# Patient Record
Sex: Female | Born: 1950
Health system: Southern US, Community
[De-identification: ages and names within clinical notes are randomized; demographics above are authoritative.]

## PROBLEM LIST (undated history)

## (undated) DIAGNOSIS — Z803 Family history of malignant neoplasm of breast: Secondary | ICD-10-CM

## (undated) DIAGNOSIS — Z860100 Personal history of colon polyps, unspecified: Secondary | ICD-10-CM

## (undated) DIAGNOSIS — Z8489 Family history of other specified conditions: Secondary | ICD-10-CM

## (undated) DIAGNOSIS — Z7901 Long term (current) use of anticoagulants: Secondary | ICD-10-CM

## (undated) DIAGNOSIS — Z923 Personal history of irradiation: Secondary | ICD-10-CM

## (undated) DIAGNOSIS — I10 Essential (primary) hypertension: Secondary | ICD-10-CM

## (undated) DIAGNOSIS — I2510 Atherosclerotic heart disease of native coronary artery without angina pectoris: Secondary | ICD-10-CM

## (undated) DIAGNOSIS — C801 Malignant (primary) neoplasm, unspecified: Secondary | ICD-10-CM

## (undated) DIAGNOSIS — Z86718 Personal history of other venous thrombosis and embolism: Secondary | ICD-10-CM

## (undated) DIAGNOSIS — D649 Anemia, unspecified: Secondary | ICD-10-CM

## (undated) DIAGNOSIS — I359 Nonrheumatic aortic valve disorder, unspecified: Secondary | ICD-10-CM

## (undated) DIAGNOSIS — R011 Cardiac murmur, unspecified: Secondary | ICD-10-CM

## (undated) DIAGNOSIS — I509 Heart failure, unspecified: Secondary | ICD-10-CM

## (undated) DIAGNOSIS — E785 Hyperlipidemia, unspecified: Secondary | ICD-10-CM

## (undated) DIAGNOSIS — I739 Peripheral vascular disease, unspecified: Secondary | ICD-10-CM

## (undated) DIAGNOSIS — Z8049 Family history of malignant neoplasm of other genital organs: Secondary | ICD-10-CM

## (undated) DIAGNOSIS — M1990 Unspecified osteoarthritis, unspecified site: Secondary | ICD-10-CM

## (undated) DIAGNOSIS — Z9221 Personal history of antineoplastic chemotherapy: Secondary | ICD-10-CM

## (undated) DIAGNOSIS — I428 Other cardiomyopathies: Secondary | ICD-10-CM

## (undated) DIAGNOSIS — Z800 Family history of malignant neoplasm of digestive organs: Secondary | ICD-10-CM

## (undated) HISTORY — DX: Personal history of colon polyps, unspecified: Z86.0100

## (undated) HISTORY — DX: Family history of malignant neoplasm of digestive organs: Z80.0

## (undated) HISTORY — PX: OTHER SURGICAL HISTORY: SHX169

## (undated) HISTORY — DX: Other cardiomyopathies: I42.8

## (undated) HISTORY — DX: Hyperlipidemia, unspecified: E78.5

## (undated) HISTORY — DX: Personal history of other venous thrombosis and embolism: Z86.718

## (undated) HISTORY — DX: Personal history of colonic polyps: Z86.010

## (undated) HISTORY — DX: Nonrheumatic aortic valve disorder, unspecified: I35.9

## (undated) HISTORY — PX: DILATION AND CURETTAGE OF UTERUS: SHX78

## (undated) HISTORY — DX: Long term (current) use of anticoagulants: Z79.01

## (undated) HISTORY — DX: Family history of malignant neoplasm of other genital organs: Z80.49

## (undated) HISTORY — DX: Essential (primary) hypertension: I10

## (undated) HISTORY — PX: CARDIAC CATHETERIZATION: SHX172

## (undated) HISTORY — PX: COLON SURGERY: SHX602

## (undated) HISTORY — PX: ABDOMINAL HYSTERECTOMY: SHX81

## (undated) HISTORY — DX: Family history of malignant neoplasm of breast: Z80.3

---

## 2002-10-02 HISTORY — PX: CARDIAC CATHETERIZATION: SHX172

## 2002-12-04 ENCOUNTER — Encounter: Payer: Self-pay | Admitting: Emergency Medicine

## 2002-12-05 ENCOUNTER — Inpatient Hospital Stay (HOSPITAL_COMMUNITY): Admission: EM | Admit: 2002-12-05 | Discharge: 2002-12-07 | Payer: Self-pay | Admitting: Emergency Medicine

## 2002-12-05 ENCOUNTER — Encounter: Payer: Self-pay | Admitting: Cardiology

## 2002-12-05 ENCOUNTER — Encounter: Payer: Self-pay | Admitting: Internal Medicine

## 2002-12-15 ENCOUNTER — Encounter: Payer: Self-pay | Admitting: *Deleted

## 2002-12-15 ENCOUNTER — Encounter: Admission: RE | Admit: 2002-12-15 | Discharge: 2002-12-15 | Payer: Self-pay | Admitting: *Deleted

## 2002-12-16 ENCOUNTER — Ambulatory Visit (HOSPITAL_COMMUNITY): Admission: RE | Admit: 2002-12-16 | Discharge: 2002-12-16 | Payer: Self-pay | Admitting: *Deleted

## 2003-08-14 ENCOUNTER — Encounter: Admission: RE | Admit: 2003-08-14 | Discharge: 2003-08-14 | Payer: Self-pay | Admitting: Obstetrics and Gynecology

## 2003-12-30 ENCOUNTER — Encounter: Payer: Self-pay | Admitting: Internal Medicine

## 2004-03-11 ENCOUNTER — Encounter (INDEPENDENT_AMBULATORY_CARE_PROVIDER_SITE_OTHER): Payer: Self-pay | Admitting: *Deleted

## 2004-03-11 ENCOUNTER — Ambulatory Visit (HOSPITAL_COMMUNITY): Admission: RE | Admit: 2004-03-11 | Discharge: 2004-03-11 | Payer: Self-pay | Admitting: Gastroenterology

## 2004-11-06 ENCOUNTER — Emergency Department (HOSPITAL_COMMUNITY): Admission: EM | Admit: 2004-11-06 | Discharge: 2004-11-06 | Payer: Self-pay | Admitting: Family Medicine

## 2007-06-26 ENCOUNTER — Ambulatory Visit: Payer: Self-pay | Admitting: Internal Medicine

## 2007-06-26 DIAGNOSIS — J45909 Unspecified asthma, uncomplicated: Secondary | ICD-10-CM | POA: Insufficient documentation

## 2007-06-26 DIAGNOSIS — R519 Headache, unspecified: Secondary | ICD-10-CM | POA: Insufficient documentation

## 2007-06-26 DIAGNOSIS — R51 Headache: Secondary | ICD-10-CM

## 2007-06-26 DIAGNOSIS — I10 Essential (primary) hypertension: Secondary | ICD-10-CM

## 2007-06-26 DIAGNOSIS — Z8679 Personal history of other diseases of the circulatory system: Secondary | ICD-10-CM | POA: Insufficient documentation

## 2007-06-26 DIAGNOSIS — I509 Heart failure, unspecified: Secondary | ICD-10-CM | POA: Insufficient documentation

## 2007-06-26 DIAGNOSIS — R55 Syncope and collapse: Secondary | ICD-10-CM | POA: Insufficient documentation

## 2007-06-26 DIAGNOSIS — I110 Hypertensive heart disease with heart failure: Secondary | ICD-10-CM

## 2007-06-26 DIAGNOSIS — J309 Allergic rhinitis, unspecified: Secondary | ICD-10-CM | POA: Insufficient documentation

## 2007-06-26 DIAGNOSIS — Z86718 Personal history of other venous thrombosis and embolism: Secondary | ICD-10-CM | POA: Insufficient documentation

## 2007-07-01 ENCOUNTER — Ambulatory Visit: Payer: Self-pay | Admitting: Internal Medicine

## 2007-07-01 LAB — CONVERTED CEMR LAB
INR: 1.5
Prothrombin Time: 15.1 s

## 2007-07-03 LAB — CONVERTED CEMR LAB
BUN: 14 mg/dL (ref 6–23)
Basophils Absolute: 0 10*3/uL (ref 0.0–0.1)
Basophils Relative: 0.4 % (ref 0.0–1.0)
CO2: 29 meq/L (ref 19–32)
Calcium: 9.1 mg/dL (ref 8.4–10.5)
Chloride: 112 meq/L (ref 96–112)
Cholesterol: 247 mg/dL (ref 0–200)
Creatinine, Ser: 1.2 mg/dL (ref 0.4–1.2)
Direct LDL: 175.9 mg/dL
Eosinophils Absolute: 0.1 10*3/uL (ref 0.0–0.6)
Eosinophils Relative: 0.8 % (ref 0.0–5.0)
GFR calc Af Amer: 60 mL/min
GFR calc non Af Amer: 50 mL/min
Glucose, Bld: 89 mg/dL (ref 70–99)
HCT: 40.1 % (ref 36.0–46.0)
HDL: 43.7 mg/dL (ref >39.0)
Hemoglobin: 13.8 g/dL (ref 12.0–15.0)
Lymphocytes Relative: 51.2 % — ABNORMAL HIGH (ref 12.0–46.0)
MCHC: 34.3 g/dL (ref 30.0–36.0)
MCV: 86.4 fL (ref 78.0–100.0)
Monocytes Absolute: 0.3 10*3/uL (ref 0.2–0.7)
Monocytes Relative: 4.8 % (ref 3.0–11.0)
Neutro Abs: 3.1 10*3/uL (ref 1.4–7.7)
Neutrophils Relative %: 42.8 % — ABNORMAL LOW (ref 43.0–77.0)
Platelets: 202 10*3/uL (ref 150–400)
Potassium: 3.8 meq/L (ref 3.5–5.1)
RBC: 4.65 M/uL (ref 3.87–5.11)
RDW: 12.7 % (ref 11.5–14.6)
Sodium: 147 meq/L — ABNORMAL HIGH (ref 135–145)
TSH: 1.82 microintl units/mL (ref 0.35–5.50)
Total CHOL/HDL Ratio: 5.7
Triglycerides: 137 mg/dL (ref 0–149)
VLDL: 27 mg/dL (ref 0–40)
WBC: 7.2 10*3/uL (ref 4.5–10.5)

## 2007-07-10 ENCOUNTER — Ambulatory Visit: Payer: Self-pay | Admitting: Internal Medicine

## 2007-07-10 LAB — CONVERTED CEMR LAB
INR: 3
Prothrombin Time: 20.9 s

## 2007-07-18 ENCOUNTER — Ambulatory Visit: Payer: Self-pay | Admitting: Internal Medicine

## 2007-07-18 DIAGNOSIS — I119 Hypertensive heart disease without heart failure: Secondary | ICD-10-CM | POA: Insufficient documentation

## 2007-07-18 DIAGNOSIS — E782 Mixed hyperlipidemia: Secondary | ICD-10-CM | POA: Insufficient documentation

## 2007-07-18 DIAGNOSIS — I43 Cardiomyopathy in diseases classified elsewhere: Secondary | ICD-10-CM

## 2007-07-19 ENCOUNTER — Telehealth: Payer: Self-pay | Admitting: *Deleted

## 2007-07-24 ENCOUNTER — Ambulatory Visit: Payer: Self-pay | Admitting: Internal Medicine

## 2007-07-24 LAB — CONVERTED CEMR LAB
INR: 2.3
Prothrombin Time: 18.5 s

## 2007-07-25 ENCOUNTER — Ambulatory Visit: Payer: Self-pay

## 2007-07-25 ENCOUNTER — Encounter: Payer: Self-pay | Admitting: Internal Medicine

## 2007-07-30 ENCOUNTER — Ambulatory Visit: Payer: Self-pay | Admitting: Cardiology

## 2007-07-30 LAB — CONVERTED CEMR LAB
BUN: 13 mg/dL (ref 6–23)
CO2: 26 meq/L (ref 19–32)
Calcium: 8.9 mg/dL (ref 8.4–10.5)
Chloride: 107 meq/L (ref 96–112)
Creatinine, Ser: 1.1 mg/dL (ref 0.4–1.2)
GFR calc Af Amer: 66 mL/min
GFR calc non Af Amer: 55 mL/min
Glucose, Bld: 81 mg/dL (ref 70–99)
Potassium: 3.3 meq/L — ABNORMAL LOW (ref 3.5–5.1)
Sodium: 141 meq/L (ref 135–145)

## 2007-08-07 ENCOUNTER — Ambulatory Visit: Payer: Self-pay | Admitting: Cardiology

## 2007-08-07 ENCOUNTER — Ambulatory Visit: Payer: Self-pay

## 2007-08-07 IMAGING — CT CT ANGIO CHEST
1 of 5 series · 12 of 36 positions shown · IV contrast (Omnipaque 300)
Comparison: none

CLINICAL DATA: Chest pain. Aortic regurgitation.

[Series 2: thoracica_wo 3.0 b30f st · axial · 0.71mm/px · z∈[-289,-61]mm · 12 of 90 slices shown]
[im 7/90  lung]
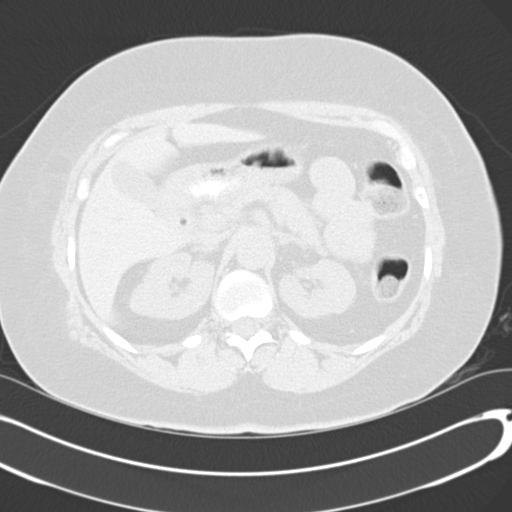
[im 14/90  mediastinal]
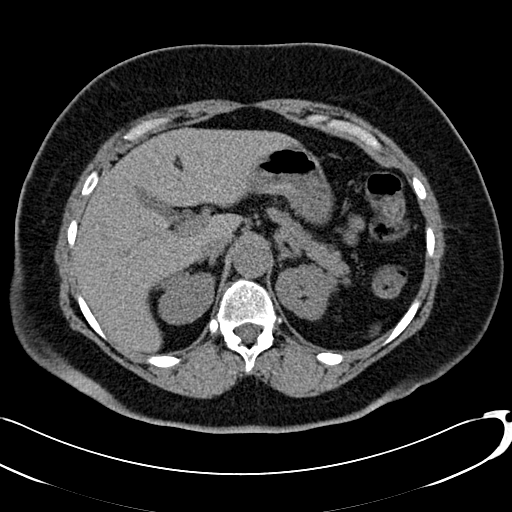
[im 21/90  lung]
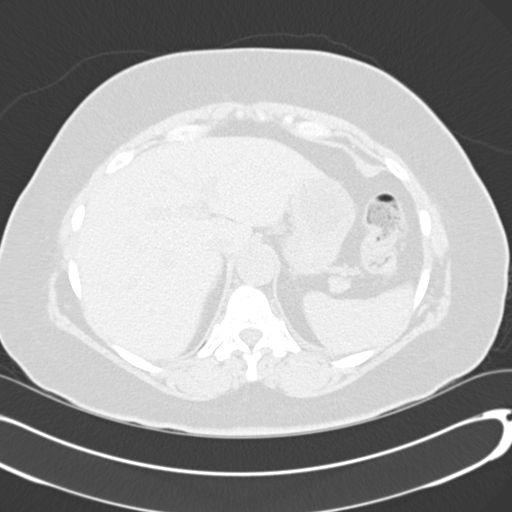
[im 28/90  mediastinal]
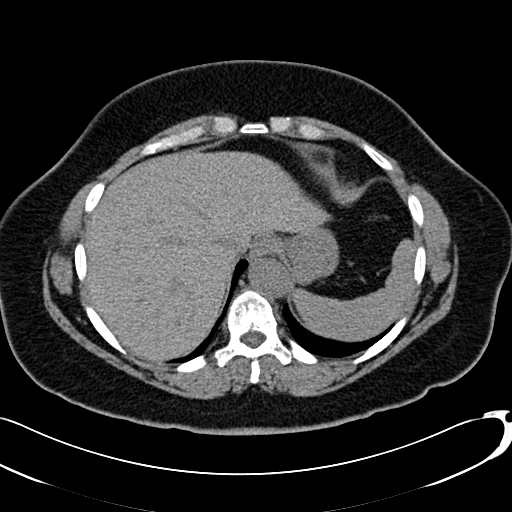
[im 35/90  lung]
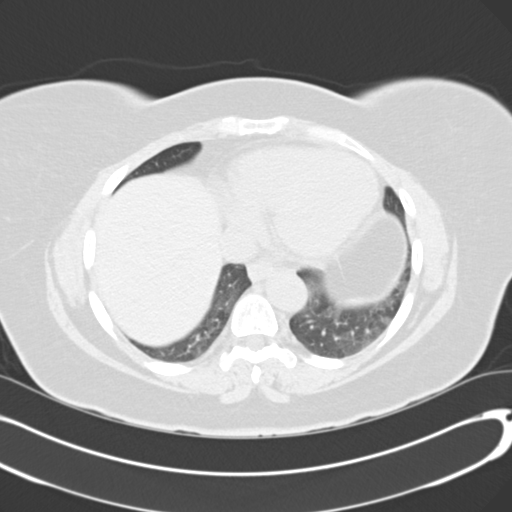
[im 42/90  mediastinal]
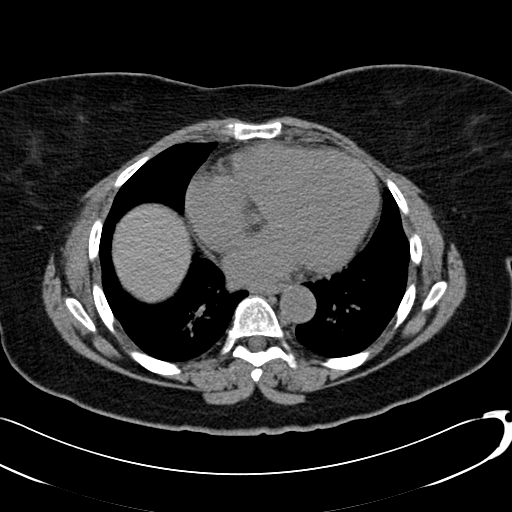
[im 48/90  lung]
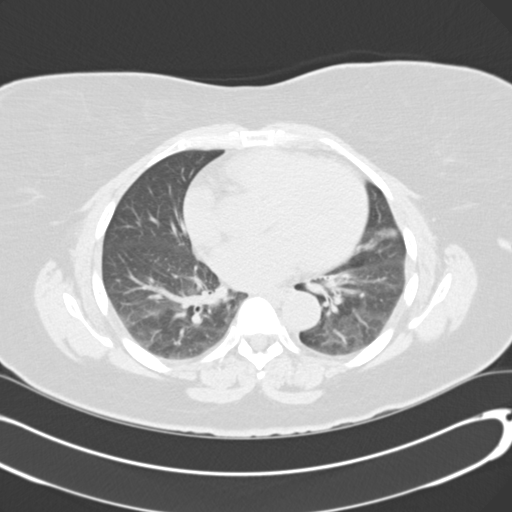
[im 55/90  mediastinal]
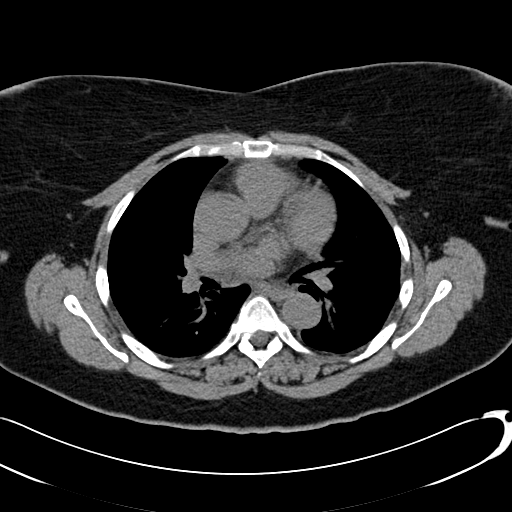
[im 62/90  lung]
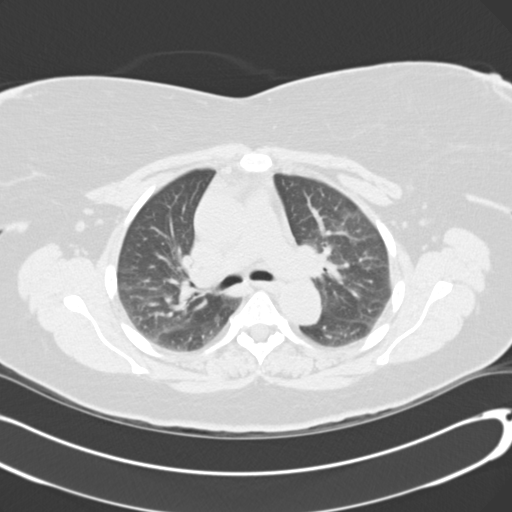
[im 69/90  mediastinal]
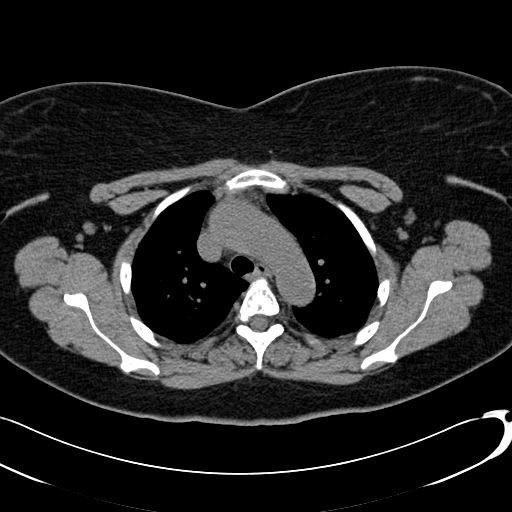
[im 76/90  lung]
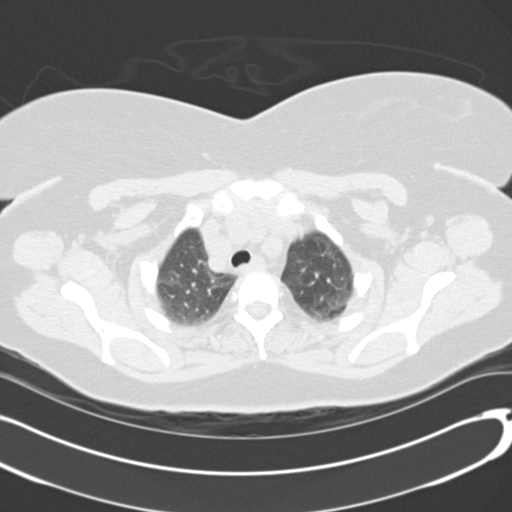
[im 83/90  mediastinal]
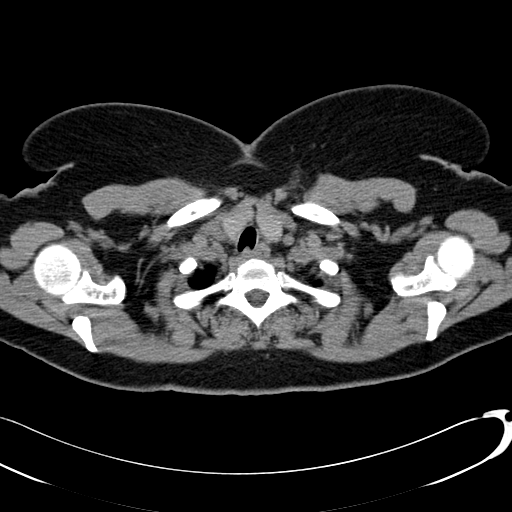

[12 of 36 positions shown; findings below may reference images not displayed]

CT angiogram chest with contrast:

Multidetector helical CT of the chest was obtained after 100 ml Omnipaque 300 
IV. CT multiplanar reconstructions were rendered to evaluate the vascular
anatomy.
Comparison CT chest [DATE] by report only. 
Noncontrast scan shows no pleural or pericardial effusion. Minimal
calcifications in the aortic arch.
The CTA shows good contrast opacification of pulmonary artery branches. No
filling defects to suggest acute PE. There is good contrast opacification of the
thoracic aorta, which is unremarkable. Negative for dissection, aneurysm, or
stenosis. There is tortuosity of the proximal   brachiocephalic vessels without
stenosis. There is mild left ventricular enlargement. Patchy groundglass
opacities in the posterior aspect of both lungs, probably related to dependent
atelectasis. Lungs are otherwise clear.
IMPRESSION: 1. Negative for acute PE, thoracic aortic dissection or aneurysm.

## 2007-08-20 ENCOUNTER — Ambulatory Visit: Payer: Self-pay | Admitting: Internal Medicine

## 2007-08-20 DIAGNOSIS — E876 Hypokalemia: Secondary | ICD-10-CM

## 2007-08-20 DIAGNOSIS — E875 Hyperkalemia: Secondary | ICD-10-CM | POA: Insufficient documentation

## 2007-08-20 LAB — CONVERTED CEMR LAB
CRP, High Sensitivity: 7 — ABNORMAL HIGH
INR: 1.8
Prothrombin Time: 16.7 s

## 2007-08-23 LAB — CONVERTED CEMR LAB
BUN: 20 mg/dL (ref 6–23)
CO2: 27 meq/L (ref 19–32)
Calcium: 9.6 mg/dL (ref 8.4–10.5)
Chloride: 104 meq/L (ref 96–112)
Creatinine, Ser: 1.2 mg/dL (ref 0.4–1.2)
GFR calc Af Amer: 60 mL/min
GFR calc non Af Amer: 50 mL/min
Glucose, Bld: 72 mg/dL (ref 70–99)
Potassium: 3.8 meq/L (ref 3.5–5.1)
Sodium: 143 meq/L (ref 135–145)

## 2007-09-04 ENCOUNTER — Ambulatory Visit: Payer: Self-pay | Admitting: Internal Medicine

## 2007-09-04 LAB — CONVERTED CEMR LAB
INR: 3.3
Prothrombin Time: 21.8 s

## 2007-09-10 ENCOUNTER — Ambulatory Visit: Payer: Self-pay | Admitting: Internal Medicine

## 2007-09-10 LAB — CONVERTED CEMR LAB
INR: 3.1
Prothrombin Time: 21.3 s

## 2007-09-24 ENCOUNTER — Ambulatory Visit: Payer: Self-pay | Admitting: Internal Medicine

## 2007-09-24 LAB — CONVERTED CEMR LAB
Bilirubin Urine: NEGATIVE
Blood in Urine, dipstick: NEGATIVE
Glucose, Urine, Semiquant: NEGATIVE
INR: 2.9
Ketones, urine, test strip: NEGATIVE
Nitrite: NEGATIVE
Protein, U semiquant: NEGATIVE
Prothrombin Time: 20.6 s
Urobilinogen, UA: NEGATIVE
WBC Urine, dipstick: NEGATIVE

## 2007-10-18 ENCOUNTER — Ambulatory Visit: Payer: Self-pay | Admitting: Internal Medicine

## 2007-10-22 LAB — CONVERTED CEMR LAB
ALT: 28 units/L (ref 0–35)
AST: 24 units/L (ref 0–37)
Albumin: 4 g/dL (ref 3.5–5.2)
Alkaline Phosphatase: 115 units/L (ref 39–117)
Bilirubin, Direct: 0.1 mg/dL (ref 0.0–0.3)
Cholesterol: 155 mg/dL (ref 0–200)
HDL: 41.4 mg/dL (ref >39.0)
LDL Cholesterol: 88 mg/dL (ref 0–99)
Total Bilirubin: 0.6 mg/dL (ref 0.3–1.2)
Total CHOL/HDL Ratio: 3.7
Total Protein: 7.1 g/dL (ref 6.0–8.3)
Triglycerides: 130 mg/dL (ref 0–149)
VLDL: 26 mg/dL (ref 0–40)

## 2007-10-25 ENCOUNTER — Ambulatory Visit: Payer: Self-pay | Admitting: Internal Medicine

## 2007-10-25 DIAGNOSIS — J069 Acute upper respiratory infection, unspecified: Secondary | ICD-10-CM | POA: Insufficient documentation

## 2007-10-25 LAB — CONVERTED CEMR LAB
Cholesterol, target level: 200 mg/dL
HDL goal, serum: 40 mg/dL
INR: 3.1
LDL Goal: 130 mg/dL
Prothrombin Time: 21.1 s

## 2007-11-06 ENCOUNTER — Telehealth: Payer: Self-pay | Admitting: Internal Medicine

## 2007-11-22 ENCOUNTER — Ambulatory Visit: Payer: Self-pay | Admitting: Internal Medicine

## 2007-11-22 LAB — CONVERTED CEMR LAB
INR: 3.9
Prothrombin Time: 24 s

## 2007-12-09 ENCOUNTER — Ambulatory Visit: Payer: Self-pay | Admitting: Internal Medicine

## 2007-12-09 LAB — CONVERTED CEMR LAB
INR: 4
Prothrombin Time: 24.2 s

## 2007-12-23 ENCOUNTER — Ambulatory Visit: Payer: Self-pay | Admitting: Internal Medicine

## 2007-12-23 LAB — CONVERTED CEMR LAB
INR: 3.2
Prothrombin Time: 21.7 s

## 2008-01-06 ENCOUNTER — Ambulatory Visit: Payer: Self-pay | Admitting: Internal Medicine

## 2008-01-06 LAB — CONVERTED CEMR LAB
INR: 3.3
Prothrombin Time: 21.9 s

## 2008-01-22 ENCOUNTER — Ambulatory Visit: Payer: Self-pay | Admitting: Cardiology

## 2008-01-22 LAB — CONVERTED CEMR LAB
BUN: 9 mg/dL (ref 6–23)
CO2: 32 meq/L (ref 19–32)
Calcium: 9.2 mg/dL (ref 8.4–10.5)
Chloride: 103 meq/L (ref 96–112)
Creatinine, Ser: 1 mg/dL (ref 0.4–1.2)
GFR calc Af Amer: 74 mL/min
GFR calc non Af Amer: 61 mL/min
Glucose, Bld: 100 mg/dL — ABNORMAL HIGH (ref 70–99)
Potassium: 4 meq/L (ref 3.5–5.1)
Sodium: 143 meq/L (ref 135–145)

## 2008-01-27 ENCOUNTER — Ambulatory Visit: Payer: Self-pay | Admitting: Cardiology

## 2008-01-27 ENCOUNTER — Ambulatory Visit: Payer: Self-pay | Admitting: Internal Medicine

## 2008-01-27 LAB — CONVERTED CEMR LAB
INR: 4.3
Prothrombin Time: 25.3 s

## 2008-02-10 ENCOUNTER — Ambulatory Visit: Payer: Self-pay | Admitting: Internal Medicine

## 2008-02-10 LAB — CONVERTED CEMR LAB
INR: 3.8
Prothrombin Time: 23.5 s

## 2008-02-12 ENCOUNTER — Telehealth: Payer: Self-pay | Admitting: Internal Medicine

## 2008-02-13 ENCOUNTER — Ambulatory Visit: Payer: Self-pay | Admitting: Internal Medicine

## 2008-02-13 DIAGNOSIS — M25579 Pain in unspecified ankle and joints of unspecified foot: Secondary | ICD-10-CM | POA: Insufficient documentation

## 2008-02-13 IMAGING — CR DG ANKLE COMPLETE 3+V*L*
3 series · 3 of 3 positions shown · non-contrast
Comparison: None

CLINICAL DATA: Left ankle pain for several weeks

LEFT ANKLE COMPLETE - 3+ VIEW

[view not recorded (1 of 3)]
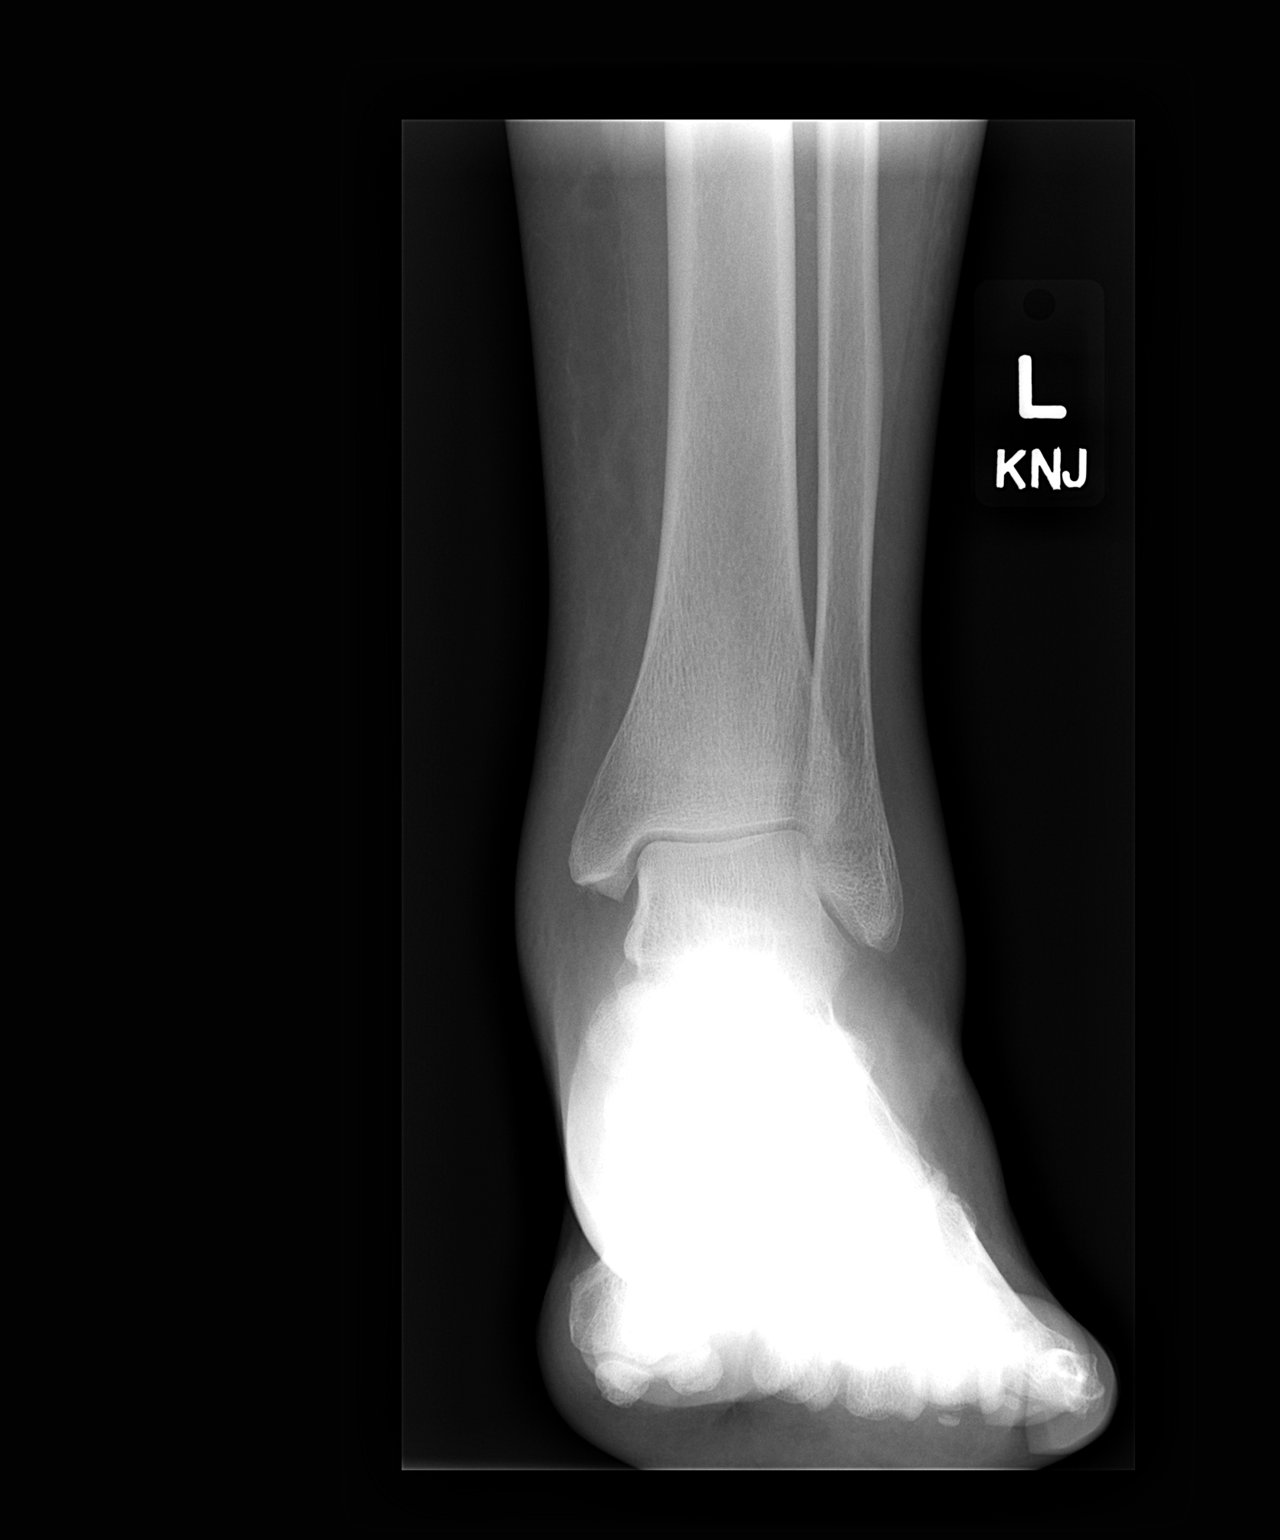

[view not recorded (2 of 3)]
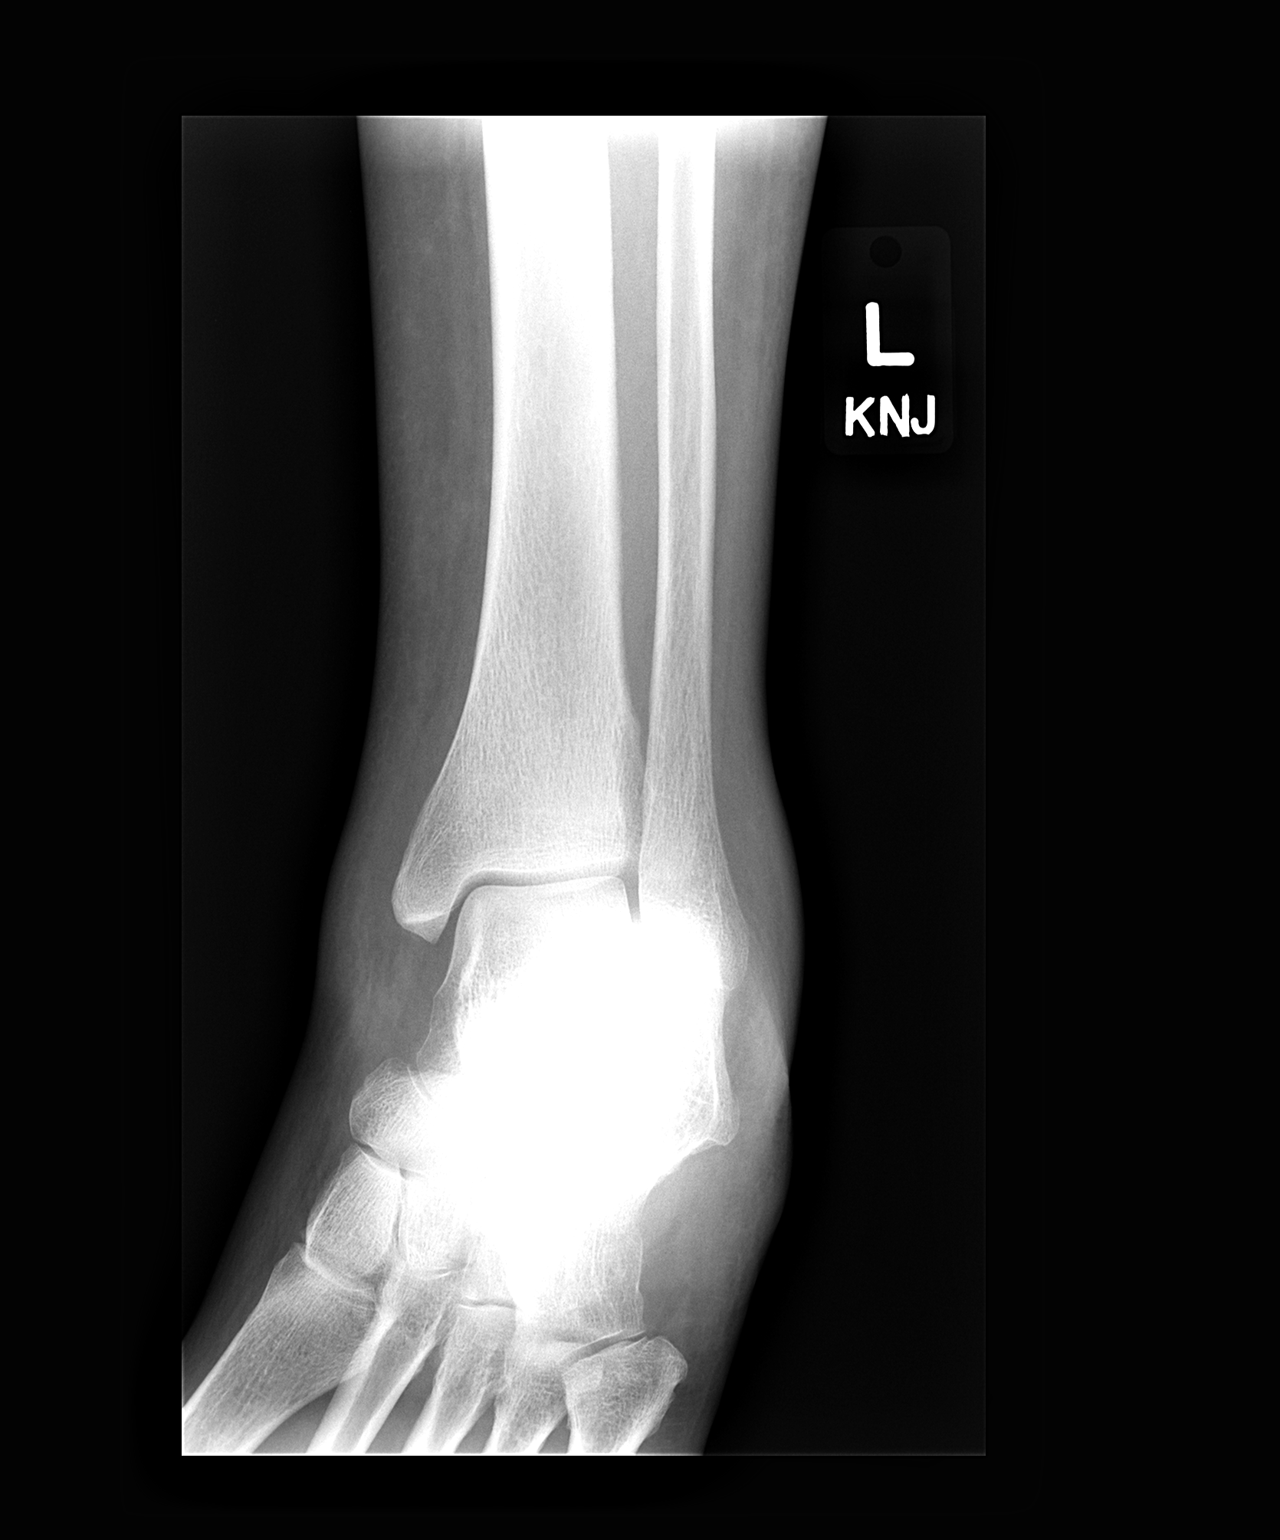

[view not recorded (3 of 3)]
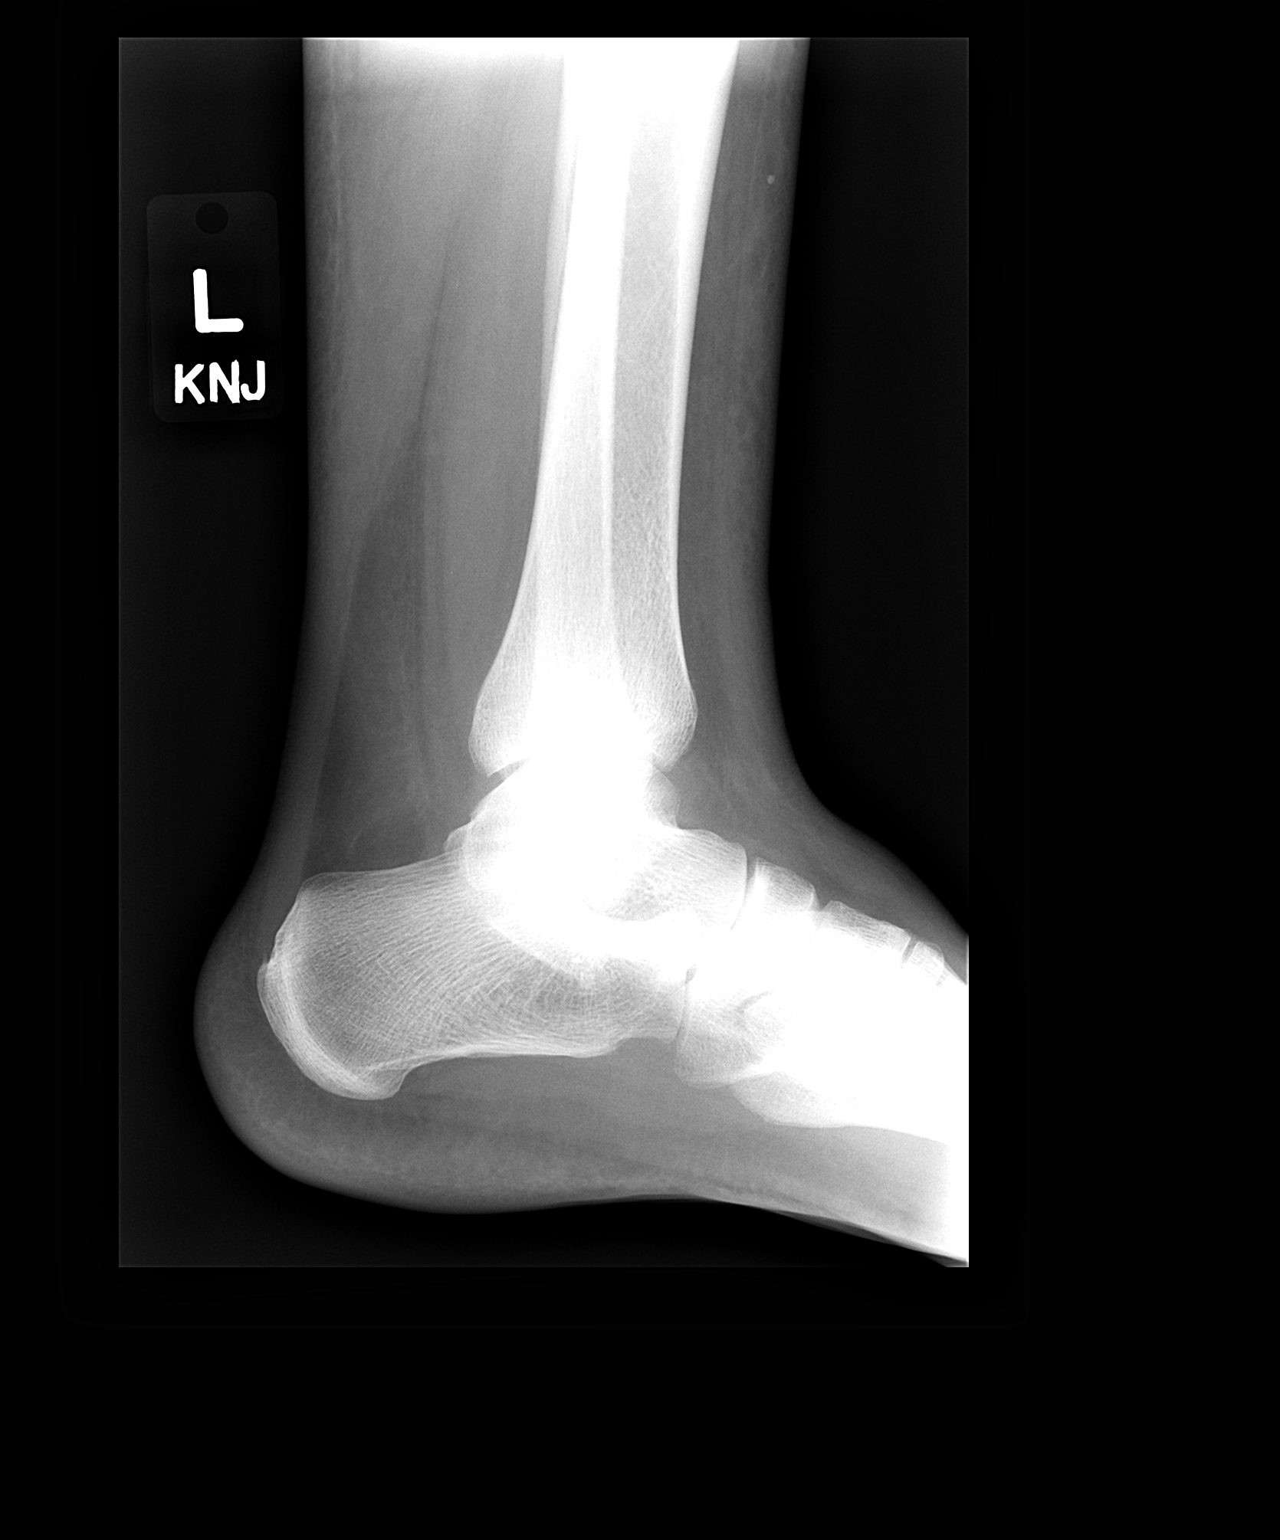

[3 of 3 positions shown; findings below may reference images not displayed]

FINDINGS: No acute bony abnormality is seen.  The ankle joint
appears normal.  Slightly prominent soft tissues are noted
diffusely
IMPRESSION: No acute bony abnormality.

## 2008-02-21 ENCOUNTER — Ambulatory Visit: Payer: Self-pay | Admitting: Internal Medicine

## 2008-02-21 DIAGNOSIS — J019 Acute sinusitis, unspecified: Secondary | ICD-10-CM

## 2008-02-25 ENCOUNTER — Ambulatory Visit: Payer: Self-pay | Admitting: Internal Medicine

## 2008-02-25 LAB — CONVERTED CEMR LAB
INR: 2.5
Prothrombin Time: 19.2 s

## 2008-03-03 ENCOUNTER — Encounter: Admission: RE | Admit: 2008-03-03 | Discharge: 2008-03-03 | Payer: Self-pay | Admitting: Internal Medicine

## 2008-03-03 IMAGING — MG MM SCREEN MAMMOGRAM BILATERAL
4 series · 4 of 4 positions shown · non-contrast
Comparison: none

DG SCREEN MAMMOGRAM BILATERAL
Bilateral CC and MLO view(s) were taken.

DIGITAL SCREENING MAMMOGRAM WITH CAD:
The breast tissue is almost entirely fatty.  No masses or malignant type calcifications are 
identified.  Compared with prior studies.

[R CC]
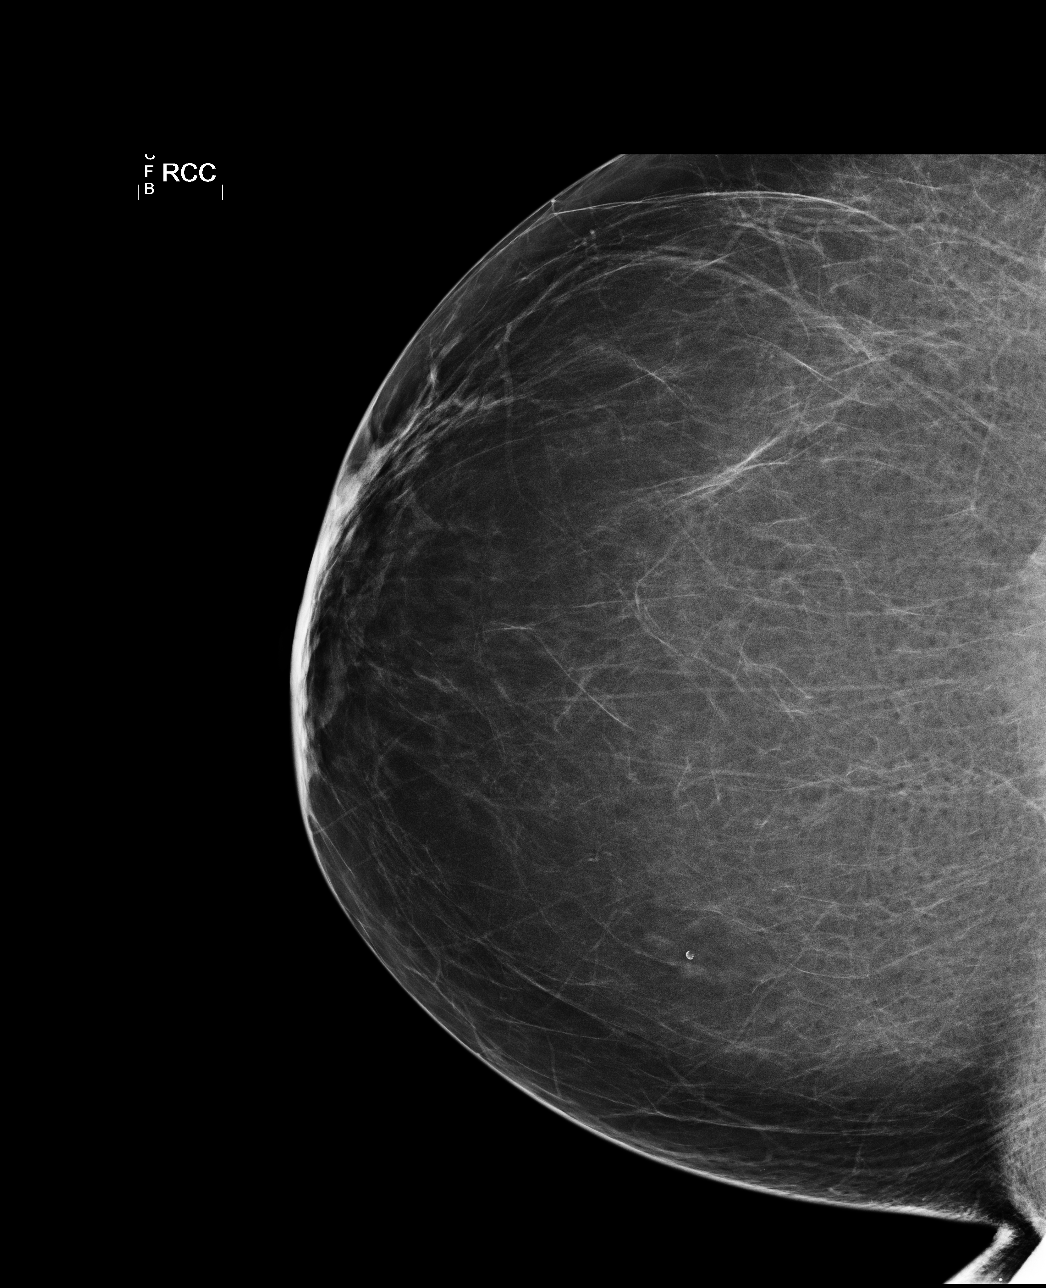

[L CC]
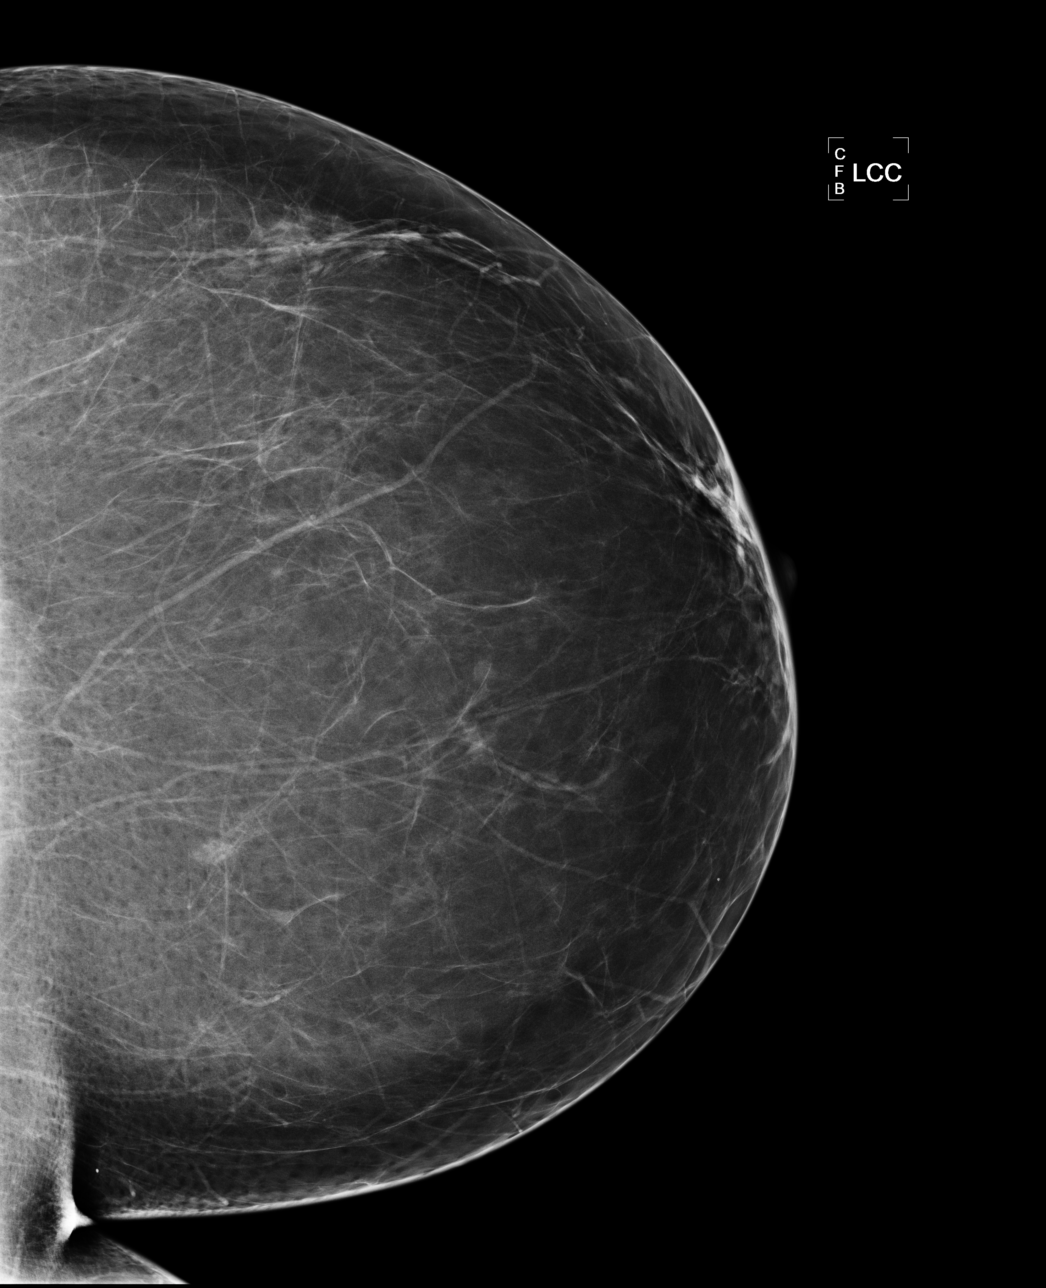

[L MLO]
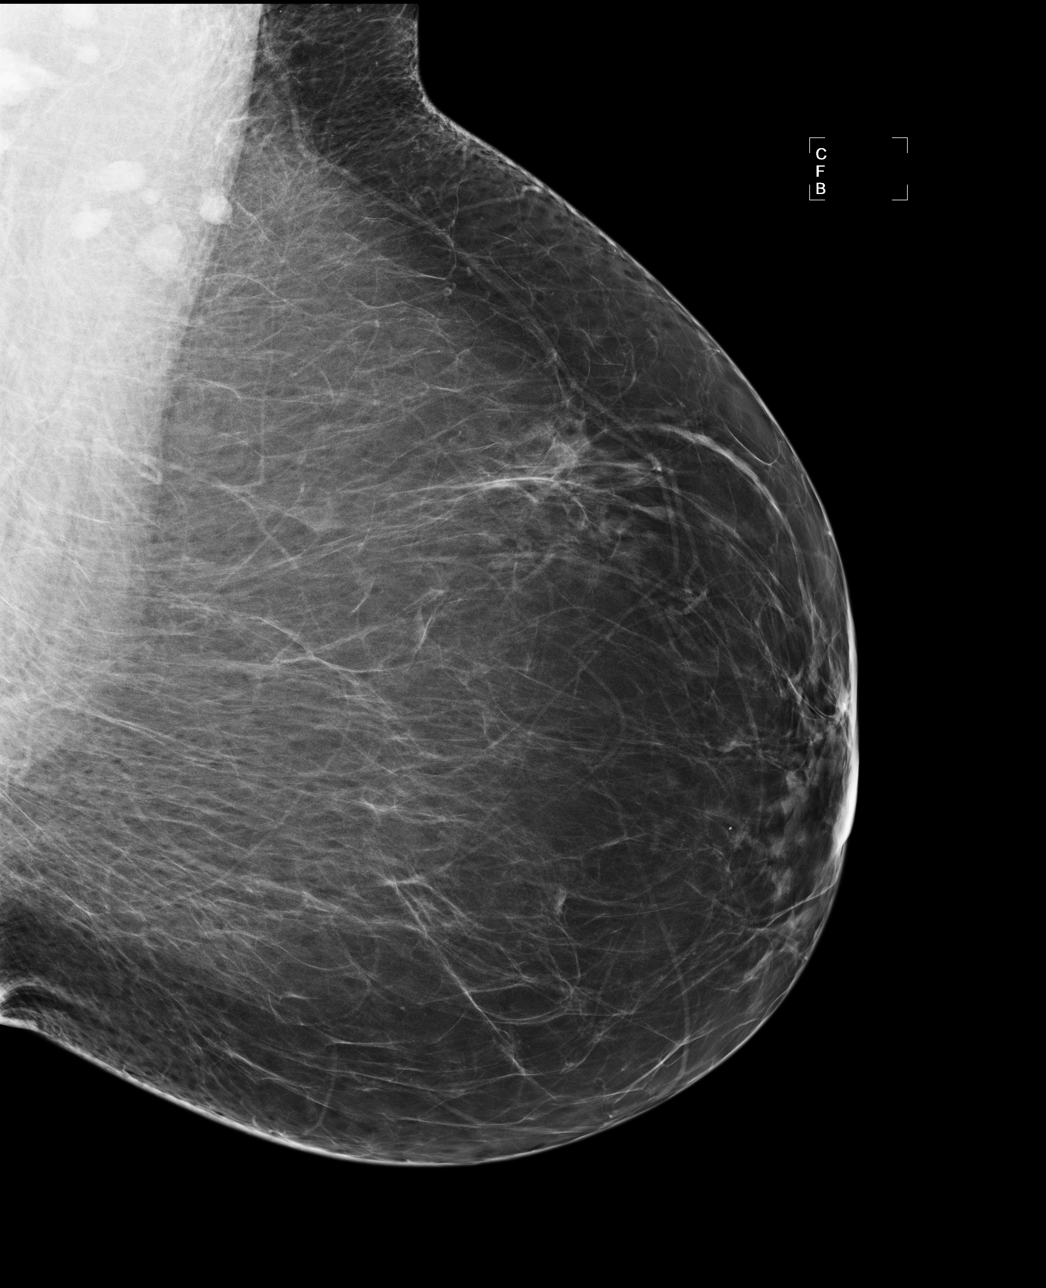

[R MLO]
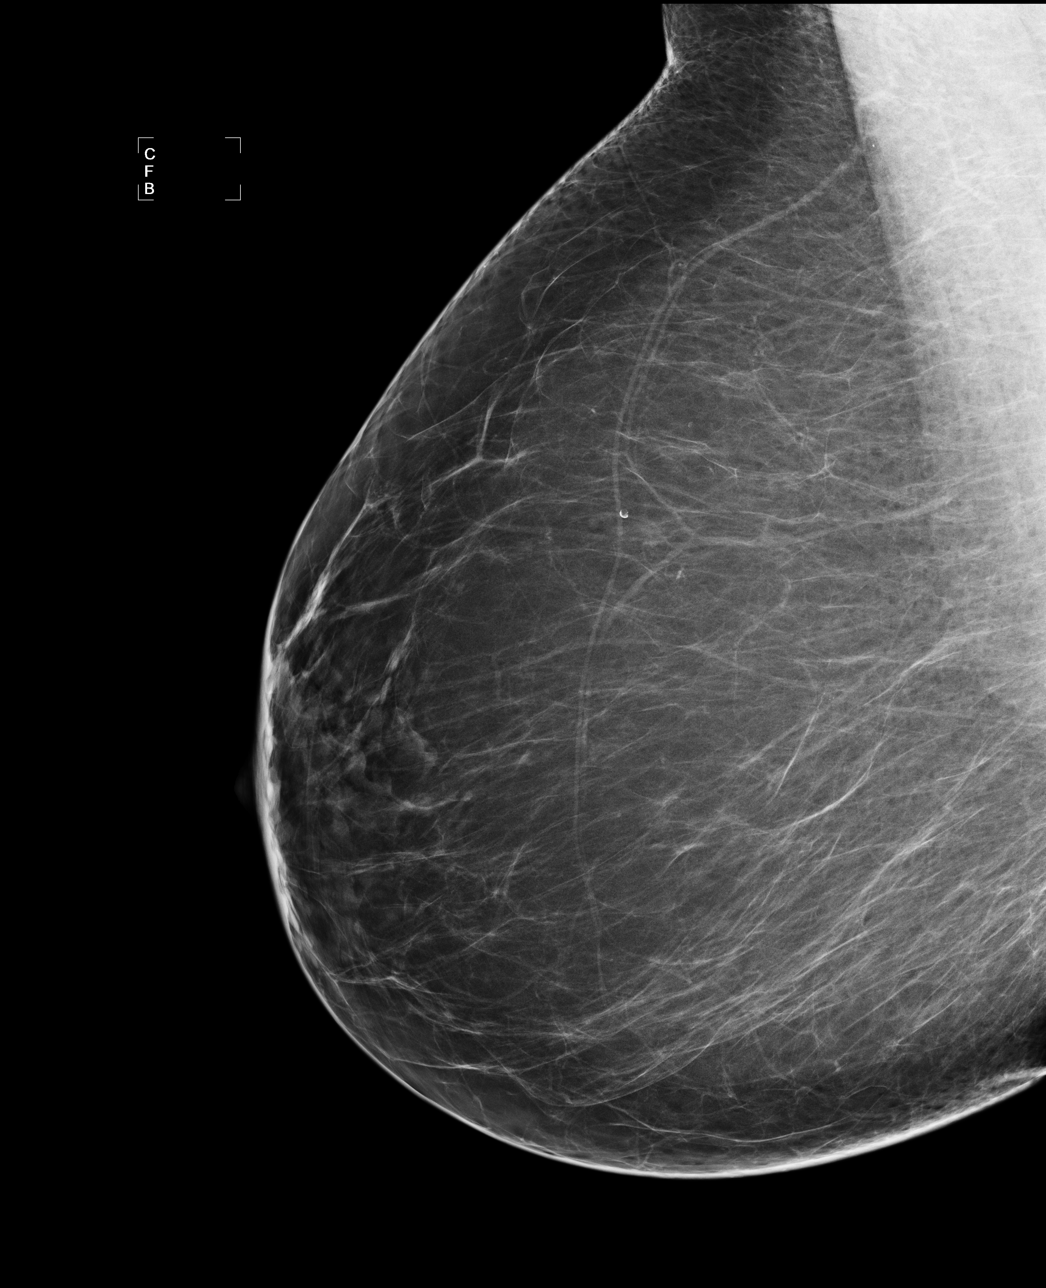

[4 of 4 positions shown; findings below may reference images not displayed]

IMPRESSION: No specific mammographic evidence of malignancy.  Next screening mammogram is recommended in one 
year.

ASSESSMENT: Negative - BI-RADS 1

Screening mammogram in 1 year.
ANALYZED BY COMPUTER AIDED DETECTION. , THIS PROCEDURE WAS A DIGITAL MAMMOGRAM.

## 2008-03-24 ENCOUNTER — Ambulatory Visit: Payer: Self-pay | Admitting: Internal Medicine

## 2008-03-24 LAB — CONVERTED CEMR LAB
INR: 1.7
Prothrombin Time: 16.1 s

## 2008-04-08 ENCOUNTER — Ambulatory Visit: Payer: Self-pay | Admitting: Internal Medicine

## 2008-04-08 LAB — CONVERTED CEMR LAB
INR: 1.5
Prothrombin Time: 15.2 s

## 2008-04-21 ENCOUNTER — Ambulatory Visit: Payer: Self-pay | Admitting: Internal Medicine

## 2008-04-21 LAB — CONVERTED CEMR LAB
INR: 2.1
Prothrombin Time: 17.7 s

## 2008-04-28 ENCOUNTER — Telehealth: Payer: Self-pay | Admitting: Internal Medicine

## 2008-05-19 ENCOUNTER — Ambulatory Visit: Payer: Self-pay | Admitting: Internal Medicine

## 2008-05-19 LAB — CONVERTED CEMR LAB
INR: 1.7
Prothrombin Time: 15.9 s

## 2008-06-19 ENCOUNTER — Ambulatory Visit: Payer: Self-pay | Admitting: Internal Medicine

## 2008-06-19 DIAGNOSIS — R609 Edema, unspecified: Secondary | ICD-10-CM

## 2008-06-19 DIAGNOSIS — M25569 Pain in unspecified knee: Secondary | ICD-10-CM

## 2008-06-19 LAB — CONVERTED CEMR LAB
INR: 3.3
Prothrombin Time: 21.8 s

## 2008-07-14 ENCOUNTER — Telehealth: Payer: Self-pay | Admitting: Family Medicine

## 2008-07-17 ENCOUNTER — Ambulatory Visit: Payer: Self-pay | Admitting: Cardiology

## 2008-07-20 ENCOUNTER — Encounter: Payer: Self-pay | Admitting: Internal Medicine

## 2008-07-23 ENCOUNTER — Encounter: Payer: Self-pay | Admitting: Cardiology

## 2008-07-23 ENCOUNTER — Ambulatory Visit: Payer: Self-pay | Admitting: Cardiology

## 2008-07-23 ENCOUNTER — Ambulatory Visit: Payer: Self-pay

## 2008-07-23 LAB — CONVERTED CEMR LAB
BUN: 15 mg/dL (ref 6–23)
CO2: 32 meq/L (ref 19–32)
Calcium: 8.6 mg/dL (ref 8.4–10.5)
Chloride: 107 meq/L (ref 96–112)
Creatinine, Ser: 1.1 mg/dL (ref 0.4–1.2)
GFR calc Af Amer: 66 mL/min
GFR calc non Af Amer: 55 mL/min
Glucose, Bld: 86 mg/dL (ref 70–99)
Potassium: 3.5 meq/L (ref 3.5–5.1)
Pro B Natriuretic peptide (BNP): 86 pg/mL (ref 0.0–100.0)
Sodium: 144 meq/L (ref 135–145)

## 2008-10-16 ENCOUNTER — Ambulatory Visit: Payer: Self-pay | Admitting: Internal Medicine

## 2008-10-16 LAB — CONVERTED CEMR LAB
ALT: 19 units/L (ref 0–35)
AST: 18 units/L (ref 0–37)
Albumin: 3.6 g/dL (ref 3.5–5.2)
Alkaline Phosphatase: 119 units/L — ABNORMAL HIGH (ref 39–117)
BUN: 13 mg/dL (ref 6–23)
Basophils Absolute: 0 10*3/uL (ref 0.0–0.1)
Basophils Relative: 0.5 % (ref 0.0–3.0)
Bilirubin, Direct: 0.1 mg/dL (ref 0.0–0.3)
CO2: 27 meq/L (ref 19–32)
Calcium: 8.8 mg/dL (ref 8.4–10.5)
Chloride: 110 meq/L (ref 96–112)
Cholesterol: 144 mg/dL (ref 0–200)
Creatinine, Ser: 1.1 mg/dL (ref 0.4–1.2)
Eosinophils Absolute: 0.1 10*3/uL (ref 0.0–0.7)
Eosinophils Relative: 1.5 % (ref 0.0–5.0)
GFR calc Af Amer: 66 mL/min
GFR calc non Af Amer: 55 mL/min
Glucose, Bld: 97 mg/dL (ref 70–99)
HCT: 37.5 % (ref 36.0–46.0)
HDL: 37.4 mg/dL — ABNORMAL LOW (ref >39.0)
Hemoglobin: 12.6 g/dL (ref 12.0–15.0)
LDL Cholesterol: 83 mg/dL (ref 0–99)
Lymphocytes Relative: 48.6 % — ABNORMAL HIGH (ref 12.0–46.0)
MCHC: 33.7 g/dL (ref 30.0–36.0)
MCV: 87.1 fL (ref 78.0–100.0)
Monocytes Absolute: 0.4 10*3/uL (ref 0.1–1.0)
Monocytes Relative: 5.4 % (ref 3.0–12.0)
Neutro Abs: 3.3 10*3/uL (ref 1.4–7.7)
Neutrophils Relative %: 44 % (ref 43.0–77.0)
Platelets: 158 10*3/uL (ref 150–400)
Potassium: 3.4 meq/L — ABNORMAL LOW (ref 3.5–5.1)
RBC: 4.3 M/uL (ref 3.87–5.11)
RDW: 13 % (ref 11.5–14.6)
Sodium: 143 meq/L (ref 135–145)
TSH: 2.7 microintl units/mL (ref 0.35–5.50)
Total Bilirubin: 0.6 mg/dL (ref 0.3–1.2)
Total CHOL/HDL Ratio: 3.9
Total Protein: 6.7 g/dL (ref 6.0–8.3)
Triglycerides: 118 mg/dL (ref 0–149)
VLDL: 24 mg/dL (ref 0–40)
WBC: 7.6 10*3/uL (ref 4.5–10.5)

## 2008-10-23 ENCOUNTER — Ambulatory Visit: Payer: Self-pay | Admitting: Internal Medicine

## 2008-10-23 LAB — CONVERTED CEMR LAB
INR: 2.2
Prothrombin Time: 18.3 s

## 2008-11-20 ENCOUNTER — Ambulatory Visit: Payer: Self-pay | Admitting: Internal Medicine

## 2008-11-20 LAB — CONVERTED CEMR LAB
INR: 2.4
Prothrombin Time: 19 s

## 2008-11-30 LAB — CONVERTED CEMR LAB
BUN: 12 mg/dL (ref 6–23)
CO2: 29 meq/L (ref 19–32)
Calcium: 9.1 mg/dL (ref 8.4–10.5)
Chloride: 106 meq/L (ref 96–112)
Creatinine, Ser: 1.1 mg/dL (ref 0.4–1.2)
GFR calc Af Amer: 66 mL/min
GFR calc non Af Amer: 55 mL/min
Glucose, Bld: 95 mg/dL (ref 70–99)
Potassium: 3.4 meq/L — ABNORMAL LOW (ref 3.5–5.1)
Sodium: 144 meq/L (ref 135–145)

## 2008-12-18 ENCOUNTER — Ambulatory Visit: Payer: Self-pay | Admitting: Internal Medicine

## 2008-12-18 LAB — CONVERTED CEMR LAB
INR: 2
Prothrombin Time: 17.5 s

## 2009-01-18 ENCOUNTER — Ambulatory Visit: Payer: Self-pay | Admitting: Internal Medicine

## 2009-01-18 LAB — CONVERTED CEMR LAB
INR: 4
Prothrombin Time: 24.3 s

## 2009-01-26 DIAGNOSIS — I82409 Acute embolism and thrombosis of unspecified deep veins of unspecified lower extremity: Secondary | ICD-10-CM | POA: Insufficient documentation

## 2009-01-27 LAB — CONVERTED CEMR LAB
BUN: 11 mg/dL (ref 6–23)
CO2: 28 meq/L (ref 19–32)
Calcium: 8.7 mg/dL (ref 8.4–10.5)
Chloride: 109 meq/L (ref 96–112)
Creatinine, Ser: 1.1 mg/dL (ref 0.4–1.2)
GFR calc non Af Amer: 65.92 mL/min (ref >60)
Glucose, Bld: 77 mg/dL (ref 70–99)
Potassium: 3.1 meq/L — ABNORMAL LOW (ref 3.5–5.1)
Sodium: 144 meq/L (ref 135–145)

## 2009-01-28 ENCOUNTER — Telehealth (INDEPENDENT_AMBULATORY_CARE_PROVIDER_SITE_OTHER): Payer: Self-pay | Admitting: *Deleted

## 2009-02-03 ENCOUNTER — Telehealth: Payer: Self-pay | Admitting: Internal Medicine

## 2009-02-16 ENCOUNTER — Encounter: Payer: Self-pay | Admitting: *Deleted

## 2009-04-01 ENCOUNTER — Ambulatory Visit: Payer: Self-pay | Admitting: Cardiology

## 2009-04-08 ENCOUNTER — Telehealth: Payer: Self-pay | Admitting: *Deleted

## 2009-04-22 ENCOUNTER — Ambulatory Visit: Payer: Self-pay | Admitting: Internal Medicine

## 2009-04-22 LAB — CONVERTED CEMR LAB
BUN: 13 mg/dL (ref 6–23)
CO2: 29 meq/L (ref 19–32)
Calcium: 9.1 mg/dL (ref 8.4–10.5)
Chloride: 109 meq/L (ref 96–112)
Creatinine, Ser: 1.1 mg/dL (ref 0.4–1.2)
GFR calc non Af Amer: 65.86 mL/min (ref >60)
Glucose, Bld: 96 mg/dL (ref 70–99)
INR: 2.4
Potassium: 4.1 meq/L (ref 3.5–5.1)
Prothrombin Time: 19 s
Sodium: 144 meq/L (ref 135–145)

## 2009-04-23 ENCOUNTER — Encounter: Payer: Self-pay | Admitting: Internal Medicine

## 2009-04-28 ENCOUNTER — Encounter: Payer: Self-pay | Admitting: *Deleted

## 2009-05-05 LAB — CONVERTED CEMR LAB: Vit D, 25-Hydroxy: 9 ng/mL — ABNORMAL LOW (ref 30–89)

## 2009-05-10 ENCOUNTER — Telehealth: Payer: Self-pay | Admitting: *Deleted

## 2009-07-14 ENCOUNTER — Telehealth: Payer: Self-pay | Admitting: *Deleted

## 2009-07-20 ENCOUNTER — Encounter: Payer: Self-pay | Admitting: Internal Medicine

## 2009-07-30 ENCOUNTER — Ambulatory Visit: Payer: Self-pay | Admitting: Internal Medicine

## 2009-07-30 LAB — CONVERTED CEMR LAB
INR: 2.5
Prothrombin Time: 19.3 s
Vit D, 25-Hydroxy: 36 ng/mL (ref 30–89)

## 2009-08-06 ENCOUNTER — Telehealth: Payer: Self-pay | Admitting: *Deleted

## 2009-08-06 ENCOUNTER — Ambulatory Visit: Payer: Self-pay | Admitting: Internal Medicine

## 2009-08-06 DIAGNOSIS — E559 Vitamin D deficiency, unspecified: Secondary | ICD-10-CM | POA: Insufficient documentation

## 2009-08-09 ENCOUNTER — Telehealth: Payer: Self-pay | Admitting: *Deleted

## 2009-08-25 ENCOUNTER — Ambulatory Visit: Payer: Self-pay | Admitting: Internal Medicine

## 2010-01-03 ENCOUNTER — Encounter: Payer: Self-pay | Admitting: Internal Medicine

## 2010-01-03 ENCOUNTER — Encounter (INDEPENDENT_AMBULATORY_CARE_PROVIDER_SITE_OTHER): Payer: Self-pay | Admitting: *Deleted

## 2010-01-03 LAB — CONVERTED CEMR LAB
ALT: 16 units/L
CO2: 25 meq/L
Chloride: 106 meq/L
Creatinine, Ser: 1.2 mg/dL
HCT: 40.2 %
Hemoglobin: 13.4 g/dL
INR: 2.21
Prothrombin Time: 26.4 s
Triglyceride fasting, serum: 92 mg/dL
Vit D, 25-Hydroxy: 33.8 ng/mL

## 2010-02-08 ENCOUNTER — Encounter: Payer: Self-pay | Admitting: *Deleted

## 2010-02-09 ENCOUNTER — Encounter: Payer: Self-pay | Admitting: *Deleted

## 2010-02-09 ENCOUNTER — Telehealth: Payer: Self-pay | Admitting: *Deleted

## 2010-03-07 ENCOUNTER — Ambulatory Visit: Payer: Self-pay | Admitting: Internal Medicine

## 2010-03-28 ENCOUNTER — Encounter: Payer: Self-pay | Admitting: *Deleted

## 2010-03-28 ENCOUNTER — Encounter: Payer: Self-pay | Admitting: Internal Medicine

## 2010-03-29 ENCOUNTER — Telehealth: Payer: Self-pay | Admitting: *Deleted

## 2010-03-29 ENCOUNTER — Encounter: Payer: Self-pay | Admitting: *Deleted

## 2010-04-25 ENCOUNTER — Encounter (INDEPENDENT_AMBULATORY_CARE_PROVIDER_SITE_OTHER): Payer: Self-pay | Admitting: *Deleted

## 2010-04-28 ENCOUNTER — Encounter: Payer: Self-pay | Admitting: Internal Medicine

## 2010-04-29 ENCOUNTER — Encounter: Payer: Self-pay | Admitting: Internal Medicine

## 2010-05-02 ENCOUNTER — Telehealth: Payer: Self-pay | Admitting: Internal Medicine

## 2010-05-02 ENCOUNTER — Encounter: Payer: Self-pay | Admitting: Internal Medicine

## 2010-06-02 ENCOUNTER — Encounter: Admission: RE | Admit: 2010-06-02 | Discharge: 2010-06-02 | Payer: Self-pay | Admitting: Internal Medicine

## 2010-06-02 IMAGING — MG MM DIGITAL SCREENING
4 series · 4 of 4 positions shown · non-contrast
Comparison: none

DG SCREEN MAMMOGRAM BILATERAL
Bilateral CC and MLO view(s) were taken.

DIGITAL SCREENING MAMMOGRAM WITH CAD:
The breast tissue is almost entirely fatty.  No masses or malignant type calcifications are 
identified.  Compared with prior studies.
Images were processed with CAD.

[R CC]
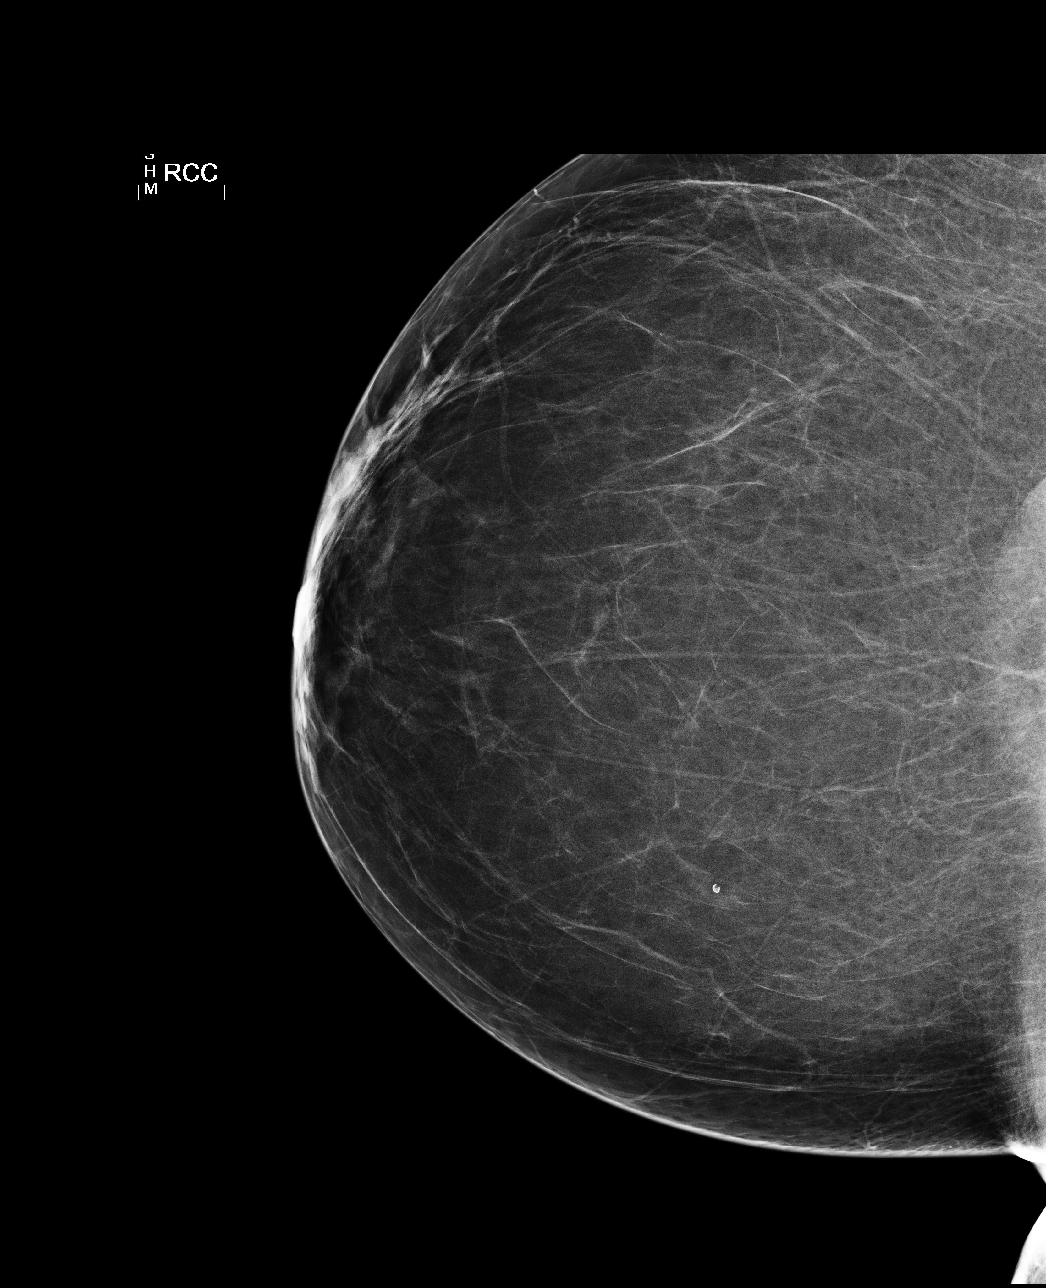

[L CC]
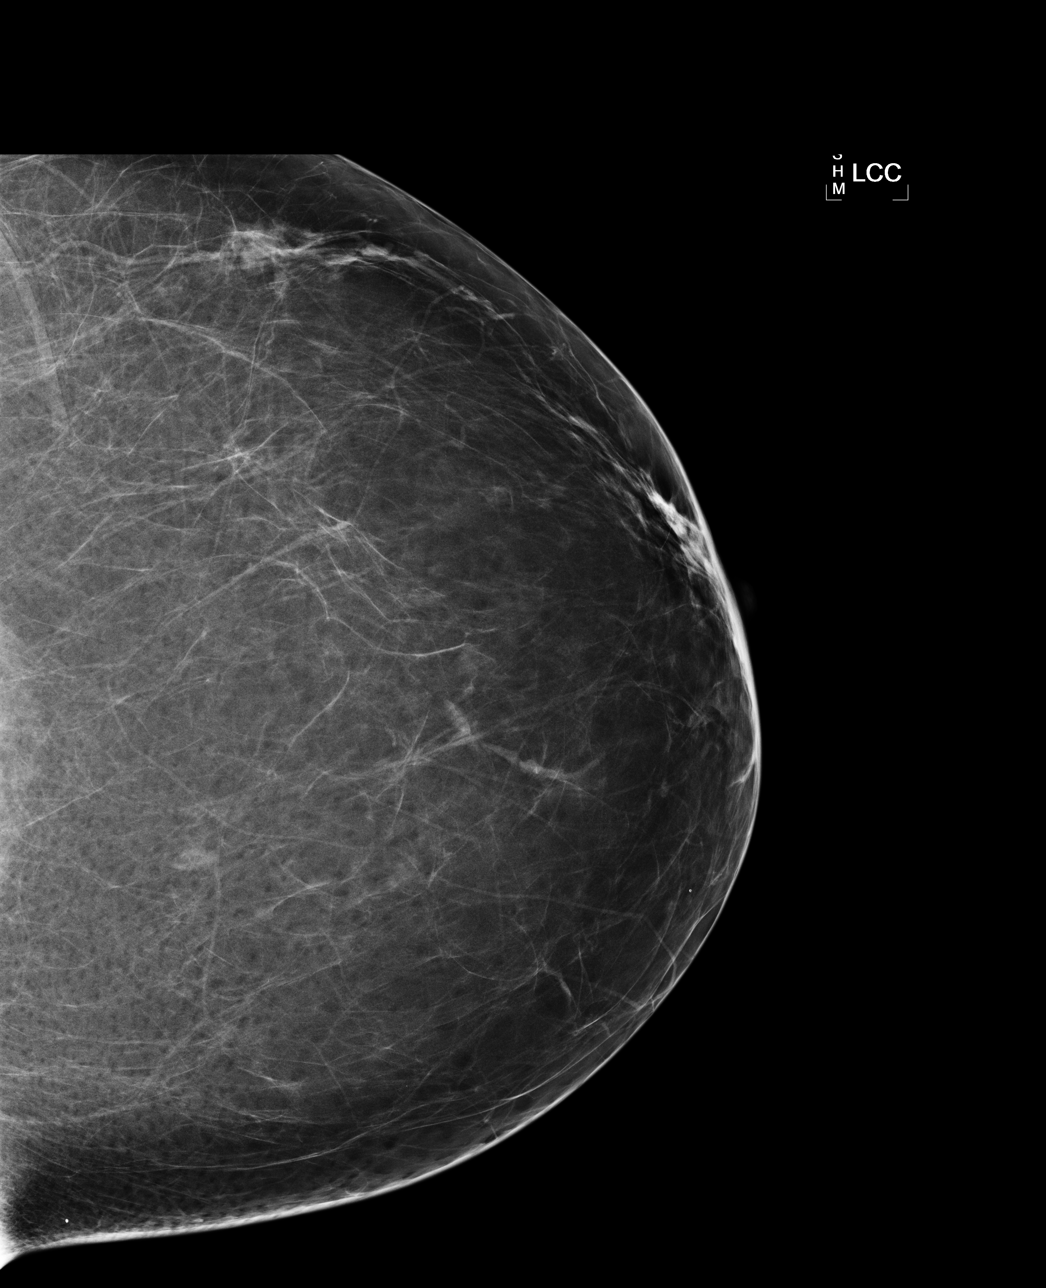

[L MLO]
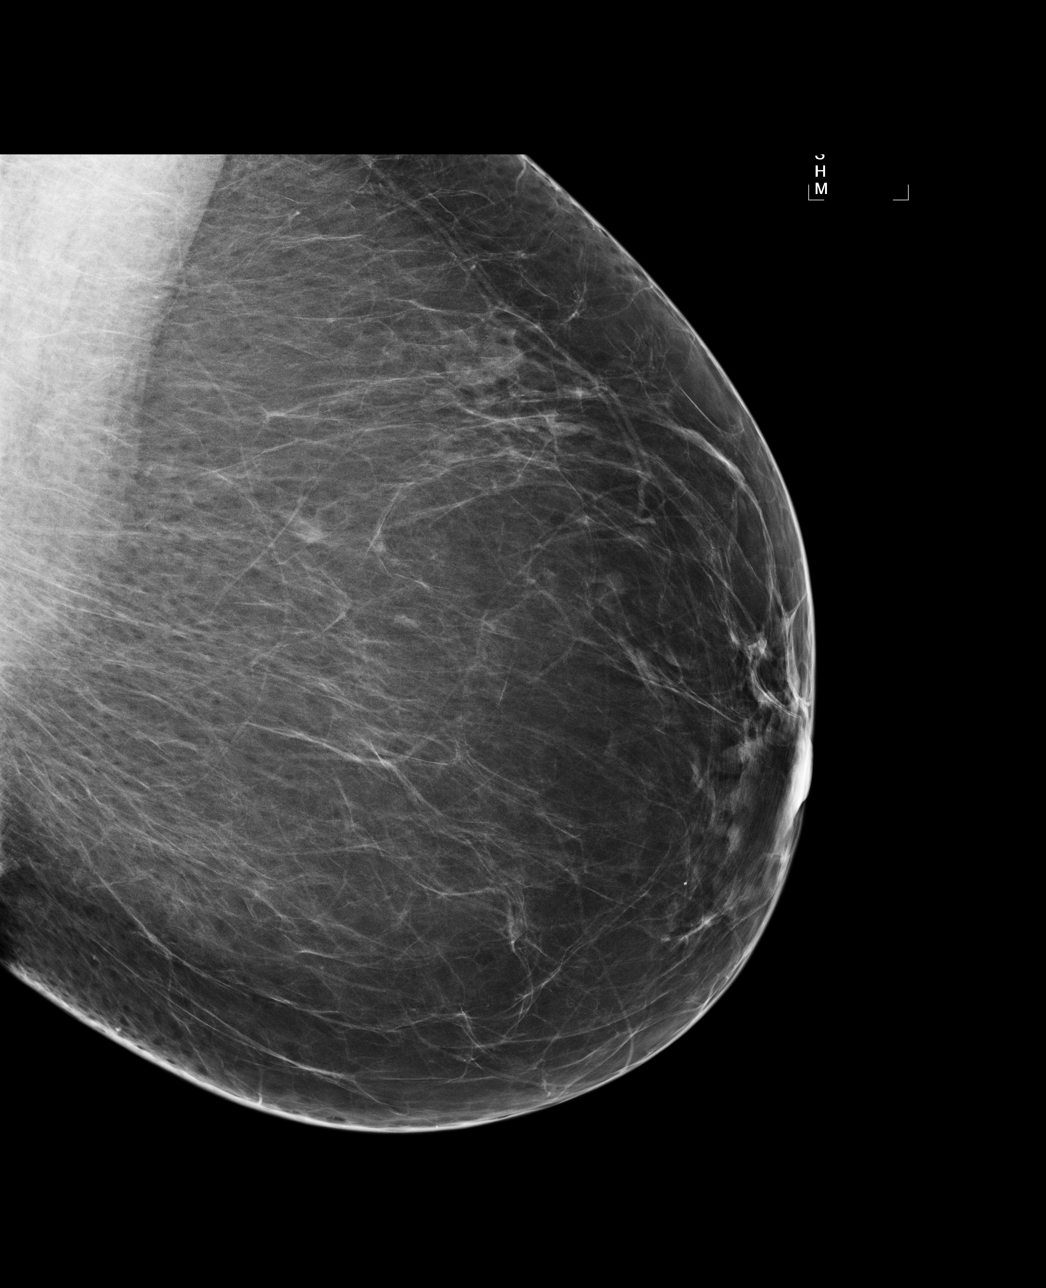

[R MLO]
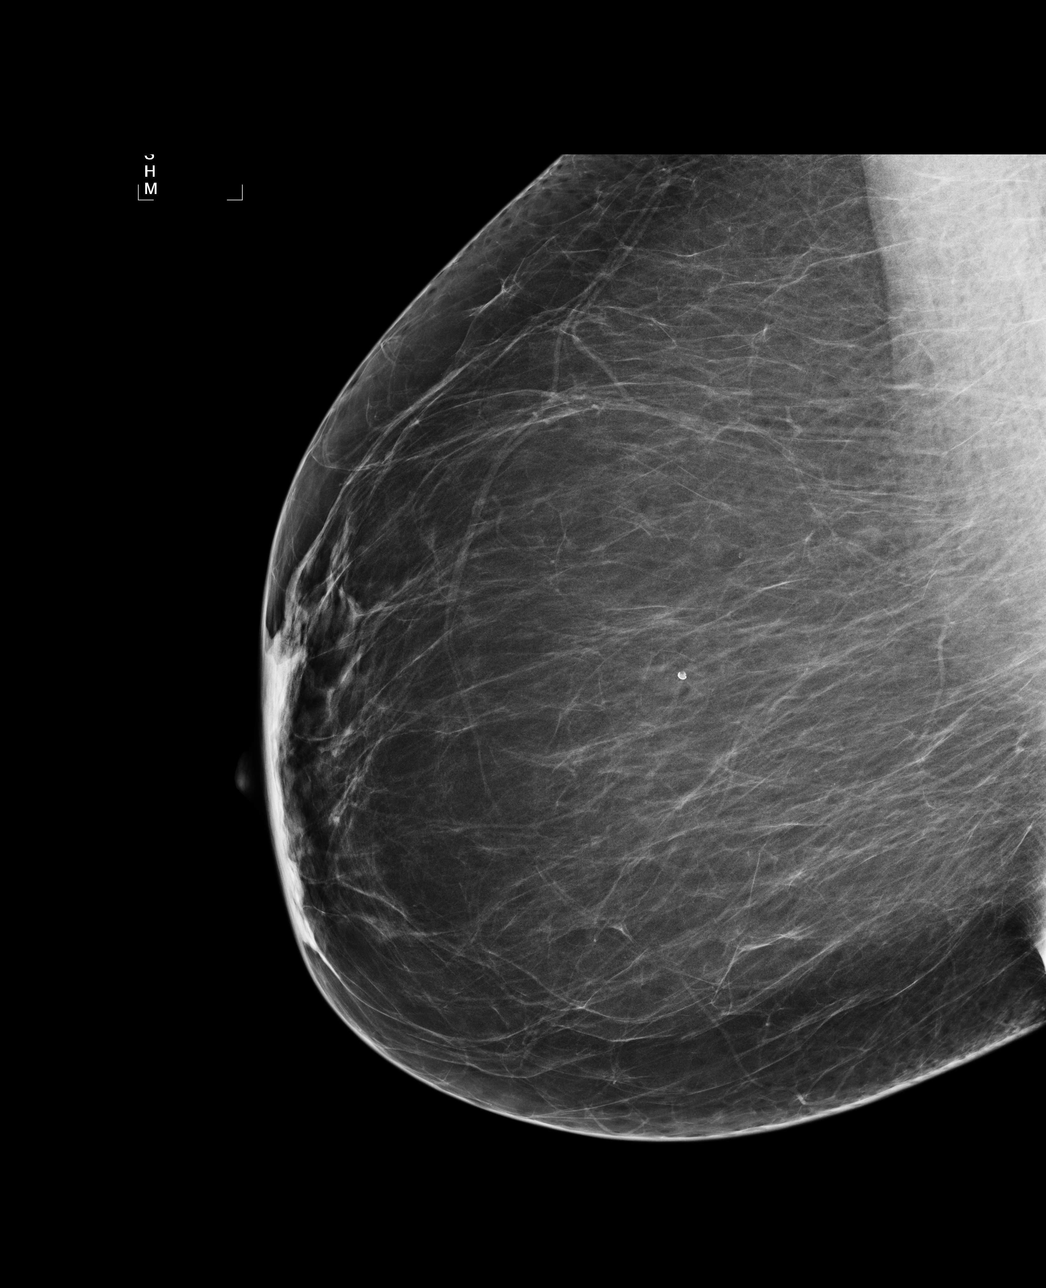

[4 of 4 positions shown; findings below may reference images not displayed]

IMPRESSION: No specific mammographic evidence of malignancy.  Next screening mammogram is recommended in one 
year.

A result letter of this screening mammogram will be mailed directly to the patient.

ASSESSMENT: Negative - BI-RADS 1

Screening mammogram in 1 year.
,

## 2010-06-30 ENCOUNTER — Encounter: Payer: Self-pay | Admitting: Internal Medicine

## 2010-06-30 ENCOUNTER — Ambulatory Visit: Payer: Self-pay | Admitting: Cardiology

## 2010-06-30 LAB — CONVERTED CEMR LAB: POC INR: 3.6

## 2010-07-05 ENCOUNTER — Ambulatory Visit (HOSPITAL_COMMUNITY)
Admission: RE | Admit: 2010-07-05 | Discharge: 2010-07-05 | Payer: Self-pay | Source: Home / Self Care | Admitting: Cardiology

## 2010-07-05 ENCOUNTER — Encounter: Payer: Self-pay | Admitting: Cardiology

## 2010-07-05 ENCOUNTER — Encounter (INDEPENDENT_AMBULATORY_CARE_PROVIDER_SITE_OTHER): Payer: Self-pay | Admitting: *Deleted

## 2010-07-05 ENCOUNTER — Ambulatory Visit: Payer: Self-pay

## 2010-07-05 ENCOUNTER — Ambulatory Visit: Payer: Self-pay | Admitting: Cardiovascular Disease

## 2010-07-06 ENCOUNTER — Telehealth: Payer: Self-pay | Admitting: *Deleted

## 2010-07-08 ENCOUNTER — Encounter: Payer: Self-pay | Admitting: *Deleted

## 2010-07-13 ENCOUNTER — Ambulatory Visit: Payer: Self-pay | Admitting: Internal Medicine

## 2010-07-13 LAB — CONVERTED CEMR LAB: INR: 3.3

## 2010-08-12 ENCOUNTER — Telehealth: Payer: Self-pay | Admitting: *Deleted

## 2010-08-18 ENCOUNTER — Encounter: Payer: Self-pay | Admitting: *Deleted

## 2010-09-08 ENCOUNTER — Ambulatory Visit: Payer: Self-pay | Admitting: Internal Medicine

## 2010-09-08 LAB — CONVERTED CEMR LAB: INR: 3.2

## 2010-10-14 ENCOUNTER — Ambulatory Visit
Admission: RE | Admit: 2010-10-14 | Discharge: 2010-10-14 | Payer: Self-pay | Source: Home / Self Care | Attending: Internal Medicine | Admitting: Internal Medicine

## 2010-10-14 LAB — CONVERTED CEMR LAB: INR: 2.4

## 2010-11-01 NOTE — Progress Notes (Signed)
Summary: pt needs a lab and rov appt.  Phone Note Outgoing Call Call back at [phone]   Call placed by: Sherron Monday, CMA Deborra Medina),  Feb 09, 2010 8:58 AM Call placed to: Patient Summary of Call: I called pt's work and left a message for her to call the office. Pt needs to reschedule her labs and a follow up appt that she missed.   Initial call taken by: Sherron Monday, [place] Deborra Medina),  Feb 09, 2010 8:59 AM  Follow-up for Phone Call        Called 02/16/10 Delfinia not in office until 3 pm today Follow-up by: Clearnce Sorrel CMA,  Feb 16, 2010 9:13 AM  Additional Follow-up for Phone Call Additional follow up Details #1::        Pt called back and appt made for 03/07/10.  Additional Follow-up by: Sherron Monday, CMA (AAMA),  Feb 16, 2010 2:48 PM

## 2010-11-01 NOTE — Letter (Signed)
Summary: Generic Letter  [facility]   [place], [place] [address]   Phone: [phone]  Fax: [phone]    02/09/2010  [name] Foster [place], [place]  [address]  Dear [name] Foster,  We seen that you cancelled your last lab and follow up with Dr. Regis Bill. You are on medications that require lab monitoring for your health and safety. Please contact our office as soon as possible to reschedule these appointments. If you are unable to do this, please call us to let us know how you are doing. We have tried to contact you by phone but the number we have is a wrong number. We are going to refill your medication one time only until we hear from you. Please call and speak to [facility] at [phone].         Sincerely,   Paul Half, CMA (AAMA)

## 2010-11-01 NOTE — Letter (Signed)
Summary: Note from patient re: PT-INR done at Cardiology Office  Note from patient re: PT-INR done at Cardiology Office   Imported By: Laural Benes 07/14/2010 12:57:57  _____________________________________________________________________  External Attachment:    Type:   Image     Comment:   External Document

## 2010-11-01 NOTE — Miscellaneous (Signed)
Summary: PT/INR and Physician's Orders/[facility] [place]  PT/INR and Physician's Orders/[facility] [place]   Imported By: Laural Benes 05/05/2010 10:07:54  _____________________________________________________________________  External Attachment:    Type:   Image     Comment:   External Document

## 2010-11-01 NOTE — Progress Notes (Signed)
Summary: protime  Phone Note Outgoing Call Call back at [facility] Phone [phone]   Call placed by: Sherron Monday, CMA Deborra Medina),  March 29, 2010 11:48 AM Call placed to: Patient Summary of Call: Pt's protime is okay and needs to be rechecked in 1 month.  Follow-up for Phone Call        Pt aware of results. Follow-up by: Sherron Monday, [place] (AAMA),  March 29, 2010 11:50 AM

## 2010-11-01 NOTE — Miscellaneous (Signed)
Summary: Appointment Canceled  Appointment status changed to canceled by [employer] on 07/05/2010 10:14 AM.  Cancellation Comments --------------------- ECHO DX 428.0/MEDCOST/SL  Appointment Information ----------------------- Appt Type:  CARDIOLOGY ANCILLARY VISIT      Date:  Wednesday, July 06, 2010      Time:  9:30 AM for 60 min   Urgency:  Routine   Made By:  Golden Pop Scheduler  To Visit:  [id number]    Reason:  ECHO DX 428.0/MEDCOST/SL  Appt Comments ------------- -- 07/05/10 10:14: (CEMR) CANCELED -- ECHO DX 428.0/MEDCOST/SL -- 07/05/10 10:12: (CEMR) BOOKED -- Routine CARDIOLOGY ANCILLARY VISIT at 07/06/2010 9:30 AM for 60 min ECHO DX 428.0/MEDCOST/SL

## 2010-11-01 NOTE — Miscellaneous (Signed)
Summary: Appointment Canceled  Appointment status changed to canceled by [employer] on 07/05/2010 9:27 AM.  Cancellation Comments --------------------- ECHO DX 428.0/MEDCOST/SL  Appointment Information ----------------------- Appt Type:  CARDIOLOGY ANCILLARY VISIT      Date:  Wednesday, July 06, 2010      Time:  9:30 AM for 60 min   Urgency:  Routine   Made By:  Golden Pop Scheduler  To Visit:  [id number]    Reason:  ECHO DX 428.0/MEDCOST/SL  Appt Comments ------------- -- 07/05/10 9:27: (CEMR) CANCELED -- ECHO DX 428.0/MEDCOST/SL -- 07/05/10 8:56: (CEMR) BOOKED -- Routine CARDIOLOGY ANCILLARY VISIT at 07/06/2010 9:30 AM for 60 min ECHO DX 428.0/MEDCOST/SL Unknown resources scheduled: [id number]

## 2010-11-01 NOTE — Progress Notes (Signed)
Summary: pt results  Phone Note Outgoing Call   Call placed by: Sherron Monday, CMA Deborra Medina),  May 02, 2010 5:21 PM Call placed to: Patient Summary of Call: Protime 2.4. Per Dr. Regis Bill- Stay on same dose and check in 1 month.  Pt aware  Initial call taken by: Sherron Monday, CMA (AAMA),  May 02, 2010 5:22 PM      ANTICOAGULATION RECORD PREVIOUS REGIMEN & LAB RESULTS Anticoagulation Diagnosis:  v58.83v58.61,453.41 on  07/01/2007 Previous INR Goal Range:  2.0-3.0 on  03/24/2008 Previous INR:  2.25 on  03/28/2010 Previous Coumadin Dose(mg):  5mg  on m,w,f 7.5mg  other days on  11/20/2008 Previous Regimen:  same on  04/22/2009 Previous Coagulation Comments:  BY DR.TODD on  05/19/2008  NEW REGIMEN & LAB RESULTS Regimen: same  (no change)

## 2010-11-01 NOTE — Miscellaneous (Signed)
Summary: Appointment Canceled  Appointment status changed to canceled by [employer] on 07/05/2010 10:12 AM.  Cancellation Comments --------------------- ECHO DX 428.0/MEDCOST/SL  Appointment Information ----------------------- Appt Type:  CARDIOLOGY ANCILLARY VISIT      Date:  Wednesday, July 06, 2010      Time:  9:30 AM for 60 min   Urgency:  Routine   Made By:  Golden Pop Scheduler  To Visit:  [id number]    Reason:  ECHO DX 428.0/MEDCOST/SL  Appt Comments ------------- -- 07/05/10 10:12: (CEMR) CANCELED -- ECHO DX 428.0/MEDCOST/SL -- 07/05/10 9:27: (CEMR) BOOKED -- Routine CARDIOLOGY ANCILLARY VISIT at 07/06/2010 9:30 AM for 60 min ECHO DX 428.0/MEDCOST/SL Unknown resources scheduled: [id number]

## 2010-11-01 NOTE — Assessment & Plan Note (Signed)
Summary: follow up on meds/ssc   Vital Signs:  Patient profile:   60 year old female Menstrual status:  hysterectomy Height:      66.25 inches Weight:      196 pounds BMI:     31.51 Pulse rate:   66 / minute BP sitting:   110 / 74  (left arm) Cuff size:   regular  Vitals Entered By: Sherron Monday, CMA (AAMA) (March 07, 2010 2:27 PM) CC: Follow-up visit on labs and meds, Hypertension Management   History of Present Illness: [name] Foster comes in today   for multiple medical problems . She brings [place] a copy of labs done in January 03 2010 that include nl cbc PT 2.21, vit d 33.8 , tsh 1.9 and BMp nl but cr 1.2 and potassium 3.6 , lfts nl, tc 1435 hdl 55 ratio 2.6    CV Breathing ok   except when hot.  Sees Cardiology Dr Percival Spanish yearly . stable EF lv function BP:   120  to  116  over 60- 80  No  chroinc cough or wheeze .  NO change in exercise tolerance .  Coumadin   running  2 or more.  Is being  done at work .    Financially more acceptable  . getting this done about once a month.  PN MWF 5 mg and 7.5 other days Her Meds lipids were changed to caduet   Hypertension History:      She complains of chest pain, peripheral edema, and syncope, but denies headache, palpitations, dyspnea with exertion, orthopnea, PND, visual symptoms, neurologic problems, and side effects from treatment.  She notes no problems with any antihypertensive medication side effects.  Dizziness.        Positive major cardiovascular risk factors include female age 66 years old or older, hyperlipidemia, hypertension, and current tobacco user.  Negative major cardiovascular risk factors include no history of diabetes.        Positive history for target organ damage include cardiac end organ damage (either CHF or LVH).  Further assessment for target organ damage reveals no history of stroke/TIA or peripheral vascular disease.     Preventive Screening-Counseling & Management  Alcohol-Tobacco     Alcohol  drinks/day: <1     Alcohol type: mixed drinks     Smoking Status: current     Packs/Day: <0.25     Year Started: age 6  Caffeine-Diet-Exercise     Caffeine use/day: 1     Does Patient Exercise: yes     Type of exercise: walking, bike  Current Medications (verified): 1)  Metoprolol Tartrate 25 Mg  Tabs (Metoprolol Tartrate) .Marland Kitchen.. 1 By Mouth Two Times A Day 2)  Altace 10 Mg  Caps (Ramipril) .Marland Kitchen.. 1 By Mouth Two Times A Day 3)  Lasix 20 Mg Tabs (Furosemide) .Marland Kitchen.. 1 Once Daily 4)  Coumadin 5 Mg Tabs (Warfarin Sodium) .... 5 Mg Mon, Wed, Fri and 7.5 Mg The Rest of The Week. 5)  Caduet 10-20 Mg Tabs (Amlodipine-Atorvastatin) .Marland Kitchen.. 1 By Mouth Once Daily 6)  Fexofenadine Hcl 60 Mg  Tabs (Fexofenadine Hcl) .... As Needed 7)  Klor-Con M10 10 Meq Cr-Tabs (Potassium Chloride Crys Cr) .... 2  By Mouth Once Daily or As Directed 8)  Vitamin D (Ergocalciferol) 50000 Unit Caps (Ergocalciferol) .... One Every Other Week 9)  Fluticasone Propionate 50 Mcg/act Susp (Fluticasone Propionate) .... 2 Sprays Each Nostril Each Day  Allergies (verified): 1)  ! Penicillin 2)  ! *  Tetanus 3)  ! Asa  Past History:  Past medical, surgical, family and social histories (including risk factors) reviewed, and no changes noted (except as noted below).  Past Medical History: Current Problems:  CARDIOMYOPATHY, PRIMARY NEC (ICD-425.4) CONGESTIVE HEART FAILURE CATH 04 EF 35%  MOD AI,PRESTON (ICD-428.0) CHF (ICD-428.0) HYPERTENSION (ICD-401.9) EDEMA (ICD-782.3) AORTIC INSUFFICIENCY MODERATE (ICD-424.1) HYPERLIPIDEMIA (ICD-272.2) DISEASE, HYPERTENSIVE HEART NOS W/HF (ICD-402.91) ANKLE PAIN, LEFT (ICD-719.47) COLONIC POLYPS, HX OF COLONOSC 05 (ICD-V12.72) HEADACHE (ICD-784.0) DVT, HX OF (ICD-V12.51)  right leg by report  ASTHMA (ICD-493.90) ALLERGIC RHINITIS (ICD-477.9) SYMPTOM, SYNCOPE AND COLLAPSE (ICD-780.2) COUMADIN THERAPY (ICD-V58.61) RHINITIS (ICD-477.9) HYPERKALEMIA (ICD-276.7) KNEE PAIN, RIGHT  (ICD-719.46) HYPOKALEMIA (ICD-276.8) G4 P2 Congestive heart failure last echo, normal EF  2009  Consults Dr. Stanford Breed- Cardio  Dr. Kendall Flack  Past Surgical History: Reviewed history from 01/26/2009 and no changes required. Hysterectomy Rt Knee Arthoscopic D & C (several) Cardiac Cath  colonoscopy 2005 with biopsy.  Past History:  Care Management: Cardiology: [facility]ics: [facility]  Family History: Reviewed history from 10/23/2008 and no changes required. Family History of Alcoholism/Addiction Family History of Colon CA 1st degree relative <60 Family History Depression Family History High cholesterol Family History Hypertension Family History Kidney disease Family History Uterine cancer Family History Breast cancer 1st degree relative <50-cousin Family History of Stroke F 1st degree relative <60 Grandmother        Social History: Reviewed history from 10/23/2008 and no changes required. 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    Alcohol use-yes Drug use-no Regular exercise-no  5 hours sleep lives with a son age 87 no pets.       Review of Systems  The patient denies anorexia, fever, weight loss, weight gain, vision loss, chest pain, syncope, prolonged cough, abdominal pain, suspicious skin lesions, difficulty walking, unusual weight change, abnormal bleeding, enlarged lymph nodes, and angioedema.         denies acute sob or acute edema    usually related to ling work day. NO current  chang in exercise tolerance  Physical Exam  General:  alert, well-developed, and well-nourished.   Head:  normocephalic and atraumatic.   Neck:  No deformities, masses, or tenderness noted. Lungs:  Normal respiratory effort, chest expands symmetrically. Lungs are clear to auscultation, no crackles or wheezes.no dullness.   Heart:  normal rate, regular rhythm, no gallop, no rub, and no lifts.  sem usb. diastolic runoffno JVD.   Abdomen:  Bowel  sounds positive,abdomen soft and non-tender without masses, organomegaly or   noted. Pulses:  pulses intact without delay    not bounding Extremities:  trace left pedal edema and trace right pedal edema.   Neurologic:  non focal  Skin:  turgor normal and color normal.  no ecchymoses and no petechiae.   Cervical Nodes:  No lymphadenopathy noted Psych:  Oriented X3, good eye contact, not anxious appearing, and not depressed appearing.     Impression & Recommendations:  Problem # 1:  HYPERTENSION (ICD-401.9) maintain control  cr 1.2   to follow The following medications were removed from the medication list:    Norvasc 10 Mg Tabs (Amlodipine besylate) .Marland Kitchen... 1 by mouth once daily Her updated medication list for this problem includes:    Metoprolol Tartrate 25 Mg Tabs (Metoprolol tartrate) .Marland Kitchen... 1 by mouth two times a day    Altace 10 Mg Caps (Ramipril) .Marland Kitchen... 1 by mouth two times a day  Lasix 20 Mg Tabs (Furosemide) .Marland Kitchen... 1 once daily    Caduet 10-20 Mg Tabs (Amlodipine-atorvastatin) .Marland Kitchen... 1 by mouth once daily  BP today: 110/74 Prior BP: 110/82 (08/06/2009)  10 Yr Risk Heart Disease: 7 % Prior 10 Yr Risk Heart Disease: 11 % (04/22/2009)  Labs Reviewed: K+: 4.1 (04/22/2009) Creat: : 1.1 (04/22/2009)   Chol: 144 (10/16/2008)   HDL: 37.4 (10/16/2008)   LDL: 83 (10/16/2008)   TG: 118 (10/16/2008)  Problem # 2:  CARDIOMYOPATHY, PRIMARY NEC (ICD-425.4) stable without new symptom   Problem # 3:  AORTIC INSUFFICIENCY MODERATE (ICD-424.1) sees cards yearly Her updated medication list for this problem includes:    Metoprolol Tartrate 25 Mg Tabs (Metoprolol tartrate) .Marland Kitchen... 1 by mouth two times a day    Coumadin 5 Mg Tabs (Warfarin sodium) .Marland KitchenMarland KitchenMarland KitchenMarland Kitchen 5 mg mon, wed, fri and 7.5 mg the rest of the week.  Echocardiogram:    SUMMARY   -  Overall left ventricular systolic function was normal. Left         ventricular ejection fraction was estimated to be 55 %.   -  There was mild to moderate  aortic valvular regurgitation.   -  There was mild mitral valvular regurgitation.     ---------------------------------------------------------------    Prepared and Electronically Authenticated by    Dorris Carnes M.D.   Confirmed 23-Jul-2008 13:48:13  Additional Information  HL7 RESULT STATUS : F  External IF Update Timestamp : 2008-07-23:13:48:00.000000  (07/23/2008)  Problem # 4:  HYPERLIPIDEMIA (ICD-272.2)  Her updated medication list for this problem includes:    Caduet 10-20 Mg Tabs (Amlodipine-atorvastatin) .Marland Kitchen... 1 by mouth once daily  Labs Reviewed: SGOT: 18 (10/16/2008)   SGPT: 19 (10/16/2008)  Lipid Goals: Chol Goal: 200 (10/25/2007)   HDL Goal: 40 (10/25/2007)   LDL Goal: 130 (10/25/2007)   TG Goal: 150 (10/25/2007)  10 Yr Risk Heart Disease: 7 % Prior 10 Yr Risk Heart Disease: 11 % (04/22/2009)   HDL:37.4 (10/16/2008), 41.4 (10/18/2007)  LDL:83 (10/16/2008), 88 (10/18/2007)  Chol:144 (10/16/2008), 155 (10/18/2007)  Trig:118 (10/16/2008), 130 (10/18/2007)  Problem # 5:  COUMADIN THERAPY (ICD-V58.61) has been getting protimes at work  . counseled about getting [place] the information   when labs done so we can be monitoring this .  as opposed to just self monitoring.   Problem # 6:  DVT, HX OF (ICD-V12.51) reviewed record and hx of recurrent dvt right le  but no records are available about this.   will revisit this at future visit as length of  anticoagulation.  Her updated medication list for this problem includes:    Coumadin 5 Mg Tabs (Warfarin sodium) .Marland KitchenMarland KitchenMarland KitchenMarland Kitchen 5 mg mon, wed, fri and 7.5 mg the rest of the week.  Problem # 7:  VITAMIN D DEFICIENCY (ICD-268.9) level 33.8  Problem # 8:  tobacco  small amt but needs to stop.  Complete Medication List: 1)  Metoprolol Tartrate 25 Mg Tabs (Metoprolol tartrate) .Marland Kitchen.. 1 by mouth two times a day 2)  Altace 10 Mg Caps (Ramipril) .Marland Kitchen.. 1 by mouth two times a day 3)  Lasix 20 Mg Tabs (Furosemide) .Marland Kitchen.. 1 once daily 4)  Coumadin 5  Mg Tabs (Warfarin sodium) .... 5 mg mon, wed, fri and 7.5 mg the rest of the week. 5)  Caduet 10-20 Mg Tabs (Amlodipine-atorvastatin) .Marland Kitchen.. 1 by mouth once daily 6)  Fexofenadine Hcl 60 Mg Tabs (Fexofenadine hcl) .... As needed 7)  Klor-con M10 10 Meq Cr-tabs (Potassium chloride crys cr) .Marland KitchenMarland KitchenMarland Kitchen  2  by mouth once daily or as directed 8)  Vitamin D (ergocalciferol) 50000 Unit Caps (Ergocalciferol) .... One every other week 9)  Fluticasone Propionate 50 Mcg/act Susp (Fluticasone propionate) .... 2 sprays each nostril each day  Other Orders: Protime [id number]) Fingerstick [id number])  Hypertension Assessment/Plan:      The patient's hypertensive risk group is category C: Target organ damage and/or diabetes.  Her calculated 10 year risk of coronary heart disease is 7 %.  Today's blood pressure is 110/74.  Her blood pressure goal is < 140/90.  Patient Instructions: 1)  get  results      of your protimes when done  to [place] fax or phone call etc.  . 2)  Rec  change vitamin d to   1000iu to 2000 international units per day ( this is OTC    call if you need Rx for this. )  3)  Stop tobacco again.    4)  Protime is 3.1    repeat in 3-4 weeks   call us with result 5)  otherwise  rov in 6 months or as needed. Prescriptions: CADUET 10-20 MG TABS (AMLODIPINE-ATORVASTATIN) 1 by mouth once daily  #30 x 6   Entered and Authorized by:   Burnis Medin MD   Signed by:   Burnis Medin MD on 03/07/2010   Method used:   Electronically to        [id number] (retail)       [address]       [place], [place]  [address]       Ph: [phone] or [phone]       Fax: [phone]   RxID:   [id number] ALTACE 10 MG  CAPS (RAMIPRIL) 1 by mouth two times a day  #60 Capsule x 6   Entered and Authorized by:   Burnis Medin MD   Signed by:   Burnis Medin MD on 03/07/2010   Method used:   Electronically to        [id number] (retail)       [address]       [place], [place]  [address]       Ph:  [phone] or [phone]       Fax: [phone]   [id number] METOPROLOL TARTRATE 25 MG  TABS (METOPROLOL TARTRATE) 1 by mouth two times a day  #60 Tablet x 6   Entered and Authorized by:   Burnis Medin MD   Signed by:   Burnis Medin MD on 03/07/2010   Method used:   Electronically to        [id number] (retail)       [address]       [place], [place]  [address]       Ph: [phone] or [phone]       Fax: [phone]   RxID:   [id number]   Laboratory Results   Blood Tests   Date/Time Recieved: March 07, 2010 3:15 PM  Date/Time Reported: March 07, 2010 3:15 PM    INR: 3.1   (Normal Range: 0.88-1.12   Therap INR: 2.0-3.5) Comments: Doy Hutching, CMA  March 07, 2010 3:15 PM       ANTICOAGULATION RECORD PREVIOUS REGIMEN & LAB RESULTS Anticoagulation Diagnosis:  v58.83v58.61,453.41 on  07/01/2007 Previous INR Goal Range:  2.0-3.0 on  03/24/2008 Previous INR:  2.8 on  08/25/2009 Previous Coumadin Dose(mg):  5mg  on m,w,f 7.5mg  other days on  11/20/2008 Previous Regimen:  same on  04/22/2009 Previous Coagulation Comments:  BY DR.TODD on  05/19/2008  NEW REGIMEN & LAB RESULTS Current INR: 3.1 Regimen: same  (no change)  MEDICATIONS METOPROLOL TARTRATE 25 MG  TABS (METOPROLOL TARTRATE) 1 by mouth two times a day ALTACE 10 MG  CAPS (RAMIPRIL) 1 by mouth two times a day LASIX 20 MG TABS (FUROSEMIDE) 1 once daily COUMADIN 5 MG TABS (WARFARIN SODIUM) 5 mg mon, wed, fri and 7.5 mg the rest of the week. CADUET 10-20 MG TABS (AMLODIPINE-ATORVASTATIN) 1 by mouth once daily FEXOFENADINE HCL 60 MG  TABS (FEXOFENADINE HCL) as needed KLOR-CON M10 10 MEQ CR-TABS (POTASSIUM CHLORIDE CRYS CR) 2  by mouth once daily or as directed VITAMIN D (ERGOCALCIFEROL) 50000 UNIT CAPS (ERGOCALCIFEROL) One every other week FLUTICASONE PROPIONATE 50 MCG/ACT SUSP (FLUTICASONE PROPIONATE) 2 sprays each nostril each day   Anticoagulation Visit Questionnaire       Coumadin dose missed/changed:  No      Abnormal Bleeding Symptoms:  No   Any diet changes including alcohol intake, vegetables or greens since the last visit:  No Any illnesses or hospitalizations since the last visit:  No Any signs of clotting since the last visit (including chest discomfort, dizziness, shortness of breath, arm tingling, slurred speech, swelling or redness in leg):  No

## 2010-11-01 NOTE — Progress Notes (Signed)
  Phone Note Outgoing Call   Call placed by: Joyce Gross Call placed to: Patient Details for Reason: INR Summary of Call: Tried both phone numbers to remind patient she needs to have her INR checked. [facility] said they had no one there by that name and the other number was disconected.  Follow-up for Phone Call        Is that her work or  home number .   she works in a health care  facility   .  ?  send a letter   Follow-up by: Burnis Medin MD,  August 15, 2010 5:30 PM  Additional Follow-up for Phone Call Additional follow up Details #1::        Mailed letter to call office and schedule a follow up protime. Additional Follow-up by: Sherron Monday, CMA Deborra Medina),  August 18, 2010 2:34 PM

## 2010-11-01 NOTE — Progress Notes (Signed)
Summary: Needs protime appt  Phone Note Outgoing Call Call back at [facility] Phone [phone]   Call placed by: Sherron Monday, [place] Deborra Medina),  July 06, 2010 5:27 PM Call placed to: Patient Summary of Call: Pt needs to have a protime appt within 10-14 days from 9/29. Tried to call pt but was unable to leave a message. Initial call taken by: Sherron Monday, [place] (AAMA),  July 06, 2010 5:29 PM  Follow-up for Phone Call        Tried to call pt about scheduling a protime. But was unable to leave a message. Mailed letter to pt about this. Follow-up by: Sherron Monday, CMA Deborra Medina),  July 08, 2010 4:34 PM  Additional Follow-up for Phone Call Additional follow up Details #1::        Pt aware and appt made. Additional Follow-up by: Sherron Monday, CMA (AAMA),  July 12, 2010 2:05 PM

## 2010-11-01 NOTE — Medication Information (Signed)
Summary: COUMADIN CHECK TODAY PER [facility]  Anticoagulant Therapy  Managed by: Inactive Referring MD: Kirk Ruths PCP: Shanon Ace Supervising MD: Aundra Dubin MD,D[place] Indication 1: DVT Lab Used: [facility] [place] Site: [place] INR POC 3.6 INR RANGE 2.0-3.0  Dietary changes: no    Health status changes: no    Bleeding/hemorrhagic complications: no    Recent/future hospitalizations: no    Any changes in medication regimen? no    Recent/future dental: no  Any missed doses?: no       Is patient compliant with meds? yes      Comments: normally gets INR follwed @ [place] office. Dr. Stanford Breed wanted her INR checked today here. Will continue checking @ [place] office after this check.  Allergies: 1)  ! Penicillin 2)  ! * Tetanus 3)  ! Asa  Anticoagulation Management History:      The patient is taking warfarin and comes in today for a routine follow up visit.  Negative risk factors for bleeding include an age less than 85 years old and no history of CVA/TIA.  The bleeding index is 'low risk'.  Positive CHADS2 values include History of CHF and History of HTN.  Negative CHADS2 values include Age > 23 years old, History of Diabetes, and Prior Stroke/CVA/TIA.  Her last INR was 2.4.  Anticoagulation responsible Berman Grainger: Aundra Dubin MD,Dalton.  INR POC: 3.6.  Cuvette Lot#: [id number].    Anticoagulation Management Assessment/Plan:      The patient's current anticoagulation dose is Coumadin 5 mg tabs: 5 mg mon, wed, fri and 7.5 mg the rest of the week..  The next INR is due 1 month.  Results were reviewed/authorized by Inactive.         Current Anticoagulation Instructions: INR 3.6 Skip your dose today. Then resume normal schedule of 1 tablet on monday, wednesday, friday. And 1.5 tablets all other days. Have your INR rechecked @ the [place] office within 10-14 days to make sure it goes down.

## 2010-11-01 NOTE — Letter (Signed)
Summary: Generic Letter  [facility] [place]   [place], [place] [address]   Phone: [phone]  Fax: [phone]    07/08/2010  [facility] [address] [place], [place]  [address]  Dear Ms. HASKINS,  We received a letter on [facility] Coumadin [facility]. You need to have another protime in 10-14 days from 06/30/10. We have tried to call you but didn't get an answer. Please give [place] a call back at [phone] to schedule this appt.         Sincerely,   Paul Half, CMA (AAMA)

## 2010-11-01 NOTE — Letter (Signed)
Summary: Outpatient Coinsurance Notice  Outpatient Coinsurance Notice   Imported By: Marilynne Drivers 07/13/2010 13:30:58  _____________________________________________________________________  External Attachment:    Type:   Image     Comment:   External Document

## 2010-11-01 NOTE — Assessment & Plan Note (Signed)
Summary: 1 year fu up past due  Medications Added VITAMIN D 1000 UNIT  TABS (CHOLECALCIFEROL) once daily      Allergies Added:   Visit Type:  Follow-up Primary Provider:  Burnis Medin MD  CC:  nothing new.  History of Present Illness: [name] is a pleasant  female who has a history of nonischemic cardiomyopathy improved by most recent echocardiogram.  She also has aortic insufficiency.  Her most recent echo was performed in October 2009.  At that time, she was found to have an ejection fraction of 55%.  There was mild to moderate aortic insufficiency and mild mitral regurgitation.  She also has had a CT of her chest that showed no thoracic aneurysm.  She has been treated medically.  Note, when I saw her previously, she did have a Holter monitor secondary to "dizzy spells" that showed signs of PACs and PVCs. Also, note that her most recent Myoview was performed on August 07, 2007.  Her ejection fraction was 46% and there was soft tissue attenuation versus prior anterior infarct, but no ischemia.  Previous catheterization in 2004 showed no obstructive coronary disease. I last saw her on July of 2010. Since then the patient has dyspnea with more extreme activities but not with routine activities. It is relieved with rest. It is not associated with chest pain. There is no orthopnea, PND; occasional mild pedal edema. There is no syncope or palpitations. There is no exertional chest pain. She does occasionally feel a brief chest pressure that is not related to activities and has been intermittent for years.   Current Medications (verified): 1)  Metoprolol Tartrate 25 Mg  Tabs (Metoprolol Tartrate) .Marland Kitchen.. 1 By Mouth Two Times A Day 2)  Altace 10 Mg  Caps (Ramipril) .Marland Kitchen.. 1 By Mouth Two Times A Day 3)  Lasix 20 Mg Tabs (Furosemide) .Marland Kitchen.. 1 Once Daily 4)  Coumadin 5 Mg Tabs (Warfarin Sodium) .... 5 Mg Mon, Wed, Fri and 7.5 Mg The Rest of The Week. 5)  Caduet 10-20 Mg Tabs (Amlodipine-Atorvastatin)  .Marland Kitchen.. 1 By Mouth Once Daily 6)  Fexofenadine Hcl 60 Mg  Tabs (Fexofenadine Hcl) .... As Needed 7)  Klor-Con M10 10 Meq Cr-Tabs (Potassium Chloride Crys Cr) .... 2  By Mouth Once Daily or As Directed 8)  Vitamin D 1000 Unit  Tabs (Cholecalciferol) .... Once Daily 9)  Fluticasone Propionate 50 Mcg/act Susp (Fluticasone Propionate) .... 2 Sprays Each Nostril Each Day  Allergies (verified): 1)  ! Penicillin 2)  ! * Tetanus 3)  ! Diona Fanti  Past History:  Past Medical History: CARDIOMYOPATHY, PRIMARY NEC (ICD-425.4) improved HYPERTENSION (ICD-401.9) AORTIC INSUFFICIENCY MODERATE (ICD-424.1) HYPERLIPIDEMIA (ICD-272.2) COLONIC POLYPS, HX OF COLONOSC 05 (ICD-V12.72) DVT, HX OF (ICD-V12.51)  right leg by report  ASTHMA (ICD-493.90) ALLERGIC RHINITIS (ICD-477.9) COUMADIN THERAPY (ICD-V58.61) G4 P2  Consults Dr. Stanford Breed- Cardio  Dr. Kendall Flack  Past Surgical History: Reviewed history from 01/26/2009 and no changes required. Hysterectomy Rt Knee Arthoscopic D & C (several) Cardiac Cath  colonoscopy 2005 with biopsy.  Social History: Reviewed history from 10/23/2008 and no changes required. 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    Alcohol use-yes Drug use-no Regular exercise-no  5 hours sleep lives with a son age 79 no pets.       Review of Systems       no fevers or chills, productive cough, hemoptysis, dysphasia, odynophagia, melena, hematochezia, dysuria, hematuria, rash, seizure activity,  orthopnea, PND,  claudication. Remaining systems are negative.   Vital Signs:  Patient profile:   60 year old female Menstrual status:  hysterectomy Height:      66.25 inches Weight:      195 pounds BMI:     31.35 Pulse rate:   50 / minute BP sitting:   136 / 83  (left arm) Cuff size:   regular  Vitals Entered By: Mignon Pine, RMA (June 30, 2010 8:43 AM)  Physical Exam  General:  Well-developed well-nourished in no acute distress.  Skin  is warm and dry.  HEENT is normal.  Neck is supple. No thyromegaly.  Chest is clear to auscultation with normal expansion.  Cardiovascular exam is regular rate and rhythm.  Abdominal exam nontender or distended. No masses palpated. Extremities show no edema. neuro grossly intact    EKG  Procedure date:  06/30/2010  Findings:      Sinus rhythm at a rate of 50. Inferior and anterior T-wave inversion.  Impression & Recommendations:  Problem # 1:  CARDIOMYOPATHY, PRIMARY NEC (ICD-425.4) Improved on most recent echocardiogram. Repeat study. Her updated medication list for this problem includes:    Metoprolol Tartrate 25 Mg Tabs (Metoprolol tartrate) .Marland Kitchen... 1 by mouth two times a day    Altace 10 Mg Caps (Ramipril) .Marland Kitchen... 1 by mouth two times a day    Lasix 20 Mg Tabs (Furosemide) .Marland Kitchen... 1 once daily    Coumadin 5 Mg Tabs (Warfarin sodium) .Marland KitchenMarland KitchenMarland KitchenMarland Kitchen 5 mg mon, wed, fri and 7.5 mg the rest of the week.  Problem # 2:  HYPERTENSION (ICD-401.9) Blood pressure controlled on present medications. Will continue. Potassium and renal function monitored by primary care. Her updated medication list for this problem includes:    Metoprolol Tartrate 25 Mg Tabs (Metoprolol tartrate) .Marland Kitchen... 1 by mouth two times a day    Altace 10 Mg Caps (Ramipril) .Marland Kitchen... 1 by mouth two times a day    Lasix 20 Mg Tabs (Furosemide) .Marland Kitchen... 1 once daily    Caduet 10-20 Mg Tabs (Amlodipine-atorvastatin) .Marland Kitchen... 1 by mouth once daily  Orders: EKG w/ Interpretation (93000)  Problem # 3:  AORTIC INSUFFICIENCY MODERATE (ICD-424.1)  Repeat echocardiogram. Her updated medication list for this problem includes:    Metoprolol Tartrate 25 Mg Tabs (Metoprolol tartrate) .Marland Kitchen... 1 by mouth two times a day    Altace 10 Mg Caps (Ramipril) .Marland Kitchen... 1 by mouth two times a day    Lasix 20 Mg Tabs (Furosemide) .Marland Kitchen... 1 once daily  Her updated medication list for this problem includes:    Metoprolol Tartrate 25 Mg Tabs (Metoprolol tartrate) .Marland Kitchen... 1  by mouth two times a day    Altace 10 Mg Caps (Ramipril) .Marland Kitchen... 1 by mouth two times a day    Lasix 20 Mg Tabs (Furosemide) .Marland Kitchen... 1 once daily  Problem # 4:  HYPERLIPIDEMIA (ICD-272.2) Continue statin. Lipids and liver monitored by primary care. Her updated medication list for this problem includes:    Caduet 10-20 Mg Tabs (Amlodipine-atorvastatin) .Marland Kitchen... 1 by mouth once daily  Her updated medication list for this problem includes:    Caduet 10-20 Mg Tabs (Amlodipine-atorvastatin) .Marland Kitchen... 1 by mouth once daily  Problem # 5:  DVT, HX OF (ICD-V12.51)  Check PT at patient's request. Her updated medication list for this problem includes:    Coumadin 5 Mg Tabs (Warfarin sodium) .Marland KitchenMarland KitchenMarland KitchenMarland Kitchen 5 mg mon, wed, fri and 7.5 mg the rest of the week.  Her updated medication list for  this problem includes:    Coumadin 5 Mg Tabs (Warfarin sodium) .Marland KitchenMarland KitchenMarland KitchenMarland Kitchen 5 mg mon, wed, fri and 7.5 mg the rest of the week.  Other Orders: Echocardiogram (Echo)  Patient Instructions: 1)  Your physician recommends that you schedule a follow-up appointment in: [place] 2)  Your physician recommends that you continue on your current medications as directed. Please refer to the Current Medication list given to you today. 3)  Your physician has requested that you have an echocardiogram.  Echocardiography is a painless test that uses sound waves to create images of your heart. It provides your doctor with information about the size and shape of your heart and how well your heart's chambers and valves are working.  This procedure takes approximately one hour. There are no restrictions for this procedure. 4)  Your physician recommends that you return for lab work [id number] CLINIC TODAY PT PTT

## 2010-11-01 NOTE — Letter (Signed)
Summary: Generic Letter  [facility]   [place], [place] [address]   Phone: [phone]  Fax: [phone]    02/08/2010  [place] [address], [place]  [address]  Dear Ms. HASKINS,  We seen that you cancelled your last lab and follow up with Dr. Regis Bill. You are on medications that require lab monitoring for your health and safety. Please contact our office as soon as possible to reschedule these appointments. If you are unable to do this, please call us to let us know how you are doing.          Sincerely,   Paul Half, CMA (AAMA)  Appended Document: Generic Letter I redone the letter and didn't send this one. I sent the other one after discussing this more with Dr. Regis Bill.

## 2010-11-01 NOTE — Miscellaneous (Signed)
Summary: Lab results   Clinical Lists Changes  Observations: Added new observation of CHOL/HDL: 2.6  (04/25/2010 8:26) Added new observation of VIT D 25-OH: 33.8 ng/mL (01/03/2010 9:55) Added new observation of TRIGLYCERIDE: 92 mg/dL (01/03/2010 8:34) Added new observation of HDL: 55 mg/dL (01/03/2010 8:34) Added new observation of LDL: 70 mg/dL (01/03/2010 8:34) Added new observation of CHOLESTEROL: 145 mg/dL (01/03/2010 8:33) Added new observation of SGPT (ALT): 16 units/L (01/03/2010 8:33) Added new observation of SGOT (AST): 27 units/L (01/03/2010 8:33) Added new observation of CALCIUM: 9.7 mg/dL (01/03/2010 8:32) Added new observation of CO2 PLSM/SER: 25 meq/L (01/03/2010 8:32) Added new observation of CL SERUM: 106 meq/L (01/03/2010 8:32) Added new observation of K SERUM: 3.6 meq/L (01/03/2010 8:32) Added new observation of NA: 143 meq/L (01/03/2010 8:32) Added new observation of CREATININE: 1.2 mg/dL (01/03/2010 8:32) Added new observation of BUN: 1 mg/dL (01/03/2010 8:32) Added new observation of BG RANDOM: 96 mg/dL (01/03/2010 8:32) Added new observation of HCT: 40.2 % (01/03/2010 8:27) Added new observation of HGB: 13.4 g/dL (01/03/2010 8:27) Added new observation of TSH: 1.900 microintl units/mL (01/03/2010 8:26) Added new observation of INR: 2.21  (01/03/2010 8:26) Added new observation of PT PATIENT: 26.4 s (01/03/2010 8:26)      -  Date:  01/03/2010    HGB: 13.4    HCT: 40.2    BG Random: 96    BUN: 1    Creatinine: 1.2    Sodium: 143    Potassium: 3.6    Chloride: 106    CO2 Total: 25    Calcium: 9.7    SGOT (AST): 27    SGPT (ALT): 16    Cholesterol: 145    LDL: 70    HDL: 55    Triglycerides: 92

## 2010-11-01 NOTE — Letter (Signed)
Summary: Generic Letter  [facility]   [place], [place] [address]   Phone: [phone]  Fax: [phone]    08/18/2010  [facility] [address] [place], [place]  [address]  Dear Ms. HASKINS,  We have tried to call you to schedule another protime appointment. Please give our office a call to schedule this appointment at [phone].         Sincerely,   Paul Half, CMA (AAMA)

## 2010-11-03 NOTE — Assessment & Plan Note (Signed)
Summary: pt/cjr   Nurse Visit   Allergies: 1)  ! Penicillin 2)  ! * Tetanus 3)  ! [place] Laboratory Results   Blood Tests      INR: 3.2   (Normal Range: 0.88-1.12   Therap INR: 2.0-3.5) Comments: Joyce Gross  September 08, 2010 2:43 PM     Orders Added: 1)  Est. Patient Level I [99211] 2)  Protime [id number]   ANTICOAGULATION RECORD PREVIOUS REGIMEN & LAB RESULTS Anticoagulation Diagnosis:  DVT on  06/30/2010 Previous INR Goal Range:  2.0-3.0 on  03/24/2008 Previous INR:  3.3 on  07/13/2010 Previous Coumadin Dose(mg):  5mg  on  06/30/2010 Previous Regimen:  same on  07/13/2010 Previous Coagulation Comments:  BY DR.TODD on  05/19/2008  NEW REGIMEN & LAB RESULTS Current INR: 3.2 Regimen: same  Repeat testing in: 4 weeks  Anticoagulation Visit Questionnaire Coumadin dose missed/changed:  No Abnormal Bleeding Symptoms:  No  Any diet changes including alcohol intake, vegetables or greens since the last visit:  No Any illnesses or hospitalizations since the last visit:  No Any signs of clotting since the last visit (including chest discomfort, dizziness, shortness of breath, arm tingling, slurred speech, swelling or redness in leg):  Yes  MEDICATIONS METOPROLOL TARTRATE 25 MG  TABS (METOPROLOL TARTRATE) 1 by mouth two times a day ALTACE 10 MG  CAPS (RAMIPRIL) 1 by mouth two times a day LASIX 20 MG TABS (FUROSEMIDE) 1 once daily COUMADIN 5 MG TABS (WARFARIN SODIUM) 5 mg mon, wed, fri and 7.5 mg the rest of the week. CADUET 10-20 MG TABS (AMLODIPINE-ATORVASTATIN) 1 by mouth once daily FEXOFENADINE HCL 60 MG  TABS (FEXOFENADINE HCL) as needed KLOR-CON M10 10 MEQ CR-TABS (POTASSIUM CHLORIDE CRYS CR) 2  by mouth once daily or as directed VITAMIN D 1000 UNIT  TABS (CHOLECALCIFEROL) once daily FLUTICASONE PROPIONATE 50 MCG/ACT SUSP (FLUTICASONE PROPIONATE) 2 sprays each nostril each day

## 2010-11-03 NOTE — Assessment & Plan Note (Signed)
Summary: pt inr/cjr   Nurse Visit   Allergies: 1)  ! Penicillin 2)  ! * Tetanus 3)  ! [facility] Laboratory Results   Blood Tests   Date/Time Received: October 14, 2010 2:49 PM  Date/Time Reported: October 14, 2010 2:49 PM    INR: 2.4   (Normal Range: 0.88-1.12   Therap INR: 2.0-3.5) Comments: Doy Hutching, CMA  October 14, 2010 2:49 PM     Orders Added: 1)  New Patient Level I [id number] 2)  Protime [id number]  Laboratory Results   Blood Tests      INR: 2.4   (Normal Range: 0.88-1.12   Therap INR: 2.0-3.5) Comments: Doy Hutching, CMA  October 14, 2010 2:49 PM         ANTICOAGULATION RECORD PREVIOUS REGIMEN & LAB RESULTS Anticoagulation Diagnosis:  DVT on  06/30/2010 Previous INR Goal Range:  2.0-3.0 on  03/24/2008 Previous INR:  3.2 on  09/08/2010 Previous Coumadin Dose(mg):  5mg  on  06/30/2010 Previous Regimen:  same on  09/08/2010 Previous Coagulation Comments:  BY DR.TODD on  05/19/2008  NEW REGIMEN & LAB RESULTS Current INR: 2.4 Current Coumadin Dose(mg): 5mg  on m,w,f 7.5mg  on other days Regimen: same  (no change)       Repeat testing in: 4 weeks MEDICATIONS METOPROLOL TARTRATE 25 MG  TABS (METOPROLOL TARTRATE) 1 by mouth two times a day ALTACE 10 MG  CAPS (RAMIPRIL) 1 by mouth two times a day LASIX 20 MG TABS (FUROSEMIDE) 1 once daily COUMADIN 5 MG TABS (WARFARIN SODIUM) 5 mg mon, wed, fri and 7.5 mg the rest of the week. CADUET 10-20 MG TABS (AMLODIPINE-ATORVASTATIN) 1 by mouth once daily FEXOFENADINE HCL 60 MG  TABS (FEXOFENADINE HCL) as needed KLOR-CON M10 10 MEQ CR-TABS (POTASSIUM CHLORIDE CRYS CR) 2  by mouth once daily or as directed VITAMIN D 1000 UNIT  TABS (CHOLECALCIFEROL) once daily FLUTICASONE PROPIONATE 50 MCG/ACT SUSP (FLUTICASONE PROPIONATE) 2 sprays each nostril each day   Anticoagulation Visit Questionnaire      Coumadin dose missed/changed:  No      Abnormal Bleeding Symptoms:  No   Any diet changes including alcohol  intake, vegetables or greens since the last visit:  No Any illnesses or hospitalizations since the last visit:  No Any signs of clotting since the last visit (including chest discomfort, dizziness, shortness of breath, arm tingling, slurred speech, swelling or redness in leg):  No

## 2010-11-10 ENCOUNTER — Other Ambulatory Visit: Payer: Self-pay | Admitting: Internal Medicine

## 2010-11-10 NOTE — Telephone Encounter (Signed)
Mailed letter to pt to call office to schedule a follow up appt.

## 2010-11-17 ENCOUNTER — Ambulatory Visit (INDEPENDENT_AMBULATORY_CARE_PROVIDER_SITE_OTHER): Payer: PRIVATE HEALTH INSURANCE | Admitting: Internal Medicine

## 2010-11-17 DIAGNOSIS — I82409 Acute embolism and thrombosis of unspecified deep veins of unspecified lower extremity: Secondary | ICD-10-CM

## 2010-11-17 MED ORDER — WARFARIN SODIUM 5 MG PO TABS: ORAL_TABLET | ORAL | Status: DC

## 2010-12-08 ENCOUNTER — Other Ambulatory Visit: Payer: Self-pay | Admitting: Internal Medicine

## 2010-12-08 NOTE — Telephone Encounter (Signed)
rx sent to pharmacy and letter sent to pt to schedule a follow up appt before next refill.

## 2010-12-13 ENCOUNTER — Other Ambulatory Visit: Payer: Self-pay | Admitting: Internal Medicine

## 2010-12-16 ENCOUNTER — Other Ambulatory Visit (INDEPENDENT_AMBULATORY_CARE_PROVIDER_SITE_OTHER): Payer: PRIVATE HEALTH INSURANCE

## 2010-12-16 DIAGNOSIS — I82409 Acute embolism and thrombosis of unspecified deep veins of unspecified lower extremity: Secondary | ICD-10-CM

## 2010-12-25 ENCOUNTER — Other Ambulatory Visit: Payer: Self-pay | Admitting: Internal Medicine

## 2011-01-10 ENCOUNTER — Other Ambulatory Visit: Payer: Self-pay | Admitting: Internal Medicine

## 2011-01-17 ENCOUNTER — Ambulatory Visit (INDEPENDENT_AMBULATORY_CARE_PROVIDER_SITE_OTHER): Payer: PRIVATE HEALTH INSURANCE | Admitting: Internal Medicine

## 2011-01-17 DIAGNOSIS — I82409 Acute embolism and thrombosis of unspecified deep veins of unspecified lower extremity: Secondary | ICD-10-CM

## 2011-01-17 LAB — POCT INR: INR: 2.8

## 2011-01-17 NOTE — Patient Instructions (Signed)
Same dose 

## 2011-02-10 ENCOUNTER — Other Ambulatory Visit: Payer: Self-pay | Admitting: Internal Medicine

## 2011-02-10 NOTE — Telephone Encounter (Signed)
Pt needs a follow up appt. Note sent to pt.

## 2011-02-14 NOTE — Assessment & Plan Note (Signed)
[facility] OFFICE NOTE   NAME:HASKINS, ALEACIA                       MRN:          [id number]  DATE:01/22/2008                            DOB:          Feb 26, 1951    HISTORY:  [name] returns for followup today.  She is very pleasant  female who has a history of nonischemic cardiomyopathy improved by most  recent echocardiogram.  Her last echo was performed on July 25, 2007.  At that time, her ejection fraction was felt to be 50%.  There was  moderate aortic insufficiency and mild mitral regurgitation.  She also  has a history of aortic insufficiency as described above.  When I last  saw her, we scheduled her to have a CT of her aortic root to make sure  that it was not significantly dilated and there was no aneurysm.  She  also has hypertension.  She also has had a recent Myoview in November  that showed an ejection fraction of 46%.  There was soft tissue  attenuation versus small prior anterior infarct, but there was no  ischemia.  Note, she did have a previous catheterization that showed no  significant coronary artery disease and that was performed in March  2004.  Since I last saw her, she appears to be doing reasonably well.  She does describe occasional dizzy spells.  These are sudden in onset  and not related to position.  They last for several seconds.  She states  she feels like she will pass out, but then they resolve.  There is no  associated chest pain, shortness of breath or palpitations.  She has had  these intermittently for quite some time.  They occur almost on a daily  basis.  She has mild dyspnea with more extreme activities, but not with  routine activities.  She does have mild pedal edema.  She occasionally  feels chest heaviness when she feels significantly short of breath, but  otherwise does not have exertional chest pain.   MEDICATIONS:  1. Altace 10 mg p.o. b.i.d.  2. Lasix 20  mg p.o. daily.  3. Coumadin as directed.  4. Metoprolol 25 mg p.o. b.i.d.  5. Caduet 10/20 daily.   PHYSICAL EXAMINATION:  VITAL SIGNS:  Blood pressure 125/66 and pulse is  60.  She weighs 214 pounds.  HEENT:  Normal.  NECK:  Supple with no bruit.  CHEST:  Clear.  CARDIOVASCULAR:  Regular regular and rhythm.  There is 2/6 systolic  murmur at the left sternal border.  I cannot appreciate a diastolic  murmur.  ABDOMEN:  Shows no tenderness.  EXTREMITIES:  Show no edema.   DIAGNOSTICS:  Her electrocardiogram shows a sinus rhythm at a rate of  58.  There is left ventricle hypertrophy and there are anterior and  inferior T-wave changes.   DIAGNOSES:  1. Nonischemic cardiomyopathy - this is possibly felt secondary to      hypertension.  Most recent echocardiogram showed an ejection      fraction of 50%.  She will continue on her Altace, diuretic  and      beta blocker.  She is being compliant with her medications.  2. Aortic insufficiency - she will need a follow up echocardiogram in      October.  Note, her CTA showed no aneurysm of her aortic root.  3. Hypertension - her blood pressure is adequately controlled.  I will      check a BMET today to follow potassium and renal function.  4. Recent presyncopal episodes - she states these happen almost on a      daily basis.  I will schedule her to have a 48 hour Holter monitor      to make sure that she is not having significant arrhythmias as the      cause.  5. History of deep venous thrombosis  - she is on Coumadin.  This will      be managed by Dr. Regis Bill.  6. Hyperlipidemia - this is also being managed by Dr. Regis Bill.   FOLLOW UP:  We will see her back in 6 months.     Denice Bors Stanford Breed, MD, [facility]  Electronically Signed    BSC/MedQ  DD: 01/22/2008  DT: 01/22/2008  Job #: [id number]   cc:   Standley Brooking. Regis Bill, MD

## 2011-02-14 NOTE — Assessment & Plan Note (Signed)
[facility] OFFICE NOTE   NAME:Foster, [name]                       MRN:          [id number]  DATE:07/30/2007                            DOB:          1950/10/09    HISTORY:  Ms. [name] Foster is a very pleasant 60 year old female whom I am  asked to evaluate for aortic insufficiency and a history of non-ischemic  cardiomyopathy.  Note that the patient has previously been followed by  Dr. Fabio Asa.  The patient apparently has a history of non-ischemic  cardiomyopathy.  I do have a catheterization report from December 16, 2002.  At that time her ejection fraction was 30%-35%, with no mitral  regurgitation.  There was moderate aortic insufficiency.  There was no  obstructive coronary artery disease noted.  The etiology of her  cardiomyopathy apparently was unclear and felt possibly secondary to  hypertension or virally-mediated.  I have an echocardiogram from December 30, 2003, that was interpreted by Dr. Jaci Standard.  Apparently at that time  the patient's LV function had returned to normal.  There was mild to  moderate aortic insufficiency.   Note:  Apparently the patient recently discontinued her blood pressure  medicines and she stated her blood pressure is in the 210/100 range.  She began to feel short of breath with exertion.  There was no orthopnea  or PND, but she does occasionally have mild pedal edema.  She therefore  followed up with Dr. Standley Brooking. Panosh and blood pressure medicines  apparently were reinstituted.  She had an echocardiogram ordered, which  was performed on July 25, 2007.  Her left ventricular end diastolic  dimension was 52 and end systolic dimension was 41.  Her ejection  fraction was felt to be in the low normal range at 50%.  There was  moderate aortic insufficiency and mild mitral regurgitation.  There was  mild left atrial enlargement.  Because of the above, we were asked to  further  evaluate.   Note:  The patient has also had occasional chest pain.  The pain is in  the left upper chest area.  It is not exertional, pleuritic or  positional.  There are no associated symptoms.  It usually lasts for  five to 10 minutes and resolves spontaneously.  It is not related to  food.   She also has had some problems with dizzy spells.  There are no  associated palpitations.  She did have a syncopal episode approximately  three years ago but apparently there was nothing found on further workup  by Dr. Jaci Standard.  I do not have those records available.  She has not had  a syncopal episode in three years.   CURRENT MEDICATIONS:  1. Norvasc 10 mg p.o. daily.  2. Toprol 50 mg p.o. daily.  3. Altace 10 mg p.o. twice daily.  4. Lasix 20 mg p.o. daily.  5. Coumadin as directed.   ALLERGIES:  PENICILLIN, ASPIRIN (rash), and TETANUS TOXOID.   SOCIAL HISTORY:  She does smoke.  She only rarely consumes alcohol.   FAMILY HISTORY:  Negative for coronary artery disease.   PAST MEDICAL/SURGICAL HISTORY:  1. Significant for hypertension.  2. Recently diagnosed hyperlipidemia.  There is no diabetes mellitus.  3. She has a history of a non-ischemic cardiomyopathy as described in      the HPI.  4. Aortic insufficiency as described in the HPI.  5. She has a history of asthma.  6. History of a deep venous thrombosis and she is on Coumadin at this      time.  7. She has a history of polyps.  8. She has had a previous hysterectomy.  9. A previous arthroscopic knee surgery.   REVIEW OF SYSTEMS:  She denies any headaches or fever or chills.  There  is no productive cough or hemoptysis.  There is no dysphagia,  odynophagia, melena or hematochezia.  There is no dysuria or hematuria.  There is rash.  There is no seizure activity.  There is no orthopnea or  PND.  There can occasionally be mild pedal edema.  The remainder of the  review of systems is negative.   PHYSICAL EXAMINATION:  VITAL  SIGNS:  Blood pressure 134/84, pulse 63,  weight 212 pounds.  GENERAL:  She is well-developed and well-nourished, in no acute  distress.  SKIN:  Warm and dry.  NEUROLOGIC:  She is not depressed.  The neurologic exam is grossly  intact.  EXTREMITIES:  There is no peripheral clubbing.  She has 2+ femoral  pulses bilaterally, no bruits.  Extremities show no edema.  I can  palpate no cords.  She has 2+ posterior tibial pulses bilaterally.  BACK:  Normal.  HEENT:  Normal with normal eye lids.  NECK:  Supple with normal upstroke bilaterally, no bruits.  No jugular  venous distention and no thyromegaly noted.  CHEST:  Clear to auscultation, normal expansion.  CARDIOVASCULAR:  A regular rate and rhythm.  Normal S1 and S2.  There is  a 2/6 systolic murmur in the left sternal border.  There is a faint 1/6  diastolic murmur noted.  There is no S3 or S4.  ABDOMEN:  Nontender.  Positive bowel sounds.  No hepatosplenomegaly, no  masses appreciated.  There is no abdominal bruit.   An electrocardiogram shows a sinus rhythm at a rate of 63.  There is  left ventricular hypertrophy with nonspecific ST changes.   DIAGNOSES/PLAN:  1. Non-ischemic cardiomyopathy:  This may be secondary to      hypertension.  She did state that her blood pressure was 210/100      when she stopped taking her medications.  It is now much-improved.      Her ejection fraction on a recent echocardiogram was 50%.  We will      continue with her Altace, diuretic and beta blocker.  I have      explained the importance of medication compliance.  2. Aortic insufficiency:  Her most recent echocardiogram showed      moderate aortic insufficiency.  I have also explained that      hypertension would exacerbate this.  She again will continue on her      present medications.  We will need to repeat an echocardiogram in      one year.  I will schedule her to have a CTA of  her aortic root,      to make sure that it is not significantly  dilated.  The etiology      for her aortic insufficiency has not been determined previously.  3. Hypertension:  Her blood pressure appears to be adequately      controlled at this point.  4. Chest pain:  We will schedule her to have a Myoview for risk      stratification.  Note that her coronaries were normal in 2004.  5. History of deep venous thrombosis:  She is on Coumadin, and this      will be managed by Dr. Regis Bill.  6. Hyperlipidemia:  Her LDL has been greater than 170 recently.  She      will most likely need a statin in the future, but I will leave this      to Dr. Regis Bill.   If her CTA and Myoview are unremarkable, then we will plan to see her  back in six months, to review her symptoms.     Denice Bors Stanford Breed, MD, [facility]  Electronically Signed    BSC/MedQ  DD: 07/30/2007  DT: 07/30/2007  Job #: [id number]   cc:   Standley Brooking. Regis Bill, MD

## 2011-02-14 NOTE — Assessment & Plan Note (Signed)
[facility] OFFICE NOTE   NAME:HASKINS, JANIRIS                       MRN:          [id number]  DATE:07/17/2008                            DOB:          [birth year]    HISTORY OF PRESENT ILLNESS:  [name] Foster is a pleasant 60 year old  female who has a history of nonischemic cardiomyopathy improved by most  recent echocardiogram.  She also has aortic insufficiency.  Her most  recent echo was performed on July 25, 2007.  At that time, she was  found to have an ejection fraction of 50%.  There was moderate aortic  insufficiency and mild mitral regurgitation.  She also has had a CT of  her chest that showed no thoracic aneurysm.  She has been treated  medically.  Note, when I saw her previously, she did have a Holter  monitor secondary to dizzy spells that showed signs of PACs and PVCs.  Also, note that her most recent Myoview was performed on August 07, 2007.  Her ejection fraction was 46% and there was soft tissue  attenuation versus prior anterior infarct, but no ischemia.  Previous  catheterization in 2004 showed no obstructive coronary disease.  Since I  last saw her, she does have some dyspnea on exertion but this is  predominant with more extreme exertion.  This is not typically with  routine activities around the house.  There is no orthopnea or PND, but  she has had increased pedal edema for the past month.  She occasionally  feels a tightness in the chest when she ambulates, but this has been  going on for several years.  She is not having symptoms at rest.  They  did not radiate.  There are not pleuritic positional.   MEDICATIONS:  1. Altace 10 mg p.o. b.i.d.  2. Lasix 20 p.o. mg p.o. daily.  3. Coumadin as directed.  4. Metoprolol 25 mg p.o. b.i.d.  5. Caduet 10/20 daily.   PHYSICAL EXAMINATION:  VITAL SIGNS:  Today, blood pressure of 138/90 and  pulse is 55.  She weighs 222 pounds.  HEENT:   Normal.  NECK:  Supple.  CHEST:  Clear.  CARDIOVASCULAR:  Regular rhythm.  I cannot appreciate a diastolic  murmur.  ABDOMEN:  No tenderness.  EXTREMITIES:  1+ edema.   Her electrocardiogram shows a sinus rhythm at rate of 55.  There is left  ventricular hypertrophy.  There is anterior T-wave inversion as well as  inferior.  The anterior T-wave inversions is unchanged from previous.   DIAGNOSES:  1. History of nonischemic cardiomyopathy - this was improved by most      recent echocardiogram.  We will plan to repeat her echo to reassess      her left ventricular function, and she will continue on her ACE      inhibitor, diuretic, and beta-blocker.  2. Edema - we will plan to repeat her echo as described above both for      aortic insufficiency and echocardiogram.  I have considered whether      the pedal  edema may be related to her Caduet.  However, she was on      Norvasc previously and her edema was not as severe.  I therefore do      not think this is most likely the cause.  We will increase her      Lasix 40 mg p.o. daily in 1 week.  I will check a BMET and a BMP.  3. Aortic insufficiency - she will need a followup echocardiogram as      described above.  4. Hypertension - her blood pressure is mildly elevated today.      However, she still states it is was in the 120/70 range at home.  I      have asked her to continue to track this and we will adjust her      regimen as indicated.  5. History of deep vein thrombosis - she will continue on her      Coumadin.  This is managed by Dr. Regis Bill.  6. Hyperlipidemia - she will continue her statin.  This is also being      managed by Dr. Regis Bill.   PLAN:  We will see her back in approximately 6 months.     Denice Bors Stanford Breed, MD, [facility]  Electronically Signed    BSC/MedQ  DD: 07/17/2008  DT: 07/17/2008  Job #: [id number]   cc:   Standley Brooking. Regis Bill, MD

## 2011-02-17 NOTE — Discharge Summary (Signed)
NAME:  [name] Foster, [name] Foster                        ACCOUNT NO.:  [id number]   MEDICAL RECORD NO.:  [id number]                   PATIENT TYPE:  INP   LOCATION:  5533                                 FACILITY:  [facility]   PHYSICIAN:  Earlie Server. Tama Gander, M.D.                DATE OF BIRTH:  02/06/1951   DATE OF ADMISSION:  DATE OF DISCHARGE:                                 DISCHARGE SUMMARY   DISCHARGE DIAGNOSES:  1. New onset congestive heart failure, stable.  2. Aortic insufficiency.   DISCHARGE MEDICATIONS:  1. Altace 5 mg p.o. every day.  2. Toprol XL 50 mg p.o. every day.  3. Lasix 40 mg p.o. every day.  4. K-Dur 20 mg p.o. every day.   ALLERGIES:  1. PENICILLIN.  2. TETRACYCLINE.  3. ASPIRIN.   PROCEDURES:  None.   HISTORY OF PRESENT ILLNESS:  An otherwise healthy 60 year old female  presents with a history of being off her antihypertensive medication for the  past 7 months. She recently moved here and has not established herself with  a new physician. Over the past month she has had mild shortness of breath  with activity. Over the past week she has had orthopnea and pedal edema as  well as a dry cough. She felt very short of breath with minimal exertion.  She denies chest pain but does have a fullness in her chest, no fevers, no  recent weight loss. The rest of the review of systems was negative.   In the emergency room a 12-lead EKG revealed sinus rhythm with left  ventricular hypertrophy and left atrial enlargement, inverted T-waves in the  anterolateral leads. She is admitted for further evaluation and treatment.   HOSPITAL COURSE:  The patient was admitted to a telemetry bed and ruled out  for MI with 3 sets of cardiac enzymes, all of  which were negative, however,  the patient's BNP was 267.0, indicating mild congestive heart failure. The  patient was  treated with diuretics, antihypertensives with good results. A  spiral CT scan was performed to rule out PE and it was  found to be negative.   The patient underwent a 2D echocardiogram which revealed an ejection  fraction of 30% to 35% with moderate diffuse left ventricular hypokinesis,  basal inferior wall akinetic, aortic insufficiency 2 to 3/5, small  pericardial effusion, right mild atrial chamber collapse and questionable  aortic dissection. A chest CT scan revealed no evidence of aortic dissection  as followup for that finding.   The patient's case was discussed with Dr. Fabio Asa of [facility].  She agreed with the course of treatment and recommended that the patient see  her on an outpatient basis for further workup, possible cardiac  catheterization and questionable valve replacement.   At the time of discharge vital signs were 97.5, blood pressure 143/76, heart  rate 55, respirations 18. The patient is free of  shortness of breath or  chest pain. She states that she feels much better and her orthopnea has  resolved.   LABORATORY DATA:  Sodium 142, potassium 4.0, glucose 87, BUN 19, creatinine  1.5. White blood cell count 6.9, hemoglobin 14.1, hematocrit 42.8, platelet  count 166,000. TSH 1.57. Total cholesterol 216, triglycerides 97, HDL 67,  LDL 130.   CONSULTS:  None.   CONDITION ON DISCHARGE:  Good.   DISPOSITION:  Discharged to home.   FOLLOW UP:  Followup is to be with Dr. Fabio Asa, [facility]  sometime in the next week to 10 days.  The patient is provided with an  office phone number as the office is not open today for this to be arranged.  The patient is also strongly encouraged to establish herself with a primary  care physician. The patient will need followup of her dyslipidemia as well.     Jamison Neighbor. Miguel Rota. Tama Gander, M.D.    SMD/MEDQ  D:  12/07/2002  T:  12/08/2002  Job:  [id number]   cc:   Fabio Asa, M.D.  [address] [place]   [place] [address]  Fax: [phone]

## 2011-02-17 NOTE — Cardiovascular Report (Signed)
NAME:  [name] Foster, [name] Foster                        ACCOUNT NO.:  [id number]   MEDICAL RECORD NO.:  [id number]                   PATIENT TYPE:  OIB   LOCATION:  2899                                 FACILITY:  [facility]   PHYSICIAN:  Fabio Asa, M.D.                 DATE OF BIRTH:  03-07-1951   DATE OF PROCEDURE:  DATE OF DISCHARGE:  12/16/2002                              CARDIAC CATHETERIZATION   INDICATIONS FOR PROCEDURE:  Cardiomyopathy, ejection fraction 35% with  atypical chest pain.  The patient has a past medical history of  hypertension.   DESCRIPTION OF PROCEDURE:  After obtaining written informed consent, the  patient was brought to the cardiac catheterization lab in the postabsorptive  state.  Preoperative sedation was achieved using IV Versed.  The right groin  was prepped and draped in the usual sterile fashion.  Local anesthesia was  achieved using 1% Xylocaine.  A 6-French hemostasis sheath was placed into  the right femoral artery using the modified Seldinger technique.  Selective  coronary angiography was performed using a JL4/JR4 Judkins catheter.  Multiple views were obtained.  All catheter exchanges were made over a  guidewire.  Single plane ventriculogram was made in the RAO position using a  6-French pigtail curved catheter.  The catheter was then pulled back to the  aortic root, and root shot was obtained in the LAO position.  We then  proceeded to attempt to obtain access into the right femoral vein.  The vein  could be entered but did not have sufficient pullback to advance the  guidewire.  The patient was given a total of 1 liter bolus of normal saline.  Access was then obtained into the left femoral vein.  Right heart  catheterization pressures were obtained.  Thermodilution cardiac output was  performed.  Fick cardiac output was performed.  The patient was then  transferred to the holding area where hemostasis was achieved using digital  pressure.   FINDINGS:  1. The aortic pressure was 152/81.  LV pressure was 148/10.  EDP was 18.     These pressures were obtained prior to the saline bolus.  2. Right heart catheterization pressures     A. RA mean was 15.     B. RV was 42/9.     C. Pulmonary artery pressure was 37/19.     D. Wedge pressure of 20.     E. Thermodilution cardiac output was 2.8 with a cardiac index of 1.4.        Fick cardiac output was 2.2 with a cardiac index of 1.1.     F. Aortic saturation was 93%.  PA saturation was 49%.  3. Single plane ventriculogram revealed a severe diffuse hypokinesis.  The     estimated ejection fraction was 30% to 35%.  There was no mitral     regurgitation noted.  4. The aortic root shot revealed mild ectasia.  There was moderate aortic     insufficiency noted.  5. Coronary angiography     A. The left main coronary artery bifurcated into the left anterior        descending and circumflex vessels.  There was no significant disease        in the left main coronary artery.     B. Left anterior descending:  The left anterior descending artery gave        rise to a small D1, small to moderate D2, small D3, small D4, and goes        on to end as an apical branch.  There was no disease noted in the left        anterior descending or its branches.     C. Circumflex vessel:  The circumflex vessel was codominant for the        posterior circulation, giving rise to a trivial OM1, trivial OM2, a        large OM3, and in the PDA branch.  There is no disease noted in the        circumflex or its branches.     D. Right coronary artery:  The right coronary artery was a moderate-sized        vessel, giving rise to 2 RV marginals.  There was no disease noted in        the right coronary artery or its branches.   IMPRESSION:  Nonischemic cardiomyopathy.  Etiologies include hypertension,  viral.  It is possible that the moderate aortic insufficiency is  contributing to her left ventricular dysfunction;  however, this does not  appear to be the prime etiology.  There is no significant widening of the  gradient between the systolic and diastolic blood pressure.  She currently  is compensated on her current medical regimen.  Will continue with Altace  and Toprol.  Will obtain a second opinion regarding her aortic  insufficiency.                                               Fabio Asa, M.D.    HP/MEDQ  D:  12/16/2002  T:  12/16/2002  Job:  [id number]

## 2011-02-17 NOTE — Op Note (Signed)
NAME:  [name] Foster, [name] Foster                  ACCOUNT NO.:  [id number]   MEDICAL RECORD NO.:  [id number]                   PATIENT TYPE:  AMB   LOCATION:  ENDO                                 FACILITY:  [facility]   PHYSICIAN:  Ronald Lobo, M.D.                DATE OF BIRTH:  1951/01/29   DATE OF PROCEDURE:  03/11/2004  DATE OF DISCHARGE:                                 OPERATIVE REPORT   PROCEDURE:  Colonoscopy with biopsy.   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  No worrisome symptoms.   FINDINGS:  Diminutive sessile polyp in the rectum, otherwise, normal exam.   PROCEDURE:  The nature, purpose, and risks of the procedure have been  discussed with the patient who provided written consent.  Sedation was  Fentanyl 70 mcg and Versed 7 mg IV prior to and during the course of the  procedure. The Olympus adult video colonoscope was advanced with some  difficulty due to angulation and kinking in the sigmoid region and some  tendency for looping, but ultimately with the patient in the supine position  and some external abdominal compression, I was able to reach the base of the  cecum by overriding loops and pull back was then performed.  The quality of  the prep was excellent and it was felt that all areas were well seen.  This  was a normal examination apart from a small sessile hyperplastic appearing  polyp at about 8 cm from the external anal opening.  It was removed by a  single cold biopsy.  No other polyps were seen and there was no evidence of  cancer, colitis, vascular malformations, or diverticulosis.  The patient  tolerated the procedure well and there were no apparent complications.   IMPRESSION:  1. Small rectal polyp removed as described above (211.4).  2. Family history of colon cancer.   PLAN:  Await pathology.  Follow up colonoscopy in five years  regardless of  the polyp pathology in view of the family history.                                               Ronald Lobo, M.D.    RB/MEDQ  D:  03/11/2004  T:  03/11/2004  Job:  [id number]   cc:   Deborah Chalk, M.D.  [address] [place]  [place] [address]  Fax: [phone]   Jerrye Beavers, M.D.

## 2011-03-12 ENCOUNTER — Other Ambulatory Visit: Payer: Self-pay | Admitting: Internal Medicine

## 2011-04-12 ENCOUNTER — Other Ambulatory Visit: Payer: Self-pay | Admitting: Internal Medicine

## 2011-05-15 ENCOUNTER — Other Ambulatory Visit: Payer: Self-pay | Admitting: Internal Medicine

## 2011-05-19 ENCOUNTER — Encounter: Payer: Self-pay | Admitting: Internal Medicine

## 2011-06-16 ENCOUNTER — Other Ambulatory Visit: Payer: Self-pay | Admitting: Internal Medicine

## 2011-07-16 ENCOUNTER — Other Ambulatory Visit: Payer: Self-pay | Admitting: Internal Medicine

## 2011-08-17 ENCOUNTER — Other Ambulatory Visit: Payer: Self-pay | Admitting: Internal Medicine

## 2011-09-18 ENCOUNTER — Other Ambulatory Visit: Payer: Self-pay | Admitting: Internal Medicine

## 2011-09-18 NOTE — Telephone Encounter (Signed)
Left message to call back  

## 2011-09-20 ENCOUNTER — Other Ambulatory Visit: Payer: Self-pay | Admitting: Internal Medicine

## 2011-09-20 NOTE — Telephone Encounter (Signed)
Pt aware that she needs an appt.

## 2011-09-28 ENCOUNTER — Encounter: Payer: Self-pay | Admitting: Internal Medicine

## 2011-09-29 ENCOUNTER — Ambulatory Visit (INDEPENDENT_AMBULATORY_CARE_PROVIDER_SITE_OTHER): Payer: PRIVATE HEALTH INSURANCE | Admitting: Internal Medicine

## 2011-09-29 ENCOUNTER — Encounter: Payer: Self-pay | Admitting: Internal Medicine

## 2011-09-29 VITALS — BP 112/70 | HR 66 | Wt 213.0 lb

## 2011-09-29 DIAGNOSIS — I82409 Acute embolism and thrombosis of unspecified deep veins of unspecified lower extremity: Secondary | ICD-10-CM

## 2011-09-29 DIAGNOSIS — R0601 Orthopnea: Secondary | ICD-10-CM

## 2011-09-29 DIAGNOSIS — Z720 Tobacco use: Secondary | ICD-10-CM | POA: Insufficient documentation

## 2011-09-29 DIAGNOSIS — I428 Other cardiomyopathies: Secondary | ICD-10-CM

## 2011-09-29 DIAGNOSIS — I110 Hypertensive heart disease with heart failure: Secondary | ICD-10-CM

## 2011-09-29 DIAGNOSIS — R0600 Dyspnea, unspecified: Secondary | ICD-10-CM

## 2011-09-29 DIAGNOSIS — Z7901 Long term (current) use of anticoagulants: Secondary | ICD-10-CM

## 2011-09-29 DIAGNOSIS — I509 Heart failure, unspecified: Secondary | ICD-10-CM

## 2011-09-29 DIAGNOSIS — J309 Allergic rhinitis, unspecified: Secondary | ICD-10-CM

## 2011-09-29 DIAGNOSIS — I10 Essential (primary) hypertension: Secondary | ICD-10-CM

## 2011-09-29 DIAGNOSIS — Z86718 Personal history of other venous thrombosis and embolism: Secondary | ICD-10-CM

## 2011-09-29 DIAGNOSIS — F17200 Nicotine dependence, unspecified, uncomplicated: Secondary | ICD-10-CM

## 2011-09-29 LAB — CBC WITH DIFFERENTIAL/PLATELET
Basophils Relative: 0.5 % (ref 0.0–3.0)
Eosinophils Relative: 1 % (ref 0.0–5.0)
HCT: 42.3 % (ref 36.0–46.0)
MCV: 87.9 fl (ref 78.0–100.0)
Monocytes Absolute: 0.4 10*3/uL (ref 0.1–1.0)
Monocytes Relative: 5 % (ref 3.0–12.0)
Neutrophils Relative %: 56.1 % (ref 43.0–77.0)
RBC: 4.82 Mil/uL (ref 3.87–5.11)
WBC: 7 10*3/uL (ref 4.5–10.5)

## 2011-09-29 LAB — LIPID PANEL
LDL Cholesterol: 75 mg/dL (ref 0–99)
Total CHOL/HDL Ratio: 3
Triglycerides: 116 mg/dL (ref 0.0–149.0)
VLDL: 23.2 mg/dL (ref 0.0–40.0)

## 2011-09-29 LAB — HEPATIC FUNCTION PANEL
Bilirubin, Direct: 0.1 mg/dL (ref 0.0–0.3)
Total Bilirubin: 0.5 mg/dL (ref 0.3–1.2)

## 2011-09-29 LAB — BASIC METABOLIC PANEL
BUN: 12 mg/dL (ref 6–23)
Calcium: 9 mg/dL (ref 8.4–10.5)
Creatinine, Ser: 1 mg/dL (ref 0.4–1.2)

## 2011-09-29 NOTE — Patient Instructions (Addendum)
Will notify you  of labs when available. Will arrange  Cards to see you for follow   And addresss the sleeping interruption issue. Could be heart of sleep  Apnea or  gerd cause.  Plan fu depending on labs or CPX in 6 months   Get monthly INRs and fax [place] results     Latest dosing instructions   Total Sun Mon Tue Wed Thu Fri Sat   45 7.5 mg 5 mg 7.5 mg 5 mg 7.5 mg 5 mg 7.5 mg    (5 mg1.5) (5 mg1) (5 mg1.5) (5 mg1) (5 mg1.5) (5 mg1) (5 mg1.5)

## 2011-09-29 NOTE — Progress Notes (Signed)
  Subjective:    Patient ID: [name] Foster, female    DOB: 13-Feb-1951, 60 y.o.   MRN: [id number]  HPI Patient comes in today for follow up of  multiple medical problems.   Since last visit over a year ago. It appears she is late on coming in for followup. However she states that she's been doing pretty well she still on her Coumadin and states that she is getting her INR checked at work although she hasn't faxed them over most recently. They have been in the 2.5 range Swelling after being on feet for a while. Bp is good  120/70 range , she states that she should be seen [facility] in the next month and last visit was over 6 months ago  And  Due for  another echo.  No se of meds. No change in medication. Laboratory studies were over a year ago perhaps Review of Systems Sleep 3 pillow  Difficult with time change and then after 3-4  Hours ;wakes up with feeling lik choking and sits up and like mucous in throat and gets up because of breathing and then doesn't go back to sleep tries to do other things. She has no specific reflux is not aware she has sleep apnea. Denies any change in exercise tolerance during the day.  No bleeding vision hearing problems. Has allergies needs refill on her Allegra  Past history family history social history reviewed in the electronic medical record.     Objective:   Physical Exam Well-developed well-nourished in no acute distress HEENT is grossly normal neck without masses thyromegaly or bruit no JVD sitting Chest:  Clear to A&P without wheezes rales or rhonchi CV:  S1-S2 no gallops or murmurs peripheral perfusion is normal Abdomen:  Sof,t normal bowel sounds without hepatosplenomegaly, no guarding rebound or masses no CVA tenderness No clubbing cyanosis   trc edema( socks on)  Neuro: Oriented x3 cranial nerves appear intact gait within normal limits no obvious abnormalities      Assessment & Plan:  HT /CM Apparently stable but uneven followup. Blood pressure  controlled today On brief questioning appears to have health care maintenance up-to-date. She has had a flu shot at work.  Chronic anticoagulation reported to be in range and no side effects but we don't have recent results today it is 1.9 booster dose of couple days and then recheck in 3 weeks.  Nocturnal wakening  unsure cause but some significant to me although she states it has been going on for a long time. Question PND versus sleep apnea versus reflux. She does sleep on 3 pillows. Hyperlipidemia needs labs checked Allergic rhinitis okay to refill medication  She is due for cardiology recheck we'll arrange this appointment and ask her cardiologist to address these symptoms.  Although it is the afternoon and she is not fasting will check laboratory studies today to update.   Did not address tobacco monitoring at this time prolonged visit related to all of above.

## 2011-10-04 ENCOUNTER — Other Ambulatory Visit: Payer: Self-pay | Admitting: Internal Medicine

## 2011-10-04 DIAGNOSIS — E875 Hyperkalemia: Secondary | ICD-10-CM

## 2011-10-04 DIAGNOSIS — E876 Hypokalemia: Secondary | ICD-10-CM

## 2011-10-04 DIAGNOSIS — E559 Vitamin D deficiency, unspecified: Secondary | ICD-10-CM

## 2011-10-04 DIAGNOSIS — E782 Mixed hyperlipidemia: Secondary | ICD-10-CM

## 2011-10-04 LAB — TSH: TSH: 1.02 u[IU]/mL (ref 0.35–5.50)

## 2011-10-04 NOTE — Progress Notes (Signed)
Quick Note:  Left message on pt's home and cell numbers. ______

## 2011-10-04 NOTE — Progress Notes (Signed)
Quick Note:  Pt aware of results. PT to get protime done on Friday with labs. ______

## 2011-10-05 ENCOUNTER — Other Ambulatory Visit: Payer: Self-pay | Admitting: Internal Medicine

## 2011-10-06 ENCOUNTER — Other Ambulatory Visit: Payer: Self-pay | Admitting: *Deleted

## 2011-10-06 MED ORDER — POTASSIUM CHLORIDE CRYS ER 20 MEQ PO TBCR: 20.0000 meq | EXTENDED_RELEASE_TABLET | Freq: Every day | ORAL | Status: DC

## 2011-10-06 NOTE — Telephone Encounter (Signed)
rx sent for kdur

## 2011-10-13 ENCOUNTER — Telehealth: Payer: Self-pay | Admitting: Internal Medicine

## 2011-10-13 ENCOUNTER — Ambulatory Visit: Payer: PRIVATE HEALTH INSURANCE

## 2011-10-13 DIAGNOSIS — E875 Hyperkalemia: Secondary | ICD-10-CM

## 2011-10-13 DIAGNOSIS — E782 Mixed hyperlipidemia: Secondary | ICD-10-CM

## 2011-10-13 DIAGNOSIS — E559 Vitamin D deficiency, unspecified: Secondary | ICD-10-CM

## 2011-10-13 DIAGNOSIS — I82409 Acute embolism and thrombosis of unspecified deep veins of unspecified lower extremity: Secondary | ICD-10-CM

## 2011-10-13 DIAGNOSIS — E876 Hypokalemia: Secondary | ICD-10-CM

## 2011-10-13 DIAGNOSIS — Z5181 Encounter for therapeutic drug level monitoring: Secondary | ICD-10-CM

## 2011-10-13 DIAGNOSIS — Z7901 Long term (current) use of anticoagulants: Secondary | ICD-10-CM

## 2011-10-13 LAB — BASIC METABOLIC PANEL
BUN: 13 mg/dL (ref 6–23)
Creatinine, Ser: 1 mg/dL (ref 0.4–1.2)
GFR: 72.06 mL/min (ref >60.00)
Glucose, Bld: 81 mg/dL (ref 70–99)
Potassium: 3.7 mEq/L (ref 3.5–5.1)

## 2011-10-13 NOTE — Telephone Encounter (Signed)
Please order metroprolol,ramproil,and caduet medication for patient.

## 2011-10-13 NOTE — Patient Instructions (Signed)
  Latest dosing instructions   Total Sun Mon Tue Wed Thu Fri Sat   47.5 7.5 mg 5 mg 7.5 mg 7.5 mg 7.5 mg 5 mg 7.5 mg    (5 mg1.5) (5 mg1) (5 mg1.5) (5 mg1.5) (5 mg1.5) (5 mg1) (5 mg1.5)

## 2011-10-16 MED ORDER — METOPROLOL TARTRATE 25 MG PO TABS: 25.0000 mg | ORAL_TABLET | Freq: Two times a day (BID) | ORAL | Status: DC

## 2011-10-16 MED ORDER — AMLODIPINE-ATORVASTATIN 10-20 MG PO TABS: 1.0000 | ORAL_TABLET | Freq: Every day | ORAL | Status: DC

## 2011-10-16 MED ORDER — RAMIPRIL 10 MG PO CAPS: 10.0000 mg | ORAL_CAPSULE | Freq: Two times a day (BID) | ORAL | Status: DC

## 2011-10-16 NOTE — Telephone Encounter (Signed)
rx sent to pharmacy

## 2011-10-25 ENCOUNTER — Ambulatory Visit (INDEPENDENT_AMBULATORY_CARE_PROVIDER_SITE_OTHER): Payer: PRIVATE HEALTH INSURANCE | Admitting: Cardiology

## 2011-10-25 ENCOUNTER — Encounter: Payer: Self-pay | Admitting: Cardiology

## 2011-10-25 VITALS — BP 120/74 | HR 63 | Ht 65.5 in | Wt 212.0 lb

## 2011-10-25 DIAGNOSIS — I359 Nonrheumatic aortic valve disorder, unspecified: Secondary | ICD-10-CM

## 2011-10-25 DIAGNOSIS — I428 Other cardiomyopathies: Secondary | ICD-10-CM

## 2011-10-25 DIAGNOSIS — F17200 Nicotine dependence, unspecified, uncomplicated: Secondary | ICD-10-CM

## 2011-10-25 DIAGNOSIS — I351 Nonrheumatic aortic (valve) insufficiency: Secondary | ICD-10-CM

## 2011-10-25 DIAGNOSIS — E782 Mixed hyperlipidemia: Secondary | ICD-10-CM

## 2011-10-25 DIAGNOSIS — I10 Essential (primary) hypertension: Secondary | ICD-10-CM

## 2011-10-25 DIAGNOSIS — Z720 Tobacco use: Secondary | ICD-10-CM

## 2011-10-25 DIAGNOSIS — I82409 Acute embolism and thrombosis of unspecified deep veins of unspecified lower extremity: Secondary | ICD-10-CM

## 2011-10-25 NOTE — Assessment & Plan Note (Signed)
Patient counseled on discontinuing. 

## 2011-10-25 NOTE — Assessment & Plan Note (Signed)
Plan continue present medications. Given worsening dyspnea Will repeat echocardiogram to reassess LV function.

## 2011-10-25 NOTE — Assessment & Plan Note (Signed)
Blood pressure controlled. Continue present medications. 

## 2011-10-25 NOTE — Progress Notes (Signed)
HPI:[name] is a pleasant  female who has a history of nonischemic cardiomyopathy improved by most recent echocardiogram.  She also has aortic insufficiency.  Her most recent echo was performed in Oct 2011.  At that time, she was found to have an ejection fraction of 55%.  There was mild to moderate aortic insufficiency and mild to moderate mitral regurgitation, mild LAE.  She also has had a CT of her chest that showed no thoracic aneurysm.  She has been treated medically.  Note, when I saw her previously, she did have a Holter monitor secondary to "dizzy spells" that showed PACs and PVCs. Also, note that her most recent Myoview was performed on August 07, 2007.  Her ejection fraction was 46% and there was soft tissue attenuation versus prior anterior infarct, but no ischemia.  Previous catheterization in 2004 showed no obstructive coronary disease. I last saw her in Sept 2011. Since then she does describe increased dyspnea on exertion. There is no orthopnea, PND or chest pain. She occasionally has mild pedal edema. Occasional dizziness but no syncope.   Current Outpatient Prescriptions  Medication Sig Dispense Refill  . amlodipine-atorvastatin (CADUET) 10-20 MG per tablet Take 1 tablet by mouth daily.  30 tablet  3  . cholecalciferol (VITAMIN D) 1000 UNITS tablet Take 1,000 Units by mouth daily.        . fexofenadine (ALLEGRA) 60 MG tablet Take 60 mg by mouth daily.        . fluticasone (FLONASE) 50 MCG/ACT nasal spray USE 2 SPRAYS IN EACH NOSTRIL DAILY. PT NEEDS TO SCHEDULE A FOLLOW UP APPT BEFORE NEXT REFILL.  16 g  0  . metoprolol tartrate (LOPRESSOR) 25 MG tablet Take 1 tablet (25 mg total) by mouth 2 (two) times daily.  60 tablet  3  . potassium chloride SA (K-DUR,KLOR-CON) 20 MEQ tablet Take 1 tablet (20 mEq total) by mouth daily.  30 tablet  6  . ramipril (ALTACE) 10 MG capsule Take 1 capsule (10 mg total) by mouth 2 (two) times daily.  60 capsule  3  . warfarin (COUMADIN) 5 MG tablet  TAKE AS DIRECTED  60 tablet  0     Past Medical History  Diagnosis Date  . Other primary cardiomyopathies   . Hypertension   . Aortic valve disorders   . Hyperlipidemia   . Personal history of colonic polyps   . Personal history of venous thrombosis and embolism   . Asthma   . Allergic rhinitis   . Encounter for long-term (current) use of anticoagulants     Past Surgical History  Procedure Date  . Abdominal hysterectomy   . Rt knee arthoscopic   . Dilation and curettage of uterus     several  . Cardiac catheterization     History   Social History  . Marital Status: Legally Separated    Spouse Name: N/A    Number of Children: N/A  . Years of Education: N/A   Occupational History  . Not on file.   Social History Main Topics  . Smoking status: Current Some Day Smoker  . Smokeless tobacco: Not on file  . Alcohol Use: Yes  . Drug Use: No  . Sexually Active: Not on file   Other Topics Concern  . 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 Remaining systems are negative.  Physical Exam: Well-developed well-nourished in no acute distress.  Skin is warm and dry.  HEENT is normal.  Neck is supple. No thyromegaly.  Chest is clear to auscultation with normal expansion.  Cardiovascular exam is regular rate and rhythm.  Abdominal exam nontender or distended. No masses palpated. Extremities show no edema. neuro grossly intact  ECG sinus rhythm with frequent PVCs. Anterior T-wave changes. Left ventricular hypertrophy.

## 2011-10-25 NOTE — Assessment & Plan Note (Signed)
Continue statin. Lipids and liver monitor by primary care.

## 2011-10-25 NOTE — Assessment & Plan Note (Signed)
Repeat echocardiogram for aortic and mitral regurgitation.

## 2011-10-25 NOTE — Patient Instructions (Signed)
Your physician wants you to follow-up in: ONE YEAR  You will receive a reminder letter in the mail two months in advance. If you don't receive a letter, please call our office to schedule the follow-up appointment.   Your physician has requested that you have an echocardiogram. Echocardiography is a painless test that uses sound waves to create images of your heart. It provides your doctor with information about the size and shape of your heart and how well your heart's chambers and valves are working. This procedure takes approximately one hour. There are no restrictions for this procedure.   

## 2011-10-25 NOTE — Assessment & Plan Note (Signed)
Continue Coumadin. Managed by primary care.

## 2011-10-26 ENCOUNTER — Ambulatory Visit (HOSPITAL_COMMUNITY): Payer: PRIVATE HEALTH INSURANCE | Attending: Cardiology | Admitting: Radiology

## 2011-10-26 DIAGNOSIS — I428 Other cardiomyopathies: Secondary | ICD-10-CM | POA: Insufficient documentation

## 2011-10-26 DIAGNOSIS — I10 Essential (primary) hypertension: Secondary | ICD-10-CM | POA: Insufficient documentation

## 2011-10-26 DIAGNOSIS — F17200 Nicotine dependence, unspecified, uncomplicated: Secondary | ICD-10-CM | POA: Insufficient documentation

## 2011-10-26 DIAGNOSIS — R0989 Other specified symptoms and signs involving the circulatory and respiratory systems: Secondary | ICD-10-CM | POA: Insufficient documentation

## 2011-10-26 DIAGNOSIS — Z86718 Personal history of other venous thrombosis and embolism: Secondary | ICD-10-CM | POA: Insufficient documentation

## 2011-10-26 DIAGNOSIS — R0609 Other forms of dyspnea: Secondary | ICD-10-CM | POA: Insufficient documentation

## 2011-10-26 DIAGNOSIS — E785 Hyperlipidemia, unspecified: Secondary | ICD-10-CM | POA: Insufficient documentation

## 2011-10-26 DIAGNOSIS — I359 Nonrheumatic aortic valve disorder, unspecified: Secondary | ICD-10-CM | POA: Insufficient documentation

## 2011-10-26 DIAGNOSIS — I351 Nonrheumatic aortic (valve) insufficiency: Secondary | ICD-10-CM

## 2011-10-27 ENCOUNTER — Ambulatory Visit: Payer: PRIVATE HEALTH INSURANCE | Admitting: *Deleted

## 2011-10-27 DIAGNOSIS — Z5181 Encounter for therapeutic drug level monitoring: Secondary | ICD-10-CM

## 2011-10-27 DIAGNOSIS — I82409 Acute embolism and thrombosis of unspecified deep veins of unspecified lower extremity: Secondary | ICD-10-CM

## 2011-10-27 DIAGNOSIS — Z7901 Long term (current) use of anticoagulants: Secondary | ICD-10-CM

## 2011-10-27 LAB — POCT INR: INR: 3.2

## 2011-10-30 ENCOUNTER — Telehealth: Payer: Self-pay | Admitting: Cardiology

## 2011-10-30 NOTE — Progress Notes (Signed)
Quick Note:  Pt aware of results. ______ 

## 2011-10-30 NOTE — Telephone Encounter (Signed)
Spoke with pt, aware of echo results. Copy mailed to pt at her request.

## 2011-10-30 NOTE — Telephone Encounter (Signed)
FU Call: pt returning call from [place]. Please call back.

## 2011-11-07 ENCOUNTER — Telehealth: Payer: Self-pay | Admitting: *Deleted

## 2011-11-07 MED ORDER — WARFARIN SODIUM 5 MG PO TABS: ORAL_TABLET | ORAL | Status: DC

## 2011-11-07 NOTE — Telephone Encounter (Signed)
Refill on coumadin 5mg 

## 2011-11-10 ENCOUNTER — Ambulatory Visit (INDEPENDENT_AMBULATORY_CARE_PROVIDER_SITE_OTHER): Payer: PRIVATE HEALTH INSURANCE | Admitting: Internal Medicine

## 2011-11-10 DIAGNOSIS — I82409 Acute embolism and thrombosis of unspecified deep veins of unspecified lower extremity: Secondary | ICD-10-CM

## 2011-11-10 LAB — POCT INR: INR: 4.4

## 2011-11-10 NOTE — Patient Instructions (Signed)
  Latest dosing instructions   Total [name] Foster Tue Wed Thu Fri Sat   45 7.5 mg 5 mg 7.5 mg 5 mg 7.5 mg 5 mg 7.5 mg    (5 mg1.5) (5 mg1) (5 mg1.5) (5 mg1) (5 mg1.5) (5 mg1) (5 mg1.5)

## 2011-11-17 ENCOUNTER — Ambulatory Visit: Payer: PRIVATE HEALTH INSURANCE

## 2011-11-17 DIAGNOSIS — Z7901 Long term (current) use of anticoagulants: Secondary | ICD-10-CM

## 2011-11-17 DIAGNOSIS — Z5181 Encounter for therapeutic drug level monitoring: Secondary | ICD-10-CM

## 2011-11-17 DIAGNOSIS — I82409 Acute embolism and thrombosis of unspecified deep veins of unspecified lower extremity: Secondary | ICD-10-CM

## 2011-11-17 NOTE — Patient Instructions (Signed)
  Latest dosing instructions   Total Dorene Grebe Tue Wed Thu Fri Sat   45 7.5 mg 5 mg 7.5 mg 5 mg 7.5 mg 5 mg 7.5 mg    (5 mg1.5) (5 mg1) (5 mg1.5) (5 mg1) (5 mg1.5) (5 mg1) (5 mg1.5)

## 2011-11-27 ENCOUNTER — Ambulatory Visit (INDEPENDENT_AMBULATORY_CARE_PROVIDER_SITE_OTHER): Payer: PRIVATE HEALTH INSURANCE | Admitting: Internal Medicine

## 2011-11-27 DIAGNOSIS — I82409 Acute embolism and thrombosis of unspecified deep veins of unspecified lower extremity: Secondary | ICD-10-CM

## 2011-11-27 LAB — POCT INR: INR: 3.2

## 2011-11-27 NOTE — Patient Instructions (Signed)
  Latest dosing instructions   Total Dorene Grebe Tue Wed Thu Fri Sat   45 7.5 mg 5 mg 7.5 mg 5 mg 7.5 mg 5 mg 7.5 mg    (5 mg1.5) (5 mg1) (5 mg1.5) (5 mg1) (5 mg1.5) (5 mg1) (5 mg1.5)

## 2011-12-07 ENCOUNTER — Ambulatory Visit: Payer: PRIVATE HEALTH INSURANCE

## 2011-12-12 ENCOUNTER — Ambulatory Visit: Payer: PRIVATE HEALTH INSURANCE

## 2012-01-23 ENCOUNTER — Telehealth: Payer: Self-pay | Admitting: *Deleted

## 2012-01-23 MED ORDER — FEXOFENADINE HCL 60 MG PO TABS: 60.0000 mg | ORAL_TABLET | Freq: Every day | ORAL | Status: DC

## 2012-01-23 NOTE — Telephone Encounter (Signed)
Pt needs a new prescription for Allegra called to [facility] ([place])

## 2012-02-10 ENCOUNTER — Ambulatory Visit (INDEPENDENT_AMBULATORY_CARE_PROVIDER_SITE_OTHER): Payer: PRIVATE HEALTH INSURANCE | Admitting: Family Medicine

## 2012-02-10 ENCOUNTER — Ambulatory Visit: Payer: PRIVATE HEALTH INSURANCE

## 2012-02-10 VITALS — BP 128/77 | HR 68 | Temp 97.9°F | Resp 16 | Ht 66.5 in | Wt 212.2 lb

## 2012-02-10 DIAGNOSIS — Z7901 Long term (current) use of anticoagulants: Secondary | ICD-10-CM

## 2012-02-10 DIAGNOSIS — M79609 Pain in unspecified limb: Secondary | ICD-10-CM

## 2012-02-10 DIAGNOSIS — M79673 Pain in unspecified foot: Secondary | ICD-10-CM

## 2012-02-10 LAB — URIC ACID: Uric Acid, Serum: 7 mg/dL (ref 2.4–7.0)

## 2012-02-10 LAB — PROTIME-INR
INR: 3.07 — ABNORMAL HIGH (ref <1.50)
Prothrombin Time: 32.7 seconds — ABNORMAL HIGH (ref 11.6–15.2)

## 2012-02-10 IMAGING — CR DG FOOT COMPLETE 3+V*R*
2 series · 2 of 2 positions shown · non-contrast
Comparison: Preliminary reading Dr. PERCHES

CLINICAL DATA: Pain and swelling

RIGHT FOOT COMPLETE - 3+ VIEW

[AP]
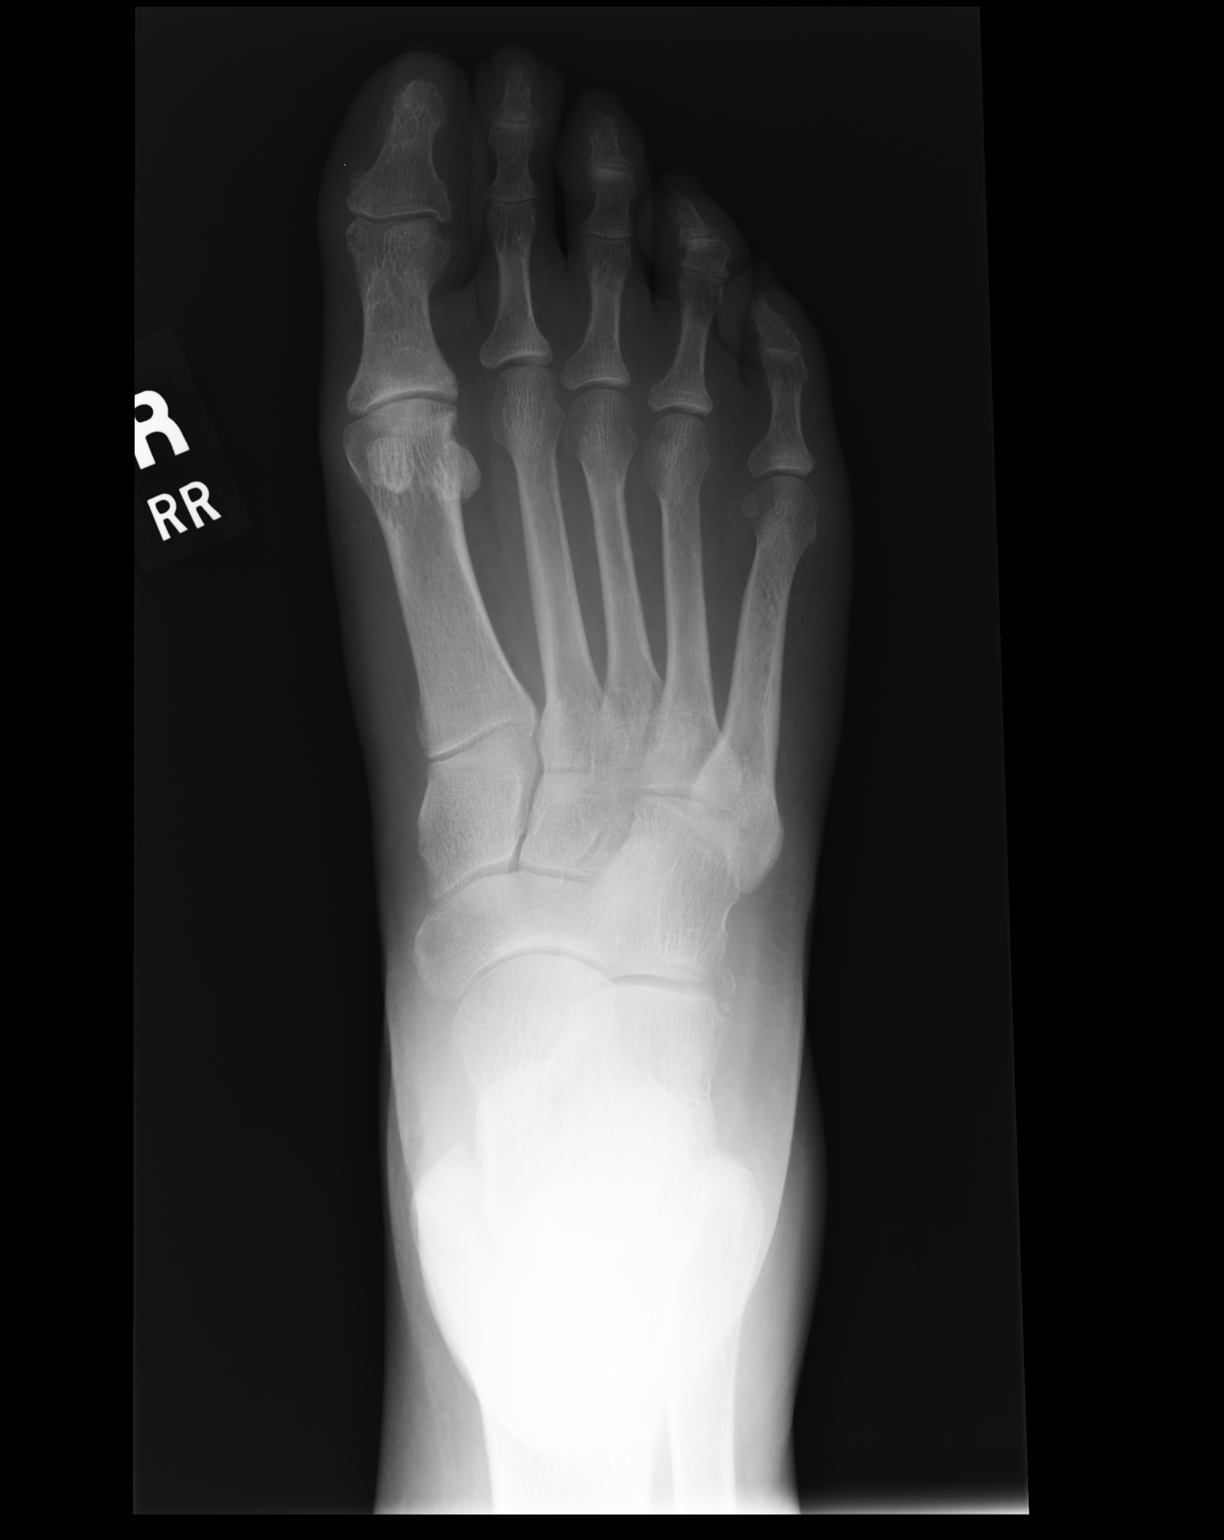

[ap obl int rot]
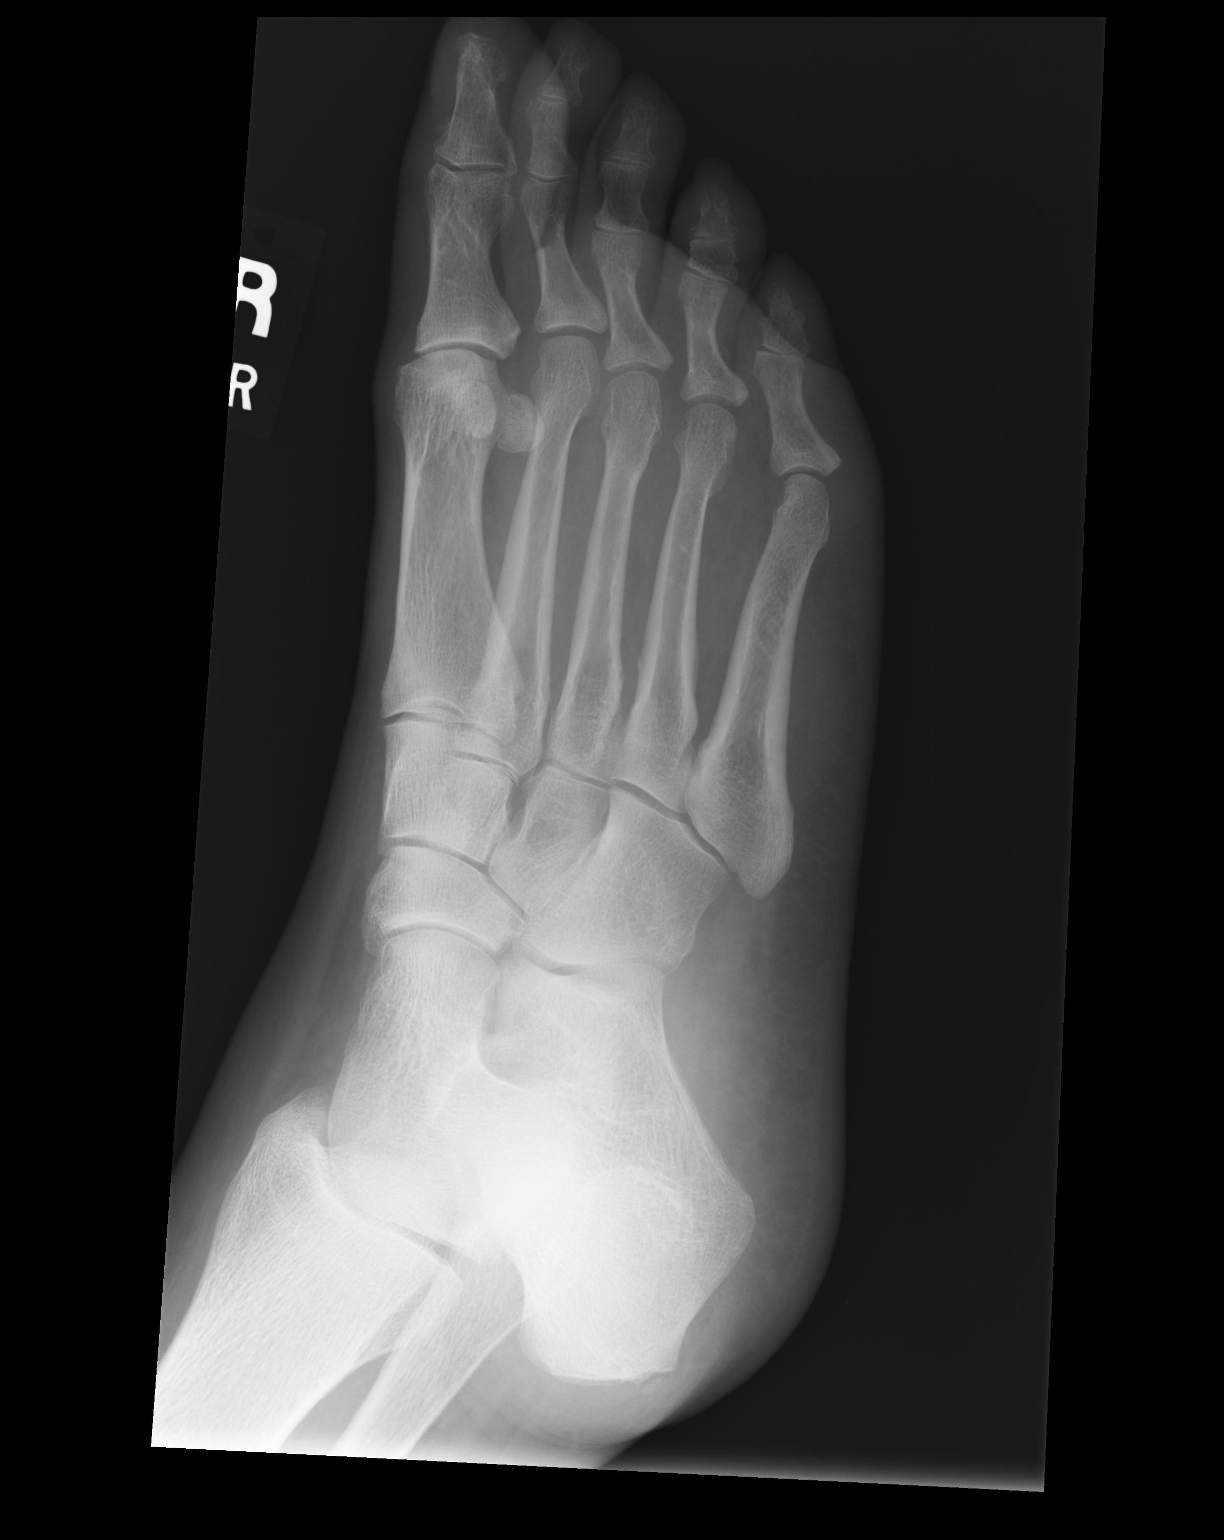

[2 of 2 positions shown; findings below may reference images not displayed]

FINDINGS: Three views of the right foot submitted.  No acute
fracture or subluxation.  No radiopaque foreign body.
IMPRESSION: No acute fracture or subluxation.

Clinically significant discrepancy from primary report, if
provided: None

## 2012-02-10 MED ORDER — TRAMADOL HCL 50 MG PO TABS: 50.0000 mg | ORAL_TABLET | Freq: Three times a day (TID) | ORAL | Status: AC | PRN

## 2012-02-10 NOTE — Progress Notes (Addendum)
Patient Name: [name] Foster Date of Birth: [birth year] Medical Record Number: [id number] Gender: female Date of Encounter: 02/10/2012  History of Present Illness:  [name] Foster is a 61 y.o. very pleasant female patient who presents with the following:  Today is Saturday. Noted foot pain Thursday am, and swelling that night after working all day.  It was better Friday morning, but sympotms returned last night.  Swelling gets worse as the day goes on, and it hurts to walk on it.   No known injury, never had this before.  She has noted a mild P.F.pain in the past, but never anything like this She has no history of gout and has not changed her exercise/ activity routine lately She takes coumadin for a history of DVT  Patient Active Problem List  Diagnoses  . VITAMIN D DEFICIENCY  . HYPERLIPIDEMIA  . HYPERKALEMIA  . HYPOKALEMIA  . HYPERTENSION  . DISEASE, HYPERTENSIVE HEART NOS W/HF  . CARDIOMYOPATHY, PRIMARY NEC  . CHF  . DVT  . SINUSITIS- ACUTE-NOS  . URI  . Allergic Rhinitis, Cause Unspecified  . ASTHMA  . KNEE PAIN, RIGHT  . ANKLE PAIN, LEFT  . SYMPTOM, SYNCOPE AND COLLAPSE  . EDEMA  . HEADACHE  . HEART MURMUR, HX OF  . DVT, HX OF  . DVT (deep venous thrombosis)  . Chronic anticoagulation  . Nocturnal dyspnea  . Tobacco user  . Aortic insufficiency   Past Medical History  Diagnosis Date  . Other primary cardiomyopathies   . Hypertension   . Aortic valve disorders   . Hyperlipidemia   . Personal history of colonic polyps   . Personal history of venous thrombosis and embolism   . Asthma   . Allergic rhinitis   . Encounter for long-term (current) use of anticoagulants    Past Surgical History  Procedure Date  . Abdominal hysterectomy   . Rt knee arthoscopic   . Dilation and curettage of uterus     several  . Cardiac catheterization    History  Substance Use Topics  . Smoking status: Current Some Day Smoker  . Smokeless tobacco: Not on file  . Alcohol  Use: Yes   Family History  Problem Relation Age of Onset  . Alcohol abuse    . Colon cancer    . Depression    . Hyperlipidemia    . Hypertension    . Kidney disease    . Uterine cancer    . Breast cancer      cousin  . Stroke      grandmother   Allergies  Allergen Reactions  . Aspirin   . Penicillins   . Tetanus Toxoid     Medication list has been reviewed and updated.  Review of Systems: As per HPI- otherwise negative.   Physical Examination: Filed Vitals:   02/10/12 1241  BP: 128/77  Pulse: 68  Temp: 97.9 F (36.6 C)  TempSrc: Oral  Resp: 16  Height: 5' 6.5" (1.689 m)  Weight: 212 lb 3.2 oz (96.253 kg)    Body mass index is 33.74 kg/(m^2).   GEN: WDWN, NAD, Non-toxic, Alert & Oriented x 3 HEENT: Atraumatic, Normocephalic.  Ears and Nose: No external deformity. EXTR: No clubbing/cyanosis/edema NEURO: Normal gait.  PSYCH: Normally interactive. Conversant. Not depressed or anxious appearing.  Calm demeanor.  Right foot: mild swelling in the mid foot.  Tender over the 2nd/ 3rd/ 4th MT.  Pain with squeeze test.  No tenderness at plantar fascia or heel. No  redness or heat.  Achilles and heel negative  [facility] reading (PRIMARY) by  Dr. Lorelei Pont.  Negative foot  Clinical Data: Pain and swelling  RIGHT FOOT COMPLETE - 3+ VIEW  Comparison: Preliminary reading Dr. Lorelei Pont  Findings: Three views of the right foot submitted. No acuteright  fracture or subluxation. No radiopaque foreign body.  IMPRESSION: No acute fracture or subluxation.  Clinically significant discrepancy from primary report, if provided: None  Assessment and Plan: 1. Foot pain  DG Foot Complete Right, Uric Acid, traMADol (ULTRAM) 50 MG tablet  2. Chronic anticoagulation  Protime-INR   Suspect that Erionna has gout or a morton's neuroma.  While we await her uric acid level will treat with ultram as needed- avoid NSAIDs due to coumadin use.  Will check PT/INR today.  If she ends up using  more than a few ultram weekly she should have a recheck INR in 2 weeks.   Post- op shoe for comfort.  Elevate and ice foot when possible.   Will follow- up with her pending her lab results- please call us sooner if worse   addnd 02/15/12 Results for orders placed in visit on 02/10/12  URIC ACID      Component Value Range   Uric Acid, Serum 7.0  2.4 - 7.0 (mg/dL)  PROTIME-INR      Component Value Range   Prothrombin Time 32.7 (*) 11.6 - 15.2 (seconds)   INR 3.07 (*) <1.50    I had left a message with her regarding these results.  She did call back today- still has the pain, not much change.  Can she see otho? Called her back: We can certainly refer her to ortho.  However, will try colchicine in the meantime as her uric acid was upper limit of normal.  She feels ok with the plan and will let me know if anything changes.

## 2012-02-14 ENCOUNTER — Telehealth: Payer: Self-pay

## 2012-02-14 NOTE — Telephone Encounter (Signed)
Pt still experiencing pain,swelling  Especially on the ankle side,requesting call back from dr copland as to whether she should have referral to ortho.   Best phone is [phone] or [phone]

## 2012-02-15 MED ORDER — COLCHICINE 0.6 MG PO TABS: ORAL_TABLET | ORAL | Status: DC

## 2012-02-15 NOTE — Progress Notes (Signed)
Addended by: Lamar Blinks C on: 02/15/2012 11:14 AM   Modules accepted: Orders

## 2012-02-18 ENCOUNTER — Other Ambulatory Visit: Payer: Self-pay | Admitting: Internal Medicine

## 2012-03-03 ENCOUNTER — Encounter: Payer: Self-pay | Admitting: Family Medicine

## 2012-09-30 ENCOUNTER — Other Ambulatory Visit: Payer: Self-pay | Admitting: Internal Medicine

## 2012-12-02 ENCOUNTER — Other Ambulatory Visit: Payer: Self-pay | Admitting: Internal Medicine

## 2012-12-06 ENCOUNTER — Other Ambulatory Visit: Payer: Self-pay | Admitting: Internal Medicine

## 2012-12-20 ENCOUNTER — Encounter: Payer: Self-pay | Admitting: Family Medicine

## 2012-12-20 ENCOUNTER — Ambulatory Visit (INDEPENDENT_AMBULATORY_CARE_PROVIDER_SITE_OTHER): Payer: PRIVATE HEALTH INSURANCE | Admitting: Family Medicine

## 2012-12-20 VITALS — BP 138/72 | HR 77 | Temp 99.1°F | Wt 218.0 lb

## 2012-12-20 DIAGNOSIS — J069 Acute upper respiratory infection, unspecified: Secondary | ICD-10-CM

## 2012-12-20 MED ORDER — HYDROCODONE-HOMATROPINE 5-1.5 MG/5ML PO SYRP: 5.0000 mL | ORAL_SOLUTION | Freq: Three times a day (TID) | ORAL | Status: DC | PRN

## 2012-12-20 MED ORDER — ALBUTEROL SULFATE HFA 108 (90 BASE) MCG/ACT IN AERS: 2.0000 | INHALATION_SPRAY | Freq: Four times a day (QID) | RESPIRATORY_TRACT | Status: DC | PRN

## 2012-12-20 NOTE — Progress Notes (Signed)
Chief Complaint  Patient presents with  . Cough    sratchy throat, runny nose, thick yellow nasal drainage     HPI:  Acute visit for upper resp infection: -started: 2 days ago -symptoms:nasal congestion, sore throat, cough, a little SOB at night, sneezing -denies:fever, NVD, tooth pain  -has tried: tylenol sinus -sick contacts: yes, work contacts and grandkids -Hx of: ? Asthma, reports doesn't take any medications for this but has had to use inhalers when sick in the remote past, also hx of allergies - takes allegra sometimes in the spring and flonase which she did start back up   ROS: See pertinent positives and negatives per HPI.  Past Medical History  Diagnosis Date  . Other primary cardiomyopathies   . Hypertension   . Aortic valve disorders   . Hyperlipidemia   . Personal history of colonic polyps   . Personal history of venous thrombosis and embolism   . Asthma   . Allergic rhinitis   . Long term (current) use of anticoagulants     Family History  Problem Relation Age of Onset  . Alcohol abuse    . Colon cancer    . Depression    . Hyperlipidemia    . Hypertension    . Kidney disease    . Uterine cancer    . Breast cancer      cousin  . Stroke      grandmother    History   Social History  . Marital Status: Legally Separated    Spouse Name: N/A    Number of Children: N/A  . Years of Education: N/A   Social History Main Topics  . Smoking status: Current Some Day Smoker  . Smokeless tobacco: None  . Alcohol Use: Yes  . Drug Use: No  . Sexually Active: None   Other Topics Concern  . 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   Regular exercise- no   5 hours sleep    Lives with a son age 3    No pets          Current outpatient prescriptions:amlodipine-atorvastatin (CADUET) 10-20 MG per tablet, TAKE ONE CAPSULE BY MOUTH EVERY DAY, Disp: 30 tablet, Rfl: 2;  cholecalciferol (VITAMIN D) 1000  UNITS tablet, Take 1,000 Units by mouth daily.  , Disp: , Rfl: ;  colchicine 0.6 MG tablet, For gout flare- take 2 pills, than an hour later take one more.  3 pills total per gout flare, Disp: 30 tablet, Rfl: 0 fexofenadine (ALLEGRA) 60 MG tablet, Take 1 tablet (60 mg total) by mouth daily., Disp: 90 tablet, Rfl: 1;  fluticasone (FLONASE) 50 MCG/ACT nasal spray, USE 2 SPRAYS IN EACH NOSTRIL DAILY. PT NEEDS TO SCHEDULE A FOLLOW UP APPT BEFORE NEXT REFILL., Disp: 16 g, Rfl: 0;  furosemide (LASIX) 20 MG tablet, Take 20 mg by mouth 2 (two) times daily., Disp: , Rfl:  metoprolol tartrate (LOPRESSOR) 25 MG tablet, TAKE 1 TABLET BY MOUTH TWICE A DAY, Disp: 60 tablet, Rfl: 2;  ramipril (ALTACE) 10 MG capsule, TAKE ONE CAPSULE BY MOUTH TWICE A DAY, Disp: 60 capsule, Rfl: 2;  warfarin (COUMADIN) 5 MG tablet, TAKE AS DIRECTED, Disp: 60 tablet, Rfl: 0;  albuterol (PROVENTIL HFA;VENTOLIN HFA) 108 (90 BASE) MCG/ACT inhaler, Inhale 2 puffs into the lungs every 6 (six) hours as needed for wheezing., Disp: 1 Inhaler, Rfl: 0 HYDROcodone-homatropine (HYCODAN) 5-1.5 MG/5ML syrup, Take 5 mLs  by mouth every 8 (eight) hours as needed for cough., Disp: 120 mL, Rfl: 0;  potassium chloride SA (K-DUR,KLOR-CON) 20 MEQ tablet, Take 1 tablet (20 mEq total) by mouth daily., Disp: 30 tablet, Rfl: 6  EXAM:  Filed Vitals:   12/20/12 1405  BP: 138/72  Pulse: 77  Temp: 99.1 F (37.3 C)    Body mass index is 34.66 kg/(m^2).  GENERAL: vitals reviewed and listed above, alert, oriented, appears well hydrated and in no acute distress  HEENT: atraumatic, conjunttiva clear, no obvious abnormalities on inspection of external nose and ears, normal appearance of ear canals and TMs, clear nasal congestion, mild post oropharyngeal erythema with PND, no tonsillar edema or exudate, no sinus TTP  NECK: no obvious masses on inspection  LUNGS: clear to auscultation bilaterally, no wheezes, rales or rhonchi, good air movement  CV: HRRR, no  peripheral edema  MS: moves all extremities without noticeable abnormality  PSYCH: pleasant and cooperative, no obvious depression or anxiety  ASSESSMENT AND PLAN:  Discussed the following assessment and plan:  Upper respiratory infection - Plan: albuterol (PROVENTIL HFA;VENTOLIN HFA) 108 (90 BASE) MCG/ACT inhaler, HYDROcodone-homatropine (HYCODAN) 5-1.5 MG/5ML syrup  -likely combo of viral and allergic rhinosinusitis with some bronchial involvement thought lungs sound great today -advise flonase, restart allegra, alb prn and vough med for at night - discussed risks and return precuations -Patient advised to return or notify a doctor immediately if symptoms worsen or persist or new concerns arise.  Patient Instructions  INSTRUCTIONS FOR UPPER RESPIRATORY INFECTION:  -restart flonase and allegra  -albuterol as needed  -plenty of rest and fluids  -nasal saline wash 2-3 times daily (use prepackaged nasal saline or bottled/distilled water if making your own)   -can use sinex nasal spray for drainage and nasal congestion - but do NOT use longer then 3-4 days  -can use tylenol or ibuprofen as directed for aches and sorethroat  -in the winter time, using a humidifier at night is helpful (please follow cleaning instructions)  -if you are taking a cough medication - use only as directed, may also try a teaspoon of honey to coat the throat and throat lozenges  -for sore throat, salt water gargles can help  -follow up if you have fevers, facial pain, tooth pain, difficulty breathing or are worsening or not getting better in 5-7 days      Rosibel Giacobbe R.

## 2012-12-20 NOTE — Patient Instructions (Addendum)
INSTRUCTIONS FOR UPPER RESPIRATORY INFECTION:  -restart flonase and allegra  -albuterol as needed  -plenty of rest and fluids  -nasal saline wash 2-3 times daily (use prepackaged nasal saline or bottled/distilled water if making your own)   -can use sinex nasal spray for drainage and nasal congestion - but do NOT use longer then 3-4 days  -can use tylenol or ibuprofen as directed for aches and sorethroat  -in the winter time, using a humidifier at night is helpful (please follow cleaning instructions)  -if you are taking a cough medication - use only as directed, may also try a teaspoon of honey to coat the throat and throat lozenges  -for sore throat, salt water gargles can help  -follow up if you have fevers, facial pain, tooth pain, difficulty breathing or are worsening or not getting better in 5-7 days

## 2013-01-01 ENCOUNTER — Ambulatory Visit (INDEPENDENT_AMBULATORY_CARE_PROVIDER_SITE_OTHER): Payer: PRIVATE HEALTH INSURANCE | Admitting: Physician Assistant

## 2013-01-01 ENCOUNTER — Encounter: Payer: Self-pay | Admitting: Physician Assistant

## 2013-01-01 VITALS — BP 148/83 | HR 64 | Ht 66.5 in | Wt 218.0 lb

## 2013-01-01 DIAGNOSIS — R079 Chest pain, unspecified: Secondary | ICD-10-CM

## 2013-01-01 DIAGNOSIS — E782 Mixed hyperlipidemia: Secondary | ICD-10-CM

## 2013-01-01 DIAGNOSIS — I10 Essential (primary) hypertension: Secondary | ICD-10-CM

## 2013-01-01 DIAGNOSIS — Z8679 Personal history of other diseases of the circulatory system: Secondary | ICD-10-CM

## 2013-01-01 NOTE — Assessment & Plan Note (Signed)
Mild MR on last 2-D echo one year ago

## 2013-01-01 NOTE — Patient Instructions (Addendum)
Your physician has requested that you have en exercise stress myoview. For further information please visit [email or web address]. Please follow instruction sheet, as given.  Your physician recommends that you return for lab work in: same day as follow up with Dr. Stanford Breed   Your physician recommends that you schedule a follow-up appointment in: you will follow up with Dr. Stanford Breed after test.

## 2013-01-01 NOTE — Assessment & Plan Note (Signed)
Due for a fasting lipid panel. We will order

## 2013-01-01 NOTE — Progress Notes (Signed)
HPI:  This is a 61-year-old female patient of Dr. Stanford Breed who has a history of nonischemic cardiomyopathy ejection fraction 35% on catheter in 2004 with normal coronary arteries. Stress Myoview August 07, 2007 ejection fraction was 46 with soft tissue attenuation versus anterior infarct but no ischemia. Most recent 2-D echo on 10/26/11 showed normal ejection fraction of 55-60%. She had mild mitral regurgitation but no evidence of aortic insufficiency is seen in the past. She also has history of hypertension, hyperlipidemia and tobacco use. She was last seen by Dr. Stanford Breed 10/2011.  She comes in today because of recent chest pain. She complains of several week history of chest heaviness that was worse with exertion associated with dyspnea on exertion. If she sat down it would dissipate in a few minutes. She had some residual discomfort in her chest though. She also had episodes that occurred at rest but will last a few minutes. During this time she has had a terrible upper respiratory infection that she still has a lingering cough with. A lot of the time she had a chest heaviness she was coughing. She is felt that it may be associated. Cardiac risk factors include hypertension, hyperlipidemia, and ongoing cigarette abuse. She works as an Corporate treasurer at a [employer] and she smokes 2-3 cigarettes daily when she takes her breaks.   Allergies:  -- Aspirin   -- Penicillins   -- Tetanus Toxoid   Current Outpatient Prescriptions on File Prior to Visit: albuterol (PROVENTIL HFA;VENTOLIN HFA) 108 (90 BASE) MCG/ACT inhaler, Inhale 2 puffs into the lungs every 6 (six) hours as needed for wheezing., Disp: 1 Inhaler, Rfl: 0 amlodipine-atorvastatin (CADUET) 10-20 MG per tablet, TAKE ONE CAPSULE BY MOUTH EVERY DAY, Disp: 30 tablet, Rfl: 2 cholecalciferol (VITAMIN D) 1000 UNITS tablet, Take 1,000 Units by mouth daily.  , Disp: , Rfl:  colchicine 0.6 MG tablet, For gout flare- take 2 pills, than an hour later take one  more.  3 pills total per gout flare, Disp: 30 tablet, Rfl: 0 fexofenadine (ALLEGRA) 60 MG tablet, Take 1 tablet (60 mg total) by mouth daily., Disp: 90 tablet, Rfl: 1 fluticasone (FLONASE) 50 MCG/ACT nasal spray, USE 2 SPRAYS IN EACH NOSTRIL DAILY. PT NEEDS TO SCHEDULE A FOLLOW UP APPT BEFORE NEXT REFILL., Disp: 16 g, Rfl: 0 furosemide (LASIX) 20 MG tablet, Take 20 mg by mouth 2 (two) times daily., Disp: , Rfl:  HYDROcodone-homatropine (HYCODAN) 5-1.5 MG/5ML syrup, Take 5 mLs by mouth every 8 (eight) hours as needed for cough., Disp: 120 mL, Rfl: 0 metoprolol tartrate (LOPRESSOR) 25 MG tablet, TAKE 1 TABLET BY MOUTH TWICE A DAY, Disp: 60 tablet, Rfl: 2 potassium chloride SA (K-DUR,KLOR-CON) 20 MEQ tablet, Take 1 tablet (20 mEq total) by mouth daily., Disp: 30 tablet, Rfl: 6 ramipril (ALTACE) 10 MG capsule, TAKE ONE CAPSULE BY MOUTH TWICE A DAY, Disp: 60 capsule, Rfl: 2 warfarin (COUMADIN) 5 MG tablet, TAKE AS DIRECTED, Disp: 60 tablet, Rfl: 0  No current facility-administered medications on file prior to visit.   Past Medical History:   Other primary cardiomyopathies                               Hypertension  Aortic valve disorders                                       Hyperlipidemia                                               Personal history of colonic polyps                           Personal history of venous thrombosis and embo*              Asthma                                                       Allergic rhinitis                                            Long term (current) use of anticoagulants                   Past Surgical History:   ABDOMINAL HYSTERECTOMY                                        Rt knee arthoscopic                                           DILATION AND CURETTAGE OF UTERUS                                Comment:several   CARDIAC CATHETERIZATION                                      Review of patient's  family history indicates:   Alcohol abuse                                           Colon cancer                                            Depression                                              Hyperlipidemia  Hypertension                                            Kidney disease                                          Uterine cancer                                          Breast cancer                                             Comment: cousin   Stroke                                                    Comment: grandmother   Social History   Marital Status: Legally Separated   Spouse Name:                      Years of Education:                 Number of children:             Occupational History   None on file  Social History Main Topics   Smoking Status: Current Some Day Smoker         Packs/Day: 0.00  Years:         Smokeless Status: Not on file                      Alcohol Use: Yes            Drug Use: No             Sexual Activity: Not on file        Other Topics            Concern   None on file  Social History Narrative   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    Divorced   Regular exercise- no   5 hours sleep    Lives with a son age 85    No pets      ROS:see history of present illness   PHYSICAL EXAM: Well-nournished, in no acute distress. Neck: No JVD, HJR, Bruit, or thyroid enlargement  Lungs: No tachypnea, clear without wheezing, rales, or rhonchi  Cardiovascular: RRR, PMI not [id number] systolic murmur at the left border and apex, no gallops, bruit, thrill, or heave.  Abdomen: BS normal. Soft without organomegaly, masses, lesions or tenderness.  Extremities: without cyanosis, clubbing or edema. Good distal pulses bilateral  SKin: Warm, no lesions or rashes   Musculoskeletal: No deformities  Neuro: no focal signs  BP 148/83  Pulse 64  Ht 5' 6.5" (1.689 m)  Wt 218 lb (98.884 kg)  BMI  34.66 kg/m2   [id number] bradycardia at 54 beats per minute with LVH  and ST-T wave changes anterolaterally, unchanged from prior tracing

## 2013-01-01 NOTE — Assessment & Plan Note (Signed)
Patient complains of several week history of chest pain in a setting of upper respiratory infection. She has history of normal cardiac catheter in 2004 and no ischemia on Myoview in 2008. She does have multiple cardiac risk factors and has not had chest pain in the past. For this reason I will order a stress Myoview to rule out ischemia.

## 2013-01-01 NOTE — Addendum Note (Signed)
Addended by: Dennie Fetters on: 01/01/2013 10:31 AM   Modules accepted: Orders

## 2013-01-01 NOTE — Assessment & Plan Note (Signed)
Blood pressure stable ? ?

## 2013-01-02 ENCOUNTER — Other Ambulatory Visit: Payer: Self-pay | Admitting: Internal Medicine

## 2013-01-03 ENCOUNTER — Telehealth: Payer: Self-pay | Admitting: Family Medicine

## 2013-01-03 NOTE — Telephone Encounter (Signed)
4-4 Incorrect mobile no. Unable to leave message on home#

## 2013-01-03 NOTE — Telephone Encounter (Signed)
This patient needs to come in for a med check.  Has not been seen since earlier last year.  I have not contacted the patient but filled her meds for 30 days.  Please make appt.  Thanks!!

## 2013-01-08 ENCOUNTER — Ambulatory Visit (HOSPITAL_COMMUNITY): Payer: PRIVATE HEALTH INSURANCE | Attending: Physician Assistant | Admitting: Radiology

## 2013-01-08 VITALS — BP 106/78 | HR 59 | Ht 66.5 in | Wt 220.0 lb

## 2013-01-08 DIAGNOSIS — R0789 Other chest pain: Secondary | ICD-10-CM | POA: Insufficient documentation

## 2013-01-08 DIAGNOSIS — R0989 Other specified symptoms and signs involving the circulatory and respiratory systems: Secondary | ICD-10-CM | POA: Insufficient documentation

## 2013-01-08 DIAGNOSIS — R0609 Other forms of dyspnea: Secondary | ICD-10-CM | POA: Insufficient documentation

## 2013-01-08 DIAGNOSIS — R079 Chest pain, unspecified: Secondary | ICD-10-CM

## 2013-01-08 DIAGNOSIS — R5381 Other malaise: Secondary | ICD-10-CM | POA: Insufficient documentation

## 2013-01-08 DIAGNOSIS — R0602 Shortness of breath: Secondary | ICD-10-CM

## 2013-01-08 DIAGNOSIS — R42 Dizziness and giddiness: Secondary | ICD-10-CM | POA: Insufficient documentation

## 2013-01-08 MED ORDER — TECHNETIUM TC 99M SESTAMIBI GENERIC - CARDIOLITE
33.0000 | Freq: Once | INTRAVENOUS | Status: AC | PRN
  Administered 2013-01-08: 33 via INTRAVENOUS

## 2013-01-08 MED ORDER — TECHNETIUM TC 99M SESTAMIBI GENERIC - CARDIOLITE
11.0000 | Freq: Once | INTRAVENOUS | Status: AC | PRN
  Administered 2013-01-08: 11 via INTRAVENOUS

## 2013-01-08 NOTE — Progress Notes (Signed)
[facility] [address] [place],  [address] [phone]    Cardiology Nuclear Med Study  [name] Foster is a 62 y.o. female     MRN : [id number]     DOB: 03-09-1951  Procedure Date: 01/08/2013  Nuclear Med Background Indication for Stress Test:  Evaluation for Ischemia History:  '04 Cath:normal coronaries, EF=35%; '08 MPS:no ischemia, EF=46%; 1/13 Echo:EF=60%, mild MR; h/o NICM Cardiac Risk Factors: Hypertension, IDDM Type 2, Lipids, Obesity and Smoker  Symptoms:  Chest Pressure with and without Exertion (last episode of chest discomfort was yesterday), Diaphoresis, Dizziness, DOE and Fatigue   Nuclear Pre-Procedure Caffeine/Decaff Intake:  None NPO After: 8:00pm   Lungs:  Clear. O2 Sat: 100% on room air. IV 0.9% NS with Angio Cath:  22g  IV Site: R Hand  IV Started by:  Crissie Figures, RN  Chest Size (in):  40 Cup Size: DD  Height: 5' 6.5" (1.689 m)  Weight:  220 lb (99.791 kg)  BMI:  Body mass index is 34.98 kg/(m^2). Tech Comments:  Patient held Metoprolol x 24 hrs    Nuclear Med Study 1 or 2 day study: 1 day  Stress Test Type:  Stress  Reading MD: Mertie Moores, MD  Order Authorizing Provider:  Kirk Ruths, MD  Resting Radionuclide: Technetium 82m Sestamibi  Resting Radionuclide Dose: 11.0 mCi   Stress Radionuclide:  Technetium 40m Sestamibi  Stress Radionuclide Dose: 33.0 mCi           Stress Protocol Rest HR: 59 Stress HR: 157  Rest BP: 106/78 Stress BP: 192/77  Exercise Time (min): 5:01 METS: 5.7   Predicted Max HR: 160 bpm % Max HR: 98.12 bpm Rate Pressure Product: 30144   Dose of Adenosine (mg):  n/a Dose of Lexiscan: n/a mg  Dose of Atropine (mg): n/a Dose of Dobutamine: n/a mcg/kg/min (at max HR)  Stress Test Technologist: Letta Moynahan, CMA-N  Nuclear Technologist:  Charlton Amor, CNMT     Rest Procedure:  Myocardial perfusion imaging was performed at rest 45 minutes following the intravenous administration of  Technetium 2m Sestamibi.  Rest ECG: NSR with non-specific ST-T wave changes  Stress Procedure:  The patient exercised on the treadmill utilizing the Bruce Protocol for 5:01 minutes. The patient stopped due to fatigue and denied any chest pain.  Technetium 39m Sestamibi was injected at peak exercise and myocardial perfusion imaging was performed after a brief delay.  Stress ECG: No significant change from baseline ECG  QPS Raw Data Images:  Normal; no motion artifact; normal heart/lung ratio. Stress Images:  Normal homogeneous uptake in all areas of the myocardium. Rest Images:  Normal homogeneous uptake in all areas of the myocardium. Subtraction (SDS):  No evidence of ischemia. Transient Ischemic Dilatation (Normal <1.22):  0.95 Lung/Heart Ratio (Normal <0.45):  0.28  Quantitative Gated Spect Images QGS EDV:  104 ml QGS ESV:  39 ml  Impression Exercise Capacity:  Fair exercise capacity. BP Response:  Hypertensive blood pressure response. Clinical Symptoms:  No significant symptoms noted. ECG Impression:  No significant ST segment change suggestive of ischemia. Comparison with Prior Nuclear Study: No images to compare  Overall Impression:  Normal stress nuclear study.  LV Ejection Fraction: 62%.  LV Wall Motion:  NL LV Function; NL Wall Motion.   Thayer Headings, Brooke Bonito., MD, [facility] 01/08/2013, 5:40 PM Office - [phone] Pager [phone]

## 2013-01-31 ENCOUNTER — Other Ambulatory Visit (INDEPENDENT_AMBULATORY_CARE_PROVIDER_SITE_OTHER): Payer: PRIVATE HEALTH INSURANCE

## 2013-01-31 ENCOUNTER — Ambulatory Visit (INDEPENDENT_AMBULATORY_CARE_PROVIDER_SITE_OTHER): Payer: PRIVATE HEALTH INSURANCE | Admitting: Cardiology

## 2013-01-31 ENCOUNTER — Encounter: Payer: Self-pay | Admitting: Cardiology

## 2013-01-31 VITALS — BP 141/82 | HR 60 | Wt 222.0 lb

## 2013-01-31 DIAGNOSIS — R079 Chest pain, unspecified: Secondary | ICD-10-CM

## 2013-01-31 DIAGNOSIS — R0600 Dyspnea, unspecified: Secondary | ICD-10-CM

## 2013-01-31 DIAGNOSIS — R0609 Other forms of dyspnea: Secondary | ICD-10-CM

## 2013-01-31 DIAGNOSIS — Z720 Tobacco use: Secondary | ICD-10-CM

## 2013-01-31 DIAGNOSIS — E782 Mixed hyperlipidemia: Secondary | ICD-10-CM

## 2013-01-31 DIAGNOSIS — I428 Other cardiomyopathies: Secondary | ICD-10-CM

## 2013-01-31 DIAGNOSIS — R0989 Other specified symptoms and signs involving the circulatory and respiratory systems: Secondary | ICD-10-CM

## 2013-01-31 DIAGNOSIS — F17200 Nicotine dependence, unspecified, uncomplicated: Secondary | ICD-10-CM

## 2013-01-31 DIAGNOSIS — I10 Essential (primary) hypertension: Secondary | ICD-10-CM

## 2013-01-31 DIAGNOSIS — I429 Cardiomyopathy, unspecified: Secondary | ICD-10-CM

## 2013-01-31 LAB — BRAIN NATRIURETIC PEPTIDE: Pro B Natriuretic peptide (BNP): 47 pg/mL (ref 0.0–100.0)

## 2013-01-31 LAB — CBC WITH DIFFERENTIAL/PLATELET
Basophils Absolute: 0 10*3/uL (ref 0.0–0.1)
Eosinophils Absolute: 0.1 10*3/uL (ref 0.0–0.7)
Hemoglobin: 13.4 g/dL (ref 12.0–15.0)
Lymphocytes Relative: 35.9 % (ref 12.0–46.0)
MCHC: 32.9 g/dL (ref 30.0–36.0)
Monocytes Relative: 5.3 % (ref 3.0–12.0)
Neutro Abs: 5 10*3/uL (ref 1.4–7.7)
Neutrophils Relative %: 57.7 % (ref 43.0–77.0)
RBC: 4.62 Mil/uL (ref 3.87–5.11)
RDW: 13.9 % (ref 11.5–14.6)

## 2013-01-31 NOTE — Assessment & Plan Note (Signed)
Patient is complaining of dyspnea and edema. Repeat echocardiogram to assess LV function and aortic insufficiency. Check potassium, renal function and BNP.

## 2013-01-31 NOTE — Assessment & Plan Note (Signed)
Blood pressure controlled. Continue present medications. 

## 2013-01-31 NOTE — Patient Instructions (Addendum)
Your physician recommends that you schedule a follow-up appointment in: [place] has requested that you have an echocardiogram. Echocardiography is a painless test that uses sound waves to create images of your heart. It provides your doctor with information about the size and shape of your heart and how well your heart's chambers and valves are working. This procedure takes approximately one hour. There are no restrictions for this procedure.   Your physician recommends that you HAVE LAB WORK TODAY

## 2013-01-31 NOTE — Assessment & Plan Note (Signed)
Patient counseled on discontinuing. 

## 2013-01-31 NOTE — Progress Notes (Signed)
HPI: [name] is a pleasant female who has a history of nonischemic cardiomyopathy improved by most recent echocardiogram. She also has a history of aortic insufficiency. Her most recent echo was performed in January 2013. Ejection fraction was normal. There was mild mitral regurgitation and mild left atrial enlargement.She also has had a CT of her chest that showed no thoracic aneurysm. She has been treated medically. Previos Holter monitor secondary to "dizzy spells" showed PACs and PVCs. Previous catheterization in 2004 showed no obstructive coronary disease. Seen recently for chest pain. Nuclear study April 2014 showed an ejection fraction of 62% and normal perfusion. Since she was last seen, she describes some dyspnea on exertion. There is no orthopnea or PND but there is occasional mild pedal edema. No further chest pain. No syncope.  Current Outpatient Prescriptions  Medication Sig Dispense Refill  . albuterol (PROVENTIL HFA;VENTOLIN HFA) 108 (90 BASE) MCG/ACT inhaler Inhale 2 puffs into the lungs every 6 (six) hours as needed for wheezing.  1 Inhaler  0  . amlodipine-atorvastatin (CADUET) 10-20 MG per tablet TAKE ONE CAPSULE BY MOUTH EVERY DAY  30 tablet  0  . cholecalciferol (VITAMIN D) 1000 UNITS tablet Take 1,000 Units by mouth daily.        . fexofenadine (ALLEGRA) 60 MG tablet Take 1 tablet (60 mg total) by mouth daily.  90 tablet  1  . fluticasone (FLONASE) 50 MCG/ACT nasal spray USE 2 SPRAYS IN EACH NOSTRIL DAILY. PT NEEDS TO SCHEDULE A FOLLOW UP APPT BEFORE NEXT REFILL.  16 g  0  . furosemide (LASIX) 20 MG tablet Take 20 mg by mouth 2 (two) times daily.      Marland Kitchen HYDROcodone-homatropine (HYCODAN) 5-1.5 MG/5ML syrup Take 5 mLs by mouth every 8 (eight) hours as needed for cough.  120 mL  0  . metoprolol tartrate (LOPRESSOR) 25 MG tablet TAKE 1 TABLET BY MOUTH TWICE A DAY  60 tablet  0  . potassium chloride SA (K-DUR,KLOR-CON) 20 MEQ tablet Take 1 tablet (20 mEq total) by mouth daily.   30 tablet  6  . ramipril (ALTACE) 10 MG capsule TAKE ONE CAPSULE BY MOUTH TWICE A DAY  60 capsule  0  . warfarin (COUMADIN) 5 MG tablet TAKE AS DIRECTED  60 tablet  0   No current facility-administered medications for this visit.     Past Medical History  Diagnosis Date  . Other primary cardiomyopathies   . Hypertension   . Aortic valve disorders   . Hyperlipidemia   . Personal history of colonic polyps   . Personal history of venous thrombosis and embolism   . Asthma   . Allergic rhinitis   . Long term (current) use of anticoagulants     Past Surgical History  Procedure Laterality Date  . Abdominal hysterectomy    . Rt knee arthoscopic    . Dilation and curettage of uterus      several  . Cardiac catheterization      History   Social History  . Marital Status: Legally Separated    Spouse Name: N/A    Number of Children: N/A  . Years of Education: N/A   Occupational History  . Not on file.   Social History Main Topics  . Smoking status: Current Some Day Smoker  . Smokeless tobacco: Not on file  . Alcohol Use: Yes  . Drug Use: No  . Sexually Active: Not on file   Other Topics Concern  . 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 Remaining systems are negative.  Physical Exam: Well-developed well-nourished in no acute distress.  Skin is warm and dry.  HEENT is normal.  Neck is supple.  Chest is clear to auscultation with normal expansion.  Cardiovascular exam is regular rate and rhythm. 2/6 systolic murmur left sternal border. Abdominal exam nontender or distended. No masses palpated. Extremities show trace edema. neuro  grossly intact  ECG 01/01/2013-sinus bradycardia, left ventricular hypertrophy, anterior and inferior T-wave inversion.

## 2013-01-31 NOTE — Assessment & Plan Note (Signed)
Improved on most recent echocardiogram. Continue ACE inhibitor and beta blocker.

## 2013-01-31 NOTE — Assessment & Plan Note (Signed)
No further symptoms. Myoview negative.

## 2013-01-31 NOTE — Assessment & Plan Note (Signed)
Patient remains on Coumadin for history of recurrent DVTs. Management per primary care.

## 2013-01-31 NOTE — Assessment & Plan Note (Signed)
Continue statin. Management per primary care.

## 2013-02-03 ENCOUNTER — Ambulatory Visit (HOSPITAL_COMMUNITY): Payer: PRIVATE HEALTH INSURANCE | Attending: Cardiology

## 2013-02-03 DIAGNOSIS — R609 Edema, unspecified: Secondary | ICD-10-CM | POA: Insufficient documentation

## 2013-02-03 DIAGNOSIS — I428 Other cardiomyopathies: Secondary | ICD-10-CM

## 2013-02-03 DIAGNOSIS — I10 Essential (primary) hypertension: Secondary | ICD-10-CM | POA: Insufficient documentation

## 2013-02-03 DIAGNOSIS — R0600 Dyspnea, unspecified: Secondary | ICD-10-CM

## 2013-02-03 DIAGNOSIS — I429 Cardiomyopathy, unspecified: Secondary | ICD-10-CM

## 2013-02-03 DIAGNOSIS — R0609 Other forms of dyspnea: Secondary | ICD-10-CM | POA: Insufficient documentation

## 2013-02-03 DIAGNOSIS — I359 Nonrheumatic aortic valve disorder, unspecified: Secondary | ICD-10-CM

## 2013-02-03 DIAGNOSIS — E785 Hyperlipidemia, unspecified: Secondary | ICD-10-CM | POA: Insufficient documentation

## 2013-02-03 DIAGNOSIS — R0989 Other specified symptoms and signs involving the circulatory and respiratory systems: Secondary | ICD-10-CM | POA: Insufficient documentation

## 2013-02-03 DIAGNOSIS — F17200 Nicotine dependence, unspecified, uncomplicated: Secondary | ICD-10-CM | POA: Insufficient documentation

## 2013-02-03 NOTE — Progress Notes (Signed)
Echocardiogram performed.  

## 2013-02-04 ENCOUNTER — Other Ambulatory Visit: Payer: Self-pay | Admitting: Internal Medicine

## 2013-02-04 LAB — HEPATIC FUNCTION PANEL
ALT: 21 U/L (ref 0–35)
AST: 26 U/L (ref 0–37)
Albumin: 4 g/dL (ref 3.5–5.2)
Total Bilirubin: 0.3 mg/dL (ref 0.3–1.2)
Total Protein: 7.1 g/dL (ref 6.0–8.3)

## 2013-02-04 LAB — BASIC METABOLIC PANEL
BUN: 12 mg/dL (ref 6–23)
Calcium: 8.8 mg/dL (ref 8.4–10.5)
GFR: 66.4 mL/min (ref >60.00)
Glucose, Bld: 103 mg/dL — ABNORMAL HIGH (ref 70–99)
Potassium: 3.9 mEq/L (ref 3.5–5.1)
Sodium: 144 mEq/L (ref 135–145)

## 2013-02-04 LAB — LIPID PANEL
HDL: 47.4 mg/dL (ref >39.00)
Triglycerides: 92 mg/dL (ref 0.0–149.0)
VLDL: 18.4 mg/dL (ref 0.0–40.0)

## 2013-02-05 ENCOUNTER — Other Ambulatory Visit: Payer: Self-pay

## 2013-02-05 ENCOUNTER — Other Ambulatory Visit: Payer: Self-pay | Admitting: Internal Medicine

## 2013-02-05 DIAGNOSIS — Z1231 Encounter for screening mammogram for malignant neoplasm of breast: Secondary | ICD-10-CM

## 2013-02-05 NOTE — Telephone Encounter (Signed)
Not seen since 11/2011.  Has recently seen Kirk Ruths and had labs done.  I will get the patient scheduled to come in.  How much do you wish to give?

## 2013-02-05 NOTE — Telephone Encounter (Signed)
Since she just saw cardiology can refill x 1 month   ROV before runs out .  ( unless dr Stanford Breed is taking over management of medications which i dont  Think he is)

## 2013-02-06 ENCOUNTER — Other Ambulatory Visit: Payer: Self-pay | Admitting: Internal Medicine

## 2013-02-06 ENCOUNTER — Telehealth: Payer: Self-pay | Admitting: Family Medicine

## 2013-02-06 NOTE — Telephone Encounter (Signed)
No answer on home phone. Mobile number is not hers. Person at the mobile number says she gets call for her all the time, and he wishes it would stop! I removed the number.

## 2013-02-06 NOTE — Telephone Encounter (Signed)
I have not spoke to the pt.  She has not been seen in over 1 year.  She will need to come in per [facility].  Please make an appt with the pt.  I have refilled her medications for 30 days.  Thanks!!

## 2013-02-07 ENCOUNTER — Telehealth: Payer: Self-pay | Admitting: Family Medicine

## 2013-02-07 MED ORDER — METOPROLOL TARTRATE 25 MG PO TABS: ORAL_TABLET | ORAL | Status: DC

## 2013-02-07 MED ORDER — RAMIPRIL 10 MG PO CAPS: ORAL_CAPSULE | ORAL | Status: DC

## 2013-02-07 NOTE — Telephone Encounter (Signed)
The pharmacy contacted me to get refills on warfarin, ramipril and metoprolol.  She has not been seen since early 2013 by Dr. Shanon Ace.  Dr. Regis Bill did authorize refills for 1 month on all 3 medications.  We have not been able to contact her.  Dr. Regis Bill wanted you to be aware of this situation.  Thanks!!

## 2013-02-10 ENCOUNTER — Ambulatory Visit
Admission: RE | Admit: 2013-02-10 | Discharge: 2013-02-10 | Disposition: A | Discharge status: Home / Self Care | Payer: PRIVATE HEALTH INSURANCE | Source: Ambulatory Visit

## 2013-02-10 DIAGNOSIS — Z1231 Encounter for screening mammogram for malignant neoplasm of breast: Secondary | ICD-10-CM

## 2013-02-10 IMAGING — MG MM DIGITAL SCREENING BILAT
4 series · 4 of 4 positions shown · non-contrast
Comparison: Previous exams.

CLINICAL DATA: Screening.

DIGITAL BILATERAL SCREENING MAMMOGRAM WITH CAD

[R CC]
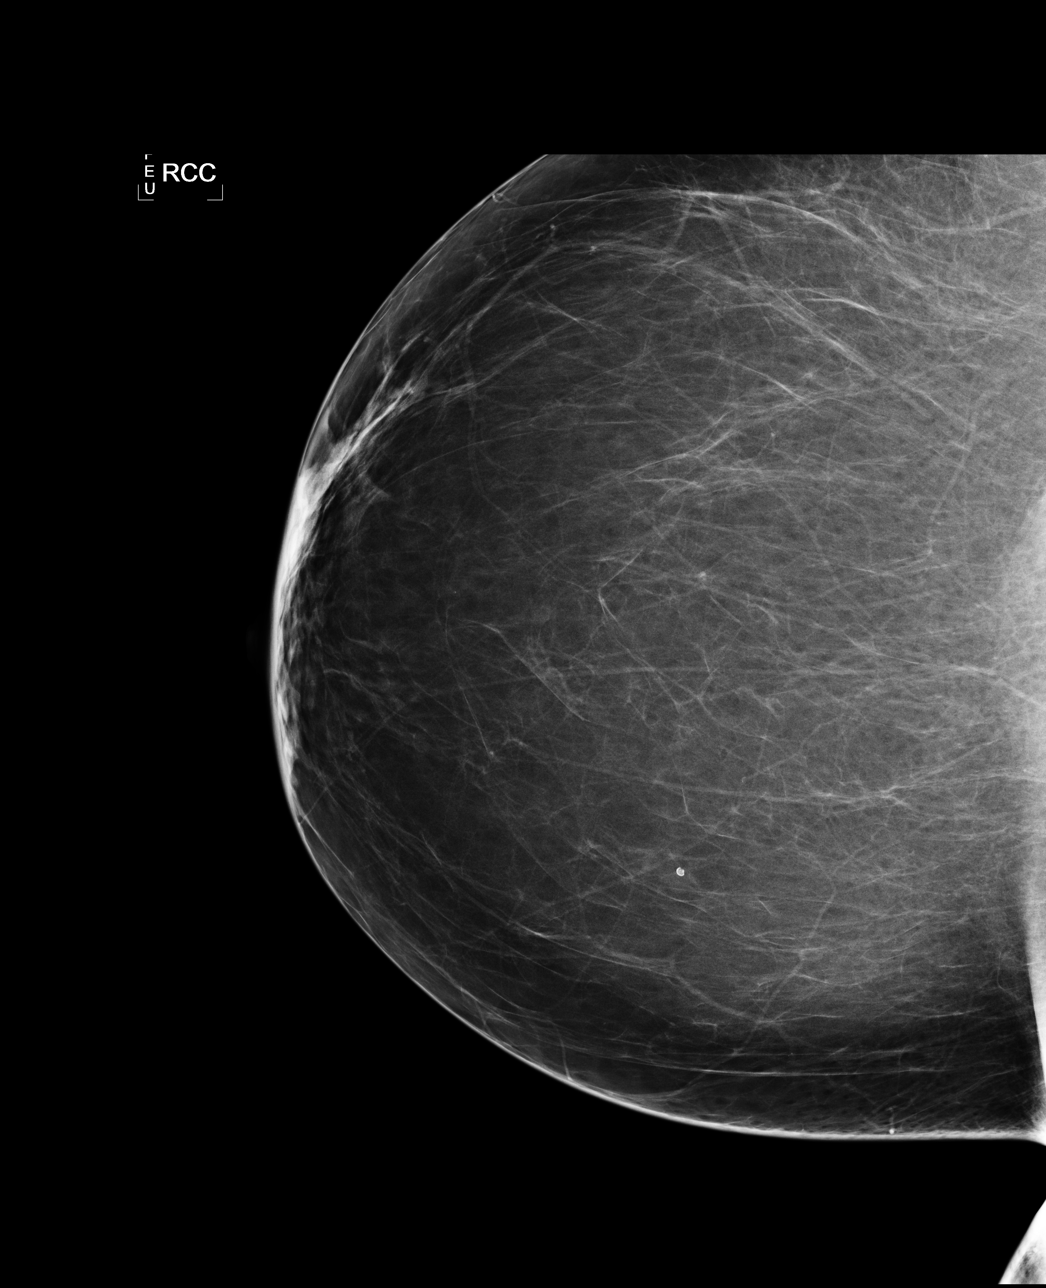

[L CC]
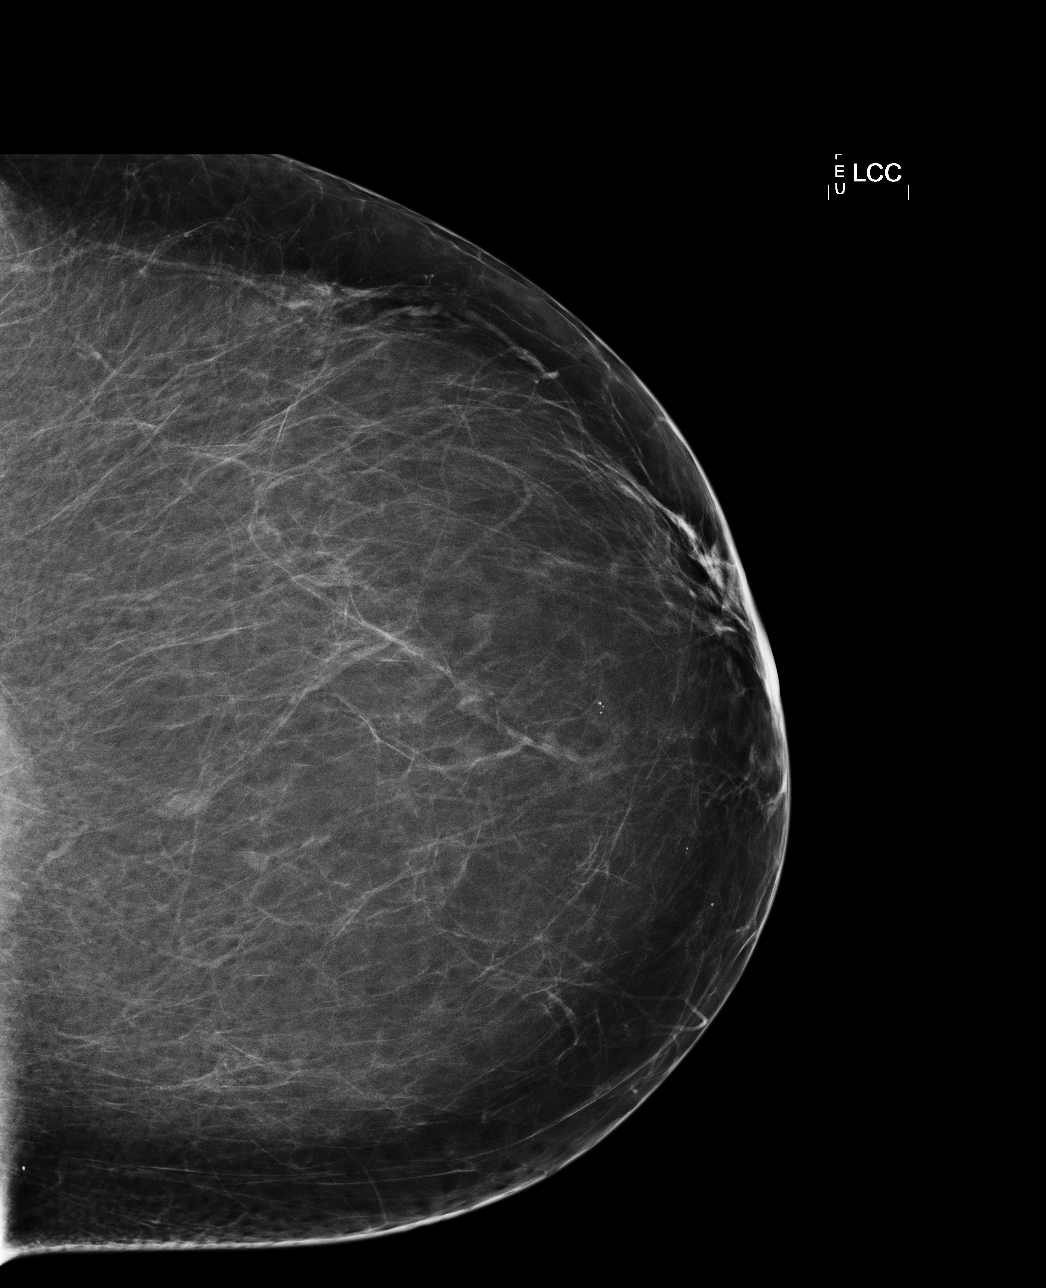

[L MLO]
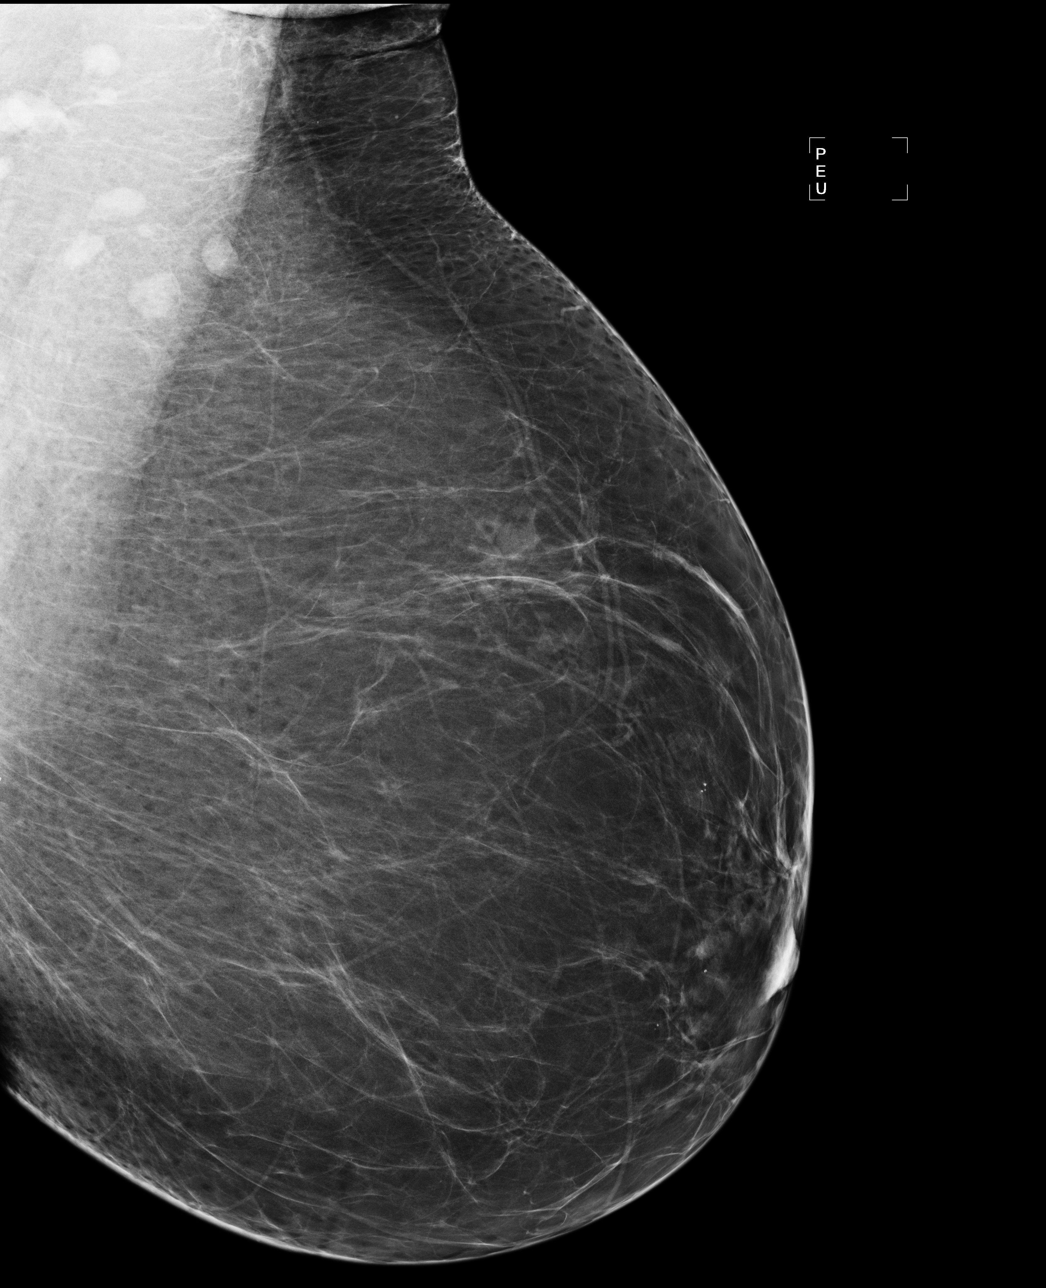

[R MLO]
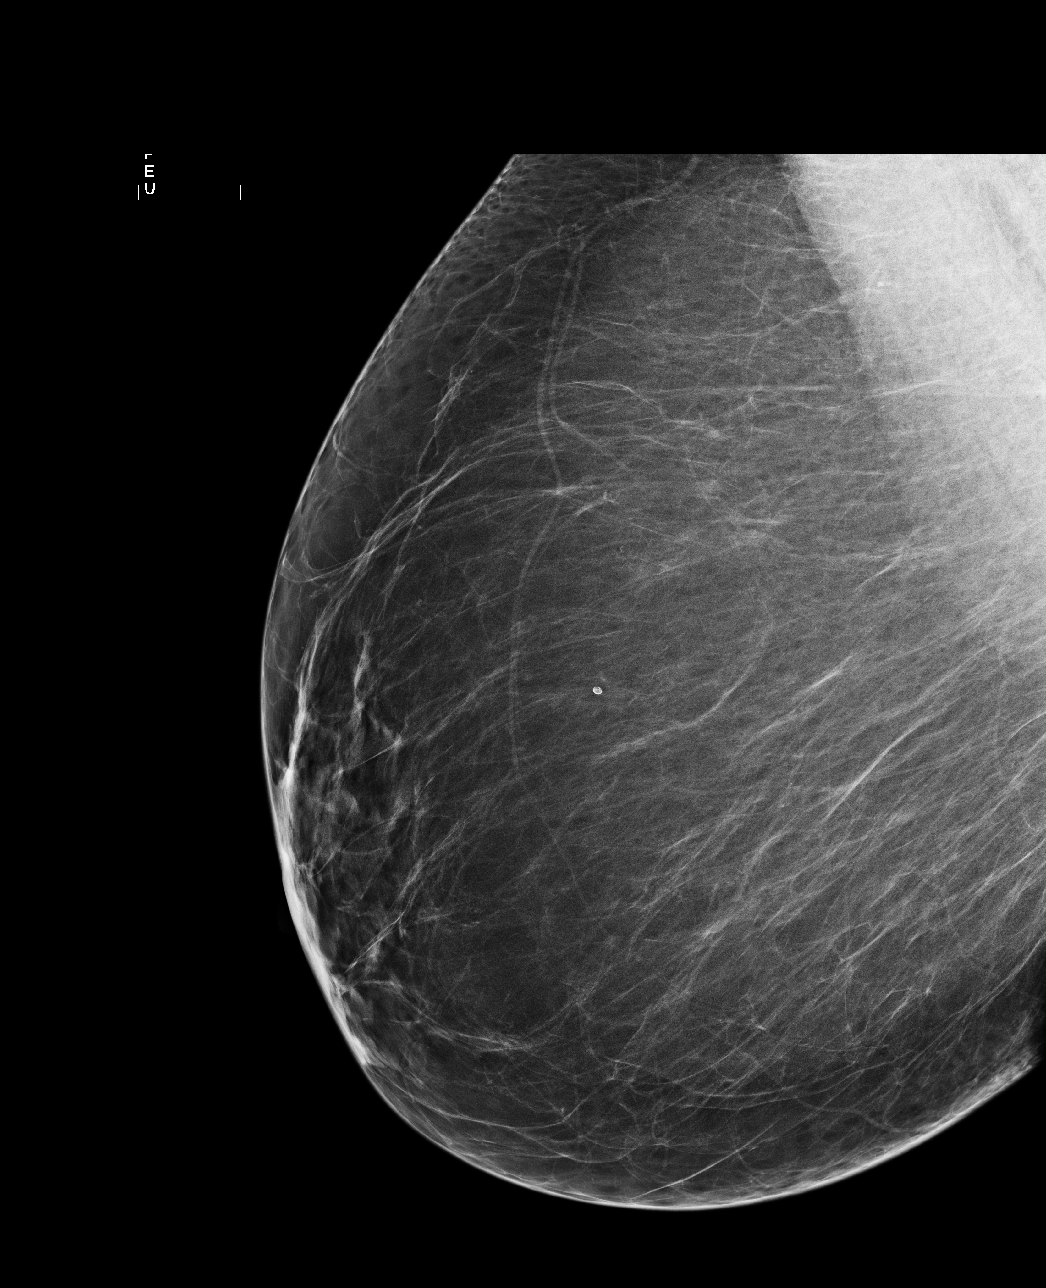

[4 of 4 positions shown; findings below may reference images not displayed]

FINDINGS: ACR Breast Density Category 1: The breast tissue is almost entirely
fatty.

No suspicious masses, architectural distortion, or calcifications
are present.

Images were processed with CAD.
IMPRESSION: No mammographic evidence of malignancy.

A result letter of this screening mammogram will be mailed directly
to the patient.

RECOMMENDATION:
Screening mammogram in one year. (Code:[BJ])

BI-RADS CATEGORY 1:  Negative.

## 2013-02-12 ENCOUNTER — Encounter: Payer: Self-pay | Admitting: Cardiology

## 2013-02-12 ENCOUNTER — Ambulatory Visit (INDEPENDENT_AMBULATORY_CARE_PROVIDER_SITE_OTHER): Payer: PRIVATE HEALTH INSURANCE | Admitting: Cardiology

## 2013-02-12 VITALS — BP 142/90 | HR 67 | Wt 220.0 lb

## 2013-02-12 DIAGNOSIS — Z7901 Long term (current) use of anticoagulants: Secondary | ICD-10-CM

## 2013-02-12 DIAGNOSIS — I428 Other cardiomyopathies: Secondary | ICD-10-CM

## 2013-02-12 DIAGNOSIS — I359 Nonrheumatic aortic valve disorder, unspecified: Secondary | ICD-10-CM

## 2013-02-12 DIAGNOSIS — I10 Essential (primary) hypertension: Secondary | ICD-10-CM

## 2013-02-12 DIAGNOSIS — E782 Mixed hyperlipidemia: Secondary | ICD-10-CM

## 2013-02-12 DIAGNOSIS — F17200 Nicotine dependence, unspecified, uncomplicated: Secondary | ICD-10-CM

## 2013-02-12 DIAGNOSIS — Z720 Tobacco use: Secondary | ICD-10-CM

## 2013-02-12 DIAGNOSIS — I351 Nonrheumatic aortic (valve) insufficiency: Secondary | ICD-10-CM

## 2013-02-12 NOTE — Patient Instructions (Addendum)
Your physician recommends that you schedule a follow-up appointment in: [place] has requested that you have a TEE. During a TEE, sound waves are used to create images of your heart. It provides your doctor with information about the size and shape of your heart and how well your heart's chambers and valves are working. In this test, a transducer is attached to the end of a flexible tube that's guided down your throat and into your esophagus (the tube leading from you mouth to your stomach) to get a more detailed image of your heart. You are not awake for the procedure. Please see the instruction sheet given to you today. For further information please visit [email or web address].

## 2013-02-12 NOTE — Assessment & Plan Note (Signed)
Patient counseled on discontinuing. 

## 2013-02-12 NOTE — Progress Notes (Signed)
HPI: [name] is a pleasant female who has a history of nonischemic cardiomyopathy improved by most recent echocardiogram and a history of aortic insufficiency. Previous catheterization in 2004 showed no obstructive coronary disease. CT of her chest in Nov 2008 showed no thoracic aneurysm. Previous Holter monitor secondary to "dizzy spells" showed PACs and PVCs. Nuclear study April 2014 showed an ejection fraction of 62% and normal perfusion. Echocardiogram repeated in May of 2014 showed normal LV function, mild left ventricular enlargement and grade 1 diastolic dysfunction, moderate to severe aortic insufficiency with flow reversal in the descending thoracic aorta, mild mitral regurgitation and mild left atrial enlargement. BNP 47. Since she was last seen earlier this month, she continues to describe dyspnea on exertion. She has had this for 2 years but it is worse over the past 2 months. There is no orthopnea or PND. She has mild pedal edema which is also worse over the past 2 months. No exertional chest pain or syncope.  Current Outpatient Prescriptions  Medication Sig Dispense Refill  . albuterol (PROVENTIL HFA;VENTOLIN HFA) 108 (90 BASE) MCG/ACT inhaler Inhale 2 puffs into the lungs every 6 (six) hours as needed for wheezing.  1 Inhaler  0  . amlodipine-atorvastatin (CADUET) 10-20 MG per tablet TAKE ONE CAPSULE BY MOUTH EVERY DAY  30 tablet  0  . amlodipine-atorvastatin (CADUET) 10-20 MG per tablet TAKE ONE CAPSULE BY MOUTH EVERY DAY  30 tablet  0  . cholecalciferol (VITAMIN D) 1000 UNITS tablet Take 1,000 Units by mouth daily.        . fexofenadine (ALLEGRA) 60 MG tablet Take 1 tablet (60 mg total) by mouth daily.  90 tablet  1  . fluticasone (FLONASE) 50 MCG/ACT nasal spray USE 2 SPRAYS IN EACH NOSTRIL DAILY. PT NEEDS TO SCHEDULE A FOLLOW UP APPT BEFORE NEXT REFILL.  16 g  0  . furosemide (LASIX) 20 MG tablet Take 20 mg by mouth 2 (two) times daily.      Marland Kitchen HYDROcodone-homatropine (HYCODAN)  5-1.5 MG/5ML syrup Take 5 mLs by mouth every 8 (eight) hours as needed for cough.  120 mL  0  . metoprolol tartrate (LOPRESSOR) 25 MG tablet TAKE 1 TABLET BY MOUTH TWICE A DAY  60 tablet  0  . potassium chloride SA (K-DUR,KLOR-CON) 20 MEQ tablet Take 1 tablet (20 mEq total) by mouth daily.  30 tablet  6  . ramipril (ALTACE) 10 MG capsule TAKE ONE CAPSULE BY MOUTH TWICE A DAY  60 capsule  0  . warfarin (COUMADIN) 5 MG tablet TAKE AS DIRECTED  60 tablet  0   No current facility-administered medications for this visit.     Past Medical History  Diagnosis Date  . Other primary cardiomyopathies   . Hypertension   . Aortic valve disorders   . Hyperlipidemia   . Personal history of colonic polyps   . Personal history of venous thrombosis and embolism   . Asthma   . Allergic rhinitis   . Long term (current) use of anticoagulants     Past Surgical History  Procedure Laterality Date  . Abdominal hysterectomy    . Rt knee arthoscopic    . Dilation and curettage of uterus      several  . Cardiac catheterization      History   Social History  . Marital Status: Legally Separated    Spouse Name: N/A    Number of Children: N/A  . Years of Education: N/A   Occupational History  .  Not on file.   Social History Main Topics  . Smoking status: Current Some Day Smoker  . Smokeless tobacco: Not on file  . Alcohol Use: Yes  . Drug Use: No  . Sexually Active: Not on file   Other Topics Concern  . 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 Remaining systems are negative.  Physical Exam: Well-developed well-nourished in no acute distress.  Skin is warm  and dry.  HEENT is normal.  Neck is supple.  Chest is clear to auscultation with normal expansion.  Cardiovascular exam is regular rate and rhythm. 2/6 systolic and diastolic murmur the sternal border. Abdominal exam nontender or distended. No masses palpated. Extremities show trace edema. neuro grossly intact

## 2013-02-12 NOTE — Assessment & Plan Note (Addendum)
Patient has moderate to severe aortic insufficiency on her transthoracic echocardiogram. This certainly could be contributing to her dyspnea. I will arrange a transesophageal echocardiogram to further assess. Pt may require AVR in the future.

## 2013-02-12 NOTE — Assessment & Plan Note (Signed)
Continue statin. 

## 2013-02-12 NOTE — Assessment & Plan Note (Signed)
Continue Coumadin for history of recurrent DVT. No recent interruption.

## 2013-02-12 NOTE — Assessment & Plan Note (Signed)
Blood pressure is elevated but she follows this at home and it is typically controlled. I have asked her to follow this closely. This is particularly important as her blood pressure elevation would increase her aortic insufficiency. We will add additional medications as needed.

## 2013-02-12 NOTE — Assessment & Plan Note (Signed)
History of cardiomyopathy improved. Continue ACE inhibitor and beta blocker.

## 2013-02-14 ENCOUNTER — Telehealth: Payer: Self-pay | Admitting: *Deleted

## 2013-02-14 NOTE — Telephone Encounter (Signed)
Unable to reach pt or leave a message, pt is scheduled for TEE per dr Stanford Breed 02-21-13 @ 11 am. She will need to arrive at 10 am. She will hold her bystolic prior to the procedure.

## 2013-02-18 ENCOUNTER — Other Ambulatory Visit: Payer: Self-pay | Admitting: Cardiology

## 2013-02-18 DIAGNOSIS — I359 Nonrheumatic aortic valve disorder, unspecified: Secondary | ICD-10-CM

## 2013-02-18 NOTE — Telephone Encounter (Signed)
Spoke with pt, aware of TEE instructions.

## 2013-02-21 ENCOUNTER — Ambulatory Visit (HOSPITAL_COMMUNITY)
Admission: RE | Admit: 2013-02-21 | Discharge: 2013-02-21 | Disposition: A | Discharge status: Home / Self Care | Payer: PRIVATE HEALTH INSURANCE | Source: Ambulatory Visit | Attending: Cardiology | Admitting: Cardiology

## 2013-02-21 ENCOUNTER — Encounter (HOSPITAL_COMMUNITY)
Admission: RE | Disposition: A | Discharge status: Home / Self Care | Payer: Self-pay | Source: Ambulatory Visit | Attending: Cardiology

## 2013-02-21 ENCOUNTER — Encounter (HOSPITAL_COMMUNITY): Payer: Self-pay

## 2013-02-21 DIAGNOSIS — E785 Hyperlipidemia, unspecified: Secondary | ICD-10-CM | POA: Insufficient documentation

## 2013-02-21 DIAGNOSIS — Z79899 Other long term (current) drug therapy: Secondary | ICD-10-CM | POA: Insufficient documentation

## 2013-02-21 DIAGNOSIS — I519 Heart disease, unspecified: Secondary | ICD-10-CM | POA: Insufficient documentation

## 2013-02-21 DIAGNOSIS — I10 Essential (primary) hypertension: Secondary | ICD-10-CM | POA: Insufficient documentation

## 2013-02-21 DIAGNOSIS — J45909 Unspecified asthma, uncomplicated: Secondary | ICD-10-CM | POA: Insufficient documentation

## 2013-02-21 DIAGNOSIS — I359 Nonrheumatic aortic valve disorder, unspecified: Secondary | ICD-10-CM

## 2013-02-21 DIAGNOSIS — Z7901 Long term (current) use of anticoagulants: Secondary | ICD-10-CM | POA: Insufficient documentation

## 2013-02-21 DIAGNOSIS — I517 Cardiomegaly: Secondary | ICD-10-CM | POA: Insufficient documentation

## 2013-02-21 DIAGNOSIS — J309 Allergic rhinitis, unspecified: Secondary | ICD-10-CM | POA: Insufficient documentation

## 2013-02-21 DIAGNOSIS — I428 Other cardiomyopathies: Secondary | ICD-10-CM | POA: Insufficient documentation

## 2013-02-21 DIAGNOSIS — Z86718 Personal history of other venous thrombosis and embolism: Secondary | ICD-10-CM | POA: Insufficient documentation

## 2013-02-21 DIAGNOSIS — I080 Rheumatic disorders of both mitral and aortic valves: Secondary | ICD-10-CM | POA: Insufficient documentation

## 2013-02-21 DIAGNOSIS — I700 Atherosclerosis of aorta: Secondary | ICD-10-CM | POA: Insufficient documentation

## 2013-02-21 DIAGNOSIS — F17200 Nicotine dependence, unspecified, uncomplicated: Secondary | ICD-10-CM | POA: Insufficient documentation

## 2013-02-21 HISTORY — PX: TEE WITHOUT CARDIOVERSION: SHX5443

## 2013-02-21 SURGERY — ECHOCARDIOGRAM, TRANSESOPHAGEAL
Anesthesia: Moderate Sedation

## 2013-02-21 MED ORDER — FENTANYL CITRATE 0.05 MG/ML IJ SOLN
INTRAMUSCULAR | Status: AC
  Filled 2013-02-21: qty 2

## 2013-02-21 MED ORDER — SODIUM CHLORIDE 0.9 % IV SOLN
INTRAVENOUS | Status: DC
  Administered 2013-02-21: 500 mL via INTRAVENOUS

## 2013-02-21 MED ORDER — BUTAMBEN-TETRACAINE-BENZOCAINE 2-2-14 % EX AERO
INHALATION_SPRAY | CUTANEOUS | Status: DC | PRN
  Administered 2013-02-21: 2 via TOPICAL

## 2013-02-21 MED ORDER — FENTANYL CITRATE 0.05 MG/ML IJ SOLN
INTRAMUSCULAR | Status: DC | PRN
  Administered 2013-02-21: 25 ug via INTRAVENOUS

## 2013-02-21 MED ORDER — DIPHENHYDRAMINE HCL 50 MG/ML IJ SOLN
INTRAMUSCULAR | Status: AC
  Filled 2013-02-21: qty 1

## 2013-02-21 MED ORDER — MIDAZOLAM HCL 10 MG/2ML IJ SOLN
INTRAMUSCULAR | Status: DC | PRN
  Administered 2013-02-21 (×2): 2 mg via INTRAVENOUS

## 2013-02-21 MED ORDER — MIDAZOLAM HCL 5 MG/ML IJ SOLN
INTRAMUSCULAR | Status: AC
  Filled 2013-02-21: qty 2

## 2013-02-21 NOTE — CV Procedure (Signed)
See full TEE report in [facility].  Ashe Graybeal  

## 2013-02-21 NOTE — H&P (Signed)
[name] Foster  02/12/2013 4:30 PM   Office Visit  MRN:  [id number]   Description: 62 year old female  Provider: Lelon Perla, MD  Department: [facility]        Referring Provider    Burnis Medin, MD      Diagnoses    Aortic insufficiency    -  Primary    424.1    Other primary cardiomyopathies        425.4    Chronic anticoagulation        V58.61    Mixed hyperlipidemia        272.2    Unspecified essential hypertension        401.9    Tobacco user        305.1      Reason for Visit    Follow-up    Aortic insufficiency         Progress Notes    Lelon Perla, MD at 02/12/2013  4:59 PM    Status: Signed                      HPI: [name] Foster is a pleasant female who has a history of nonischemic cardiomyopathy improved by most recent echocardiogram and a history of aortic insufficiency. Previous catheterization in 2004 showed no obstructive coronary disease. CT of her chest in Nov 2008 showed no thoracic aneurysm. Previous Holter monitor secondary to "dizzy spells" showed PACs and PVCs. Nuclear study April 2014 showed an ejection fraction of 62% and normal perfusion. Echocardiogram repeated in May of 2014 showed normal LV function, mild left ventricular enlargement and grade 1 diastolic dysfunction, moderate to severe aortic insufficiency with flow reversal in the descending thoracic aorta, mild mitral regurgitation and mild left atrial enlargement. BNP 47. Since she was last seen earlier this month, she continues to describe dyspnea on exertion. She has had this for 2 years but it is worse over the past 2 months. There is no orthopnea or PND. She has mild pedal edema which is also worse over the past 2 months. No exertional chest pain or syncope.    Current Outpatient Prescriptions   Medication  Sig  Dispense  Refill   .  albuterol (PROVENTIL HFA;VENTOLIN HFA) 108 (90 BASE) MCG/ACT inhaler  Inhale 2 puffs into the lungs every 6 (six) hours as  needed for wheezing.   1 Inhaler   0   .  amlodipine-atorvastatin (CADUET) 10-20 MG per tablet  TAKE ONE CAPSULE BY MOUTH EVERY DAY   30 tablet   0   .  amlodipine-atorvastatin (CADUET) 10-20 MG per tablet  TAKE ONE CAPSULE BY MOUTH EVERY DAY   30 tablet   0   .  cholecalciferol (VITAMIN D) 1000 UNITS tablet  Take 1,000 Units by mouth daily.           .  fexofenadine (ALLEGRA) 60 MG tablet  Take 1 tablet (60 mg total) by mouth daily.   90 tablet   1   .  fluticasone (FLONASE) 50 MCG/ACT nasal spray  USE 2 SPRAYS IN EACH NOSTRIL DAILY. PT NEEDS TO SCHEDULE A FOLLOW UP APPT BEFORE NEXT REFILL.   16 g   0   .  furosemide (LASIX) 20 MG tablet  Take 20 mg by mouth 2 (two) times daily.         Marland Kitchen  HYDROcodone-homatropine (HYCODAN) 5-1.5 MG/5ML syrup  Take 5 mLs by mouth every 8 (  eight) hours as needed for cough.   120 mL   0   .  metoprolol tartrate (LOPRESSOR) 25 MG tablet  TAKE 1 TABLET BY MOUTH TWICE A DAY   60 tablet   0   .  potassium chloride SA (K-DUR,KLOR-CON) 20 MEQ tablet  Take 1 tablet (20 mEq total) by mouth daily.   30 tablet   6   .  ramipril (ALTACE) 10 MG capsule  TAKE ONE CAPSULE BY MOUTH TWICE A DAY   60 capsule   0   .  warfarin (COUMADIN) 5 MG tablet  TAKE AS DIRECTED   60 tablet   0       No current facility-administered medications for this visit.           Past Medical History   Diagnosis  Date   .  Other primary cardiomyopathies     .  Hypertension     .  Aortic valve disorders     .  Hyperlipidemia     .  Personal history of colonic polyps     .  Personal history of venous thrombosis and embolism     .  Asthma     .  Allergic rhinitis     .  Long term (current) use of anticoagulants           Past Surgical History   Procedure  Laterality  Date   .  Abdominal hysterectomy       .  Rt knee arthoscopic       .  Dilation and curettage of uterus           several   .  Cardiac catheterization             History       Social History   .  Marital Status:   Legally Separated       Spouse Name:  N/A       Number of Children:  N/A   .  Years of Education:  N/A       Occupational History   .  Not on file.       Social History Main Topics   .  Smoking status:  Current Some Day Smoker   .  Smokeless tobacco:  Not on file   .  Alcohol Use:  Yes   .  Drug Use:  No   .  Sexually Active:  Not on file       Other Topics  Concern   .  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      Divorced     Regular exercise- no     5 hours sleep      Lives with a son age 34      No pets                  ROS: no fevers or chills, productive cough, hemoptysis, dysphasia, odynophagia, melena, hematochezia, dysuria, hematuria, rash, seizure activity, orthopnea, PND, pedal edema, claudication. Remaining systems are negative.   Physical Exam: Well-developed well-nourished in no acute distress.   Skin is warm and dry.   HEENT is normal.   Neck is supple.   Chest is clear to auscultation with normal expansion.   Cardiovascular exam is regular rate and rhythm. 2/6 systolic and diastolic murmur the sternal border. Abdominal exam nontender or  distended. No masses palpated. Extremities show trace edema. neuro grossly intact                      Aortic insufficiency - Lelon Perla, MD at 02/12/2013  4:56 PM    Status: Alison Stalling Related Problem: Aortic insufficiency           Patient has moderate to severe aortic insufficiency on her transthoracic echocardiogram. This certainly could be contributing to her dyspnea. I will arrange a transesophageal echocardiogram to further assess. Pt may require AVR in the future.                Revision History       Date/Time User Action    > 02/12/2013  4:56 PM Lelon Perla, MD Edit      02/12/2013  4:56 PM Lelon Perla, MD Create              CARDIOMYOPATHY, PRIMARY NEC - Lelon Perla, MD at 02/12/2013  4:56 PM    Status: Written  Related Problem: CARDIOMYOPATHY, PRIMARY NEC           History of cardiomyopathy improved. Continue ACE inhibitor and beta blocker.         Chronic anticoagulation - Lelon Perla, MD at 02/12/2013  4:57 PM    Status: Written Related Problem: Chronic anticoagulation           Continue Coumadin for history of recurrent DVT. No recent interruption.         HYPERLIPIDEMIA - Lelon Perla, MD at 02/12/2013  4:57 PM    Status: Written Related Problem: HYPERLIPIDEMIA           Continue statin.         HYPERTENSION - Lelon Perla, MD at 02/12/2013  4:58 PM    Status: Written Related Problem: HYPERTENSION           Blood pressure is elevated but she follows this at home and it is typically controlled. I have asked her to follow this closely. This is particularly important as her blood pressure elevation would increase her aortic insufficiency. We will add additional medications as needed.         Tobacco user - Lelon Perla, MD at 02/12/2013  4:58 PM    Status: Written Related Problem: Tobacco user           Patient counseled on discontinuing.    For TEE; no changes Kirk Ruths

## 2013-02-21 NOTE — Interval H&P Note (Signed)
History and Physical Interval Note:  02/21/2013 11:21 AM  [name] Foster  has presented today for surgery, with the diagnosis of severe aeortic insufficiency  The various methods of treatment have been discussed with the patient and family. After consideration of risks, benefits and other options for treatment, the patient has consented to  Procedure(s): TRANSESOPHAGEAL ECHOCARDIOGRAM (TEE) (N/A) as a surgical intervention .  The patient's history has been reviewed, patient examined, no change in status, stable for surgery.  I have reviewed the patient's chart and labs.  Questions were answered to the patient's satisfaction.     Kirk Ruths

## 2013-02-21 NOTE — Progress Notes (Signed)
  Echocardiogram Echocardiogram Transesophageal has been performed.  Mauricio Po 02/21/2013, 11:47 AM

## 2013-02-25 ENCOUNTER — Other Ambulatory Visit: Payer: Self-pay | Admitting: *Deleted

## 2013-02-25 ENCOUNTER — Telehealth: Payer: Self-pay | Admitting: Family

## 2013-02-25 DIAGNOSIS — I359 Nonrheumatic aortic valve disorder, unspecified: Secondary | ICD-10-CM

## 2013-02-25 NOTE — Telephone Encounter (Signed)
Please call patient and get her setup to have her INR managed. If unable to reach, please send a letter. Donney Dice has not been checked in quite some time.

## 2013-02-25 NOTE — Telephone Encounter (Signed)
I will follow-up with her monthly and as needed as well as document her INRs. Please see below.

## 2013-02-25 NOTE — Telephone Encounter (Signed)
Pt states that she has been monitoring pt/inr herself. She checks it at work and she says that it has been running between 2-2.5. She says that because it has been normal lately, she has been checking it monthly. She states that she cannot afford to pay $20 copay to come in the office to have it monitored.  I advised pt that we will have to have results scanned into her chart, therefore she will need to fax results into the office after each check. Also advised her that we will not be able to fill her coumadin with knowledge of her inr #. Pt states that she will start faxing results to office.

## 2013-03-07 ENCOUNTER — Other Ambulatory Visit: Payer: Self-pay | Admitting: Internal Medicine

## 2013-03-10 ENCOUNTER — Other Ambulatory Visit: Payer: Self-pay | Admitting: Internal Medicine

## 2013-03-12 ENCOUNTER — Other Ambulatory Visit: Payer: Self-pay | Admitting: Internal Medicine

## 2013-03-13 ENCOUNTER — Telehealth: Payer: Self-pay | Admitting: Internal Medicine

## 2013-03-13 NOTE — Telephone Encounter (Signed)
PT called to request her refills WITHOUT  A refill. She states that she has seen Dr. Stanford Breed in the last few months and had labs with him, so she would like her refills without having to come in for an appt. Please assist.

## 2013-03-14 ENCOUNTER — Other Ambulatory Visit: Payer: Self-pay | Admitting: Family Medicine

## 2013-03-14 MED ORDER — FEXOFENADINE HCL 60 MG PO TABS: 60.0000 mg | ORAL_TABLET | Freq: Every day | ORAL | Status: DC

## 2013-03-14 NOTE — Telephone Encounter (Signed)
Please read below and make decision about medication.  Thanks!!

## 2013-03-14 NOTE — Telephone Encounter (Signed)
Please ask Dr Stanford Breed can take over the refills of her her CV medication  amlodipine  If she cannot make a yearly office visit . To [place].   refill the allegra for 90 days . Refill x 1

## 2013-03-14 NOTE — Telephone Encounter (Signed)
Refilled Allegra and sent by e-scribe.

## 2013-03-14 NOTE — Telephone Encounter (Signed)
What medications does the pt need refilled?

## 2013-03-14 NOTE — Telephone Encounter (Signed)
She would like to receive refills of her Amlodipine, and allegra, she would like them sent to [facility] on [address]. Thank you!

## 2013-03-15 NOTE — Telephone Encounter (Signed)
Refill cardiac meds as needed Kirk Ruths

## 2013-03-18 MED ORDER — AMLODIPINE-ATORVASTATIN 10-20 MG PO TABS: 1.0000 | ORAL_TABLET | Freq: Every day | ORAL | Status: DC

## 2013-03-18 NOTE — Telephone Encounter (Signed)
Spoke with pt, aware refill sent to [facility]

## 2013-03-31 ENCOUNTER — Encounter: Payer: Self-pay | Admitting: Cardiology

## 2013-03-31 ENCOUNTER — Ambulatory Visit (INDEPENDENT_AMBULATORY_CARE_PROVIDER_SITE_OTHER): Payer: PRIVATE HEALTH INSURANCE | Admitting: Cardiology

## 2013-03-31 VITALS — BP 135/89 | HR 65 | Ht 66.0 in | Wt 218.0 lb

## 2013-03-31 DIAGNOSIS — Z7901 Long term (current) use of anticoagulants: Secondary | ICD-10-CM

## 2013-03-31 DIAGNOSIS — Z720 Tobacco use: Secondary | ICD-10-CM

## 2013-03-31 DIAGNOSIS — I351 Nonrheumatic aortic (valve) insufficiency: Secondary | ICD-10-CM

## 2013-03-31 DIAGNOSIS — I359 Nonrheumatic aortic valve disorder, unspecified: Secondary | ICD-10-CM

## 2013-03-31 DIAGNOSIS — F17200 Nicotine dependence, unspecified, uncomplicated: Secondary | ICD-10-CM

## 2013-03-31 DIAGNOSIS — I428 Other cardiomyopathies: Secondary | ICD-10-CM

## 2013-03-31 DIAGNOSIS — E782 Mixed hyperlipidemia: Secondary | ICD-10-CM

## 2013-03-31 DIAGNOSIS — I10 Essential (primary) hypertension: Secondary | ICD-10-CM

## 2013-03-31 MED ORDER — FUROSEMIDE 20 MG PO TABS: 20.0000 mg | ORAL_TABLET | Freq: Every day | ORAL | Status: DC

## 2013-03-31 NOTE — Assessment & Plan Note (Signed)
Ejection fraction now 50-55%. Continue ACE inhibitor and beta blocker.

## 2013-03-31 NOTE — Assessment & Plan Note (Signed)
Patient counseled on discontinuing. 

## 2013-03-31 NOTE — Assessment & Plan Note (Signed)
Continue Coumadin for history of recurrent DVT.

## 2013-03-31 NOTE — Progress Notes (Signed)
HPI: [name] Foster is a pleasant female who has a history of nonischemic cardiomyopathy improved by most recent echocardiogram and a history of aortic insufficiency for fu. Previous catheterization in 2004 showed no obstructive coronary disease. CT of her chest in Nov 2008 showed no thoracic aneurysm. Previous Holter monitor secondary to "dizzy spells" showed PACs and PVCs. Nuclear study April 2014 showed an ejection fraction of 62% and normal perfusion. Echocardiogram repeated in May of 2014 showed normal LV function, mild left ventricular enlargement and grade 1 diastolic dysfunction, moderate to severe aortic insufficiency with flow reversal in the descending thoracic aorta, mild mitral regurgitation and mild left atrial enlargement. BNP 47. Patient underwent transesophageal echocardiogram in may of 2014. Her ejection fraction was 50-55%. There was moderate aortic insufficiency and mild mitral regurgitation. There was a linear density associated with the atrial septum of uncertain etiology. Followup echo recommended in 3 months. Since she was last seen, she has dyspnea with more moderate activities but not routine activities. No orthopnea, PND, chest pain or syncope. Occasional mild edema.   Current Outpatient Prescriptions  Medication Sig Dispense Refill  . albuterol (PROVENTIL HFA;VENTOLIN HFA) 108 (90 BASE) MCG/ACT inhaler Inhale 2 puffs into the lungs every 6 (six) hours as needed for wheezing.  1 Inhaler  0  . amlodipine-atorvastatin (CADUET) 10-20 MG per tablet Take 1 tablet by mouth daily.  30 tablet  12  . cholecalciferol (VITAMIN D) 1000 UNITS tablet Take 1,000 Units by mouth daily.        . fexofenadine (ALLEGRA) 60 MG tablet Take 1 tablet (60 mg total) by mouth daily.  90 tablet  0  . fluticasone (FLONASE) 50 MCG/ACT nasal spray USE 2 SPRAYS IN EACH NOSTRIL DAILY. PT NEEDS TO SCHEDULE A FOLLOW UP APPT BEFORE NEXT REFILL.  16 g  0  . furosemide (LASIX) 20 MG tablet Take 20 mg by mouth 2  (two) times daily.      . metoprolol tartrate (LOPRESSOR) 25 MG tablet TAKE 1 TABLET BY MOUTH TWICE A DAY  60 tablet  0  . potassium chloride SA (K-DUR,KLOR-CON) 20 MEQ tablet Take 1 tablet (20 mEq total) by mouth daily.  30 tablet  6  . ramipril (ALTACE) 10 MG capsule TAKE ONE CAPSULE BY MOUTH TWICE A DAY  60 capsule  0  . warfarin (COUMADIN) 5 MG tablet TAKE AS DIRECTED  60 tablet  0   No current facility-administered medications for this visit.     Past Medical History  Diagnosis Date  . Other primary cardiomyopathies   . Hypertension   . Aortic valve disorders   . Hyperlipidemia   . Personal history of colonic polyps   . Personal history of venous thrombosis and embolism   . Asthma   . Allergic rhinitis   . Long term (current) use of anticoagulants     Past Surgical History  Procedure Laterality Date  . Abdominal hysterectomy    . Rt knee arthoscopic    . Dilation and curettage of uterus      several  . Cardiac catheterization    . Tee without cardioversion [name] Foster 02/21/2013    Procedure: TRANSESOPHAGEAL ECHOCARDIOGRAM (TEE);  Surgeon: Lelon Perla, MD;  Location: [facility] ENDOSCOPY;  Service: Cardiovascular;  Laterality: [name] Foster;    History   Social History  . Marital Status: Legally Separated    Spouse Name: [name] Foster    Number of Children: [name] Foster  . Years of Education: [name] Foster   Occupational History  . Not on  file.   Social History Main Topics  . Smoking status: Current Some Day Smoker -- 0.25 packs/day  . Smokeless tobacco: Not on file  . Alcohol Use: Yes     Comment: rare occasion  . Drug Use: No  . Sexually Active: Not on file   Other Topics Concern  . 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    Divorced   Regular exercise- no   5 hours sleep    Lives with a son age 80    No pets          ROS: no fevers or chills, productive cough, hemoptysis, dysphasia, odynophagia, melena, hematochezia, dysuria, hematuria, rash,  seizure activity, orthopnea, PND, pedal edema, claudication. Remaining systems are negative.  Physical Exam: Well-developed well-nourished in no acute distress.  Skin is warm and dry.  HEENT is normal.  Neck is supple.  Chest is clear to auscultation with normal expansion.  Cardiovascular exam is regular rate and rhythm. 2/6 systolic murmur left sternal border. Abdominal exam nontender or distended. No masses palpated. Extremities show trace edema. neuro grossly intact

## 2013-03-31 NOTE — Assessment & Plan Note (Signed)
Patient's aortic insufficiency appeared to be moderate on transesophageal echocardiogram. There was a question linear density associated with the atrial septum. We will plan followup echocardiogram in 3 months. We will follow her clinically and for evidence of further reduction in LV function or left ventricular enlargement. She may require aortic valve replacement in the future. Her symptoms at present are stable.

## 2013-03-31 NOTE — Assessment & Plan Note (Signed)
Continue statin. 

## 2013-03-31 NOTE — Assessment & Plan Note (Signed)
Patient states blood pressure is running 130 to 140 at home. Given aortic insufficiency I would like for this to be lower. Add Lasix 20 mg daily. Check potassium and renal function in one week.

## 2013-03-31 NOTE — Patient Instructions (Addendum)
Your physician recommends that you schedule a follow-up appointment in:   START FUROSEMIDE 20 [place]  Your physician recommends that you return for lab work in: [place]

## 2013-04-07 ENCOUNTER — Other Ambulatory Visit (INDEPENDENT_AMBULATORY_CARE_PROVIDER_SITE_OTHER): Payer: PRIVATE HEALTH INSURANCE

## 2013-04-07 DIAGNOSIS — I359 Nonrheumatic aortic valve disorder, unspecified: Secondary | ICD-10-CM

## 2013-04-07 LAB — BASIC METABOLIC PANEL
CO2: 30 mEq/L (ref 19–32)
Calcium: 9.1 mg/dL (ref 8.4–10.5)
Chloride: 105 mEq/L (ref 96–112)
Creatinine, Ser: 1.2 mg/dL (ref 0.4–1.2)
Sodium: 141 mEq/L (ref 135–145)

## 2013-04-12 ENCOUNTER — Other Ambulatory Visit: Payer: Self-pay | Admitting: Internal Medicine

## 2013-04-14 NOTE — Telephone Encounter (Signed)
Not seen here since 11/2011.  Did see cardiology on 01/31/13.  Cardiology said to continue medications.  Please advise.  Thanks!!

## 2013-04-15 NOTE — Telephone Encounter (Signed)
Please have coumadin clinic provider refill her coumadin .  Ov  With me For med evaluation   Refill    OR Have cardiology refill her meds and ov not needed .  But  dont have her run out of meds  .

## 2013-04-22 ENCOUNTER — Other Ambulatory Visit: Payer: Self-pay | Admitting: Cardiology

## 2013-05-05 ENCOUNTER — Ambulatory Visit: Payer: PRIVATE HEALTH INSURANCE | Admitting: Cardiology

## 2013-05-14 ENCOUNTER — Ambulatory Visit (HOSPITAL_COMMUNITY): Payer: PRIVATE HEALTH INSURANCE | Attending: Cardiology | Admitting: Radiology

## 2013-05-14 DIAGNOSIS — R609 Edema, unspecified: Secondary | ICD-10-CM

## 2013-05-14 DIAGNOSIS — I351 Nonrheumatic aortic (valve) insufficiency: Secondary | ICD-10-CM

## 2013-05-14 DIAGNOSIS — I359 Nonrheumatic aortic valve disorder, unspecified: Secondary | ICD-10-CM

## 2013-05-14 DIAGNOSIS — I509 Heart failure, unspecified: Secondary | ICD-10-CM

## 2013-05-14 DIAGNOSIS — I428 Other cardiomyopathies: Secondary | ICD-10-CM | POA: Insufficient documentation

## 2013-05-14 DIAGNOSIS — R079 Chest pain, unspecified: Secondary | ICD-10-CM

## 2013-05-14 DIAGNOSIS — R0602 Shortness of breath: Secondary | ICD-10-CM | POA: Insufficient documentation

## 2013-05-14 DIAGNOSIS — R072 Precordial pain: Secondary | ICD-10-CM

## 2013-05-14 NOTE — Progress Notes (Signed)
Echocardiogram performed.  

## 2013-05-15 ENCOUNTER — Telehealth: Payer: Self-pay | Admitting: Cardiology

## 2013-05-15 NOTE — Telephone Encounter (Signed)
Follow up  Pt states she is returning a miss called, she believes is about her echo results.

## 2013-05-15 NOTE — Telephone Encounter (Signed)
Spoke with pt, aware of echo results. 

## 2013-05-19 ENCOUNTER — Other Ambulatory Visit: Payer: Self-pay | Admitting: Internal Medicine

## 2013-05-20 ENCOUNTER — Ambulatory Visit (INDEPENDENT_AMBULATORY_CARE_PROVIDER_SITE_OTHER): Payer: PRIVATE HEALTH INSURANCE | Admitting: Cardiology

## 2013-05-20 ENCOUNTER — Encounter: Payer: Self-pay | Admitting: Cardiology

## 2013-05-20 VITALS — BP 140/82 | HR 54 | Ht 66.0 in | Wt 223.0 lb

## 2013-05-20 DIAGNOSIS — I351 Nonrheumatic aortic (valve) insufficiency: Secondary | ICD-10-CM

## 2013-05-20 DIAGNOSIS — I428 Other cardiomyopathies: Secondary | ICD-10-CM

## 2013-05-20 DIAGNOSIS — E782 Mixed hyperlipidemia: Secondary | ICD-10-CM

## 2013-05-20 DIAGNOSIS — I359 Nonrheumatic aortic valve disorder, unspecified: Secondary | ICD-10-CM

## 2013-05-20 DIAGNOSIS — Z7901 Long term (current) use of anticoagulants: Secondary | ICD-10-CM

## 2013-05-20 DIAGNOSIS — I10 Essential (primary) hypertension: Secondary | ICD-10-CM

## 2013-05-20 DIAGNOSIS — Z720 Tobacco use: Secondary | ICD-10-CM

## 2013-05-20 DIAGNOSIS — F17200 Nicotine dependence, unspecified, uncomplicated: Secondary | ICD-10-CM

## 2013-05-20 DIAGNOSIS — I509 Heart failure, unspecified: Secondary | ICD-10-CM

## 2013-05-20 MED ORDER — HYDRALAZINE HCL 25 MG PO TABS: 25.0000 mg | ORAL_TABLET | Freq: Three times a day (TID) | ORAL | Status: DC

## 2013-05-20 NOTE — Assessment & Plan Note (Signed)
For history of recurrent DVT.

## 2013-05-20 NOTE — Patient Instructions (Signed)
Your physician wants you to follow-up in: [place] will receive a reminder letter in the mail two months in advance. If you don't receive a letter, please call our office to schedule the follow-up appointment.   START HYDRALAZINE 25 MG ONE TABLET THREE TIMES DAILY

## 2013-05-20 NOTE — Assessment & Plan Note (Signed)
Patient has discontinued. 

## 2013-05-20 NOTE — Progress Notes (Signed)
HPI: [name] is a pleasant female who has a history of nonischemic cardiomyopathy improved by most recent echocardiogram and a history of aortic insufficiency for fu. Previous catheterization in 2004 showed no obstructive coronary disease. CT of her chest in Nov 2008 showed no thoracic aneurysm. Previous Holter monitor secondary to "dizzy spells" showed PACs and PVCs. Nuclear study April 2014 showed an ejection fraction of 62% and normal perfusion. Patient underwent transesophageal echocardiogram in May of 2014. Her ejection fraction was 50-55%. There was moderate aortic insufficiency and mild mitral regurgitation. There was a linear density associated with the atrial septum of uncertain etiology. Followup echo in August of 2014 showed an ejection fraction of 0000000, grade 1 diastolic dysfunction, mild left atrial enlargement, mild to moderate aortic insufficiency and mild mitral regurgitation. I last saw her in June of 2014. Since then, she has mild dyspnea on exertion but no orthopnea, PND, pedal edema, exertional chest pain or syncope.   Current Outpatient Prescriptions  Medication Sig Dispense Refill  . albuterol (PROVENTIL HFA;VENTOLIN HFA) 108 (90 BASE) MCG/ACT inhaler Inhale 2 puffs into the lungs every 6 (six) hours as needed for wheezing.  1 Inhaler  0  . amlodipine-atorvastatin (CADUET) 10-20 MG per tablet Take 1 tablet by mouth daily.  30 tablet  12  . cholecalciferol (VITAMIN D) 1000 UNITS tablet Take 1,000 Units by mouth daily.        . fexofenadine (ALLEGRA) 60 MG tablet Take 1 tablet (60 mg total) by mouth daily.  90 tablet  0  . fluticasone (FLONASE) 50 MCG/ACT nasal spray USE 2 SPRAYS IN EACH NOSTRIL DAILY. PT NEEDS TO SCHEDULE A FOLLOW UP APPT BEFORE NEXT REFILL.  16 g  0  . furosemide (LASIX) 20 MG tablet Take 1 tablet (20 mg total) by mouth daily.  30 tablet  12  . metoprolol tartrate (LOPRESSOR) 25 MG tablet TAKE 1 TABLET BY MOUTH TWICE A DAY  60 tablet  0  . potassium  chloride SA (K-DUR,KLOR-CON) 20 MEQ tablet Take 1 tablet (20 mEq total) by mouth daily.  30 tablet  6  . ramipril (ALTACE) 10 MG capsule TAKE ONE CAPSULE BY MOUTH TWICE A DAY  60 capsule  0  . warfarin (COUMADIN) 5 MG tablet TAKE AS DIRECTED  60 tablet  0   No current facility-administered medications for this visit.     Past Medical History  Diagnosis Date  . Other primary cardiomyopathies   . Hypertension   . Aortic valve disorders   . Hyperlipidemia   . Personal history of colonic polyps   . Personal history of venous thrombosis and embolism   . Asthma   . Allergic rhinitis   . Long term (current) use of anticoagulants     Past Surgical History  Procedure Laterality Date  . Abdominal hysterectomy    . Rt knee arthoscopic    . Dilation and curettage of uterus      several  . Cardiac catheterization    . Tee without cardioversion N/A 02/21/2013    Procedure: TRANSESOPHAGEAL ECHOCARDIOGRAM (TEE);  Surgeon: Lelon Perla, MD;  Location: [facility] ENDOSCOPY;  Service: Cardiovascular;  Laterality: N/A;    History   Social History  . Marital Status: Legally Separated    Spouse Name: N/A    Number of Children: N/A  . Years of Education: N/A   Occupational History  . Not on file.   Social History Main Topics  . Smoking status: Current Some Day Smoker -- 0.25  packs/day  . Smokeless tobacco: Not on file  . Alcohol Use: Yes     Comment: rare occasion  . Drug Use: No  . Sexual Activity: Not on file   Other Topics Concern  . 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 Remaining systems are negative.  Physical Exam: Well-developed  well-nourished in no acute distress.  Skin is warm and dry.  HEENT is normal.  Neck is supple.  Chest is clear to auscultation with normal expansion.  Cardiovascular exam is regular rate and rhythm. 2/6 systolic murmur left sternal border. Abdominal exam nontender or distended. No masses palpated. Extremities show no edema. neuro grossly intact  ECG sinus bradycardia at a rate of 54. Left ventricular hypertrophy. Inferior lateral T-wave inversion.

## 2013-05-20 NOTE — Assessment & Plan Note (Signed)
Blood pressure borderline. Given history of aortic insufficiency I would like for her blood pressure to be lower. Begin hydralazine 25 mg by mouth 3 times a day.

## 2013-05-20 NOTE — Assessment & Plan Note (Signed)
LV function improved on most recent echo. Continue ACE inhibitor and beta blocker.

## 2013-05-20 NOTE — Assessment & Plan Note (Signed)
Continue statin. 

## 2013-05-20 NOTE — Assessment & Plan Note (Signed)
Patient's aortic insufficiency appeared to be moderate on transesophageal echocardiogram. We will follow her clinically and for evidence of further reduction in LV function or left ventricular enlargement. She may require aortic valve replacement in the future. Her symptoms at present are stable.

## 2013-05-21 NOTE — Telephone Encounter (Signed)
i haven't seen her since 2012  .  Needs ov to refill or evaluated any meds she is needing that I am giving her .  In the meantime time  Allegra is otc and she doesn't need prescription for that . Also nasacort is otc instead of flonase if needed

## 2013-05-21 NOTE — Telephone Encounter (Signed)
Not seen since 11/27/11. Please advise.  Thanks!!

## 2013-05-30 ENCOUNTER — Other Ambulatory Visit: Payer: Self-pay | Admitting: Cardiology

## 2013-06-12 ENCOUNTER — Other Ambulatory Visit: Payer: Self-pay | Admitting: General Practice

## 2013-06-12 ENCOUNTER — Telehealth: Payer: Self-pay | Admitting: General Practice

## 2013-06-12 MED ORDER — WARFARIN SODIUM 5 MG PO TABS: ORAL_TABLET | ORAL | Status: DC

## 2013-06-12 NOTE — Telephone Encounter (Signed)
LMOM for patient to call Villa Herb, RN @ coumadin clinic.  Following up with patient to find out if pt is still taking coumadin and if so who is following the INR.

## 2013-06-19 ENCOUNTER — Ambulatory Visit (INDEPENDENT_AMBULATORY_CARE_PROVIDER_SITE_OTHER): Payer: PRIVATE HEALTH INSURANCE | Admitting: General Practice

## 2013-06-19 DIAGNOSIS — Z7901 Long term (current) use of anticoagulants: Secondary | ICD-10-CM

## 2013-06-19 DIAGNOSIS — I82409 Acute embolism and thrombosis of unspecified deep veins of unspecified lower extremity: Secondary | ICD-10-CM

## 2013-06-19 LAB — POCT INR: INR: 2.6

## 2013-07-01 ENCOUNTER — Other Ambulatory Visit: Payer: Self-pay | Admitting: Cardiology

## 2013-07-11 ENCOUNTER — Other Ambulatory Visit: Payer: Self-pay | Admitting: General Practice

## 2013-07-11 MED ORDER — WARFARIN SODIUM 5 MG PO TABS: ORAL_TABLET | ORAL | Status: DC

## 2013-07-29 ENCOUNTER — Other Ambulatory Visit: Payer: Self-pay | Admitting: Cardiology

## 2013-07-31 ENCOUNTER — Ambulatory Visit (INDEPENDENT_AMBULATORY_CARE_PROVIDER_SITE_OTHER): Payer: PRIVATE HEALTH INSURANCE | Admitting: General Practice

## 2013-07-31 DIAGNOSIS — I82409 Acute embolism and thrombosis of unspecified deep veins of unspecified lower extremity: Secondary | ICD-10-CM

## 2013-07-31 DIAGNOSIS — Z7901 Long term (current) use of anticoagulants: Secondary | ICD-10-CM

## 2013-08-07 ENCOUNTER — Other Ambulatory Visit: Payer: Self-pay

## 2013-08-25 ENCOUNTER — Other Ambulatory Visit: Payer: Self-pay | Admitting: General Practice

## 2013-08-25 MED ORDER — WARFARIN SODIUM 5 MG PO TABS: ORAL_TABLET | ORAL | Status: DC

## 2013-09-01 ENCOUNTER — Encounter: Payer: Self-pay | Admitting: Physician Assistant

## 2013-09-01 ENCOUNTER — Ambulatory Visit (INDEPENDENT_AMBULATORY_CARE_PROVIDER_SITE_OTHER): Payer: PRIVATE HEALTH INSURANCE | Admitting: Physician Assistant

## 2013-09-01 ENCOUNTER — Telehealth: Payer: Self-pay | Admitting: Internal Medicine

## 2013-09-01 ENCOUNTER — Ambulatory Visit: Payer: PRIVATE HEALTH INSURANCE

## 2013-09-01 VITALS — BP 130/88 | HR 89 | Temp 98.9°F | Resp 22 | Ht 66.0 in | Wt 224.2 lb

## 2013-09-01 DIAGNOSIS — J4521 Mild intermittent asthma with (acute) exacerbation: Secondary | ICD-10-CM

## 2013-09-01 DIAGNOSIS — J45901 Unspecified asthma with (acute) exacerbation: Secondary | ICD-10-CM

## 2013-09-01 DIAGNOSIS — J45909 Unspecified asthma, uncomplicated: Secondary | ICD-10-CM | POA: Insufficient documentation

## 2013-09-01 MED ORDER — HYDROCOD POLST-CHLORPHEN POLST 10-8 MG/5ML PO LQCR: 5.0000 mL | Freq: Every evening | ORAL | Status: DC | PRN

## 2013-09-01 MED ORDER — IPRATROPIUM-ALBUTEROL 0.5-2.5 (3) MG/3ML IN SOLN
3.0000 mL | Freq: Once | RESPIRATORY_TRACT | Status: AC
  Administered 2013-09-01: 3 mL via RESPIRATORY_TRACT

## 2013-09-01 MED ORDER — AZITHROMYCIN 250 MG PO TABS: ORAL_TABLET | ORAL | Status: DC

## 2013-09-01 MED ORDER — PREDNISONE 20 MG PO TABS: 40.0000 mg | ORAL_TABLET | Freq: Every day | ORAL | Status: DC

## 2013-09-01 NOTE — Assessment & Plan Note (Signed)
Nebulizer treatment given in clinic with improvement in lung examination.  Rx Azithromycin.  Rx Tussionex.  Rx Prednisone burst.  Increase fluid intake.  Probiotic.  Rest.  Continue allergy medications.  Albuterol as needed.  Discussed interaction between Azithromycin and Coumadin.  Patient instructed to call coumadin clinic to see if they wish to monitor PT/INR more closely. Patient already has appt with Coumadin Clinic on Friday.

## 2013-09-01 NOTE — Progress Notes (Signed)
Patient ID: [name] Foster, female   DOB: [birth year], 62 y.o.   MRN: [id number]  Patient presents to clinic today c/o chest congestion, wheezing, cough and fever x 5 days.  Patient has history of mild, intermittent asthma for which she has an albuterol inhaler.  Has not used inhaler in several months.  Patient denies sinus pressure or pain, N/V/D.  Denies recent travel or sick contact.  States symptoms began after she was exposed to a lot of dust, but has worsened since.  Patient states cough is keeping her up at night.  Endorses chest tightness but denies chest pain.   Past Medical History  Diagnosis Date  . Other primary cardiomyopathies   . Hypertension   . Aortic valve disorders   . Hyperlipidemia   . Personal history of colonic polyps   . Personal history of venous thrombosis and embolism   . Asthma   . Allergic rhinitis   . Long term (current) use of anticoagulants     Current Outpatient Prescriptions on File Prior to Visit  Medication Sig Dispense Refill  . albuterol (PROVENTIL HFA;VENTOLIN HFA) 108 (90 BASE) MCG/ACT inhaler Inhale 2 puffs into the lungs every 6 (six) hours as needed for wheezing.  1 Inhaler  0  . amlodipine-atorvastatin (CADUET) 10-20 MG per tablet Take 1 tablet by mouth daily.  30 tablet  12  . cholecalciferol (VITAMIN D) 1000 UNITS tablet Take 1,000 Units by mouth daily.        . fexofenadine (ALLEGRA) 60 MG tablet Take 1 tablet (60 mg total) by mouth daily.  90 tablet  0  . fluticasone (FLONASE) 50 MCG/ACT nasal spray USE 2 SPRAYS IN EACH NOSTRIL DAILY. PT NEEDS TO SCHEDULE A FOLLOW UP APPT BEFORE NEXT REFILL.  16 g  0  . furosemide (LASIX) 20 MG tablet Take 1 tablet (20 mg total) by mouth daily.  30 tablet  12  . hydrALAZINE (APRESOLINE) 25 MG tablet Take 1 tablet (25 mg total) by mouth 3 (three) times daily.  90 tablet  11  . metoprolol tartrate (LOPRESSOR) 25 MG tablet TAKE 1 TABLET BY MOUTH TWICE A DAY  60 tablet  2  . potassium chloride SA (K-DUR,KLOR-CON)  20 MEQ tablet Take 1 tablet (20 mEq total) by mouth daily.  30 tablet  6  . ramipril (ALTACE) 10 MG capsule TAKE ONE CAPSULE BY MOUTH TWICE A DAY  60 capsule  0  . warfarin (COUMADIN) 5 MG tablet Take as directed by anticoagulation clinic.  35 tablet  1   No current facility-administered medications on file prior to visit.    Allergies  Allergen Reactions  . Aspirin   . Penicillins   . Tetanus Toxoid     Family History  Problem Relation Age of Onset  . Alcohol abuse    . Colon cancer    . Depression    . Hyperlipidemia    . Hypertension    . Kidney disease    . Uterine cancer    . Breast cancer      cousin  . Stroke      grandmother    History   Social History  . Marital Status: Legally Separated    Spouse Name: N/A    Number of Children: N/A  . Years of Education: N/A   Social History Main Topics  . Smoking status: Current Some Day Smoker -- 0.25 packs/day  . Smokeless tobacco: None  . Alcohol Use: Yes     Comment: rare occasion  .  Drug Use: No  . Sexual Activity: None   Other Topics Concern  . 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   Regular exercise- no   5 hours sleep    Lives with a son age 48    No pets         Review of Systems - See HPI.  All other ROS are negative.  Filed Vitals:   09/01/13 1502  BP: 130/88  Pulse: 89  Temp: 98.9 F (37.2 C)  Resp: 22   Physical Exam  Vitals reviewed. Constitutional: She is oriented to person, place, and time and well-developed, well-nourished, and in no distress.  HENT:  Head: Normocephalic and atraumatic.  Right Ear: External ear normal.  Left Ear: External ear normal.  Nose: Nose normal.  Mouth/Throat: Oropharynx is clear and moist. No oropharyngeal exudate.  TM within normal limits bilaterally.  Eyes: Conjunctivae and EOM are normal.  Neck: Neck supple.  Cardiovascular: Normal rate, regular rhythm, normal heart sounds and intact distal  pulses.   Pulmonary/Chest: Effort normal. No respiratory distress. She has wheezes. She has no rales. She exhibits no tenderness.  Lymphadenopathy:    She has no cervical adenopathy.  Neurological: She is alert and oriented to person, place, and time.  Skin: Skin is warm and dry. No rash noted.  Psychiatric: Affect normal.   Recent Results (from the past 2160 hour(s))  POCT INR     Status: None   Collection Time    06/19/13  1:00 PM      Result Value Range   INR 2.6    POCT INR     Status: None   Collection Time    07/31/13  1:27 PM      Result Value Range   INR 4.2     Assessment/Plan: Asthma with bronchitis Nebulizer treatment given in clinic with improvement in lung examination.  Rx Azithromycin.  Rx Tussionex.  Rx Prednisone burst.  Increase fluid intake.  Probiotic.  Rest.  Continue allergy medications.  Albuterol as needed.  Discussed interaction between Azithromycin and Coumadin.  Patient instructed to call coumadin clinic to see if they wish to monitor PT/INR more closely. Patient already has appt with Coumadin Clinic on Friday.

## 2013-09-01 NOTE — Patient Instructions (Signed)
Please take antibiotic as prescribed until all pills are gone.  Take prednisone as prescribed.  Continue Albuterol inhaler as needed.  Increase fluid intake.  Rest.  Take a daily probiotic. Tussionex at bedtime for cough. Place a humidifier in the bedroom.  Please call or return to clinic if symptoms are not improving.

## 2013-09-01 NOTE — Progress Notes (Signed)
Pre visit review using our clinic review tool, if applicable. No additional management support is needed unless otherwise documented below in the visit note/SLS  

## 2013-09-01 NOTE — Telephone Encounter (Signed)
Patient Information:  Caller Name: [place]  Phone: [phone]  Patient: [name] Foster, [name] Foster  Gender: Female  DOB: 1951-03-24  Age: 62 Years  PCP: Shanon Ace (Family Practice)  Office Follow Up:  Does the office need to follow up with this patient?: No  Instructions For The Office: N/A  RN Note:  Albuterol improves productive cough for up to one hour.  It is ordered for use every 6 hours. No appointments available at [place], [facility] or [place].  Has Coumadin test 09/02/11 at 1330; appointment scheduled for 09/01/13 at 1500 with Teresita Madura, PA at [facility] office.    Symptoms  Reason For Call & Symptoms: Worsening chest and nasal congestion with wheezing and body aches.  Reviewed Health History In EMR: Yes  Reviewed Medications In EMR: Yes  Reviewed Allergies In EMR: Yes  Reviewed Surgeries / Procedures: Yes  Date of Onset of Symptoms: 08/27/2013  Treatments Tried: Albuterol MDI every 6 hours, humidifier, nasal spray, flonase, Tylenol for aches, Mucinex  Treatments Tried Worked: No  Guideline(s) Used:  Asthma Attack  Disposition Per Guideline:   See Today in Office  Reason For Disposition Reached:   Patient wants to be seen  Advice Given:  Quick-Relief Asthma Medicine:   Start your quick-relief medicine (e.g., albuterol, salbutamol) at the first sign of any coughing or shortness of breath (don't wait for wheezing). Use your inhaler (2 puffs each time) or nebulizer every 4 hours. Continue the quick-relief medicine until you have not wheezed or coughed for 48 hours.  The best "cough medicine" for an adult with asthma is always the asthma medicine (Note: Don't use cough suppressants, but cough drops may help a tickly cough).  Long-Term-Control Asthma Medicine:  If you are using a controller medicine (e.g., inhaled steroids or cromolyn), continue to take it as directed.  Drinking Liquids:  Try to drink normal amount of liquids (e.g., water). Being adequately hydrated  makes it easier to cough up the sticky lung mucus.  Humidifier:   If the air is dry, use a cool mist humidifier to prevent drying of the upper airway.  Hay Fever  : If you have nasal symptoms from hay fever, it's OK to take antihistamines (Reasons: poor control of allergic rhinitis makes asthma worse whereas antihistamines don't make asthma worse).  Expected Course:  If treatment is started early, most asthma attacks are quickly brought under control. All wheezing should be gone by 5 days.  Call Back If:  Inhaled asthma medicine (nebulizer or inhaler) is needed more often than every 4 hours  You become worse.  Patient Will Follow Care Advice:  YES

## 2013-09-05 ENCOUNTER — Ambulatory Visit (INDEPENDENT_AMBULATORY_CARE_PROVIDER_SITE_OTHER): Payer: PRIVATE HEALTH INSURANCE | Admitting: General Practice

## 2013-09-05 DIAGNOSIS — Z7901 Long term (current) use of anticoagulants: Secondary | ICD-10-CM

## 2013-09-05 DIAGNOSIS — I82409 Acute embolism and thrombosis of unspecified deep veins of unspecified lower extremity: Secondary | ICD-10-CM

## 2013-09-05 LAB — POCT INR: INR: 4.4

## 2013-09-05 NOTE — Progress Notes (Signed)
Pre-visit discussion using our clinic review tool. No additional management support is needed unless otherwise documented below in the visit note.  

## 2013-09-19 ENCOUNTER — Ambulatory Visit (INDEPENDENT_AMBULATORY_CARE_PROVIDER_SITE_OTHER): Payer: PRIVATE HEALTH INSURANCE | Admitting: General Practice

## 2013-09-19 DIAGNOSIS — Z7901 Long term (current) use of anticoagulants: Secondary | ICD-10-CM

## 2013-09-19 DIAGNOSIS — I82409 Acute embolism and thrombosis of unspecified deep veins of unspecified lower extremity: Secondary | ICD-10-CM

## 2013-09-19 LAB — POCT INR: INR: 3.2

## 2013-09-19 NOTE — Progress Notes (Signed)
Pre-visit discussion using our clinic review tool. No additional management support is needed unless otherwise documented below in the visit note.  

## 2013-10-17 ENCOUNTER — Ambulatory Visit (INDEPENDENT_AMBULATORY_CARE_PROVIDER_SITE_OTHER): Payer: PRIVATE HEALTH INSURANCE | Admitting: General Practice

## 2013-10-17 DIAGNOSIS — I82409 Acute embolism and thrombosis of unspecified deep veins of unspecified lower extremity: Secondary | ICD-10-CM

## 2013-10-17 DIAGNOSIS — Z7901 Long term (current) use of anticoagulants: Secondary | ICD-10-CM

## 2013-10-17 LAB — POCT INR: INR: 2.9

## 2013-10-17 NOTE — Progress Notes (Signed)
Pre-visit discussion using our clinic review tool. No additional management support is needed unless otherwise documented below in the visit note.  

## 2013-10-29 ENCOUNTER — Other Ambulatory Visit: Payer: Self-pay | Admitting: General Practice

## 2013-10-29 ENCOUNTER — Other Ambulatory Visit: Payer: Self-pay | Admitting: Internal Medicine

## 2013-10-29 ENCOUNTER — Other Ambulatory Visit: Payer: Self-pay | Admitting: Cardiology

## 2013-10-29 MED ORDER — WARFARIN SODIUM 5 MG PO TABS: ORAL_TABLET | ORAL | Status: DC

## 2013-11-21 ENCOUNTER — Ambulatory Visit (INDEPENDENT_AMBULATORY_CARE_PROVIDER_SITE_OTHER): Payer: PRIVATE HEALTH INSURANCE | Admitting: General Practice

## 2013-11-21 DIAGNOSIS — I82409 Acute embolism and thrombosis of unspecified deep veins of unspecified lower extremity: Secondary | ICD-10-CM

## 2013-11-21 DIAGNOSIS — Z7901 Long term (current) use of anticoagulants: Secondary | ICD-10-CM

## 2013-11-21 DIAGNOSIS — Z5181 Encounter for therapeutic drug level monitoring: Secondary | ICD-10-CM

## 2013-11-21 LAB — POCT INR: INR: 3.1

## 2013-11-21 NOTE — Progress Notes (Signed)
Pre visit review using our clinic review tool, if applicable. No additional management support is needed unless otherwise documented below in the visit note. 

## 2013-12-02 ENCOUNTER — Other Ambulatory Visit: Payer: Self-pay | Admitting: Cardiology

## 2013-12-19 ENCOUNTER — Ambulatory Visit (INDEPENDENT_AMBULATORY_CARE_PROVIDER_SITE_OTHER): Payer: PRIVATE HEALTH INSURANCE | Admitting: General Practice

## 2013-12-19 ENCOUNTER — Ambulatory Visit (INDEPENDENT_AMBULATORY_CARE_PROVIDER_SITE_OTHER): Payer: PRIVATE HEALTH INSURANCE | Admitting: Cardiology

## 2013-12-19 ENCOUNTER — Encounter: Payer: Self-pay | Admitting: Cardiology

## 2013-12-19 ENCOUNTER — Encounter: Payer: Self-pay | Admitting: *Deleted

## 2013-12-19 VITALS — BP 104/76 | HR 57 | Ht 66.0 in | Wt 226.0 lb

## 2013-12-19 DIAGNOSIS — I509 Heart failure, unspecified: Secondary | ICD-10-CM

## 2013-12-19 DIAGNOSIS — I10 Essential (primary) hypertension: Secondary | ICD-10-CM

## 2013-12-19 DIAGNOSIS — E782 Mixed hyperlipidemia: Secondary | ICD-10-CM

## 2013-12-19 DIAGNOSIS — Z7901 Long term (current) use of anticoagulants: Secondary | ICD-10-CM

## 2013-12-19 DIAGNOSIS — I428 Other cardiomyopathies: Secondary | ICD-10-CM

## 2013-12-19 DIAGNOSIS — I82409 Acute embolism and thrombosis of unspecified deep veins of unspecified lower extremity: Secondary | ICD-10-CM

## 2013-12-19 DIAGNOSIS — I351 Nonrheumatic aortic (valve) insufficiency: Secondary | ICD-10-CM

## 2013-12-19 DIAGNOSIS — Z5181 Encounter for therapeutic drug level monitoring: Secondary | ICD-10-CM

## 2013-12-19 DIAGNOSIS — I359 Nonrheumatic aortic valve disorder, unspecified: Secondary | ICD-10-CM

## 2013-12-19 LAB — POCT INR: INR: 2.6

## 2013-12-19 MED ORDER — FUROSEMIDE 40 MG PO TABS: 40.0000 mg | ORAL_TABLET | Freq: Every day | ORAL | Status: DC

## 2013-12-19 NOTE — Assessment & Plan Note (Signed)
Blood pressure controlled.continue present medications. 

## 2013-12-19 NOTE — Assessment & Plan Note (Signed)
Increase Lasix as described under aortic insufficiency.

## 2013-12-19 NOTE — Progress Notes (Signed)
HPI: FU nonischemic cardiomyopathy improved by most recent echocardiogram and a history of aortic insufficiency. Previous catheterization in 2004 showed no obstructive coronary disease. CT of her chest in Nov 2008 showed no thoracic aneurysm. Previous Holter monitor secondary to "dizzy spells" showed PACs and PVCs. Nuclear study April 2014 showed an ejection fraction of 62% and normal perfusion. Patient underwent transesophageal echocardiogram in May of 2014. Her ejection fraction was 50-55%. There was moderate aortic insufficiency and mild mitral regurgitation. There was a linear density associated with the atrial septum of uncertain etiology. Followup echo in August of 2014 showed an ejection fraction of 0000000, grade 1 diastolic dysfunction, mild left atrial enlargement, mild to moderate aortic insufficiency and mild mitral regurgitation. I last saw her in August of 2014. Since then, over the last 3 days she has noticed increased dyspnea on exertion and had orthopnea last evening. Increased pedal edema. She occasionally has nonexertional chest pain.  Current Outpatient Prescriptions  Medication Sig Dispense Refill  . albuterol (PROVENTIL HFA;VENTOLIN HFA) 108 (90 BASE) MCG/ACT inhaler Inhale 2 puffs into the lungs every 6 (six) hours as needed for wheezing.  1 Inhaler  0  . amlodipine-atorvastatin (CADUET) 10-20 MG per tablet Take 1 tablet by mouth daily.  30 tablet  12  . chlorpheniramine-HYDROcodone (TUSSIONEX PENNKINETIC ER) 10-8 MG/5ML LQCR Take 5 mLs by mouth at bedtime as needed for cough.  115 mL  0  . cholecalciferol (VITAMIN D) 1000 UNITS tablet Take 1,000 Units by mouth daily.        . fexofenadine (ALLEGRA) 60 MG tablet Take 1 tablet (60 mg total) by mouth daily.  90 tablet  0  . fluticasone (FLONASE) 50 MCG/ACT nasal spray USE 2 SPRAYS IN EACH NOSTRIL DAILY. PT NEEDS TO SCHEDULE A FOLLOW UP APPT BEFORE NEXT REFILL.  16 g  0  . furosemide (LASIX) 20 MG tablet Take 1 tablet (20 mg  total) by mouth daily.  30 tablet  12  . hydrALAZINE (APRESOLINE) 25 MG tablet Take 1 tablet (25 mg total) by mouth 3 (three) times daily.  90 tablet  11  . metoprolol tartrate (LOPRESSOR) 25 MG tablet TAKE 1 TABLET BY MOUTH TWICE A DAY  60 tablet  0  . potassium chloride SA (K-DUR,KLOR-CON) 20 MEQ tablet Take 1 tablet (20 mEq total) by mouth daily.  30 tablet  6  . ramipril (ALTACE) 10 MG capsule TAKE ONE CAPSULE BY MOUTH TWICE A DAY  60 capsule  0  . warfarin (COUMADIN) 5 MG tablet TAKE AS DIRECTED BY ANTICOAGULATION CLINIC.  35 tablet  1   No current facility-administered medications for this visit.     Past Medical History  Diagnosis Date  . Other primary cardiomyopathies   . Hypertension   . Aortic valve disorders   . Hyperlipidemia   . Personal history of colonic polyps   . Personal history of venous thrombosis and embolism   . Asthma   . Allergic rhinitis   . Long term (current) use of anticoagulants     Past Surgical History  Procedure Laterality Date  . Abdominal hysterectomy    . Rt knee arthoscopic    . Dilation and curettage of uterus      several  . Cardiac catheterization    . Tee without cardioversion N/A 02/21/2013    Procedure: TRANSESOPHAGEAL ECHOCARDIOGRAM (TEE);  Surgeon: Lelon Perla, MD;  Location: [facility] ENDOSCOPY;  Service: Cardiovascular;  Laterality: N/A;    History   Social  History  . Marital Status: Legally Separated    Spouse Name: N/A    Number of Children: N/A  . Years of Education: N/A   Occupational History  . Not on file.   Social History Main Topics  . Smoking status: Former Smoker -- 0.25 packs/day    Quit date: 10/21/2013  . Smokeless tobacco: Not on file  . Alcohol Use: Yes     Comment: rare occasion  . Drug Use: No  . Sexual Activity: Not on file   Other Topics Concern  . 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 Remaining systems are negative.  Physical Exam: Well-developed well-nourished in no acute distress.  Skin is warm and dry.  HEENT is normal.  Neck is supple.  Chest is clear to auscultation with normal expansion.  Cardiovascular exam is regular rate and rhythm. 2/6 systolic murmur left sternal border. Abdominal exam nontender or distended. No masses palpated. Extremities show 1+ edema. neuro grossly intact  ECG sinus rhythm at a rate of 57. Left ventricular hypertrophy. Anterior and inferior T-wave inversion.

## 2013-12-19 NOTE — Assessment & Plan Note (Addendum)
Worsening CHF symptoms. Take additional 40 mg of Lasix today and twice a day tomorrow. Then continue at 40 mg daily thereafter. Check potassium and renal function in one week. Repeat echocardiogram. May need aortic valve replacement in the future. Previous echocardiogram showed mild to moderate aortic insufficiency but prior echocardiograms showed moderate to severe.

## 2013-12-19 NOTE — Patient Instructions (Signed)
Your physician recommends that you schedule a follow-up appointment in: 4-6 [place] 40 MG - TWO TABLETS TWICE TOMORROW THEN   FUROSEMIDE 40 MG ONCE DAILY  Your physician recommends that you return for lab work in: [place] physician has requested that you have an echocardiogram. Echocardiography is a painless test that uses sound waves to create images of your heart. It provides your doctor with information about the size and shape of your heart and how well your heart's chambers and valves are working. This procedure takes approximately one hour. There are no restrictions for this procedure.

## 2013-12-19 NOTE — Assessment & Plan Note (Signed)
Continue Coumadin for recurrent DVT.

## 2013-12-19 NOTE — Assessment & Plan Note (Signed)
Continue ACE inhibitor and beta blocker.LV function improved on most recent echocardiogram. She is having worsening CHF symptoms. Repeat echocardiogram.

## 2013-12-19 NOTE — Progress Notes (Signed)
Pre visit review using our clinic review tool, if applicable. No additional management support is needed unless otherwise documented below in the visit note. 

## 2013-12-19 NOTE — Assessment & Plan Note (Signed)
Continue statin. 

## 2013-12-26 ENCOUNTER — Other Ambulatory Visit: Payer: PRIVATE HEALTH INSURANCE

## 2013-12-26 ENCOUNTER — Other Ambulatory Visit (INDEPENDENT_AMBULATORY_CARE_PROVIDER_SITE_OTHER): Payer: PRIVATE HEALTH INSURANCE

## 2013-12-26 ENCOUNTER — Other Ambulatory Visit: Payer: Self-pay | Admitting: *Deleted

## 2013-12-26 DIAGNOSIS — I10 Essential (primary) hypertension: Secondary | ICD-10-CM

## 2013-12-26 LAB — BASIC METABOLIC PANEL
BUN: 13 mg/dL (ref 6–23)
CALCIUM: 9.2 mg/dL (ref 8.4–10.5)
CO2: 31 meq/L (ref 19–32)
CREATININE: 1.3 mg/dL — AB (ref 0.4–1.2)
Chloride: 104 mEq/L (ref 96–112)
GFR: 54.91 mL/min — ABNORMAL LOW (ref >60.00)
Glucose, Bld: 89 mg/dL (ref 70–99)
Potassium: 4 mEq/L (ref 3.5–5.1)
SODIUM: 142 meq/L (ref 135–145)

## 2014-01-02 ENCOUNTER — Ambulatory Visit (HOSPITAL_COMMUNITY): Payer: PRIVATE HEALTH INSURANCE | Attending: Cardiovascular Disease | Admitting: Cardiology

## 2014-01-02 ENCOUNTER — Encounter: Payer: Self-pay | Admitting: Cardiovascular Disease

## 2014-01-02 DIAGNOSIS — I359 Nonrheumatic aortic valve disorder, unspecified: Secondary | ICD-10-CM

## 2014-01-02 DIAGNOSIS — I351 Nonrheumatic aortic (valve) insufficiency: Secondary | ICD-10-CM

## 2014-01-02 DIAGNOSIS — R0602 Shortness of breath: Secondary | ICD-10-CM

## 2014-01-02 DIAGNOSIS — I509 Heart failure, unspecified: Secondary | ICD-10-CM

## 2014-01-02 DIAGNOSIS — I429 Cardiomyopathy, unspecified: Secondary | ICD-10-CM | POA: Insufficient documentation

## 2014-01-02 NOTE — Progress Notes (Signed)
Echo performed. 

## 2014-01-05 ENCOUNTER — Other Ambulatory Visit: Payer: Self-pay | Admitting: *Deleted

## 2014-01-05 MED ORDER — METOPROLOL TARTRATE 25 MG PO TABS: ORAL_TABLET | ORAL | Status: DC

## 2014-01-05 MED ORDER — RAMIPRIL 10 MG PO CAPS: ORAL_CAPSULE | ORAL | Status: DC

## 2014-01-29 ENCOUNTER — Ambulatory Visit (INDEPENDENT_AMBULATORY_CARE_PROVIDER_SITE_OTHER): Payer: PRIVATE HEALTH INSURANCE | Admitting: *Deleted

## 2014-01-29 ENCOUNTER — Ambulatory Visit (INDEPENDENT_AMBULATORY_CARE_PROVIDER_SITE_OTHER): Payer: PRIVATE HEALTH INSURANCE | Admitting: Cardiology

## 2014-01-29 ENCOUNTER — Encounter: Payer: Self-pay | Admitting: *Deleted

## 2014-01-29 ENCOUNTER — Encounter: Payer: Self-pay | Admitting: Cardiology

## 2014-01-29 VITALS — BP 134/84 | HR 67 | Ht 66.0 in | Wt 225.0 lb

## 2014-01-29 DIAGNOSIS — I359 Nonrheumatic aortic valve disorder, unspecified: Secondary | ICD-10-CM

## 2014-01-29 DIAGNOSIS — I428 Other cardiomyopathies: Secondary | ICD-10-CM

## 2014-01-29 DIAGNOSIS — I10 Essential (primary) hypertension: Secondary | ICD-10-CM

## 2014-01-29 DIAGNOSIS — E782 Mixed hyperlipidemia: Secondary | ICD-10-CM

## 2014-01-29 DIAGNOSIS — F17200 Nicotine dependence, unspecified, uncomplicated: Secondary | ICD-10-CM

## 2014-01-29 DIAGNOSIS — I82409 Acute embolism and thrombosis of unspecified deep veins of unspecified lower extremity: Secondary | ICD-10-CM

## 2014-01-29 DIAGNOSIS — Z7901 Long term (current) use of anticoagulants: Secondary | ICD-10-CM

## 2014-01-29 DIAGNOSIS — Z720 Tobacco use: Secondary | ICD-10-CM

## 2014-01-29 DIAGNOSIS — I351 Nonrheumatic aortic (valve) insufficiency: Secondary | ICD-10-CM

## 2014-01-29 DIAGNOSIS — Z5181 Encounter for therapeutic drug level monitoring: Secondary | ICD-10-CM

## 2014-01-29 LAB — POCT INR: INR: 3.1

## 2014-01-29 NOTE — Patient Instructions (Signed)
Your physician wants you to follow-up in: 6 MONTHS WITH DR CRENSHAW You will receive a reminder letter in the mail two months in advance. If you don't receive a letter, please call our office to schedule the follow-up appointment.  

## 2014-01-29 NOTE — Assessment & Plan Note (Signed)
Patient has discontinued. 

## 2014-01-29 NOTE — Assessment & Plan Note (Signed)
Improved on most recent echocardiogram. Continue ACE inhibitor and beta blocker.

## 2014-01-29 NOTE — Assessment & Plan Note (Signed)
Continue statin. 

## 2014-01-29 NOTE — Progress Notes (Signed)
HPI: FU nonischemic cardiomyopathy improved by most recent echocardiogram and a history of aortic insufficiency. Previous catheterization in 2004 showed no obstructive coronary disease. CT of her chest in Nov 2008 showed no thoracic aneurysm. Previous Holter monitor secondary to "dizzy spells" showed PACs and PVCs. Nuclear study April 2014 showed an ejection fraction of 62% and normal perfusion. Patient underwent transesophageal echocardiogram in May of 2014. Her ejection fraction was 50-55%. There was moderate aortic insufficiency and mild mitral regurgitation. There was a linear density associated with the atrial septum of uncertain etiology. Last echocardiogram in April of 2015 showed normal LV function, mild to moderate aortic and mitral regurgitation, moderate left atrial enlargement. I last saw her in March of 2014. Since then, Her dyspnea has improved. She has mild pedal edema which is also somewhat improved. No chest pain, palpitations or syncope.   Current Outpatient Prescriptions  Medication Sig Dispense Refill  . albuterol (PROVENTIL HFA;VENTOLIN HFA) 108 (90 BASE) MCG/ACT inhaler Inhale 2 puffs into the lungs every 6 (six) hours as needed for wheezing.  1 Inhaler  0  . amlodipine-atorvastatin (CADUET) 10-20 MG per tablet Take 1 tablet by mouth daily.  30 tablet  12  . chlorpheniramine-HYDROcodone (TUSSIONEX PENNKINETIC ER) 10-8 MG/5ML LQCR Take 5 mLs by mouth at bedtime as needed for cough.  115 mL  0  . cholecalciferol (VITAMIN D) 1000 UNITS tablet Take 1,000 Units by mouth daily.        . fexofenadine (ALLEGRA) 60 MG tablet Take 1 tablet (60 mg total) by mouth daily.  90 tablet  0  . fluticasone (FLONASE) 50 MCG/ACT nasal spray USE 2 SPRAYS IN EACH NOSTRIL DAILY. PT NEEDS TO SCHEDULE A FOLLOW UP APPT BEFORE NEXT REFILL.  16 g  0  . furosemide (LASIX) 40 MG tablet Take 1 tablet (40 mg total) by mouth daily.  30 tablet  12  . hydrALAZINE (APRESOLINE) 25 MG tablet Take 1 tablet (25  mg total) by mouth 3 (three) times daily.  90 tablet  11  . metoprolol tartrate (LOPRESSOR) 25 MG tablet TAKE 1 TABLET BY MOUTH TWICE A DAY  60 tablet  3  . potassium chloride SA (K-DUR,KLOR-CON) 20 MEQ tablet Take 1 tablet (20 mEq total) by mouth daily.  30 tablet  6  . ramipril (ALTACE) 10 MG capsule TAKE ONE CAPSULE BY MOUTH TWICE A DAY  60 capsule  3  . warfarin (COUMADIN) 5 MG tablet TAKE AS DIRECTED BY ANTICOAGULATION CLINIC.  35 tablet  1   No current facility-administered medications for this visit.     Past Medical History  Diagnosis Date  . Other primary cardiomyopathies   . Hypertension   . Aortic valve disorders   . Hyperlipidemia   . Personal history of colonic polyps   . Personal history of venous thrombosis and embolism   . Asthma   . Allergic rhinitis   . Long term (current) use of anticoagulants     Past Surgical History  Procedure Laterality Date  . Abdominal hysterectomy    . Rt knee arthoscopic    . Dilation and curettage of uterus      several  . Cardiac catheterization    . Tee without cardioversion N/A 02/21/2013    Procedure: TRANSESOPHAGEAL ECHOCARDIOGRAM (TEE);  Surgeon: Lelon Perla, MD;  Location: [facility] ENDOSCOPY;  Service: Cardiovascular;  Laterality: N/A;    History   Social History  . Marital Status: Legally Separated    Spouse Name: N/A  Number of Children: N/A  . Years of Education: N/A   Occupational History  . Not on file.   Social History Main Topics  . Smoking status: Former Smoker -- 0.25 packs/day    Quit date: 10/21/2013  . Smokeless tobacco: Not on file  . Alcohol Use: Yes     Comment: rare occasion  . Drug Use: No  . Sexual Activity: Not on file   Other Topics Concern  . 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 Remaining systems are negative.  Physical Exam: Well-developed obese in no acute distress.  Skin is warm and dry.  HEENT is normal.  Neck is supple.  Chest is clear to auscultation with normal expansion.  Cardiovascular exam is regular rate and rhythm. 2/6 systolic murmur. Abdominal exam nontender or distended. No masses palpated. Extremities show trace edema. neuro grossly intact

## 2014-01-29 NOTE — Assessment & Plan Note (Signed)
Mild to moderate on most recent echo. LV function normal. Symptoms have improved with increased diuretics. Continue Lasix 40 mg daily. Take an additional 40 mg daily as needed for increasing edema or dyspnea.

## 2014-01-29 NOTE — Assessment & Plan Note (Signed)
Blood pressure controlled. Continue present medications. 

## 2014-01-29 NOTE — Assessment & Plan Note (Signed)
Patient continues on Coumadin for history of recurrent DVT.

## 2014-02-07 ENCOUNTER — Other Ambulatory Visit: Payer: Self-pay | Admitting: Internal Medicine

## 2014-02-09 NOTE — Telephone Encounter (Signed)
[facility] is out of the office.  Can you refill?

## 2014-03-13 ENCOUNTER — Ambulatory Visit (INDEPENDENT_AMBULATORY_CARE_PROVIDER_SITE_OTHER): Payer: PRIVATE HEALTH INSURANCE | Admitting: General Practice

## 2014-03-13 DIAGNOSIS — Z7901 Long term (current) use of anticoagulants: Secondary | ICD-10-CM

## 2014-03-13 DIAGNOSIS — Z5181 Encounter for therapeutic drug level monitoring: Secondary | ICD-10-CM

## 2014-03-13 DIAGNOSIS — I82409 Acute embolism and thrombosis of unspecified deep veins of unspecified lower extremity: Secondary | ICD-10-CM

## 2014-03-13 LAB — POCT INR: INR: 3

## 2014-03-13 NOTE — Progress Notes (Signed)
Pre visit review using our clinic review tool, if applicable. No additional management support is needed unless otherwise documented below in the visit note. 

## 2014-03-20 ENCOUNTER — Other Ambulatory Visit: Payer: Self-pay

## 2014-03-20 MED ORDER — AMLODIPINE-ATORVASTATIN 10-20 MG PO TABS: 1.0000 | ORAL_TABLET | Freq: Every day | ORAL | Status: DC

## 2014-04-24 ENCOUNTER — Other Ambulatory Visit: Payer: Self-pay | Admitting: General Practice

## 2014-04-24 ENCOUNTER — Ambulatory Visit (INDEPENDENT_AMBULATORY_CARE_PROVIDER_SITE_OTHER): Payer: PRIVATE HEALTH INSURANCE | Admitting: General Practice

## 2014-04-24 DIAGNOSIS — Z5181 Encounter for therapeutic drug level monitoring: Secondary | ICD-10-CM

## 2014-04-24 DIAGNOSIS — I82409 Acute embolism and thrombosis of unspecified deep veins of unspecified lower extremity: Secondary | ICD-10-CM

## 2014-04-24 DIAGNOSIS — Z7901 Long term (current) use of anticoagulants: Secondary | ICD-10-CM

## 2014-04-24 LAB — POCT INR: INR: 3.6

## 2014-04-24 MED ORDER — WARFARIN SODIUM 5 MG PO TABS: ORAL_TABLET | ORAL | Status: DC

## 2014-04-24 NOTE — Progress Notes (Signed)
Pre visit review using our clinic review tool, if applicable. No additional management support is needed unless otherwise documented below in the visit note. 

## 2014-05-22 ENCOUNTER — Other Ambulatory Visit: Payer: Self-pay | Admitting: *Deleted

## 2014-05-22 MED ORDER — METOPROLOL TARTRATE 25 MG PO TABS: ORAL_TABLET | ORAL | Status: DC

## 2014-05-22 MED ORDER — RAMIPRIL 10 MG PO CAPS: ORAL_CAPSULE | ORAL | Status: DC

## 2014-06-05 ENCOUNTER — Ambulatory Visit (INDEPENDENT_AMBULATORY_CARE_PROVIDER_SITE_OTHER): Payer: PRIVATE HEALTH INSURANCE | Admitting: *Deleted

## 2014-06-05 DIAGNOSIS — I82409 Acute embolism and thrombosis of unspecified deep veins of unspecified lower extremity: Secondary | ICD-10-CM

## 2014-06-05 DIAGNOSIS — Z5181 Encounter for therapeutic drug level monitoring: Secondary | ICD-10-CM

## 2014-06-05 DIAGNOSIS — Z7901 Long term (current) use of anticoagulants: Secondary | ICD-10-CM

## 2014-06-05 LAB — POCT INR: INR: 2.2

## 2014-06-25 ENCOUNTER — Other Ambulatory Visit: Payer: Self-pay

## 2014-06-25 DIAGNOSIS — Z720 Tobacco use: Secondary | ICD-10-CM

## 2014-06-25 DIAGNOSIS — Z7901 Long term (current) use of anticoagulants: Secondary | ICD-10-CM

## 2014-06-25 DIAGNOSIS — I509 Heart failure, unspecified: Secondary | ICD-10-CM

## 2014-06-25 DIAGNOSIS — I351 Nonrheumatic aortic (valve) insufficiency: Secondary | ICD-10-CM

## 2014-06-25 DIAGNOSIS — I428 Other cardiomyopathies: Secondary | ICD-10-CM

## 2014-06-25 DIAGNOSIS — E782 Mixed hyperlipidemia: Secondary | ICD-10-CM

## 2014-06-25 DIAGNOSIS — I10 Essential (primary) hypertension: Secondary | ICD-10-CM

## 2014-06-25 MED ORDER — HYDRALAZINE HCL 25 MG PO TABS: 25.0000 mg | ORAL_TABLET | Freq: Three times a day (TID) | ORAL | Status: DC

## 2014-07-15 ENCOUNTER — Ambulatory Visit (INDEPENDENT_AMBULATORY_CARE_PROVIDER_SITE_OTHER): Payer: PRIVATE HEALTH INSURANCE | Admitting: *Deleted

## 2014-07-15 DIAGNOSIS — Z5181 Encounter for therapeutic drug level monitoring: Secondary | ICD-10-CM

## 2014-07-15 LAB — POCT INR: INR: 2.7

## 2014-07-17 ENCOUNTER — Other Ambulatory Visit: Payer: Self-pay

## 2014-07-30 ENCOUNTER — Telehealth: Payer: Self-pay | Admitting: Cardiology

## 2014-07-30 ENCOUNTER — Other Ambulatory Visit: Payer: Self-pay

## 2014-07-30 MED ORDER — RAMIPRIL 10 MG PO CAPS: ORAL_CAPSULE | ORAL | Status: DC

## 2014-07-30 MED ORDER — HYDRALAZINE HCL 25 MG PO TABS: 25.0000 mg | ORAL_TABLET | Freq: Three times a day (TID) | ORAL | Status: DC

## 2014-07-30 MED ORDER — METOPROLOL TARTRATE 25 MG PO TABS: ORAL_TABLET | ORAL | Status: DC

## 2014-07-30 NOTE — Telephone Encounter (Signed)
error 

## 2014-08-07 NOTE — Progress Notes (Signed)
HPI: FU nonischemic cardiomyopathy improved by most recent echocardiogram and a history of aortic insufficiency. Previous catheterization in 2004 showed no obstructive coronary disease. CT of her chest in Nov 2008 showed no thoracic aneurysm. Previous Holter monitor secondary to "dizzy spells" showed PACs and PVCs. Nuclear study April 2014 showed an ejection fraction of 62% and normal perfusion. Patient underwent transesophageal echocardiogram in May of 2014. Her ejection fraction was 50-55%. There was moderate aortic insufficiency and mild mitral regurgitation. There was a linear density associated with the atrial septum of uncertain etiology. Last echocardiogram in April of 2015 showed normal LV function, mild to moderate aortic and mitral regurgitation, moderate left atrial enlargement. Since I last saw her, the patient has dyspnea with more extreme activities but not with routine activities. It is relieved with rest. It is not associated with chest pain. There is no orthopnea, PND or pedal edema. There is no syncope or palpitations. There is no exertional chest pain.   Current Outpatient Prescriptions  Medication Sig Dispense Refill  . albuterol (PROVENTIL HFA;VENTOLIN HFA) 108 (90 BASE) MCG/ACT inhaler Inhale 2 puffs into the lungs every 6 (six) hours as needed for wheezing. 1 Inhaler 0  . amlodipine-atorvastatin (CADUET) 10-20 MG per tablet Take 1 tablet by mouth daily. 30 tablet 6  . chlorpheniramine-HYDROcodone (TUSSIONEX PENNKINETIC ER) 10-8 MG/5ML LQCR Take 5 mLs by mouth at bedtime as needed for cough. 115 mL 0  . cholecalciferol (VITAMIN D) 1000 UNITS tablet Take 1,000 Units by mouth daily.      . fexofenadine (ALLEGRA) 60 MG tablet Take 1 tablet (60 mg total) by mouth daily. 90 tablet 0  . fluticasone (FLONASE) 50 MCG/ACT nasal spray USE 2 SPRAYS IN EACH NOSTRIL DAILY. PT NEEDS TO SCHEDULE A FOLLOW UP APPT BEFORE NEXT REFILL. 16 g 0  . furosemide (LASIX) 40 MG tablet Take 1 tablet  (40 mg total) by mouth daily. 30 tablet 12  . hydrALAZINE (APRESOLINE) 25 MG tablet Take 1 tablet (25 mg total) by mouth 3 (three) times daily. 90 tablet 3  . metoprolol tartrate (LOPRESSOR) 25 MG tablet TAKE 1 TABLET BY MOUTH TWICE A DAY 60 tablet 1  . potassium chloride SA (K-DUR,KLOR-CON) 20 MEQ tablet Take 1 tablet (20 mEq total) by mouth daily. 30 tablet 6  . ramipril (ALTACE) 10 MG capsule TAKE ONE CAPSULE BY MOUTH TWICE A DAY 60 capsule 1  . warfarin (COUMADIN) 5 MG tablet Take as directed by anticoagulation clinic 35 tablet 3   No current facility-administered medications for this visit.     Past Medical History  Diagnosis Date  . Other primary cardiomyopathies   . Hypertension   . Aortic valve disorders   . Hyperlipidemia   . Personal history of colonic polyps   . Personal history of venous thrombosis and embolism   . Asthma   . Allergic rhinitis   . Long term (current) use of anticoagulants     Past Surgical History  Procedure Laterality Date  . Abdominal hysterectomy    . Rt knee arthoscopic    . Dilation and curettage of uterus      several  . Cardiac catheterization    . Tee without cardioversion N/A 02/21/2013    Procedure: TRANSESOPHAGEAL ECHOCARDIOGRAM (TEE);  Surgeon: Lelon Perla, MD;  Location: [facility] ENDOSCOPY;  Service: Cardiovascular;  Laterality: N/A;    History   Social History  . Marital Status: Legally Separated    Spouse Name: N/A    Number  of Children: N/A  . Years of Education: N/A   Occupational History  . Not on file.   Social History Main Topics  . Smoking status: Former Smoker -- 0.25 packs/day    Quit date: 10/21/2013  . Smokeless tobacco: Not on file  . Alcohol Use: Yes     Comment: rare occasion  . Drug Use: No  . Sexual Activity: Not on file   Other Topics Concern  . 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    Divorced   Regular exercise- no   5 hours sleep    Lives with a  son age 43    No pets          ROS: no fevers or chills, productive cough, hemoptysis, dysphasia, odynophagia, melena, hematochezia, dysuria, hematuria, rash, seizure activity, orthopnea, PND, pedal edema, claudication. Remaining systems are negative.  Physical Exam: Well-developed well-nourished in no acute distress.  Skin is warm and dry.  HEENT is normal.  Neck is supple.  Chest is clear to auscultation with normal expansion.  Cardiovascular exam is regular rate and rhythm. 2/6 systolic murmur left sternal border. No diastolic murmur appreciated. Abdominal exam nontender or distended. No masses palpated. Extremities show no edema. neuro grossly intact  ECG Sinus rhythm at a rate of 64. Left ventricular hypertrophy. Nonspecific T-wave changes. No change compared to 12/19/2013.

## 2014-08-10 ENCOUNTER — Ambulatory Visit (INDEPENDENT_AMBULATORY_CARE_PROVIDER_SITE_OTHER): Payer: PRIVATE HEALTH INSURANCE | Admitting: Cardiology

## 2014-08-10 ENCOUNTER — Encounter: Payer: Self-pay | Admitting: Cardiology

## 2014-08-10 VITALS — BP 120/84 | HR 64 | Ht 66.0 in | Wt 220.4 lb

## 2014-08-10 DIAGNOSIS — I82409 Acute embolism and thrombosis of unspecified deep veins of unspecified lower extremity: Secondary | ICD-10-CM

## 2014-08-10 DIAGNOSIS — I351 Nonrheumatic aortic (valve) insufficiency: Secondary | ICD-10-CM

## 2014-08-10 DIAGNOSIS — I5032 Chronic diastolic (congestive) heart failure: Secondary | ICD-10-CM

## 2014-08-10 DIAGNOSIS — I10 Essential (primary) hypertension: Secondary | ICD-10-CM

## 2014-08-10 DIAGNOSIS — E782 Mixed hyperlipidemia: Secondary | ICD-10-CM

## 2014-08-10 NOTE — Assessment & Plan Note (Signed)
Patient on chronic Coumadin for history of recurrent DVTs.

## 2014-08-10 NOTE — Assessment & Plan Note (Signed)
Continue present dose of Lasix. Check potassium and renal function. 

## 2014-08-10 NOTE — Assessment & Plan Note (Signed)
Continue statin. 

## 2014-08-10 NOTE — Patient Instructions (Signed)
Your physician wants you to follow-up in: 6 MONTHS WITH DR CRENSHAW You will receive a reminder letter in the mail two months in advance. If you don't receive a letter, please call our office to schedule the follow-up appointment.   Your physician recommends that you HAVE LAB WORK TODAY 

## 2014-08-10 NOTE — Assessment & Plan Note (Signed)
Continue beta blocker and ACE inhibitor. 

## 2014-08-10 NOTE — Assessment & Plan Note (Signed)
Plan repeat echocardiogram when she returns in 1 year.

## 2014-08-10 NOTE — Assessment & Plan Note (Signed)
Blood pressure controlled. Continue present medications. Check potassium and renal function. 

## 2014-08-11 LAB — BASIC METABOLIC PANEL WITH GFR
BUN: 12 mg/dL (ref 6–23)
CALCIUM: 9.2 mg/dL (ref 8.4–10.5)
CHLORIDE: 104 meq/L (ref 96–112)
CO2: 26 meq/L (ref 19–32)
Creat: 1.16 mg/dL — ABNORMAL HIGH (ref 0.50–1.10)
GFR, Est African American: 58 mL/min — ABNORMAL LOW
GFR, Est Non African American: 51 mL/min — ABNORMAL LOW
Glucose, Bld: 86 mg/dL (ref 70–99)
Potassium: 3.6 mEq/L (ref 3.5–5.3)
SODIUM: 142 meq/L (ref 135–145)

## 2014-08-26 ENCOUNTER — Ambulatory Visit (INDEPENDENT_AMBULATORY_CARE_PROVIDER_SITE_OTHER): Payer: PRIVATE HEALTH INSURANCE

## 2014-08-26 DIAGNOSIS — Z5181 Encounter for therapeutic drug level monitoring: Secondary | ICD-10-CM

## 2014-08-26 DIAGNOSIS — I82409 Acute embolism and thrombosis of unspecified deep veins of unspecified lower extremity: Secondary | ICD-10-CM

## 2014-08-26 LAB — POCT INR: INR: 2.3

## 2014-09-21 ENCOUNTER — Ambulatory Visit (INDEPENDENT_AMBULATORY_CARE_PROVIDER_SITE_OTHER): Payer: PRIVATE HEALTH INSURANCE | Admitting: Family Medicine

## 2014-09-21 ENCOUNTER — Encounter: Payer: Self-pay | Admitting: Family Medicine

## 2014-09-21 ENCOUNTER — Other Ambulatory Visit: Payer: Self-pay | Admitting: Internal Medicine

## 2014-09-21 VITALS — BP 142/84 | HR 69 | Temp 98.3°F | Ht 66.0 in | Wt 220.2 lb

## 2014-09-21 DIAGNOSIS — J4521 Mild intermittent asthma with (acute) exacerbation: Secondary | ICD-10-CM

## 2014-09-21 DIAGNOSIS — J0100 Acute maxillary sinusitis, unspecified: Secondary | ICD-10-CM

## 2014-09-21 MED ORDER — DOXYCYCLINE HYCLATE 100 MG PO TABS: 100.0000 mg | ORAL_TABLET | Freq: Two times a day (BID) | ORAL | Status: DC

## 2014-09-21 MED ORDER — HYDROCOD POLST-CHLORPHEN POLST 10-8 MG/5ML PO LQCR: 5.0000 mL | Freq: Every evening | ORAL | Status: DC | PRN

## 2014-09-21 NOTE — Progress Notes (Signed)
Pre visit review using our clinic review tool, if applicable. No additional management support is needed unless otherwise documented below in the visit note. 

## 2014-09-21 NOTE — Patient Instructions (Signed)
INSTRUCTIONS FOR UPPER RESPIRATORY INFECTION:  -plenty of rest and fluids  -please notify your coumadin clinic that you are taking the antibiotic (doxycycline)  -As we discussed, we have prescribed a new medication for you at this appointment. We discussed the common and serious potential adverse effects of this medication and you can review these and more with the pharmacist when you pick up your medication.  Please follow the instructions for use carefully and notify us immediately if you have any problems taking this medication.  -nasal saline wash 2-3 times daily (use prepackaged nasal saline or bottled/distilled water if making your own)   -clean nose with nasal saline before using the nasal steroid or sinex  -can afrin sinex nasal spray for drainage and nasal congestion - but do NOT use longer then 3-4 days  -can use tylenol or ibuprofen as directed for aches and sorethroat  -in the winter time, using a humidifier at night is helpful (please follow cleaning instructions)  -if you are taking a cough medication - use only as directed, may also try a teaspoon of honey to coat the throat and throat lozenges  -for sore throat, salt water gargles can help  -follow up if you have fevers, facial pain, tooth pain, difficulty breathing or are worsening or not getting better in 5-7 days

## 2014-09-21 NOTE — Progress Notes (Signed)
HPI:  -started: 1 week ago -symptoms:nasal congestion, sore throat, cough, PND, sneezing, R maxillary sinus pain the last few days, wheezing, thick yellow mucus -denies:fever, SOB, NVD, tooth pain -has tried: tylenol, musinex, cough medication from home - has needed alb for wheezing - this has resolved -sick contacts/travel/risks: denies flu exposure, tick exposure or or Ebola risks -Hx of: allergies and asthma  ROS: See pertinent positives and negatives per HPI.  Past Medical History  Diagnosis Date  . Other primary cardiomyopathies   . Hypertension   . Aortic valve disorders   . Hyperlipidemia   . Personal history of colonic polyps   . Personal history of venous thrombosis and embolism   . Asthma   . Allergic rhinitis   . Long term (current) use of anticoagulants     Past Surgical History  Procedure Laterality Date  . Abdominal hysterectomy    . Rt knee arthoscopic    . Dilation and curettage of uterus      several  . Cardiac catheterization    . Tee without cardioversion N/A 02/21/2013    Procedure: TRANSESOPHAGEAL ECHOCARDIOGRAM (TEE);  Surgeon: Lelon Perla, MD;  Location: [facility] ENDOSCOPY;  Service: Cardiovascular;  Laterality: N/A;    Family History  Problem Relation Age of Onset  . Alcohol abuse    . Colon cancer    . Depression    . Hyperlipidemia    . Hypertension    . Kidney disease    . Uterine cancer    . Breast cancer      cousin  . Stroke      grandmother    History   Social History  . Marital Status: Legally Separated    Spouse Name: N/A    Number of Children: N/A  . Years of Education: N/A   Social History Main Topics  . Smoking status: Former Smoker -- 0.25 packs/day    Quit date: 10/21/2013  . Smokeless tobacco: None  . Alcohol Use: Yes     Comment: rare occasion  . Drug Use: No  . Sexual Activity: None   Other Topics Concern  . 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   Regular exercise- no   5 hours sleep    Lives with a son age 21    No pets          Current outpatient prescriptions: albuterol (PROVENTIL HFA;VENTOLIN HFA) 108 (90 BASE) MCG/ACT inhaler, Inhale 2 puffs into the lungs every 6 (six) hours as needed for wheezing., Disp: 1 Inhaler, Rfl: 0;  amlodipine-atorvastatin (CADUET) 10-20 MG per tablet, Take 1 tablet by mouth daily., Disp: 30 tablet, Rfl: 6 chlorpheniramine-HYDROcodone (TUSSIONEX PENNKINETIC ER) 10-8 MG/5ML LQCR, Take 5 mLs by mouth at bedtime as needed for cough., Disp: 115 mL, Rfl: 0;  cholecalciferol (VITAMIN D) 1000 UNITS tablet, Take 1,000 Units by mouth daily.  , Disp: , Rfl: ;  fexofenadine (ALLEGRA) 60 MG tablet, Take 1 tablet (60 mg total) by mouth daily., Disp: 90 tablet, Rfl: 0 fluticasone (FLONASE) 50 MCG/ACT nasal spray, USE 2 SPRAYS IN EACH NOSTRIL DAILY. PT NEEDS TO SCHEDULE A FOLLOW UP APPT BEFORE NEXT REFILL., Disp: 16 g, Rfl: 0;  furosemide (LASIX) 40 MG tablet, Take 1 tablet (40 mg total) by mouth daily., Disp: 30 tablet, Rfl: 12;  hydrALAZINE (APRESOLINE) 25 MG tablet, Take 1 tablet (25 mg total) by mouth 3 (three) times daily., Disp: 90 tablet, Rfl:  3 metoprolol tartrate (LOPRESSOR) 25 MG tablet, TAKE 1 TABLET BY MOUTH TWICE A DAY, Disp: 60 tablet, Rfl: 1;  potassium chloride SA (K-DUR,KLOR-CON) 20 MEQ tablet, Take 1 tablet (20 mEq total) by mouth daily., Disp: 30 tablet, Rfl: 6;  ramipril (ALTACE) 10 MG capsule, TAKE ONE CAPSULE BY MOUTH TWICE A DAY, Disp: 60 capsule, Rfl: 1;  warfarin (COUMADIN) 5 MG tablet, TAKE AS DIRECTED BY ANTICOAGULATION CLINIC, Disp: 90 tablet, Rfl: 0 doxycycline (VIBRA-TABS) 100 MG tablet, Take 1 tablet (100 mg total) by mouth 2 (two) times daily., Disp: 20 tablet, Rfl: 0  EXAM:  Filed Vitals:   09/21/14 1402  BP: 142/84  Pulse: 69  Temp: 98.3 F (36.8 C)    Body mass index is 35.56 kg/(m^2).  GENERAL: vitals reviewed and listed above, alert, oriented, appears well hydrated and in no acute  distress  HEENT: atraumatic, conjunttiva clear, no obvious abnormalities on inspection of external nose and ears, normal appearance of ear canals and TMs, thick white nasal congestion, mild post oropharyngeal erythema with PND, no tonsillar edema or exudate, no sinus TTP  NECK: no obvious masses on inspection  LUNGS: clear to auscultation bilaterally, no wheezes, rales or rhonchi, good air movement  CV: HRRR, no peripheral edema  MS: moves all extremities without noticeable abnormality  PSYCH: pleasant and cooperative, no obvious depression or anxiety  ASSESSMENT AND PLAN:  Discussed the following assessment and plan:  Asthma with bronchitis, mild intermittent, with acute exacerbation - Plan: chlorpheniramine-HYDROcodone (TUSSIONEX PENNKINETIC ER) 10-8 MG/5ML LQCR  Acute maxillary sinusitis, recurrence not specified - Plan: doxycycline (VIBRA-TABS) 100 MG tablet  -We discussed potential etiologies, treatment side effects, interactions (advised she notify her coumadin clinic of antibiotic use when starts) likely course, antibiotic misuse, transmission, and signs of developing a serious illness. Follow up imediatley if any recurring wheezing, sob not responding to albuterol. -of course, we advised to return or notify a doctor immediately if symptoms worsen or persist or new concerns arise.    There are no Patient Instructions on file for this visit.   Colin Benton R.

## 2014-09-22 ENCOUNTER — Other Ambulatory Visit: Payer: Self-pay | Admitting: *Deleted

## 2014-09-22 MED ORDER — METOPROLOL TARTRATE 25 MG PO TABS: ORAL_TABLET | ORAL | Status: DC

## 2014-09-22 MED ORDER — RAMIPRIL 10 MG PO CAPS: ORAL_CAPSULE | ORAL | Status: DC

## 2014-09-23 ENCOUNTER — Telehealth: Payer: Self-pay | Admitting: Internal Medicine

## 2014-09-23 NOTE — Telephone Encounter (Signed)
Pt saw dr Maudie Mercury on 09/21/14 and needs a work note from 12/21 thru 12/22. Pt return to work on 09/23/14. Fax to pt at [phone]. Pt needs work note ASAP

## 2014-09-24 ENCOUNTER — Encounter: Payer: Self-pay | Admitting: *Deleted

## 2014-09-24 NOTE — Telephone Encounter (Signed)
Ok to fax letter.

## 2014-09-24 NOTE — Telephone Encounter (Signed)
I left a message at the pts home and cell number to return my call (question as to whom the fax should go to).

## 2014-09-24 NOTE — Telephone Encounter (Signed)
Note completed and faxed to the pts attention at [phone] and she is aware of this.

## 2014-10-07 ENCOUNTER — Ambulatory Visit (INDEPENDENT_AMBULATORY_CARE_PROVIDER_SITE_OTHER): Payer: PRIVATE HEALTH INSURANCE | Admitting: Family Medicine

## 2014-10-07 DIAGNOSIS — I82409 Acute embolism and thrombosis of unspecified deep veins of unspecified lower extremity: Secondary | ICD-10-CM

## 2014-10-07 DIAGNOSIS — Z5181 Encounter for therapeutic drug level monitoring: Secondary | ICD-10-CM

## 2014-10-07 LAB — POCT INR: INR: 2.1

## 2014-11-18 ENCOUNTER — Telehealth: Payer: Self-pay | Admitting: Internal Medicine

## 2014-11-18 NOTE — Telephone Encounter (Signed)
Error

## 2014-11-25 ENCOUNTER — Ambulatory Visit (INDEPENDENT_AMBULATORY_CARE_PROVIDER_SITE_OTHER): Payer: PRIVATE HEALTH INSURANCE | Admitting: General Practice

## 2014-11-25 DIAGNOSIS — Z5181 Encounter for therapeutic drug level monitoring: Secondary | ICD-10-CM

## 2014-11-25 LAB — POCT INR: INR: 2.3

## 2014-11-25 NOTE — Progress Notes (Signed)
Agree with plan 

## 2014-11-25 NOTE — Progress Notes (Signed)
Pre visit review using our clinic review tool, if applicable. No additional management support is needed unless otherwise documented below in the visit note. 

## 2014-11-27 ENCOUNTER — Other Ambulatory Visit: Payer: Self-pay

## 2014-11-27 MED ORDER — AMLODIPINE-ATORVASTATIN 10-20 MG PO TABS: 1.0000 | ORAL_TABLET | Freq: Every day | ORAL | Status: DC

## 2014-11-27 MED ORDER — HYDRALAZINE HCL 25 MG PO TABS: 25.0000 mg | ORAL_TABLET | Freq: Three times a day (TID) | ORAL | Status: DC

## 2015-01-06 ENCOUNTER — Other Ambulatory Visit: Payer: Self-pay | Admitting: General Practice

## 2015-01-06 ENCOUNTER — Ambulatory Visit (INDEPENDENT_AMBULATORY_CARE_PROVIDER_SITE_OTHER): Payer: PRIVATE HEALTH INSURANCE | Admitting: General Practice

## 2015-01-06 ENCOUNTER — Other Ambulatory Visit: Payer: Self-pay | Admitting: *Deleted

## 2015-01-06 DIAGNOSIS — Z5181 Encounter for therapeutic drug level monitoring: Secondary | ICD-10-CM | POA: Diagnosis not present

## 2015-01-06 LAB — POCT INR: INR: 2.7

## 2015-01-06 MED ORDER — FUROSEMIDE 40 MG PO TABS: 40.0000 mg | ORAL_TABLET | Freq: Every day | ORAL | Status: DC

## 2015-01-06 MED ORDER — WARFARIN SODIUM 5 MG PO TABS: ORAL_TABLET | ORAL | Status: DC

## 2015-01-06 NOTE — Progress Notes (Signed)
Agree with plan 

## 2015-01-06 NOTE — Progress Notes (Signed)
Pre visit review using our clinic review tool, if applicable. No additional management support is needed unless otherwise documented below in the visit note. 

## 2015-02-02 ENCOUNTER — Other Ambulatory Visit: Payer: Self-pay | Admitting: *Deleted

## 2015-02-02 MED ORDER — HYDRALAZINE HCL 25 MG PO TABS: 25.0000 mg | ORAL_TABLET | Freq: Three times a day (TID) | ORAL | Status: DC

## 2015-02-17 ENCOUNTER — Ambulatory Visit (INDEPENDENT_AMBULATORY_CARE_PROVIDER_SITE_OTHER): Payer: PRIVATE HEALTH INSURANCE | Admitting: General Practice

## 2015-02-17 DIAGNOSIS — I82409 Acute embolism and thrombosis of unspecified deep veins of unspecified lower extremity: Secondary | ICD-10-CM | POA: Diagnosis not present

## 2015-02-17 DIAGNOSIS — Z5181 Encounter for therapeutic drug level monitoring: Secondary | ICD-10-CM

## 2015-02-17 LAB — POCT INR: INR: 2.1

## 2015-02-17 NOTE — Progress Notes (Signed)
Pre visit review using our clinic review tool, if applicable. No additional management support is needed unless otherwise documented below in the visit note. 

## 2015-02-17 NOTE — Progress Notes (Signed)
Agree with plan 

## 2015-02-22 NOTE — Progress Notes (Signed)
HPI: FU nonischemic cardiomyopathy improved by most recent echocardiogram and a history of aortic insufficiency. Previous catheterization in 2004 showed no obstructive coronary disease. CT of her chest in Nov 2008 showed no thoracic aneurysm. Previous Holter monitor secondary to "dizzy spells" showed PACs and PVCs. Nuclear study April 2014 showed an ejection fraction of 62% and normal perfusion. Patient underwent transesophageal echocardiogram in May of 2014. Her ejection fraction was 50-55%. There was moderate aortic insufficiency and mild mitral regurgitation. There was a linear density associated with the atrial septum of uncertain etiology. Last echocardiogram in April of 2015 showed normal LV function, mild to moderate aortic and mitral regurgitation, moderate left atrial enlargement. Since I last saw her, she has some dyspnea with more moderate activities. No orthopnea or PND. Intermittent pedal edema. Occasional chest pain for 1-2 minutes not related to activities.   Current Outpatient Prescriptions  Medication Sig Dispense Refill  . albuterol (PROVENTIL HFA;VENTOLIN HFA) 108 (90 BASE) MCG/ACT inhaler Inhale 2 puffs into the lungs every 6 (six) hours as needed for wheezing. 1 Inhaler 0  . amlodipine-atorvastatin (CADUET) 10-20 MG per tablet Take 1 tablet by mouth daily. 30 tablet 2  . chlorpheniramine-HYDROcodone (TUSSIONEX PENNKINETIC ER) 10-8 MG/5ML LQCR Take 5 mLs by mouth at bedtime as needed for cough. 115 mL 0  . cholecalciferol (VITAMIN D) 1000 UNITS tablet Take 1,000 Units by mouth daily.      . fexofenadine (ALLEGRA) 60 MG tablet Take 1 tablet (60 mg total) by mouth daily. 90 tablet 0  . fluticasone (FLONASE) 50 MCG/ACT nasal spray USE 2 SPRAYS IN EACH NOSTRIL DAILY. PT NEEDS TO SCHEDULE A FOLLOW UP APPT BEFORE NEXT REFILL. 16 g 0  . furosemide (LASIX) 40 MG tablet Take 1 tablet (40 mg total) by mouth daily. 30 tablet 1  . hydrALAZINE (APRESOLINE) 25 MG tablet Take 1 tablet (25  mg total) by mouth 3 (three) times daily. 90 tablet 0  . metoprolol tartrate (LOPRESSOR) 25 MG tablet TAKE 1 TABLET BY MOUTH TWICE A DAY 60 tablet 5  . potassium chloride SA (K-DUR,KLOR-CON) 20 MEQ tablet Take 1 tablet (20 mEq total) by mouth daily. 30 tablet 6  . ramipril (ALTACE) 10 MG capsule TAKE ONE CAPSULE BY MOUTH TWICE A DAY 60 capsule 5  . warfarin (COUMADIN) 5 MG tablet Take as directed by anticoagulation clinic 105 tablet 1   No current facility-administered medications for this visit.     Past Medical History  Diagnosis Date  . Other primary cardiomyopathies   . Hypertension   . Aortic valve disorders   . Hyperlipidemia   . Personal history of colonic polyps   . Personal history of venous thrombosis and embolism   . Asthma   . Allergic rhinitis   . Long term (current) use of anticoagulants     Past Surgical History  Procedure Laterality Date  . Abdominal hysterectomy    . Rt knee arthoscopic    . Dilation and curettage of uterus      several  . Cardiac catheterization    . Tee without cardioversion N/A 02/21/2013    Procedure: TRANSESOPHAGEAL ECHOCARDIOGRAM (TEE);  Surgeon: Lelon Perla, MD;  Location: [facility] ENDOSCOPY;  Service: Cardiovascular;  Laterality: N/A;    History   Social History  . Marital Status: Legally Separated    Spouse Name: N/A  . Number of Children: N/A  . Years of Education: N/A   Occupational History  . Not on file.   Social  History Main Topics  . Smoking status: Former Smoker -- 0.25 packs/day    Quit date: 10/21/2013  . Smokeless tobacco: Not on file  . Alcohol Use: Yes     Comment: rare occasion  . Drug Use: No  . Sexual Activity: Not on file   Other Topics Concern  . 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 Remaining systems are negative.  Physical Exam: Well-developed well-nourished in no acute distress.  Skin is warm and dry.  HEENT is normal.  Neck is supple.  Chest is clear to auscultation with normal expansion.  Cardiovascular exam is regular rate and rhythm. 2/6 systolic murmur Abdominal exam nontender or distended. No masses palpated. Extremities show 1+ edema. neuro grossly intact  ECG sinus rhythm at a rate of 59. Left ventricular hypertrophy. Anterior and inferior T-wave inversion.

## 2015-02-23 ENCOUNTER — Encounter: Payer: Self-pay | Admitting: Cardiology

## 2015-02-23 ENCOUNTER — Ambulatory Visit (INDEPENDENT_AMBULATORY_CARE_PROVIDER_SITE_OTHER): Payer: PRIVATE HEALTH INSURANCE | Admitting: Cardiology

## 2015-02-23 ENCOUNTER — Other Ambulatory Visit: Payer: Self-pay | Admitting: *Deleted

## 2015-02-23 VITALS — BP 124/70 | HR 59 | Ht 66.5 in | Wt 227.2 lb

## 2015-02-23 DIAGNOSIS — I10 Essential (primary) hypertension: Secondary | ICD-10-CM

## 2015-02-23 DIAGNOSIS — Z79899 Other long term (current) drug therapy: Secondary | ICD-10-CM | POA: Diagnosis not present

## 2015-02-23 DIAGNOSIS — E782 Mixed hyperlipidemia: Secondary | ICD-10-CM | POA: Diagnosis not present

## 2015-02-23 DIAGNOSIS — I351 Nonrheumatic aortic (valve) insufficiency: Secondary | ICD-10-CM

## 2015-02-23 DIAGNOSIS — R609 Edema, unspecified: Secondary | ICD-10-CM

## 2015-02-23 DIAGNOSIS — R0789 Other chest pain: Secondary | ICD-10-CM | POA: Diagnosis not present

## 2015-02-23 MED ORDER — AMLODIPINE-ATORVASTATIN 10-20 MG PO TABS: 1.0000 | ORAL_TABLET | Freq: Every day | ORAL | Status: DC

## 2015-02-23 MED ORDER — FUROSEMIDE 40 MG PO TABS: 60.0000 mg | ORAL_TABLET | Freq: Every day | ORAL | Status: DC

## 2015-02-23 NOTE — Assessment & Plan Note (Signed)
Increased edema. Increase Lasix to 60 mg daily. Check potassium, renal function and BNP in 1 week.

## 2015-02-23 NOTE — Assessment & Plan Note (Signed)
Continue ACE inhibitor and beta blocker. LV function improved on most recent echocardiogram.

## 2015-02-23 NOTE — Assessment & Plan Note (Signed)
Repeat echocardiogram when she returns in 6 months.

## 2015-02-23 NOTE — Patient Instructions (Addendum)
INCREASE Furosemide to 60mg  daily (1 1/2 tablets).  A new Rx has been sent to your pharmacy.  Your physician recommends that you return for lab work in: 1 week at [facility].  Your physician recommends that you schedule a follow-up appointment in: 6 months with Dr. Stanford Breed.

## 2015-02-23 NOTE — Assessment & Plan Note (Signed)
Blood pressure controlled. Continue present medications. 

## 2015-02-23 NOTE — Assessment & Plan Note (Signed)
Continue statin. 

## 2015-02-23 NOTE — Assessment & Plan Note (Signed)
Patient is on chronic Coumadin for history of recurrent DVTs.

## 2015-03-02 ENCOUNTER — Other Ambulatory Visit: Payer: Self-pay

## 2015-03-02 MED ORDER — HYDRALAZINE HCL 25 MG PO TABS: 25.0000 mg | ORAL_TABLET | Freq: Three times a day (TID) | ORAL | Status: DC

## 2015-03-05 LAB — BASIC METABOLIC PANEL
BUN: 11 mg/dL (ref 6–23)
CHLORIDE: 107 meq/L (ref 96–112)
CO2: 26 meq/L (ref 19–32)
Calcium: 8.6 mg/dL (ref 8.4–10.5)
Creat: 0.93 mg/dL (ref 0.50–1.10)
GLUCOSE: 102 mg/dL — AB (ref 70–99)
Potassium: 3.5 mEq/L (ref 3.5–5.3)
SODIUM: 143 meq/L (ref 135–145)

## 2015-03-05 LAB — BRAIN NATRIURETIC PEPTIDE: Brain Natriuretic Peptide: 29.4 pg/mL (ref 0.0–100.0)

## 2015-03-31 ENCOUNTER — Ambulatory Visit (INDEPENDENT_AMBULATORY_CARE_PROVIDER_SITE_OTHER): Payer: PRIVATE HEALTH INSURANCE | Admitting: General Practice

## 2015-03-31 DIAGNOSIS — I82409 Acute embolism and thrombosis of unspecified deep veins of unspecified lower extremity: Secondary | ICD-10-CM

## 2015-03-31 DIAGNOSIS — Z5181 Encounter for therapeutic drug level monitoring: Secondary | ICD-10-CM

## 2015-03-31 LAB — POCT INR: INR: 1.9

## 2015-03-31 NOTE — Progress Notes (Signed)
Pre visit review using our clinic review tool, if applicable. No additional management support is needed unless otherwise documented below in the visit note. 

## 2015-04-02 ENCOUNTER — Other Ambulatory Visit: Payer: Self-pay | Admitting: *Deleted

## 2015-04-02 MED ORDER — METOPROLOL TARTRATE 25 MG PO TABS: ORAL_TABLET | ORAL | Status: DC

## 2015-04-02 MED ORDER — RAMIPRIL 10 MG PO CAPS: ORAL_CAPSULE | ORAL | Status: DC

## 2015-04-05 NOTE — Progress Notes (Signed)
I have reviewed and agree with the plan. 

## 2015-05-12 ENCOUNTER — Ambulatory Visit (INDEPENDENT_AMBULATORY_CARE_PROVIDER_SITE_OTHER): Payer: PRIVATE HEALTH INSURANCE | Admitting: General Practice

## 2015-05-12 DIAGNOSIS — Z5181 Encounter for therapeutic drug level monitoring: Secondary | ICD-10-CM

## 2015-05-12 DIAGNOSIS — I82409 Acute embolism and thrombosis of unspecified deep veins of unspecified lower extremity: Secondary | ICD-10-CM | POA: Diagnosis not present

## 2015-05-12 LAB — POCT INR: INR: 1.3

## 2015-05-12 NOTE — Progress Notes (Signed)
Pre visit review using our clinic review tool, if applicable. No additional management support is needed unless otherwise documented below in the visit note. 

## 2015-05-12 NOTE — Progress Notes (Signed)
I have reviewed and agree with the plan. 

## 2015-05-26 ENCOUNTER — Ambulatory Visit (INDEPENDENT_AMBULATORY_CARE_PROVIDER_SITE_OTHER): Payer: PRIVATE HEALTH INSURANCE | Admitting: General Practice

## 2015-05-26 DIAGNOSIS — I82409 Acute embolism and thrombosis of unspecified deep veins of unspecified lower extremity: Secondary | ICD-10-CM

## 2015-05-26 DIAGNOSIS — Z5181 Encounter for therapeutic drug level monitoring: Secondary | ICD-10-CM

## 2015-05-26 LAB — POCT INR: INR: 2.4

## 2015-05-26 NOTE — Progress Notes (Signed)
I have reviewed and agree with the plan. 

## 2015-05-26 NOTE — Progress Notes (Signed)
Pre visit review using our clinic review tool, if applicable. No additional management support is needed unless otherwise documented below in the visit note. 

## 2015-06-23 ENCOUNTER — Ambulatory Visit (INDEPENDENT_AMBULATORY_CARE_PROVIDER_SITE_OTHER): Payer: PRIVATE HEALTH INSURANCE | Admitting: General Practice

## 2015-06-23 DIAGNOSIS — Z5181 Encounter for therapeutic drug level monitoring: Secondary | ICD-10-CM | POA: Diagnosis not present

## 2015-06-23 LAB — POCT INR: INR: 2.1

## 2015-06-23 NOTE — Progress Notes (Signed)
I have reviewed and agree with the plan. 

## 2015-06-23 NOTE — Progress Notes (Signed)
Pre visit review using our clinic review tool, if applicable. No additional management support is needed unless otherwise documented below in the visit note. 

## 2015-07-08 ENCOUNTER — Other Ambulatory Visit: Payer: Self-pay | Admitting: Cardiology

## 2015-07-08 MED ORDER — AMLODIPINE-ATORVASTATIN 10-20 MG PO TABS: 1.0000 | ORAL_TABLET | Freq: Every day | ORAL | Status: DC

## 2015-07-09 ENCOUNTER — Other Ambulatory Visit: Payer: Self-pay | Admitting: *Deleted

## 2015-07-21 ENCOUNTER — Ambulatory Visit (INDEPENDENT_AMBULATORY_CARE_PROVIDER_SITE_OTHER): Payer: PRIVATE HEALTH INSURANCE | Admitting: General Practice

## 2015-07-21 DIAGNOSIS — Z5181 Encounter for therapeutic drug level monitoring: Secondary | ICD-10-CM

## 2015-07-21 DIAGNOSIS — I82409 Acute embolism and thrombosis of unspecified deep veins of unspecified lower extremity: Secondary | ICD-10-CM

## 2015-07-21 LAB — POCT INR: INR: 2.1

## 2015-07-21 NOTE — Progress Notes (Signed)
Pre visit review using our clinic review tool, if applicable. No additional management support is needed unless otherwise documented below in the visit note. 

## 2015-07-21 NOTE — Progress Notes (Signed)
I have reviewed and agree with the plan. 

## 2015-08-20 ENCOUNTER — Other Ambulatory Visit: Payer: Self-pay | Admitting: Internal Medicine

## 2015-08-24 ENCOUNTER — Other Ambulatory Visit: Payer: Self-pay | Admitting: Cardiology

## 2015-09-01 ENCOUNTER — Ambulatory Visit (INDEPENDENT_AMBULATORY_CARE_PROVIDER_SITE_OTHER): Payer: PRIVATE HEALTH INSURANCE | Admitting: General Practice

## 2015-09-01 DIAGNOSIS — I82409 Acute embolism and thrombosis of unspecified deep veins of unspecified lower extremity: Secondary | ICD-10-CM

## 2015-09-01 DIAGNOSIS — Z5181 Encounter for therapeutic drug level monitoring: Secondary | ICD-10-CM

## 2015-09-01 LAB — POCT INR: INR: 2

## 2015-09-01 NOTE — Progress Notes (Signed)
I have reviewed and agree with the plan. 

## 2015-09-01 NOTE — Progress Notes (Signed)
Pre visit review using our clinic review tool, if applicable. No additional management support is needed unless otherwise documented below in the visit note. 

## 2015-09-21 ENCOUNTER — Other Ambulatory Visit: Payer: Self-pay

## 2015-09-21 MED ORDER — HYDRALAZINE HCL 25 MG PO TABS: 25.0000 mg | ORAL_TABLET | Freq: Three times a day (TID) | ORAL | Status: DC

## 2015-09-21 MED ORDER — AMLODIPINE-ATORVASTATIN 10-20 MG PO TABS: 1.0000 | ORAL_TABLET | Freq: Every day | ORAL | Status: DC

## 2015-10-13 ENCOUNTER — Ambulatory Visit: Payer: PRIVATE HEALTH INSURANCE

## 2015-10-20 ENCOUNTER — Ambulatory Visit: Payer: PRIVATE HEALTH INSURANCE

## 2015-10-21 ENCOUNTER — Other Ambulatory Visit: Payer: Self-pay | Admitting: Cardiology

## 2015-10-21 NOTE — Telephone Encounter (Signed)
Rx request sent to pharmacy.  

## 2015-10-22 ENCOUNTER — Other Ambulatory Visit: Payer: Self-pay | Admitting: Cardiology

## 2015-11-15 ENCOUNTER — Other Ambulatory Visit: Payer: Self-pay | Admitting: Family Medicine

## 2015-11-15 ENCOUNTER — Telehealth: Payer: Self-pay | Admitting: Family Medicine

## 2015-11-15 DIAGNOSIS — Z0000 Encounter for general adult medical examination without abnormal findings: Secondary | ICD-10-CM

## 2015-11-15 NOTE — Telephone Encounter (Signed)
lmom for pt to call back

## 2015-11-15 NOTE — Telephone Encounter (Signed)
I have placed the lab orders.  Please help pt to make lab and cpx appointments.  Thanks! Lab work will be fasting.        Pt in coumadin portochol sees cards No pcp appt since 2015  Please have her schedule A cpx ( can work in Or use a Wednesday  If needed for her schedule) in the next 3-4 months

## 2015-11-17 ENCOUNTER — Other Ambulatory Visit: Payer: Self-pay | Admitting: *Deleted

## 2015-11-17 MED ORDER — HYDRALAZINE HCL 25 MG PO TABS: 25.0000 mg | ORAL_TABLET | Freq: Three times a day (TID) | ORAL | Status: DC

## 2015-11-17 NOTE — Telephone Encounter (Signed)
Pt has been sch

## 2015-11-22 ENCOUNTER — Other Ambulatory Visit: Payer: Self-pay

## 2015-11-22 MED ORDER — METOPROLOL TARTRATE 25 MG PO TABS: 25.0000 mg | ORAL_TABLET | Freq: Two times a day (BID) | ORAL | Status: DC

## 2015-11-22 NOTE — Telephone Encounter (Signed)
Rx(s) sent to pharmacy electronically.  

## 2015-12-07 NOTE — Progress Notes (Signed)
HPI: FU nonischemic cardiomyopathy improved by most recent echocardiogram and a history of aortic insufficiency. Previous catheterization in 2004 showed no obstructive coronary disease. CT of her chest in Nov 2008 showed no thoracic aneurysm. Previous Holter monitor secondary to "dizzy spells" showed PACs and PVCs. Nuclear study April 2014 showed an ejection fraction of 62% and normal perfusion. Patient underwent transesophageal echocardiogram in May of 2014. Her ejection fraction was 50-55%. There was moderate aortic insufficiency and mild mitral regurgitation. There was a linear density associated with the atrial septum of uncertain etiology. Last echocardiogram in April of 2015 showed normal LV function, mild to moderate aortic and mitral regurgitation, moderate left atrial enlargement. Since I last saw her, the patient has dyspnea with more extreme activities but not with routine activities. It is relieved with rest. It is not associated with chest pain. There is no orthopnea, PND. There is no syncope or palpitations. There is no exertional chest pain. Occasional mild pedal edema.   Current Outpatient Prescriptions  Medication Sig Dispense Refill  . albuterol (PROVENTIL HFA;VENTOLIN HFA) 108 (90 BASE) MCG/ACT inhaler Inhale 2 puffs into the lungs every 6 (six) hours as needed for wheezing. 1 Inhaler 0  . amlodipine-atorvastatin (CADUET) 10-20 MG tablet Take 1 tablet by mouth daily. 30 tablet 0  . chlorpheniramine-HYDROcodone (TUSSIONEX PENNKINETIC ER) 10-8 MG/5ML LQCR Take 5 mLs by mouth at bedtime as needed for cough. 115 mL 0  . cholecalciferol (VITAMIN D) 1000 UNITS tablet Take 1,000 Units by mouth daily.      . fexofenadine (ALLEGRA) 60 MG tablet Take 1 tablet (60 mg total) by mouth daily. 90 tablet 0  . fluticasone (FLONASE) 50 MCG/ACT nasal spray USE 2 SPRAYS IN EACH NOSTRIL DAILY. PT NEEDS TO SCHEDULE A FOLLOW UP APPT BEFORE NEXT REFILL. 16 g 0  . furosemide (LASIX) 40 MG tablet Take  1.5 tablets (60 mg total) by mouth daily. 45 tablet 3  . hydrALAZINE (APRESOLINE) 25 MG tablet Take 1 tablet (25 mg total) by mouth 3 (three) times daily. 90 tablet 0  . metoprolol tartrate (LOPRESSOR) 25 MG tablet Take 1 tablet (25 mg total) by mouth 2 (two) times daily. MUST KEEP APPOINTMENT 12/07/2015 WITH DR Terrin Meddaugh FOR FUTURE REFILLS 60 tablet 0  . potassium chloride SA (K-DUR,KLOR-CON) 20 MEQ tablet Take 1 tablet (20 mEq total) by mouth daily. 30 tablet 6  . ramipril (ALTACE) 10 MG capsule Take 1 capsule (10 mg total) by mouth 2 (two) times daily. Please schedule appointment. 60 capsule 0  . warfarin (COUMADIN) 5 MG tablet TAKE AS DIRECTED BY ANTICOAGULATION CLINIC 90 tablet 1   No current facility-administered medications for this visit.     Past Medical History  Diagnosis Date  . Other primary cardiomyopathies   . Hypertension   . Aortic valve disorders   . Hyperlipidemia   . Personal history of colonic polyps   . Personal history of venous thrombosis and embolism   . Asthma   . Allergic rhinitis   . Long term (current) use of anticoagulants     Past Surgical History  Procedure Laterality Date  . Abdominal hysterectomy    . Rt knee arthoscopic    . Dilation and curettage of uterus      several  . Cardiac catheterization    . Tee without cardioversion N/A 02/21/2013    Procedure: TRANSESOPHAGEAL ECHOCARDIOGRAM (TEE);  Surgeon: Lelon Perla, MD;  Location[facility];  Service: Cardiovascular;  Laterality: N/A;  Social History   Social History  . Marital Status: Legally Separated    Spouse Name: N/A  . Number of Children: N/A  . Years of Education: N/A   Occupational History  . Not on file.   Social History Main Topics  . Smoking status: Former Smoker -- 0.25 packs/day    Quit date: 10/21/2013  . Smokeless tobacco: Not on file  . Alcohol Use: Yes     Comment: rare occasion  . Drug Use: No  . Sexual Activity: Not on file   Other Topics Concern  . 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 Alcohol abuse    . Colon cancer    . Depression    . Hyperlipidemia    . Hypertension    . Kidney disease    . Uterine cancer    . Breast cancer      cousin  . Stroke      grandmother    ROS: no fevers or chills, productive cough, hemoptysis, dysphasia, odynophagia, melena, hematochezia, dysuria, hematuria, rash, seizure activity, orthopnea, PND, claudication. Remaining systems are negative.  Physical Exam: Well-developed obese in no acute distress.  Skin is warm and dry.  HEENT is normal.  Neck is supple.  Chest is clear to auscultation with normal expansion.  Cardiovascular exam is regular rate and rhythm. 1/6 diastolic murmur Abdominal exam nontender or distended. No masses palpated. Extremities show trace edema. neuro grossly intact  ECG Normal sinus rhythm at a rate of 63. Inferior lateral T-wave inversion. Left ventricular hypertrophy.

## 2015-12-16 ENCOUNTER — Encounter: Payer: Self-pay | Admitting: Cardiology

## 2015-12-16 ENCOUNTER — Encounter: Payer: Self-pay | Admitting: *Deleted

## 2015-12-16 ENCOUNTER — Ambulatory Visit (INDEPENDENT_AMBULATORY_CARE_PROVIDER_SITE_OTHER): Payer: PRIVATE HEALTH INSURANCE | Admitting: Cardiology

## 2015-12-16 VITALS — BP 122/74 | HR 63 | Ht 66.0 in | Wt 227.0 lb

## 2015-12-16 DIAGNOSIS — I10 Essential (primary) hypertension: Secondary | ICD-10-CM | POA: Diagnosis not present

## 2015-12-16 DIAGNOSIS — I359 Nonrheumatic aortic valve disorder, unspecified: Secondary | ICD-10-CM | POA: Diagnosis not present

## 2015-12-16 DIAGNOSIS — E785 Hyperlipidemia, unspecified: Secondary | ICD-10-CM

## 2015-12-16 DIAGNOSIS — E782 Mixed hyperlipidemia: Secondary | ICD-10-CM

## 2015-12-16 DIAGNOSIS — I351 Nonrheumatic aortic (valve) insufficiency: Secondary | ICD-10-CM | POA: Diagnosis not present

## 2015-12-16 NOTE — Patient Instructions (Signed)
Medication Instructions:   NO CHANGE  Labwork:  Your physician recommends that you return for lab work WHEN FASTING  Testing/Procedures:  Your physician has requested that you have an echocardiogram. Echocardiography is a painless test that uses sound waves to create images of your heart. It provides your doctor with information about the size and shape of your heart and how well your heart's chambers and valves are working. This procedure takes approximately one hour. There are no restrictions for this procedure.    Follow-Up:  Your physician wants you to follow-up in: [place] will receive a reminder letter in the mail two months in advance. If you don't receive a letter, please call our office to schedule the follow-up appointment.   If you need a refill on your cardiac medications before your next appointment, please call your pharmacy.

## 2015-12-16 NOTE — Assessment & Plan Note (Signed)
Continue statin. Check lipids and liver. 

## 2015-12-16 NOTE — Assessment & Plan Note (Signed)
Plan repeat echocardiogram. 

## 2015-12-16 NOTE — Assessment & Plan Note (Signed)
Continue present dose of Lasix. Check potassium and renal function. 

## 2015-12-16 NOTE — Assessment & Plan Note (Signed)
LV function improved on most recent echo. Continue beta blocker and ACE inhibitor.

## 2015-12-16 NOTE — Assessment & Plan Note (Signed)
Blood pressure controlled. Continue present medications. 

## 2015-12-16 NOTE — Assessment & Plan Note (Signed)
Patient on chronic Coumadin for recurrent DVTs.

## 2015-12-17 ENCOUNTER — Other Ambulatory Visit: Payer: Self-pay | Admitting: Cardiology

## 2015-12-17 LAB — LIPID PANEL
Cholesterol: 147 mg/dL (ref 125–200)
HDL: 45 mg/dL — ABNORMAL LOW
LDL Cholesterol: 78 mg/dL
Total CHOL/HDL Ratio: 3.3 ratio
Triglycerides: 118 mg/dL
VLDL: 24 mg/dL

## 2015-12-17 LAB — HEPATIC FUNCTION PANEL
ALT: 18 U/L (ref 6–29)
AST: 18 U/L (ref 10–35)
Albumin: 4.1 g/dL (ref 3.6–5.1)
Alkaline Phosphatase: 130 U/L (ref 33–130)
Bilirubin, Direct: 0.1 mg/dL
Indirect Bilirubin: 0.3 mg/dL (ref 0.2–1.2)
Total Bilirubin: 0.4 mg/dL (ref 0.2–1.2)
Total Protein: 6.7 g/dL (ref 6.1–8.1)

## 2015-12-17 LAB — BASIC METABOLIC PANEL
BUN: 9 mg/dL (ref 7–25)
CALCIUM: 9.1 mg/dL (ref 8.6–10.4)
CHLORIDE: 108 mmol/L (ref 98–110)
CO2: 25 mmol/L (ref 20–31)
Creat: 1.08 mg/dL — ABNORMAL HIGH (ref 0.50–0.99)
Glucose, Bld: 100 mg/dL — ABNORMAL HIGH (ref 65–99)
Potassium: 3.8 mmol/L (ref 3.5–5.3)
SODIUM: 145 mmol/L (ref 135–146)

## 2015-12-20 ENCOUNTER — Other Ambulatory Visit: Payer: Self-pay | Admitting: Cardiology

## 2015-12-20 NOTE — Telephone Encounter (Signed)
Rx request sent to pharmacy.  

## 2015-12-24 ENCOUNTER — Other Ambulatory Visit: Payer: Self-pay | Admitting: Cardiology

## 2015-12-24 NOTE — Telephone Encounter (Signed)
Rx request sent to pharmacy.  

## 2016-01-04 ENCOUNTER — Other Ambulatory Visit: Payer: Self-pay

## 2016-01-04 ENCOUNTER — Ambulatory Visit (HOSPITAL_COMMUNITY): Payer: PRIVATE HEALTH INSURANCE | Attending: Cardiology

## 2016-01-04 DIAGNOSIS — I340 Nonrheumatic mitral (valve) insufficiency: Secondary | ICD-10-CM | POA: Insufficient documentation

## 2016-01-04 DIAGNOSIS — I429 Cardiomyopathy, unspecified: Secondary | ICD-10-CM | POA: Insufficient documentation

## 2016-01-04 DIAGNOSIS — I110 Hypertensive heart disease with heart failure: Secondary | ICD-10-CM | POA: Insufficient documentation

## 2016-01-04 DIAGNOSIS — I359 Nonrheumatic aortic valve disorder, unspecified: Secondary | ICD-10-CM | POA: Diagnosis not present

## 2016-01-04 DIAGNOSIS — I351 Nonrheumatic aortic (valve) insufficiency: Secondary | ICD-10-CM | POA: Diagnosis not present

## 2016-01-04 DIAGNOSIS — Z87891 Personal history of nicotine dependence: Secondary | ICD-10-CM | POA: Diagnosis not present

## 2016-01-04 DIAGNOSIS — I509 Heart failure, unspecified: Secondary | ICD-10-CM | POA: Diagnosis not present

## 2016-01-04 DIAGNOSIS — E785 Hyperlipidemia, unspecified: Secondary | ICD-10-CM | POA: Diagnosis not present

## 2016-01-19 ENCOUNTER — Other Ambulatory Visit: Payer: PRIVATE HEALTH INSURANCE

## 2016-01-25 NOTE — Progress Notes (Signed)
Pre visit review using our clinic review tool, if applicable. No additional management support is needed unless otherwise documented below in the visit note.  Chief Complaint  Patient presents with  . Annual Exam    HPI: Patient  [name] Foster  65 y.o. comes in today for Preventive Health Care visit    Last visit with pcp was 2014  Saw dr Maudie Mercury for asthmatic sx  In dec 15 Followed by cardiology for FU nonischemic cardiomyopathy improved by most recent echocardiogram and a history of aortic insufficiency.   Stable  On coumadin getting protime about q 6 weeks   At goal  Due for mammogram a nd colonover due  pta aware and will schedule   Stopped tobacco last year Last labs jan  For lipids bmp  Health Maintenance  Topic Date Due  . MAMMOGRAM  02/11/2015  . ZOSTAVAX  01/25/2017 (Originally 05/28/2012)  . TETANUS/TDAP  01/25/2017 (Originally [birth year]1971)  . Hepatitis C Screening  01/25/2017 (Originally [birth year])  . HIV Screening  01/25/2017 (Originally [birth year]1967)  . INFLUENZA VACCINE  05/02/2016  . COLONOSCOPY  06/25/2017   Health Maintenance Review LIFESTYLE:  Exercise:   Just work  no Tobacco/ETS: no feb 2016 Alcohol: rare to none  Sugar beverages: pepsi  Every day  Dec to 16 oz  Sleep: 2 am to 6 30   Or 1 hour nap.  Drug use: no 40 hours  hh of 5  No pets .    ROS:  GEN/ HEENT: No fever, significant weight changes sweats headaches vision problems hearing changes, CV/ PULM; No chest pain shortness of breath cough, syncope,  change in exercise tolerance.gets edema at end of day  GI /GU: No adominal pain, vomiting, change in bowel habits. No blood in the stool. No significant GU symptoms. SKIN/HEME: ,no acute skin rashes suspicious lesions or bleeding. No lymphadenopathy, nodules, masses.  NEURO/ PSYCH:  No neurologic signs such as weakness numbness. No depression anxiety. IMM/ Allergy: No unusual infections.  Allergy .   REST of 12 system review negative except as per  HPI   Past Medical History  Diagnosis Date  . Other primary cardiomyopathies   . Hypertension   . Aortic valve disorders   . Hyperlipidemia   . Personal history of colonic polyps   . Personal history of venous thrombosis and embolism   . Asthma   . Allergic rhinitis   . Long term (current) use of anticoagulants     Past Surgical History  Procedure Laterality Date  . Abdominal hysterectomy    . Rt knee arthoscopic    . Dilation and curettage of uterus      several  . Cardiac catheterization    . Tee without cardioversion N/A 02/21/2013    Procedure: TRANSESOPHAGEAL ECHOCARDIOGRAM (TEE);  Surgeon: Lelon Perla, MD;  Location: [facility] ENDOSCOPY;  Service: Cardiovascular;  Laterality: N/A;    Family History  Problem Relation Age of Onset  . Alcohol abuse    . Colon cancer    . Depression    . Hyperlipidemia    . Hypertension    . Kidney disease    . Uterine cancer    . Breast cancer      cousin  . Stroke      grandmother    Social History   Social History  . Marital Status: Legally Separated    Spouse Name: N/A  . Number of Children: N/A  . Years of Education: N/A   Social History Main Topics  .  Smoking status: Former Smoker -- 0.25 packs/day    Quit date: 10/21/2013  . Smokeless tobacco: None  . Alcohol Use: Yes     Comment: rare occasion  . Drug Use: No  . Sexual Activity: Not Asked   Other Topics Concern  . 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    Divorced   Regular exercise- no   5 hours sleep    Lives with a son age 8    No pets          Outpatient Prescriptions Prior to Visit  Medication Sig Dispense Refill  . albuterol (PROVENTIL HFA;VENTOLIN HFA) 108 (90 BASE) MCG/ACT inhaler Inhale 2 puffs into the lungs every 6 (six) hours as needed for wheezing. 1 Inhaler 0  . amlodipine-atorvastatin (CADUET) 10-20 MG tablet Take 1 tablet by mouth daily. 30 tablet 11  . chlorpheniramine-HYDROcodone (TUSSIONEX  PENNKINETIC ER) 10-8 MG/5ML LQCR Take 5 mLs by mouth at bedtime as needed for cough. 115 mL 0  . cholecalciferol (VITAMIN D) 1000 UNITS tablet Take 1,000 Units by mouth daily.      . fluticasone (FLONASE) 50 MCG/ACT nasal spray USE 2 SPRAYS IN EACH NOSTRIL DAILY. PT NEEDS TO SCHEDULE A FOLLOW UP APPT BEFORE NEXT REFILL. 16 g 0  . furosemide (LASIX) 40 MG tablet Take 1.5 tablets (60 mg total) by mouth daily. 45 tablet 3  . hydrALAZINE (APRESOLINE) 25 MG tablet TAKE 1 TABLET (25 MG TOTAL) BY MOUTH 3 (THREE) TIMES DAILY. 90 tablet 11  . metoprolol tartrate (LOPRESSOR) 25 MG tablet Take 1 tablet (25 mg total) by mouth 2 (two) times daily. 60 tablet 11  . potassium chloride SA (K-DUR,KLOR-CON) 20 MEQ tablet Take 1 tablet (20 mEq total) by mouth daily. 30 tablet 6  . ramipril (ALTACE) 10 MG capsule TAKE ONE CAPSULE BY MOUTH TWICE A DAY 60 capsule 1  . warfarin (COUMADIN) 5 MG tablet TAKE AS DIRECTED BY ANTICOAGULATION CLINIC 90 tablet 1  . ramipril (ALTACE) 10 MG capsule Take 1 capsule (10 mg total) by mouth 2 (two) times daily. Please schedule appointment. 60 capsule 0  . fexofenadine (ALLEGRA) 60 MG tablet Take 1 tablet (60 mg total) by mouth daily. 90 tablet 0   No facility-administered medications prior to visit.     EXAM:  BP 126/76 mmHg  Temp(Src) 98.4 F (36.9 C) (Oral)  Ht '5\' 6"'  (1.676 m)  Wt 225 lb 12.8 oz (102.422 kg)  BMI 36.46 kg/m2  Body mass index is 36.46 kg/(m^2).  Physical Exam: Vital signs reviewed [id number] is a well-developed well-nourished alert cooperative    who appearsr stated age in no acute distress.  HEENT: normocephalic atraumatic , Eyes: PERRL EOM's full, conjunctiva clear, Nares: paten,t no deformity discharge or tenderness., Ears: no deformity EAC's clear TMs with normal landmarks. Mouth: clear OP, no lesions, edema.  Moist mucous membranes. Dentition in adequate repair. NECK: supple without masses, thyromegaly or bruits. CHEST/PULM:  Clear to auscultation and  percussion breath sounds equal no wheeze , rales or rhonchi. No chest wall deformities or tenderness.Breast: normal by inspection . No dimpling, discharge, masses, tenderness or discharge . CV: PMI is nondisplaced, S1 S2 no gallops,, rubs. Peripheral pulses are full without delay. 2/6 sem usb  No bruits heard No JVD .  ABDOMEN: Bowel sounds normal nontender  No guard or rebound, no hepato splenomegal no CVA tenderness.   Extremtities:  No clubbing cyanosis or edema, no acute joint swelling or  redness no focal atrophy NEURO:  Oriented x3, cranial nerves 3-12 appear to be intact, no obvious focal weakness,gait within normal limits no abnormal reflexes or asymmetrical SKIN: No acute rashes normal turgor, color, no bruising or petechiae. PSYCH: Oriented, good eye contact, no obvious depression anxiety, cognition and judgment appear normal. LN: no cervical axillary inguinal adenopathy  Lab Results  Component Value Date   WBC 8.6 01/31/2013   HGB 13.4 01/31/2013   HCT 40.6 01/31/2013   PLT 185.0 01/31/2013   GLUCOSE 100* 12/16/2015   CHOL 147 12/16/2015   TRIG 118 12/16/2015   HDL 45* 12/16/2015   LDLDIRECT 175.9 07/01/2007   LDLCALC 78 12/16/2015   ALT 18 12/16/2015   AST 18 12/16/2015   NA 145 12/16/2015   K 3.8 12/16/2015   CL 108 12/16/2015   CREATININE 1.08* 12/16/2015   BUN 9 12/16/2015   CO2 25 12/16/2015   TSH 1.02 09/29/2011   INR 2.0 09/01/2015    ASSESSMENT AND PLAN:  Discussed the following assessment and plan:  Visit for preventive health examination - Plan: CBC with Differential/Platelet, TSH, Hepatitis C antibody, HIV antibody, T4, free  Need for hepatitis C screening test - Plan: Hepatitis C antibody  Screening for HIV (human immunodeficiency virus) - Plan: HIV antibody  Aortic insufficiency - Plan: CBC with Differential/Platelet, TSH, Hepatitis C antibody, HIV antibody, T4, free  Chronic anticoagulation - Plan: CBC with Differential/Platelet, TSH, Hepatitis C  antibody, HIV antibody, T4, free  Cardiomyopathy, hypertensive, without heart failure (HCC) - Plan: CBC with Differential/Platelet, TSH, Hepatitis C antibody, HIV antibody, T4, free  Snoring reviewed health care parameters  Patient Care Team: Burnis Medin, MD as PCP - General Lelon Perla, MD (Cardiology) Susa Day, MD (Orthopedic Surgery) Patient Instructions  Will notify you  of labs when available.  Intensify lifestyle interventions. Avoid sweet drinks to avoid getting diabetes and weight gain . If snoring more severe stopping breathing contact team for reevaluation  Some people have sleep apnea that needs intervention.   congrats on stopping tobacco  Remain tobacco free!.   Health Maintenance, Female Adopting a healthy lifestyle and getting preventive care can go a long way to promote health and wellness. Talk with your health care provider about what schedule of regular examinations is right for you. This is a good chance for you to check in with your provider about disease prevention and staying healthy. In between checkups, there are plenty of things you can do on your own. Experts have done a lot of research about which lifestyle changes and preventive measures are most likely to keep you healthy. Ask your health care provider for more information. WEIGHT AND DIET  Eat a healthy diet  Be sure to include plenty of vegetables, fruits, low-fat dairy products, and lean protein.  Do not eat a lot of foods high in solid fats, added sugars, or salt.  Get regular exercise. This is one of the most important things you can do for your health.  Most adults should exercise for at least 150 minutes each week. The exercise should increase your heart rate and make you sweat (moderate-intensity exercise).  Most adults should also do strengthening exercises at least twice a week. This is in addition to the moderate-intensity exercise.  Maintain a healthy weight  Body mass index  (BMI) is a measurement that can be used to identify possible weight problems. It estimates body fat based on height and weight. Your health care provider can help determine your  BMI and help you achieve or maintain a healthy weight.  For females 25 years of age and older:   A BMI below 18.5 is considered underweight.  A BMI of 18.5 to 24.9 is normal.  A BMI of 25 to 29.9 is considered overweight.  A BMI of 30 and above is considered obese.  Watch levels of cholesterol and blood lipids  You should start having your blood tested for lipids and cholesterol at 64 years of age, then have this test every 5 years.  You may need to have your cholesterol levels checked more often if:  Your lipid or cholesterol levels are high.  You are older than 64 years of age.  You are at high risk for heart disease.  CANCER SCREENING   Lung Cancer  Lung cancer screening is recommended for adults 83-45 years old who are at high risk for lung cancer because of a history of smoking.  A yearly low-dose CT scan of the lungs is recommended for people who:  Currently smoke.  Have quit within the past 15 years.  Have at least a 30-pack-year history of smoking. A pack year is smoking an average of one pack of cigarettes a day for 1 year.  Yearly screening should continue until it has been 15 years since you quit.  Yearly screening should stop if you develop a health problem that would prevent you from having lung cancer treatment.  Breast Cancer  Practice breast self-awareness. This means understanding how your breasts normally appear and feel.  It also means doing regular breast self-exams. Let your health care provider know about any changes, no matter how small.  If you are in your 20s or 30s, you should have a clinical breast exam (CBE) by a health care provider every 1-3 years as part of a regular health exam.  If you are 67 or older, have a CBE every year. Also consider having a breast  X-ray (mammogram) every year.  If you have a family history of breast cancer, talk to your health care provider about genetic screening.  If you are at high risk for breast cancer, talk to your health care provider about having an MRI and a mammogram every year.  Breast cancer gene (BRCA) assessment is recommended for women who have family members with BRCA-related cancers. BRCA-related cancers include:  Breast.  Ovarian.  Tubal.  Peritoneal cancers.  Results of the assessment will determine the need for genetic counseling and BRCA1 and BRCA2 testing. Cervical Cancer Your health care provider may recommend that you be screened regularly for cancer of the pelvic organs (ovaries, uterus, and vagina). This screening involves a pelvic examination, including checking for microscopic changes to the surface of your cervix (Pap test). You may be encouraged to have this screening done every 3 years, beginning at age 3.  For women ages 9-65, health care providers may recommend pelvic exams and Pap testing every 3 years, or they may recommend the Pap and pelvic exam, combined with testing for human papilloma virus (HPV), every 5 years. Some types of HPV increase your risk of cervical cancer. Testing for HPV may also be done on women of any age with unclear Pap test results.  Other health care providers may not recommend any screening for nonpregnant women who are considered low risk for pelvic cancer and who do not have symptoms. Ask your health care provider if a screening pelvic exam is right for you.  If you have had past treatment for cervical  cancer or a condition that could lead to cancer, you need Pap tests and screening for cancer for at least 20 years after your treatment. If Pap tests have been discontinued, your risk factors (such as having a new sexual partner) need to be reassessed to determine if screening should resume. Some women have medical problems that increase the chance of  getting cervical cancer. In these cases, your health care provider may recommend more frequent screening and Pap tests. Colorectal Cancer  This type of cancer can be detected and often prevented.  Routine colorectal cancer screening usually begins at 64 years of age and continues through 64 years of age.  Your health care provider may recommend screening at an earlier age if you have risk factors for colon cancer.  Your health care provider may also recommend using home test kits to check for hidden blood in the stool.  A small camera at the end of a tube can be used to examine your colon directly (sigmoidoscopy or colonoscopy). This is done to check for the earliest forms of colorectal cancer.  Routine screening usually begins at age 70.  Direct examination of the colon should be repeated every 5-10 years through 64 years of age. However, you may need to be screened more often if early forms of precancerous polyps or small growths are found. Skin Cancer  Check your skin from head to toe regularly.  Tell your health care provider about any new moles or changes in moles, especially if there is a change in a mole's shape or color.  Also tell your health care provider if you have a mole that is larger than the size of a pencil eraser.  Always use sunscreen. Apply sunscreen liberally and repeatedly throughout the day.  Protect yourself by wearing long sleeves, pants, a wide-brimmed hat, and sunglasses whenever you are outside. HEART DISEASE, DIABETES, AND HIGH BLOOD PRESSURE   High blood pressure causes heart disease and increases the risk of stroke. High blood pressure is more likely to develop in:  People who have blood pressure in the high end of the normal range (130-139/85-89 mm Hg).  People who are overweight or obese.  People who are African American.  If you are 11-57 years of age, have your blood pressure checked every 3-5 years. If you are 72 years of age or older, have  your blood pressure checked every year. You should have your blood pressure measured twice--once when you are at a hospital or clinic, and once when you are not at a hospital or clinic. Record the average of the two measurements. To check your blood pressure when you are not at a hospital or clinic, you can use:  An automated blood pressure machine at a pharmacy.  A home blood pressure monitor.  If you are between 15 years and 72 years old, ask your health care provider if you should take aspirin to prevent strokes.  Have regular diabetes screenings. This involves taking a blood sample to check your fasting blood sugar level.  If you are at a normal weight and have a low risk for diabetes, have this test once every three years after 64 years of age.  If you are overweight and have a high risk for diabetes, consider being tested at a younger age or more often. PREVENTING INFECTION  Hepatitis B  If you have a higher risk for hepatitis B, you should be screened for this virus. You are considered at high risk for hepatitis B if:  You were born in a country where hepatitis B is common. Ask your health care provider which countries are considered high risk.  Your parents were born in a high-risk country, and you have not been immunized against hepatitis B (hepatitis B vaccine).  You have HIV or AIDS.  You use needles to inject street drugs.  You live with someone who has hepatitis B.  You have had sex with someone who has hepatitis B.  You get hemodialysis treatment.  You take certain medicines for conditions, including cancer, organ transplantation, and autoimmune conditions. Hepatitis C  Blood testing is recommended for:  Everyone born from [birth year] through 1964.  Anyone with known risk factors for hepatitis C. Sexually transmitted infections (STIs)  You should be screened for sexually transmitted infections (STIs) including gonorrhea and chlamydia if:  You are sexually active and  are younger than 64 years of age.  You are older than 64 years of age and your health care provider tells you that you are at risk for this type of infection.  Your sexual activity has changed since you were last screened and you are at an increased risk for chlamydia or gonorrhea. Ask your health care provider if you are at risk.  If you do not have HIV, but are at risk, it may be recommended that you take a prescription medicine daily to prevent HIV infection. This is called pre-exposure prophylaxis (PrEP). You are considered at risk if:  You are sexually active and do not regularly use condoms or know the HIV status of your partner(s).  You take drugs by injection.  You are sexually active with a partner who has HIV. Talk with your health care provider about whether you are at high risk of being infected with HIV. If you choose to begin PrEP, you should first be tested for HIV. You should then be tested every 3 months for as long as you are taking PrEP.  PREGNANCY   If you are premenopausal and you may become pregnant, ask your health care provider about preconception counseling.  If you may become pregnant, take 400 to 800 micrograms (mcg) of folic acid every day.  If you want to prevent pregnancy, talk to your health care provider about birth control (contraception). OSTEOPOROSIS AND MENOPAUSE   Osteoporosis is a disease in which the bones lose minerals and strength with aging. This can result in serious bone fractures. Your risk for osteoporosis can be identified using a bone density scan.  If you are 66 years of age or older, or if you are at risk for osteoporosis and fractures, ask your health care provider if you should be screened.  Ask your health care provider whether you should take a calcium or vitamin D supplement to lower your risk for osteoporosis.  Menopause may have certain physical symptoms and risks.  Hormone replacement therapy may reduce some of these symptoms  and risks. Talk to your health care provider about whether hormone replacement therapy is right for you.  HOME CARE INSTRUCTIONS   Schedule regular health, dental, and eye exams.  Stay current with your immunizations.   Do not use any tobacco products including cigarettes, chewing tobacco, or electronic cigarettes.  If you are pregnant, do not drink alcohol.  If you are breastfeeding, limit how much and how often you drink alcohol.  Limit alcohol intake to no more than 1 drink per day for nonpregnant women. One drink equals 12 ounces of beer, 5 ounces of wine, or 1 ounces  of hard liquor.  Do not use street drugs.  Do not share needles.  Ask your health care provider for help if you need support or information about quitting drugs.  Tell your health care provider if you often feel depressed.  Tell your health care provider if you have ever been abused or do not feel safe at home.   This information is not intended to replace advice given to you by your health care provider. Make sure you discuss any questions you have with your health care provider.   Document Released: 04/03/2011 Document Revised: 10/09/2014 Document Reviewed: 08/20/2013 Elsevier Interactive Patient Education 2016 Randall K. Chia Rock M.D.

## 2016-01-26 ENCOUNTER — Ambulatory Visit (INDEPENDENT_AMBULATORY_CARE_PROVIDER_SITE_OTHER): Payer: PRIVATE HEALTH INSURANCE | Admitting: Internal Medicine

## 2016-01-26 ENCOUNTER — Encounter: Payer: Self-pay | Admitting: Internal Medicine

## 2016-01-26 ENCOUNTER — Ambulatory Visit (INDEPENDENT_AMBULATORY_CARE_PROVIDER_SITE_OTHER): Payer: PRIVATE HEALTH INSURANCE | Admitting: General Practice

## 2016-01-26 VITALS — BP 126/76 | Temp 98.4°F | Ht 66.0 in | Wt 225.8 lb

## 2016-01-26 DIAGNOSIS — Z114 Encounter for screening for human immunodeficiency virus [HIV]: Secondary | ICD-10-CM | POA: Diagnosis not present

## 2016-01-26 DIAGNOSIS — Z5181 Encounter for therapeutic drug level monitoring: Secondary | ICD-10-CM | POA: Diagnosis not present

## 2016-01-26 DIAGNOSIS — I43 Cardiomyopathy in diseases classified elsewhere: Secondary | ICD-10-CM

## 2016-01-26 DIAGNOSIS — Z0000 Encounter for general adult medical examination without abnormal findings: Secondary | ICD-10-CM

## 2016-01-26 DIAGNOSIS — I119 Hypertensive heart disease without heart failure: Secondary | ICD-10-CM

## 2016-01-26 DIAGNOSIS — R0683 Snoring: Secondary | ICD-10-CM

## 2016-01-26 DIAGNOSIS — Z7901 Long term (current) use of anticoagulants: Secondary | ICD-10-CM | POA: Diagnosis not present

## 2016-01-26 DIAGNOSIS — I351 Nonrheumatic aortic (valve) insufficiency: Secondary | ICD-10-CM

## 2016-01-26 DIAGNOSIS — Z1159 Encounter for screening for other viral diseases: Secondary | ICD-10-CM | POA: Diagnosis not present

## 2016-01-26 LAB — T4, FREE: FREE T4: 0.76 ng/dL (ref 0.60–1.60)

## 2016-01-26 LAB — CBC WITH DIFFERENTIAL/PLATELET
BASOS ABS: 0.1 10*3/uL (ref 0.0–0.1)
Basophils Relative: 0.7 % (ref 0.0–3.0)
EOS ABS: 0.1 10*3/uL (ref 0.0–0.7)
Eosinophils Relative: 0.8 % (ref 0.0–5.0)
HEMATOCRIT: 40.9 % (ref 36.0–46.0)
HEMOGLOBIN: 13.4 g/dL (ref 12.0–15.0)
LYMPHS PCT: 44.3 % (ref 12.0–46.0)
Lymphs Abs: 3.7 10*3/uL (ref 0.7–4.0)
MCHC: 32.8 g/dL (ref 30.0–36.0)
MCV: 86.9 fl (ref 78.0–100.0)
MONOS PCT: 6 % (ref 3.0–12.0)
Monocytes Absolute: 0.5 10*3/uL (ref 0.1–1.0)
Neutro Abs: 4.1 10*3/uL (ref 1.4–7.7)
Neutrophils Relative %: 48.2 % (ref 43.0–77.0)
PLATELETS: 178 10*3/uL (ref 150.0–400.0)
RBC: 4.71 Mil/uL (ref 3.87–5.11)
RDW: 14.7 % (ref 11.5–15.5)
WBC: 8.5 10*3/uL (ref 4.0–10.5)

## 2016-01-26 LAB — POCT INR: INR: 2

## 2016-01-26 LAB — TSH: TSH: 1.27 u[IU]/mL (ref 0.35–4.50)

## 2016-01-26 NOTE — Progress Notes (Signed)
Pre visit review using our clinic review tool, if applicable. No additional management support is needed unless otherwise documented below in the visit note. 

## 2016-01-26 NOTE — Patient Instructions (Signed)
Will notify you  of labs when available.  Intensify lifestyle interventions. Avoid sweet drinks to avoid getting diabetes and weight gain . If snoring more severe stopping breathing contact team for reevaluation  Some people have sleep apnea that needs intervention.   congrats on stopping tobacco  Remain tobacco free!.   Health Maintenance, Female Adopting a healthy lifestyle and getting preventive care can go a long way to promote health and wellness. Talk with your health care provider about what schedule of regular examinations is right for you. This is a good chance for you to check in with your provider about disease prevention and staying healthy. In between checkups, there are plenty of things you can do on your own. Experts have done a lot of research about which lifestyle changes and preventive measures are most likely to keep you healthy. Ask your health care provider for more information. WEIGHT AND DIET  Eat a healthy diet  Be sure to include plenty of vegetables, fruits, low-fat dairy products, and lean protein.  Do not eat a lot of foods high in solid fats, added sugars, or salt.  Get regular exercise. This is one of the most important things you can do for your health.  Most adults should exercise for at least 150 minutes each week. The exercise should increase your heart rate and make you sweat (moderate-intensity exercise).  Most adults should also do strengthening exercises at least twice a week. This is in addition to the moderate-intensity exercise.  Maintain a healthy weight  Body mass index (BMI) is a measurement that can be used to identify possible weight problems. It estimates body fat based on height and weight. Your health care provider can help determine your BMI and help you achieve or maintain a healthy weight.  For females 33 years of age and older:   A BMI below 18.5 is considered underweight.  A BMI of 18.5 to 24.9 is normal.  A BMI of 25 to 29.9  is considered overweight.  A BMI of 30 and above is considered obese.  Watch levels of cholesterol and blood lipids  You should start having your blood tested for lipids and cholesterol at 64 years of age, then have this test every 5 years.  You may need to have your cholesterol levels checked more often if:  Your lipid or cholesterol levels are high.  You are older than 64 years of age.  You are at high risk for heart disease.  CANCER SCREENING   Lung Cancer  Lung cancer screening is recommended for adults 90-22 years old who are at high risk for lung cancer because of a history of smoking.  A yearly low-dose CT scan of the lungs is recommended for people who:  Currently smoke.  Have quit within the past 15 years.  Have at least a 30-pack-year history of smoking. A pack year is smoking an average of one pack of cigarettes a day for 1 year.  Yearly screening should continue until it has been 15 years since you quit.  Yearly screening should stop if you develop a health problem that would prevent you from having lung cancer treatment.  Breast Cancer  Practice breast self-awareness. This means understanding how your breasts normally appear and feel.  It also means doing regular breast self-exams. Let your health care provider know about any changes, no matter how small.  If you are in your 20s or 30s, you should have a clinical breast exam (CBE) by a health care  provider every 1-3 years as part of a regular health exam.  If you are 68 or older, have a CBE every year. Also consider having a breast X-ray (mammogram) every year.  If you have a family history of breast cancer, talk to your health care provider about genetic screening.  If you are at high risk for breast cancer, talk to your health care provider about having an MRI and a mammogram every year.  Breast cancer gene (BRCA) assessment is recommended for women who have family members with BRCA-related cancers.  BRCA-related cancers include:  Breast.  Ovarian.  Tubal.  Peritoneal cancers.  Results of the assessment will determine the need for genetic counseling and BRCA1 and BRCA2 testing. Cervical Cancer Your health care provider may recommend that you be screened regularly for cancer of the pelvic organs (ovaries, uterus, and vagina). This screening involves a pelvic examination, including checking for microscopic changes to the surface of your cervix (Pap test). You may be encouraged to have this screening done every 3 years, beginning at age 61.  For women ages 75-65, health care providers may recommend pelvic exams and Pap testing every 3 years, or they may recommend the Pap and pelvic exam, combined with testing for human papilloma virus (HPV), every 5 years. Some types of HPV increase your risk of cervical cancer. Testing for HPV may also be done on women of any age with unclear Pap test results.  Other health care providers may not recommend any screening for nonpregnant women who are considered low risk for pelvic cancer and who do not have symptoms. Ask your health care provider if a screening pelvic exam is right for you.  If you have had past treatment for cervical cancer or a condition that could lead to cancer, you need Pap tests and screening for cancer for at least 20 years after your treatment. If Pap tests have been discontinued, your risk factors (such as having a new sexual partner) need to be reassessed to determine if screening should resume. Some women have medical problems that increase the chance of getting cervical cancer. In these cases, your health care provider may recommend more frequent screening and Pap tests. Colorectal Cancer  This type of cancer can be detected and often prevented.  Routine colorectal cancer screening usually begins at 64 years of age and continues through 64 years of age.  Your health care provider may recommend screening at an earlier age if you  have risk factors for colon cancer.  Your health care provider may also recommend using home test kits to check for hidden blood in the stool.  A small camera at the end of a tube can be used to examine your colon directly (sigmoidoscopy or colonoscopy). This is done to check for the earliest forms of colorectal cancer.  Routine screening usually begins at age 47.  Direct examination of the colon should be repeated every 5-10 years through 64 years of age. However, you may need to be screened more often if early forms of precancerous polyps or small growths are found. Skin Cancer  Check your skin from head to toe regularly.  Tell your health care provider about any new moles or changes in moles, especially if there is a change in a mole's shape or color.  Also tell your health care provider if you have a mole that is larger than the size of a pencil eraser.  Always use sunscreen. Apply sunscreen liberally and repeatedly throughout the day.  Protect yourself  by wearing long sleeves, pants, a wide-brimmed hat, and sunglasses whenever you are outside. HEART DISEASE, DIABETES, AND HIGH BLOOD PRESSURE   High blood pressure causes heart disease and increases the risk of stroke. High blood pressure is more likely to develop in:  People who have blood pressure in the high end of the normal range (130-139/85-89 mm Hg).  People who are overweight or obese.  People who are African American.  If you are 51-52 years of age, have your blood pressure checked every 3-5 years. If you are 73 years of age or older, have your blood pressure checked every year. You should have your blood pressure measured twice--once when you are at a hospital or clinic, and once when you are not at a hospital or clinic. Record the average of the two measurements. To check your blood pressure when you are not at a hospital or clinic, you can use:  An automated blood pressure machine at a pharmacy.  A home blood pressure  monitor.  If you are between 80 years and 10 years old, ask your health care provider if you should take aspirin to prevent strokes.  Have regular diabetes screenings. This involves taking a blood sample to check your fasting blood sugar level.  If you are at a normal weight and have a low risk for diabetes, have this test once every three years after 64 years of age.  If you are overweight and have a high risk for diabetes, consider being tested at a younger age or more often. PREVENTING INFECTION  Hepatitis B  If you have a higher risk for hepatitis B, you should be screened for this virus. You are considered at high risk for hepatitis B if:  You were born in a country where hepatitis B is common. Ask your health care provider which countries are considered high risk.  Your parents were born in a high-risk country, and you have not been immunized against hepatitis B (hepatitis B vaccine).  You have HIV or AIDS.  You use needles to inject street drugs.  You live with someone who has hepatitis B.  You have had sex with someone who has hepatitis B.  You get hemodialysis treatment.  You take certain medicines for conditions, including cancer, organ transplantation, and autoimmune conditions. Hepatitis C  Blood testing is recommended for:  Everyone born from [birth year] through 1964.  Anyone with known risk factors for hepatitis C. Sexually transmitted infections (STIs)  You should be screened for sexually transmitted infections (STIs) including gonorrhea and chlamydia if:  You are sexually active and are younger than 64 years of age.  You are older than 64 years of age and your health care provider tells you that you are at risk for this type of infection.  Your sexual activity has changed since you were last screened and you are at an increased risk for chlamydia or gonorrhea. Ask your health care provider if you are at risk.  If you do not have HIV, but are at risk, it may be  recommended that you take a prescription medicine daily to prevent HIV infection. This is called pre-exposure prophylaxis (PrEP). You are considered at risk if:  You are sexually active and do not regularly use condoms or know the HIV status of your partner(s).  You take drugs by injection.  You are sexually active with a partner who has HIV. Talk with your health care provider about whether you are at high risk of being infected with HIV.  If you choose to begin PrEP, you should first be tested for HIV. You should then be tested every 3 months for as long as you are taking PrEP.  PREGNANCY   If you are premenopausal and you may become pregnant, ask your health care provider about preconception counseling.  If you may become pregnant, take 400 to 800 micrograms (mcg) of folic acid every day.  If you want to prevent pregnancy, talk to your health care provider about birth control (contraception). OSTEOPOROSIS AND MENOPAUSE   Osteoporosis is a disease in which the bones lose minerals and strength with aging. This can result in serious bone fractures. Your risk for osteoporosis can be identified using a bone density scan.  If you are 27 years of age or older, or if you are at risk for osteoporosis and fractures, ask your health care provider if you should be screened.  Ask your health care provider whether you should take a calcium or vitamin D supplement to lower your risk for osteoporosis.  Menopause may have certain physical symptoms and risks.  Hormone replacement therapy may reduce some of these symptoms and risks. Talk to your health care provider about whether hormone replacement therapy is right for you.  HOME CARE INSTRUCTIONS   Schedule regular health, dental, and eye exams.  Stay current with your immunizations.   Do not use any tobacco products including cigarettes, chewing tobacco, or electronic cigarettes.  If you are pregnant, do not drink alcohol.  If you are  breastfeeding, limit how much and how often you drink alcohol.  Limit alcohol intake to no more than 1 drink per day for nonpregnant women. One drink equals 12 ounces of beer, 5 ounces of wine, or 1 ounces of hard liquor.  Do not use street drugs.  Do not share needles.  Ask your health care provider for help if you need support or information about quitting drugs.  Tell your health care provider if you often feel depressed.  Tell your health care provider if you have ever been abused or do not feel safe at home.   This information is not intended to replace advice given to you by your health care provider. Make sure you discuss any questions you have with your health care provider.   Document Released: 04/03/2011 Document Revised: 10/09/2014 Document Reviewed: 08/20/2013 Elsevier Interactive Patient Education [employer].

## 2016-01-27 LAB — HIV ANTIBODY (ROUTINE TESTING W REFLEX): HIV: NONREACTIVE

## 2016-01-27 LAB — HEPATITIS C ANTIBODY: HCV Ab: NEGATIVE

## 2016-02-16 ENCOUNTER — Other Ambulatory Visit: Payer: Self-pay

## 2016-02-16 DIAGNOSIS — Z1231 Encounter for screening mammogram for malignant neoplasm of breast: Secondary | ICD-10-CM

## 2016-02-22 ENCOUNTER — Other Ambulatory Visit: Payer: Self-pay | Admitting: Internal Medicine

## 2016-02-22 ENCOUNTER — Other Ambulatory Visit: Payer: Self-pay | Admitting: General Practice

## 2016-02-22 ENCOUNTER — Other Ambulatory Visit: Payer: Self-pay | Admitting: Cardiology

## 2016-02-22 MED ORDER — WARFARIN SODIUM 5 MG PO TABS: ORAL_TABLET | ORAL | Status: DC

## 2016-02-22 NOTE — Telephone Encounter (Signed)
Rx(s) sent to pharmacy electronically.  

## 2016-02-25 ENCOUNTER — Ambulatory Visit
Admission: RE | Admit: 2016-02-25 | Discharge: 2016-02-25 | Disposition: A | Discharge status: Home / Self Care | Payer: PRIVATE HEALTH INSURANCE | Source: Ambulatory Visit

## 2016-02-25 DIAGNOSIS — Z1231 Encounter for screening mammogram for malignant neoplasm of breast: Secondary | ICD-10-CM

## 2016-02-27 ENCOUNTER — Other Ambulatory Visit: Payer: Self-pay | Admitting: Internal Medicine

## 2016-03-24 ENCOUNTER — Ambulatory Visit: Payer: PRIVATE HEALTH INSURANCE

## 2016-03-31 ENCOUNTER — Ambulatory Visit (INDEPENDENT_AMBULATORY_CARE_PROVIDER_SITE_OTHER): Payer: PRIVATE HEALTH INSURANCE | Admitting: General Practice

## 2016-03-31 DIAGNOSIS — Z5181 Encounter for therapeutic drug level monitoring: Secondary | ICD-10-CM

## 2016-03-31 DIAGNOSIS — I82409 Acute embolism and thrombosis of unspecified deep veins of unspecified lower extremity: Secondary | ICD-10-CM | POA: Diagnosis not present

## 2016-03-31 LAB — POCT INR: INR: 1.9

## 2016-03-31 NOTE — Progress Notes (Signed)
Pre visit review using our clinic review tool, if applicable. No additional management support is needed unless otherwise documented below in the visit note. 

## 2016-03-31 NOTE — Progress Notes (Signed)
I have reviewed and agree with the plan. 

## 2016-05-12 ENCOUNTER — Ambulatory Visit (INDEPENDENT_AMBULATORY_CARE_PROVIDER_SITE_OTHER): Payer: PRIVATE HEALTH INSURANCE | Admitting: General Practice

## 2016-05-12 DIAGNOSIS — I82409 Acute embolism and thrombosis of unspecified deep veins of unspecified lower extremity: Secondary | ICD-10-CM | POA: Diagnosis not present

## 2016-05-12 DIAGNOSIS — Z5181 Encounter for therapeutic drug level monitoring: Secondary | ICD-10-CM

## 2016-05-12 LAB — POCT INR: INR: 2.5

## 2016-05-12 NOTE — Progress Notes (Signed)
I have reviewed and agree with the plan. 

## 2016-05-30 ENCOUNTER — Other Ambulatory Visit: Payer: Self-pay | Admitting: Internal Medicine

## 2016-06-06 ENCOUNTER — Other Ambulatory Visit: Payer: Self-pay | Admitting: General Practice

## 2016-06-06 MED ORDER — WARFARIN SODIUM 5 MG PO TABS: ORAL_TABLET | ORAL | 1 refills | Status: DC

## 2016-06-23 ENCOUNTER — Ambulatory Visit (INDEPENDENT_AMBULATORY_CARE_PROVIDER_SITE_OTHER): Payer: PRIVATE HEALTH INSURANCE | Admitting: General Practice

## 2016-06-23 DIAGNOSIS — Z5181 Encounter for therapeutic drug level monitoring: Secondary | ICD-10-CM | POA: Diagnosis not present

## 2016-06-23 LAB — POCT INR: INR: 2.2

## 2016-06-23 NOTE — Progress Notes (Signed)
I have reviewed and agree with the plan. 

## 2016-06-30 ENCOUNTER — Telehealth: Payer: Self-pay | Admitting: Internal Medicine

## 2016-06-30 ENCOUNTER — Telehealth: Payer: Self-pay | Admitting: General Practice

## 2016-06-30 NOTE — Telephone Encounter (Signed)
Patient says that she will be having surgery on her shoulder on 10/3.  Patient says that her surgeon has given her instructions about stopping coumadin.

## 2016-06-30 NOTE — Telephone Encounter (Signed)
° °  Pt call to ask if the below med can be refill it has been a while she has had a refill    Pt said she is having surgery 07/04/16 and they want her to bring the below med     albuterol (PROVENTIL HFA;VENTOLIN HFA) 108 (90 BASE) MCG/ACT inhaler   Pharmacy [place]

## 2016-07-02 HISTORY — PX: SHOULDER ARTHROSCOPY W/ ROTATOR CUFF REPAIR: SHX2400

## 2016-07-03 MED ORDER — ALBUTEROL SULFATE HFA 108 (90 BASE) MCG/ACT IN AERS: 2.0000 | INHALATION_SPRAY | Freq: Four times a day (QID) | RESPIRATORY_TRACT | 2 refills | Status: DC | PRN

## 2016-07-03 NOTE — Telephone Encounter (Signed)
Rx sent to pharmacy as requested.

## 2016-07-17 NOTE — Progress Notes (Signed)
HPI:  Current Outpatient Prescriptions  Medication Sig Dispense Refill  . albuterol (PROVENTIL HFA;VENTOLIN HFA) 108 (90 Base) MCG/ACT inhaler Inhale 2 puffs into the lungs every 6 (six) hours as needed for wheezing. 1 Inhaler 2  . amlodipine-atorvastatin (CADUET) 10-20 MG tablet Take 1 tablet by mouth daily. 30 tablet 11  . chlorpheniramine-HYDROcodone (TUSSIONEX PENNKINETIC ER) 10-8 MG/5ML LQCR Take 5 mLs by mouth at bedtime as needed for cough. 115 mL 0  . cholecalciferol (VITAMIN D) 1000 UNITS tablet Take 1,000 Units by mouth daily.      . fexofenadine (ALLEGRA) 180 MG tablet Take 180 mg by mouth daily.    . fluticasone (FLONASE) 50 MCG/ACT nasal spray USE 2 SPRAYS IN EACH NOSTRIL DAILY. PT NEEDS TO SCHEDULE A FOLLOW UP APPT BEFORE NEXT REFILL. 16 g 0  . furosemide (LASIX) 40 MG tablet Take 1.5 tablets (60 mg total) by mouth daily. 45 tablet 3  . hydrALAZINE (APRESOLINE) 25 MG tablet TAKE 1 TABLET (25 MG TOTAL) BY MOUTH 3 (THREE) TIMES DAILY. 90 tablet 11  . metoprolol tartrate (LOPRESSOR) 25 MG tablet Take 1 tablet (25 mg total) by mouth 2 (two) times daily. 60 tablet 11  . potassium chloride SA (K-DUR,KLOR-CON) 20 MEQ tablet Take 1 tablet (20 mEq total) by mouth daily. 30 tablet 6  . ramipril (ALTACE) 10 MG capsule TAKE ONE CAPSULE BY MOUTH TWICE A DAY 60 capsule 10  . warfarin (COUMADIN) 5 MG tablet TAKE AS DIRECTED BY ANTICOAGULATION CLINIC 90 tablet 1   No current facility-administered medications for this visit.      Past Medical History:  Diagnosis Date  . Allergic rhinitis   . Aortic valve disorders   . Asthma   . Hyperlipidemia   . Hypertension   . Long term (current) use of anticoagulants   . Other primary cardiomyopathies ([place])   . Personal history of colonic polyps   . Personal history of venous thrombosis and embolism     Past Surgical History:  Procedure Laterality Date  . ABDOMINAL HYSTERECTOMY    . CARDIAC CATHETERIZATION    . DILATION AND CURETTAGE  OF UTERUS     several  . Rt knee arthoscopic    . TEE WITHOUT CARDIOVERSION N/A 02/21/2013   Procedure: TRANSESOPHAGEAL ECHOCARDIOGRAM (TEE);  Surgeon: Lelon Perla, MD;  Location: [facility] END[facility]COPY;  Service: Cardiovascular;  Laterality: N/A;    Social History   Social History  . Marital status: Legally Separated    Spouse name: N/A  . Number of children: N/A  . Years of education: N/A   Occupational History  . Not on file.   Social History Main Topics  . Smoking status: Former Smoker    Packs/day: 0.25    Quit date: 10/21/2013  . Smokeless tobacco: Not on file  . Alcohol use Yes     Comment: rare occasion  . Drug use: No  . Sexual activity: Not on file   Other Topics Concern  . 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    Divorced   Regular exercise- no   5 hours sleep    Lives with a son age 34    No pets          Family History  Problem Relation Age of Onset  . Alcohol abuse    . Colon cancer    . Depression    . Hyperlipidemia    . Hypertension    .  Kidney disease    . Uterine cancer    . Breast cancer      cousin  . Stroke      grandmother    ROS: no fevers or chills, productive cough, hemoptysis, dysphasia, odynophagia, melena, hematochezia, dysuria, hematuria, rash, seizure activity, orthopnea, PND, pedal edema, claudication. Remaining systems are negative.  Physical Exam: Well-developed well-nourished in no acute distress.  Skin is warm and dry.  HEENT is normal.  Neck is supple.  Chest is clear to auscultation with normal expansion.  Cardiovascular exam is regular rate and rhythm.  Abdominal exam nontender or distended. No masses palpated. Extremities show no edema. neuro grossly intact  ECG

## 2016-07-17 NOTE — Progress Notes (Signed)
HPI: FU nonischemic cardiomyopathy improved by most recent echocardiogram and a history of aortic insufficiency. Previous catheterization in 2004 showed no obstructive coronary disease. CT of her chest in Nov 2008 showed no thoracic aneurysm. Previous Holter monitor secondary to "dizzy spells" showed PACs and PVCs. Nuclear study April 2014 showed an ejection fraction of 62% and normal perfusion. Patient underwent transesophageal echocardiogram in May of 2014. Her ejection fraction was 50-55%. There was moderate aortic insufficiency and mild mitral regurgitation. There was a linear density associated with the atrial septum of uncertain etiology. Last echocardiogram in April of 2017 showed normal LV systolic function; grade 1 DD; moderate to severe AI; mild MR. Since I last saw her, she has some dyspnea on exertion but no orthopnea or PND, pedal edema, chest pain or syncope.   Current Outpatient Prescriptions  Medication Sig Dispense Refill  . albuterol (PROVENTIL HFA;VENTOLIN HFA) 108 (90 Base) MCG/ACT inhaler Inhale 2 puffs into the lungs every 6 (six) hours as needed for wheezing. 1 Inhaler 2  . amlodipine-atorvastatin (CADUET) 10-20 MG tablet Take 1 tablet by mouth daily. 30 tablet 11  . cholecalciferol (VITAMIN D) 1000 UNITS tablet Take 1,000 Units by mouth daily.      . fexofenadine (ALLEGRA) 180 MG tablet Take 180 mg by mouth daily.    . fluticasone (FLONASE) 50 MCG/ACT nasal spray USE 2 SPRAYS IN EACH NOSTRIL DAILY. PT NEEDS TO SCHEDULE A FOLLOW UP APPT BEFORE NEXT REFILL. 16 g 0  . furosemide (LASIX) 40 MG tablet Take 1.5 tablets (60 mg total) by mouth daily. 45 tablet 3  . hydrALAZINE (APRESOLINE) 25 MG tablet TAKE 1 TABLET (25 MG TOTAL) BY MOUTH 3 (THREE) TIMES DAILY. 90 tablet 11  . metoprolol tartrate (LOPRESSOR) 25 MG tablet Take 1 tablet (25 mg total) by mouth 2 (two) times daily. 60 tablet 11  . potassium chloride SA (K-DUR,KLOR-CON) 20 MEQ tablet Take 1 tablet (20 mEq total) by  mouth daily. 30 tablet 6  . ramipril (ALTACE) 10 MG capsule TAKE ONE CAPSULE BY MOUTH TWICE A DAY 60 capsule 10  . warfarin (COUMADIN) 5 MG tablet TAKE AS DIRECTED BY ANTICOAGULATION CLINIC 90 tablet 1   No current facility-administered medications for this visit.      Past Medical History:  Diagnosis Date  . Allergic rhinitis   . Aortic valve disorders   . Asthma   . Hyperlipidemia   . Hypertension   . Long term (current) use of anticoagulants   . Other primary cardiomyopathies   . Personal history of colonic polyps   . Personal history of venous thrombosis and embolism     Past Surgical History:  Procedure Laterality Date  . ABDOMINAL HYSTERECTOMY    . CARDIAC CATHETERIZATION    . DILATION AND CURETTAGE OF UTERUS     several  . Rt knee arthoscopic    . TEE WITHOUT CARDIOVERSION N/A 02/21/2013   Procedure: TRANSESOPHAGEAL ECHOCARDIOGRAM (TEE);  Surgeon: Lelon Perla, MD;  Location: [facility] ENDOSCOPY;  Service: Cardiovascular;  Laterality: N/A;    Social History   Social History  . Marital status: Legally Separated    Spouse name: N/A  . Number of children: N/A  . Years of education: N/A   Occupational History  . Not on file.   Social History Main Topics  . Smoking status: Former Smoker    Packs/day: 0.25    Quit date: 10/21/2013  . Smokeless tobacco: Never Used  . Alcohol use Yes  Comment: rare occasion  . Drug use: No  . Sexual activity: Not on file   Other Topics Concern  . 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 Alcohol abuse    . Colon cancer    . Depression    . Hyperlipidemia    . Hypertension    . Kidney disease    . Uterine cancer    . Breast cancer      cousin  . Stroke      grandmother    ROS: Some residual shoulder pain from recent  shoulder surgery but no fevers or chills, productive cough, hemoptysis, dysphasia, odynophagia, melena, hematochezia, dysuria, hematuria, rash, seizure activity, orthopnea, PND, pedal edema, claudication. Remaining systems are negative.  Physical Exam: Well-developed well-nourished in no acute distress.  Skin is warm and dry.  HEENT is normal.  Neck is supple.  Chest is clear to auscultation with normal expansion.  Cardiovascular exam is regular rate and rhythm. 2/6 systolic murmur Abdominal exam nontender or distended. No masses palpated. Extremities show no edema. neuro grossly intact  ECG-sinus bradycardia at a rate of 48. Left ventricular hypertrophy. Nonspecific T-wave changes.  A/P  1 aortic insufficiency-she will need a follow-up echocardiogram in July 2018. Watch for left ventricular dilatation or reduced LV function.  2 nonischemic cardiomyopathy-LV function has normalized. Continue ACE inhibitor and beta blocker.  3 recurrent DVTs-patient is on chronic Coumadin for this.  4 hypertension-blood pressure controlled. Continue present medications.  5 hyperlipidemia-continue statin.  6 edema-no edema on examination today. Continue present dose of Lasix. Check potassium and renal function. Check BNP.  Kirk Ruths, MD

## 2016-07-26 ENCOUNTER — Other Ambulatory Visit (INDEPENDENT_AMBULATORY_CARE_PROVIDER_SITE_OTHER): Payer: PRIVATE HEALTH INSURANCE

## 2016-07-26 ENCOUNTER — Ambulatory Visit: Payer: PRIVATE HEALTH INSURANCE | Admitting: Cardiology

## 2016-07-26 ENCOUNTER — Ambulatory Visit (INDEPENDENT_AMBULATORY_CARE_PROVIDER_SITE_OTHER): Payer: PRIVATE HEALTH INSURANCE | Admitting: Cardiology

## 2016-07-26 ENCOUNTER — Encounter: Payer: Self-pay | Admitting: Cardiology

## 2016-07-26 VITALS — BP 132/83 | HR 48 | Ht 66.0 in | Wt 217.8 lb

## 2016-07-26 DIAGNOSIS — I428 Other cardiomyopathies: Secondary | ICD-10-CM

## 2016-07-26 DIAGNOSIS — I10 Essential (primary) hypertension: Secondary | ICD-10-CM | POA: Diagnosis not present

## 2016-07-26 DIAGNOSIS — I351 Nonrheumatic aortic (valve) insufficiency: Secondary | ICD-10-CM | POA: Diagnosis not present

## 2016-07-26 LAB — CBC
HEMATOCRIT: 44 % (ref 35.0–45.0)
HEMOGLOBIN: 14.6 g/dL (ref 11.7–15.5)
MCH: 28.7 pg (ref 27.0–33.0)
MCHC: 33.2 g/dL (ref 32.0–36.0)
MCV: 86.4 fL (ref 80.0–100.0)
MPV: 11.7 fL (ref 7.5–12.5)
Platelets: 248 10*3/uL (ref 140–400)
RBC: 5.09 MIL/uL (ref 3.80–5.10)
RDW: 13.9 % (ref 11.0–15.0)
WBC: 8.8 10*3/uL (ref 3.8–10.8)

## 2016-07-26 LAB — BASIC METABOLIC PANEL
BUN: 13 mg/dL (ref 7–25)
CALCIUM: 9.2 mg/dL (ref 8.6–10.4)
CHLORIDE: 105 mmol/L (ref 98–110)
CO2: 23 mmol/L (ref 20–31)
Creat: 1.03 mg/dL — ABNORMAL HIGH (ref 0.50–0.99)
GLUCOSE: 110 mg/dL — AB (ref 65–99)
POTASSIUM: 3.7 mmol/L (ref 3.5–5.3)
SODIUM: 141 mmol/L (ref 135–146)

## 2016-07-26 NOTE — Patient Instructions (Signed)

## 2016-08-04 ENCOUNTER — Ambulatory Visit: Payer: PRIVATE HEALTH INSURANCE

## 2016-10-20 ENCOUNTER — Telehealth: Payer: Self-pay | Admitting: General Practice

## 2016-10-20 NOTE — Telephone Encounter (Signed)
LMOVM

## 2016-11-25 ENCOUNTER — Other Ambulatory Visit: Payer: Self-pay | Admitting: Internal Medicine

## 2016-11-30 ENCOUNTER — Other Ambulatory Visit: Payer: Self-pay | Admitting: General Practice

## 2016-11-30 MED ORDER — WARFARIN SODIUM 5 MG PO TABS: ORAL_TABLET | ORAL | 0 refills | Status: DC

## 2016-12-30 ENCOUNTER — Other Ambulatory Visit: Payer: Self-pay | Admitting: Cardiology

## 2016-12-31 ENCOUNTER — Other Ambulatory Visit: Payer: Self-pay | Admitting: Cardiology

## 2017-05-07 NOTE — Progress Notes (Deleted)
No chief complaint on file.   HPI: Patient  [name] Foster  66 y.o. comes in today for Preventive Health Care visit   Health Maintenance  Topic Date Due  . TETANUS/TDAP  05/29/1971  . INFLUENZA VACCINE  05/02/2017  . COLONOSCOPY  06/25/2017  . MAMMOGRAM  02/24/2018  . Hepatitis C Screening  Completed  . HIV Screening  Completed   Health Maintenance Review LIFESTYLE:  Exercise:   Tobacco/ETS: Alcohol:  Sugar beverages: Sleep: Drug use: no HH of  Work:    ROS:  GEN/ HEENT: No fever, significant weight changes sweats headaches vision problems hearing changes, CV/ PULM; No chest pain shortness of breath cough, syncope,edema  change in exercise tolerance. GI /GU: No adominal pain, vomiting, change in bowel habits. No blood in the stool. No significant GU symptoms. SKIN/HEME: ,no acute skin rashes suspicious lesions or bleeding. No lymphadenopathy, nodules, masses.  NEURO/ PSYCH:  No neurologic signs such as weakness numbness. No depression anxiety. IMM/ Allergy: No unusual infections.  Allergy .   REST of 12 system review negative except as per HPI   Past Medical History:  Diagnosis Date  . Allergic rhinitis   . Aortic valve disorders   . Asthma   . Hyperlipidemia   . Hypertension   . Long term (current) use of anticoagulants   . Other primary cardiomyopathies   . Personal history of colonic polyps   . Personal history of venous thrombosis and embolism     Past Surgical History:  Procedure Laterality Date  . ABDOMINAL HYSTERECTOMY    . CARDIAC CATHETERIZATION    . DILATION AND CURETTAGE OF UTERUS     several  . Rt knee arthoscopic    . TEE WITHOUT CARDIOVERSION N/A 02/21/2013   Procedure: TRANSESOPHAGEAL ECHOCARDIOGRAM (TEE);  Surgeon: Lelon Perla, MD;  Location: [facility] ENDOSCOPY;  Service: Cardiovascular;  Laterality: N/A;    Family History  Problem Relation Age of Onset  . Alcohol abuse Unknown   . Colon cancer Unknown   . Depression Unknown   .  Hyperlipidemia Unknown   . Hypertension Unknown   . Kidney disease Unknown   . Uterine cancer Unknown   . Breast cancer Unknown        cousin  . Stroke Unknown        grandmother    Social History   Social History  . Marital status: Legally Separated    Spouse name: N/A  . Number of children: N/A  . Years of education: N/A   Social History Main Topics  . Smoking status: Former Smoker    Packs/day: 0.25    Quit date: 10/21/2013  . Smokeless tobacco: Never Used  . Alcohol use Yes     Comment: rare occasion  . Drug use: No  . Sexual activity: Not on file   Other Topics Concern  . 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    Divorced   Regular exercise- no   5 hours sleep    Lives with a son age 56    No pets          Outpatient Medications Prior to Visit  Medication Sig Dispense Refill  . albuterol (PROVENTIL HFA;VENTOLIN HFA) 108 (90 Base) MCG/ACT inhaler Inhale 2 puffs into the lungs every 6 (six) hours as needed for wheezing. 1 Inhaler 2  . amlodipine-atorvastatin (CADUET) 10-20 MG tablet TAKE 1 TABLET BY MOUTH EVERY DAY 30 tablet 3  .  cholecalciferol (VITAMIN D) 1000 UNITS tablet Take 1,000 Units by mouth daily.      . fexofenadine (ALLEGRA) 180 MG tablet Take 180 mg by mouth daily.    . fluticasone (FLONASE) 50 MCG/ACT nasal spray USE 2 SPRAYS IN EACH NOSTRIL DAILY. PT NEEDS TO SCHEDULE A FOLLOW UP APPT BEFORE NEXT REFILL. 16 g 0  . furosemide (LASIX) 40 MG tablet Take 1.5 tablets (60 mg total) by mouth daily. 45 tablet 3  . hydrALAZINE (APRESOLINE) 25 MG tablet TAKE 1 TABLET (25 MG TOTAL) BY MOUTH 3 (THREE) TIMES DAILY. 90 tablet 10  . metoprolol tartrate (LOPRESSOR) 25 MG tablet TAKE 1 TABLET BY MOUTH TWICE A DAY 60 tablet 3  . potassium chloride SA (K-DUR,KLOR-CON) 20 MEQ tablet Take 1 tablet (20 mEq total) by mouth daily. 30 tablet 6  . ramipril (ALTACE) 10 MG capsule TAKE ONE CAPSULE BY MOUTH TWICE A DAY 60 capsule  10  . warfarin (COUMADIN) 5 MG tablet TAKE AS DIRECTED BY ANTICOAGULATION CLINIC 30 tablet 0   No facility-administered medications prior to visit.      EXAM:  There were no vitals taken for this visit.  There is no height or weight on file to calculate BMI. Wt Readings from Last 3 Encounters:  07/26/16 217 lb 12.8 oz (98.8 kg)  01/26/16 225 lb 12.8 oz (102.4 kg)  12/16/15 227 lb (103 kg)    Physical Exam: Vital signs reviewed KDX:IPJA is a well-developed well-nourished alert cooperative    who appearsr stated age in no acute distress.  HEENT: normocephalic atraumatic , Eyes: PERRL EOM's full, conjunctiva clear, Nares: paten,t no deformity discharge or tenderness., Ears: no deformity EAC's clear TMs with normal landmarks. Mouth: clear OP, no lesions, edema.  Moist mucous membranes. Dentition in adequate repair. NECK: supple without masses, thyromegaly or bruits. CHEST/PULM:  Clear to auscultation and percussion breath sounds equal no wheeze , rales or rhonchi. No chest wall deformities or tenderness. Breast: normal by inspection . No dimpling, discharge, masses, tenderness or discharge . CV: PMI is nondisplaced, S1 S2 no gallops, murmurs, rubs. Peripheral pulses are full without delay.No JVD .  ABDOMEN: Bowel sounds normal nontender  No guard or rebound, no hepato splenomegal no CVA tenderness.  No hernia. Extremtities:  No clubbing cyanosis or edema, no acute joint swelling or redness no focal atrophy NEURO:  Oriented x3, cranial nerves 3-12 appear to be intact, no obvious focal weakness,gait within normal limits no abnormal reflexes or asymmetrical SKIN: No acute rashes normal turgor, color, no bruising or petechiae. PSYCH: Oriented, good eye contact, no obvious depression anxiety, cognition and judgment appear normal. LN: no cervical axillary inguinal adenopathy  Lab Results  Component Value Date   WBC 8.8 07/26/2016   HGB 14.6 07/26/2016   HCT 44.0 07/26/2016   PLT 248  07/26/2016   GLUCOSE 110 (H) 07/26/2016   CHOL 147 12/16/2015   TRIG 118 12/16/2015   HDL 45 (L) 12/16/2015   LDLDIRECT 175.9 07/01/2007   LDLCALC 78 12/16/2015   ALT 18 12/16/2015   AST 18 12/16/2015   NA 141 07/26/2016   K 3.7 07/26/2016   CL 105 07/26/2016   CREATININE 1.03 (H) 07/26/2016   BUN 13 07/26/2016   CO2 23 07/26/2016   TSH 1.27 01/26/2016   INR 2.2 06/23/2016    BP Readings from Last 3 Encounters:  07/26/16 132/83  01/26/16 126/76  12/16/15 122/74    Lab results reviewed with patient   ASSESSMENT AND PLAN:  Discussed  the following assessment and plan:  Visit for preventive health examination  Medication management  Fasting hyperglycemia  Patient Care Team: Kache Mcclurg, Standley Brooking, MD as PCP - General Crenshaw, Denice Bors, MD (Cardiology) Susa Day, MD (Orthopedic Surgery) There are no Patient Instructions on file for this visit.  Standley Brooking. Yahaira Bruski M.D.

## 2017-05-08 ENCOUNTER — Encounter: Payer: Self-pay | Admitting: Internal Medicine

## 2017-06-13 NOTE — Progress Notes (Signed)
HPI: FU nonischemic cardiomyopathy improved by most recent echocardiogram and a history of aortic insufficiency. Previous catheterization in 2004 showed no obstructive coronary disease. CT of her chest in Nov 2008 showed no thoracic aneurysm. Previous Holter monitor secondary to "dizzy spells" showed PACs and PVCs. Nuclear study April 2014 showed an ejection fraction of 62% and normal perfusion. Patient underwent transesophageal echocardiogram in May of 2014. Her ejection fraction was 50-55%. There was moderate aortic insufficiency and mild mitral regurgitation. There was a linear density associated with the atrial septum of uncertain etiology. Last echocardiogram in April of 2017 showed normal LV systolic function; grade 1 DD; moderate to severe AI; mild MR. Since I last saw her, patient has not taken her medications since May because of financial issues. She has some dyspnea on exertion. She notices increased cough with lying flat. Some chest tightness with climbing to the top of the stairs. No palpitations or syncope.  Current Outpatient Prescriptions  Medication Sig Dispense Refill  . albuterol (PROVENTIL HFA;VENTOLIN HFA) 108 (90 Base) MCG/ACT inhaler Inhale 2 puffs into the lungs every 6 (six) hours as needed for wheezing. 1 Inhaler 2  . amlodipine-atorvastatin (CADUET) 10-20 MG tablet TAKE 1 TABLET BY MOUTH EVERY DAY 30 tablet 3  . cholecalciferol (VITAMIN D) 1000 UNITS tablet Take 1,000 Units by mouth daily.      . fexofenadine (ALLEGRA) 180 MG tablet Take 180 mg by mouth daily.    . fluticasone (FLONASE) 50 MCG/ACT nasal spray USE 2 SPRAYS IN EACH NOSTRIL DAILY. PT NEEDS TO SCHEDULE A FOLLOW UP APPT BEFORE NEXT REFILL. 16 g 0  . furosemide (LASIX) 40 MG tablet Take 1.5 tablets (60 mg total) by mouth daily. 45 tablet 3  . hydrALAZINE (APRESOLINE) 25 MG tablet TAKE 1 TABLET (25 MG TOTAL) BY MOUTH 3 (THREE) TIMES DAILY. 90 tablet 10  . metoprolol tartrate (LOPRESSOR) 25 MG tablet TAKE 1  TABLET BY MOUTH TWICE A DAY 60 tablet 3  . potassium chloride SA (K-DUR,KLOR-CON) 20 MEQ tablet Take 1 tablet (20 mEq total) by mouth daily. 30 tablet 6  . ramipril (ALTACE) 10 MG capsule TAKE ONE CAPSULE BY MOUTH TWICE A DAY 60 capsule 10  . warfarin (COUMADIN) 5 MG tablet TAKE AS DIRECTED BY ANTICOAGULATION CLINIC 30 tablet 0   No current facility-administered medications for this visit.      Past Medical History:  Diagnosis Date  . Allergic rhinitis   . Aortic valve disorders   . Asthma   . Hyperlipidemia   . Hypertension   . Long term (current) use of anticoagulants   . Other primary cardiomyopathies   . Personal history of colonic polyps   . Personal history of venous thrombosis and embolism     Past Surgical History:  Procedure Laterality Date  . ABDOMINAL HYSTERECTOMY    . CARDIAC CATHETERIZATION    . DILATION AND CURETTAGE OF UTERUS     several  . Rt knee arthoscopic    . TEE WITHOUT CARDIOVERSION N/A 02/21/2013   Procedure: TRANSESOPHAGEAL ECHOCARDIOGRAM (TEE);  Surgeon: Lelon Perla, MD;  Location: [facility] ENDOSCOPY;  Service: Cardiovascular;  Laterality: N/A;    Social History   Social History  . Marital status: Legally Separated    Spouse name: N/A  . Number of children: N/A  . Years of education: N/A   Occupational History  . Not on file.   Social History Main Topics  . Smoking status: Former Smoker    Packs/day: 0.25  Quit date: 10/21/2013  . Smokeless tobacco: Never Used  . Alcohol use Yes     Comment: rare occasion  . Drug use: No  . Sexual activity: Not on file   Other Topics Concern  . 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 Alcohol abuse Unknown   . Colon cancer Unknown   . Depression Unknown   . Hyperlipidemia Unknown   . Hypertension  Unknown   . Kidney disease Unknown   . Uterine cancer Unknown   . Breast cancer Unknown        cousin  . Stroke Unknown        grandmother    ROS: no fevers or chills, productive cough, hemoptysis, dysphasia, odynophagia, melena, hematochezia, dysuria, hematuria, rash, seizure activity, orthopnea, PND, pedal edema, claudication. Remaining systems are negative.  Physical Exam: Well-developed well-nourished in no acute distress.  Skin is warm and dry.  HEENT is normal.  Neck is supple.  Chest is clear to auscultation with normal expansion.  Cardiovascular exam is regular rate and rhythm.  Abdominal exam nontender or distended. No masses palpated. Extremities show no edema. neuro grossly intact  ECG- sinus rhythm at a rate of 76. Left axis deviation. Left ventricular hypertrophy with repolarization abnormality. personally reviewed  A/P  1 aortic insufficiency-plan repeat echocardiogram when BP better controlled (6-8 weeks after resuming meds).  2 hypertension-blood pressure is elevated; however, she has not taken meds in 4 months. Resume amlodipine 10 mg daily, Altace 10 mg daily, metoprolol 25 mg twice a day, Lasix 40 mg daily, potassium 20 mCi daily. Follow blood pressure and adjust regimen as needed. She was on low-dose hydralazine in the past which could be resumed if blood pressure not controlled on above. We discussed the importance of compliance. Check potassium and renal function in 1 week.  3 history of nonischemic cardiomyopathy-plan to resume beta blocker and ACE inhibitor. Improved on most recent echocardiogram. Will repeat study in 6-8 weeks when back on meds and BP controlled.  4 hyperlipidemia-resume lipitor 20 mg daily; check lipids and liver in 8 weeks.  5 history of DVT-patient is not taking Coumadin. I have asked her to discuss this with primary care as she has a history of recurrent DVTs. She is scheduled to see them on October 2.  Kirk Ruths, MD

## 2017-06-22 ENCOUNTER — Encounter: Payer: Self-pay | Admitting: Internal Medicine

## 2017-06-25 ENCOUNTER — Ambulatory Visit (INDEPENDENT_AMBULATORY_CARE_PROVIDER_SITE_OTHER): Payer: Medicare Other | Admitting: Cardiology

## 2017-06-25 ENCOUNTER — Encounter: Payer: Self-pay | Admitting: Cardiology

## 2017-06-25 VITALS — BP 172/102 | HR 76 | Ht 65.5 in | Wt 202.4 lb

## 2017-06-25 DIAGNOSIS — I359 Nonrheumatic aortic valve disorder, unspecified: Secondary | ICD-10-CM | POA: Diagnosis not present

## 2017-06-25 DIAGNOSIS — I10 Essential (primary) hypertension: Secondary | ICD-10-CM

## 2017-06-25 DIAGNOSIS — I428 Other cardiomyopathies: Secondary | ICD-10-CM

## 2017-06-25 DIAGNOSIS — E7800 Pure hypercholesterolemia, unspecified: Secondary | ICD-10-CM | POA: Diagnosis not present

## 2017-06-25 MED ORDER — ATORVASTATIN CALCIUM 20 MG PO TABS: 20.0000 mg | ORAL_TABLET | Freq: Every day | ORAL | 3 refills | Status: DC

## 2017-06-25 MED ORDER — AMLODIPINE BESYLATE 10 MG PO TABS: 10.0000 mg | ORAL_TABLET | Freq: Every day | ORAL | 3 refills | Status: DC

## 2017-06-25 MED ORDER — FUROSEMIDE 40 MG PO TABS: 40.0000 mg | ORAL_TABLET | Freq: Every day | ORAL | 3 refills | Status: DC

## 2017-06-25 MED ORDER — RAMIPRIL 10 MG PO CAPS: 10.0000 mg | ORAL_CAPSULE | Freq: Every day | ORAL | 3 refills | Status: DC

## 2017-06-25 MED ORDER — POTASSIUM CHLORIDE CRYS ER 20 MEQ PO TBCR: 20.0000 meq | EXTENDED_RELEASE_TABLET | Freq: Every day | ORAL | 3 refills | Status: DC

## 2017-06-25 MED ORDER — METOPROLOL TARTRATE 25 MG PO TABS: 25.0000 mg | ORAL_TABLET | Freq: Two times a day (BID) | ORAL | 3 refills | Status: DC

## 2017-06-25 NOTE — Patient Instructions (Signed)
Medication Instructions:   RESTART AMLODIPINE 10 MG ONCE DAILY  ATORVASTATIN 20 MG ONCE DAILY  FUROSEMIDE 40 MG ONCE DAILY  METOPROLOL TARTRATE 25 MG TWICE DAILY  POTASSIUM 20 MEQ ONCE DAILY  RAMIPRIL 10 MG ONCE DAILY  Labwork:  Your physician recommends that you return for lab work in:ONE WEEK  Testing/Procedures:  Your physician has requested that you have an echocardiogram. Echocardiography is a painless test that uses sound waves to create images of your heart. It provides your doctor with information about the size and shape of your heart and how well your heart's chambers and valves are working. This procedure takes approximately one hour. There are no restrictions for this procedure.SCHEDULE IN 8 WEEKS    Follow-Up:  Your physician recommends that you schedule a follow-up appointment in: AFTER ECHO WITH APP  Your physician wants you to follow-up in: [place] will receive a reminder letter in the mail two months in advance. If you don't receive a letter, please call our office to schedule the follow-up appointment.   If you need a refill on your cardiac medications before your next appointment, please call your pharmacy.

## 2017-06-29 NOTE — Addendum Note (Signed)
Addended by: Milderd Meager on: 06/29/2017 11:45 AM   Modules accepted: Orders

## 2017-07-02 NOTE — Progress Notes (Signed)
Chief Complaint  Patient presents with  . Annual Exam    Pt states she has been doing well    HPI: [name] Foster 66 y.o. comes in today for Preventive Medicare exam/ welcome to [place] visit  Last preventive visit was April 2017.she was off of insurance for about 4-6 months and stopped her Coumadin in May. She has a history of recurrent DVT and family history of strong clotting and was told that she should be on anticoagulation the rest of her life for prevention. She sees Dr. Stanford Breed for her and ICM. She has a history of recurrent DVT and AVD.Since last visit. Was off insurance and now on medicare .    Health Maintenance  Topic Date Due  . DEXA SCAN  05/28/2017  . COLONOSCOPY  06/25/2017  . TETANUS/TDAP  07/04/2027 (Originally 05/29/1970)  . MAMMOGRAM  02/24/2018  . PNA vac Low Risk Adult (2 of 2 - PPSV23) 07/03/2018  . INFLUENZA VACCINE  Completed  . Hepatitis C Screening  Completed  . HIV Screening  Completed   Health Maintenance Review LIFESTYLE:  Exercise:   Not  Really .  Tobacco/ETS: no Alcohol:  ocass Sugar beverages: pepsi 32 oz  Sleep:  7 hours   Drug use: no no opiods HH:  4   No pets  No working  At this time.     Hearing:  Ok   Vision:  No limitations at present . Last eye check UTD  Safety:  Has smoke detector and wears seat belts.  No firearms. No excess sun exposure. Sees dentist regularly.  Falls: once slipped down steps   Advance directive :  Reviewed  Not yet.   Memory: Felt to be good  , no concern from her or her family.  Depression: No anhedonia unusual crying or depressive symptoms    Nutrition: Eats well balanced diet; adequate calcium and vitamin D. No swallowing chewing problems.  Injury: no major injuries in the last six months.  Other healthcare providers:  Reviewed today .  Social: hh of 4     Preventive parameters: up-to-date  Reviewed   ADLS:   There are no problems or need for assistance  driving, feeding,  obtaining food, dressing, toileting and bathing, managing money using phone. She is independent.    ROS:  GEN/ HEENT: No fever, significant weight changes sweats headaches vision problems hearing changes, CV/ PULM; No chest pain shortness of breath cough, syncope,edema  change in exercise tolerance. GI /GU: No adominal pain, vomiting, change in bowel habits. No blood in the stool. No significant GU symptoms. SKIN/HEME: ,no acute skin rashes suspicious lesions or bleeding. No lymphadenopathy, nodules, masses.  NEURO/ PSYCH:  No neurologic signs such as weakness numbness. No depression anxiety. IMM/ Allergy: No unusual infections.  Allergy .   REST of 12 system review negative except as per HPI   Past Medical History:  Diagnosis Date  . Allergic rhinitis   . Aortic valve disorders   . Asthma   . Hyperlipidemia   . Hypertension   . Long term (current) use of anticoagulants   . Other primary cardiomyopathies   . Personal history of colonic polyps   . Personal history of venous thrombosis and embolism     Family History  Problem Relation Age of Onset  . Alcohol abuse Unknown   . Colon cancer Unknown   . Depression Unknown   . Hyperlipidemia Unknown   . Hypertension Unknown   . Kidney disease Unknown   .  Uterine cancer Unknown   . Breast cancer Unknown        cousin  . Stroke Unknown        grandmother    Social History   Social History  . Marital status: Legally Separated    Spouse name: N/A  . Number of children: N/A  . Years of education: N/A   Social History Main Topics  . Smoking status: Former Smoker    Packs/day: 0.25    Quit date: 10/21/2013  . Smokeless tobacco: Never Used  . Alcohol use Yes     Comment: rare occasion  . Drug use: No  . Sexual activity: Not Asked   Other Topics Concern  . 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    Divorced   Regular exercise- no   5 hours sleep    Lives with a son age 46     No pets          Outpatient Encounter Prescriptions as of 07/03/2017  Medication Sig  . albuterol (PROVENTIL HFA;VENTOLIN HFA) 108 (90 Base) MCG/ACT inhaler Inhale 2 puffs into the lungs every 6 (six) hours as needed for wheezing.  Marland Kitchen amLODipine (NORVASC) 10 MG tablet Take 1 tablet (10 mg total) by mouth daily.  Marland Kitchen atorvastatin (LIPITOR) 20 MG tablet Take 1 tablet (20 mg total) by mouth daily.  . cholecalciferol (VITAMIN D) 1000 UNITS tablet Take 1,000 Units by mouth daily.    . fexofenadine (ALLEGRA) 180 MG tablet Take 180 mg by mouth daily.  . fluticasone (FLONASE) 50 MCG/ACT nasal spray USE 2 SPRAYS IN EACH NOSTRIL DAILY. PT NEEDS TO SCHEDULE A FOLLOW UP APPT BEFORE NEXT REFILL.  . furosemide (LASIX) 40 MG tablet Take 1 tablet (40 mg total) by mouth daily.  . metoprolol tartrate (LOPRESSOR) 25 MG tablet Take 1 tablet (25 mg total) by mouth 2 (two) times daily.  . potassium chloride SA (K-DUR,KLOR-CON) 20 MEQ tablet Take 1 tablet (20 mEq total) by mouth daily.  . ramipril (ALTACE) 10 MG capsule Take 1 capsule (10 mg total) by mouth daily.  . hydrALAZINE (APRESOLINE) 25 MG tablet TAKE 1 TABLET (25 MG TOTAL) BY MOUTH 3 (THREE) TIMES DAILY. (Patient not taking: Reported on 07/03/2017)  . warfarin (COUMADIN) 5 MG tablet TAKE AS DIRECTED BY ANTICOAGULATION CLINIC  . [DISCONTINUED] warfarin (COUMADIN) 5 MG tablet TAKE AS DIRECTED BY ANTICOAGULATION CLINIC (Patient not taking: Reported on 07/03/2017)   No facility-administered encounter medications on file as of 07/03/2017.     EXAM:  BP 132/82 (BP Location: Right Arm, Patient Position: Sitting, Cuff Size: Large)   Pulse 61   Temp 97.8 F (36.6 C) (Oral)   Ht 5' 5.5" (1.664 m)   Wt 200 lb 3.2 oz (90.8 kg)   SpO2 97%   BMI 32.81 kg/m   Body mass index is 32.81 kg/m.  Physical Exam: Vital signs reviewed HER:DEYC is a well-developed well-nourished alert cooperative   who appears stated age in no acute distress.  HEENT: normocephalic  atraumatic , Eyes: PERRL EOM's full, conjunctiva clear, Nares: paten,t no deformity discharge or tenderness., Ears: no deformity EAC's clear TMs with normal landmarks. Mouth: clear OP, no lesions, edema.  Moist mucous membranes. Dentition in adequate repair. NECK: supple without masses, thyromegaly or bruits. CHEST/PULM:  Clear to auscultation and percussion breath sounds equal no wheeze , rales or rhonchi. No chest wall deformities or tenderness. CV: PMI is nondisplaced, S1 S2 no gallops, murmurs,  rubs. Peripheral pulses are full without delay.No JVD . Breast: normal by inspection . No dimpling, discharge, masses, tenderness or discharge . ABDOMEN: Bowel sounds normal nontender  No guard or rebound, no hepato splenomegal no CVA tenderness.   Extremtities:  No clubbing cyanosis or edema, no acute joint swelling or redness no focal atrophy NEURO:  Oriented x3, cranial nerves 3-12 appear to be intact, no obvious focal weakness,gait within normal limits no abnormal reflexes or asymmetrical SKIN: No acute rashes normal turgor, color, no bruising or petechiae. PSYCH: Oriented, good eye contact, no obvious depression anxiety, cognition and judgment appear normal. LN: no cervical axillary inguinal adenopathy No noted deficits in memory, attention, and speech.   Lab Results  Component Value Date   WBC 8.8 07/26/2016   HGB 14.6 07/26/2016   HCT 44.0 07/26/2016   PLT 248 07/26/2016   GLUCOSE 110 (H) 07/26/2016   CHOL 147 12/16/2015   TRIG 118 12/16/2015   HDL 45 (L) 12/16/2015   LDLDIRECT 175.9 07/01/2007   LDLCALC 78 12/16/2015   ALT 18 12/16/2015   AST 18 12/16/2015   NA 141 07/26/2016   K 3.7 07/26/2016   CL 105 07/26/2016   CREATININE 1.03 (H) 07/26/2016   BUN 13 07/26/2016   CO2 23 07/26/2016   TSH 1.27 01/26/2016   INR 2.2 06/23/2016    ASSESSMENT AND PLAN:  Discussed the following assessment and plan:  Welcome to Medicare preventive visit  Cardiomyopathy, hypertensive, without  heart failure ([place]) - Plan: Comprehensive metabolic panel, Lipid panel, CBC with Differential/Platelet, Hemoglobin A1c  Hyperlipidemia, unspecified hyperlipidemia type - Plan: Comprehensive metabolic panel, Lipid panel, CBC with Differential/Platelet, Hemoglobin A1c  Fasting hyperglycemia - Plan: Comprehensive metabolic panel, Lipid panel, CBC with Differential/Platelet, Hemoglobin A1c  Family history of colon cancer - Plan: Comprehensive metabolic panel, Lipid panel, CBC with Differential/Platelet, Hemoglobin A1c  Medication management - Plan: Comprehensive metabolic panel, Lipid panel, CBC with Differential/Platelet, Hemoglobin A1c  Chronic anticoagulation - off med for months no insurance  DVT, HX OF  Need for prophylactic vaccination and inoculation against influenza - Plan: Flu vaccine HIGH DOSE PF (Fluzone High dose)  Patient's intentional underdosing of medication regimen due to financial hardship Her INR had been in range for a long time at 5 mg a day previously will restart at same dose and follow-up in local Coumadin clinic at 5 mg. Previously she was getting her monitoring off site which was problematic for her primary care team but was helpful when she was working because of location. Patient Care Team: Katryn Plummer, Standley Brooking, MD as PCP - General Stanford Breed, Denice Bors, MD (Cardiology) Susa Day, MD (Orthopedic Surgery)  Patient Instructions   lifestyle intervention healthy eating and exercise . Decrease or stop the sugar beverages .  pneumovax next year  Get your mammogram and colonscopy  As discussed . To restart the Coumadin at 5 mg a day and check an INR in 1 week with Shelah Lewandowsky then as per protocol.    Healthy lifestyle includes : At least 150 minutes of exercise weeks  , weight at healthy levels, which is usually   BMI 19-25. Avoid trans fats and processed foods;  Increase fresh fruits and veges to 5 servings per day. And avoid sweet beverages including tea and  juice. Mediterranean diet with olive oil and nuts have been noted to be heart and brain healthy . Avoid tobacco products . Limit  alcohol to  7 per week for women and 14 servings for men.  Get adequate sleep . Wear seat belts . Don't text and drive .     Preventive Care 23 Years and Older, Female Preventive care refers to lifestyle choices and visits with your health care provider that can promote health and wellness. What does preventive care include?  A yearly physical exam. This is also called an annual well check.  Dental exams once or twice a year.  Routine eye exams. Ask your health care provider how often you should have your eyes checked.  Personal lifestyle choices, including: ? Daily care of your teeth and gums. ? Regular physical activity. ? Eating a healthy diet. ? Avoiding tobacco and drug use. ? Limiting alcohol use. ? Practicing safe sex. ? Taking low-dose aspirin every day. ? Taking vitamin and mineral supplements as recommended by your health care provider. What happens during an annual well check? The services and screenings done by your health care provider during your annual well check will depend on your age, overall health, lifestyle risk factors, and family history of disease. Counseling Your health care provider may ask you questions about your:  Alcohol use.  Tobacco use.  Drug use.  Emotional well-being.  Home and relationship well-being.  Sexual activity.  Eating habits.  History of falls.  Memory and ability to understand (cognition).  Work and work Statistician.  Reproductive health.  Screening You may have the following tests or measurements:  Height, weight, and BMI.  Blood pressure.  Lipid and cholesterol levels. These may be checked every 5 years, or more frequently if you are over 51 years old.  Skin check.  Lung cancer screening. You may have this screening every year starting at age 52 if you have a 30-pack-year  history of smoking and currently smoke or have quit within the past 15 years.  Fecal occult blood test (FOBT) of the stool. You may have this test every year starting at age 38.  Flexible sigmoidoscopy or colonoscopy. You may have a sigmoidoscopy every 5 years or a colonoscopy every 10 years starting at age 34.  Hepatitis C blood test.  Hepatitis B blood test.  Sexually transmitted disease (STD) testing.  Diabetes screening. This is done by checking your blood sugar (glucose) after you have not eaten for a while (fasting). You may have this done every 1-3 years.  Bone density scan. This is done to screen for osteoporosis. You may have this done starting at age 62.  Mammogram. This may be done every 1-2 years. Talk to your health care provider about how often you should have regular mammograms.  Talk with your health care provider about your test results, treatment options, and if necessary, the need for more tests. Vaccines Your health care provider may recommend certain vaccines, such as:  Influenza vaccine. This is recommended every year.  Tetanus, diphtheria, and acellular pertussis (Tdap, Td) vaccine. You may need a Td booster every 10 years.  Varicella vaccine. You may need this if you have not been vaccinated.  Zoster vaccine. You may need this after age 29.  Measles, mumps, and rubella (MMR) vaccine. You may need at least one dose of MMR if you were born in 1957 or later. You may also need a second dose.  Pneumococcal 13-valent conjugate (PCV13) vaccine. One dose is recommended after age 57.  Pneumococcal polysaccharide (PPSV23) vaccine. One dose is recommended after age 65.  Meningococcal vaccine. You may need this if you have certain conditions.  Hepatitis A vaccine. You may need this if you have  certain conditions or if you travel or work in places where you may be exposed to hepatitis A.  Hepatitis B vaccine. You may need this if you have certain conditions or if  you travel or work in places where you may be exposed to hepatitis B.  Haemophilus influenzae type b (Hib) vaccine. You may need this if you have certain conditions.  Talk to your health care provider about which screenings and vaccines you need and how often you need them. This information is not intended to replace advice given to you by your health care provider. Make sure you discuss any questions you have with your health care provider. Document Released: 10/15/2015 Document Revised: 06/07/2016 Document Reviewed: 07/20/2015 Elsevier Interactive Patient Education  2017 [employer] K. Denard Tuminello M.D.

## 2017-07-03 ENCOUNTER — Ambulatory Visit (INDEPENDENT_AMBULATORY_CARE_PROVIDER_SITE_OTHER): Payer: Medicare Other | Admitting: Internal Medicine

## 2017-07-03 ENCOUNTER — Telehealth: Payer: Self-pay | Admitting: Internal Medicine

## 2017-07-03 VITALS — BP 132/82 | HR 61 | Temp 97.8°F | Ht 65.5 in | Wt 200.2 lb

## 2017-07-03 DIAGNOSIS — Z86718 Personal history of other venous thrombosis and embolism: Secondary | ICD-10-CM | POA: Diagnosis not present

## 2017-07-03 DIAGNOSIS — E785 Hyperlipidemia, unspecified: Secondary | ICD-10-CM

## 2017-07-03 DIAGNOSIS — I43 Cardiomyopathy in diseases classified elsewhere: Secondary | ICD-10-CM

## 2017-07-03 DIAGNOSIS — Z79899 Other long term (current) drug therapy: Secondary | ICD-10-CM

## 2017-07-03 DIAGNOSIS — I119 Hypertensive heart disease without heart failure: Secondary | ICD-10-CM

## 2017-07-03 DIAGNOSIS — Z800 Family history of malignant neoplasm of digestive organs: Secondary | ICD-10-CM

## 2017-07-03 DIAGNOSIS — Z23 Encounter for immunization: Secondary | ICD-10-CM

## 2017-07-03 DIAGNOSIS — Z0000 Encounter for general adult medical examination without abnormal findings: Secondary | ICD-10-CM

## 2017-07-03 DIAGNOSIS — R7301 Impaired fasting glucose: Secondary | ICD-10-CM | POA: Diagnosis not present

## 2017-07-03 DIAGNOSIS — Z91120 Patient's intentional underdosing of medication regimen due to financial hardship: Secondary | ICD-10-CM

## 2017-07-03 DIAGNOSIS — Z7901 Long term (current) use of anticoagulants: Secondary | ICD-10-CM | POA: Diagnosis not present

## 2017-07-03 LAB — COMPREHENSIVE METABOLIC PANEL
ALBUMIN: 4.5 g/dL (ref 3.5–5.2)
ALT: 10 U/L (ref 0–35)
AST: 12 U/L (ref 0–37)
Alkaline Phosphatase: 102 U/L (ref 39–117)
BUN: 20 mg/dL (ref 6–23)
CHLORIDE: 107 meq/L (ref 96–112)
CO2: 26 meq/L (ref 19–32)
Calcium: 9.7 mg/dL (ref 8.4–10.5)
Creatinine, Ser: 1.65 mg/dL — ABNORMAL HIGH (ref 0.40–1.20)
GFR: 40.14 mL/min — ABNORMAL LOW (ref >60.00)
GLUCOSE: 85 mg/dL (ref 70–99)
POTASSIUM: 4 meq/L (ref 3.5–5.1)
SODIUM: 143 meq/L (ref 135–145)
Total Bilirubin: 0.7 mg/dL (ref 0.2–1.2)
Total Protein: 7.6 g/dL (ref 6.0–8.3)

## 2017-07-03 LAB — LIPID PANEL
CHOL/HDL RATIO: 4
CHOLESTEROL: 192 mg/dL (ref 0–200)
HDL: 50.1 mg/dL (ref >39.00)
LDL CALC: 116 mg/dL — AB (ref 0–99)
NonHDL: 142.39
Triglycerides: 131 mg/dL (ref 0.0–149.0)
VLDL: 26.2 mg/dL (ref 0.0–40.0)

## 2017-07-03 MED ORDER — WARFARIN SODIUM 5 MG PO TABS: ORAL_TABLET | ORAL | 0 refills | Status: DC

## 2017-07-03 NOTE — Telephone Encounter (Signed)
Called pt to inform her that we need to draw a little more blood from her - left her a message to stop by at her convenience for that.  Told her to call back with any questions.

## 2017-07-03 NOTE — Patient Instructions (Addendum)
lifestyle intervention healthy eating and exercise . Decrease or stop the sugar beverages .  pneumovax next year  Get your mammogram and colonscopy  As discussed . To restart the Coumadin at 5 mg a day and check an INR in 1 week with Shelah Lewandowsky then as per protocol.    Healthy lifestyle includes : At least 150 minutes of exercise weeks  , weight at healthy levels, which is usually   BMI 19-25. Avoid trans fats and processed foods;  Increase fresh fruits and veges to 5 servings per day. And avoid sweet beverages including tea and juice. Mediterranean diet with olive oil and nuts have been noted to be heart and brain healthy . Avoid tobacco products . Limit  alcohol to  7 per week for women and 14 servings for men.  Get adequate sleep . Wear seat belts . Don't text and drive .     Preventive Care 20 Years and Older, Female Preventive care refers to lifestyle choices and visits with your health care provider that can promote health and wellness. What does preventive care include?  A yearly physical exam. This is also called an annual well check.  Dental exams once or twice a year.  Routine eye exams. Ask your health care provider how often you should have your eyes checked.  Personal lifestyle choices, including: ? Daily care of your teeth and gums. ? Regular physical activity. ? Eating a healthy diet. ? Avoiding tobacco and drug use. ? Limiting alcohol use. ? Practicing safe sex. ? Taking low-dose aspirin every day. ? Taking vitamin and mineral supplements as recommended by your health care provider. What happens during an annual well check? The services and screenings done by your health care provider during your annual well check will depend on your age, overall health, lifestyle risk factors, and family history of disease. Counseling Your health care provider may ask you questions about your:  Alcohol use.  Tobacco use.  Drug use.  Emotional well-being.  Home  and relationship well-being.  Sexual activity.  Eating habits.  History of falls.  Memory and ability to understand (cognition).  Work and work Statistician.  Reproductive health.  Screening You may have the following tests or measurements:  Height, weight, and BMI.  Blood pressure.  Lipid and cholesterol levels. These may be checked every 5 years, or more frequently if you are over 31 years old.  Skin check.  Lung cancer screening. You may have this screening every year starting at age 74 if you have a 30-pack-year history of smoking and currently smoke or have quit within the past 15 years.  Fecal occult blood test (FOBT) of the stool. You may have this test every year starting at age 70.  Flexible sigmoidoscopy or colonoscopy. You may have a sigmoidoscopy every 5 years or a colonoscopy every 10 years starting at age 38.  Hepatitis C blood test.  Hepatitis B blood test.  Sexually transmitted disease (STD) testing.  Diabetes screening. This is done by checking your blood sugar (glucose) after you have not eaten for a while (fasting). You may have this done every 1-3 years.  Bone density scan. This is done to screen for osteoporosis. You may have this done starting at age 75.  Mammogram. This may be done every 1-2 years. Talk to your health care provider about how often you should have regular mammograms.  Talk with your health care provider about your test results, treatment options, and if necessary, the need for more  tests. Vaccines Your health care provider may recommend certain vaccines, such as:  Influenza vaccine. This is recommended every year.  Tetanus, diphtheria, and acellular pertussis (Tdap, Td) vaccine. You may need a Td booster every 10 years.  Varicella vaccine. You may need this if you have not been vaccinated.  Zoster vaccine. You may need this after age 18.  Measles, mumps, and rubella (MMR) vaccine. You may need at least one dose of MMR if you  were born in 1957 or later. You may also need a second dose.  Pneumococcal 13-valent conjugate (PCV13) vaccine. One dose is recommended after age 37.  Pneumococcal polysaccharide (PPSV23) vaccine. One dose is recommended after age 73.  Meningococcal vaccine. You may need this if you have certain conditions.  Hepatitis A vaccine. You may need this if you have certain conditions or if you travel or work in places where you may be exposed to hepatitis A.  Hepatitis B vaccine. You may need this if you have certain conditions or if you travel or work in places where you may be exposed to hepatitis B.  Haemophilus influenzae type b (Hib) vaccine. You may need this if you have certain conditions.  Talk to your health care provider about which screenings and vaccines you need and how often you need them. This information is not intended to replace advice given to you by your health care provider. Make sure you discuss any questions you have with your health care provider. Document Released: 10/15/2015 Document Revised: 06/07/2016 Document Reviewed: 07/20/2015 Elsevier Interactive Patient Education  2017 [employer].

## 2017-07-04 ENCOUNTER — Encounter: Payer: Self-pay | Admitting: Internal Medicine

## 2017-07-05 ENCOUNTER — Telehealth: Payer: Self-pay | Admitting: Internal Medicine

## 2017-07-05 DIAGNOSIS — R739 Hyperglycemia, unspecified: Secondary | ICD-10-CM

## 2017-07-05 DIAGNOSIS — R7301 Impaired fasting glucose: Secondary | ICD-10-CM

## 2017-07-06 ENCOUNTER — Telehealth: Payer: Self-pay | Admitting: Internal Medicine

## 2017-07-06 NOTE — Telephone Encounter (Signed)
2nd attempt to reach pt about drawing more labs - lmom.  Also sent MyChart message.

## 2017-07-27 ENCOUNTER — Other Ambulatory Visit: Payer: Self-pay | Admitting: Internal Medicine

## 2017-07-27 DIAGNOSIS — I428 Other cardiomyopathies: Secondary | ICD-10-CM

## 2017-08-01 ENCOUNTER — Other Ambulatory Visit: Payer: Self-pay | Admitting: Internal Medicine

## 2017-08-01 DIAGNOSIS — Z7901 Long term (current) use of anticoagulants: Secondary | ICD-10-CM

## 2017-08-07 ENCOUNTER — Other Ambulatory Visit: Payer: Self-pay | Admitting: Internal Medicine

## 2017-08-07 ENCOUNTER — Other Ambulatory Visit (INDEPENDENT_AMBULATORY_CARE_PROVIDER_SITE_OTHER): Payer: Medicare Other

## 2017-08-07 DIAGNOSIS — R7301 Impaired fasting glucose: Secondary | ICD-10-CM | POA: Diagnosis not present

## 2017-08-07 DIAGNOSIS — R739 Hyperglycemia, unspecified: Secondary | ICD-10-CM

## 2017-08-07 DIAGNOSIS — Z7901 Long term (current) use of anticoagulants: Secondary | ICD-10-CM | POA: Diagnosis not present

## 2017-08-07 DIAGNOSIS — I428 Other cardiomyopathies: Secondary | ICD-10-CM

## 2017-08-07 LAB — HEMOGLOBIN A1C: HEMOGLOBIN A1C: 6.2 % (ref 4.6–6.5)

## 2017-08-07 LAB — CBC WITH DIFFERENTIAL/PLATELET
BASOS PCT: 0.6 % (ref 0.0–3.0)
Basophils Absolute: 0 10*3/uL (ref 0.0–0.1)
EOS ABS: 0.1 10*3/uL (ref 0.0–0.7)
Eosinophils Relative: 0.8 % (ref 0.0–5.0)
HEMATOCRIT: 44.9 % (ref 36.0–46.0)
HEMOGLOBIN: 14.7 g/dL (ref 12.0–15.0)
Lymphocytes Relative: 50.1 % — ABNORMAL HIGH (ref 12.0–46.0)
Lymphs Abs: 3.2 10*3/uL (ref 0.7–4.0)
MCHC: 32.8 g/dL (ref 30.0–36.0)
MCV: 87.4 fl (ref 78.0–100.0)
Monocytes Absolute: 0.3 10*3/uL (ref 0.1–1.0)
Monocytes Relative: 5.1 % (ref 3.0–12.0)
Neutro Abs: 2.8 10*3/uL (ref 1.4–7.7)
Neutrophils Relative %: 43.4 % (ref 43.0–77.0)
Platelets: 142 10*3/uL — ABNORMAL LOW (ref 150.0–400.0)
RBC: 5.14 Mil/uL — AB (ref 3.87–5.11)
RDW: 13.7 % (ref 11.5–15.5)
WBC: 6.5 10*3/uL (ref 4.0–10.5)

## 2017-08-07 LAB — BASIC METABOLIC PANEL
BUN: 15 mg/dL (ref 6–23)
CO2: 27 meq/L (ref 19–32)
Calcium: 9.6 mg/dL (ref 8.4–10.5)
Chloride: 105 mEq/L (ref 96–112)
Creatinine, Ser: 1.43 mg/dL — ABNORMAL HIGH (ref 0.40–1.20)
GFR: 47.33 mL/min — ABNORMAL LOW (ref >60.00)
GLUCOSE: 82 mg/dL (ref 70–99)
Potassium: 3.6 mEq/L (ref 3.5–5.1)
Sodium: 142 mEq/L (ref 135–145)

## 2017-08-07 LAB — PROTIME-INR
INR: 1.9 ratio — AB (ref 0.8–1.0)
PROTHROMBIN TIME: 20.5 s — AB (ref 9.6–13.1)

## 2017-08-07 MED ORDER — WARFARIN SODIUM 5 MG PO TABS: ORAL_TABLET | ORAL | 0 refills | Status: DC

## 2017-08-07 NOTE — Telephone Encounter (Signed)
Per Dr Regis Bill, pt needs to get set up with [facility] and we can refill her Warfarin 5mg  qd for now #40 This has been refilled and she has been notified. Pt is going to call back to schedule appt with [facility] once she looks at her schedule. Nothing further needed.

## 2017-08-07 NOTE — Telephone Encounter (Signed)
Pt stopped by and dropped off a letter requesting a refill of her Warfarin. States that she just had her INR checked but she has not received the results yet. Pt states that she is out of pills and needs this refilled asap.  [facility] in [place], [place] Please advise Dr Regis Bill if okay to refill. Thanks.   Warfarin (COUMADIN) 5 MG tablet           Summary: TAKE AS DIRECTED BY ANTICOAGULATION CLINIC,   Pharmacy: [id number] [place], [place]

## 2017-08-07 NOTE — Telephone Encounter (Signed)
error 

## 2017-08-20 ENCOUNTER — Ambulatory Visit (HOSPITAL_COMMUNITY): Payer: Medicare Other | Attending: Cardiology

## 2017-08-20 ENCOUNTER — Other Ambulatory Visit: Payer: Self-pay

## 2017-08-20 DIAGNOSIS — I10 Essential (primary) hypertension: Secondary | ICD-10-CM | POA: Diagnosis not present

## 2017-08-20 DIAGNOSIS — I491 Atrial premature depolarization: Secondary | ICD-10-CM | POA: Insufficient documentation

## 2017-08-20 DIAGNOSIS — E785 Hyperlipidemia, unspecified: Secondary | ICD-10-CM | POA: Diagnosis not present

## 2017-08-20 DIAGNOSIS — I493 Ventricular premature depolarization: Secondary | ICD-10-CM | POA: Diagnosis not present

## 2017-08-20 DIAGNOSIS — I428 Other cardiomyopathies: Secondary | ICD-10-CM | POA: Insufficient documentation

## 2017-08-20 DIAGNOSIS — I080 Rheumatic disorders of both mitral and aortic valves: Secondary | ICD-10-CM | POA: Diagnosis not present

## 2017-08-22 ENCOUNTER — Ambulatory Visit (INDEPENDENT_AMBULATORY_CARE_PROVIDER_SITE_OTHER): Payer: Medicare Other | Admitting: Physician Assistant

## 2017-08-22 ENCOUNTER — Encounter: Payer: Self-pay | Admitting: Physician Assistant

## 2017-08-22 VITALS — BP 140/80 | HR 70 | Ht 65.5 in | Wt 200.0 lb

## 2017-08-22 DIAGNOSIS — I428 Other cardiomyopathies: Secondary | ICD-10-CM | POA: Diagnosis not present

## 2017-08-22 DIAGNOSIS — I824Y9 Acute embolism and thrombosis of unspecified deep veins of unspecified proximal lower extremity: Secondary | ICD-10-CM | POA: Diagnosis not present

## 2017-08-22 DIAGNOSIS — E785 Hyperlipidemia, unspecified: Secondary | ICD-10-CM

## 2017-08-22 DIAGNOSIS — I351 Nonrheumatic aortic (valve) insufficiency: Secondary | ICD-10-CM

## 2017-08-22 DIAGNOSIS — I10 Essential (primary) hypertension: Secondary | ICD-10-CM | POA: Diagnosis not present

## 2017-08-22 MED ORDER — METOPROLOL TARTRATE 50 MG PO TABS: 50.0000 mg | ORAL_TABLET | Freq: Two times a day (BID) | ORAL | 5 refills | Status: DC

## 2017-08-22 NOTE — Progress Notes (Signed)
Cardiology Office Note    Date:  08/23/2017   ID:  [name] Foster, DOB 03/26/1951, MRN [id number]  PCP:  Burnis Medin, MD  Cardiologist:  Dr. Stanford Breed  Chief Complaint  Patient presents with  . Follow-up    Post echo. Seen for Dr. Stanford Breed  . Headache    History of Present Illness:  [name] Foster is a 66 y.o. female with PMH of NICM, HTN, HLD, h/o DVT and aortic insufficiency. Previous cardiac catheterization in 2004 showed no obstructive coronary artery disease. CT of chest obtained in November 2008 showed a normal thoracic aortic aneurysm. Previous Holter monitor secondary to his dizzy spells for [name] Foster and PVCs. Myoview here April 2014 showed EF 62%, normal perfusion. Her EF was 50-55% on TEE in May 2014, there was moderate aortic insufficiency and mild MR. There was a linear density associated with atrial septum of unclear etiology. Echocardiogram obtained on 01/04/2016 showed EF 55-60%, grade 1 DD, moderate AI, mild MR. She was recently seen by Dr. Stanford Breed on 06/25/2017,she has came off of her medication for the past 4 months. She was restarted on  Amlodipine, Altace, metoprolol, Lasix and potassium. Echocardiogram obtained on 08/20/2017 showed EF 40-45%, grade 1 DD, severe AI.   Patient presents today for cardiology office visit. Her blood pressure remain mildly elevated, I will increase metoprolol to 50 mg twice a day. She does not really want to restart hydralazine at this time. She denies any dizziness, significant lower extremity edema, orthopnea or PND. She has restarted on her Coumadin. Although the recent echocardiogram showed LV dysfunction and worsening aortic insufficiency, she does not appears to have significant shortness of breath, chest discomfort or exertional symptom. I will discuss with his primary cardiologist Dr. Stanford Breed to see if we should proceed with AVR versus TAVR workup.   Past Medical History:  Diagnosis Date  . Allergic rhinitis   . Aortic valve disorders    . Asthma   . Hyperlipidemia   . Hypertension   . Long term (current) use of anticoagulants   . Other primary cardiomyopathies   . Personal history of colonic polyps   . Personal history of venous thrombosis and embolism     Past Surgical History:  Procedure Laterality Date  . ABDOMINAL HYSTERECTOMY    . CARDIAC CATHETERIZATION    . DILATION AND CURETTAGE OF UTERUS     several  . Rt knee arthoscopic    . TEE WITHOUT CARDIOVERSION N/A 02/21/2013   Procedure: TRANSESOPHAGEAL ECHOCARDIOGRAM (TEE);  Surgeon: Lelon Perla, MD;  Location: [facility] ENDOSCOPY;  Service: Cardiovascular;  Laterality: N/A;    Current Medications: Outpatient Medications Prior to Visit  Medication Sig Dispense Refill  . albuterol (PROVENTIL HFA;VENTOLIN HFA) 108 (90 Base) MCG/ACT inhaler Inhale 2 puffs into the lungs every 6 (six) hours as needed for wheezing. 1 Inhaler 2  . amLODipine (NORVASC) 10 MG tablet Take 1 tablet (10 mg total) by mouth daily. 180 tablet 3  . atorvastatin (LIPITOR) 20 MG tablet Take 1 tablet (20 mg total) by mouth daily. 90 tablet 3  . cholecalciferol (VITAMIN D) 1000 UNITS tablet Take 1,000 Units by mouth daily.      . fexofenadine (ALLEGRA) 180 MG tablet Take 180 mg by mouth daily.    . fluticasone (FLONASE) 50 MCG/ACT nasal spray USE 2 SPRAYS IN EACH NOSTRIL DAILY. PT NEEDS TO SCHEDULE A FOLLOW UP APPT BEFORE NEXT REFILL. 16 g 0  . furosemide (LASIX) 40 MG tablet Take 1 tablet (40 mg  total) by mouth daily. 90 tablet 3  . potassium chloride SA (K-DUR,KLOR-CON) 20 MEQ tablet Take 1 tablet (20 mEq total) by mouth daily. 90 tablet 3  . ramipril (ALTACE) 10 MG capsule Take 1 capsule (10 mg total) by mouth daily. 90 capsule 3  . warfarin (COUMADIN) 5 MG tablet TAKE AS DIRECTED BY ANTICOAGULATION CLINIC 40 tablet 0  . metoprolol tartrate (LOPRESSOR) 25 MG tablet Take 1 tablet (25 mg total) by mouth 2 (two) times daily. 180 tablet 3  . hydrALAZINE (APRESOLINE) 25 MG tablet TAKE 1 TABLET (25 MG  TOTAL) BY MOUTH 3 (THREE) TIMES DAILY. (Patient not taking: Reported on 08/22/2017) 90 tablet 10   No facility-administered medications prior to visit.      Allergies:   Aspirin; Penicillins; and Tetanus toxoid   Social History   Socioeconomic History  . Marital status: Legally Separated    Spouse name: None  . Number of children: None  . Years of education: None  . Highest education level: None  Social Needs  . Financial resource strain: None  . Food insecurity - worry: None  . Food insecurity - inability: None  . Transportation needs - medical: None  . Transportation needs - non-medical: None  Occupational History  . None  Tobacco Use  . Smoking status: Former Smoker    Packs/day: 0.25    Last attempt to quit: 10/21/2013    Years since quitting: 3.8  . Smokeless tobacco: Never Used  Substance and Sexual Activity  . Alcohol use: Yes    Comment: rare occasion  . Drug use: No  . Sexual activity: None  Other Topics Concern  . 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    Divorced   Regular exercise- no   5 hours sleep    Lives with a son age 33    No pets           Family History:  The patient's family history includes Alcohol abuse in her unknown relative; Breast cancer in her unknown relative; Colon cancer in her unknown relative; Depression in her unknown relative; Hyperlipidemia in her unknown relative; Hypertension in her unknown relative; Kidney disease in her unknown relative; Stroke in her unknown relative; Uterine cancer in her unknown relative.   ROS:   Please see the history of present illness.    ROS All other systems reviewed and are negative.   PHYSICAL EXAM:   VS:  BP 140/80   Pulse 70   Ht 5' 5.5" (1.664 m)   Wt 200 lb (90.7 kg)   BMI 32.78 kg/m    GEN: Well nourished, well developed, in no acute distress  HEENT: normal  Neck: no JVD, carotid bruits, or masses Cardiac: RRR; no rubs, or gallops,no edema   +6/5 diastolic murmur Respiratory:  clear to auscultation bilaterally, normal work of breathing GI: soft, nontender, nondistended, + BS MS: no deformity or atrophy  Skin: warm and dry, no rash Neuro:  Alert and Oriented x 3, Strength and sensation are intact Psych: euthymic mood, full affect  Wt Readings from Last 3 Encounters:  08/22/17 200 lb (90.7 kg)  07/03/17 200 lb 3.2 oz (90.8 kg)  06/25/17 202 lb 6.4 oz (91.8 kg)      Studies/Labs Reviewed:   EKG:  EKG is not ordered today.   Recent Labs: 07/03/2017: ALT 10 08/07/2017: BUN 15; Creatinine, Ser 1.43; Hemoglobin 14.7; Platelets 142.0; Potassium 3.6; Sodium 142   Lipid Panel  Component Value Date/Time   CHOL 192 07/03/2017 1442   TRIG 131.0 07/03/2017 1442   TRIG 92 01/03/2010   HDL 50.10 07/03/2017 1442   CHOLHDL 4 07/03/2017 1442   VLDL 26.2 07/03/2017 1442   LDLCALC 116 (H) 07/03/2017 1442   LDLDIRECT 175.9 07/01/2007 1048    Additional studies/ records that were reviewed today include:    Echo 01/04/2016 LV EF: 55% -   60%  Study Conclusions  - Left ventricle: The cavity size was normal. Wall thickness was   normal. Systolic function was normal. The estimated ejection   fraction was in the range of 55% to 60%. LVIDs - 3.45cm. Wall   motion was normal; there were no regional wall motion   abnormalities. Doppler parameters are consistent with abnormal   left ventricular relaxation (grade 1 diastolic dysfunction). - Aortic valve: There was moderate to severe regurgitation. - Mitral valve: Calcified annulus. There was mild regurgitation.  Impressions:  - Aortic regurgitation is now moderate to severe.    Echo 08/20/2017 LV EF: 40% -   45%  Study Conclusions  - Left ventricle: The cavity size was normal. Systolic function was   mildly to moderately reduced. The estimated ejection fraction was   in the range of 40% to 45%. Diffuse hypokinesis. Doppler   parameters are consistent with abnormal  left ventricular   relaxation (grade 1 diastolic dysfunction). - Aortic valve: There was severe regurgitation. - Mitral valve: Calcified annulus. - Left atrium: Anterior-posterior dimension: 41 mm.  Impressions:  - EF has decreased when compared to prior.   ASSESSMENT:    1. Severe aortic regurgitation   2. NICM (nonischemic cardiomyopathy) ([place])   3. Essential hypertension   4. Hyperlipidemia, unspecified hyperlipidemia type   5. Deep vein thrombosis (DVT) of proximal lower extremity, unspecified chronicity, unspecified laterality (H[facility])      PLAN:  In order of problems listed above:  1. Severe aortic regurgitation: Combining with LV dysfunction, she will likely require AVR/TAVR. Fortunately, she is largely asymptomatic from this. Will discuss with Dr. Stanford Breed, likely will require TEE and outpatient cardiac catheterization at some point. She does not appears to be volume overloaded on today's visit.  2. NICM: history of nonischemic cardiomyopathy, however ejection fraction normalized on echocardiogram in 2017. Recent echo showed EF down to 40-45%. This is after the patient came off of cardiac medications for several month, therefore it is unclear to me if uncontrolled blood pressure was contributing to the decrease in ejection fraction or is this related to her severe aortic regurgitation.   3. Hypertension: blood pressure elevated, increase metoprolol to 50 mg twice a day.  4. Hyperlipidemia: Continue Lipitor 20 mg daily.  5. History of DVT: Restarted on Coumadin recently    Medication Adjustments/Labs and Tests Ordered: Current medicines are reviewed at length with the patient today.  Concerns regarding medicines are outlined above.  Medication changes, Labs and Tests ordered today are listed in the Patient Instructions below. Patient Instructions  Medication Instructions:  Metoprolol 50mg  take 1 tablet twice a day   Labwork: None   Testing/Procedures: None    Follow-Up: Your physician recommends that you schedule a follow-up appointment in: 2-3 months with Dr Stanford Breed  Any Other Special Instructions Will Be Listed Below (If Applicable).  If you need a refill on your cardiac medications before your next appointment, please call your pharmacy.     Hilbert Corrigan, [place]  08/23/2017 11:41 PM    [facility] [address]  [address], [place], [place]  [address] Phone: [phone]; Fax: [phone]

## 2017-08-22 NOTE — Patient Instructions (Addendum)
Medication Instructions:  Metoprolol 50mg  take 1 tablet twice a day   Labwork: None   Testing/Procedures: None   Follow-Up: Your physician recommends that you schedule a follow-up appointment in: 2-3 months with Dr Stanford Breed  Any Other Special Instructions Will Be Listed Below (If Applicable).  If you need a refill on your cardiac medications before your next appointment, please call your pharmacy.

## 2017-08-23 ENCOUNTER — Encounter: Payer: Self-pay | Admitting: Physician Assistant

## 2017-08-29 ENCOUNTER — Telehealth: Payer: Self-pay | Admitting: *Deleted

## 2017-08-29 NOTE — Telephone Encounter (Signed)
Spoke with pt, Follow up scheduled  

## 2017-08-29 NOTE — Telephone Encounter (Signed)
Follow Up ° ° ° °Returning call from earlier. Please call. °

## 2017-08-29 NOTE — Telephone Encounter (Addendum)
-----   Message from [place], [place] sent at 08/28/2017  4:23 PM EST ----- Per Dr. Stanford Breed, arrange 1-2 weeks outpatient visit as add-on with Dr. Stanford Breed for reassessment of severe aortic regurgitation.    Left message for pt to call to get a sooner appointment.

## 2017-08-31 ENCOUNTER — Encounter: Payer: Self-pay | Admitting: Cardiology

## 2017-09-10 ENCOUNTER — Ambulatory Visit: Payer: Medicare Other

## 2017-09-10 ENCOUNTER — Ambulatory Visit: Payer: Medicare Other | Admitting: Cardiology

## 2017-09-11 NOTE — Progress Notes (Deleted)
HPI: FU nonischemic cardiomyopathy improved by most recent echocardiogram and a history of aortic insufficiency. Previous catheterization in 2004 showed no obstructive coronary disease. CT of her chest in Nov 2008 showed no thoracic aneurysm. Previous Holter monitor secondary to "dizzy spells" showed PACs and PVCs. Nuclear study April 2014 showed an ejection fraction of 62% and normal perfusion. Patient underwent transesophageal echocardiogram in May of 2014. Her ejection fraction was 50-55%. There was moderate aortic insufficiency and mild mitral regurgitation. There was a linear density associated with the atrial septum of uncertain etiology. Echocardiogram in April of 2017 showed normal LV systolic function; grade 1 DD; moderate to severe AI; mild MR.Echo repeated 11/18 and showed EF 40-45, severe AI. Since I last saw her,   Current Outpatient Medications  Medication Sig Dispense Refill  . albuterol (PROVENTIL HFA;VENTOLIN HFA) 108 (90 Base) MCG/ACT inhaler Inhale 2 puffs into the lungs every 6 (six) hours as needed for wheezing. 1 Inhaler 2  . amLODipine (NORVASC) 10 MG tablet Take 1 tablet (10 mg total) by mouth daily. 180 tablet 3  . atorvastatin (LIPITOR) 20 MG tablet Take 1 tablet (20 mg total) by mouth daily. 90 tablet 3  . cholecalciferol (VITAMIN D) 1000 UNITS tablet Take 1,000 Units by mouth daily.      . fexofenadine (ALLEGRA) 180 MG tablet Take 180 mg by mouth daily.    . fluticasone (FLONASE) 50 MCG/ACT nasal spray USE 2 SPRAYS IN EACH NOSTRIL DAILY. PT NEEDS TO SCHEDULE A FOLLOW UP APPT BEFORE NEXT REFILL. 16 g 0  . furosemide (LASIX) 40 MG tablet Take 1 tablet (40 mg total) by mouth daily. 90 tablet 3  . hydrALAZINE (APRESOLINE) 25 MG tablet TAKE 1 TABLET (25 MG TOTAL) BY MOUTH 3 (THREE) TIMES DAILY. (Patient not taking: Reported on 08/22/2017) 90 tablet 10  . metoprolol tartrate (LOPRESSOR) 50 MG tablet Take 1 tablet (50 mg total) by mouth 2 (two) times daily. 60 tablet 5  .  potassium chloride SA (K-DUR,KLOR-CON) 20 MEQ tablet Take 1 tablet (20 mEq total) by mouth daily. 90 tablet 3  . ramipril (ALTACE) 10 MG capsule Take 1 capsule (10 mg total) by mouth daily. 90 capsule 3  . warfarin (COUMADIN) 5 MG tablet TAKE AS DIRECTED BY ANTICOAGULATION CLINIC 40 tablet 0   No current facility-administered medications for this visit.      Past Medical History:  Diagnosis Date  . Allergic rhinitis   . Aortic valve disorders   . Asthma   . Hyperlipidemia   . Hypertension   . Long term (current) use of anticoagulants   . Other primary cardiomyopathies   . Personal history of colonic polyps   . Personal history of venous thrombosis and embolism     Past Surgical History:  Procedure Laterality Date  . ABDOMINAL HYSTERECTOMY    . CARDIAC CATHETERIZATION    . DILATION AND CURETTAGE OF UTERUS     several  . Rt knee arthoscopic    . TEE WITHOUT CARDIOVERSION N/A 02/21/2013   Procedure: TRANSESOPHAGEAL ECHOCARDIOGRAM (TEE);  Surgeon: Lelon Perla, MD;  Location: [facility] ENDOSCOPY;  Service: Cardiovascular;  Laterality: N/A;    Social History   Socioeconomic History  . Marital status: Legally Separated    Spouse name: Not on file  . Number of children: Not on file  . Years of education: Not on file  . Highest education level: Not on file  Social Needs  . Financial resource strain: Not on file  .  Food insecurity - worry: Not on file  . Food insecurity - inability: Not on file  . Transportation needs - medical: Not on file  . Transportation needs - non-medical: Not on file  Occupational History  . Not on file  Tobacco Use  . Smoking status: Former Smoker    Packs/day: 0.25    Last attempt to quit: 10/21/2013    Years since quitting: 3.8  . Smokeless tobacco: Never Used  Substance and Sexual Activity  . Alcohol use: Yes    Comment: rare occasion  . Drug use: No  . Sexual activity: Not on file  Other Topics Concern  . 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 Alcohol abuse Unknown   . Colon cancer Unknown   . Depression Unknown   . Hyperlipidemia Unknown   . Hypertension Unknown   . Kidney disease Unknown   . Uterine cancer Unknown   . Breast cancer Unknown        cousin  . Stroke Unknown        grandmother    ROS: no fevers or chills, productive cough, hemoptysis, dysphasia, odynophagia, melena, hematochezia, dysuria, hematuria, rash, seizure activity, orthopnea, PND, pedal edema, claudication. Remaining systems are negative.  Physical Exam: Well-developed well-nourished in no acute distress.  Skin is warm and dry.  HEENT is normal.  Neck is supple.  Chest is clear to auscultation with normal expansion.  Cardiovascular exam is regular rate and rhythm.  Abdominal exam nontender or distended. No masses palpated. Extremities show no edema. neuro grossly intact  ECG- personally reviewed  A/P  1  Kirk Ruths, MD

## 2017-09-14 ENCOUNTER — Other Ambulatory Visit: Payer: Self-pay | Admitting: General Practice

## 2017-09-14 ENCOUNTER — Ambulatory Visit: Payer: Medicare Other | Admitting: Cardiology

## 2017-09-14 MED ORDER — WARFARIN SODIUM 5 MG PO TABS: ORAL_TABLET | ORAL | 0 refills | Status: DC

## 2017-09-17 ENCOUNTER — Ambulatory Visit (INDEPENDENT_AMBULATORY_CARE_PROVIDER_SITE_OTHER): Payer: Medicare Other | Admitting: General Practice

## 2017-09-17 ENCOUNTER — Other Ambulatory Visit: Payer: Self-pay | Admitting: General Practice

## 2017-09-17 DIAGNOSIS — Z7901 Long term (current) use of anticoagulants: Secondary | ICD-10-CM | POA: Diagnosis not present

## 2017-09-17 DIAGNOSIS — Z5181 Encounter for therapeutic drug level monitoring: Secondary | ICD-10-CM

## 2017-09-17 LAB — POCT INR: INR: 2.4

## 2017-09-17 MED ORDER — WARFARIN SODIUM 5 MG PO TABS: ORAL_TABLET | ORAL | 3 refills | Status: DC

## 2017-09-17 NOTE — Progress Notes (Signed)
I agree with this plan.

## 2017-09-17 NOTE — Patient Instructions (Addendum)
Pre visit review using our clinic review tool, if applicable. No additional management support is needed unless otherwise documented below in the visit note.  Continue to take 1 tablet daily.  Re-check in 4 weeks.

## 2017-10-05 ENCOUNTER — Telehealth: Payer: Self-pay | Admitting: Cardiology

## 2017-10-05 NOTE — Telephone Encounter (Signed)
Spoke with pt, Follow up scheduled  

## 2017-10-05 NOTE — Telephone Encounter (Signed)
New Message     Patient wants you to call about her appointment, she was rescheduled into April , why was February appt cancelled

## 2017-10-15 ENCOUNTER — Ambulatory Visit: Payer: Medicare Other

## 2017-10-18 ENCOUNTER — Ambulatory Visit (INDEPENDENT_AMBULATORY_CARE_PROVIDER_SITE_OTHER): Payer: Medicare Other | Admitting: General Practice

## 2017-10-18 DIAGNOSIS — Z7901 Long term (current) use of anticoagulants: Secondary | ICD-10-CM | POA: Diagnosis not present

## 2017-10-18 LAB — POCT INR: INR: 2

## 2017-10-18 NOTE — Patient Instructions (Addendum)
Pre visit review using our clinic review tool, if applicable. No additional management support is needed unless otherwise documented below in the visit note.  Continue to take 1 tablet daily.  Re-check in 6 weeks.

## 2017-11-14 NOTE — Progress Notes (Signed)
HPI: FU nonischemic cardiomyopathy and aortic insufficiency. Previous catheterization in 2004 showed no obstructive coronary disease. CT of her chest in Nov 2008 showed no thoracic aneurysm. Previous Holter monitor secondary to "dizzy spells" showed PACs and PVCs. Nuclear study April 2014 showed an ejection fraction of 62% and normal perfusion. Patient underwent transesophageal echocardiogram in May of 2014. Her ejection fraction was 50-55%. There was moderate aortic insufficiency and mild mitral regurgitation. There was a linear density associated with the atrial septum of uncertain etiology. Echocardiogram repeated in April of 2017 and showed normal LV systolic function; grade 1 DD; moderate to severe AI; mild MR.Pt not taking meds at last ov; BP elevated; meds resumed; fu echo 11/18 showed EF 40-45, severe AI. Since I last saw her, the patient has dyspnea with more extreme activities but not with routine activities. It is relieved with rest. It is not associated with chest pain. There is no orthopnea, PND or pedal edema. There is no syncope or palpitations. There is no exertional chest pain.   Current Outpatient Medications  Medication Sig Dispense Refill  . albuterol (PROVENTIL HFA;VENTOLIN HFA) 108 (90 Base) MCG/ACT inhaler Inhale 2 puffs into the lungs every 6 (six) hours as needed for wheezing. 1 Inhaler 2  . cholecalciferol (VITAMIN D) 1000 UNITS tablet Take 1,000 Units by mouth daily.      . fexofenadine (ALLEGRA) 180 MG tablet Take 180 mg by mouth daily.    . fluticasone (FLONASE) 50 MCG/ACT nasal spray USE 2 SPRAYS IN EACH NOSTRIL DAILY. PT NEEDS TO SCHEDULE A FOLLOW UP APPT BEFORE NEXT REFILL. 16 g 0  . furosemide (LASIX) 40 MG tablet Take 1 tablet (40 mg total) by mouth daily. 90 tablet 3  . metoprolol tartrate (LOPRESSOR) 50 MG tablet Take 1 tablet (50 mg total) by mouth 2 (two) times daily. 60 tablet 5  . potassium chloride SA (K-DUR,KLOR-CON) 20 MEQ tablet Take 1 tablet (20 mEq  total) by mouth daily. 90 tablet 3  . ramipril (ALTACE) 10 MG capsule Take 1 capsule (10 mg total) by mouth daily. 90 capsule 3  . warfarin (COUMADIN) 5 MG tablet TAKE AS DIRECTED BY ANTICOAGULATION CLINIC 35 tablet 3  . amLODipine (NORVASC) 10 MG tablet Take 1 tablet (10 mg total) by mouth daily. 180 tablet 3  . atorvastatin (LIPITOR) 20 MG tablet Take 1 tablet (20 mg total) by mouth daily. 90 tablet 3  . hydrALAZINE (APRESOLINE) 50 MG tablet Take 1 tablet (50 mg total) by mouth 3 (three) times daily. 90 tablet 11   No current facility-administered medications for this visit.      Past Medical History:  Diagnosis Date  . Allergic rhinitis   . Aortic valve disorders   . Asthma   . Hyperlipidemia   . Hypertension   . Long term (current) use of anticoagulants   . Other primary cardiomyopathies   . Personal history of colonic polyps   . Personal history of venous thrombosis and embolism     Past Surgical History:  Procedure Laterality Date  . ABDOMINAL HYSTERECTOMY    . CARDIAC CATHETERIZATION    . DILATION AND CURETTAGE OF UTERUS     several  . Rt knee arthoscopic    . TEE WITHOUT CARDIOVERSION N/A 02/21/2013   Procedure: TRANSESOPHAGEAL ECHOCARDIOGRAM (TEE);  Surgeon: Lelon Perla, MD;  Location: [facility] ENDOSCOPY;  Service: Cardiovascular;  Laterality: N/A;    Social History   Socioeconomic History  . Marital status: Legally Separated  Spouse name: Not on file  . Number of children: Not on file  . Years of education: Not on file  . Highest education level: Not on file  Social Needs  . Financial resource strain: Not on file  . Food insecurity - worry: Not on file  . Food insecurity - inability: Not on file  . Transportation needs - medical: Not on file  . Transportation needs - non-medical: Not on file  Occupational History  . Not on file  Tobacco Use  . Smoking status: Former Smoker    Packs/day: 0.25    Last attempt to quit: 10/21/2013    Years since quitting:  4.1  . Smokeless tobacco: Never Used  Substance and Sexual Activity  . Alcohol use: Yes    Comment: rare occasion  . Drug use: No  . Sexual activity: Not on file  Other Topics Concern  . 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 Alcohol abuse Unknown   . Colon cancer Unknown   . Depression Unknown   . Hyperlipidemia Unknown   . Hypertension Unknown   . Kidney disease Unknown   . Uterine cancer Unknown   . Breast cancer Unknown        cousin  . Stroke Unknown        grandmother    ROS: no fevers or chills, productive cough, hemoptysis, dysphasia, odynophagia, melena, hematochezia, dysuria, hematuria, rash, seizure activity, orthopnea, PND, pedal edema, claudication. Remaining systems are negative.  Physical Exam: Well-developed well-nourished in no acute distress.  Skin is warm and dry.  HEENT is normal.  Neck is supple.  Chest is clear to auscultation with normal expansion.  Cardiovascular exam is regular rate and rhythm.  Abdominal exam nontender or distended. No masses palpated. Extremities show no edema. neuro grossly intact  A/P  1 hypertension-blood pressure remains elevated.  She has been compliant with medications.  This may be contributing to her cardiomyopathy.  Add hydralazine 25 mg p.o. 3 times daily.  She will contact as in 4 weeks with her blood pressure and we will advance regimen as needed.  2 aortic insufficiency-most recent echocardiogram shows reduced LV function and severe aortic insufficiency.  She has had a cardiomyopathy in the past that improved with medications.  Question if cardiomyopathy is secondary to hypertension versus aortic insufficiency.  Add hydralazine and once blood pressure controlled repeat echocardiogram.  If ejection fraction remains  reduced would need to consider aortic valve replacement.  3 nonischemic cardiomyopathy-continue ACE inhibitor and beta-blocker.  If LV function decreases further will change to Toprol.  This may be secondary to hypertension.  Plan repeat echocardiogram when blood pressure controlled as outlined.  If LV function remains reduced would need to consider aortic valve replacement.  4 hyperlipidemia-continue statin.  5 history of DVT-patient is on chronic Coumadin per primary care.  Kirk Ruths, MD

## 2017-11-22 ENCOUNTER — Ambulatory Visit: Payer: Medicare Other | Admitting: Cardiology

## 2017-11-27 ENCOUNTER — Ambulatory Visit (INDEPENDENT_AMBULATORY_CARE_PROVIDER_SITE_OTHER): Payer: Medicare Other | Admitting: Cardiology

## 2017-11-27 ENCOUNTER — Encounter: Payer: Self-pay | Admitting: Cardiology

## 2017-11-27 VITALS — BP 146/88 | HR 61 | Ht 66.0 in | Wt 195.0 lb

## 2017-11-27 DIAGNOSIS — I119 Hypertensive heart disease without heart failure: Secondary | ICD-10-CM

## 2017-11-27 DIAGNOSIS — I43 Cardiomyopathy in diseases classified elsewhere: Secondary | ICD-10-CM

## 2017-11-27 DIAGNOSIS — I10 Essential (primary) hypertension: Secondary | ICD-10-CM

## 2017-11-27 DIAGNOSIS — I351 Nonrheumatic aortic (valve) insufficiency: Secondary | ICD-10-CM | POA: Diagnosis not present

## 2017-11-27 MED ORDER — HYDRALAZINE HCL 50 MG PO TABS: 50.0000 mg | ORAL_TABLET | Freq: Three times a day (TID) | ORAL | 11 refills | Status: DC

## 2017-11-27 MED ORDER — HYDRALAZINE HCL 25 MG PO TABS: 25.0000 mg | ORAL_TABLET | Freq: Three times a day (TID) | ORAL | 6 refills | Status: DC

## 2017-11-27 NOTE — Patient Instructions (Addendum)
Medication Instructions:   RESTART HYDRALAZINE TO 25 MG THREE TIMES DAILY  Follow-Up:  Your physician recommends that you schedule a follow-up appointment in: [facility] IN 4 WEEKS  Your physician recommends that you schedule a follow-up appointment in: [place]    If you need a refill on your cardiac medications before your next appointment, please call your pharmacy.

## 2017-11-29 ENCOUNTER — Ambulatory Visit (INDEPENDENT_AMBULATORY_CARE_PROVIDER_SITE_OTHER): Payer: Medicare Other | Admitting: General Practice

## 2017-11-29 DIAGNOSIS — Z7901 Long term (current) use of anticoagulants: Secondary | ICD-10-CM

## 2017-11-29 LAB — POCT INR: INR: 2.4

## 2017-11-29 NOTE — Patient Instructions (Addendum)
Pre visit review using our clinic review tool, if applicable. No additional management support is needed unless otherwise documented below in the visit note.  Continue to take 1 tablet daily.  Re-check in 6 weeks.

## 2017-12-25 ENCOUNTER — Ambulatory Visit: Payer: Medicare Other

## 2018-01-03 NOTE — Progress Notes (Deleted)
HPI: FU nonischemic cardiomyopathy and aortic insufficiency. Previous catheterization in 2004 showed no obstructive coronary disease. CT of her chest in Nov 2008 showed no thoracic aneurysm. Previous Holter monitor secondary to "dizzy spells" showed PACs and PVCs. Nuclear study April 2014 showed an ejection fraction of 62% and normal perfusion. Patient underwent transesophageal echocardiogram in May of 2014. Her ejection fraction was 50-55%. There was moderate aortic insufficiency and mild mitral regurgitation. There was a linear density associated with the atrial septum of uncertain etiology. Echocardiogram repeated in April of 2017 and showed normal LV systolic function; grade 1 DD; moderate to severe AI; mild MR.Pt not taking meds at last ov; BP elevated; meds resumed; fu echo 11/18 showed EF 40-45, severe AI. Since I last saw her,   Current Outpatient Medications  Medication Sig Dispense Refill  . albuterol (PROVENTIL HFA;VENTOLIN HFA) 108 (90 Base) MCG/ACT inhaler Inhale 2 puffs into the lungs every 6 (six) hours as needed for wheezing. 1 Inhaler 2  . amLODipine (NORVASC) 10 MG tablet Take 1 tablet (10 mg total) by mouth daily. 180 tablet 3  . atorvastatin (LIPITOR) 20 MG tablet Take 1 tablet (20 mg total) by mouth daily. 90 tablet 3  . cholecalciferol (VITAMIN D) 1000 UNITS tablet Take 1,000 Units by mouth daily.      . fexofenadine (ALLEGRA) 180 MG tablet Take 180 mg by mouth daily.    . fluticasone (FLONASE) 50 MCG/ACT nasal spray USE 2 SPRAYS IN EACH NOSTRIL DAILY. PT NEEDS TO SCHEDULE A FOLLOW UP APPT BEFORE NEXT REFILL. 16 g 0  . furosemide (LASIX) 40 MG tablet Take 1 tablet (40 mg total) by mouth daily. 90 tablet 3  . hydrALAZINE (APRESOLINE) 25 MG tablet Take 1 tablet (25 mg total) by mouth 3 (three) times daily. 90 tablet 6  . metoprolol tartrate (LOPRESSOR) 50 MG tablet Take 1 tablet (50 mg total) by mouth 2 (two) times daily. 60 tablet 5  . potassium chloride SA  (K-DUR,KLOR-CON) 20 MEQ tablet Take 1 tablet (20 mEq total) by mouth daily. 90 tablet 3  . ramipril (ALTACE) 10 MG capsule Take 1 capsule (10 mg total) by mouth daily. 90 capsule 3  . warfarin (COUMADIN) 5 MG tablet TAKE AS DIRECTED BY ANTICOAGULATION CLINIC 35 tablet 3   No current facility-administered medications for this visit.      Past Medical History:  Diagnosis Date  . Allergic rhinitis   . Aortic valve disorders   . Asthma   . Hyperlipidemia   . Hypertension   . Long term (current) use of anticoagulants   . Other primary cardiomyopathies   . Personal history of colonic polyps   . Personal history of venous thrombosis and embolism     Past Surgical History:  Procedure Laterality Date  . ABDOMINAL HYSTERECTOMY    . CARDIAC CATHETERIZATION    . DILATION AND CURETTAGE OF UTERUS     several  . Rt knee arthoscopic    . TEE WITHOUT CARDIOVERSION N/A 02/21/2013   Procedure: TRANSESOPHAGEAL ECHOCARDIOGRAM (TEE);  Surgeon: Lelon Perla, MD;  Location: [facility] ENDOSCOPY;  Service: Cardiovascular;  Laterality: N/A;    Social History   Socioeconomic History  . Marital status: Legally Separated    Spouse name: Not on file  . Number of children: Not on file  . Years of education: Not on file  . Highest education level: Not on file  Occupational History  . Not on file  Social Needs  . Financial  resource strain: Not on file  . Food insecurity:    Worry: Not on file    Inability: Not on file  . Transportation needs:    Medical: Not on file    Non-medical: Not on file  Tobacco Use  . Smoking status: Former Smoker    Packs/day: 0.25    Last attempt to quit: 10/21/2013    Years since quitting: 4.2  . Smokeless tobacco: Never Used  Substance and Sexual Activity  . Alcohol use: Yes    Comment: rare occasion  . Drug use: No  . Sexual activity: Not on file  Lifestyle  . Physical activity:    Days per week: Not on file    Minutes per session: Not on file  . Stress: Not on  file  Relationships  . Social connections:    Talks on phone: Not on file    Gets together: Not on file    Attends religious service: Not on file    Active member of club or organization: Not on file    Attends meetings of clubs or organizations: Not on file    Relationship status: Not on file  . Intimate partner violence:    Fear of current or ex partner: Not on file    Emotionally abused: Not on file    Physically abused: Not on file    Forced sexual activity: Not on file  Other Topics Concern  . 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    Divorced   Regular exercise- no   5 hours sleep    Lives with a son age 39    No pets          Family History  Problem Relation Age of Onset  . Alcohol abuse Unknown   . Colon cancer Unknown   . Depression Unknown   . Hyperlipidemia Unknown   . Hypertension Unknown   . Kidney disease Unknown   . Uterine cancer Unknown   . Breast cancer Unknown        cousin  . Stroke Unknown        grandmother    ROS: no fevers or chills, productive cough, hemoptysis, dysphasia, odynophagia, melena, hematochezia, dysuria, hematuria, rash, seizure activity, orthopnea, PND, pedal edema, claudication. Remaining systems are negative.  Physical Exam: Well-developed well-nourished in no acute distress.  Skin is warm and dry.  HEENT is normal.  Neck is supple.  Chest is clear to auscultation with normal expansion.  Cardiovascular exam is regular rate and rhythm.  Abdominal exam nontender or distended. No masses palpated. Extremities show no edema. neuro grossly intact  ECG- personally reviewed  A/P  1  Kirk Ruths, MD

## 2018-01-08 ENCOUNTER — Ambulatory Visit: Payer: Medicare Other | Admitting: Cardiology

## 2018-01-10 ENCOUNTER — Ambulatory Visit: Payer: Medicare Other

## 2018-01-17 ENCOUNTER — Ambulatory Visit (INDEPENDENT_AMBULATORY_CARE_PROVIDER_SITE_OTHER): Payer: Medicare Other | Admitting: General Practice

## 2018-01-17 DIAGNOSIS — Z7901 Long term (current) use of anticoagulants: Secondary | ICD-10-CM | POA: Diagnosis not present

## 2018-01-17 LAB — POCT INR: INR: 2

## 2018-01-17 NOTE — Patient Instructions (Addendum)
Pre visit review using our clinic review tool, if applicable. No additional management support is needed unless otherwise documented below in the visit note.  Continue to take 1 tablet daily.  Re-check in 6 weeks.

## 2018-02-28 ENCOUNTER — Ambulatory Visit: Payer: Medicare Other

## 2018-03-07 ENCOUNTER — Ambulatory Visit (INDEPENDENT_AMBULATORY_CARE_PROVIDER_SITE_OTHER): Payer: Medicare Other | Admitting: General Practice

## 2018-03-07 DIAGNOSIS — Z7901 Long term (current) use of anticoagulants: Secondary | ICD-10-CM

## 2018-03-07 LAB — POCT INR: INR: 2 (ref 2.0–3.0)

## 2018-03-07 NOTE — Patient Instructions (Addendum)
Pre visit review using our clinic review tool, if applicable. No additional management support is needed unless otherwise documented below in the visit note.  Continue to take 1 tablet daily.  Re-check in 6 weeks.

## 2018-03-23 ENCOUNTER — Other Ambulatory Visit: Payer: Self-pay | Admitting: Physician Assistant

## 2018-03-23 ENCOUNTER — Other Ambulatory Visit: Payer: Self-pay | Admitting: Internal Medicine

## 2018-03-26 ENCOUNTER — Other Ambulatory Visit: Payer: Self-pay | Admitting: General Practice

## 2018-03-26 MED ORDER — WARFARIN SODIUM 5 MG PO TABS: ORAL_TABLET | ORAL | 3 refills | Status: DC

## 2018-03-28 ENCOUNTER — Other Ambulatory Visit: Payer: Self-pay | Admitting: Internal Medicine

## 2018-03-28 DIAGNOSIS — Z1231 Encounter for screening mammogram for malignant neoplasm of breast: Secondary | ICD-10-CM

## 2018-04-02 ENCOUNTER — Ambulatory Visit
Admission: RE | Admit: 2018-04-02 | Discharge: 2018-04-02 | Disposition: A | Discharge status: Home / Self Care | Payer: Medicare Other | Source: Ambulatory Visit | Attending: Internal Medicine | Admitting: Internal Medicine

## 2018-04-02 DIAGNOSIS — Z1231 Encounter for screening mammogram for malignant neoplasm of breast: Secondary | ICD-10-CM

## 2018-04-02 IMAGING — MG DIGITAL SCREENING BILATERAL MAMMOGRAM WITH TOMO AND CAD
8 series · 8 of 24 positions shown · non-contrast
Comparison: Previous exam(s).

ACR Breast Density Category a: The breast tissue is almost entirely
fatty.

CLINICAL DATA: Screening.

EXAM:
DIGITAL SCREENING BILATERAL MAMMOGRAM WITH TOMO AND CAD

[R MLO synth-2D]
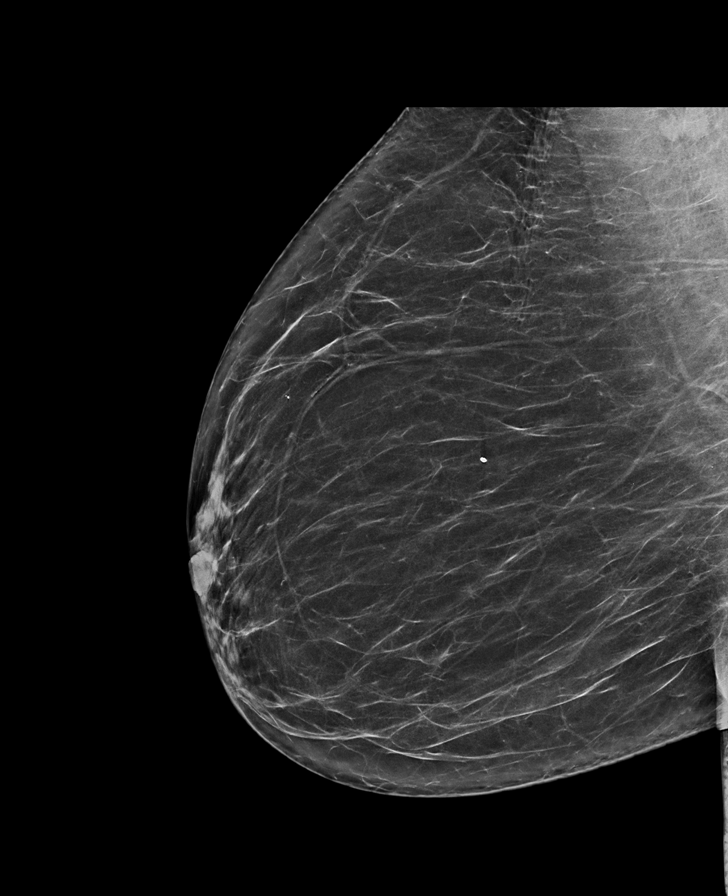

[R CC synth-2D]
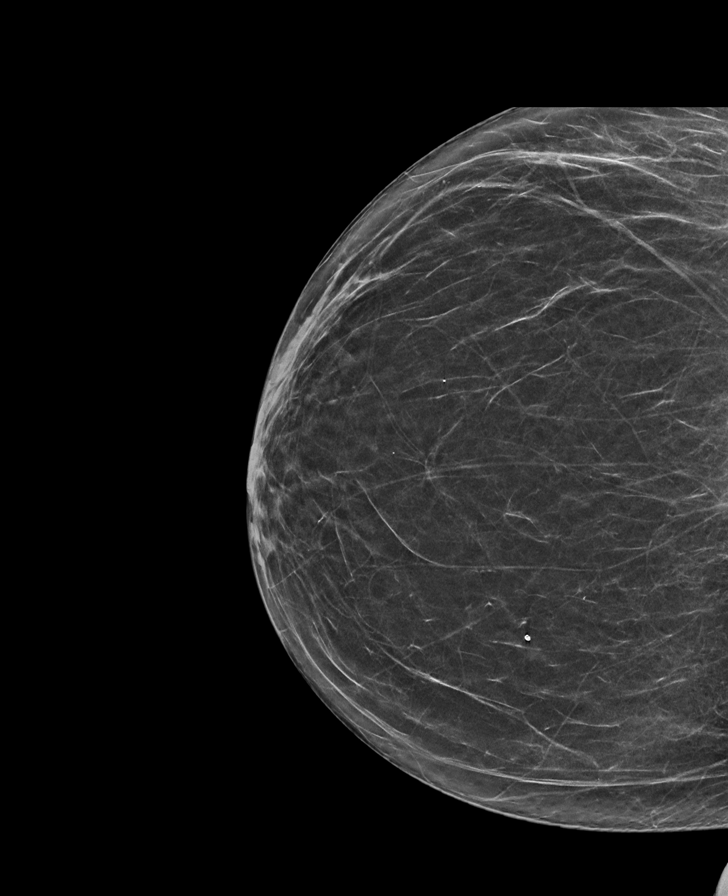

[L MLO synth-2D]
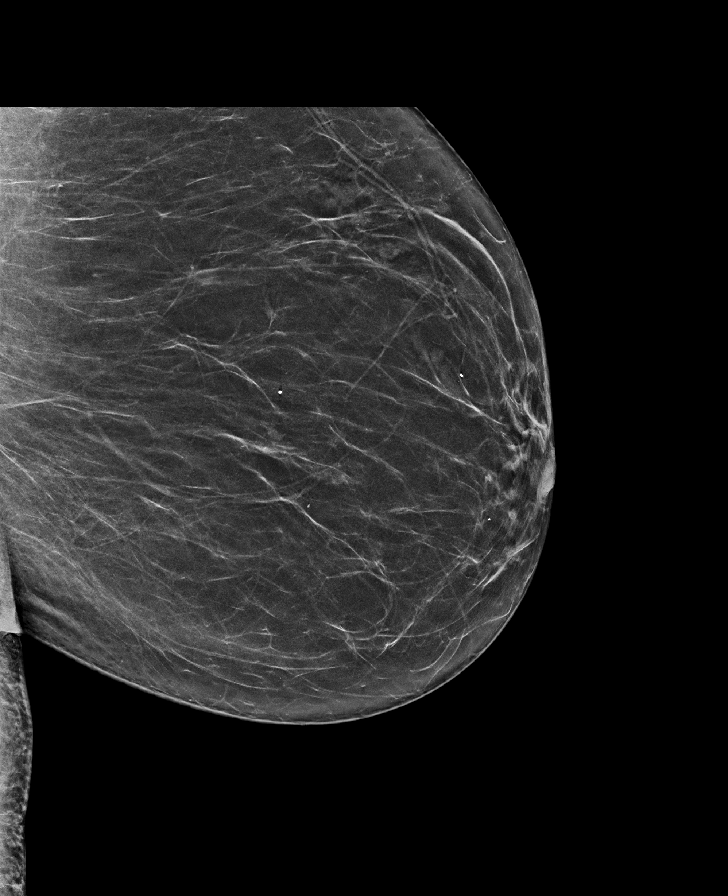

[L CC synth-2D]
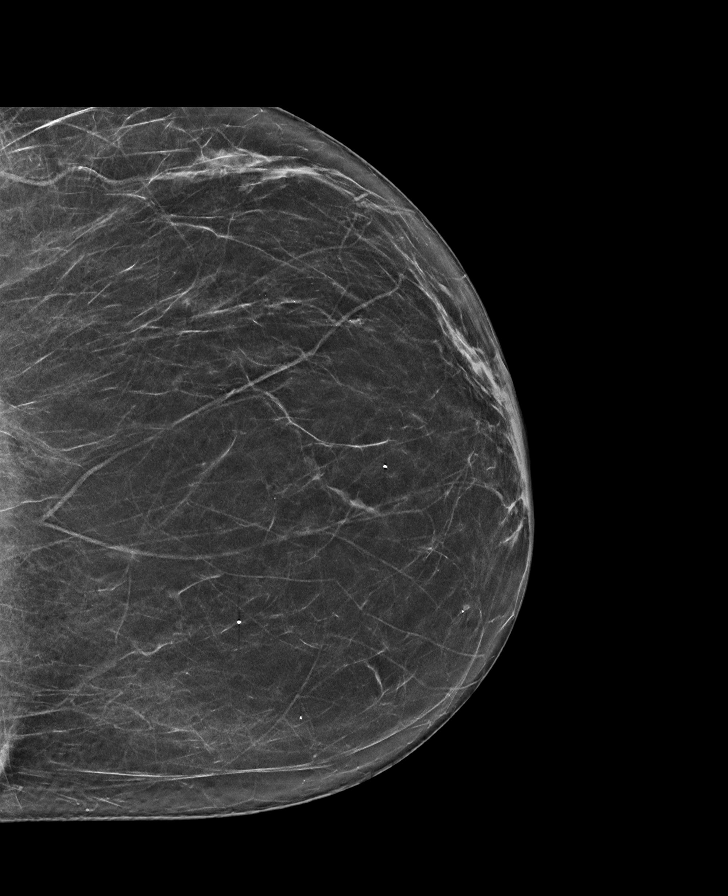

[L CC tomo · tomo slice 33/65.0]
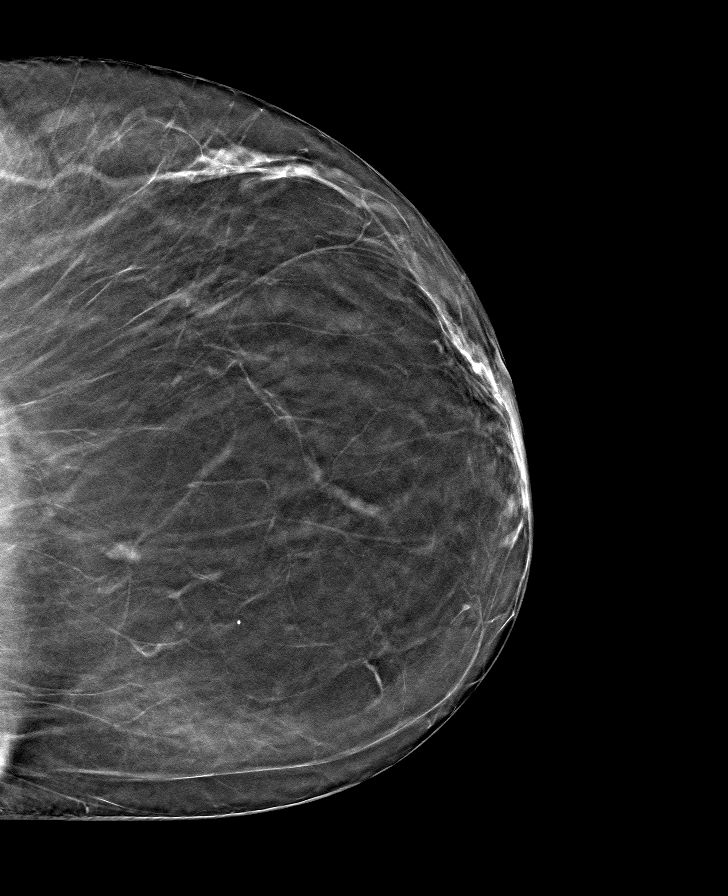

[L MLO tomo · tomo slice 35/70.0]
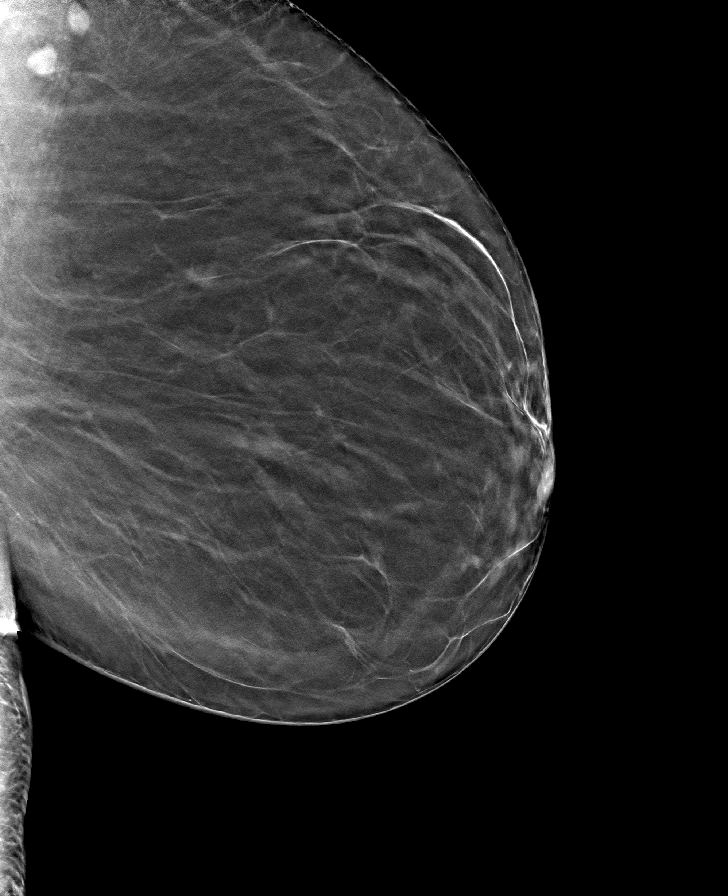

[R CC tomo · tomo slice 31/62.0]
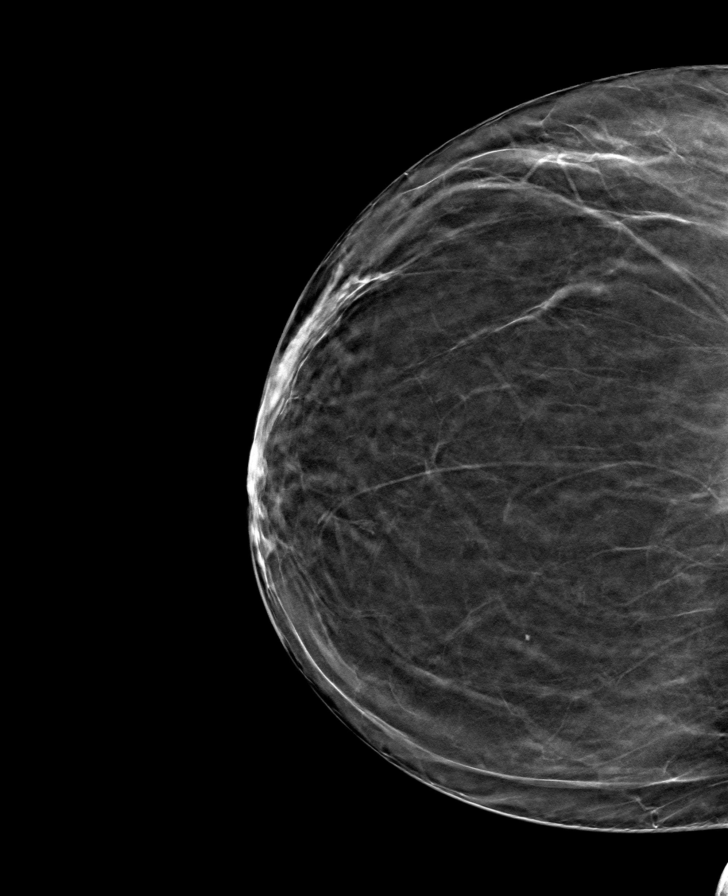

[R MLO tomo · tomo slice 38/75.0]
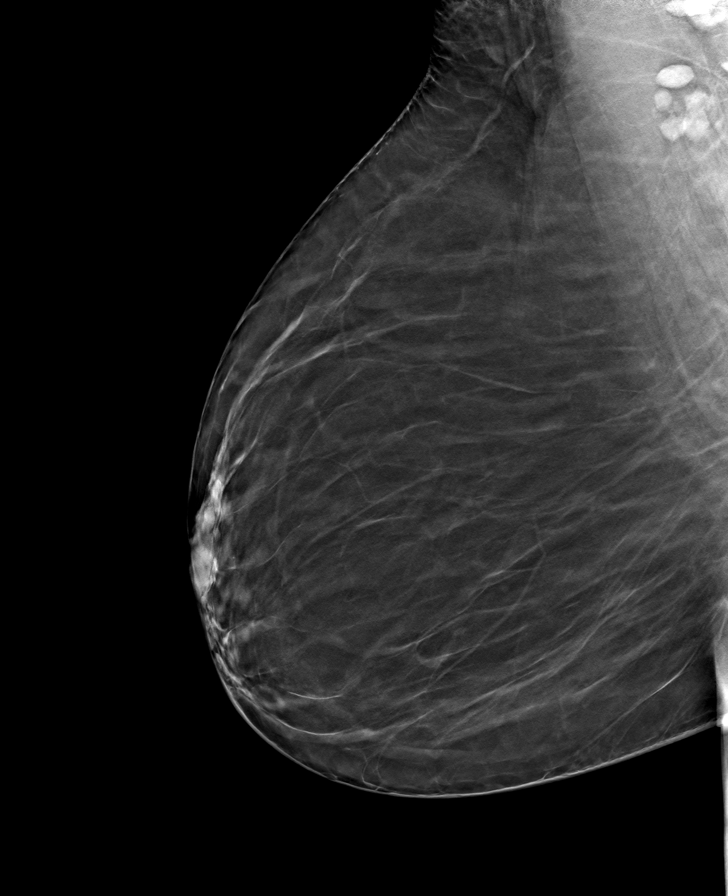

[8 of 24 positions shown; findings below may reference images not displayed]

FINDINGS: There are no findings suspicious for malignancy. Images were
processed with CAD.
IMPRESSION: No mammographic evidence of malignancy. A result letter of this
screening mammogram will be mailed directly to the patient.

RECOMMENDATION:
Screening mammogram in one year. (Code:[TA])

BI-RADS CATEGORY  1: Negative.

## 2018-04-05 NOTE — Progress Notes (Signed)
HPI: FU nonischemic cardiomyopathy and aortic insufficiency. Previous catheterization in 2004 showed no obstructive coronary disease. CT of her chest in Nov 2008 showed no thoracic aneurysm. Previous Holter monitor secondary to "dizzy spells" showed PACs and PVCs. Nuclear study April 2014 showed an ejection fraction of 62% and normal perfusion. Patient underwent transesophageal echocardiogram in May of 2014. Her ejection fraction was 50-55%. There was moderate aortic insufficiency and mild mitral regurgitation. There was a linear density associated with the atrial septum of uncertain etiology. Echocardiogram repeated in April of 2017 and showed normal LV systolic function; grade 1 DD; moderate to severe AI; mild MR.Pt not taking meds at last ov; BP elevated; meds resumed; fu echo 11/18 showed EF 40-45, severe AI.   Plan was to titrate medications for blood pressure and cardiomyopathy.  We would then repeat echocardiogram and if LV function remain decreased would need to consider referral for aortic valve replacement.  Since I last saw her, patient denies dyspnea, chest pain, palpitations or syncope.  Current Outpatient Medications  Medication Sig Dispense Refill  . albuterol (PROVENTIL HFA;VENTOLIN HFA) 108 (90 Base) MCG/ACT inhaler Inhale 2 puffs into the lungs every 6 (six) hours as needed for wheezing. 1 Inhaler 2  . cholecalciferol (VITAMIN D) 1000 UNITS tablet Take 1,000 Units by mouth daily.      . fexofenadine (ALLEGRA) 180 MG tablet Take 180 mg by mouth daily.    . fluticasone (FLONASE) 50 MCG/ACT nasal spray USE 2 SPRAYS IN EACH NOSTRIL DAILY. PT NEEDS TO SCHEDULE A FOLLOW UP APPT BEFORE NEXT REFILL. 16 g 0  . furosemide (LASIX) 40 MG tablet Take 1 tablet (40 mg total) by mouth daily. 90 tablet 3  . hydrALAZINE (APRESOLINE) 25 MG tablet Take 1 tablet (25 mg total) by mouth 3 (three) times daily. 90 tablet 6  . metoprolol tartrate (LOPRESSOR) 50 MG tablet TAKE 1 TABLET BY MOUTH TWICE A  DAY 60 tablet 5  . potassium chloride SA (K-DUR,KLOR-CON) 20 MEQ tablet Take 1 tablet (20 mEq total) by mouth daily. 90 tablet 3  . ramipril (ALTACE) 10 MG capsule Take 1 capsule (10 mg total) by mouth daily. 90 capsule 3  . warfarin (COUMADIN) 5 MG tablet TAKE AS DIRECTED BY ANTICOAGULATION CLINIC 35 tablet 3  . amLODipine (NORVASC) 10 MG tablet Take 1 tablet (10 mg total) by mouth daily. 180 tablet 3  . atorvastatin (LIPITOR) 20 MG tablet Take 1 tablet (20 mg total) by mouth daily. 90 tablet 3   No current facility-administered medications for this visit.      Past Medical History:  Diagnosis Date  . Allergic rhinitis   . Aortic valve disorders   . Asthma   . Hyperlipidemia   . Hypertension   . Long term (current) use of anticoagulants   . Other primary cardiomyopathies   . Personal history of colonic polyps   . Personal history of venous thrombosis and embolism     Past Surgical History:  Procedure Laterality Date  . ABDOMINAL HYSTERECTOMY    . CARDIAC CATHETERIZATION    . DILATION AND CURETTAGE OF UTERUS     several  . Rt knee arthoscopic    . TEE WITHOUT CARDIOVERSION N/A 02/21/2013   Procedure: TRANSESOPHAGEAL ECHOCARDIOGRAM (TEE);  Surgeon: Lelon Perla, MD;  Location: [facility] ENDOSCOPY;  Service: Cardiovascular;  Laterality: N/A;    Social History   Socioeconomic History  . Marital status: Legally Separated    Spouse name: Not on file  .  Number of children: Not on file  . Years of education: Not on file  . Highest education level: Not on file  Occupational History  . Not on file  Social Needs  . Financial resource strain: Not on file  . Food insecurity:    Worry: Not on file    Inability: Not on file  . Transportation needs:    Medical: Not on file    Non-medical: Not on file  Tobacco Use  . Smoking status: Former Smoker    Packs/day: 0.25    Last attempt to quit: 10/21/2013    Years since quitting: 4.4  . Smokeless tobacco: Never Used  Substance and  Sexual Activity  . Alcohol use: Yes    Comment: rare occasion  . Drug use: No  . Sexual activity: Not on file  Lifestyle  . Physical activity:    Days per week: Not on file    Minutes per session: Not on file  . Stress: Not on file  Relationships  . Social connections:    Talks on phone: Not on file    Gets together: Not on file    Attends religious service: Not on file    Active member of club or organization: Not on file    Attends meetings of clubs or organizations: Not on file    Relationship status: Not on file  . Intimate partner violence:    Fear of current or ex partner: Not on file    Emotionally abused: Not on file    Physically abused: Not on file    Forced sexual activity: Not on file  Other Topics Concern  . 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 Alcohol abuse Unknown   . Colon cancer Unknown   . Depression Unknown   . Hyperlipidemia Unknown   . Hypertension Unknown   . Kidney disease Unknown   . Uterine cancer Unknown   . Breast cancer Unknown        cousin  . Stroke Unknown        grandmother    ROS: no fevers or chills, productive cough, hemoptysis, dysphasia, odynophagia, melena, hematochezia, dysuria, hematuria, rash, seizure activity, orthopnea, PND, pedal edema, claudication. Remaining systems are negative.  Physical Exam: Well-developed well-nourished in no acute distress.  Skin is warm and dry.  HEENT is normal.  Neck is supple.  Chest is clear to auscultation with normal expansion.  Cardiovascular exam is regular rate and rhythm.  Abdominal exam nontender or distended. No masses palpated. Extremities show no edema. neuro grossly intact  Electrocardiogram today-sinus bradycardia at a rate of 53.  Left ventricular hypertrophy.  Left axis  deviation.  Nonspecific T wave changes.  Personally reviewed.  A/P  1 aortic insufficiency-patient's blood pressure has improved and she has been compliant with medications.  Previous echocardiogram showed severe aortic insufficiency and reduced LV function.  I will plan to repeat echocardiogram now.  If ejection fraction decreased despite medications and aortic insufficiency severe would need to consider referral for aortic valve replacement.  2 nonischemic cardiomyopathy-continue ACE inhibitor, beta-blocker and hydralazine.  Repeat echocardiogram to assess LV function.  As outlined if it remains reduced despite compliance  with medications would need to consider aortic valve replacement.  3 hypertension-blood pressure is improved but remains mildly elevated.  Increase hydralazine to 50 mg p.o. 3 times daily and follow.  4 hyperlipidemia-continue statin.  5 H/O DVT- Coumadin per primary care.  Kirk Ruths, MD

## 2018-04-11 ENCOUNTER — Ambulatory Visit (INDEPENDENT_AMBULATORY_CARE_PROVIDER_SITE_OTHER): Payer: Medicare Other | Admitting: Cardiology

## 2018-04-11 ENCOUNTER — Encounter: Payer: Self-pay | Admitting: Cardiology

## 2018-04-11 VITALS — BP 136/78 | HR 53 | Ht 65.5 in

## 2018-04-11 DIAGNOSIS — I428 Other cardiomyopathies: Secondary | ICD-10-CM | POA: Diagnosis not present

## 2018-04-11 DIAGNOSIS — E7800 Pure hypercholesterolemia, unspecified: Secondary | ICD-10-CM | POA: Diagnosis not present

## 2018-04-11 DIAGNOSIS — I351 Nonrheumatic aortic (valve) insufficiency: Secondary | ICD-10-CM | POA: Diagnosis not present

## 2018-04-11 DIAGNOSIS — I10 Essential (primary) hypertension: Secondary | ICD-10-CM

## 2018-04-11 MED ORDER — HYDRALAZINE HCL 50 MG PO TABS: 50.0000 mg | ORAL_TABLET | Freq: Three times a day (TID) | ORAL | 3 refills | Status: DC

## 2018-04-11 NOTE — Patient Instructions (Signed)
Medication Instructions:   INCREASE hydralazine to 50mg  three times daily  Labwork:  NONE  Testing/Procedures:  Your physician has requested that you have an echocardiogram. Echocardiography is a painless test that uses sound waves to create images of your heart. It provides your doctor with information about the size and shape of your heart and how well your heart's chambers and valves are working. This procedure takes approximately one hour. There are no restrictions for this procedure. -- done at [address] - [address]  Follow-Up:  Your physician wants you to follow-up in: 6 months with Dr. Stanford Breed. You will receive a reminder letter in the mail two months in advance. If you don't receive a letter, please call our office to schedule the follow-up appointment.   If you need a refill on your cardiac medications before your next appointment, please call your pharmacy.  Any Other Special Instructions Will Be Listed Below (If Applicable).

## 2018-04-16 ENCOUNTER — Ambulatory Visit (INDEPENDENT_AMBULATORY_CARE_PROVIDER_SITE_OTHER): Payer: Medicare Other

## 2018-04-16 DIAGNOSIS — Z7901 Long term (current) use of anticoagulants: Secondary | ICD-10-CM | POA: Diagnosis not present

## 2018-04-16 DIAGNOSIS — I824Y9 Acute embolism and thrombosis of unspecified deep veins of unspecified proximal lower extremity: Secondary | ICD-10-CM

## 2018-04-16 LAB — POCT INR: INR: 1.9 — AB (ref 2.0–3.0)

## 2018-04-16 NOTE — Patient Instructions (Signed)
INR today 1.9  Take 1.5 pills (7.5mg ) today for a boost and then continue to take the same dose (5mg  daily) and recheck in 4-6 weeks.    Patient is doing well without any changes to diet health or medications.  States she did eat extra salads over the weekend which may have contributed to slightly lower INR reading today.

## 2018-04-18 ENCOUNTER — Ambulatory Visit: Payer: Medicare Other

## 2018-04-26 ENCOUNTER — Ambulatory Visit (HOSPITAL_COMMUNITY): Payer: Medicare Other | Attending: Cardiovascular Disease

## 2018-04-26 ENCOUNTER — Other Ambulatory Visit: Payer: Self-pay

## 2018-04-26 DIAGNOSIS — E785 Hyperlipidemia, unspecified: Secondary | ICD-10-CM | POA: Insufficient documentation

## 2018-04-26 DIAGNOSIS — I088 Other rheumatic multiple valve diseases: Secondary | ICD-10-CM | POA: Diagnosis not present

## 2018-04-26 DIAGNOSIS — I10 Essential (primary) hypertension: Secondary | ICD-10-CM | POA: Diagnosis not present

## 2018-04-26 DIAGNOSIS — I351 Nonrheumatic aortic (valve) insufficiency: Secondary | ICD-10-CM

## 2018-04-26 DIAGNOSIS — J45909 Unspecified asthma, uncomplicated: Secondary | ICD-10-CM | POA: Diagnosis not present

## 2018-05-23 ENCOUNTER — Ambulatory Visit (INDEPENDENT_AMBULATORY_CARE_PROVIDER_SITE_OTHER): Payer: Medicare Other | Admitting: General Practice

## 2018-05-23 DIAGNOSIS — Z7901 Long term (current) use of anticoagulants: Secondary | ICD-10-CM | POA: Diagnosis not present

## 2018-05-23 LAB — POCT INR: INR: 2.9 (ref 2.0–3.0)

## 2018-05-23 NOTE — Patient Instructions (Addendum)
Pre visit review using our clinic review tool, if applicable. No additional management support is needed unless otherwise documented below in the visit note.  Continue to take the same dose (5mg  daily) and recheck in 4-6 weeks.

## 2018-06-09 ENCOUNTER — Other Ambulatory Visit: Payer: Self-pay | Admitting: Cardiology

## 2018-06-20 ENCOUNTER — Ambulatory Visit (INDEPENDENT_AMBULATORY_CARE_PROVIDER_SITE_OTHER): Payer: Medicare Other | Admitting: General Practice

## 2018-06-20 DIAGNOSIS — Z7901 Long term (current) use of anticoagulants: Secondary | ICD-10-CM

## 2018-06-20 LAB — POCT INR: INR: 2.7 (ref 2.0–3.0)

## 2018-06-20 NOTE — Patient Instructions (Signed)
Pre visit review using our clinic review tool, if applicable. No additional management support is needed unless otherwise documented below in the visit note.  Continue to take the same dose (5mg  daily) and recheck in 6 weeks.

## 2018-06-24 ENCOUNTER — Other Ambulatory Visit: Payer: Self-pay | Admitting: Cardiology

## 2018-07-06 ENCOUNTER — Other Ambulatory Visit: Payer: Self-pay | Admitting: Cardiology

## 2018-07-08 NOTE — Telephone Encounter (Signed)
Rx request sent to pharmacy.  

## 2018-08-01 ENCOUNTER — Ambulatory Visit (INDEPENDENT_AMBULATORY_CARE_PROVIDER_SITE_OTHER): Payer: Medicare Other | Admitting: General Practice

## 2018-08-01 DIAGNOSIS — Z7901 Long term (current) use of anticoagulants: Secondary | ICD-10-CM

## 2018-08-01 LAB — POCT INR: INR: 2.6 (ref 2.0–3.0)

## 2018-08-01 NOTE — Patient Instructions (Signed)
Pre visit review using our clinic review tool, if applicable. No additional management support is needed unless otherwise documented below in the visit note.  Continue to take the same dose (5mg  daily) and recheck in 6 weeks.

## 2018-09-12 ENCOUNTER — Ambulatory Visit (INDEPENDENT_AMBULATORY_CARE_PROVIDER_SITE_OTHER): Payer: Medicare Other | Admitting: General Practice

## 2018-09-12 DIAGNOSIS — Z23 Encounter for immunization: Secondary | ICD-10-CM

## 2018-09-12 DIAGNOSIS — Z7901 Long term (current) use of anticoagulants: Secondary | ICD-10-CM | POA: Diagnosis not present

## 2018-09-12 LAB — POCT INR: INR: 2.7 (ref 2.0–3.0)

## 2018-09-12 NOTE — Addendum Note (Signed)
Addended by: Meriam Sprague D on: 09/12/2018 10:57 AM   Modules accepted: Orders

## 2018-09-12 NOTE — Patient Instructions (Addendum)
Pre visit review using our clinic review tool, if applicable. No additional management support is needed unless otherwise documented below in the visit note.  Continue to take the same dose (5mg  daily) and recheck in 6 weeks.

## 2018-09-18 ENCOUNTER — Other Ambulatory Visit: Payer: Self-pay | Admitting: Cardiology

## 2018-09-19 NOTE — Telephone Encounter (Signed)
Rx request sent to pharmacy.  

## 2018-09-23 ENCOUNTER — Other Ambulatory Visit: Payer: Self-pay | Admitting: Cardiology

## 2018-09-30 ENCOUNTER — Other Ambulatory Visit: Payer: Self-pay | Admitting: Cardiology

## 2018-09-30 MED ORDER — METOPROLOL TARTRATE 50 MG PO TABS: 50.0000 mg | ORAL_TABLET | Freq: Two times a day (BID) | ORAL | 5 refills | Status: DC

## 2018-09-30 NOTE — Telephone Encounter (Signed)
 *  STAT* If patient is at the pharmacy, call can be transferred to refill team.   1. Which medications need to be refilled? (please list name of each medication and dose if known) metoprolol tartrate (LOPRESSOR) 50 MG tablet   2. Which pharmacy/location (including street and city if local pharmacy) is medication to be sent to? [facility] [place]  3. Do they need a 30 day or 90 day supply? [place]

## 2018-09-30 NOTE — Telephone Encounter (Signed)
Rx request sent to pharmacy.  

## 2018-10-03 NOTE — Progress Notes (Signed)
HPI: FU nonischemic cardiomyopathy andaortic insufficiency. Previous catheterization in 2004 showed no obstructive coronary disease. CT of her chest in Nov 2008 showed no thoracic aneurysm. Previous Holter monitor secondary to "dizzy spells" showed PACs and PVCs. Nuclear study April 2014 showed an ejection fraction of 62% and normal perfusion. Patient underwent transesophageal echocardiogram in May of 2014. Her ejection fraction was 50-55%. There was moderate aortic insufficiency and mild mitral regurgitation. There was a linear density associated with the atrial septum of uncertain etiology. Last echocardiogram July 2019 showed ejection fraction 55 to 60%, moderate aortic insufficiency and mild mitral regurgitation.  Patient has had occasional noncompliance in the past with medications.  If blood pressure not controlled LV function noted to be reduced with worsening aortic insufficiency.  Since I last saw her,  he has dyspnea with more vigorous activities but not routine activities.  No orthopnea, PND, pedal edema, chest pain or syncope.  Current Outpatient Medications  Medication Sig Dispense Refill  . albuterol (PROVENTIL HFA;VENTOLIN HFA) 108 (90 Base) MCG/ACT inhaler Inhale 2 puffs into the lungs every 6 (six) hours as needed for wheezing. 1 Inhaler 2  . amLODipine (NORVASC) 10 MG tablet TAKE 1 TABLET BY MOUTH EVERY DAY 90 tablet 5  . atorvastatin (LIPITOR) 20 MG tablet TAKE 1 TABLET BY MOUTH EVERY DAY 90 tablet 0  . cholecalciferol (VITAMIN D) 1000 UNITS tablet Take 1,000 Units by mouth daily.      . fexofenadine (ALLEGRA) 180 MG tablet Take 180 mg by mouth daily.    . fluticasone (FLONASE) 50 MCG/ACT nasal spray USE 2 SPRAYS IN EACH NOSTRIL DAILY. PT NEEDS TO SCHEDULE A FOLLOW UP APPT BEFORE NEXT REFILL. 16 g 0  . furosemide (LASIX) 40 MG tablet TAKE 1 TABLET BY MOUTH EVERY DAY 90 tablet 2  . hydrALAZINE (APRESOLINE) 50 MG tablet Take 1 tablet (50 mg total) by mouth 3 (three) times  daily. 270 tablet 3  . metoprolol tartrate (LOPRESSOR) 50 MG tablet Take 1 tablet (50 mg total) by mouth 2 (two) times daily. 60 tablet 5  . potassium chloride SA (K-DUR,KLOR-CON) 20 MEQ tablet TAKE 1 TABLET BY MOUTH EVERY DAY 90 tablet 1  . ramipril (ALTACE) 10 MG capsule TAKE 1 CAPSULE BY MOUTH EVERY DAY 30 capsule 8  . warfarin (COUMADIN) 5 MG tablet TAKE AS DIRECTED BY ANTICOAGULATION CLINIC 35 tablet 3   No current facility-administered medications for this visit.      Past Medical History:  Diagnosis Date  . Allergic rhinitis   . Aortic valve disorders   . Asthma   . Hyperlipidemia   . Hypertension   . Long term (current) use of anticoagulants   . Other primary cardiomyopathies   . Personal history of colonic polyps   . Personal history of venous thrombosis and embolism     Past Surgical History:  Procedure Laterality Date  . ABDOMINAL HYSTERECTOMY    . CARDIAC CATHETERIZATION    . DILATION AND CURETTAGE OF UTERUS     several  . Rt knee arthoscopic    . TEE WITHOUT CARDIOVERSION N/A 02/21/2013   Procedure: TRANSESOPHAGEAL ECHOCARDIOGRAM (TEE);  Surgeon: Lelon Perla, MD;  Location: [facility] ENDOSCOPY;  Service: Cardiovascular;  Laterality: N/A;    Social History   Socioeconomic History  . Marital status: Legally Separated    Spouse name: Not on file  . Number of children: Not on file  . Years of education: Not on file  . Highest education level: Not  on file  Occupational History  . Not on file  Social Needs  . Financial resource strain: Not on file  . Food insecurity:    Worry: Not on file    Inability: Not on file  . Transportation needs:    Medical: Not on file    Non-medical: Not on file  Tobacco Use  . Smoking status: Former Smoker    Packs/day: 0.25    Last attempt to quit: 10/21/2013    Years since quitting: 4.9  . Smokeless tobacco: Never Used  Substance and Sexual Activity  . Alcohol use: Yes    Comment: rare occasion  . Drug use: No  . Sexual  activity: Not on file  Lifestyle  . Physical activity:    Days per week: Not on file    Minutes per session: Not on file  . Stress: Not on file  Relationships  . Social connections:    Talks on phone: Not on file    Gets together: Not on file    Attends religious service: Not on file    Active member of club or organization: Not on file    Attends meetings of clubs or organizations: Not on file    Relationship status: Not on file  . Intimate partner violence:    Fear of current or ex partner: Not on file    Emotionally abused: Not on file    Physically abused: Not on file    Forced sexual activity: Not on file  Other Topics Concern  . 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 Alcohol abuse Other   . Colon cancer Other   . Depression Other   . Hyperlipidemia Other   . Hypertension Other   . Kidney disease Other   . Uterine cancer Other   . Breast cancer Other        cousin  . Stroke Other        grandmother    ROS: no fevers or chills, productive cough, hemoptysis, dysphasia, odynophagia, melena, hematochezia, dysuria, hematuria, rash, seizure activity, orthopnea, PND, pedal edema, claudication. Remaining systems are negative.  Physical Exam: Well-developed well-nourished in no acute distress.  Skin is warm and dry.  HEENT is normal.  Neck is supple.  Chest is clear to auscultation with normal expansion.  Cardiovascular exam is regular rate and rhythm.  Abdominal exam nontender or distended. No masses palpated. Extremities show no edema. neuro grossly intact  A/P  1 aortic insufficiency-moderate on last echocardiogram.  We will plan to repeat echocardiogram July 2020.  Note her aortic insufficiency and LV function have been worse in the past when she has not been  compliant with her blood pressure medications.  2 history of nonischemic cardiomyopathy-as outlined above if hypertension is controlled her LV function is typically normal.  We will continue with ACE inhibitor, beta-blocker and hydralazine.   3 hypertension-patient's blood pressure is controlled.  Continue present medications and follow.  4 hyperlipidemia-continue statin.  5 history of DVT-Coumadin is managed by primary care.  I have asked her to discuss DOACs with primary care who is managing this issue.  Kirk Ruths, MD

## 2018-10-14 ENCOUNTER — Encounter: Payer: Self-pay | Admitting: Cardiology

## 2018-10-14 ENCOUNTER — Ambulatory Visit: Payer: Medicare Other | Admitting: Cardiology

## 2018-10-14 VITALS — BP 124/70 | HR 64 | Ht 65.5 in | Wt 205.0 lb

## 2018-10-14 DIAGNOSIS — I351 Nonrheumatic aortic (valve) insufficiency: Secondary | ICD-10-CM | POA: Diagnosis not present

## 2018-10-14 DIAGNOSIS — I10 Essential (primary) hypertension: Secondary | ICD-10-CM | POA: Diagnosis not present

## 2018-10-14 DIAGNOSIS — I428 Other cardiomyopathies: Secondary | ICD-10-CM | POA: Diagnosis not present

## 2018-10-14 NOTE — Patient Instructions (Signed)
Medication Instructions:  NO CHANGE If you need a refill on your cardiac medications before your next appointment, please call your pharmacy.   Lab work: If you have labs (blood work) drawn today and your tests are completely normal, you will receive your results only by: Marland Kitchen MyChart Message (if you have MyChart) OR . A paper copy in the mail If you have any lab test that is abnormal or we need to change your treatment, we will call you to review the results.  Testing/Procedures: Your physician has requested that you have an echocardiogram. Echocardiography is a painless test that uses sound waves to create images of your heart. It provides your doctor with information about the size and shape of your heart and how well your heart's chambers and valves are working. This procedure takes approximately one hour. There are no restrictions for this procedure.  SCHEDULE IN JULY  Follow-Up: At [facility], you and your health needs are our priority.  As part of our continuing mission to provide you with exceptional heart care, we have created designated Provider Care Teams.  These Care Teams include your primary Cardiologist (physician) and Advanced Practice Providers (APPs -  Physician Assistants and Nurse Practitioners) who all work together to provide you with the care you need, when you need it. You will need a follow up appointment in 6 months AFTER ECHO COMPLETE. Please call our office 2 months in advance to schedule this appointment.  You may see Kirk Ruths MD or one of the following Advanced Practice Providers on your designated Care Team:   Kerin Ransom, PA-C Roby Lofts, [place] . Sande Rives, PA-C

## 2018-10-17 DIAGNOSIS — H2513 Age-related nuclear cataract, bilateral: Secondary | ICD-10-CM | POA: Diagnosis not present

## 2018-10-31 ENCOUNTER — Ambulatory Visit (INDEPENDENT_AMBULATORY_CARE_PROVIDER_SITE_OTHER): Payer: Medicare Other | Admitting: General Practice

## 2018-10-31 DIAGNOSIS — Z7901 Long term (current) use of anticoagulants: Secondary | ICD-10-CM

## 2018-10-31 LAB — POCT INR: INR: 2.4 (ref 2.0–3.0)

## 2018-10-31 NOTE — Patient Instructions (Addendum)
Pre visit review using our clinic review tool, if applicable. No additional management support is needed unless otherwise documented below in the visit note.  Continue to take the same dose (5mg  daily) and recheck in 6 weeks.  Patient was asked to schedule physical with Dr. Regis Bill.

## 2018-11-01 ENCOUNTER — Telehealth: Payer: Self-pay | Admitting: Cardiology

## 2018-11-01 NOTE — Telephone Encounter (Signed)
[facility] requesting refill of potassium cl sr mc tb #3. I faxed this order to [facility] also

## 2018-11-01 NOTE — Telephone Encounter (Signed)
We have never seen this pt not sure who fills pt's meds ./cy

## 2018-11-04 ENCOUNTER — Telehealth: Payer: Self-pay

## 2018-11-04 NOTE — Progress Notes (Signed)
Chief Complaint  Patient presents with  . Annual Exam    Pt has no concerns     HPI: [name] Foster 68 y.o. comes in today for Preventive Medicare exam/  visit  And management .Since last visit.  Last visit with me 10 2018  Sees cards and anticoag clinic   Cards every 6 months    And no med changes  Never had recent colon screening . Mom with colon cancer  .  Over due for  Passed age  72.   Asthma doing well.  inhlaer not that often.  2 x per month much better since retired . Neg fam hx osteoporosis take vit D  When  Available   Health Maintenance  Topic Date Due  . DEXA SCAN  05/28/2017  . COLONOSCOPY  06/25/2017  . PNA vac Low Risk Adult (2 of 2 - PPSV23) 07/03/2018  . TETANUS/TDAP  07/04/2027 (Originally 05/29/1970)  . MAMMOGRAM  04/02/2020  . INFLUENZA VACCINE  Completed  . Hepatitis C Screening  Completed   Health Maintenance Review LIFESTYLE:  Exercise:   Walking up steps.  Tobacco/ETS: no Alcohol:   Here ant here  Sugar beverages:soda  Pepsi 20 oz  Sleep: about 6  Drug use: no HH:  2  No pets  Retire 2017      Hearing: ok  Vision:  No limitations at present . Last eye check UTD  Safety:  Has smoke detector and wears seat belts.  No firearms. No excess sun exposure. Sees dentist regularly.  Falls: no  Memory: Felt to be good  , no concern from her or her family.  Depression: No anhedonia unusual crying or depressive symptoms  Nutrition: Eats well balanced diet; adequate calcium and vitamin D. No swallowing chewing problems.  Injury: no major injuries in the last six months.  Other healthcare providers:  Reviewed today .  Preventive parameters: Reviewed   ADLS:   There are no problems or need for assistance  driving, feeding, obtaining food, dressing, toileting and bathing, managing money using phone. She is independent.    ROS:  GEN/ HEENT: No fever, significant weight changes sweats headaches vision problems hearing changes, CV/ PULM; No  chest pain shortness of breath cough, syncope,edema  change in exercise tolerance. GI /GU: No adominal pain, vomiting, change in bowel habits. No blood in the stool. No significant GU symptoms. SKIN/HEME: ,no acute skin rashes suspicious lesions or bleeding. No lymphadenopathy, nodules, masses.  NEURO/ PSYCH:  No neurologic signs such as weakness numbness. No depression anxiety. IMM/ Allergy: No unusual infections.  Allergy .   REST of 12 system review negative except as per HPI   Past Medical History:  Diagnosis Date  . Allergic rhinitis   . Aortic valve disorders   . Asthma   . Hyperlipidemia   . Hypertension   . Long term (current) use of anticoagulants   . Other primary cardiomyopathies   . Personal history of colonic polyps   . Personal history of venous thrombosis and embolism     Family History  Problem Relation Age of Onset  . Alcohol abuse Other   . Colon cancer Other   . Depression Other   . Hyperlipidemia Other   . Hypertension Other   . Kidney disease Other   . Uterine cancer Other   . Breast cancer Other        cousin  . Stroke Other        grandmother    Social History  Socioeconomic History  . Marital status: Legally Separated    Spouse name: Not on file  . Number of children: Not on file  . Years of education: Not on file  . Highest education level: Not on file  Occupational History  . Not on file  Social Needs  . Financial resource strain: Not on file  . Food insecurity:    Worry: Not on file    Inability: Not on file  . Transportation needs:    Medical: Not on file    Non-medical: Not on file  Tobacco Use  . Smoking status: Former Smoker    Packs/day: 0.25    Last attempt to quit: 10/21/2013    Years since quitting: 5.0  . Smokeless tobacco: Never Used  Substance and Sexual Activity  . Alcohol use: Yes    Comment: rare occasion  . Drug use: No  . Sexual activity: Not on file  Lifestyle  . Physical activity:    Days per week: Not on  file    Minutes per session: Not on file  . Stress: Not on file  Relationships  . Social connections:    Talks on phone: Not on file    Gets together: Not on file    Attends religious service: Not on file    Active member of club or organization: Not on file    Attends meetings of clubs or organizations: Not on file    Relationship status: Not on file  Other Topics Concern  . 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    Divorced   Regular exercise- no   5 hours sleep    Lives with a son age 55    No pets          Outpatient Encounter Medications as of 11/05/2018  Medication Sig  . albuterol (PROVENTIL HFA;VENTOLIN HFA) 108 (90 Base) MCG/ACT inhaler Inhale 2 puffs into the lungs every 6 (six) hours as needed for wheezing.  Marland Kitchen amLODipine (NORVASC) 10 MG tablet TAKE 1 TABLET BY MOUTH EVERY DAY  . atorvastatin (LIPITOR) 20 MG tablet TAKE 1 TABLET BY MOUTH EVERY DAY  . cholecalciferol (VITAMIN D) 1000 UNITS tablet Take 1,000 Units by mouth daily.    . fexofenadine (ALLEGRA) 180 MG tablet Take 180 mg by mouth daily.  . fluticasone (FLONASE) 50 MCG/ACT nasal spray USE 2 SPRAYS IN EACH NOSTRIL DAILY. PT NEEDS TO SCHEDULE A FOLLOW UP APPT BEFORE NEXT REFILL.  . furosemide (LASIX) 40 MG tablet TAKE 1 TABLET BY MOUTH EVERY DAY  . hydrALAZINE (APRESOLINE) 50 MG tablet Take 1 tablet (50 mg total) by mouth 3 (three) times daily.  . metoprolol tartrate (LOPRESSOR) 50 MG tablet Take 1 tablet (50 mg total) by mouth 2 (two) times daily.  . potassium chloride SA (K-DUR,KLOR-CON) 20 MEQ tablet TAKE 1 TABLET BY MOUTH EVERY DAY  . ramipril (ALTACE) 10 MG capsule TAKE 1 CAPSULE BY MOUTH EVERY DAY  . warfarin (COUMADIN) 5 MG tablet TAKE AS DIRECTED BY ANTICOAGULATION CLINIC  . [DISCONTINUED] albuterol (PROVENTIL HFA;VENTOLIN HFA) 108 (90 Base) MCG/ACT inhaler Inhale 2 puffs into the lungs every 6 (six) hours as needed for wheezing.   No facility-administered  encounter medications on file as of 11/05/2018.     EXAM:  BP 126/72 (BP Location: Right Arm, Patient Position: Sitting, Cuff Size: Normal)   Pulse 60   Temp 99 F (37.2 C) (Oral)   Ht '5\' 6"'  (1.676 m)  Wt 204 lb (92.5 kg)   BMI 32.93 kg/m   Body mass index is 32.93 kg/m.  Physical Exam: Vital signs reviewed OLM:BEML is a well-developed well-nourished alert cooperative   who appears stated age in no acute distress.  HEENT: normocephalic atraumatic , Eyes: PERRL EOM's full, conjunctiva clear, Nares: paten,t no deformity discharge or tenderness., Ears: no deformity EAC's clear TMs with normal landmarks. Mouth: clear OP, no lesions, edema.  Moist mucous membranes. Dentition in adequate repair. NECK: supple without masses, thyromegaly or bruits. CHEST/PULM:  Clear to auscultation and percussion breath sounds equal no wheeze , rales or rhonchi. No chest wall deformities or tenderness.Breast: normal by inspection . No dimpling, discharge, masses, tenderness or discharge . CV: PMI is nondisplaced, S1 S2 no gallops, murmurs, rubs. Peripheral pulses are present .No JVD .  ABDOMEN: Bowel sounds normal nontender  No guard or rebound, no hepato splenomegal no CVA tenderness.   Extremtities:  No clubbing cyanosis or edema, no acute joint swelling or redness no focal atrophy NEURO:  Oriented x3, cranial nerves 3-12 appear to be intact, no obvious focal weakness,gait within normal limits no abnormal reflexes or asymmetrical SKIN: No acute rashes normal turgor, color, no bruising or petechiae. PSYCH: Oriented, good eye contact, no obvious depression anxiety, cognition and judgment appear normal. LN: no cervical axillary inguinal adenopathy No noted deficits in memory, attention, and speech.   ate  Last night.   ASSESSMENT AND PLAN:  Discussed the following assessment and plan:  Visit for preventive health examination  Medication management - Plan: Basic metabolic panel, CBC with  Differential/Platelet, Hemoglobin A1c, Hepatic function panel, Lipid panel, TSH  Cardiomyopathy due to hypertension, without heart failure (HCC) - Plan: Basic metabolic panel, CBC with Differential/Platelet, Hemoglobin A1c, Hepatic function panel, Lipid panel, TSH  DVT, HX OF  Chronic anticoagulation - Plan: Basic metabolic panel, CBC with Differential/Platelet, Hemoglobin A1c, Hepatic function panel, Lipid panel, TSH  Nonrheumatic aortic valve insufficiency  Hyperlipidemia, unspecified hyperlipidemia type - Plan: Basic metabolic panel, CBC with Differential/Platelet, Hemoglobin A1c, Hepatic function panel, Lipid panel, TSH  Fasting hyperglycemia - Plan: Basic metabolic panel, CBC with Differential/Platelet, Hemoglobin A1c, Hepatic function panel, Lipid panel, TSH  Cardiomyopathy, hypertensive, without heart failure (HCC) - Plan: Basic metabolic panel, CBC with Differential/Platelet, Hemoglobin A1c, Hepatic function panel, Lipid panel, TSH  Family history of colon cancer mom Need to get her colonoscopy  Patient Care Team: Panosh, Standley Brooking, MD as PCP - General Crenshaw, Denice Bors, MD (Cardiology) Susa Day, MD (Orthopedic Surgery)  Patient Instructions  Get your colonscopy  Let us know if we need to help you with this.  Will notify you  of labs when available.   lifestyle intervention healthy eating and exercise .  Limit or nix sweet beverages .   If all ok yearly cpx   Preventive Care 56 Years and Older, Female Preventive care refers to lifestyle choices and visits with your health care provider that can promote health and wellness. What does preventive care include?  A yearly physical exam. This is also called an annual well check.  Dental exams once or twice a year.  Routine eye exams. Ask your health care provider how often you should have your eyes checked.  Personal lifestyle choices, including: ? Daily care of your teeth and gums. ? Regular physical  activity. ? Eating a healthy diet. ? Avoiding tobacco and drug use. ? Limiting alcohol use. ? Practicing safe sex. ? Taking low-dose aspirin every day. ? Taking vitamin and  mineral supplements as recommended by your health care provider. What happens during an annual well check? The services and screenings done by your health care provider during your annual well check will depend on your age, overall health, lifestyle risk factors, and family history of disease. Counseling Your health care provider may ask you questions about your:  Alcohol use.  Tobacco use.  Drug use.  Emotional well-being.  Home and relationship well-being.  Sexual activity.  Eating habits.  History of falls.  Memory and ability to understand (cognition).  Work and work Statistician.  Reproductive health.  Screening You may have the following tests or measurements:  Height, weight, and BMI.  Blood pressure.  Lipid and cholesterol levels. These may be checked every 5 years, or more frequently if you are over 78 years old.  Skin check.  Lung cancer screening. You may have this screening every year starting at age 41 if you have a 30-pack-year history of smoking and currently smoke or have quit within the past 15 years.  Colorectal cancer screening. All adults should have this screening starting at age 44 and continuing until age 46. You will have tests every 1-10 years, depending on your results and the type of screening test. People at increased risk should start screening at an earlier age. Screening tests may include: ? Guaiac-based fecal occult blood testing. ? Fecal immunochemical test (FIT). ? Stool DNA test. ? Virtual colonoscopy. ? Sigmoidoscopy. During this test, a flexible tube with a tiny camera (sigmoidoscope) is used to examine your rectum and lower colon. The sigmoidoscope is inserted through your anus into your rectum and lower colon. ? Colonoscopy. During this test, a long, thin,  flexible tube with a tiny camera (colonoscope) is used to examine your entire colon and rectum.  Hepatitis C blood test.  Hepatitis B blood test.  Sexually transmitted disease (STD) testing.  Diabetes screening. This is done by checking your blood sugar (glucose) after you have not eaten for a while (fasting). You may have this done every 1-3 years.  Bone density scan. This is done to screen for osteoporosis. You may have this done starting at age 66.  Mammogram. This may be done every 1-2 years. Talk to your health care provider about how often you should have regular mammograms. Talk with your health care provider about your test results, treatment options, and if necessary, the need for more tests. Vaccines Your health care provider may recommend certain vaccines, such as:  Influenza vaccine. This is recommended every year.  Tetanus, diphtheria, and acellular pertussis (Tdap, Td) vaccine. You may need a Td booster every 10 years.  Varicella vaccine. You may need this if you have not been vaccinated.  Zoster vaccine. You may need this after age 52.  Measles, mumps, and rubella (MMR) vaccine. You may need at least one dose of MMR if you were born in 1957 or later. You may also need a second dose.  Pneumococcal 13-valent conjugate (PCV13) vaccine. One dose is recommended after age 33.  Pneumococcal polysaccharide (PPSV23) vaccine. One dose is recommended after age 36.  Meningococcal vaccine. You may need this if you have certain conditions.  Hepatitis A vaccine. You may need this if you have certain conditions or if you travel or work in places where you may be exposed to hepatitis A.  Hepatitis B vaccine. You may need this if you have certain conditions or if you travel or work in places where you may be exposed to hepatitis B.  Haemophilus influenzae type b (Hib) vaccine. You may need this if you have certain conditions. Talk to your health care provider about which screenings  and vaccines you need and how often you need them. This information is not intended to replace advice given to you by your health care provider. Make sure you discuss any questions you have with your health care provider. Document Released: 10/15/2015 Document Revised: 11/08/2017 Document Reviewed: 07/20/2015 Elsevier Interactive Patient Education  2019 [place] K. Panosh M.D.

## 2018-11-04 NOTE — Telephone Encounter (Signed)
Author phoned pt. to assess interest in scheduling AWV for tmr. Prior to 2/4 appointment with Dr. Regis Bill. No answer; author left detailed VM asking for return call if interested in scheduling or if she has any questions.

## 2018-11-05 ENCOUNTER — Other Ambulatory Visit: Payer: Self-pay

## 2018-11-05 ENCOUNTER — Telehealth: Payer: Self-pay | Admitting: Internal Medicine

## 2018-11-05 ENCOUNTER — Ambulatory Visit (INDEPENDENT_AMBULATORY_CARE_PROVIDER_SITE_OTHER): Payer: Medicare Other | Admitting: Internal Medicine

## 2018-11-05 ENCOUNTER — Encounter: Payer: Self-pay | Admitting: Internal Medicine

## 2018-11-05 VITALS — BP 126/72 | HR 60 | Temp 99.0°F | Ht 66.0 in | Wt 204.0 lb

## 2018-11-05 DIAGNOSIS — Z7901 Long term (current) use of anticoagulants: Secondary | ICD-10-CM

## 2018-11-05 DIAGNOSIS — I351 Nonrheumatic aortic (valve) insufficiency: Secondary | ICD-10-CM

## 2018-11-05 DIAGNOSIS — I43 Cardiomyopathy in diseases classified elsewhere: Secondary | ICD-10-CM

## 2018-11-05 DIAGNOSIS — Z0000 Encounter for general adult medical examination without abnormal findings: Secondary | ICD-10-CM

## 2018-11-05 DIAGNOSIS — E785 Hyperlipidemia, unspecified: Secondary | ICD-10-CM | POA: Diagnosis not present

## 2018-11-05 DIAGNOSIS — I119 Hypertensive heart disease without heart failure: Secondary | ICD-10-CM | POA: Diagnosis not present

## 2018-11-05 DIAGNOSIS — Z79899 Other long term (current) drug therapy: Secondary | ICD-10-CM | POA: Diagnosis not present

## 2018-11-05 DIAGNOSIS — Z86718 Personal history of other venous thrombosis and embolism: Secondary | ICD-10-CM | POA: Diagnosis not present

## 2018-11-05 DIAGNOSIS — R7301 Impaired fasting glucose: Secondary | ICD-10-CM

## 2018-11-05 DIAGNOSIS — Z800 Family history of malignant neoplasm of digestive organs: Secondary | ICD-10-CM

## 2018-11-05 LAB — LIPID PANEL
Cholesterol: 157 mg/dL (ref 0–200)
HDL: 40 mg/dL (ref >39.00)
LDL Cholesterol: 88 mg/dL (ref 0–99)
NonHDL: 116.86
Total CHOL/HDL Ratio: 4
Triglycerides: 144 mg/dL (ref 0.0–149.0)
VLDL: 28.8 mg/dL (ref 0.0–40.0)

## 2018-11-05 LAB — HEMOGLOBIN A1C: HEMOGLOBIN A1C: 5.9 % (ref 4.6–6.5)

## 2018-11-05 LAB — CBC WITH DIFFERENTIAL/PLATELET
BASOS ABS: 0 10*3/uL (ref 0.0–0.1)
Basophils Relative: 0.6 % (ref 0.0–3.0)
EOS ABS: 0.1 10*3/uL (ref 0.0–0.7)
Eosinophils Relative: 0.8 % (ref 0.0–5.0)
HCT: 39.7 % (ref 36.0–46.0)
Hemoglobin: 13.2 g/dL (ref 12.0–15.0)
LYMPHS ABS: 3 10*3/uL (ref 0.7–4.0)
LYMPHS PCT: 44.6 % (ref 12.0–46.0)
MCHC: 33.2 g/dL (ref 30.0–36.0)
MCV: 87.4 fl (ref 78.0–100.0)
MONO ABS: 0.4 10*3/uL (ref 0.1–1.0)
Monocytes Relative: 5.5 % (ref 3.0–12.0)
NEUTROS ABS: 3.2 10*3/uL (ref 1.4–7.7)
Neutrophils Relative %: 48.5 % (ref 43.0–77.0)
PLATELETS: 198 10*3/uL (ref 150.0–400.0)
RBC: 4.54 Mil/uL (ref 3.87–5.11)
RDW: 13.9 % (ref 11.5–15.5)
WBC: 6.7 10*3/uL (ref 4.0–10.5)

## 2018-11-05 LAB — TSH: TSH: 1.33 u[IU]/mL (ref 0.35–4.50)

## 2018-11-05 LAB — BASIC METABOLIC PANEL
BUN: 15 mg/dL (ref 6–23)
CALCIUM: 9.4 mg/dL (ref 8.4–10.5)
CO2: 29 meq/L (ref 19–32)
CREATININE: 1.46 mg/dL — AB (ref 0.40–1.20)
Chloride: 104 mEq/L (ref 96–112)
GFR: 43.31 mL/min — ABNORMAL LOW (ref >60.00)
GLUCOSE: 81 mg/dL (ref 70–99)
Potassium: 3.3 mEq/L — ABNORMAL LOW (ref 3.5–5.1)
Sodium: 143 mEq/L (ref 135–145)

## 2018-11-05 LAB — HEPATIC FUNCTION PANEL
ALT: 15 U/L (ref 0–35)
AST: 16 U/L (ref 0–37)
Albumin: 4.4 g/dL (ref 3.5–5.2)
Alkaline Phosphatase: 121 U/L — ABNORMAL HIGH (ref 39–117)
BILIRUBIN DIRECT: 0.1 mg/dL (ref 0.0–0.3)
BILIRUBIN TOTAL: 0.5 mg/dL (ref 0.2–1.2)
Total Protein: 7.3 g/dL (ref 6.0–8.3)

## 2018-11-05 MED ORDER — ALBUTEROL SULFATE HFA 108 (90 BASE) MCG/ACT IN AERS: 2.0000 | INHALATION_SPRAY | Freq: Four times a day (QID) | RESPIRATORY_TRACT | 2 refills | Status: DC | PRN

## 2018-11-05 MED ORDER — WARFARIN SODIUM 5 MG PO TABS: ORAL_TABLET | ORAL | 3 refills | Status: DC

## 2018-11-05 NOTE — Patient Instructions (Addendum)
Get your colonscopy  Let us know if we need to help you with this.  Will notify you  of labs when available.   lifestyle intervention healthy eating and exercise .  Limit or nix sweet beverages .   If all ok yearly cpx   Preventive Care 70 Years and Older, Female Preventive care refers to lifestyle choices and visits with your health care provider that can promote health and wellness. What does preventive care include?  A yearly physical exam. This is also called an annual well check.  Dental exams once or twice a year.  Routine eye exams. Ask your health care provider how often you should have your eyes checked.  Personal lifestyle choices, including: ? Daily care of your teeth and gums. ? Regular physical activity. ? Eating a healthy diet. ? Avoiding tobacco and drug use. ? Limiting alcohol use. ? Practicing safe sex. ? Taking low-dose aspirin every day. ? Taking vitamin and mineral supplements as recommended by your health care provider. What happens during an annual well check? The services and screenings done by your health care provider during your annual well check will depend on your age, overall health, lifestyle risk factors, and family history of disease. Counseling Your health care provider may ask you questions about your:  Alcohol use.  Tobacco use.  Drug use.  Emotional well-being.  Home and relationship well-being.  Sexual activity.  Eating habits.  History of falls.  Memory and ability to understand (cognition).  Work and work Statistician.  Reproductive health.  Screening You may have the following tests or measurements:  Height, weight, and BMI.  Blood pressure.  Lipid and cholesterol levels. These may be checked every 5 years, or more frequently if you are over 51 years old.  Skin check.  Lung cancer screening. You may have this screening every year starting at age 48 if you have a 30-pack-year history of smoking and currently smoke  or have quit within the past 15 years.  Colorectal cancer screening. All adults should have this screening starting at age 53 and continuing until age 50. You will have tests every 1-10 years, depending on your results and the type of screening test. People at increased risk should start screening at an earlier age. Screening tests may include: ? Guaiac-based fecal occult blood testing. ? Fecal immunochemical test (FIT). ? Stool DNA test. ? Virtual colonoscopy. ? Sigmoidoscopy. During this test, a flexible tube with a tiny camera (sigmoidoscope) is used to examine your rectum and lower colon. The sigmoidoscope is inserted through your anus into your rectum and lower colon. ? Colonoscopy. During this test, a long, thin, flexible tube with a tiny camera (colonoscope) is used to examine your entire colon and rectum.  Hepatitis C blood test.  Hepatitis B blood test.  Sexually transmitted disease (STD) testing.  Diabetes screening. This is done by checking your blood sugar (glucose) after you have not eaten for a while (fasting). You may have this done every 1-3 years.  Bone density scan. This is done to screen for osteoporosis. You may have this done starting at age 44.  Mammogram. This may be done every 1-2 years. Talk to your health care provider about how often you should have regular mammograms. Talk with your health care provider about your test results, treatment options, and if necessary, the need for more tests. Vaccines Your health care provider may recommend certain vaccines, such as:  Influenza vaccine. This is recommended every year.  Tetanus, diphtheria, and  acellular pertussis (Tdap, Td) vaccine. You may need a Td booster every 10 years.  Varicella vaccine. You may need this if you have not been vaccinated.  Zoster vaccine. You may need this after age 28.  Measles, mumps, and rubella (MMR) vaccine. You may need at least one dose of MMR if you were born in 1957 or later. You  may also need a second dose.  Pneumococcal 13-valent conjugate (PCV13) vaccine. One dose is recommended after age 29.  Pneumococcal polysaccharide (PPSV23) vaccine. One dose is recommended after age 38.  Meningococcal vaccine. You may need this if you have certain conditions.  Hepatitis A vaccine. You may need this if you have certain conditions or if you travel or work in places where you may be exposed to hepatitis A.  Hepatitis B vaccine. You may need this if you have certain conditions or if you travel or work in places where you may be exposed to hepatitis B.  Haemophilus influenzae type b (Hib) vaccine. You may need this if you have certain conditions. Talk to your health care provider about which screenings and vaccines you need and how often you need them. This information is not intended to replace advice given to you by your health care provider. Make sure you discuss any questions you have with your health care provider. Document Released: 10/15/2015 Document Revised: 11/08/2017 Document Reviewed: 07/20/2015 Elsevier Interactive Patient Education  2019 [employer].

## 2018-11-05 NOTE — Telephone Encounter (Signed)
Please advise pt requesting refill

## 2018-11-05 NOTE — Telephone Encounter (Signed)
Prescription [facility]

## 2018-11-05 NOTE — Telephone Encounter (Signed)
The patient needs [facility] added to her list of Pharmacies   The 2 Rx that was sent to [facility] today needs to be canceled and sent to [facility]   [address] [place] [address]

## 2018-11-06 ENCOUNTER — Other Ambulatory Visit: Payer: Self-pay | Admitting: Cardiology

## 2018-11-06 MED ORDER — RAMIPRIL 10 MG PO CAPS: ORAL_CAPSULE | ORAL | 1 refills | Status: DC

## 2018-11-06 MED ORDER — METOPROLOL TARTRATE 50 MG PO TABS: 50.0000 mg | ORAL_TABLET | Freq: Two times a day (BID) | ORAL | 1 refills | Status: DC

## 2018-11-06 MED ORDER — AMLODIPINE BESYLATE 10 MG PO TABS: 10.0000 mg | ORAL_TABLET | Freq: Every day | ORAL | 1 refills | Status: DC

## 2018-11-06 MED ORDER — HYDRALAZINE HCL 50 MG PO TABS: 50.0000 mg | ORAL_TABLET | Freq: Three times a day (TID) | ORAL | 1 refills | Status: DC

## 2018-11-06 MED ORDER — POTASSIUM CHLORIDE CRYS ER 20 MEQ PO TBCR: 20.0000 meq | EXTENDED_RELEASE_TABLET | Freq: Every day | ORAL | 1 refills | Status: DC

## 2018-11-06 MED ORDER — ATORVASTATIN CALCIUM 20 MG PO TABS: 20.0000 mg | ORAL_TABLET | Freq: Every day | ORAL | 1 refills | Status: DC

## 2018-11-06 NOTE — Telephone Encounter (Signed)
New Message    *STAT* If patient is at the pharmacy, call can be transferred to refill team.   1. Which medications need to be refilled? (please list name of each medication and dose if known) Metoprolol 50mg , Ramipril 10mg , atorvastatin 20mg , amlodipine 10mg , furosemide 40mg , klor-con m20 and hydralazine 50mg   2. Which pharmacy/location (including street and city if local pharmacy) is medication to be sent to? [facility]   3. Do they need a 30 day or 90 day supply? 90 day supply

## 2018-11-12 ENCOUNTER — Other Ambulatory Visit: Payer: Self-pay

## 2018-11-12 DIAGNOSIS — R748 Abnormal levels of other serum enzymes: Secondary | ICD-10-CM

## 2018-11-12 DIAGNOSIS — E876 Hypokalemia: Secondary | ICD-10-CM

## 2018-11-14 ENCOUNTER — Other Ambulatory Visit: Payer: Self-pay | Admitting: *Deleted

## 2018-11-14 NOTE — Telephone Encounter (Signed)
[facility] left a msg on the refill vm requesting an rx for furosemide. They are also requesting a call back at [phone] as they have questions regarding the hydralazine rx. Thanks, MI

## 2018-11-14 NOTE — Telephone Encounter (Signed)
Author phoned pt. to follow up on awv scheduling. No answer; author left VM asking for return call if questions about awv or interest in scheduling.

## 2018-11-15 MED ORDER — FUROSEMIDE 40 MG PO TABS: 40.0000 mg | ORAL_TABLET | Freq: Every day | ORAL | 2 refills | Status: DC

## 2018-11-19 ENCOUNTER — Telehealth: Payer: Self-pay | Admitting: Internal Medicine

## 2018-11-19 ENCOUNTER — Other Ambulatory Visit: Payer: Self-pay | Admitting: Cardiology

## 2018-11-19 ENCOUNTER — Other Ambulatory Visit: Payer: Self-pay | Admitting: General Practice

## 2018-11-19 DIAGNOSIS — Z7901 Long term (current) use of anticoagulants: Secondary | ICD-10-CM

## 2018-11-19 MED ORDER — WARFARIN SODIUM 5 MG PO TABS: ORAL_TABLET | ORAL | 1 refills | Status: DC

## 2018-11-19 MED ORDER — HYDRALAZINE HCL 50 MG PO TABS: 50.0000 mg | ORAL_TABLET | Freq: Three times a day (TID) | ORAL | 3 refills | Status: DC

## 2018-11-19 NOTE — Telephone Encounter (Signed)
Please review Rx for more specific instructions insurance will not accept:as directed

## 2018-11-19 NOTE — Telephone Encounter (Signed)
° ° ° °*  STAT* If patient is at the pharmacy, call can be transferred to refill team.   1. Which medications need to be refilled? (please list name of each medication and dose if known) hydrALAZINE (APRESOLINE) 50 MG tablet  2. Which pharmacy/location (including street and city if local pharmacy) is medication to be sent to? [facility]  3. Do they need a 30 day or 90 day supply? [place]

## 2018-11-19 NOTE — Telephone Encounter (Signed)
This has been addressed.

## 2018-11-19 NOTE — Telephone Encounter (Signed)
Rx(s) sent to pharmacy electronically.  

## 2018-11-19 NOTE — Telephone Encounter (Signed)
Copied from [place] [id number]. Topic: Quick Communication - Rx Refill/Question >> Nov 19, 2018 10:16 AM Virl Axe D wrote: Medication: warfarin (COUMADIN) 5 MG tablet / Pharmacy stated that in order to bill insurance they need specific instructions for rx regarding how many tablets to take. CB# [phone] Ref# [phone] Please advise  Has the patient contacted their pharmacy? Yes.   (Agent: If no, request that the patient contact the pharmacy for the refill.) (Agent: If yes, when and what did the pharmacy advise?)  Preferred Pharmacy (with phone number or street name): [place], [place] [phone] (Phone) [phone] (Fax)  Agent: Please be advised that RX refills may take up to 3 business days. We ask that you follow-up with your pharmacy.

## 2018-11-25 ENCOUNTER — Telehealth: Payer: Self-pay

## 2018-11-25 NOTE — Telephone Encounter (Signed)
PA for albuterol (PROVENTIL HFA;VENTOLIN HFA) has been sent to cover my meds.  Key: [id number]

## 2018-11-26 NOTE — Telephone Encounter (Signed)
Ok to do [employer]   But inform  Patient  of  The situation

## 2018-11-26 NOTE — Telephone Encounter (Signed)
PA has been denied.  Denial letter states that the patient must try and fail either Proair HFA or Proair respiClick.

## 2018-11-27 ENCOUNTER — Other Ambulatory Visit: Payer: Self-pay

## 2018-11-27 MED ORDER — ALBUTEROL SULFATE 108 (90 BASE) MCG/ACT IN AEPB: 2.0000 | INHALATION_SPRAY | Freq: Four times a day (QID) | RESPIRATORY_TRACT | 1 refills | Status: AC | PRN

## 2018-11-27 NOTE — Telephone Encounter (Signed)
Unable to leave voicemail due to mailbox being full . Okay for nurse triage to disclose. New prescription sent in . CRM created

## 2018-12-12 ENCOUNTER — Ambulatory Visit: Payer: Medicare Other

## 2019-01-02 ENCOUNTER — Other Ambulatory Visit (INDEPENDENT_AMBULATORY_CARE_PROVIDER_SITE_OTHER): Payer: Medicare Other

## 2019-01-02 ENCOUNTER — Other Ambulatory Visit: Payer: Self-pay

## 2019-01-02 ENCOUNTER — Ambulatory Visit (INDEPENDENT_AMBULATORY_CARE_PROVIDER_SITE_OTHER): Payer: Medicare Other | Admitting: General Practice

## 2019-01-02 DIAGNOSIS — Z7901 Long term (current) use of anticoagulants: Secondary | ICD-10-CM | POA: Diagnosis not present

## 2019-01-02 DIAGNOSIS — I82409 Acute embolism and thrombosis of unspecified deep veins of unspecified lower extremity: Secondary | ICD-10-CM

## 2019-01-02 LAB — POCT INR: INR: 3.5 — AB (ref 2.0–3.0)

## 2019-01-02 NOTE — Patient Instructions (Signed)
Pre visit review using our clinic review tool, if applicable. No additional management support is needed unless otherwise documented below in the visit note.  Please skip dosage today (4/2) and then continue to take the same dose (5mg  daily) and recheck in 4 weeks.    I asked patient to schedule a physical with Dr. Regis Bill.

## 2019-01-17 ENCOUNTER — Other Ambulatory Visit: Payer: Self-pay

## 2019-01-17 ENCOUNTER — Other Ambulatory Visit (INDEPENDENT_AMBULATORY_CARE_PROVIDER_SITE_OTHER): Payer: Medicare Other

## 2019-01-17 DIAGNOSIS — E876 Hypokalemia: Secondary | ICD-10-CM | POA: Diagnosis not present

## 2019-01-17 DIAGNOSIS — R748 Abnormal levels of other serum enzymes: Secondary | ICD-10-CM | POA: Diagnosis not present

## 2019-01-17 LAB — BASIC METABOLIC PANEL
BUN: 16 mg/dL (ref 6–23)
CO2: 30 mEq/L (ref 19–32)
Calcium: 8.8 mg/dL (ref 8.4–10.5)
Chloride: 106 mEq/L (ref 96–112)
Creatinine, Ser: 1.43 mg/dL — ABNORMAL HIGH (ref 0.40–1.20)
GFR: 44.34 mL/min — ABNORMAL LOW (ref >60.00)
Glucose, Bld: 98 mg/dL (ref 70–99)
Potassium: 4.2 mEq/L (ref 3.5–5.1)
Sodium: 145 mEq/L (ref 135–145)

## 2019-01-17 LAB — VITAMIN D 25 HYDROXY (VIT D DEFICIENCY, FRACTURES): VITD: 22.73 ng/mL — ABNORMAL LOW (ref 30.00–100.00)

## 2019-01-17 LAB — ALKALINE PHOSPHATASE: Alkaline Phosphatase: 117 U/L (ref 39–117)

## 2019-01-17 LAB — MAGNESIUM: Magnesium: 2.1 mg/dL (ref 1.5–2.5)

## 2019-01-17 LAB — GAMMA GT: GGT: 12 U/L (ref 7–51)

## 2019-01-27 ENCOUNTER — Telehealth: Payer: Self-pay

## 2019-01-27 NOTE — Telephone Encounter (Signed)
Left detailed VM w COVID screen and curbside info   

## 2019-01-30 ENCOUNTER — Ambulatory Visit (INDEPENDENT_AMBULATORY_CARE_PROVIDER_SITE_OTHER): Payer: Medicare Other | Admitting: General Practice

## 2019-01-30 ENCOUNTER — Other Ambulatory Visit (INDEPENDENT_AMBULATORY_CARE_PROVIDER_SITE_OTHER): Payer: Medicare Other

## 2019-01-30 ENCOUNTER — Other Ambulatory Visit: Payer: Self-pay

## 2019-01-30 DIAGNOSIS — Z7901 Long term (current) use of anticoagulants: Secondary | ICD-10-CM

## 2019-01-30 DIAGNOSIS — I82409 Acute embolism and thrombosis of unspecified deep veins of unspecified lower extremity: Secondary | ICD-10-CM | POA: Diagnosis not present

## 2019-01-30 LAB — POCT INR: INR: 2.8 (ref 2.0–3.0)

## 2019-01-30 NOTE — Patient Instructions (Addendum)
Pre visit review using our clinic review tool, if applicable. No additional management support is needed unless otherwise documented below in the visit note.  Continue to take 1 (5 mg) tablet daily.  Re-check in 4 weeks.      

## 2019-02-27 ENCOUNTER — Other Ambulatory Visit (INDEPENDENT_AMBULATORY_CARE_PROVIDER_SITE_OTHER): Payer: Medicare Other

## 2019-02-27 ENCOUNTER — Ambulatory Visit (INDEPENDENT_AMBULATORY_CARE_PROVIDER_SITE_OTHER): Payer: Medicare Other | Admitting: General Practice

## 2019-02-27 ENCOUNTER — Other Ambulatory Visit: Payer: Self-pay

## 2019-02-27 DIAGNOSIS — Z86718 Personal history of other venous thrombosis and embolism: Secondary | ICD-10-CM

## 2019-02-27 DIAGNOSIS — I82403 Acute embolism and thrombosis of unspecified deep veins of lower extremity, bilateral: Secondary | ICD-10-CM

## 2019-02-27 DIAGNOSIS — Z7901 Long term (current) use of anticoagulants: Secondary | ICD-10-CM

## 2019-02-27 LAB — POCT INR: INR: 3.6 — AB (ref 2.0–3.0)

## 2019-02-27 NOTE — Patient Instructions (Addendum)
Pre visit review using our clinic review tool, if applicable. No additional management support is needed unless otherwise documented below in the visit note.  Hold coumadin today (5/28) and then change dosage and take  1 (5 mg) tablet daily except take 1/2 tablet on Wednesdays.   Re-check in 3 weeks.  LMOVM

## 2019-03-13 ENCOUNTER — Other Ambulatory Visit: Payer: Self-pay | Admitting: Cardiology

## 2019-03-20 ENCOUNTER — Ambulatory Visit (INDEPENDENT_AMBULATORY_CARE_PROVIDER_SITE_OTHER): Payer: Medicare Other | Admitting: General Practice

## 2019-03-20 ENCOUNTER — Other Ambulatory Visit: Payer: Self-pay | Admitting: Cardiology

## 2019-03-20 ENCOUNTER — Other Ambulatory Visit: Payer: Medicare Other

## 2019-03-20 ENCOUNTER — Other Ambulatory Visit: Payer: Self-pay

## 2019-03-20 DIAGNOSIS — Z7901 Long term (current) use of anticoagulants: Secondary | ICD-10-CM | POA: Diagnosis not present

## 2019-03-20 LAB — POCT INR: INR: 2 (ref 2.0–3.0)

## 2019-03-20 NOTE — Patient Instructions (Addendum)
Pre visit review using our clinic review tool, if applicable. No additional management support is needed unless otherwise documented below in the visit note.  Continue to take  1 (5 mg) tablet daily except take 1/2 tablet on Wednesdays.   Re-check in 4 weeks.

## 2019-04-04 ENCOUNTER — Other Ambulatory Visit: Payer: Self-pay

## 2019-04-06 ENCOUNTER — Other Ambulatory Visit: Payer: Self-pay

## 2019-04-14 ENCOUNTER — Other Ambulatory Visit: Payer: Self-pay

## 2019-04-14 ENCOUNTER — Ambulatory Visit (HOSPITAL_COMMUNITY): Payer: Medicare Other | Attending: Internal Medicine

## 2019-04-14 DIAGNOSIS — I351 Nonrheumatic aortic (valve) insufficiency: Secondary | ICD-10-CM | POA: Diagnosis not present

## 2019-04-15 ENCOUNTER — Telehealth: Payer: Self-pay

## 2019-04-15 NOTE — Progress Notes (Deleted)
Virtual Visit via Video Note   This visit type was conducted due to national recommendations for restrictions regarding the COVID-19 Pandemic (e.g. social distancing) in an effort to limit this patient's exposure and mitigate transmission in our community.  Due to her co-morbid illnesses, this patient is at least at moderate risk for complications without adequate follow up.  This format is felt to be most appropriate for this patient at this time.  All issues noted in this document were discussed and addressed.  A limited physical exam was performed with this format.  Please refer to the patient's chart for her consent to telehealth for [facility].   Date:  04/15/2019   ID:  [name] Foster, DOB 24-Nov-1950, MRN [id number]  Patient Location:Home Provider Location: Home  PCP:  Burnis Medin, MD  Cardiologist:  Dr Stanford Breed  Evaluation Performed:  Follow-Up Visit  Chief Complaint:  FU NICM and AI  History of Present Illness:    FU nonischemic cardiomyopathy andaortic insufficiency. Previous catheterization in 2004 showed no obstructive coronary disease. CT of her chest in Nov 2008 showed no thoracic aneurysm. Previous Holter monitor secondary to "dizzy spells" showed PACs and PVCs. Nuclear study April 2014 showed an ejection fraction of 62% and normal perfusion. Patient underwent transesophageal echocardiogram in May of 2014. Her ejection fraction was 50-55%. There was moderate aortic insufficiency and mild mitral regurgitation. There was a linear density associated with the atrial septum of uncertain etiology. Patient has had occasional noncompliance in the past with medications.  If blood pressure not controlled LV function noted to be reduced with worsening aortic insufficiency.    Echocardiogram July 2020 showed normal LV function, mild left ventricular hypertrophy, mild diastolic dysfunction, moderate left atrial enlargement, mild to moderate aortic insufficiency.  Since I last saw  her,   The patient does not have symptoms concerning for COVID-19 infection (fever, chills, cough, or new shortness of breath).    Past Medical History:  Diagnosis Date  . Allergic rhinitis   . Aortic valve disorders   . Asthma   . Hyperlipidemia   . Hypertension   . Long term (current) use of anticoagulants   . Other primary cardiomyopathies   . Personal history of colonic polyps   . Personal history of venous thrombosis and embolism    Past Surgical History:  Procedure Laterality Date  . ABDOMINAL HYSTERECTOMY    . CARDIAC CATHETERIZATION    . DILATION AND CURETTAGE OF UTERUS     several  . Rt knee arthoscopic    . TEE WITHOUT CARDIOVERSION N/A 02/21/2013   Procedure: TRANSESOPHAGEAL ECHOCARDIOGRAM (TEE);  Surgeon: Lelon Perla, MD;  Location: [facility];  Service: Cardiovascular;  Laterality: N/A;     No outpatient medications have been marked as taking for the 04/21/19 encounter (Appointment) with Lelon Perla, MD.     Allergies:   Aspirin, Penicillins, and Tetanus toxoid   Social History   Tobacco Use  . Smoking status: Former Smoker    Packs/day: 0.25    Quit date: 10/21/2013    Years since quitting: 5.4  . Smokeless tobacco: Never Used  Substance Use Topics  . Alcohol use: Yes    Comment: rare occasion  . Drug use: No     Family Hx: The patient's family history includes Alcohol abuse in an other family member; Breast cancer in an other family member; Colon cancer in an other family member; Depression in an other family member; Hyperlipidemia in an other family member; Hypertension  in an other family member; Kidney disease in an other family member; Stroke in an other family member; Uterine cancer in an other family member.  ROS:   Please see the history of present illness.    No Fever, chills  or productive cough All other systems reviewed and are negative.   Recent Labs: 11/05/2018: ALT 15; Hemoglobin 13.2; Platelets 198.0; TSH 1.33 01/17/2019:  BUN 16; Creatinine, Ser 1.43; Magnesium 2.1; Potassium 4.2; Sodium 145   Recent Lipid Panel Lab Results  Component Value Date/Time   CHOL 157 11/05/2018 10:05 AM   TRIG 144.0 11/05/2018 10:05 AM   TRIG 92 01/03/2010   HDL 40.00 11/05/2018 10:05 AM   CHOLHDL 4 11/05/2018 10:05 AM   LDLCALC 88 11/05/2018 10:05 AM   LDLDIRECT 175.9 07/01/2007 10:48 AM    Wt Readings from Last 3 Encounters:  11/05/18 204 lb (92.5 kg)  10/14/18 205 lb (93 kg)  11/27/17 195 lb (88.5 kg)     Objective:    Vital Signs:  There were no vitals taken for this visit.   VITAL SIGNS:  reviewed NAD Answers questions appropriately Normal affect Remainder of physical examination not performed (telehealth visit; coronavirus pandemic)  ASSESSMENT & PLAN:    1. Aortic insufficiency-mild to moderate on most recent echocardiogram.  Her LV function and aortic insufficiency have been worse in the past when she has not been compliant with her blood pressure medications. 2. History of nonischemic cardiomyopathy-LV function improves when patient is compliant with her medical regimen and blood pressure is controlled.  Continue ACE inhibitor, beta-blocker and hydralazine. 3. Hypertension-patient's blood pressure is controlled.  Continue present medications and follow. 4. Hyperlipidemia-continue statin. 5. History of DVT-continue anticoagulation.  This is managed by primary care.  COVID-19 Education: The importance of social distancing was discussed today.  Time:   Today, I have spent 18 minutes with the patient with telehealth technology discussing the above problems.     Medication Adjustments/Labs and Tests Ordered: Current medicines are reviewed at length with the patient today.  Concerns regarding medicines are outlined above.   Tests Ordered: No orders of the defined types were placed in this encounter.   Medication Changes: No orders of the defined types were placed in this encounter.   Follow Up:   {F/U Format:[phone]} {follow up:15908}  Signed, Kirk Ruths, MD  04/15/2019 12:55 PM    [place]

## 2019-04-15 NOTE — Telephone Encounter (Signed)

## 2019-04-17 ENCOUNTER — Ambulatory Visit: Payer: Medicare Other

## 2019-04-18 ENCOUNTER — Telehealth: Payer: Self-pay | Admitting: Cardiology

## 2019-04-18 NOTE — Telephone Encounter (Signed)
I called pt, mailbox was full.

## 2019-04-21 ENCOUNTER — Telehealth: Payer: Medicare Other | Admitting: Cardiology

## 2019-04-24 ENCOUNTER — Ambulatory Visit (INDEPENDENT_AMBULATORY_CARE_PROVIDER_SITE_OTHER): Payer: Medicare Other | Admitting: General Practice

## 2019-04-24 DIAGNOSIS — Z7901 Long term (current) use of anticoagulants: Secondary | ICD-10-CM

## 2019-04-24 LAB — POCT INR: INR: 2 (ref 2.0–3.0)

## 2019-04-24 NOTE — Patient Instructions (Addendum)
Pre visit review using our clinic review tool, if applicable. No additional management support is needed unless otherwise documented below in the visit note.  Continue to take  1 (5 mg) tablet daily except take 1/2 tablet on Wednesdays.   Re-check in 4 weeks.

## 2019-05-21 ENCOUNTER — Other Ambulatory Visit: Payer: Self-pay | Admitting: Cardiology

## 2019-05-22 ENCOUNTER — Ambulatory Visit (INDEPENDENT_AMBULATORY_CARE_PROVIDER_SITE_OTHER): Payer: Medicare Other | Admitting: General Practice

## 2019-05-22 DIAGNOSIS — Z7901 Long term (current) use of anticoagulants: Secondary | ICD-10-CM | POA: Diagnosis not present

## 2019-05-22 LAB — POCT INR: INR: 2.7 (ref 2.0–3.0)

## 2019-05-22 NOTE — Patient Instructions (Addendum)
Pre visit review using our clinic review tool, if applicable. No additional management support is needed unless otherwise documented below in the visit note.  Continue to take  1 (5 mg) tablet daily except take 1/2 tablet on Wednesdays.   Re-check in 6 weeks.

## 2019-06-23 ENCOUNTER — Other Ambulatory Visit: Payer: Self-pay | Admitting: Internal Medicine

## 2019-06-23 ENCOUNTER — Other Ambulatory Visit: Payer: Self-pay

## 2019-06-23 DIAGNOSIS — Z7901 Long term (current) use of anticoagulants: Secondary | ICD-10-CM

## 2019-06-23 MED ORDER — WARFARIN SODIUM 5 MG PO TABS: ORAL_TABLET | ORAL | 1 refills | Status: DC

## 2019-06-23 NOTE — Telephone Encounter (Signed)
Please advise 

## 2019-06-25 ENCOUNTER — Other Ambulatory Visit: Payer: Self-pay | Admitting: General Practice

## 2019-06-25 DIAGNOSIS — Z7901 Long term (current) use of anticoagulants: Secondary | ICD-10-CM

## 2019-06-25 MED ORDER — WARFARIN SODIUM 5 MG PO TABS: ORAL_TABLET | ORAL | 1 refills | Status: DC

## 2019-07-03 ENCOUNTER — Ambulatory Visit (INDEPENDENT_AMBULATORY_CARE_PROVIDER_SITE_OTHER): Payer: Medicare Other | Admitting: General Practice

## 2019-07-03 DIAGNOSIS — Z23 Encounter for immunization: Secondary | ICD-10-CM | POA: Diagnosis not present

## 2019-07-03 DIAGNOSIS — Z7901 Long term (current) use of anticoagulants: Secondary | ICD-10-CM

## 2019-07-03 HISTORY — PX: BREAST BIOPSY: SHX20

## 2019-07-03 LAB — POCT INR: INR: 1.6 — AB (ref 2.0–3.0)

## 2019-07-03 NOTE — Patient Instructions (Signed)
Pre visit review using our clinic review tool, if applicable. No additional management support is needed unless otherwise documented below in the visit note.  Take 1 1/2 tablets today and tomorrow and then continue to take  1 (5 mg) tablet daily except take 1/2 tablet on Wednesdays.   Re-check in 4 weeks.

## 2019-07-08 ENCOUNTER — Other Ambulatory Visit: Payer: Self-pay | Admitting: Internal Medicine

## 2019-07-08 DIAGNOSIS — Z1231 Encounter for screening mammogram for malignant neoplasm of breast: Secondary | ICD-10-CM

## 2019-07-09 ENCOUNTER — Other Ambulatory Visit: Payer: Self-pay | Admitting: Internal Medicine

## 2019-07-09 ENCOUNTER — Other Ambulatory Visit: Payer: Self-pay

## 2019-07-09 ENCOUNTER — Ambulatory Visit
Admission: RE | Admit: 2019-07-09 | Discharge: 2019-07-09 | Disposition: A | Discharge status: Home / Self Care | Payer: Medicare Other | Source: Ambulatory Visit | Attending: Internal Medicine | Admitting: Internal Medicine

## 2019-07-09 DIAGNOSIS — Z1231 Encounter for screening mammogram for malignant neoplasm of breast: Secondary | ICD-10-CM

## 2019-07-09 DIAGNOSIS — N63 Unspecified lump in unspecified breast: Secondary | ICD-10-CM

## 2019-07-09 DIAGNOSIS — N644 Mastodynia: Secondary | ICD-10-CM

## 2019-07-24 ENCOUNTER — Ambulatory Visit
Admission: RE | Admit: 2019-07-24 | Discharge: 2019-07-24 | Disposition: A | Discharge status: Home / Self Care | Payer: Medicare Other | Source: Ambulatory Visit | Attending: Internal Medicine | Admitting: Internal Medicine

## 2019-07-24 ENCOUNTER — Other Ambulatory Visit: Payer: Self-pay | Admitting: Internal Medicine

## 2019-07-24 ENCOUNTER — Other Ambulatory Visit: Payer: Self-pay

## 2019-07-24 DIAGNOSIS — N6323 Unspecified lump in the left breast, lower outer quadrant: Secondary | ICD-10-CM | POA: Diagnosis not present

## 2019-07-24 DIAGNOSIS — R921 Mammographic calcification found on diagnostic imaging of breast: Secondary | ICD-10-CM | POA: Diagnosis not present

## 2019-07-24 DIAGNOSIS — N644 Mastodynia: Secondary | ICD-10-CM

## 2019-07-24 DIAGNOSIS — N63 Unspecified lump in unspecified breast: Secondary | ICD-10-CM

## 2019-07-24 DIAGNOSIS — R928 Other abnormal and inconclusive findings on diagnostic imaging of breast: Secondary | ICD-10-CM | POA: Diagnosis not present

## 2019-07-24 DIAGNOSIS — N6321 Unspecified lump in the left breast, upper outer quadrant: Secondary | ICD-10-CM | POA: Diagnosis not present

## 2019-07-24 DIAGNOSIS — R599 Enlarged lymph nodes, unspecified: Secondary | ICD-10-CM

## 2019-07-24 IMAGING — US US BREAST*L* LIMITED INC AXILLA
1 series · 13 of 25 positions shown · non-contrast
Comparison: Previous exam(s).

CLINICAL DATA: Palpable lump in the left breast.

EXAM:
DIGITAL DIAGNOSTIC BILATERAL MAMMOGRAM WITH CAD AND TOMO
ULTRASOUND LEFT BREAST

[Series 1: us breast*left* limited inc axilla · 0.09mm/px · 13 of 59 slices shown]
[im 1/59]
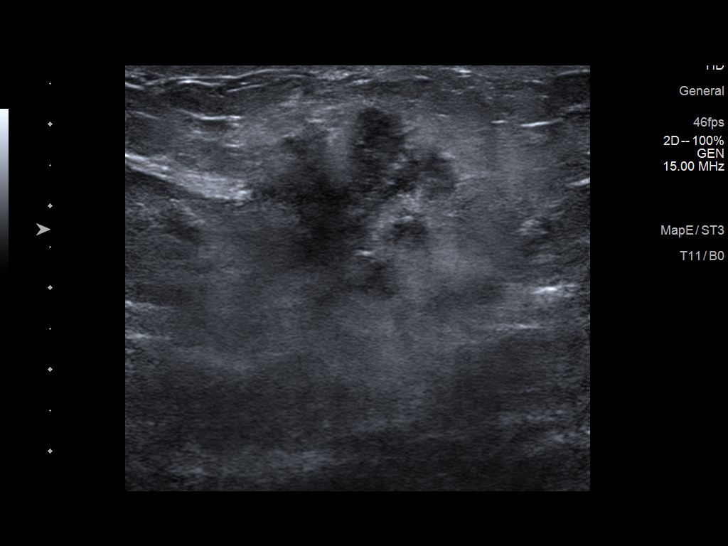
[im 5/59]
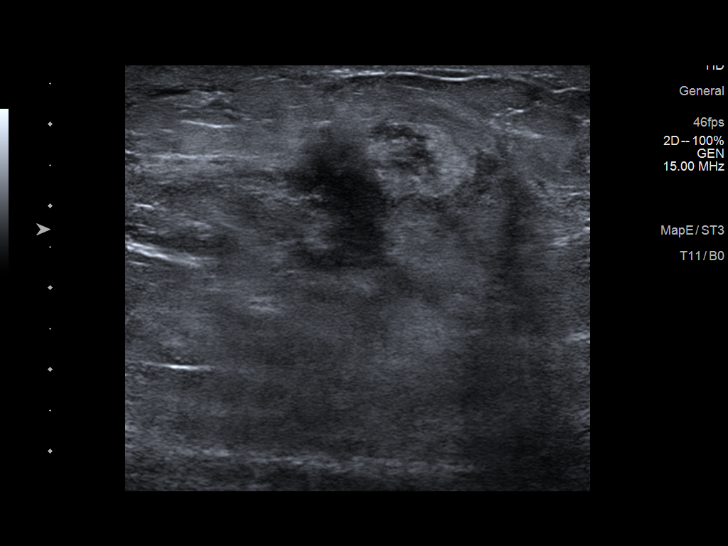
[im 10/59]
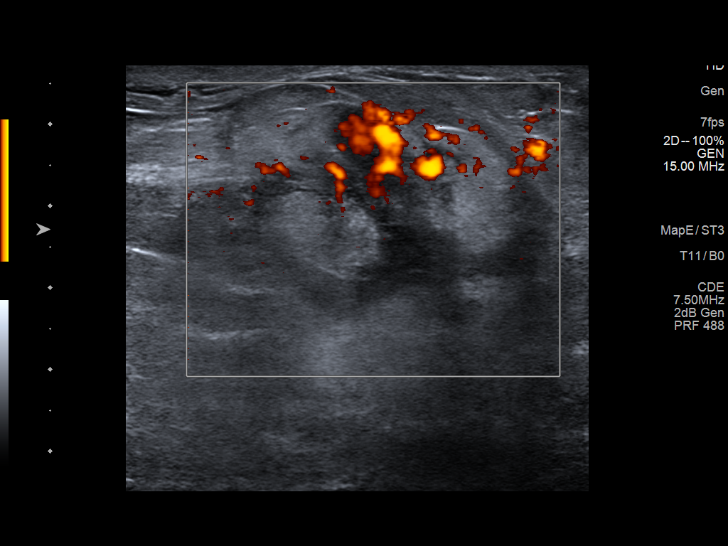
[im 15/59]
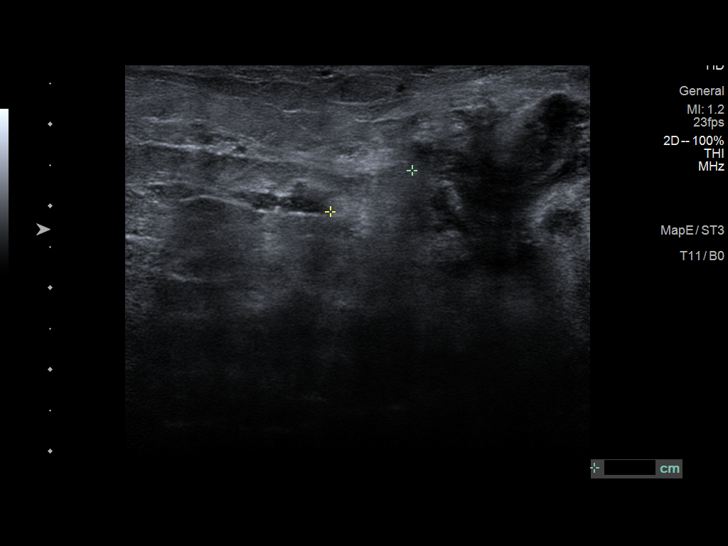
[im 20/59]
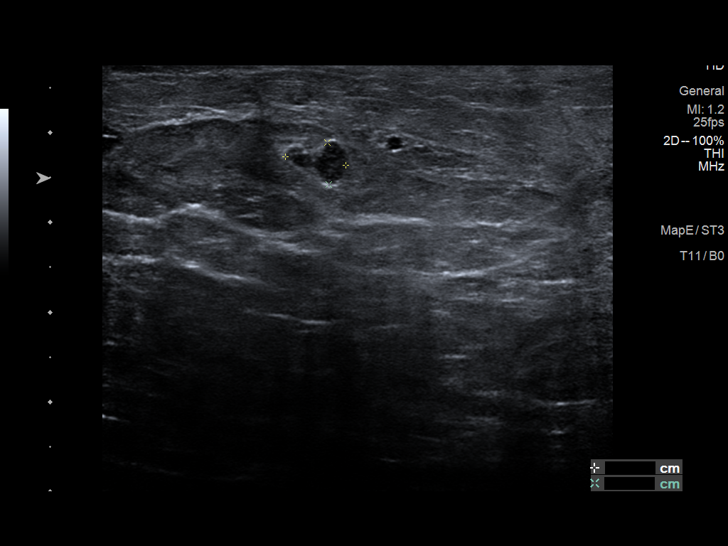
[im 25/59]
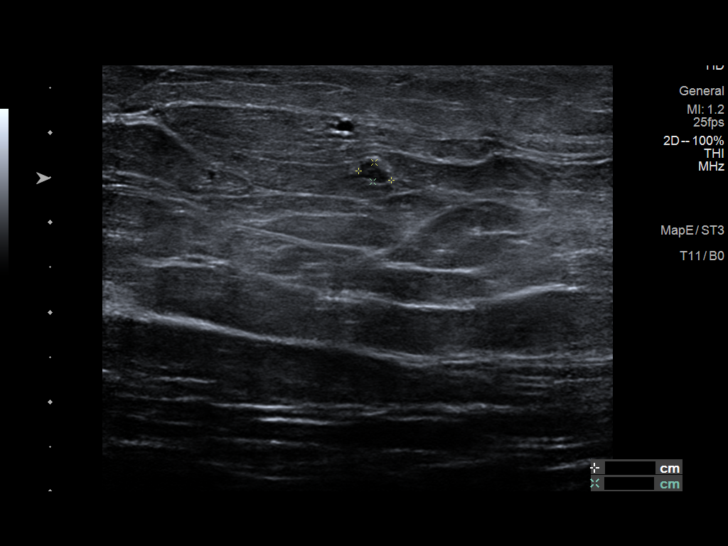
[im 30/59]
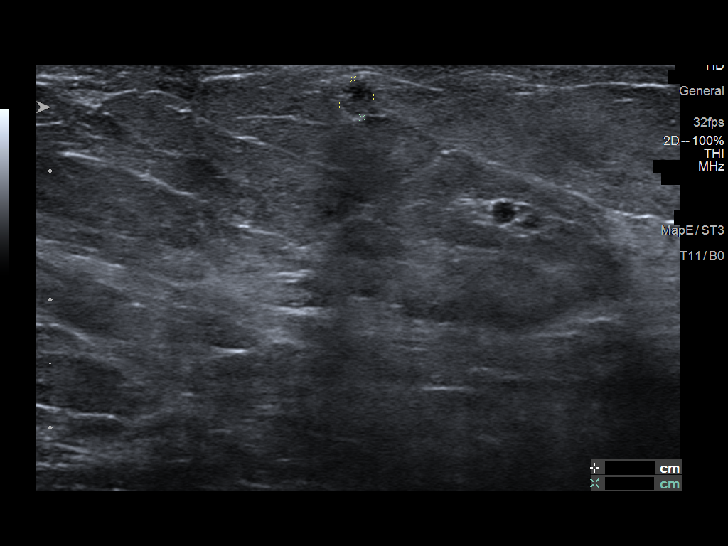
[im 34/59]
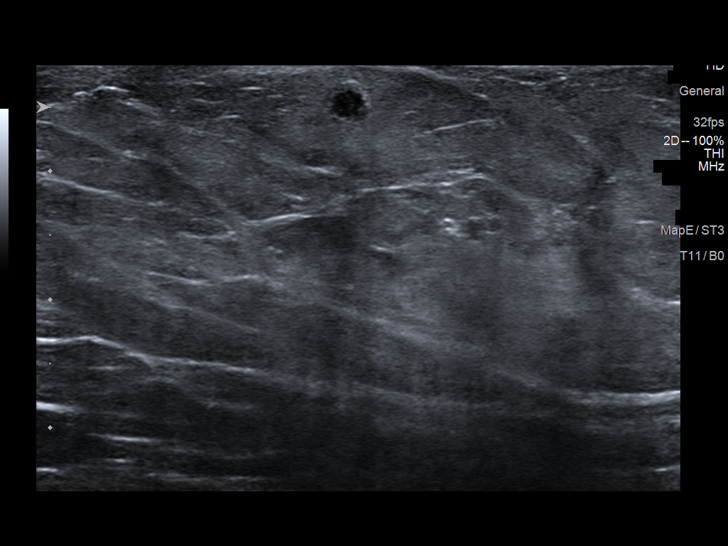
[im 39/59]
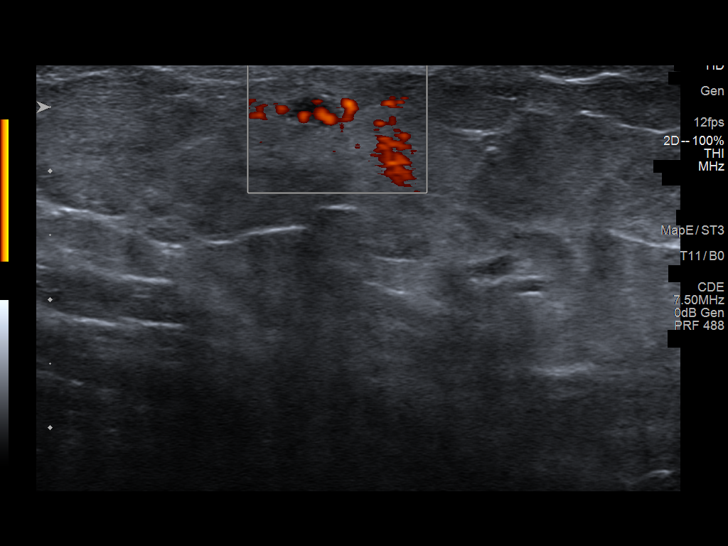
[im 44/59]
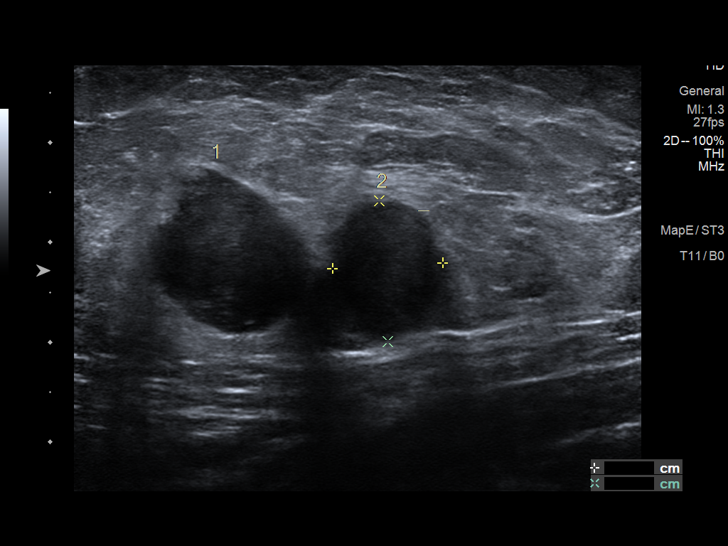
[im 49/59]
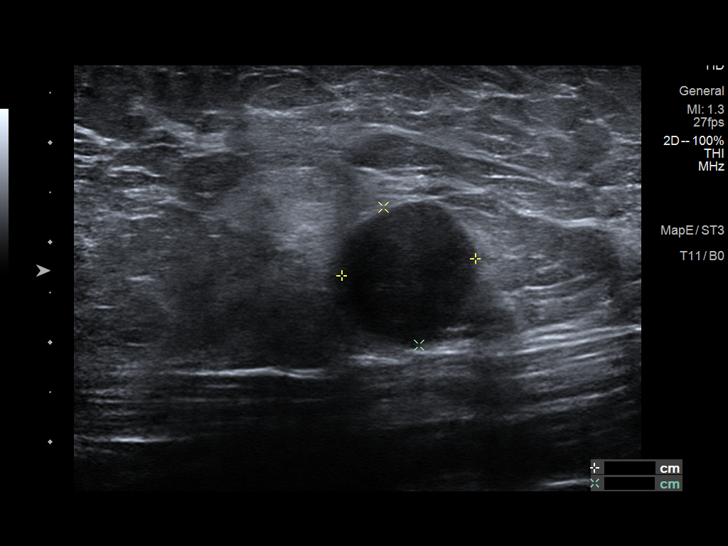
[im 54/59]
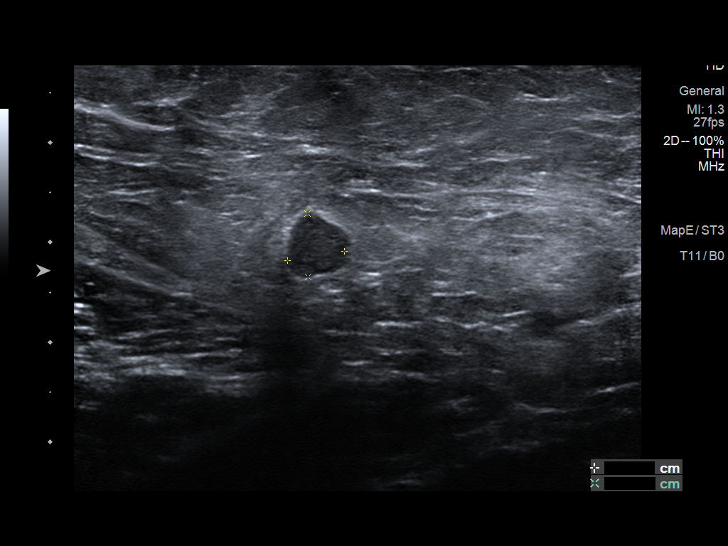
[im 59/59]
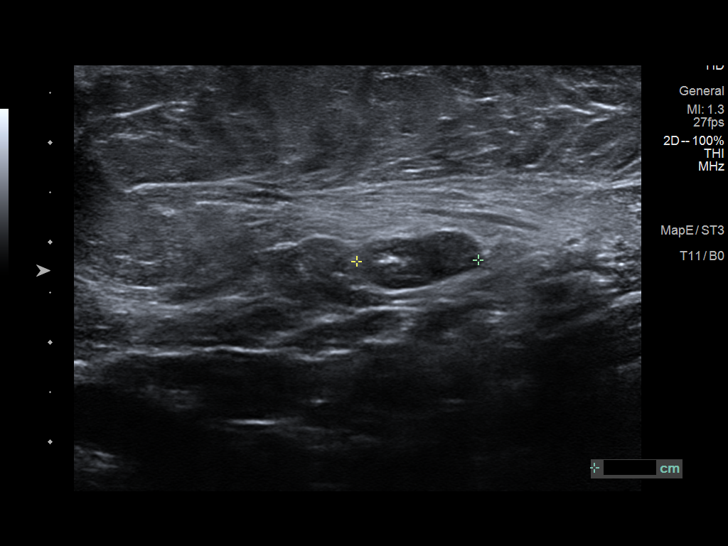

[13 of 25 positions shown; findings below may reference images not displayed]

ACR Breast Density Category b: There are scattered areas of
fibroglandular density.
FINDINGS: No suspicious masses, calcifications, or distortion identified in
the right breast.

There is skin thickening seen in the left breast anteriorly,
medially, and inferiorly. The patient is palpating a suspicious mass
measuring up to 3.4 cm, containing multiple calcifications.
Posterior to the mass are additional suspicious calcifications
spanning at least 4.4 cm. There are numerous other suspicious masses
in the upper outer left breast. There is an abnormal node in the
left axilla.

Mammographic images were processed with CAD.

On physical exam, a lump is felt in the lateral left breast.

Targeted ultrasound is performed, showing the patient's dominant
palpable mass in the left breast at [DATE], 8 cm from the nipple
measuring 2.8 x 2.8 by 3.6 cm. Numerous other masses are seen in the
upper outer left breast. There is a mass at 3 o'clock, 7 cm from the
nipple measuring 9 x 4 by 3 mm. There is a mass in the left breast
at 2 o'clock, 9 cm from the nipple measuring 7 x 5 x 7 mm. There is
a mass in the left breast at 2 o'clock, 8 cm from the nipple
measuring 4 x 2 by 5 mm. There is a mass in the left breast at 1
o'clock, 10 cm from the nipple measuring 3 x 3 by 2 mm. There is a
mass in the left breast at 1 o'clock, 11 cm from the nipple
measuring 3 x 3 x 4 mm. Greater than 4 abnormal left axillary lymph
nodes are identified.
IMPRESSION: Highly suspicious mass/masses in the left breast worrisome for
malignancy. No abnormalities on the right.

RECOMMENDATION:
Recommend biopsying the patient's palpable mass in the left breast
at [DATE], 8 cm from the nipple. Recommend biopsying the mass in the
left breast at 1 o'clock, 11 cm from the nipple to evaluate for
extent of disease. Recommend biopsying 1 of the abnormal enlarged
lymph nodes in the left axilla. If breast conservation is considered
after the biopsies, recommend biopsying the calcifications in the
left breast posterior to the palpable mass. These calcifications are
very suspicious.

I have discussed the findings and recommendations with the patient.
If applicable, a reminder letter will be sent to the patient
regarding the next appointment.

BI-RADS CATEGORY  5: Highly suggestive of malignancy.

## 2019-07-24 IMAGING — MG DIGITAL DIAGNOSTIC BILAT W/ TOMO W/ CAD
8 of 13 series · 8 of 33 positions shown · non-contrast
Comparison: Previous exam(s).

CLINICAL DATA: Palpable lump in the left breast.

EXAM:
DIGITAL DIAGNOSTIC BILATERAL MAMMOGRAM WITH CAD AND TOMO
ULTRASOUND LEFT BREAST

[L ML (1 of 2)]
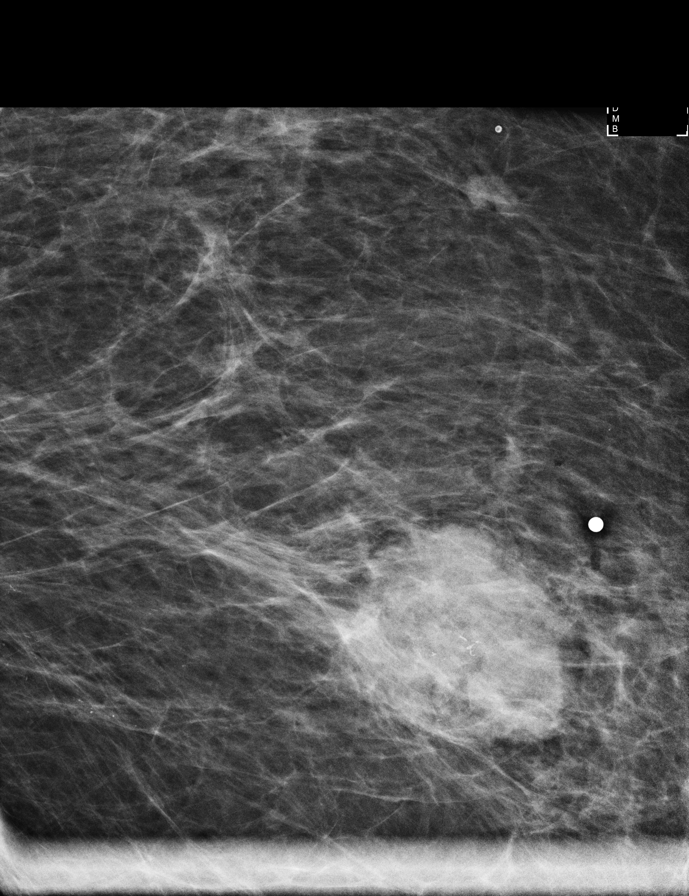

[L ML (2 of 2)]
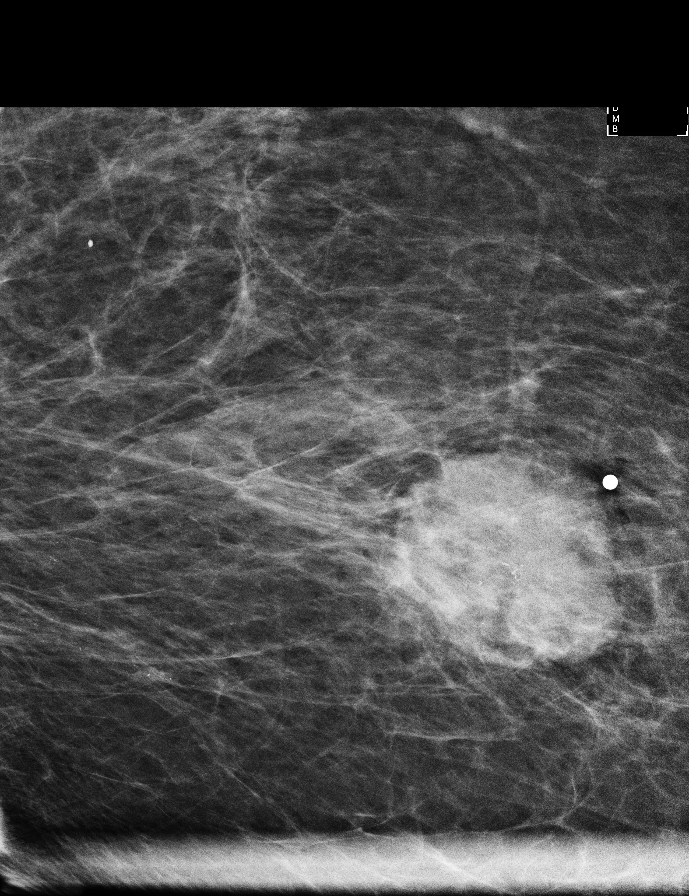

[L CC]
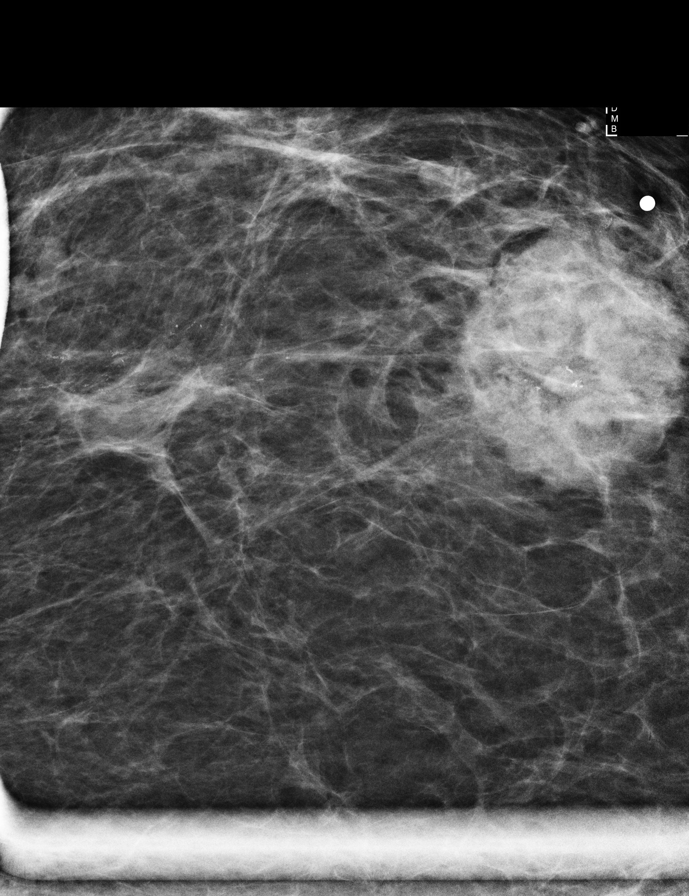

[L CC synth-2D]
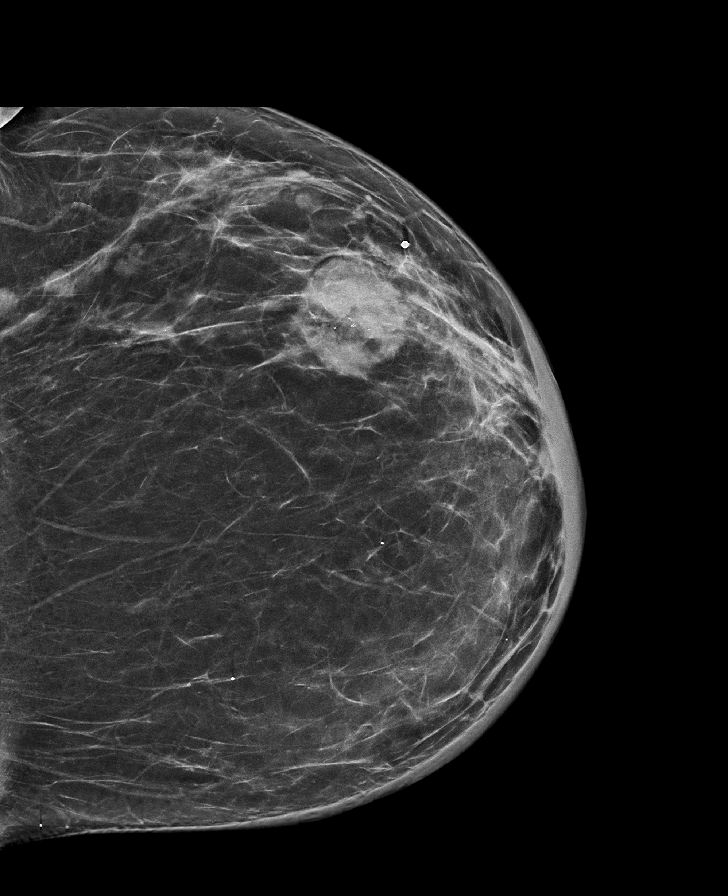

[L TAN synth-2D]
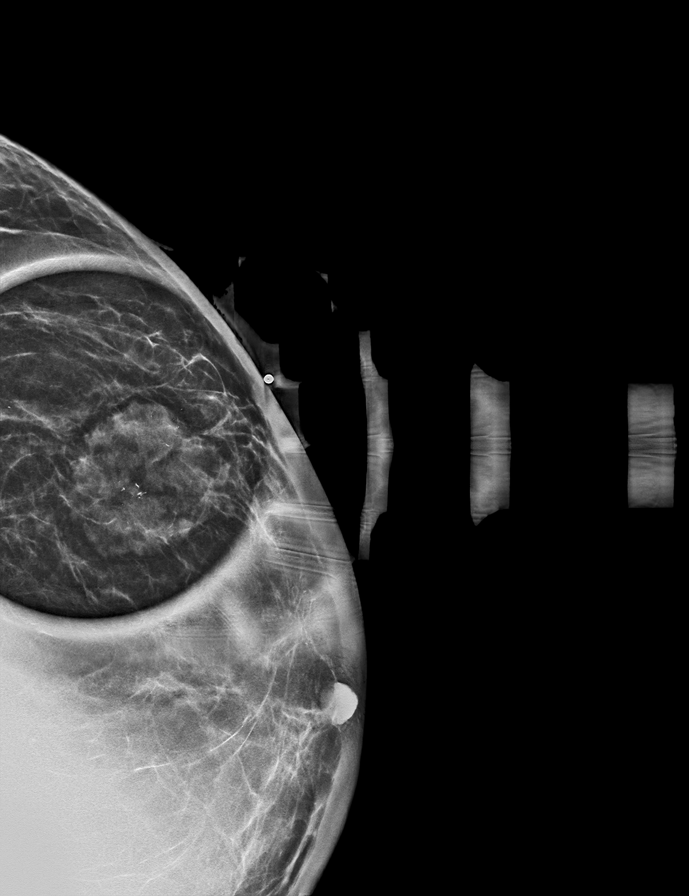

[R CC synth-2D]
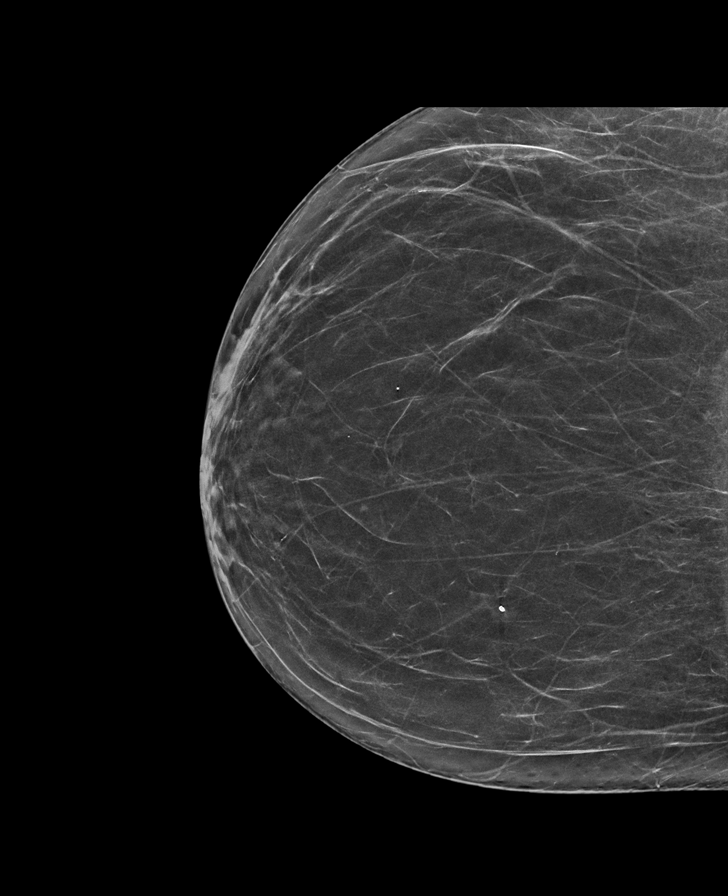

[R MLO synth-2D]
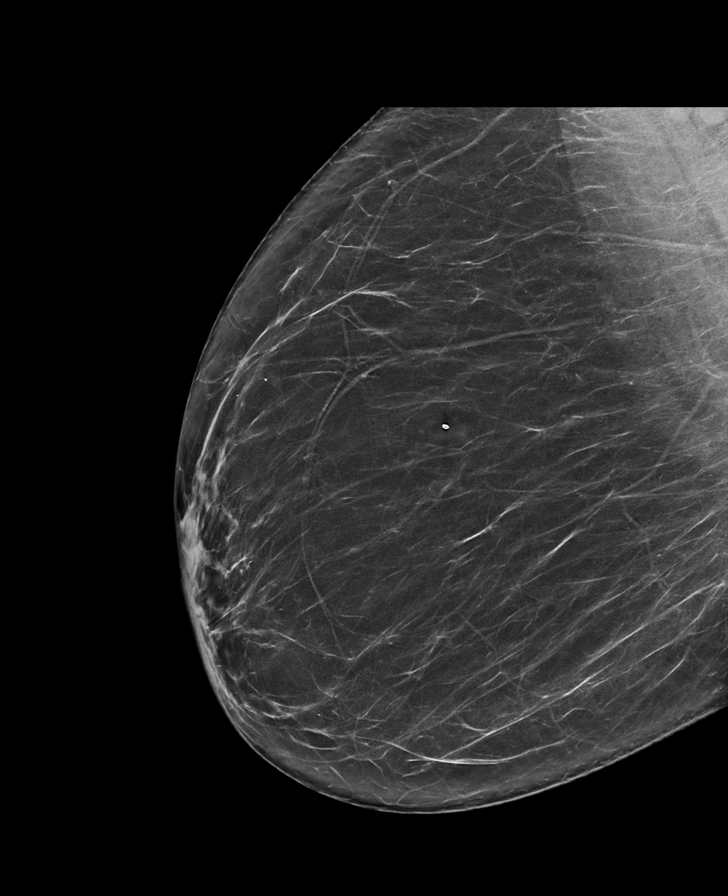

[L MLO synth-2D]
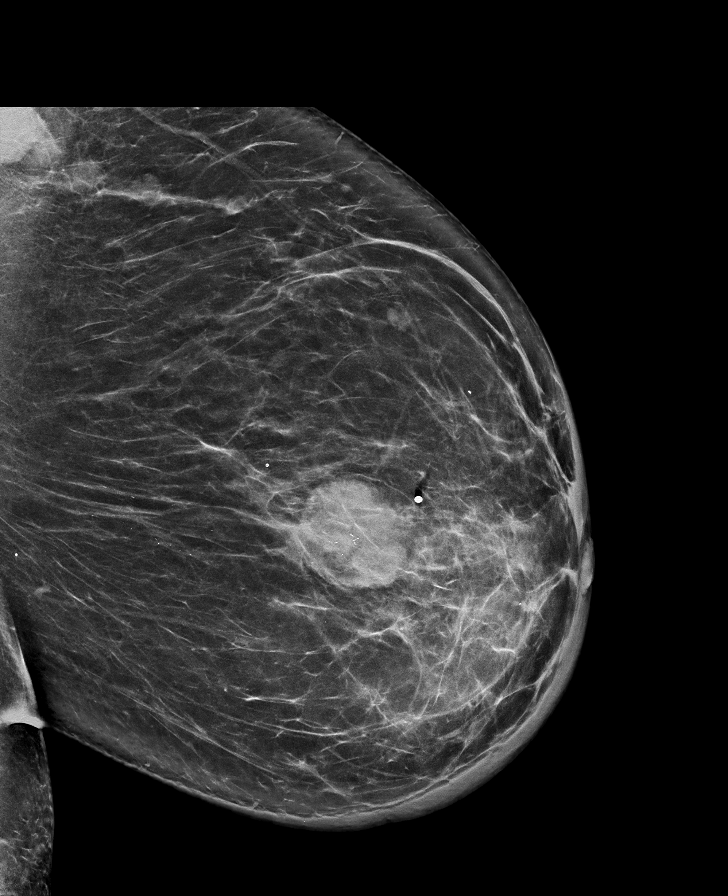

[8 of 33 positions shown; findings below may reference images not displayed]

ACR Breast Density Category b: There are scattered areas of
fibroglandular density.
FINDINGS: No suspicious masses, calcifications, or distortion identified in
the right breast.

There is skin thickening seen in the left breast anteriorly,
medially, and inferiorly. The patient is palpating a suspicious mass
measuring up to 3.4 cm, containing multiple calcifications.
Posterior to the mass are additional suspicious calcifications
spanning at least 4.4 cm. There are numerous other suspicious masses
in the upper outer left breast. There is an abnormal node in the
left axilla.

Mammographic images were processed with CAD.

On physical exam, a lump is felt in the lateral left breast.

Targeted ultrasound is performed, showing the patient's dominant
palpable mass in the left breast at [DATE], 8 cm from the nipple
measuring 2.8 x 2.8 by 3.6 cm. Numerous other masses are seen in the
upper outer left breast. There is a mass at 3 o'clock, 7 cm from the
nipple measuring 9 x 4 by 3 mm. There is a mass in the left breast
at 2 o'clock, 9 cm from the nipple measuring 7 x 5 x 7 mm. There is
a mass in the left breast at 2 o'clock, 8 cm from the nipple
measuring 4 x 2 by 5 mm. There is a mass in the left breast at 1
o'clock, 10 cm from the nipple measuring 3 x 3 by 2 mm. There is a
mass in the left breast at 1 o'clock, 11 cm from the nipple
measuring 3 x 3 x 4 mm. Greater than 4 abnormal left axillary lymph
nodes are identified.
IMPRESSION: Highly suspicious mass/masses in the left breast worrisome for
malignancy. No abnormalities on the right.

RECOMMENDATION:
Recommend biopsying the patient's palpable mass in the left breast
at [DATE], 8 cm from the nipple. Recommend biopsying the mass in the
left breast at 1 o'clock, 11 cm from the nipple to evaluate for
extent of disease. Recommend biopsying 1 of the abnormal enlarged
lymph nodes in the left axilla. If breast conservation is considered
after the biopsies, recommend biopsying the calcifications in the
left breast posterior to the palpable mass. These calcifications are
very suspicious.

I have discussed the findings and recommendations with the patient.
If applicable, a reminder letter will be sent to the patient
regarding the next appointment.

BI-RADS CATEGORY  5: Highly suggestive of malignancy.

## 2019-07-29 ENCOUNTER — Ambulatory Visit
Admission: RE | Admit: 2019-07-29 | Discharge: 2019-07-29 | Disposition: A | Discharge status: Home / Self Care | Payer: Medicare Other | Source: Ambulatory Visit | Attending: Internal Medicine | Admitting: Internal Medicine

## 2019-07-29 ENCOUNTER — Other Ambulatory Visit: Payer: Self-pay | Admitting: Internal Medicine

## 2019-07-29 ENCOUNTER — Other Ambulatory Visit: Payer: Self-pay

## 2019-07-29 DIAGNOSIS — C50412 Malignant neoplasm of upper-outer quadrant of left female breast: Secondary | ICD-10-CM | POA: Diagnosis not present

## 2019-07-29 DIAGNOSIS — C773 Secondary and unspecified malignant neoplasm of axilla and upper limb lymph nodes: Secondary | ICD-10-CM | POA: Diagnosis not present

## 2019-07-29 DIAGNOSIS — C50512 Malignant neoplasm of lower-outer quadrant of left female breast: Secondary | ICD-10-CM | POA: Diagnosis not present

## 2019-07-29 DIAGNOSIS — N6321 Unspecified lump in the left breast, upper outer quadrant: Secondary | ICD-10-CM | POA: Diagnosis not present

## 2019-07-29 DIAGNOSIS — N644 Mastodynia: Secondary | ICD-10-CM

## 2019-07-29 DIAGNOSIS — N63 Unspecified lump in unspecified breast: Secondary | ICD-10-CM

## 2019-07-29 DIAGNOSIS — R599 Enlarged lymph nodes, unspecified: Secondary | ICD-10-CM

## 2019-07-29 DIAGNOSIS — R590 Localized enlarged lymph nodes: Secondary | ICD-10-CM | POA: Diagnosis not present

## 2019-07-29 DIAGNOSIS — N6323 Unspecified lump in the left breast, lower outer quadrant: Secondary | ICD-10-CM | POA: Diagnosis not present

## 2019-07-29 IMAGING — MG MM BREAST LOCALIZATION CLIP
4 series · 4 of 12 positions shown · non-contrast
Comparison: Previous exam(s).

CLINICAL DATA: Patient presents for biopsy of two masses in the
left breast as well as biopsy of a left axillary lymph node.

EXAM:
DIAGNOSTIC LEFT MAMMOGRAM POST ULTRASOUND BIOPSY

[L CC synth-2D]
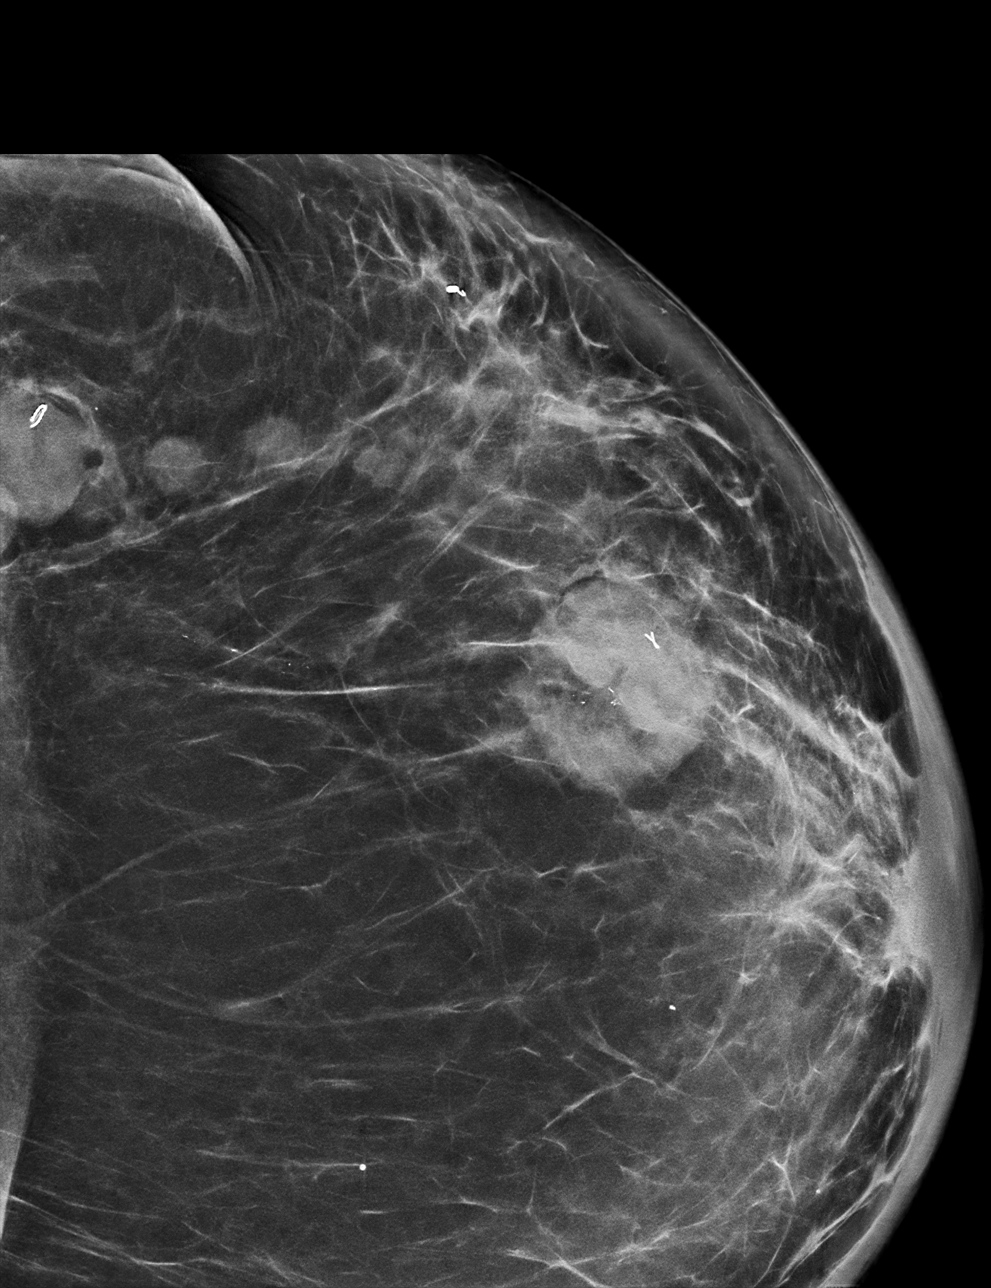

[L ML synth-2D]
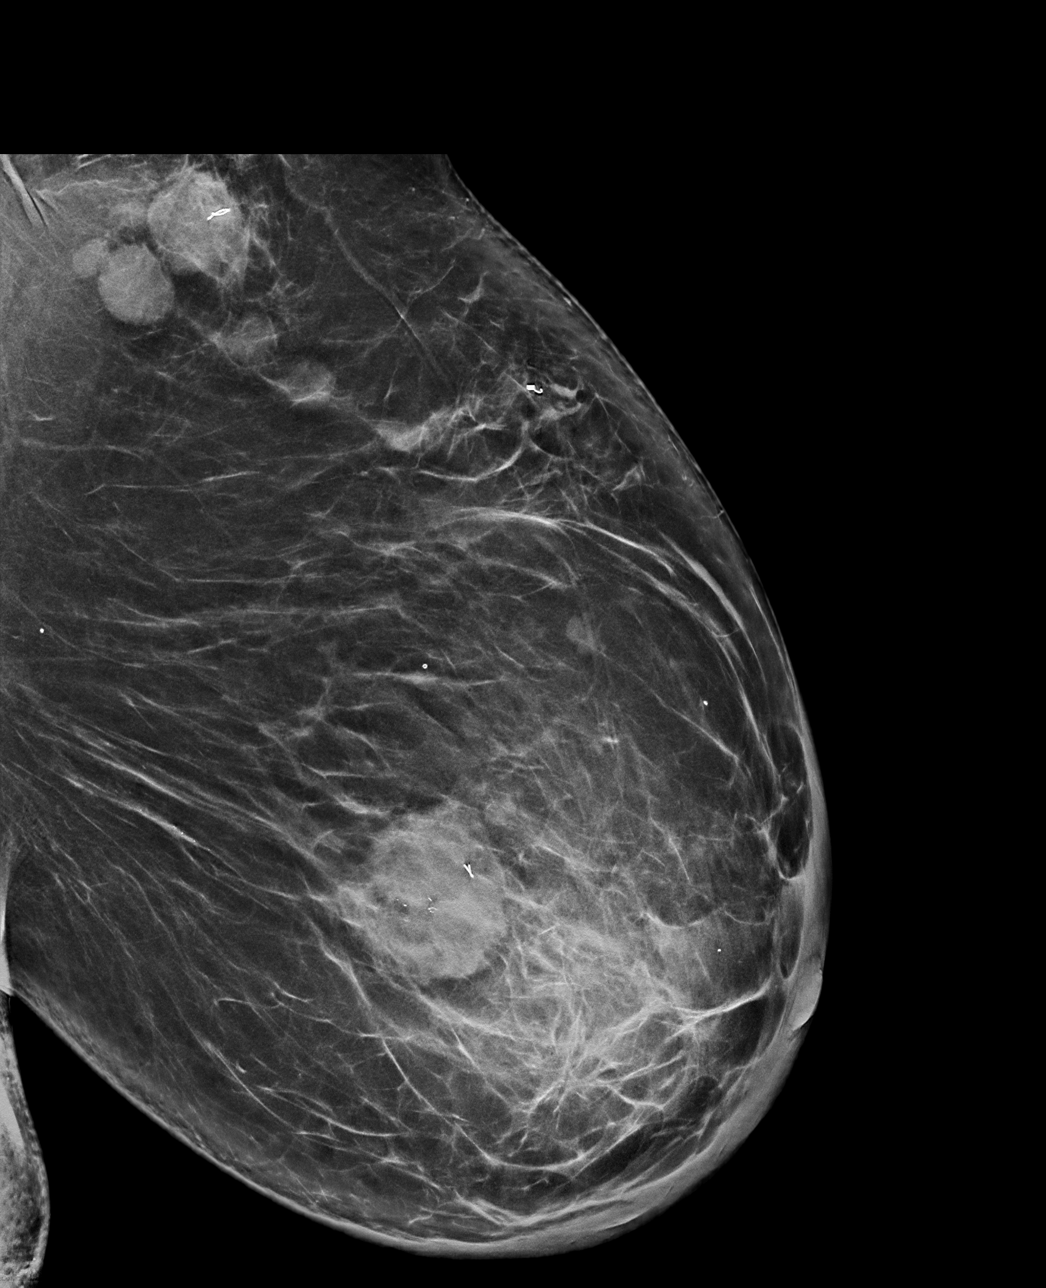

[L ML tomo · tomo slice 48/95.0]
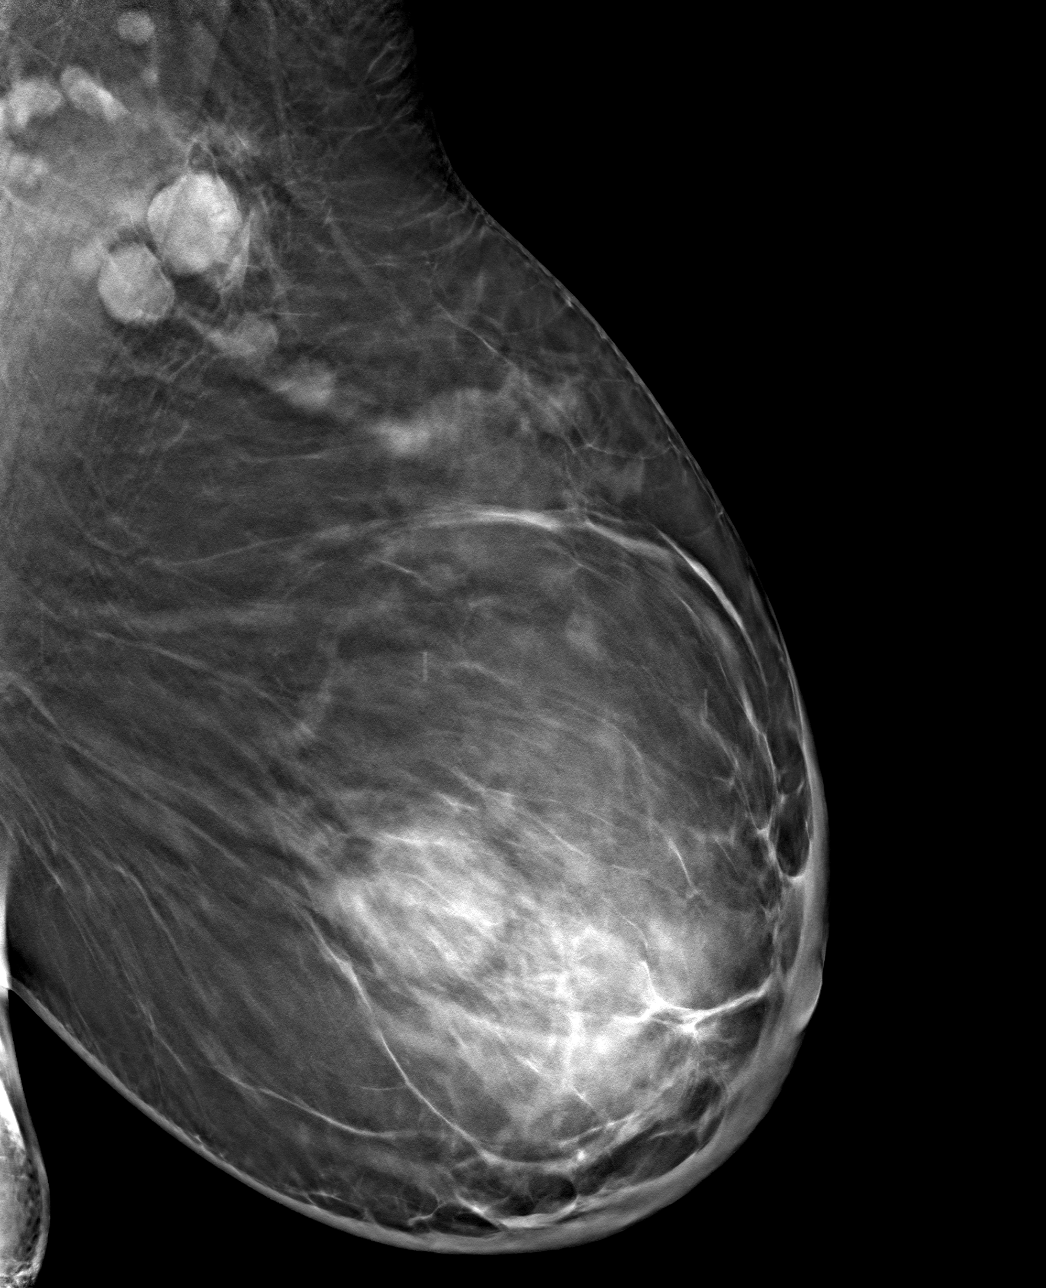

[L CC tomo · tomo slice 45/89.0]
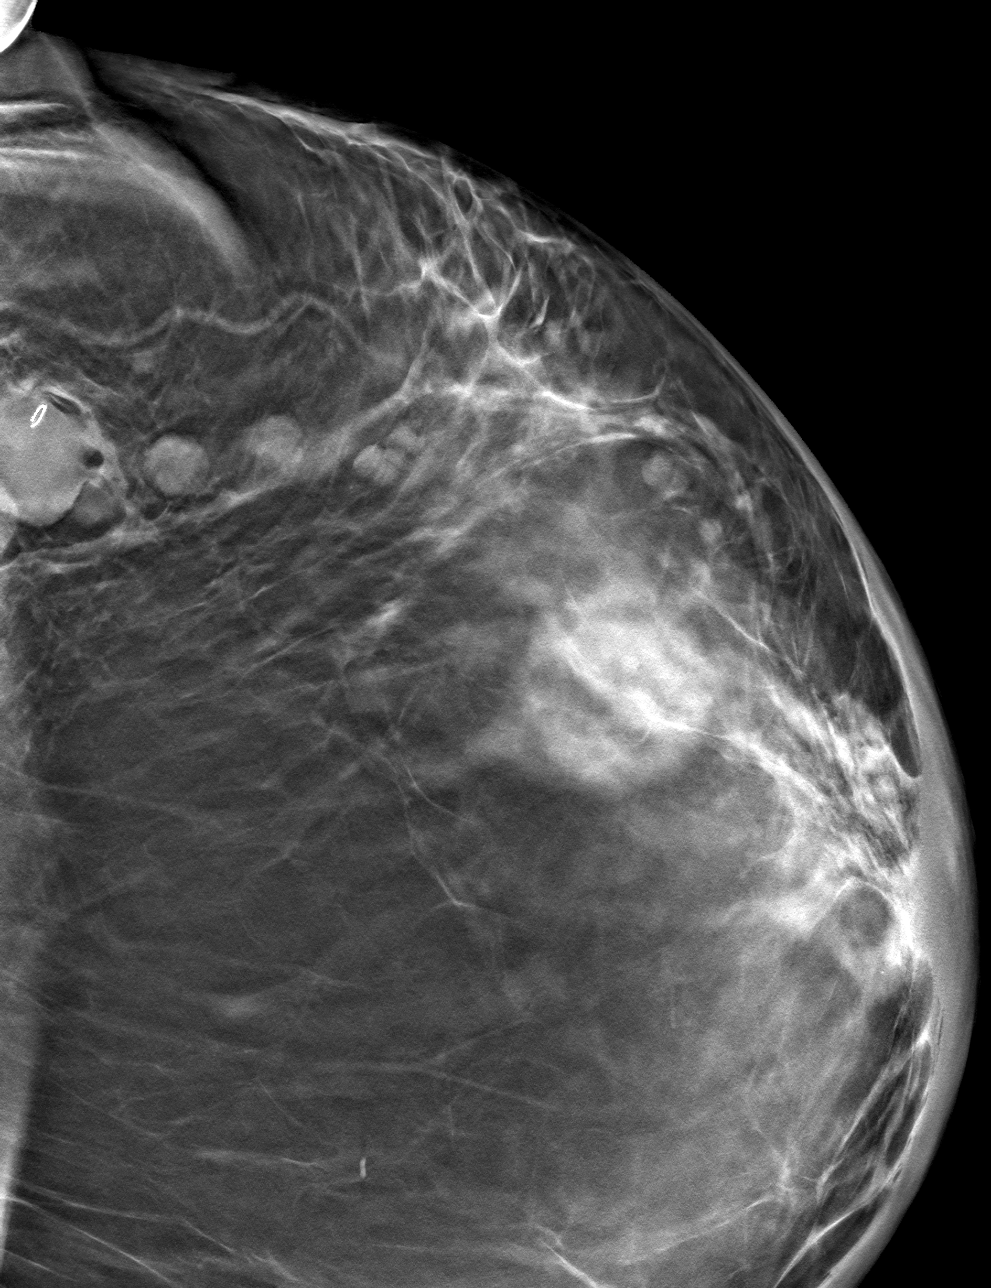

[4 of 12 positions shown; findings below may reference images not displayed]

FINDINGS: Mammographic images were obtained following ultrasound guided biopsy
of a left breast mass at 3:30 o'clock, a left breast mass at 1
o'clock and a left axillary lymph node. The ribbon biopsy marking
clip is in expected position at the site of biopsy at 3:30 o'clock.
The coil biopsy marking clip is in the expected location of the site
of biopsy at 1 o'clock. The HydroMARK biopsy marking clip is in the
expected location at the site of biopsy in the left axilla.
IMPRESSION: Appropriate positioning of the ribbon clip at 3:30 o'clock in the
left breast, the coil clip at 1 o'clock in the left breast, and the
HydroMARK clip in the left axilla.

Final Assessment: Post Procedure Mammograms for Marker Placement

## 2019-07-29 IMAGING — US US BREAST BX W LOC DEV 1ST LESION IMG BX SPEC US GUIDE*L*
1 series · 16 of 25 positions shown · non-contrast
Comparison: Previous exam(s).
COMPARISON: Previous exam(s).

Addendum:
CLINICAL DATA: Patient presents for biopsy of 2 left breast masses
as well as a left axillary lymph node.

EXAM:
ULTRASOUND GUIDED LEFT BREAST CORE NEEDLE BIOPSY x 2
US AXILLARY NODE CORE BIOPSY LEFT

[Series 1: us breast bx w loc dev 1st lesion img bx spec us g · 0.05mm/px · 16 of 28 slices shown]
[im 1/28]
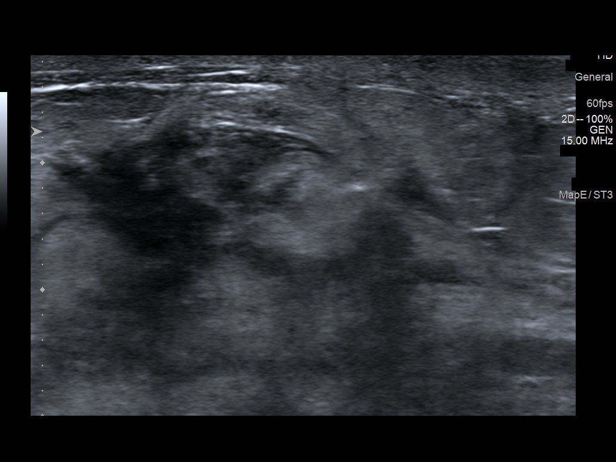
[im 3/28]
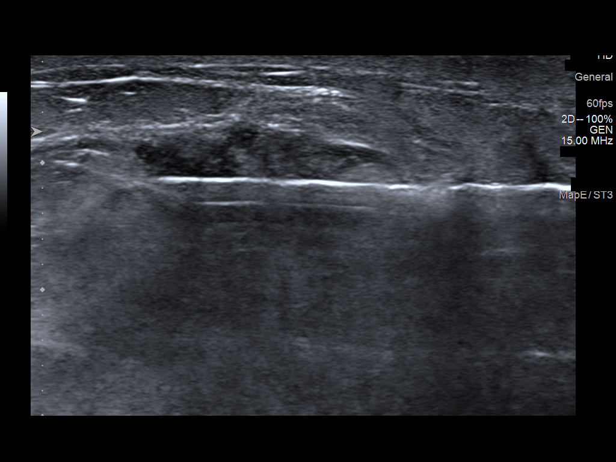
[im 4/28]
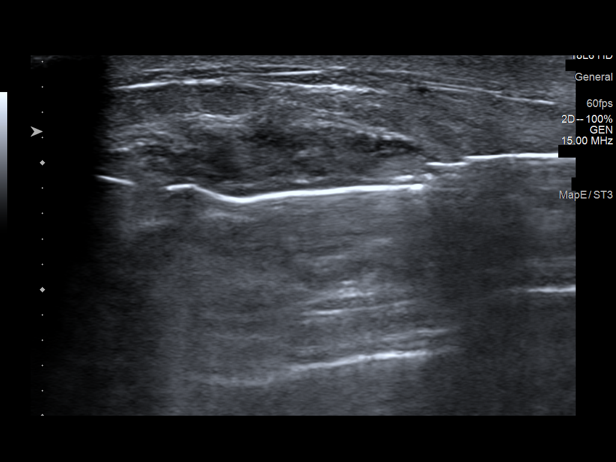
[im 6/28]
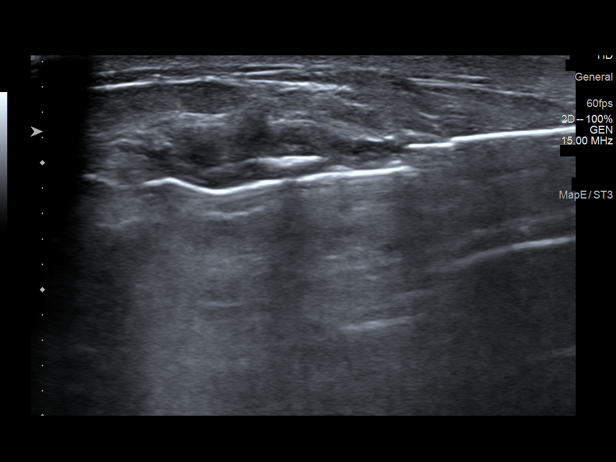
[im 8/28]
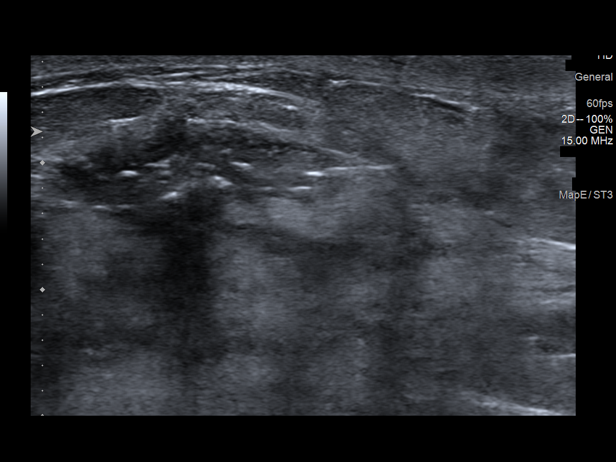
[im 10/28]
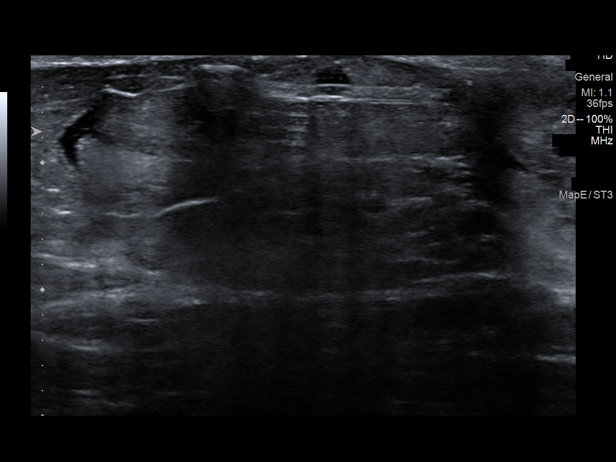
[im 12/28]
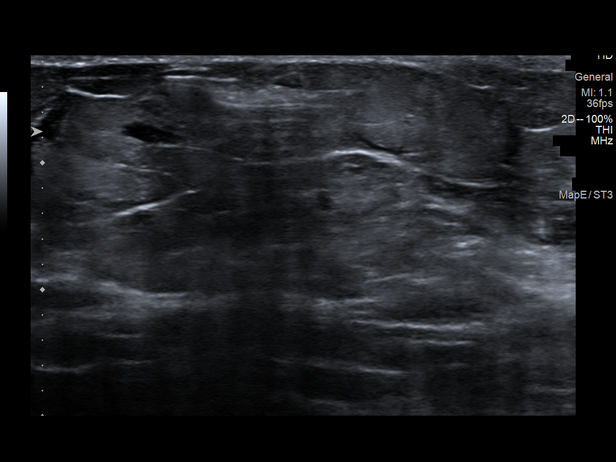
[im 13/28]
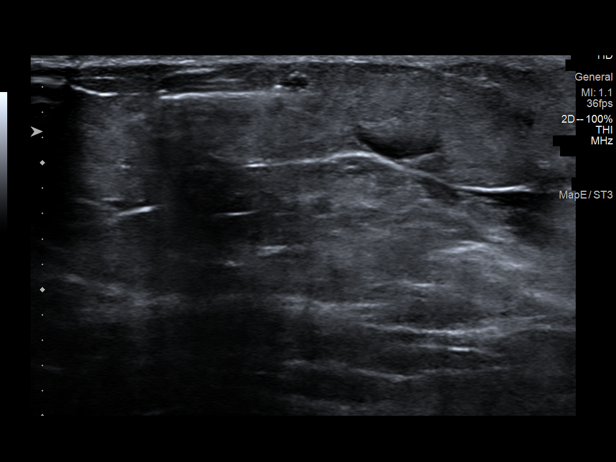
[im 15/28]
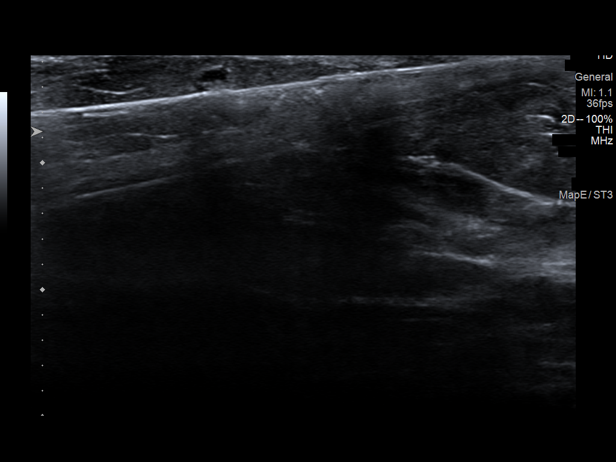
[im 16/28]
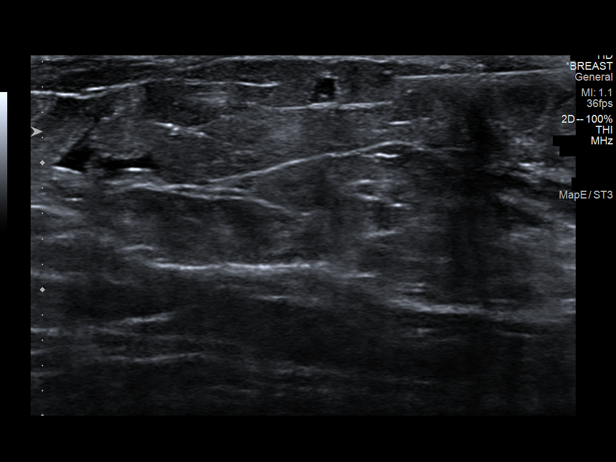
[im 19/28]
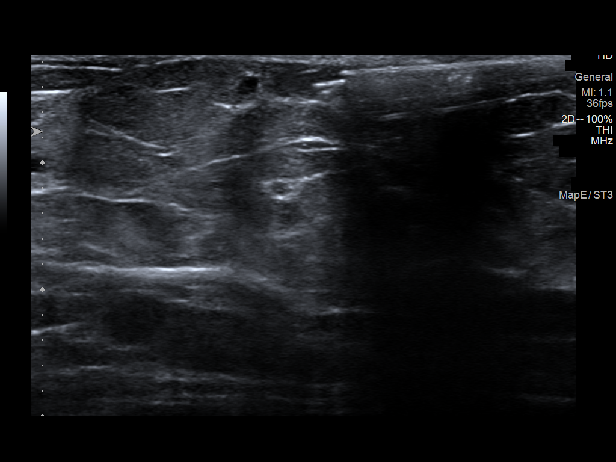
[im 20/28]
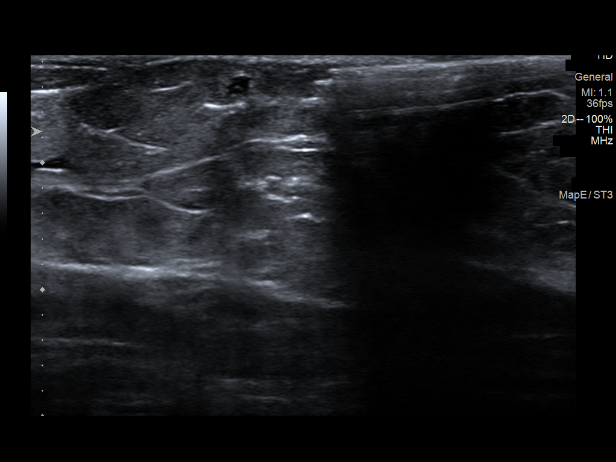
[im 22/28]
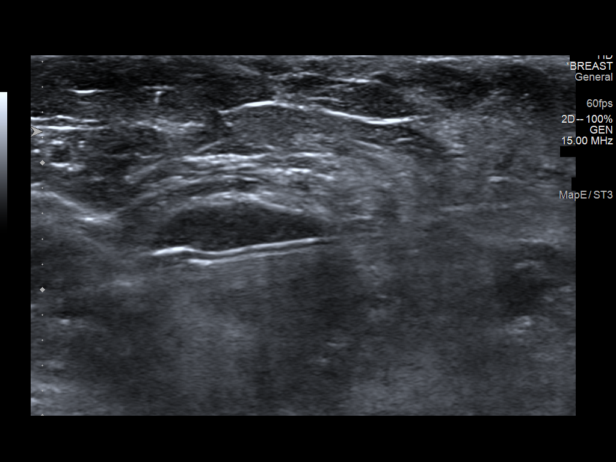
[im 24/28]
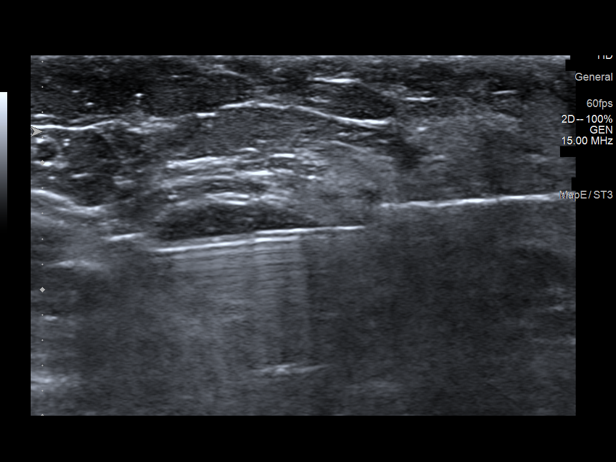
[im 25/28]
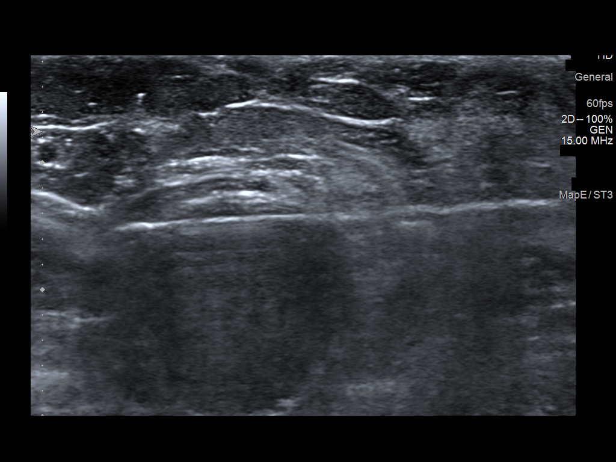
[im 28/28]
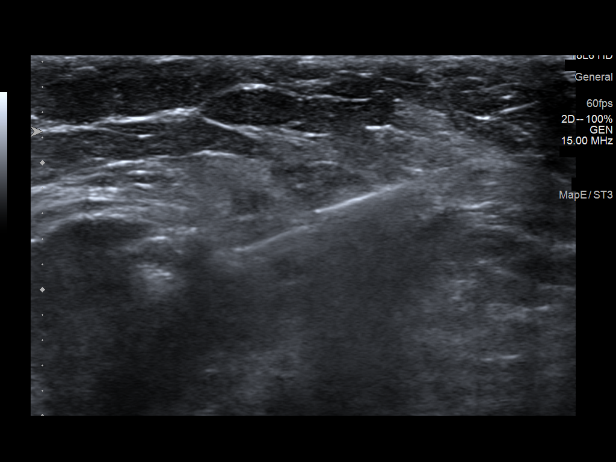

[16 of 25 positions shown; findings below may reference images not displayed]



1.  Lesion quadrant: Lower outer quadrant

Using sterile technique and 1% Lidocaine as local anesthetic, under
direct ultrasound visualization, a 12 gauge NIEDDU device was
used to perform biopsy of a left breast mass at 3:30 o'clock using a
lateral approach. At the conclusion of the procedure ribbon tissue
marker clip was deployed into the biopsy cavity. Follow up 2 view
mammogram was performed and dictated separately. There was a
moderate-sized post biopsy hematoma.

2.  Lesion quadrant: Upper outer quadrant

Using sterile technique and 1% Lidocaine as local anesthetic, under
direct ultrasound visualization, a 12 gauge NIEDDU device was
used to perform biopsy of a left breast mass at 1 o'clock using a
lateral approach. At the conclusion of the procedure coil tissue
marker clip was deployed into the biopsy cavity. Follow up 2 view
mammogram was performed and dictated separately.

3.  Lesion quadrant: Axilla

Using sterile technique and 1% Lidocaine as local anesthetic, under
direct ultrasound visualization, a 14 gauge NIEDDU device was
used to perform biopsy of a left axillary lymph node using a lateral
approach. At the conclusion of the procedure HydroMARK tissue marker
clip was deployed into the biopsy cavity. Follow up 2 view mammogram
was performed and dictated separately.
IMPRESSION: Ultrasound guided biopsy of a left breast mass at 3:30 o'clock, a
left breast mass at 1 o'clock, and a left axillary lymph node.
Moderate size post biopsy hematoma at the 3:30 o'clock position.

ADDENDUM:
Pathology revealed GRADE III INVASIVE DUCTAL CARCINOMA of the Left
breast, both locations, 3:30 o'clock and 1 o'clock. This was found
to be concordant by Dr. NIEDDU.

Pathology revealed METASTATIC BREAST CARCINOMA TO LYMPH NODE of the
Left axilla. This was found to be concordant by Dr. NIEDDU
NIEDDU.

Pathology results were discussed with the patient by telephone. The
patient reported doing well after the biopsies with tenderness and
minimal bleeding at the sites. Post biopsy instructions and care
were reviewed and questions were answered. The patient was
encouraged to call The [REDACTED] for any
additional concerns.

The patient was referred to [REDACTED]
[REDACTED] at [REDACTED] on
[DATE].

Pathology results reported by NIEDDU, RN on [DATE].



1.  Lesion quadrant: Lower outer quadrant

Using sterile technique and 1% Lidocaine as local anesthetic, under
direct ultrasound visualization, a 12 gauge NIEDDU device was
used to perform biopsy of a left breast mass at 3:30 o'clock using a
lateral approach. At the conclusion of the procedure ribbon tissue
marker clip was deployed into the biopsy cavity. Follow up 2 view
mammogram was performed and dictated separately. There was a
moderate-sized post biopsy hematoma.

2.  Lesion quadrant: Upper outer quadrant

Using sterile technique and 1% Lidocaine as local anesthetic, under
direct ultrasound visualization, a 12 gauge NIEDDU device was
used to perform biopsy of a left breast mass at 1 o'clock using a
lateral approach. At the conclusion of the procedure coil tissue
marker clip was deployed into the biopsy cavity. Follow up 2 view
mammogram was performed and dictated separately.

3.  Lesion quadrant: Axilla

Using sterile technique and 1% Lidocaine as local anesthetic, under
direct ultrasound visualization, a 14 gauge NIEDDU device was
used to perform biopsy of a left axillary lymph node using a lateral
approach. At the conclusion of the procedure HydroMARK tissue marker
clip was deployed into the biopsy cavity. Follow up 2 view mammogram
was performed and dictated separately.
IMPRESSION: Ultrasound guided biopsy of a left breast mass at 3:30 o'clock, a
left breast mass at 1 o'clock, and a left axillary lymph node.
Moderate size post biopsy hematoma at the 3:30 o'clock position.

## 2019-07-31 ENCOUNTER — Other Ambulatory Visit: Payer: Self-pay

## 2019-07-31 ENCOUNTER — Ambulatory Visit (INDEPENDENT_AMBULATORY_CARE_PROVIDER_SITE_OTHER): Payer: Medicare Other | Admitting: General Practice

## 2019-07-31 DIAGNOSIS — Z7901 Long term (current) use of anticoagulants: Secondary | ICD-10-CM

## 2019-07-31 LAB — POCT INR: INR: 2.7 (ref 2.0–3.0)

## 2019-07-31 NOTE — Patient Instructions (Signed)
Pre visit review using our clinic review tool, if applicable. No additional management support is needed unless otherwise documented below in the visit note.  Continue to take  1 (5 mg) tablet daily except take 1/2 tablet on Wednesdays.   Re-check in 5 weeks.

## 2019-08-01 ENCOUNTER — Encounter: Payer: Self-pay | Admitting: *Deleted

## 2019-08-01 DIAGNOSIS — C50812 Malignant neoplasm of overlapping sites of left female breast: Secondary | ICD-10-CM | POA: Insufficient documentation

## 2019-08-01 DIAGNOSIS — Z171 Estrogen receptor negative status [ER-]: Secondary | ICD-10-CM | POA: Insufficient documentation

## 2019-08-05 ENCOUNTER — Telehealth: Payer: Self-pay | Admitting: Hematology and Oncology

## 2019-08-05 NOTE — Telephone Encounter (Signed)
Called patient twice both times got voice mail did send email with information

## 2019-08-05 NOTE — Progress Notes (Signed)
[facility] CONSULT NOTE  Patient Care Team: Panosh, Standley Brooking, MD as PCP - General Stanford Breed, Denice Bors, MD (Cardiology) Susa Day, MD (Orthopedic Surgery) Mauro Kaufmann, RN as Oncology Nurse Navigator Rockwell Germany, RN as Oncology Nurse Navigator  CHIEF COMPLAINTS/PURPOSE OF CONSULTATION:  Newly diagnosed breast cancer  HISTORY OF PRESENTING ILLNESS:  [name] Foster 68 y.o. female is here because of recent diagnosis of invasive ductal carcinoma of the left breast. The cancer was detected on a diagnostic mammogram on 07/24/19 after the patient palpated a left breast lump and noted skin thickening. It showed a 3.6cm mass in the left breast at the 3:30 position, a 0.9cm mass at the 3:00 position, a 0.7cm mass at the 2:00 position, a 0.5cm mass at the 2:00 position, a 0.3cm mass at the 1:00 position, and a 0.4cm mass at the 1:00 position, with 4 abnormal left axillary lymph nodes. Biopsy on 07/29/19 showed invasive ductal carcinoma, grade 3, HER-2 positive (3+), ER/PR negative, Ki67 70%, in the breast and lymph nodes. She presents to the clinic today for initial evaluation and discussion of treatment options.   I reviewed her records extensively and collaborated the history with the patient.  SUMMARY OF ONCOLOGIC HISTORY: Oncology History  Malignant neoplasm of overlapping sites of left breast in female, estrogen receptor negative ([place])  08/01/2019 Initial Diagnosis   Patient palpated left breast lump and skin thickening. Mammogram showed a 3.6cm mass at 3:30 position, a 0.9cm mass at 3:00 position, a 0.7cm mass at 2:00 position, a 0.5cm mass at 2:00 position, a 0.3cm mass at 1:00 position, and a 0.4cm mass at 1:00 position, with 4 abnormal left axillary lymph nodes. Biopsy showed IDC, grade 3, HER-2 + (3+), ER/PR -, Ki67 70%, in the breast and lymph nodes.    08/06/2019 Cancer Staging   Staging form: Breast, AJCC 8th Edition - Clinical: Stage IIIB (cT4, cN1, cM0, G3, ER-,  PR-, HER2+) - Signed by Nicholas Lose, MD on 08/06/2019     MEDICAL HISTORY:  Past Medical History:  Diagnosis Date  . Allergic rhinitis   . Aortic valve disorders   . Asthma   . Hyperlipidemia   . Hypertension   . Long term (current) use of anticoagulants   . Other primary cardiomyopathies   . Personal history of colonic polyps   . Personal history of venous thrombosis and embolism     SURGICAL HISTORY: Past Surgical History:  Procedure Laterality Date  . ABDOMINAL HYSTERECTOMY    . CARDIAC CATHETERIZATION    . DILATION AND CURETTAGE OF UTERUS     several  . Rt knee arthoscopic    . TEE WITHOUT CARDIOVERSION N/A 02/21/2013   Procedure: TRANSESOPHAGEAL ECHOCARDIOGRAM (TEE);  Surgeon: Lelon Perla, MD;  Location: [facility] ENDOSCOPY;  Service: Cardiovascular;  Laterality: N/A;    SOCIAL HISTORY: Social History   Socioeconomic History  . Marital status: Legally Separated    Spouse name: Not on file  . Number of children: Not on file  . Years of education: Not on file  . Highest education level: Not on file  Occupational History  . Not on file  Social Needs  . Financial resource strain: Not on file  . Food insecurity    Worry: Not on file    Inability: Not on file  . Transportation needs    Medical: Not on file    Non-medical: Not on file  Tobacco Use  . Smoking status: Former Smoker    Packs/day: 0.25  Quit date: 10/21/2013    Years since quitting: 5.7  . Smokeless tobacco: Never Used  Substance and Sexual Activity  . Alcohol use: Yes    Comment: rare occasion  . Drug use: No  . Sexual activity: Not on file  Lifestyle  . Physical activity    Days per week: Not on file    Minutes per session: Not on file  . Stress: Not on file  Relationships  . Social Herbalist on phone: Not on file    Gets together: Not on file    Attends religious service: Not on file    Active member of club or organization: Not on file    Attends meetings of clubs or  organizations: Not on file    Relationship status: Not on file  . Intimate partner violence    Fear of current or ex partner: Not on file    Emotionally abused: Not on file    Physically abused: Not on file    Forced sexual activity: Not on file  Other Topics Concern  . 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    Divorced   Regular exercise- no   5 hours sleep    Lives with a son age 60    No pets          FAMILY HISTORY: Family History  Problem Relation Age of Onset  . Alcohol abuse Other   . Colon cancer Other   . Depression Other   . Hyperlipidemia Other   . Hypertension Other   . Kidney disease Other   . Uterine cancer Other   . Breast cancer Other        cousin  . Stroke Other        grandmother    ALLERGIES:  is allergic to aspirin; penicillins; and tetanus toxoid.  MEDICATIONS:  Current Outpatient Medications  Medication Sig Dispense Refill  . Albuterol Sulfate (PROAIR RESPICLICK) 409 (90 Base) MCG/ACT AEPB Inhale 2 puffs into the lungs every 6 (six) hours as needed. 2 each 1  . amLODipine (NORVASC) 10 MG tablet TAKE 1 TABLET BY MOUTH  DAILY 90 tablet 1  . atorvastatin (LIPITOR) 20 MG tablet TAKE 1 TABLET BY MOUTH  DAILY 90 tablet 1  . cholecalciferol (VITAMIN D) 1000 UNITS tablet Take 1,000 Units by mouth daily.      . fexofenadine (ALLEGRA) 180 MG tablet Take 180 mg by mouth daily.    . fluticasone (FLONASE) 50 MCG/ACT nasal spray USE 2 SPRAYS IN EACH NOSTRIL DAILY. PT NEEDS TO SCHEDULE A FOLLOW UP APPT BEFORE NEXT REFILL. 16 g 0  . furosemide (LASIX) 40 MG tablet TAKE 1 TABLET BY MOUTH  DAILY 90 tablet 1  . hydrALAZINE (APRESOLINE) 50 MG tablet Take 1 tablet (50 mg total) by mouth 3 (three) times daily. 270 tablet 3  . metoprolol tartrate (LOPRESSOR) 50 MG tablet TAKE 1 TABLET BY MOUTH  TWICE DAILY 180 tablet 1  . potassium chloride SA (K-DUR) 20 MEQ tablet TAKE 1 TABLET BY MOUTH  DAILY 90 tablet 1  . ramipril  (ALTACE) 10 MG capsule TAKE 1 CAPSULE BY MOUTH  DAILY 90 capsule 1  . warfarin (COUMADIN) 5 MG tablet Take 1 tablet daily or as directed by anticoagulation clinic. 100 tablet 1   No current facility-administered medications for this visit.     REVIEW OF SYSTEMS:   Constitutional: Denies fevers, chills or abnormal night  sweats Eyes: Denies blurriness of vision, double vision or watery eyes Ears, nose, mouth, throat, and face: Denies mucositis or sore throat Respiratory: Denies cough, dyspnea or wheezes Cardiovascular: Denies palpitation, chest discomfort or lower extremity swelling Gastrointestinal:  Denies nausea, heartburn or change in bowel habits Skin: Denies abnormal skin rashes Lymphatics: Denies new lymphadenopathy or easy bruising Neurological:Denies numbness, tingling or new weaknesses Behavioral/Psych: Mood is stable, no new changes  Breast: Palpable left breast mass and skin thickening  All other systems were reviewed with the patient and are negative.  PHYSICAL EXAMINATION: ECOG PERFORMANCE STATUS: 1 - Symptomatic but completely ambulatory  Vitals:   08/06/19 0904  BP: 139/66  Pulse: 60  Resp: 17  Temp: 98 F (36.7 C)  SpO2: 100%   Filed Weights   08/06/19 0904  Weight: 208 lb 12.8 oz (94.7 kg)    GENERAL:alert, no distress and comfortable SKIN: skin color, texture, turgor are normal, no rashes or significant lesions EYES: normal, conjunctiva are pink and non-injected, sclera clear OROPHARYNX:no exudate, no erythema and lips, buccal mucosa, and tongue normal  NECK: supple, thyroid normal size, non-tender, without nodularity LYMPH:  no palpable lymphadenopathy in the cervical, axillary or inguinal LUNGS: clear to auscultation and percussion with normal breathing effort HEART: regular rate & rhythm and no murmurs and no lower extremity edema ABDOMEN:abdomen soft, non-tender and normal bowel sounds Musculoskeletal:no cyanosis of digits and no clubbing  PSYCH:  alert & oriented x 3 with fluent speech NEURO: no focal motor/sensory deficits BREAST: Large palpable mass in the left breast lower outer quadrant with skin puckering. No palpable axillary or supraclavicular lymphadenopathy (exam performed in the presence of a chaperone)   LABORATORY DATA:  I have reviewed the data as listed Lab Results  Component Value Date   WBC 8.2 08/06/2019   HGB 12.9 08/06/2019   HCT 41.2 08/06/2019   MCV 89.2 08/06/2019   PLT 226 08/06/2019   Lab Results  Component Value Date   NA 144 08/06/2019   K 3.9 08/06/2019   CL 103 08/06/2019   CO2 30 08/06/2019    RADIOGRAPHIC STUDIES: I have personally reviewed the radiological reports and agreed with the findings in the report.  ASSESSMENT AND PLAN:  Malignant neoplasm of overlapping sites of left breast in female, estrogen receptor negative ([place]) 08/01/2019:Patient palpated left breast lump and skin thickening. Mammogram showed a 3.6cm mass at 3:30 position, a 0.9cm mass at 3:00 position, a 0.7cm mass at 2:00 position, a 0.5cm mass at 2:00 position, a 0.3cm mass at 1:00 position, and a 0.4cm mass at 1:00 position, with 4 abnormal left axillary lymph nodes. Biopsy showed IDC, grade 3, HER-2 + (3+), ER/PR -, Ki67 70%, in the breast and lymph nodes.  Pathology and radiology counseling: Discussed with the patient, the details of pathology including the type of breast cancer,the clinical staging, the significance of ER, PR and HER-2/neu receptors and the implications for treatment. After reviewing the pathology in detail, we proceeded to discuss the different treatment options between surgery, radiation, chemotherapy, antiestrogen therapies.  Recommendation based on multidisciplinary tumor board: 1. Neoadjuvant chemotherapy with TCH Perjeta 6 cycles followed by Herceptin Perjeta maintenance versus Kadcyla maintenance (depending on response to chemo) for 1 year 2. Followed by mastectomy with axillary lymph node  dissection 3. Followed by adjuvant radiation therapy   Chemotherapy Counseling: I discussed the risks and benefits of chemotherapy including the risks of nausea/ vomiting, risk of infection from low WBC count, fatigue due to chemo or anemia,  bruising or bleeding due to low platelets, mouth sores, loss/ change in taste and decreased appetite. Liver and kidney function will be monitored through out chemotherapy as abnormalities in liver and kidney function may be a side effect of treatment. Cardiac dysfunction due to Herceptin and Perjeta were discussed in detail.  Neuropathy risk from Taxol and risk of permanent bone marrow dysfunction due to chemo were also discussed.  Plan: 1. Port placement 2. Echocardiogram 3. Chemotherapy class 4. Breast MRI 5. CT chest abdomen pelvis and bone scan for staging 6.  Genetic testing because she has 2 family members with breast cancer  Return to clinic in 2 weeks to start chemotherapy.     All questions were answered. The patient knows to call the clinic with any problems, questions or concerns.   Rulon Eisenmenger, MD, MPH 08/06/2019    I, Molly Dorshimer, am acting as scribe for Nicholas Lose, MD.  I have reviewed the above documentation for accuracy and completeness, and I agree with the above.

## 2019-08-06 ENCOUNTER — Encounter: Payer: Self-pay | Admitting: *Deleted

## 2019-08-06 ENCOUNTER — Encounter: Payer: Self-pay | Admitting: Medical Oncology

## 2019-08-06 ENCOUNTER — Ambulatory Visit
Admission: RE | Admit: 2019-08-06 | Discharge: 2019-08-06 | Disposition: A | Discharge status: Home / Self Care | Payer: Medicare Other | Source: Ambulatory Visit | Attending: Radiation Oncology | Admitting: Radiation Oncology

## 2019-08-06 ENCOUNTER — Inpatient Hospital Stay: Payer: Medicare Other | Attending: Hematology and Oncology | Admitting: Hematology and Oncology

## 2019-08-06 ENCOUNTER — Inpatient Hospital Stay: Payer: Medicare Other

## 2019-08-06 ENCOUNTER — Encounter: Payer: Self-pay | Admitting: Licensed Clinical Social Worker

## 2019-08-06 ENCOUNTER — Other Ambulatory Visit: Payer: Self-pay | Admitting: *Deleted

## 2019-08-06 ENCOUNTER — Ambulatory Visit: Payer: Medicare Other | Attending: General Surgery | Admitting: Physical Therapy

## 2019-08-06 ENCOUNTER — Other Ambulatory Visit: Payer: Self-pay | Admitting: General Surgery

## 2019-08-06 ENCOUNTER — Ambulatory Visit (HOSPITAL_BASED_OUTPATIENT_CLINIC_OR_DEPARTMENT_OTHER): Payer: Medicare Other | Admitting: Licensed Clinical Social Worker

## 2019-08-06 ENCOUNTER — Other Ambulatory Visit: Payer: Self-pay

## 2019-08-06 ENCOUNTER — Encounter: Payer: Self-pay | Admitting: Physical Therapy

## 2019-08-06 VITALS — BP 139/66 | HR 60 | Temp 98.0°F | Resp 17 | Ht 66.0 in | Wt 208.8 lb

## 2019-08-06 DIAGNOSIS — R630 Anorexia: Secondary | ICD-10-CM | POA: Diagnosis not present

## 2019-08-06 DIAGNOSIS — R197 Diarrhea, unspecified: Secondary | ICD-10-CM | POA: Insufficient documentation

## 2019-08-06 DIAGNOSIS — Z79899 Other long term (current) drug therapy: Secondary | ICD-10-CM | POA: Diagnosis not present

## 2019-08-06 DIAGNOSIS — R293 Abnormal posture: Secondary | ICD-10-CM

## 2019-08-06 DIAGNOSIS — N289 Disorder of kidney and ureter, unspecified: Secondary | ICD-10-CM | POA: Diagnosis not present

## 2019-08-06 DIAGNOSIS — Z9012 Acquired absence of left breast and nipple: Secondary | ICD-10-CM | POA: Insufficient documentation

## 2019-08-06 DIAGNOSIS — C773 Secondary and unspecified malignant neoplasm of axilla and upper limb lymph nodes: Secondary | ICD-10-CM | POA: Insufficient documentation

## 2019-08-06 DIAGNOSIS — Z171 Estrogen receptor negative status [ER-]: Secondary | ICD-10-CM

## 2019-08-06 DIAGNOSIS — N2889 Other specified disorders of kidney and ureter: Secondary | ICD-10-CM | POA: Diagnosis not present

## 2019-08-06 DIAGNOSIS — R439 Unspecified disturbances of smell and taste: Secondary | ICD-10-CM | POA: Diagnosis not present

## 2019-08-06 DIAGNOSIS — Z87891 Personal history of nicotine dependence: Secondary | ICD-10-CM | POA: Diagnosis not present

## 2019-08-06 DIAGNOSIS — C50812 Malignant neoplasm of overlapping sites of left female breast: Secondary | ICD-10-CM

## 2019-08-06 DIAGNOSIS — Z8049 Family history of malignant neoplasm of other genital organs: Secondary | ICD-10-CM

## 2019-08-06 DIAGNOSIS — Z5111 Encounter for antineoplastic chemotherapy: Secondary | ICD-10-CM | POA: Insufficient documentation

## 2019-08-06 DIAGNOSIS — Z800 Family history of malignant neoplasm of digestive organs: Secondary | ICD-10-CM

## 2019-08-06 DIAGNOSIS — Z803 Family history of malignant neoplasm of breast: Secondary | ICD-10-CM | POA: Diagnosis not present

## 2019-08-06 DIAGNOSIS — Z809 Family history of malignant neoplasm, unspecified: Secondary | ICD-10-CM | POA: Diagnosis not present

## 2019-08-06 DIAGNOSIS — Z5189 Encounter for other specified aftercare: Secondary | ICD-10-CM | POA: Insufficient documentation

## 2019-08-06 DIAGNOSIS — M25612 Stiffness of left shoulder, not elsewhere classified: Secondary | ICD-10-CM | POA: Diagnosis not present

## 2019-08-06 DIAGNOSIS — C50912 Malignant neoplasm of unspecified site of left female breast: Secondary | ICD-10-CM | POA: Diagnosis not present

## 2019-08-06 LAB — CBC WITH DIFFERENTIAL (CANCER CENTER ONLY)
Abs Immature Granulocytes: 0.02 10*3/uL (ref 0.00–0.07)
Basophils Absolute: 0 10*3/uL (ref 0.0–0.1)
Basophils Relative: 0 %
Eosinophils Absolute: 0.1 10*3/uL (ref 0.0–0.5)
Eosinophils Relative: 1 %
HCT: 41.2 % (ref 36.0–46.0)
Hemoglobin: 12.9 g/dL (ref 12.0–15.0)
Immature Granulocytes: 0 %
Lymphocytes Relative: 40 %
Lymphs Abs: 3.3 10*3/uL (ref 0.7–4.0)
MCH: 27.9 pg (ref 26.0–34.0)
MCHC: 31.3 g/dL (ref 30.0–36.0)
MCV: 89.2 fL (ref 80.0–100.0)
Monocytes Absolute: 0.5 10*3/uL (ref 0.1–1.0)
Monocytes Relative: 6 %
Neutro Abs: 4.3 10*3/uL (ref 1.7–7.7)
Neutrophils Relative %: 53 %
Platelet Count: 226 10*3/uL (ref 150–400)
RBC: 4.62 MIL/uL (ref 3.87–5.11)
RDW: 12.8 % (ref 11.5–15.5)
WBC Count: 8.2 10*3/uL (ref 4.0–10.5)
nRBC: 0 % (ref 0.0–0.2)

## 2019-08-06 LAB — CMP (CANCER CENTER ONLY)
ALT: 18 U/L (ref 0–44)
AST: 19 U/L (ref 15–41)
Albumin: 4 g/dL (ref 3.5–5.0)
Alkaline Phosphatase: 117 U/L (ref 38–126)
Anion gap: 11 (ref 5–15)
BUN: 19 mg/dL (ref 8–23)
CO2: 30 mmol/L (ref 22–32)
Calcium: 9.3 mg/dL (ref 8.9–10.3)
Chloride: 103 mmol/L (ref 98–111)
Creatinine: 1.75 mg/dL — ABNORMAL HIGH (ref 0.44–1.00)
GFR, Est AFR Am: 34 mL/min — ABNORMAL LOW (ref >60)
GFR, Estimated: 30 mL/min — ABNORMAL LOW (ref >60)
Glucose, Bld: 95 mg/dL (ref 70–99)
Potassium: 3.9 mmol/L (ref 3.5–5.1)
Sodium: 144 mmol/L (ref 135–145)
Total Bilirubin: 0.4 mg/dL (ref 0.3–1.2)
Total Protein: 7.6 g/dL (ref 6.5–8.1)

## 2019-08-06 MED ORDER — LORAZEPAM 0.5 MG PO TABS: 0.5000 mg | ORAL_TABLET | Freq: Every evening | ORAL | 0 refills | Status: DC | PRN

## 2019-08-06 MED ORDER — PROCHLORPERAZINE MALEATE 10 MG PO TABS: 10.0000 mg | ORAL_TABLET | Freq: Four times a day (QID) | ORAL | 1 refills | Status: DC | PRN

## 2019-08-06 MED ORDER — LIDOCAINE-PRILOCAINE 2.5-2.5 % EX CREA: TOPICAL_CREAM | CUTANEOUS | 3 refills | Status: DC

## 2019-08-06 MED ORDER — ONDANSETRON HCL 8 MG PO TABS: 8.0000 mg | ORAL_TABLET | Freq: Two times a day (BID) | ORAL | 1 refills | Status: DC | PRN

## 2019-08-06 MED ORDER — DEXAMETHASONE 4 MG PO TABS: 4.0000 mg | ORAL_TABLET | Freq: Two times a day (BID) | ORAL | 0 refills | Status: DC

## 2019-08-06 NOTE — Patient Instructions (Signed)

## 2019-08-06 NOTE — Progress Notes (Signed)
Radiation Oncology         [phone] ________________________________  Name: [name] Foster        MRN: [id number]  Date of Service: 08/06/2019 DOB: 09/30/1951  DD:UKGURK, Standley Brooking, MD  Stark Klein, MD     REFERRING PHYSICIAN: Stark Klein, MD   DIAGNOSIS: The encounter diagnosis was Malignant neoplasm of overlapping sites of left breast in female, estrogen receptor negative ([place]).   HISTORY OF PRESENT ILLNESS: [name] Foster is a 68 y.o. female seen in the multidisciplinary breast clinic for a new diagnosis of left breast cancer. The patient was noted to have a palpable mass in the left breast. She proceeded to diagnostic imaging that revealed a mass at 3:30. Further diagnostic imaggin revealed a 2.8 x 2.8 x 3.6 cm mass and numerous other masses in the upper outer left breast, one at 3:00 measuring 9 mm, at 2:00 measurign 7 mm, at 2:00 also measuring 5 mm, at 1:00 measuring 3 mm, another at 1:00 measuring 4 mm, and more than 4 abnormal appearing lymph nodes. She underwent biopsy of the breast and axilla on 07/29/2019 revealing a grade 3 invasive ductal carcinoma in both breast specimens and carcinoma in the sampled node. Her tumor was ER/PR negative, HER2 amplified with a Ki 67 of 70%. She comes today to discuss treatment for her cancer.     PREVIOUS RADIATION THERAPY: No   PAST MEDICAL HISTORY:  Past Medical History:  Diagnosis Date  . Allergic rhinitis   . Aortic valve disorders   . Asthma   . Hyperlipidemia   . Hypertension   . Long term (current) use of anticoagulants   . Other primary cardiomyopathies   . Personal history of colonic polyps   . Personal history of venous thrombosis and embolism        PAST SURGICAL HISTORY: Past Surgical History:  Procedure Laterality Date  . ABDOMINAL HYSTERECTOMY    . CARDIAC CATHETERIZATION    . DILATION AND CURETTAGE OF UTERUS     several  . Rt knee arthoscopic    . TEE WITHOUT CARDIOVERSION N/A 02/21/2013   Procedure:  TRANSESOPHAGEAL ECHOCARDIOGRAM (TEE);  Surgeon: Lelon Perla, MD;  Location: [facility] ENDOSCOPY;  Service: Cardiovascular;  Laterality: N/A;     FAMILY HISTORY:  Family History  Problem Relation Age of Onset  . Alcohol abuse Other   . Colon cancer Other   . Depression Other   . Hyperlipidemia Other   . Hypertension Other   . Kidney disease Other   . Uterine cancer Other   . Breast cancer Other        cousin  . Stroke Other        grandmother     SOCIAL HISTORY:  reports that she quit smoking about 5 years ago. She smoked 0.25 packs per day. She has never used smokeless tobacco. She reports current alcohol use. She reports that she does not use drugs. The patient is retired, lives in [place] and is separated. 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   ALLERGIES: Aspirin, Penicillins, and Tetanus toxoid   MEDICATIONS:  Current Outpatient Medications  Medication Sig Dispense Refill  . Albuterol Sulfate (PROAIR RESPICLICK) 270 (90 Base) MCG/ACT AEPB Inhale 2 puffs into the lungs every 6 (six) hours as needed. 2 each 1  . amLODipine (NORVASC) 10 MG tablet TAKE 1 TABLET BY MOUTH  DAILY 90 tablet 1  . atorvastatin (LIPITOR) 20 MG tablet TAKE 1 TABLET BY MOUTH  DAILY 90 tablet 1  . cholecalciferol (VITAMIN D) 1000 UNITS tablet Take 1,000 Units by mouth daily.      . fexofenadine (ALLEGRA) 180 MG tablet Take 180 mg by mouth daily.    . fluticasone (FLONASE) 50 MCG/ACT nasal spray USE 2 SPRAYS IN EACH NOSTRIL DAILY. PT NEEDS TO SCHEDULE A FOLLOW UP APPT BEFORE NEXT REFILL. 16 g 0  . furosemide (LASIX) 40 MG tablet TAKE 1 TABLET BY MOUTH  DAILY 90 tablet 1  . hydrALAZINE (APRESOLINE) 50 MG tablet Take 1 tablet (50 mg total) by mouth 3 (three) times daily. 270 tablet 3  . metoprolol tartrate (LOPRESSOR) 50 MG tablet TAKE 1 TABLET BY MOUTH  TWICE DAILY 180 tablet 1  . potassium chloride SA (K-DUR) 20 MEQ tablet TAKE 1 TABLET BY MOUTH  DAILY 90 tablet 1  . ramipril (ALTACE)  10 MG capsule TAKE 1 CAPSULE BY MOUTH  DAILY 90 capsule 1  . warfarin (COUMADIN) 5 MG tablet Take 1 tablet daily or as directed by anticoagulation clinic. 100 tablet 1   No current facility-administered medications for this encounter.      REVIEW OF SYSTEMS: On review of systems, the patient reports that she is doing well overall. She denies any chest pain, shortness of breath, cough, fevers, chills, night sweats, unintended weight changes. She denies any bowel or bladder disturbances, and denies abdominal pain, nausea or vomiting. She denies any new musculoskeletal or joint aches or pains. A complete review of systems is obtained and is otherwise negative.     PHYSICAL EXAM:  Wt Readings from Last 3 Encounters:  11/05/18 204 lb (92.5 kg)  10/14/18 205 lb (93 kg)  11/27/17 195 lb (88.5 kg)   Temp Readings from Last 3 Encounters:  11/05/18 99 F (37.2 C) (Oral)  07/03/17 97.8 F (36.6 C) (Oral)  01/26/16 98.4 F (36.9 C) (Oral)   BP Readings from Last 3 Encounters:  11/05/18 126/72  10/14/18 124/70  04/11/18 136/78   Pulse Readings from Last 3 Encounters:  11/05/18 60  10/14/18 64  04/11/18 (!) 53    In general this is a well appearing African American female in no acute distress. She's alert and oriented x4 and appropriate throughout the examination. Cardiopulmonary assessment is negative for acute distress and she exhibits normal effort.  Bilateral breast exam is deferred.   ECOG = 0  0 - Asymptomatic (Fully active, able to carry on all predisease activities without restriction)  1 - Symptomatic but completely ambulatory (Restricted in physically strenuous activity but ambulatory and able to carry out work of a light or sedentary nature. For example, light housework, office work)  2 - Symptomatic, <50% in bed during the day (Ambulatory and capable of all self care but unable to carry out any work activities. Up and about more than 50% of waking hours)  3 -  Symptomatic, >50% in bed, but not bedbound (Capable of only limited self-care, confined to bed or chair 50% or more of waking hours)  4 - Bedbound (Completely disabled. Cannot carry on any self-care. Totally confined to bed or chair)  5 - Death   Eustace Pen MM, Creech RH, Tormey DC, et al. [phone]). "Toxicity and response criteria of the [facility]". [place] Oncol. 5 (6): 649-55    LABORATORY DATA:  Lab Results  Component Value Date   WBC 6.7 11/05/2018   HGB 13.2 11/05/2018   HCT 39.7 11/05/2018   MCV 87.4 11/05/2018   PLT 198.0  11/05/2018   Lab Results  Component Value Date   NA 145 01/17/2019   K 4.2 01/17/2019   CL 106 01/17/2019   CO2 30 01/17/2019   Lab Results  Component Value Date   ALT 15 11/05/2018   AST 16 11/05/2018   ALKPHOS 117 01/17/2019   BILITOT 0.5 11/05/2018      RADIOGRAPHY: US Breast Ltd Uni Left Inc Axilla  Result Date: 07/24/2019 CLINICAL DATA:  Palpable lump in the left breast. EXAM: DIGITAL DIAGNOSTIC BILATERAL MAMMOGRAM WITH CAD AND TOMO ULTRASOUND LEFT BREAST COMPARISON:  Previous exam(s). ACR Breast Density Category b: There are scattered areas of fibroglandular density. FINDINGS: No suspicious masses, calcifications, or distortion identified in the right breast. There is skin thickening seen in the left breast anteriorly, medially, and inferiorly. The patient is palpating a suspicious mass measuring up to 3.4 cm, containing multiple calcifications. Posterior to the mass are additional suspicious calcifications spanning at least 4.4 cm. There are numerous other suspicious masses in the upper outer left breast. There is an abnormal node in the left axilla. Mammographic images were processed with CAD. On physical exam, a lump is felt in the lateral left breast. Targeted ultrasound is performed, showing the patient's dominant palpable mass in the left breast at 3:30, 8 cm from the nipple measuring 2.8 x 2.8 by 3.6 cm. Numerous other  masses are seen in the upper outer left breast. There is a mass at 3 o'clock, 7 cm from the nipple measuring 9 x 4 by 3 mm. There is a mass in the left breast at 2 o'clock, 9 cm from the nipple measuring 7 x 5 x 7 mm. There is a mass in the left breast at 2 o'clock, 8 cm from the nipple measuring 4 x 2 by 5 mm. There is a mass in the left breast at 1 o'clock, 10 cm from the nipple measuring 3 x 3 by 2 mm. There is a mass in the left breast at 1 o'clock, 11 cm from the nipple measuring 3 x 3 x 4 mm. Greater than 4 abnormal left axillary lymph nodes are identified. IMPRESSION: Highly suspicious mass/masses in the left breast worrisome for malignancy. No abnormalities on the right. RECOMMENDATION: Recommend biopsying the patient's palpable mass in the left breast at 3:30, 8 cm from the nipple. Recommend biopsying the mass in the left breast at 1 o'clock, 11 cm from the nipple to evaluate for extent of disease. Recommend biopsying 1 of the abnormal enlarged lymph nodes in the left axilla. If breast conservation is considered after the biopsies, recommend biopsying the calcifications in the left breast posterior to the palpable mass. These calcifications are very suspicious. I have discussed the findings and recommendations with the patient. If applicable, a reminder letter will be sent to the patient regarding the next appointment. BI-RADS CATEGORY  5: Highly suggestive of malignancy. Electronically Signed   By: Dorise Bullion III M.D   On: 07/24/2019 11:35   Mm Diag Breast Tomo Bilateral  Result Date: 07/24/2019 CLINICAL DATA:  Palpable lump in the left breast. EXAM: DIGITAL DIAGNOSTIC BILATERAL MAMMOGRAM WITH CAD AND TOMO ULTRASOUND LEFT BREAST COMPARISON:  Previous exam(s). ACR Breast Density Category b: There are scattered areas of fibroglandular density. FINDINGS: No suspicious masses, calcifications, or distortion identified in the right breast. There is skin thickening seen in the left breast anteriorly,  medially, and inferiorly. The patient is palpating a suspicious mass measuring up to 3.4 cm, containing multiple calcifications. Posterior to the mass  are additional suspicious calcifications spanning at least 4.4 cm. There are numerous other suspicious masses in the upper outer left breast. There is an abnormal node in the left axilla. Mammographic images were processed with CAD. On physical exam, a lump is felt in the lateral left breast. Targeted ultrasound is performed, showing the patient's dominant palpable mass in the left breast at 3:30, 8 cm from the nipple measuring 2.8 x 2.8 by 3.6 cm. Numerous other masses are seen in the upper outer left breast. There is a mass at 3 o'clock, 7 cm from the nipple measuring 9 x 4 by 3 mm. There is a mass in the left breast at 2 o'clock, 9 cm from the nipple measuring 7 x 5 x 7 mm. There is a mass in the left breast at 2 o'clock, 8 cm from the nipple measuring 4 x 2 by 5 mm. There is a mass in the left breast at 1 o'clock, 10 cm from the nipple measuring 3 x 3 by 2 mm. There is a mass in the left breast at 1 o'clock, 11 cm from the nipple measuring 3 x 3 x 4 mm. Greater than 4 abnormal left axillary lymph nodes are identified. IMPRESSION: Highly suspicious mass/masses in the left breast worrisome for malignancy. No abnormalities on the right. RECOMMENDATION: Recommend biopsying the patient's palpable mass in the left breast at 3:30, 8 cm from the nipple. Recommend biopsying the mass in the left breast at 1 o'clock, 11 cm from the nipple to evaluate for extent of disease. Recommend biopsying 1 of the abnormal enlarged lymph nodes in the left axilla. If breast conservation is considered after the biopsies, recommend biopsying the calcifications in the left breast posterior to the palpable mass. These calcifications are very suspicious. I have discussed the findings and recommendations with the patient. If applicable, a reminder letter will be sent to the patient regarding  the next appointment. BI-RADS CATEGORY  5: Highly suggestive of malignancy. Electronically Signed   By: Dorise Bullion III M.D   On: 07/24/2019 11:35   [place] Axillary Node Core Biopsy Left  Addendum Date: 07/30/2019   ADDENDUM REPORT: 07/30/2019 13:33 ADDENDUM: Pathology revealed GRADE III INVASIVE DUCTAL CARCINOMA of the Left breast, both locations, 3:30 o'clock and 1 o'clock. This was found to be concordant by Dr. Audie Pinto. Pathology revealed METASTATIC BREAST CARCINOMA TO LYMPH NODE of the Left axilla. This was found to be concordant by Dr. Audie Pinto. Pathology results were discussed with the patient by telephone. The patient reported doing well after the biopsies with tenderness and minimal bleeding at the sites. Post biopsy instructions and care were reviewed and questions were answered. The patient was encouraged to call [facility] for any additional concerns. The patient was referred to [facility] on August 06, 2019. Pathology results reported by Terie Purser, RN on 07/30/2019. Electronically Signed   By: Audie Pinto M.D.   On: 07/30/2019 13:33   Result Date: 07/30/2019 CLINICAL DATA:  Patient presents for biopsy of 2 left breast masses as well as a left axillary lymph node. EXAM: ULTRASOUND GUIDED LEFT BREAST CORE NEEDLE BIOPSY x 2 [place] AXILLARY NODE CORE BIOPSY LEFT COMPARISON:  Previous exam(s). FINDINGS: I met with the patient and we discussed the procedure of ultrasound-guided biopsy, including benefits and alternatives. We discussed the high likelihood of a successful procedure. We discussed the risks of the procedure, including infection, bleeding, tissue  injury, clip migration, and inadequate sampling. Informed written consent was given. The usual time-out protocol was performed immediately prior to the procedure. 1.  Lesion quadrant: Lower outer quadrant Using sterile  technique and 1% Lidocaine as local anesthetic, under direct ultrasound visualization, a 12 gauge spring-loaded device was used to perform biopsy of a left breast mass at 3:30 o'clock using a lateral approach. At the conclusion of the procedure ribbon tissue marker clip was deployed into the biopsy cavity. Follow up 2 view mammogram was performed and dictated separately. There was a moderate-sized post biopsy hematoma. 2.  Lesion quadrant: Upper outer quadrant Using sterile technique and 1% Lidocaine as local anesthetic, under direct ultrasound visualization, a 12 gauge spring-loaded device was used to perform biopsy of a left breast mass at 1 o'clock using a lateral approach. At the conclusion of the procedure coil tissue marker clip was deployed into the biopsy cavity. Follow up 2 view mammogram was performed and dictated separately. 3.  Lesion quadrant: Axilla Using sterile technique and 1% Lidocaine as local anesthetic, under direct ultrasound visualization, a 14 gauge spring-loaded device was used to perform biopsy of a left axillary lymph node using a lateral approach. At the conclusion of the procedure [facility] tissue marker clip was deployed into the biopsy cavity. Follow up 2 view mammogram was performed and dictated separately. IMPRESSION: Ultrasound guided biopsy of a left breast mass at 3:30 o'clock, a left breast mass at 1 o'clock, and a left axillary lymph node. Moderate size post biopsy hematoma at the 3:30 o'clock position. Electronically Signed: By: Audie Pinto M.D. On: 07/29/2019 14:09   Mm Clip Placement Left  Result Date: 07/29/2019 CLINICAL DATA:  Patient presents for biopsy of two masses in the left breast as well as biopsy of a left axillary lymph node. EXAM: DIAGNOSTIC LEFT MAMMOGRAM POST ULTRASOUND BIOPSY COMPARISON:  Previous exam(s). FINDINGS: Mammographic images were obtained following ultrasound guided biopsy of a left breast mass at 3:30 o'clock, a left breast mass at 1  o'clock and a left axillary lymph node. The ribbon biopsy marking clip is in expected position at the site of biopsy at 3:30 o'clock. The coil biopsy marking clip is in the expected location of the site of biopsy at 1 o'clock. The [facility] biopsy marking clip is in the expected location at the site of biopsy in the left axilla. IMPRESSION: Appropriate positioning of the ribbon clip at 3:30 o'clock in the left breast, the coil clip at 1 o'clock in the left breast, and the HydroMARK clip in the left axilla. Final Assessment: Post Procedure Mammograms for Marker Placement Electronically Signed   By: Audie Pinto M.D.   On: 07/29/2019 14:05   [place] Lt Breast Bx W Loc Dev 1st Lesion Img Bx Spec US Guide  Addendum Date: 07/30/2019   ADDENDUM REPORT: 07/30/2019 13:33 ADDENDUM: Pathology revealed GRADE III INVASIVE DUCTAL CARCINOMA of the Left breast, both locations, 3:30 o'clock and 1 o'clock. This was found to be concordant by Dr. Audie Pinto. Pathology revealed METASTATIC BREAST CARCINOMA TO LYMPH NODE of the Left axilla. This was found to be concordant by Dr. Audie Pinto. Pathology results were discussed with the patient by telephone. The patient reported doing well after the biopsies with tenderness and minimal bleeding at the sites. Post biopsy instructions and care were reviewed and questions were answered. The patient was encouraged to call [facility] for any additional concerns. The patient was referred to [facility] on August 06, 2019. Pathology results reported by Terie Purser, RN on 07/30/2019. Electronically Signed   By: Audie Pinto M.D.   On: 07/30/2019 13:33   Result Date: 07/30/2019 CLINICAL DATA:  Patient presents for biopsy of 2 left breast masses as well as a left axillary lymph node. EXAM: ULTRASOUND GUIDED LEFT BREAST CORE NEEDLE BIOPSY x 2 [place] AXILLARY NODE CORE BIOPSY  LEFT COMPARISON:  Previous exam(s). FINDINGS: I met with the patient and we discussed the procedure of ultrasound-guided biopsy, including benefits and alternatives. We discussed the high likelihood of a successful procedure. We discussed the risks of the procedure, including infection, bleeding, tissue injury, clip migration, and inadequate sampling. Informed written consent was given. The usual time-out protocol was performed immediately prior to the procedure. 1.  Lesion quadrant: Lower outer quadrant Using sterile technique and 1% Lidocaine as local anesthetic, under direct ultrasound visualization, a 12 gauge spring-loaded device was used to perform biopsy of a left breast mass at 3:30 o'clock using a lateral approach. At the conclusion of the procedure ribbon tissue marker clip was deployed into the biopsy cavity. Follow up 2 view mammogram was performed and dictated separately. There was a moderate-sized post biopsy hematoma. 2.  Lesion quadrant: Upper outer quadrant Using sterile technique and 1% Lidocaine as local anesthetic, under direct ultrasound visualization, a 12 gauge spring-loaded device was used to perform biopsy of a left breast mass at 1 o'clock using a lateral approach. At the conclusion of the procedure coil tissue marker clip was deployed into the biopsy cavity. Follow up 2 view mammogram was performed and dictated separately. 3.  Lesion quadrant: Axilla Using sterile technique and 1% Lidocaine as local anesthetic, under direct ultrasound visualization, a 14 gauge spring-loaded device was used to perform biopsy of a left axillary lymph node using a lateral approach. At the conclusion of the procedure [facility] tissue marker clip was deployed into the biopsy cavity. Follow up 2 view mammogram was performed and dictated separately. IMPRESSION: Ultrasound guided biopsy of a left breast mass at 3:30 o'clock, a left breast mass at 1 o'clock, and a left axillary lymph node. Moderate size post biopsy  hematoma at the 3:30 o'clock position. Electronically Signed: By: Audie Pinto M.D. On: 07/29/2019 14:09   [place] Lt Breast Bx W Loc Dev Ea Add Lesion Img Bx Spec US Guide  Addendum Date: 07/30/2019   ADDENDUM REPORT: 07/30/2019 13:33 ADDENDUM: Pathology revealed GRADE III INVASIVE DUCTAL CARCINOMA of the Left breast, both locations, 3:30 o'clock and 1 o'clock. This was found to be concordant by Dr. Audie Pinto. Pathology revealed METASTATIC BREAST CARCINOMA TO LYMPH NODE of the Left axilla. This was found to be concordant by Dr. Audie Pinto. Pathology results were discussed with the patient by telephone. The patient reported doing well after the biopsies with tenderness and minimal bleeding at the sites. Post biopsy instructions and care were reviewed and questions were answered. The patient was encouraged to call [facility] for any additional concerns. The patient was referred to [facility] on August 06, 2019. Pathology results reported by Terie Purser, RN on 07/30/2019. Electronically Signed   By: Audie Pinto M.D.   On: 07/30/2019 13:33   Result Date: 07/30/2019 CLINICAL DATA:  Patient presents for biopsy of 2 left breast masses as well as a left axillary lymph node. EXAM: ULTRASOUND GUIDED LEFT BREAST CORE NEEDLE BIOPSY x 2  [place] AXILLARY NODE CORE BIOPSY LEFT COMPARISON:  Previous exam(s). FINDINGS: I met with the patient and we discussed the procedure of ultrasound-guided biopsy, including benefits and alternatives. We discussed the high likelihood of a successful procedure. We discussed the risks of the procedure, including infection, bleeding, tissue injury, clip migration, and inadequate sampling. Informed written consent was given. The usual time-out protocol was performed immediately prior to the procedure. 1.  Lesion quadrant: Lower outer quadrant Using sterile technique and 1%  Lidocaine as local anesthetic, under direct ultrasound visualization, a 12 gauge spring-loaded device was used to perform biopsy of a left breast mass at 3:30 o'clock using a lateral approach. At the conclusion of the procedure ribbon tissue marker clip was deployed into the biopsy cavity. Follow up 2 view mammogram was performed and dictated separately. There was a moderate-sized post biopsy hematoma. 2.  Lesion quadrant: Upper outer quadrant Using sterile technique and 1% Lidocaine as local anesthetic, under direct ultrasound visualization, a 12 gauge spring-loaded device was used to perform biopsy of a left breast mass at 1 o'clock using a lateral approach. At the conclusion of the procedure coil tissue marker clip was deployed into the biopsy cavity. Follow up 2 view mammogram was performed and dictated separately. 3.  Lesion quadrant: Axilla Using sterile technique and 1% Lidocaine as local anesthetic, under direct ultrasound visualization, a 14 gauge spring-loaded device was used to perform biopsy of a left axillary lymph node using a lateral approach. At the conclusion of the procedure [facility] tissue marker clip was deployed into the biopsy cavity. Follow up 2 view mammogram was performed and dictated separately. IMPRESSION: Ultrasound guided biopsy of a left breast mass at 3:30 o'clock, a left breast mass at 1 o'clock, and a left axillary lymph node. Moderate size post biopsy hematoma at the 3:30 o'clock position. Electronically Signed: By: Audie Pinto M.D. On: 07/29/2019 14:09       IMPRESSION/PLAN: 1. Stage IIIB, [id number] grade 3, HER2 amplifed invasive ductal carcinoma of the left breast. Dr. Lisbeth Renshaw discusses the pathology findings and reviews the nature of node positive HER2 amplified breast disease. The consensus from the breast conference includes proceeding with staging MRI scan, and neoadjuvant chemotherapy.  She would most likely benefit from mastectomy with lymph node staging to be  determined.  Given her node positivity, she would also benefit from adjuvant radiotherapy to the chest wall and regional lymph nodes.  We discussed the risks, benefits, short, and long term effects of radiotherapy, and the patient is interested in proceeding at the appropriate interval. Dr. Lisbeth Renshaw discusses the delivery and logistics of radiotherapy and anticipates a course of 6 1/2 weeks of radiotherapy.  We will see her back following completion of surgical resection.  She is aware that radiotherapy would typically begin about a month after surgery.   In a visit lasting 35 minutes, greater than 50% of the time was spent face to face discussing her case, and coordinating the patient's care.  The above documentation reflects my direct findings during this shared patient visit. Please see the separate note by Dr. Lisbeth Renshaw on this date for the remainder of the patient's plan of care.    Carola Rhine, PAC

## 2019-08-06 NOTE — Assessment & Plan Note (Signed)
08/01/2019:Patient palpated left breast lump and skin thickening. Mammogram showed a 3.6cm mass at 3:30 position, a 0.9cm mass at 3:00 position, a 0.7cm mass at 2:00 position, a 0.5cm mass at 2:00 position, a 0.3cm mass at 1:00 position, and a 0.4cm mass at 1:00 position, with 4 abnormal left axillary lymph nodes. Biopsy showed IDC, grade 3, HER-2 + (3+), ER/PR -, Ki67 70%, in the breast and lymph nodes.  Pathology and radiology counseling: Discussed with the patient, the details of pathology including the type of breast cancer,the clinical staging, the significance of ER, PR and HER-2/neu receptors and the implications for treatment. After reviewing the pathology in detail, we proceeded to discuss the different treatment options between surgery, radiation, chemotherapy, antiestrogen therapies.  Recommendation based on multidisciplinary tumor board: 1. Neoadjuvant chemotherapy with TCH Perjeta 6 cycles followed by Herceptin Perjeta maintenance versus Kadcyla maintenance (depending on response to chemo) for 1 year 2. Followed by mastectomy with targeted node surgery 3. Followed by adjuvant radiation therapy   Chemotherapy Counseling: I discussed the risks and benefits of chemotherapy including the risks of nausea/ vomiting, risk of infection from low WBC count, fatigue due to chemo or anemia, bruising or bleeding due to low platelets, mouth sores, loss/ change in taste and decreased appetite. Liver and kidney function will be monitored through out chemotherapy as abnormalities in liver and kidney function may be a side effect of treatment. Cardiac dysfunction due to Herceptin and Perjeta were discussed in detail.  Neuropathy risk from Taxol and risk of permanent bone marrow dysfunction due to chemo were also discussed.  Plan: 1. Port placement 2. Echocardiogram 3. Chemotherapy class 4. Breast MRI 5. CT chest abdomen pelvis and bone scan for staging  Return to clinic in 2 weeks to start  chemotherapy.

## 2019-08-06 NOTE — Addendum Note (Signed)
Addended by: Rennis Harding on: 08/06/2019 11:22 AM   Modules accepted: Orders

## 2019-08-06 NOTE — Progress Notes (Signed)
[facility] Psychosocial Distress Screening Clinical Social Work  Clinical Social Work was referred by distress screening protocol.  The patient scored a 10 on the Psychosocial Distress Thermometer which indicates severe distress. Counseling Intern met with patient in exam room to assess for distress and other psychosocial needs.  ONCBCN DISTRESS SCREENING 08/06/2019  Distress experienced in past week (1-10) 10  Information Concerns Type Lack of info about diagnosis;Lack of info about treatment;Lack of info about complementary therapy choices  Referral to support programs Yes   Clinical Social Worker follow up needed: No.  Counseling Intern Notes: Met with patient in exam room during Breast Clinic on 08/06/2019 as representative from the Patient and Family Support Team and to assess needs for patient support. Pt reported experiencing high distress this week because of the uncertainty of having a cancer diagnosis and not knowing the details or treatment plan. Pt reported she is feeling less distressed now that she has information and knows the plan for tx.  Pt stated her sister attended Breast Clinic with her and will provide emotional support during her cancer experience.  Pt reported that she lives with her son, but has not told him yet about her dx.  Pt reported worrying that her son will be upset by her dx. Counselor informed pt that her son and other family members can also take advantage of support services (counseling or support groups).  Pt also reported that her mother passed away from complications related to breast cancer, so this dx is bringing up memories and grief. Pt stated she is considering her need for emotional support at this time and will reach out if need be. Counseling intern will check-in with pt in 2 weeks to assess distress level at that time and remind pt about support servicesavailable.    [facility] [place] Counseling Intern Voicemail:  [phone]

## 2019-08-06 NOTE — Progress Notes (Signed)
START ON PATHWAY REGIMEN - Breast     A cycle is every 21 days:     Pertuzumab      Pertuzumab      Trastuzumab-xxxx      Trastuzumab-xxxx      Carboplatin      Docetaxel   **Always confirm dose/schedule in your pharmacy ordering system**  Patient Characteristics: Preoperative or Nonsurgical Candidate (Clinical Staging), Neoadjuvant Therapy followed by Surgery, Invasive Disease, Chemotherapy, HER2 Positive, ER Negative/Unknown Therapeutic Status: Preoperative or Nonsurgical Candidate (Clinical Staging) AJCC M Category: cM0 AJCC Grade: G3 Breast Surgical Plan: Neoadjuvant Therapy followed by Surgery ER Status: Negative (-) AJCC 8 Stage Grouping: IIIB HER2 Status: Positive (+) AJCC T Category: cT4a AJCC N Category: cN1 PR Status: Negative (-) Intent of Therapy: Curative Intent, Discussed with Patient

## 2019-08-06 NOTE — Progress Notes (Signed)
REFERRING PROVIDER: Nicholas Lose, MD [place],  [place] [address]  PRIMARY PROVIDER:  Burnis Medin, MD  PRIMARY REASON FOR VISIT:  1. Malignant neoplasm of overlapping sites of left breast in female, estrogen receptor negative ([place])   2. Family history of breast cancer   3. Family history of throat cancer   4. Family history of cervical cancer   5. Family history of colon cancer mom     I connected with [name] Foster on 08/06/2019 at 12:15 PM EDT by Webex and verified that I am speaking with the correct person using two identifiers.    Patient location: [facility] Provider location: office  HISTORY OF PRESENT ILLNESS:   [name] Foster, a 68 y.o. female, was seen for a [facility] cancer genetics consultation at the request of Dr. Lindi Adie due to a personal and family history of cancer.  [name] Foster presents to clinic today to discuss the possibility of a hereditary predisposition to cancer, genetic testing, and to further clarify her future cancer risks, as well as potential cancer risks for family members.   In 2020, at the age of 11, [name] Foster was diagnosed with invasive ductal carcinoma of the left breast, ER/PR-, Her2+. The treatment plan includes neoadjuvant chemotherapy, mastectomy, and radiation.    CANCER HISTORY:  Oncology History  Malignant neoplasm of overlapping sites of left breast in female, estrogen receptor negative ([place])  08/01/2019 Initial Diagnosis   Patient palpated left breast lump and skin thickening. Mammogram showed a 3.6cm mass at 3:30 position, a 0.9cm mass at 3:00 position, a 0.7cm mass at 2:00 position, a 0.5cm mass at 2:00 position, a 0.3cm mass at 1:00 position, and a 0.4cm mass at 1:00 position, with 4 abnormal left axillary lymph nodes. Biopsy showed IDC, grade 3, HER-2 + (3+), ER/PR -, Ki67 70%, in the breast and lymph nodes.    08/06/2019 Cancer Staging   Staging form: Breast, AJCC 8th Edition - Clinical: Stage IIIB (cT4, cN1, cM0,  G3, ER-, PR-, HER2+) - Signed by Nicholas Lose, MD on 08/06/2019   08/19/2019 -  Chemotherapy   The patient had palonosetron (ALOXI) injection 0.25 mg, 0.25 mg, Intravenous,  Once, 0 of 6 cycles pegfilgrastim-jmdb (FULPHILA) injection 6 mg, 6 mg, Subcutaneous,  Once, 0 of 6 cycles CARBOplatin (PARAPLATIN) 429.6 mg in sodium chloride 0.9 % 250 mL chemo infusion, 429.6 mg (100 % of original dose 429.6 mg), Intravenous,  Once, 0 of 6 cycles Dose modification:   (original dose 429.6 mg, Cycle 1) DOCEtaxel (TAXOTERE) 160 mg in sodium chloride 0.9 % 250 mL chemo infusion, 75 mg/m2 = 160 mg, Intravenous,  Once, 0 of 6 cycles pertuzumab (PERJETA) 420 mg in sodium chloride 0.9 % 250 mL chemo infusion, 420 mg (100 % of original dose 420 mg), Intravenous, Once, 0 of 6 cycles Dose modification: 420 mg (original dose 420 mg, Cycle 1, Reason: Provider Judgment) fosaprepitant (EMEND) 150 mg, dexamethasone (DECADRON) 12 mg in sodium chloride 0.9 % 145 mL IVPB, , Intravenous,  Once, 0 of 6 cycles trastuzumab-dkst (OGIVRI) 756 mg in sodium chloride 0.9 % 250 mL chemo infusion, 8 mg/kg = 756 mg, Intravenous,  Once, 0 of 6 cycles  for chemotherapy treatment.       RISK FACTORS:  Menarche was at age 2.  First live birth at age 11.   Ovaries intact: no.  Hysterectomy: yes- in 1990s Menopausal status: postmenopausal.  Colonoscopy: yes; normal. Mammogram within the last year: yes.   Past Medical History:  Diagnosis Date  . Allergic rhinitis   . Aortic valve disorders   . Asthma   . Family history of breast cancer   . Family history of cervical cancer   . Family history of colon cancer   . Family history of throat cancer   . Hyperlipidemia   . Hypertension   . Long term (current) use of anticoagulants   . Other primary cardiomyopathies   . Personal history of colonic polyps   . Personal history of venous thrombosis and embolism     Past Surgical History:  Procedure Laterality Date  . ABDOMINAL  HYSTERECTOMY    . CARDIAC CATHETERIZATION    . DILATION AND CURETTAGE OF UTERUS     several  . Rt knee arthoscopic    . TEE WITHOUT CARDIOVERSION N/A 02/21/2013   Procedure: TRANSESOPHAGEAL ECHOCARDIOGRAM (TEE);  Surgeon: Lelon Perla, MD;  Location: [facility] ENDOSCOPY;  Service: Cardiovascular;  Laterality: N/A;    Social History   Socioeconomic History  . Marital status: Legally Separated    Spouse name: Not on file  . Number of children: Not on file  . Years of education: Not on file  . Highest education level: Not on file  Occupational History  . Not on file  Social Needs  . Financial resource strain: Not on file  . Food insecurity    Worry: Not on file    Inability: Not on file  . Transportation needs    Medical: Not on file    Non-medical: Not on file  Tobacco Use  . Smoking status: Current Every Day Smoker    Packs/day: 0.25    Last attempt to quit: 10/21/2013    Years since quitting: 5.7  . Smokeless tobacco: Never Used  Substance and Sexual Activity  . Alcohol use: Yes    Comment: rare occasion  . Drug use: No  . Sexual activity: Not on file  Lifestyle  . Physical activity    Days per week: Not on file    Minutes per session: Not on file  . Stress: Not on file  Relationships  . Social Herbalist on phone: Not on file    Gets together: Not on file    Attends religious service: Not on file    Active member of club or organization: Not on file    Attends meetings of clubs or organizations: Not on file    Relationship status: Not on file  Other Topics Concern  . 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    Divorced   Regular exercise- no   5 hours sleep    Lives with a son age 102    No pets           FAMILY HISTORY:  We obtained a detailed, 4-generation family history.  Significant diagnoses are listed below: Family History  Problem Relation Age of Onset  . Alcohol abuse Other   . Depression  Other   . Hyperlipidemia Other   . Hypertension Other   . Kidney disease Other   . Colon cancer Mother        dx late 4s  . Cervical cancer Maternal Grandmother   . Stroke Paternal Grandmother   . Breast cancer Maternal Aunt        dx 46s  . Breast cancer Cousin        dx 89s   [name] Foster has 2 sons, no cancer  history. She has 1 sister, Arville Go, who has not had cancer.   Ms. Alease Frame mother was diagnosed with colon cancer in her late 72s and died at 39. Patient had 3 maternal aunts, 2 maternal uncles. One of her aunts had breast cancer in her 22s. This aunt had a daughter who had throat cancer. A maternal cousin had breast cancer in her 22s. No information about maternal grandfather. Maternal grandmother had cervical cancer and died at 26.  [name] Foster's father died at 39, no cancers. Patient has paternal aunts, uncles and cousins but doesn't have much information about them. No cancers she is aware of. No cancers for paternal grandparents.  [name] Foster is unaware of previous family history of genetic testing for hereditary cancer risks. There is no reported Ashkenazi Jewish ancestry. There is no known consanguinity.  GENETIC COUNSELING ASSESSMENT: [name] Foster is a 68 y.o. female with a personal and family history of breast cancer which is somewhat suggestive of a hereditary cancer syndrome and predisposition to cancer. We, therefore, discussed and recommended the following at today's visit.   DISCUSSION: We discussed that 5 - 10% of breast cancer is hereditary, with most cases associated with BRCA1/BRCA2 mutations.  There are other genes that can be associated with hereditary breast cancer syndromes, and genes that can be associated with colon cancer and other cancers not seen in her family.  We discussed that testing is beneficial for several reasons including surgical decision-making for breast cancer, knowing how to follow individuals after completing their treatment, and understand if  other family members could be at risk for cancer and allow them to undergo genetic testing.   We reviewed the characteristics, features and inheritance patterns of hereditary cancer syndromes. We also discussed genetic testing, including the appropriate family members to test, the process of testing, insurance coverage and turn-around-time for results. We discussed the implications of a negative, positive and/or variant of uncertain significant result. We recommended [name] Foster pursue genetic testing for the Common Hereditary Cancers gene panel.   The Common Hereditary Cancers Panel offered by [facility] includes sequencing and/or deletion duplication testing of the following 48 genes: APC, ATM, AXIN2, BARD1, BMPR1A, BRCA1, BRCA2, BRIP1, CDH1, CDKN2A (p14ARF), CDKN2A (p16INK4a), CKD4, CHEK2, CTNNA1, DICER1, EPCAM (Deletion/duplication testing only), GREM1 (promoter region deletion/duplication testing only), KIT, MEN1, MLH1, MSH2, MSH3, MSH6, MUTYH, NBN, NF1, NHTL1, PALB2, PDGFRA, PMS2, POLD1, POLE, PTEN, RAD50, RAD51C, RAD51D, RNF43, SDHB, SDHC, SDHD, SMAD4, SMARCA4. STK11, TP53, TSC1, TSC2, and VHL.  The following genes were evaluated for sequence changes only: SDHA and HOXB13 c.251G>A variant only.  Based on [name] Foster's personal and family history of cancer, she meets medical criteria for genetic testing. Despite that she meets criteria, she may still have an out of pocket cost.  PLAN: After considering the risks, benefits, and limitations, [name] Foster provided informed consent to pursue genetic testing and the blood sample was sent to [facility] for analysis of the Common Hereditary Cancers Panel. Results should be available within approximately 2-3 weeks' time, at which point they will be disclosed by telephone to [name] Foster, as will any additional recommendations warranted by these results. [name] Foster will receive a summary of her genetic counseling visit and a copy of her results once  available. This information will also be available in Epic.   Ms. Alease Frame questions were answered to her satisfaction today. Our contact information was provided should additional questions or concerns arise. Thank you for the referral and allowing [place] to share in the care  of your patient.   Faith Rogue, MS, [facility] Genetic Counselor [email or web address] Phone: [phone]  The patient was seen for a total of 15 minutes in virtual genetic counseling. Patient's sister, Arville Go, was also present. Drs. Magrinat, Lindi Adie and/or Burr Medico were available for discussion regarding this case.   _______________________________________________________________________ For Office Staff:  Number of people involved in session: 2 Was an Intern/ student involved with case: no

## 2019-08-06 NOTE — Research (Signed)
Procurement of Human Biospecimens for the Discovery and  Validation of Biomarkers for the Prediction, Diagnosis and Management of Disease  The study was introduced to the patient by Dr.Gudena. I met with the patient after her appointment to explain the study including purpose of the study, risks/benefits, participation requirements, voluntary participation and informed she will receive a $50 VISA gift card once research blood is drawn. Patient verbalizes understanding and agreeable to participate in the study. Patient signed consent/ HIPPA and signed copies were given to patient. Patient was thanked for her time and participation in the study. Farris Has [facility]  08/06/19

## 2019-08-06 NOTE — Therapy (Signed)
[place], [place], [address] Phone: [phone]   Fax:  [phone]  Physical Therapy Evaluation  Patient Details  Name: [name] Foster MRN: [id number] Date of Birth: November 18, 1950 Referring Provider (PT): Dr. Stark Klein   Encounter Date: 08/06/2019  PT End of Session - 08/06/19 1158    Visit Number  1    Number of Visits  8    Date for PT Re-Evaluation  09/03/19    PT Start Time  1028    PT Stop Time  1102    PT Time Calculation (min)  34 min    Activity Tolerance  Patient tolerated treatment well    Behavior During Therapy  [facility] for tasks assessed/performed       Past Medical History:  Diagnosis Date  . Allergic rhinitis   . Aortic valve disorders   . Asthma   . Hyperlipidemia   . Hypertension   . Long term (current) use of anticoagulants   . Other primary cardiomyopathies   . Personal history of colonic polyps   . Personal history of venous thrombosis and embolism     Past Surgical History:  Procedure Laterality Date  . ABDOMINAL HYSTERECTOMY    . CARDIAC CATHETERIZATION    . DILATION AND CURETTAGE OF UTERUS     several  . Rt knee arthoscopic    . TEE WITHOUT CARDIOVERSION N/A 02/21/2013   Procedure: TRANSESOPHAGEAL ECHOCARDIOGRAM (TEE);  Surgeon: Lelon Perla, MD;  Location: [facility] ENDOSCOPY;  Service: Cardiovascular;  Laterality: N/A;    There were no vitals filed for this visit.   Subjective Assessment - 08/06/19 1135    Subjective  Patient reports she is here today to be seen by her medical team for her newly diagnosed left breast cancer. She also has limited left shoulder ROM and pain with some activities.    Patient is accompained by:  Family member    Pertinent History  Patient was diagnosed on 07/24/2019 with left grade III invasive ductal carcinoma breast cancer. There are 2 masses in the left breast: one measures 3.6 cm in the lower outer quadrant and the other measures 4  mm in the upper outer quadrant. They are ER/PR negative and HER2 positive with a Ki67 of 70%. She has 4 abnormal appearing axillary lymph nodes. One was biopsied and found to be positive. She had a right rotator cuff repair in 07/2016 and has had left shoulder pain since that time. She has never had her left shoulder ROM limitation or pain assessed by a physician.    Patient Stated Goals  Reduce lymphedema risk and learn post op shoulder ROM HEP; improve left shoulder ROM pre-operatively to allow for better surgical outcomes and radiation positioning    Currently in Pain?  No/denies   Left shoulder only hurts with end ROM internal rotation        [facility] PT Assessment - 08/06/19 0001      Assessment   Medical Diagnosis  Left breast cancer; left shoulder ROM stiffness    Referring Provider (PT)  Dr. Stark Klein    Onset Date/Surgical Date  07/24/19    Hand Dominance  Right    Prior Therapy  none      Precautions   Precautions  Other (comment)    Precaution Comments  active cancer      Restrictions   Weight Bearing Restrictions  No      Balance Screen   Has the patient fallen in  the past 6 months  No    Has the patient had a decrease in activity level because of a fear of falling?   No    Is the patient reluctant to leave their home because of a fear of falling?   No      Home Environment   Living Environment  Private residence    Living Arrangements  Children   40 y.o. son   Available Help at Discharge  Family      Prior Function   Level of [place]  Retired    Leisure  She does not exercise      Cognition   Overall Cognitive Status  Within Functional Limits for tasks assessed      Posture/Postural Control   Posture/Postural Control  Postural limitations    Postural Limitations  Rounded Shoulders;Forward head;Increased thoracic kyphosis      ROM / Strength   AROM / PROM / Strength  AROM;Strength      AROM   Overall AROM Comments  Cervical  AROM is WNL    AROM Assessment Site  Shoulder    Right/Left Shoulder  Right;Left    Right Shoulder Extension  46 Degrees    Right Shoulder Flexion  126 Degrees    Right Shoulder ABduction  150 Degrees    Right Shoulder Internal Rotation  55 Degrees    Right Shoulder External Rotation  80 Degrees    Left Shoulder Extension  62 Degrees    Left Shoulder Flexion  115 Degrees    Left Shoulder ABduction  110 Degrees    Left Shoulder Internal Rotation  60 Degrees    Left Shoulder External Rotation  72 Degrees      Strength   Strength Assessment Site  Shoulder    Right/Left Shoulder  Left    Left Shoulder Flexion  4-/5    Left Shoulder Extension  5/5    Left Shoulder ABduction  4-/5    Left Shoulder Internal Rotation  5/5    Left Shoulder External Rotation  4-/5      Palpation   Palpation comment  Tender to palpation left bicipital groove and left acromion anteriorly      Special Tests    Special Tests  Rotator Cuff Impingement    Rotator Cuff Impingment tests  Empty Can test      Empty Can test   Findings  Positive    Side  Left    Comment  Pain with testing in left shoulder joint anteriorly        LYMPHEDEMA/ONCOLOGY QUESTIONNAIRE - 08/06/19 1156      Type   Cancer Type  Left breast cancer      Lymphedema Assessments   Lymphedema Assessments  Upper extremities      Right Upper Extremity Lymphedema   10 cm Proximal to Olecranon Process  35.4 cm    Olecranon Process  28.5 cm    10 cm Proximal to Ulnar Styloid Process  24.6 cm    Just Proximal to Ulnar Styloid Process  18.4 cm    Across Hand at [employer]  20 cm    At [place] of 2nd Digit  6.3 cm      Left Upper Extremity Lymphedema   10 cm Proximal to Olecranon Process  35.5 cm    Olecranon Process  28.6 cm    10 cm Proximal to Ulnar Styloid Process  23.8 cm    Just Proximal  to Ulnar Styloid Process  18.1 cm    Across Hand at [employer]  19.5 cm    At [place] of 2nd Digit  6.1 cm          [name] -  08/06/19 0001    Open a tight or new jar  No difficulty    Do heavy household chores (wash walls, wash floors)  No difficulty    Carry a shopping bag or briefcase  No difficulty    Wash your back  Mild difficulty    Use a knife to cut food  No difficulty    Recreational activities in which you take some force or impact through your arm, shoulder, or hand (golf, hammering, tennis)  No difficulty    During the past week, to what extent has your arm, shoulder or hand problem interfered with your normal social activities with family, friends, neighbors, or groups?  Not at all    During the past week, to what extent has your arm, shoulder or hand problem limited your work or other regular daily activities  Not at all    Arm, shoulder, or hand pain.  None    Tingling (pins and needles) in your arm, shoulder, or hand  None    Difficulty Sleeping  No difficulty    DASH Score  2.27 %        Objective measurements completed on examination: See above findings.        Patient was instructed today in a home exercise program today for post op shoulder range of motion. These included active assist shoulder flexion in sitting, scapular retraction, wall walking with shoulder abduction, and hands behind head external rotation.  She was encouraged to do these twice a day, holding 3 seconds and repeating 5 times when permitted by her physician.          PT Education - 08/06/19 1157    Education Details  Lymphedema risk reduction and post op shoulder ROM HEP    Person(s) Educated  Patient;Other (comment)   Sister   Methods  Explanation;Demonstration;Handout    Comprehension  Returned demonstration;Verbalized understanding          PT Long Term Goals - 08/06/19 1208      PT LONG TERM GOAL #1   Title  Patient will be independent with her HEP for improving shoulder ROM.    Time  4    Period  Weeks    Status  New    Target Date  09/03/19      PT LONG TERM GOAL #2   Title  Patient will  improve left shoulder flexion AROM to be >/= 130 for increased ease reaching overhead.    Time  4    Period  Weeks    Status  New    Target Date  09/03/19      PT LONG TERM GOAL #3   Title  Patient will increase left shoulder abduction ROM to >/= 120 degrees to obtai radiation positioning.    Time  4    Period  Weeks    Status  New    Target Date  09/03/19      PT LONG TERM GOAL #4   Title  Patient will report she can reach behind her back to wash with >/= 25% less difficulty and pain.    Time  4    Period  Weeks    Status  New    Target Date  09/03/19  Breast Clinic Goals - 08/06/19 1208      Patient will be able to verbalize understanding of pertinent lymphedema risk reduction practices relevant to her diagnosis specifically related to skin care.   Time  1    Period  Days    Status  Achieved      Patient will be able to return demonstrate and/or verbalize understanding of the post-op home exercise program related to regaining shoulder range of motion.   Time  1    Period  Days    Status  Achieved      Patient will be able to verbalize understanding of the importance of attending the postoperative After Breast Cancer Class for further lymphedema risk reduction education and therapeutic exercise.   Time  1    Period  Days    Status  Achieved            Plan - 08/06/19 1159    Clinical Impression Statement  Patient was diagnosed on 07/24/2019 with left grade III invasive ductal carcinoma breast cancer. There are 2 masses in the left breast: one measures 3.6 cm in the lower outer quadrant and the other measures 4 mm in the upper outer quadrant. They are ER/PR negative and HER2 positive with a Ki67 of 70%. She has 4 abnormal appearing axillary lymph nodes. One was biopsied and found to be positive. She had a right rotator cuff repair in 07/2016 and has had left shoulder pain since that time. She has never had her left shoulder ROM limitation or pain assessed by a  physician. Her multidisciplinary medical team prior to her assessments to determine a recommended treatment plan. She is planning to have neoadjuvant chemotherapy followed by a left mastectomy and axillary lymph node dissection and radiation. She will be at high risk for lymphedema as her BMI is > 30 and she is having an ALND with radiation to the axillary region. She will benefit from physical therapy to improve her left shoulder ROM pre-operatively to improve surgical outcomes and prepare her for radiation positioning. She will also benefit from a post op PT reassessment to determine needs.    Stability/Clinical Decision Making  Stable/Uncomplicated    Clinical Decision Making  Low    Rehab Potential  Excellent    PT Frequency  2x / week    PT Duration  4 weeks    PT Treatment/Interventions  ADLs/Self Care Home Management;Therapeutic exercise;Patient/family education;Moist Heat;Therapeutic activities;Manual techniques;Joint Manipulations;Passive range of motion;Cryotherapy   These interventions are for PT before surgery; new plan will be set post operatively   PT Next Visit Plan  Begin PROM left shoulder; ROM exercises; heat/ice PRN    PT Home Exercise Plan  post op shoulder ROM HEP    Consulted and Agree with Plan of Care  Patient;Family member/caregiver    Family Member Consulted  sister       Patient will benefit from skilled therapeutic intervention in order to improve the following deficits and impairments:  Postural dysfunction, Decreased knowledge of precautions, Impaired UE functional use, Pain, Decreased range of motion, Decreased strength  Visit Diagnosis: Malignant neoplasm of overlapping sites of left breast in female, estrogen receptor negative ([place]) - Plan: PT plan of care cert/re-cert  Abnormal posture - Plan: PT plan of care cert/re-cert  Stiffness of left shoulder, not elsewhere classified - Plan: PT plan of care cert/re-cert   Patient will follow up at outpatient cancer  rehab 3-4 weeks following surgery.  If the patient requires physical therapy  at that time, a specific plan will be dictated and sent to the referring physician for approval. The patient was educated today on appropriate basic range of motion exercises to begin post operatively and the importance of attending the After Breast Cancer class following surgery.  Patient was educated today on lymphedema risk reduction practices as it pertains to recommendations that will benefit the patient immediately following surgery.  She verbalized good understanding.      Problem List Patient Active Problem List   Diagnosis Date Noted  . Malignant neoplasm of overlapping sites of left breast in female, estrogen receptor negative ([place]) 08/01/2019  . Family history of colon cancer mom 11/05/2018  . Long term (current) use of anticoagulants 09/17/2017  . Encounter for therapeutic drug monitoring 11/21/2013  . Asthma with bronchitis 09/01/2013  . Chest pain 01/01/2013  . Aortic insufficiency 10/25/2011  . Chronic anticoagulation 09/29/2011  . Nocturnal dyspnea 09/29/2011  . DVT (deep venous thrombosis) ([place]) 11/17/2010  . VITAMIN D DEFICIENCY 08/06/2009  . DVT 01/26/2009  . KNEE PAIN, RIGHT 06/19/2008  . EDEMA 06/19/2008  . ANKLE PAIN, LEFT 02/13/2008  . HYPERKALEMIA 08/20/2007  . HYPOKALEMIA 08/20/2007  . HYPERLIPIDEMIA 07/18/2007  . Cardiomyopathy, hypertensive ([place]) 07/18/2007  . Essential hypertension 06/26/2007  . DISEASE, HYPERTENSIVE HEART NOS W/HF 06/26/2007  . Congestive heart failure ([place]) 06/26/2007  . Allergic rhinitis, cause unspecified 06/26/2007  . ASTHMA 06/26/2007  . SYMPTOM, SYNCOPE AND COLLAPSE 06/26/2007  . HEADACHE 06/26/2007  . HEART MURMUR, HX OF 06/26/2007  . DVT, HX OF 06/26/2007   Annia Friendly, PT 08/06/19 12:13 PM  [place], [place], [address] Phone: [phone]   Fax:   [phone]  Name: [name] Foster MRN: [id number] Date of Birth: 06-03-1951

## 2019-08-06 NOTE — Progress Notes (Signed)
[id number]: Referral Dr. Lindi Adie referred patient to study this afternoon. I met with patient, who is here with her daughter, to introduce her to the study. I confirmed with patient that MD gave her a brief overview of the study. I also provided patient with a brief overview of the study and what the assessments entail. I informed the patient about the baseline assessments and that should she interested in participating in study, the baseline assessments would need to be completed prior to the start of the taxane therapy. Patient gave her verbal understanding. Patient was provided with the study consent and authorization forms for her review and we made arrangements for me to contact her next week, Monday to see if she had any questions. All patient's questions answered to her satisfaction. Patient thanked for her time and interest in study and was encouraged to contact Dr. Lindi Adie or myself with any questions or concerns. Patient was provided with my contact information. Maxwell Marion, RN, BSN, [facility] Clinical Research 08/06/2019 3:45 PM

## 2019-08-08 ENCOUNTER — Ambulatory Visit: Payer: Medicare Other

## 2019-08-08 ENCOUNTER — Other Ambulatory Visit: Payer: Self-pay

## 2019-08-08 DIAGNOSIS — R293 Abnormal posture: Secondary | ICD-10-CM | POA: Diagnosis not present

## 2019-08-08 DIAGNOSIS — M25612 Stiffness of left shoulder, not elsewhere classified: Secondary | ICD-10-CM

## 2019-08-08 DIAGNOSIS — C50812 Malignant neoplasm of overlapping sites of left female breast: Secondary | ICD-10-CM

## 2019-08-08 DIAGNOSIS — Z171 Estrogen receptor negative status [ER-]: Secondary | ICD-10-CM | POA: Diagnosis not present

## 2019-08-08 NOTE — Therapy (Signed)
[place], [place], [address] Phone: [phone]   Fax:  [phone]  Physical Therapy Treatment  Patient Details  Name: [name] Foster MRN: [id number] Date of Birth: 1950/12/10 Referring Provider (PT): Dr. Stark Klein   Encounter Date: 08/08/2019  PT End of Session - 08/08/19 0904    Visit Number  2    Number of Visits  8    Date for PT Re-Evaluation  09/03/19    PT Start Time  0900    PT Stop Time  0953    PT Time Calculation (min)  53 min    Activity Tolerance  Patient tolerated treatment well    Behavior During Therapy  [facility] for tasks assessed/performed       Past Medical History:  Diagnosis Date  . Allergic rhinitis   . Aortic valve disorders   . Asthma   . Family history of breast cancer   . Family history of cervical cancer   . Family history of colon cancer   . Family history of throat cancer   . Hyperlipidemia   . Hypertension   . Long term (current) use of anticoagulants   . Other primary cardiomyopathies   . Personal history of colonic polyps   . Personal history of venous thrombosis and embolism     Past Surgical History:  Procedure Laterality Date  . ABDOMINAL HYSTERECTOMY    . CARDIAC CATHETERIZATION    . DILATION AND CURETTAGE OF UTERUS     several  . Rt knee arthoscopic    . TEE WITHOUT CARDIOVERSION N/A 02/21/2013   Procedure: TRANSESOPHAGEAL ECHOCARDIOGRAM (TEE);  Surgeon: Lelon Perla, MD;  Location: [facility] ENDOSCOPY;  Service: Cardiovascular;  Laterality: N/A;    There were no vitals filed for this visit.  Subjective Assessment - 08/08/19 0904    Subjective  Pt reports that she has been performing her exercises at home. She states that she has no pain in her shoulder currently.    Pertinent History  Patient was diagnosed on 07/24/2019 with left grade III invasive ductal carcinoma breast cancer. There are 2 masses in the left breast: one measures 3.6 cm in the  lower outer quadrant and the other measures 4 mm in the upper outer quadrant. They are ER/PR negative and HER2 positive with a Ki67 of 70%. She has 4 abnormal appearing axillary lymph nodes. One was biopsied and found to be positive. She had a right rotator cuff repair in 07/2016 and has had left shoulder pain since that time. She has never had her left shoulder ROM limitation or pain assessed by a physician.    Patient Stated Goals  Reduce lymphedema risk and learn post op shoulder ROM HEP; improve left shoulder ROM pre-operatively to allow for better surgical outcomes and radiation positioning    Currently in Pain?  No/denies                       [facility] Adult PT Treatment/Exercise - 08/08/19 0001      Exercises   Exercises  Shoulder      Shoulder Exercises: Supine   Horizontal ABduction  AROM;Both;10 reps    Horizontal ABduction Limitations  VC and demonstration for correct movement. Tactile cueing to decrease shoulder shrug on the L.     External Rotation  AROM;Both;10 reps    External Rotation Limitations  supine with hands behind head squeezing elbows together then pushing away w/VC for correct movement and to  avoid pain.     Other Supine Exercises  Supine chest press w/dowel followed by supine chest press w/flexion using dowel with VC for correct movement and to avoid pain    Other Supine Exercises  Isometric ER/IR 10x10 seconds with demonstration/VC and tactile cueing for correct muscle activation w/o movement and to avoid a contraction hard enough to cause pain.       Shoulder Exercises: Seated   External Rotation  AROM;10 reps;Both    External Rotation Limitations  Demonstration and VC for scapular squeeze in order to improve mobility.     Other Seated Exercises  Seated trunk rotation for scapular stretch BIl 5x each with demonstration for correct movement.       Shoulder Exercises: Standing   Flexion  AAROM;Left;10 reps    Flexion Limitations  tactile cueing at the  trunk to prevent compensation with trunk bend, VC and demonstration for correct movement and to avoid pain; pt reports slight increase in pain with movement.       Manual Therapy   Manual Therapy  Joint mobilization;Soft tissue mobilization;Passive ROM    Joint Mobilization  Joint mobs inferior/posterior to the L glenohumeral joint 8x 8 sets grade II for pain and fluid exchange. Pt reports a good feeling with inferior mob and slight muscle soreness at hand placement for posterior mobs    Soft tissue mobilization  Palpable tightness/tenderness noted at the L deltoid, biceps; decreased minimally following light STM.     Passive ROM  PROM performed into flexion/abd with stretching at end range and myofascial release in the axilla and at the L lateral trunk wall where pt reports tightness with overhead flexion/abd to decrease pain and stress on the L glenohumeral joint             PT Education - 08/08/19 0955    Education Details  Pt will continue with shoulder ROM HEP at home.    Person(s) Educated  Patient    Methods  Explanation    Comprehension  Verbalized understanding          PT Long Term Goals - 08/06/19 1208      PT LONG TERM GOAL #1   Title  Patient will be independent with her HEP for improving shoulder ROM.    Time  4    Period  Weeks    Status  New    Target Date  09/03/19      PT LONG TERM GOAL #2   Title  Patient will improve left shoulder flexion AROM to be >/= 130 for increased ease reaching overhead.    Time  4    Period  Weeks    Status  New    Target Date  09/03/19      PT LONG TERM GOAL #3   Title  Patient will increase left shoulder abduction ROM to >/= 120 degrees to obtai radiation positioning.    Time  4    Period  Weeks    Status  New    Target Date  09/03/19      PT LONG TERM GOAL #4   Title  Patient will report she can reach behind her back to wash with >/= 25% less difficulty and pain.    Time  4    Period  Weeks    Status  New    Target  Date  09/03/19            Plan - 08/08/19 0086    Clinical  Impression Statement  Pt was able to tolerate an increase in AAROM activities overhead and AROM activities below 90 degrees this session w/o an increase in pain from baseline. Palpable tightness/tenderness noted at the L biceps/deltoid; decreased minimally following light STM. PROM was performed in flexion/abd, IR/ER with oscillations for relaxation and myofascial release along the axilla and L lateral trunk wall with improved ROM following release. Pt was educated to continue with current current HEP. Pt will benefit from continued POC.    Stability/Clinical Decision Making  Stable/Uncomplicated    Rehab Potential  Excellent    PT Frequency  2x / week    PT Duration  4 weeks    PT Treatment/Interventions  ADLs/Self Care Home Management;Therapeutic exercise;Patient/family education;Moist Heat;Therapeutic activities;Manual techniques;Joint Manipulations;Passive range of motion;Cryotherapy    PT Next Visit Plan  Begin PROM left shoulder; ROM exercises; heat/ice PRN    PT Home Exercise Plan  post op shoulder ROM HEP    Consulted and Agree with Plan of Care  Patient       Patient will benefit from skilled therapeutic intervention in order to improve the following deficits and impairments:  Postural dysfunction, Decreased knowledge of precautions, Impaired UE functional use, Pain, Decreased range of motion, Decreased strength  Visit Diagnosis: Malignant neoplasm of overlapping sites of left breast in female, estrogen receptor negative ([facility])  Abnormal posture  Stiffness of left shoulder, not elsewhere classified     Problem List Patient Active Problem List   Diagnosis Date Noted  . Family history of breast cancer   . Family history of throat cancer   . Family history of cervical cancer   . Malignant neoplasm of overlapping sites of left breast in female, estrogen receptor negative ([place]) 08/01/2019  . Family history of colon  cancer mom 11/05/2018  . Long term (current) use of anticoagulants 09/17/2017  . Encounter for therapeutic drug monitoring 11/21/2013  . Asthma with bronchitis 09/01/2013  . Chest pain 01/01/2013  . Aortic insufficiency 10/25/2011  . Chronic anticoagulation 09/29/2011  . Nocturnal dyspnea 09/29/2011  . DVT (deep venous thrombosis) ([place]) 11/17/2010  . VITAMIN D DEFICIENCY 08/06/2009  . DVT 01/26/2009  . KNEE PAIN, RIGHT 06/19/2008  . EDEMA 06/19/2008  . ANKLE PAIN, LEFT 02/13/2008  . HYPERKALEMIA 08/20/2007  . HYPOKALEMIA 08/20/2007  . HYPERLIPIDEMIA 07/18/2007  . Cardiomyopathy, hypertensive ([place]) 07/18/2007  . Essential hypertension 06/26/2007  . DISEASE, HYPERTENSIVE HEART NOS W/HF 06/26/2007  . Congestive heart failure ([place]) 06/26/2007  . Allergic rhinitis, cause unspecified 06/26/2007  . ASTHMA 06/26/2007  . SYMPTOM, SYNCOPE AND COLLAPSE 06/26/2007  . HEADACHE 06/26/2007  . HEART MURMUR, HX OF 06/26/2007  . DVT, HX OF 06/26/2007    Ander Purpura, PT 08/08/2019, 9:56 AM  [place], [place], [address] Phone: [phone]   Fax:  [phone]  Name: Veona Bittman MRN: [id number] Date of Birth: [birth year]

## 2019-08-09 ENCOUNTER — Other Ambulatory Visit: Payer: Self-pay | Admitting: Cardiology

## 2019-08-12 ENCOUNTER — Ambulatory Visit (HOSPITAL_COMMUNITY)
Admission: RE | Admit: 2019-08-12 | Discharge: 2019-08-12 | Disposition: A | Discharge status: Home / Self Care | Payer: Medicare Other | Source: Ambulatory Visit | Attending: Hematology and Oncology | Admitting: Hematology and Oncology

## 2019-08-12 ENCOUNTER — Encounter: Payer: Self-pay | Admitting: *Deleted

## 2019-08-12 ENCOUNTER — Other Ambulatory Visit: Payer: Self-pay

## 2019-08-12 ENCOUNTER — Ambulatory Visit: Payer: Medicare Other

## 2019-08-12 DIAGNOSIS — C50512 Malignant neoplasm of lower-outer quadrant of left female breast: Secondary | ICD-10-CM | POA: Diagnosis not present

## 2019-08-12 DIAGNOSIS — C773 Secondary and unspecified malignant neoplasm of axilla and upper limb lymph nodes: Secondary | ICD-10-CM | POA: Diagnosis not present

## 2019-08-12 DIAGNOSIS — Z171 Estrogen receptor negative status [ER-]: Secondary | ICD-10-CM | POA: Insufficient documentation

## 2019-08-12 DIAGNOSIS — R293 Abnormal posture: Secondary | ICD-10-CM

## 2019-08-12 DIAGNOSIS — C50812 Malignant neoplasm of overlapping sites of left female breast: Secondary | ICD-10-CM

## 2019-08-12 DIAGNOSIS — M25612 Stiffness of left shoulder, not elsewhere classified: Secondary | ICD-10-CM | POA: Diagnosis not present

## 2019-08-12 DIAGNOSIS — C50919 Malignant neoplasm of unspecified site of unspecified female breast: Secondary | ICD-10-CM | POA: Diagnosis not present

## 2019-08-12 DIAGNOSIS — C50912 Malignant neoplasm of unspecified site of left female breast: Secondary | ICD-10-CM | POA: Diagnosis not present

## 2019-08-12 DIAGNOSIS — N289 Disorder of kidney and ureter, unspecified: Secondary | ICD-10-CM | POA: Diagnosis not present

## 2019-08-12 IMAGING — MR MR BREAST BILAT WO/W CM
4 of 12 series · 17 of 48 positions shown · IV contrast (Yes)
Comparison: Recent bilateral diagnostic mammogram, left breast
ultrasound, and image guided biopsies of the left breast and left
axilla performed at the [REDACTED].

CLINICAL DATA: 68-year-old patient recently diagnosed with grade 3
invasive ductal carcinoma at 2 sites in the left breast, [DATE]
position and 1 o'clock position, and metastasis to an abnormal left
axillary lymph node. MRI is being performed prior to neoadjuvant
chemotherapy.

LABS:  None obtained
EXAM:
BILATERAL BREAST MRI WITH AND WITHOUT CONTRAST
TECHNIQUE: Multiplanar, multisequence MR images of both breasts were obtained
prior to and following the intravenous administration of 10 ml of
Gadavist

[Series 4: T1 · axial · non-contrast · 1.8mm · 0.70mm/px · z∈[-90,+125]mm · 5 of 240 slices shown]
[im 1/240]
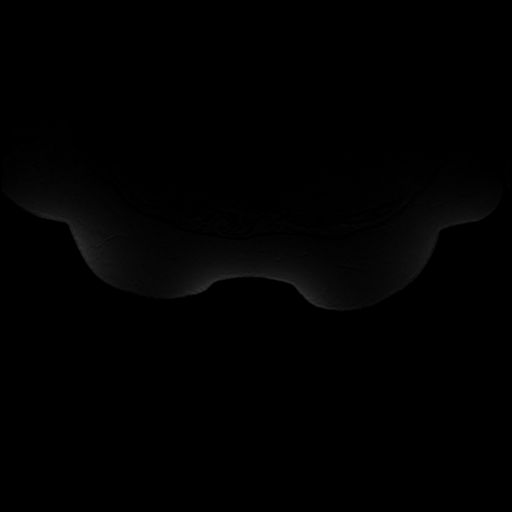
[im 60/240]
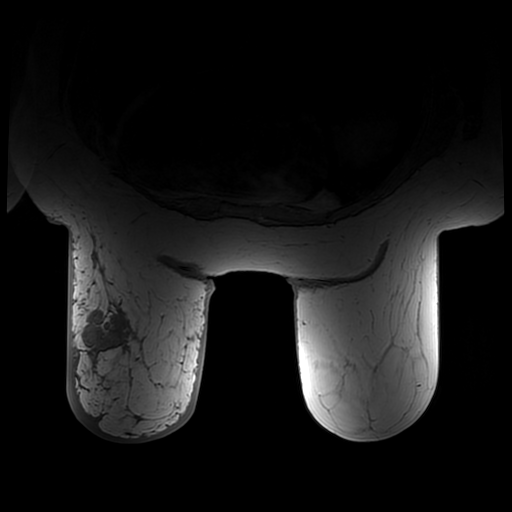
[im 120/240]
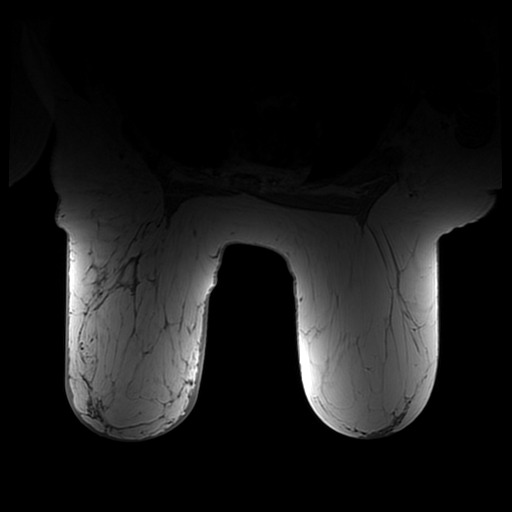
[im 180/240]
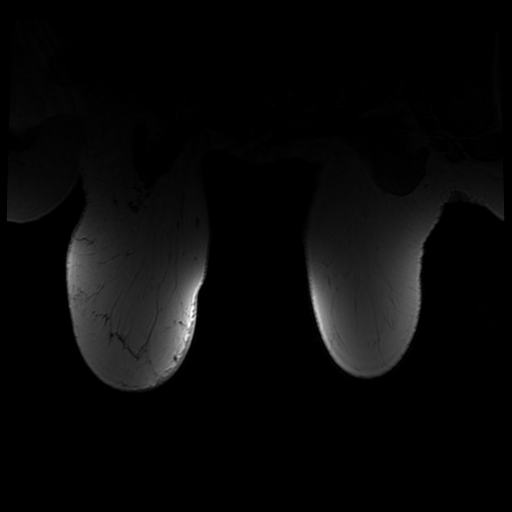
[im 240/240]
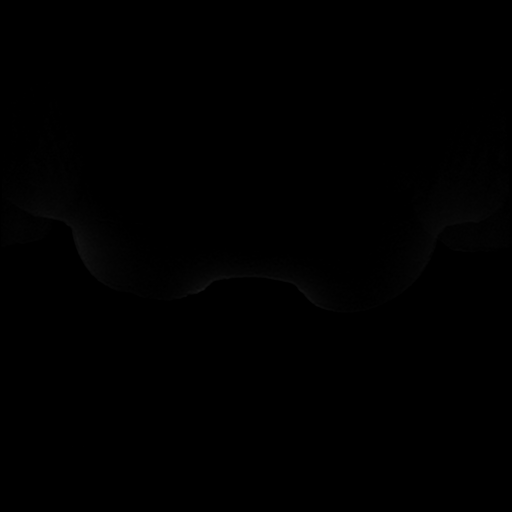

[Series 5: T2 · axial · 3.0mm · 0.70mm/px · 1 of 75 slices shown]
[im 1/75]
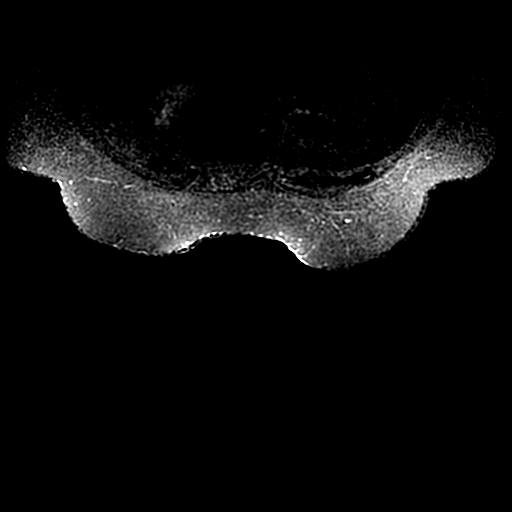

[Series 700: T1 fat-sat · axial · 1.8mm · 0.70mm/px · z∈[-104,+115]mm · 6 of 244 slices shown (1 of 2)]
[im 1/244]
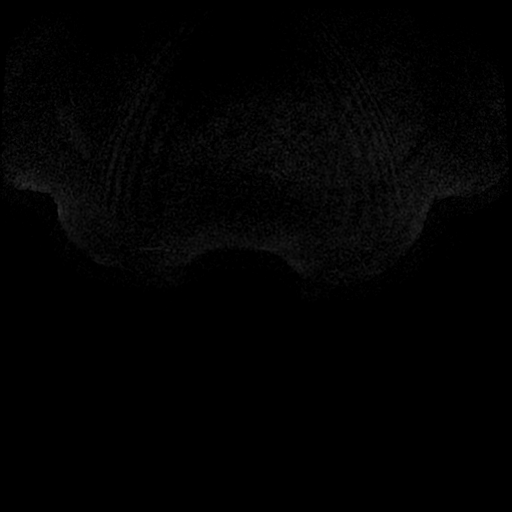
[im 49/244]
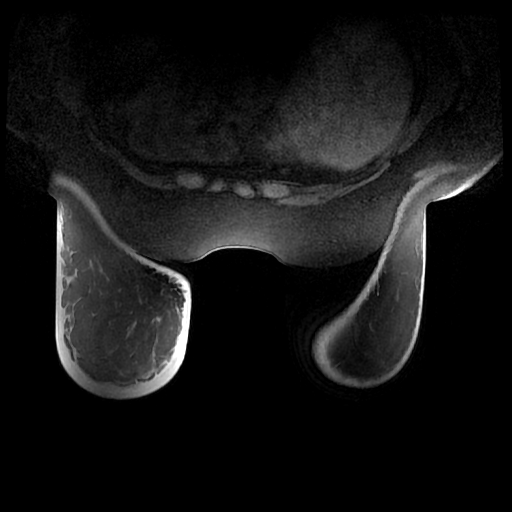
[im 98/244]
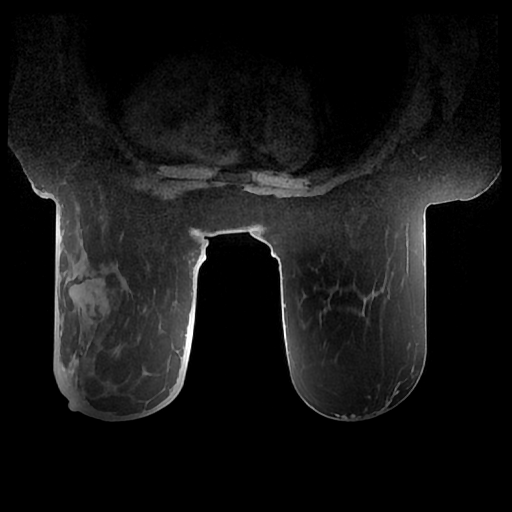
[im 146/244]
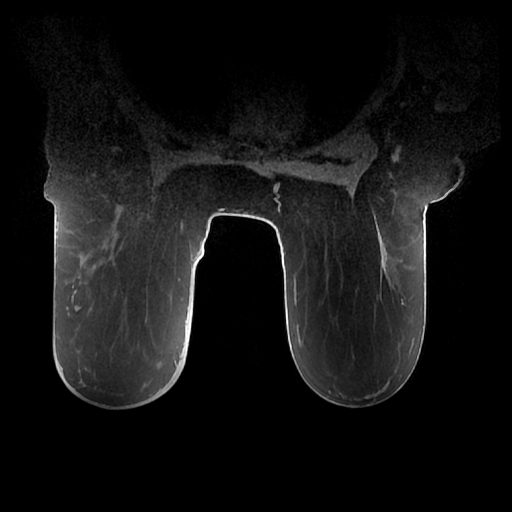
[im 195/244]
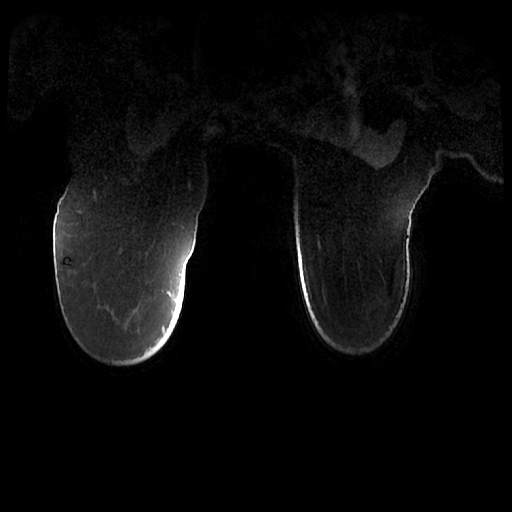
[im 244/244]
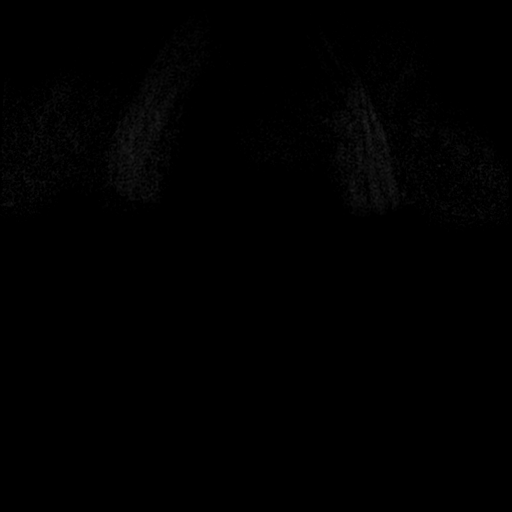

[Series 701: T1 fat-sat · axial · 1.8mm · 0.70mm/px · z∈[-104,+115]mm · 5 of 243 slices shown (2 of 2)]
[im 1/243]
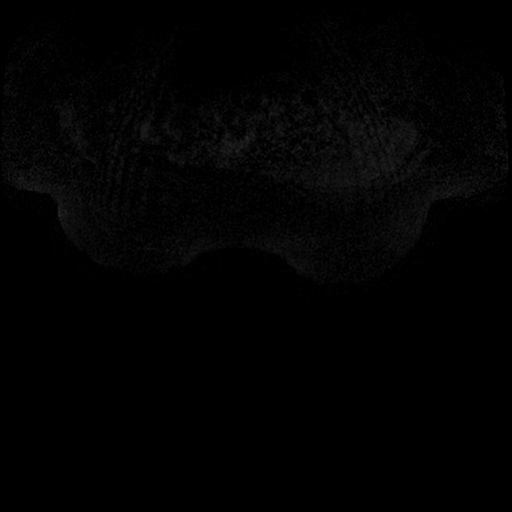
[im 49/243]
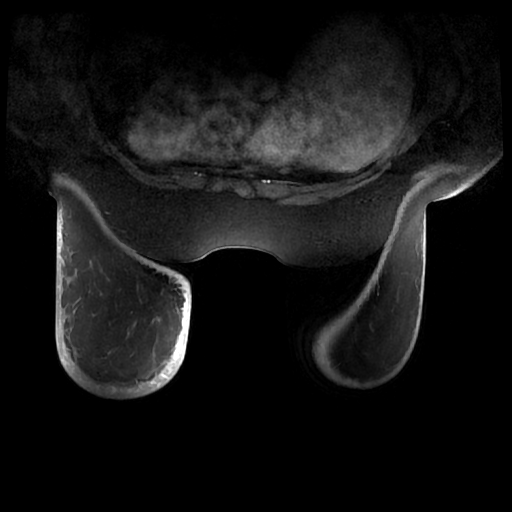
[im 97/243]
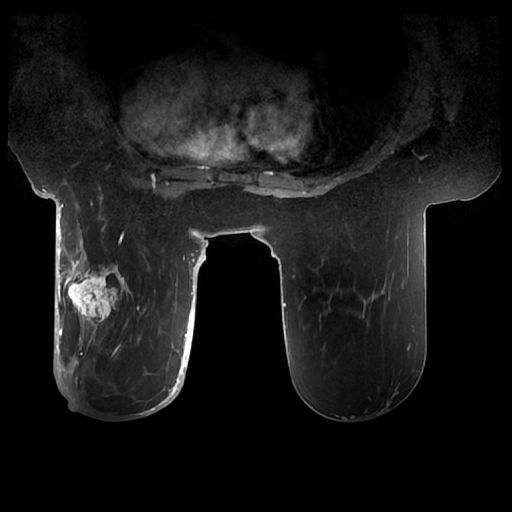
[im 146/243]
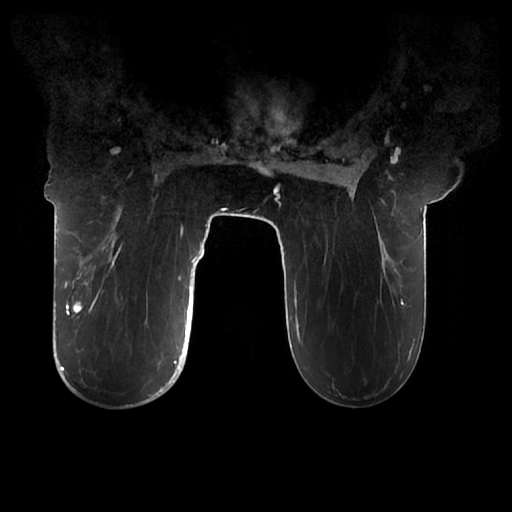
[im 243/243]
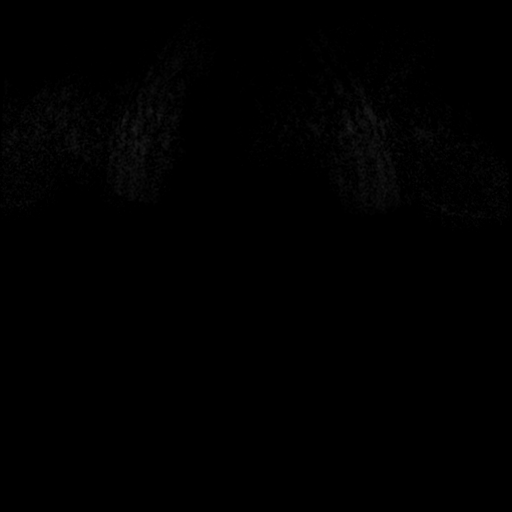

[17 of 48 positions shown; findings below may reference images not displayed]

Three-dimensional MR images were rendered by post-processing of the
original MR data on an independent workstation. The
three-dimensional MR images were interpreted, and findings are
reported in the following complete MRI report for this study. Three
dimensional images were evaluated at the independent [employer]
workstation
FINDINGS: Breast composition: b. Scattered fibroglandular tissue.

Background parenchymal enhancement: Mild

Right breast: No mass or abnormal enhancement.

Left breast: Diffuse thickening, increased T2 signal, and
enhancement of the skin of the left breast. Skin thickening measures
up to 3.5 mm. There are scattered areas of increased T2 signal
throughout the left breast.

Previously biopsied dominant mass in the middle third of the 3-4
o'clock region of the left breast contains internal clip artifact,
is irregular in shape, and enhances heterogeneously with washout
kinetics. This mass measures 3.5 x 4.0 x 3.1 cm. This mass extends
into the lower outer quadrant. Superior to the dominant mass in the
outer left breast are multiple slightly irregular intensely
enhancing masses in the upper-outer quadrant. A superficially
positioned mass in the upper-outer quadrant contains internal biopsy
clip artifact and correlates with the second biopsied mass showing
invasive ductal carcinoma, at 1 o'clock position 11 cm from the
nipple. This biopsied mass measures 1.1 x 0.6 cm axial dimensions.
No additional masses are seen inferior to the dominant biopsied
mass.

Negative for abnormal enhancement of the pectoralis muscle.

Lymph nodes: In the axillary tail/inferior left axilla are 5 to 6
linearly arranged and intensely enhancing irregular masses favored
to be pathologic level I lymph nodes. The largest and one of the
most posterior of these axillary masses contains central biopsy clip
artifact and corresponds to the biopsied axillary lymph node
demonstrating metastasis at pathology. The biopsied lymph node
measures 2.1 x 1.9 cm axial dimensions. No level II or level III
pathologic lymph nodes are identified.

Ancillary findings:  None.
IMPRESSION: Locally advanced breast cancer with one dominant mass in the 3-4
o'clock position and multiple irregular enhancing masses in the
upper-outer quadrant, diffuse skin thickening and enhancement,
parenchymal edema, and multiple pathologic level I axillary lymph
nodes.

No MRI evidence of malignancy in the right breast.

RECOMMENDATION:
Treatment plan for known left breast cancer with axillary
metastasis.

BI-RADS CATEGORY  6: Known biopsy-proven malignancy.

## 2019-08-12 IMAGING — CT CT ABD-PELV W/ CM
2 of 5 series · 12 of 36 positions shown, 15 images · IV contrast (OMNIPAQUE)
Comparison: CTA chest [DATE]

CLINICAL DATA: Breast cancer.  Staging.

EXAM:
CT CHEST, ABDOMEN, AND PELVIS WITH CONTRAST
TECHNIQUE: Multidetector CT imaging of the chest, abdomen and pelvis was
performed following the standard protocol during bolus
administration of intravenous contrast.
CONTRAST:  80mL OMNIPAQUE IOHEXOL 300 MG/ML  SOLN

[Series 2: cap with · axial · 0.80mm/px · z∈[-454,+46]mm · 9 of 124 slices shown, 12 images]
[im 12/124  mediastinal]
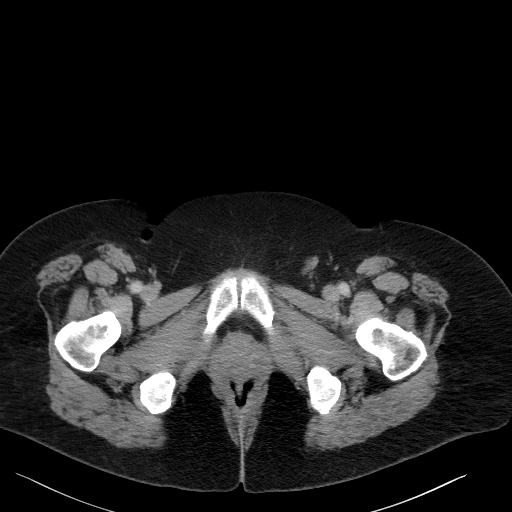
[im 12/124  lung]
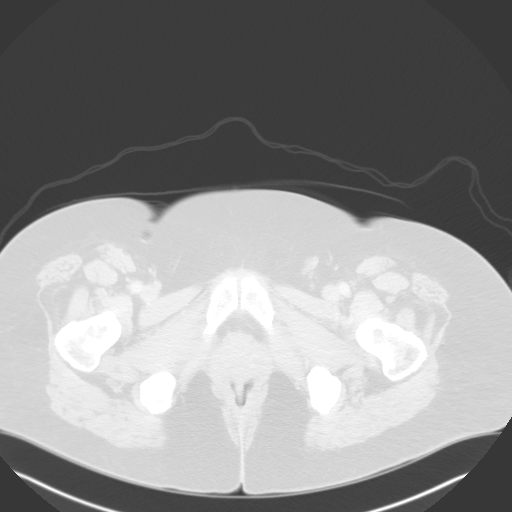
[im 23/124  lung]
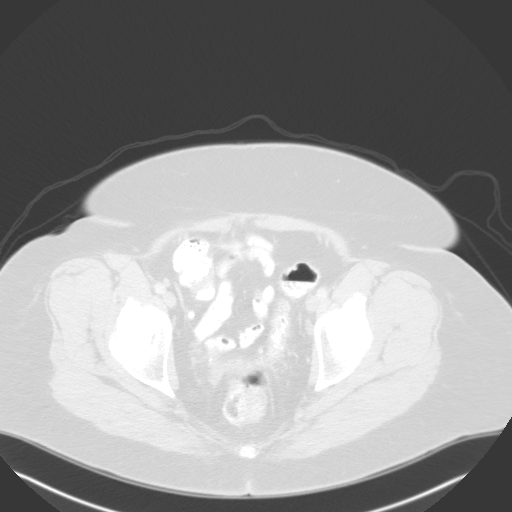
[im 34/124  lung]
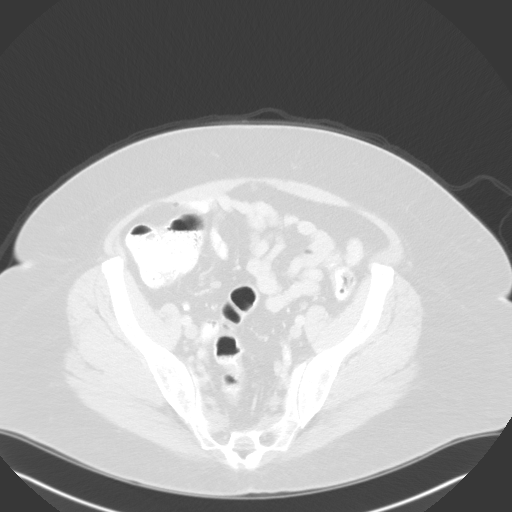
[im 45/124  lung]
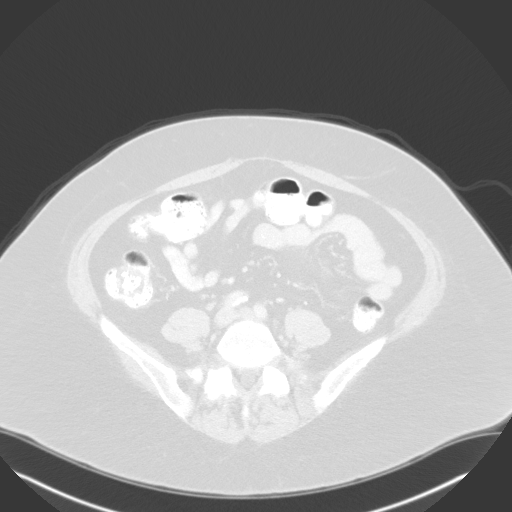
[im 68/124  mediastinal]
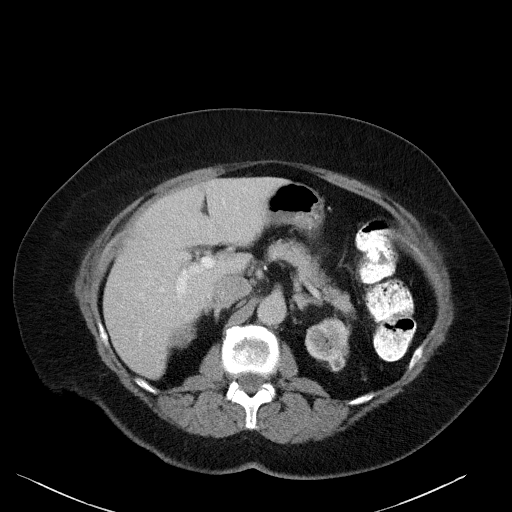
[im 68/124  lung]
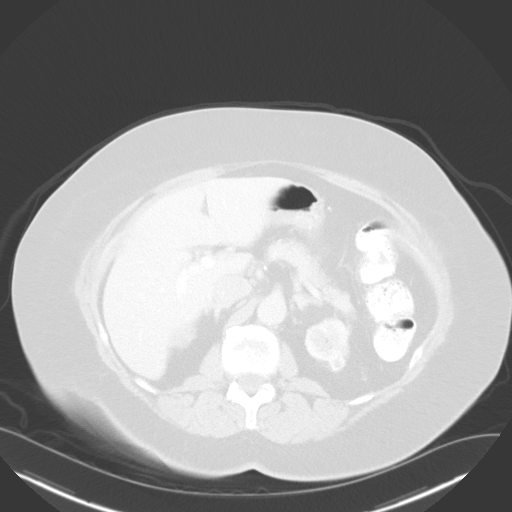
[im 79/124  lung]
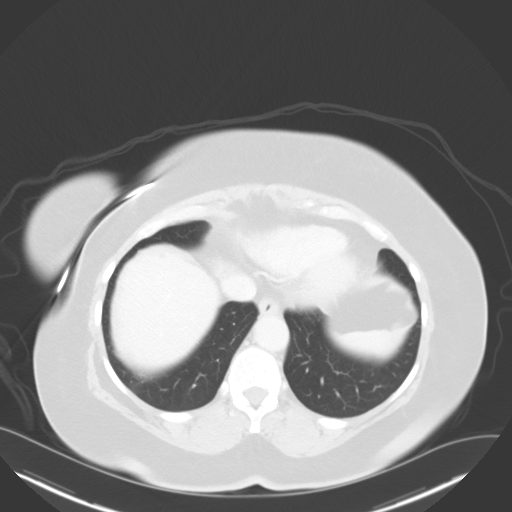
[im 90/124  lung]
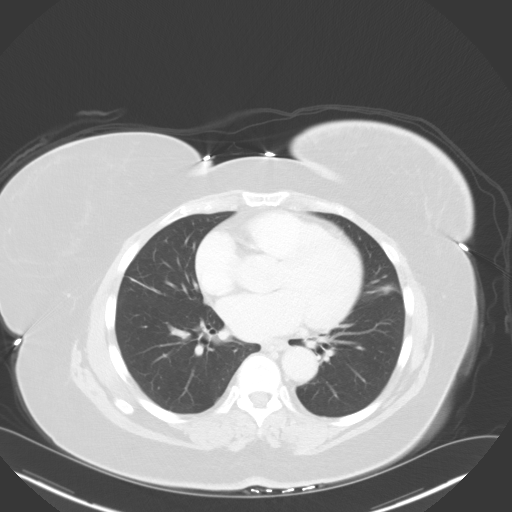
[im 101/124  lung]
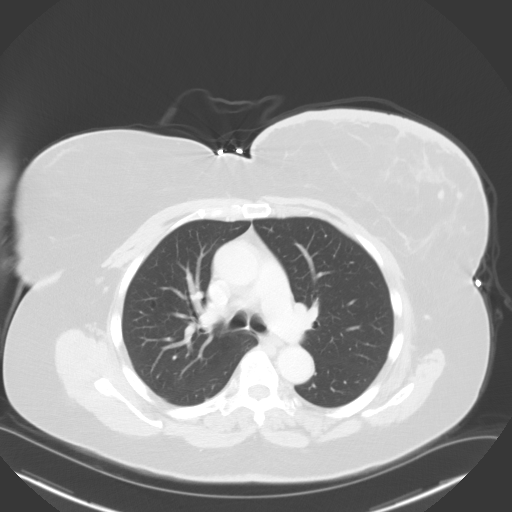
[im 112/124  mediastinal]
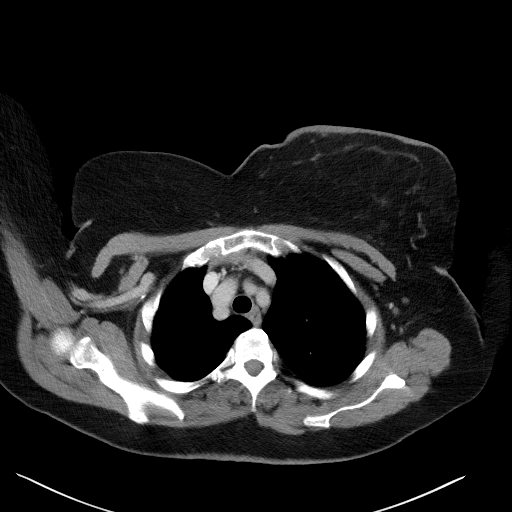
[im 112/124  lung]
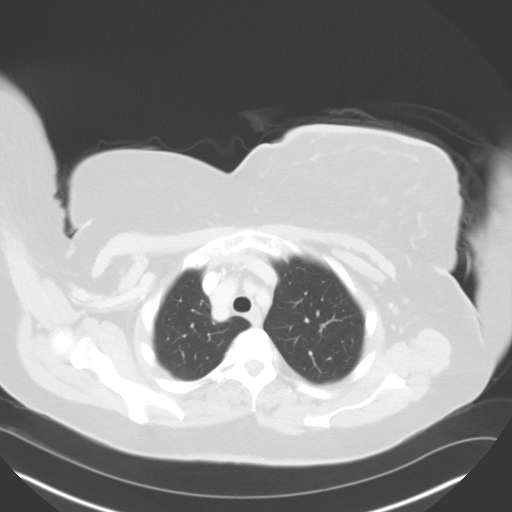

[Series 4: coronals · coronal · 0.89mm/px · 3 of 126 slices shown]
[im 26/126  lung]
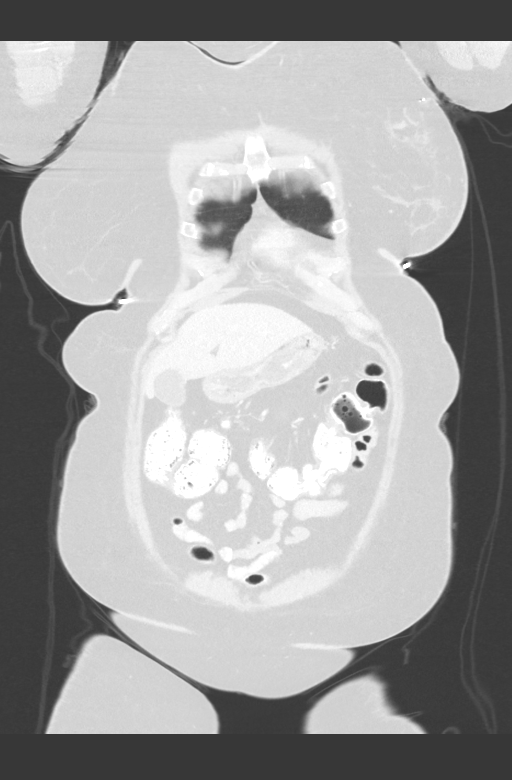
[im 51/126  lung]
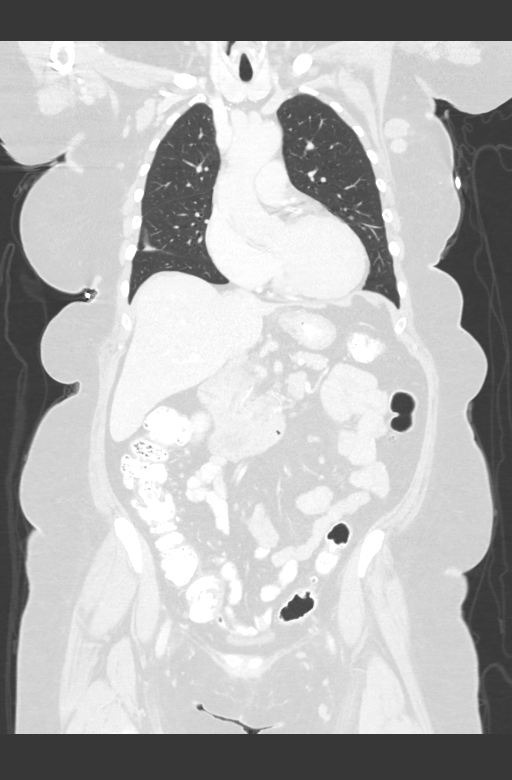
[im 76/126  lung]
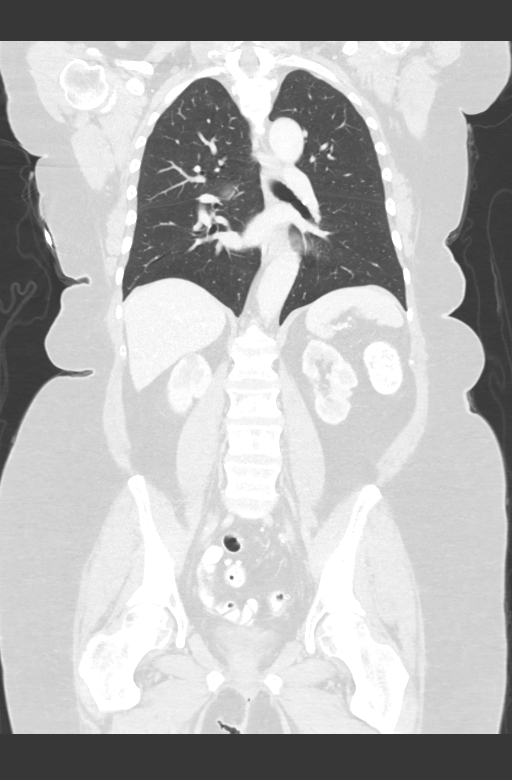

[12 of 36 positions shown; findings below may reference images not displayed]

FINDINGS: CT CHEST FINDINGS

Cardiovascular: The heart size is normal. No substantial pericardial
effusion. Coronary artery calcification is evident. Atherosclerotic
calcification is noted in the wall of the thoracic aorta.

Mediastinum/Nodes: No mediastinal lymphadenopathy. There is no hilar
lymphadenopathy. No evidence for internal mammary lymphadenopathy.
The esophagus has normal imaging features. No right axillary
adenopathy. 13 mm enlarged left axillary node visible on [DATE]. 17 mm
short axis posterior left breast nodule or anterior axillary node
has a probable localization clip.

Lungs/Pleura: Subsegmental atelectasis noted posterior right middle
lobe. No suspicious pulmonary nodule or mass. There is some
atelectasis in the lingula. 3 mm perifissural nodule in the left
lower lobe (63/6) is likely a subpleural lymph node.

Musculoskeletal: Skin thickening noted left breast. 3.9 cm
heterogeneously enhancing lesion is identified in the lower outer
quadrant of the left breast. No worrisome lytic or sclerotic osseous
abnormality.

CT ABDOMEN PELVIS FINDINGS

Hepatobiliary: 4 mm subcapsular low-density lesion identified in the
anterior hepatic dome (axial 46/series 2). Otherwise no focal lesion
evident within the liver parenchyma. There is no evidence for
gallstones, gallbladder wall thickening, or pericholecystic fluid.
No intrahepatic or extrahepatic biliary dilation.

Pancreas: No focal mass lesion. No dilatation of the main duct. No
intraparenchymal cyst. No peripancreatic edema.

Spleen: No splenomegaly. No focal mass lesion.

Adrenals/Urinary Tract: No adrenal nodule or mass. Cortical scarring
noted in the kidneys bilaterally with multiple tiny subcapsular
hypoattenuating lesions in each kidney, likely cysts. 10 mm
exophytic lesion upper pole left kidney (axial 57/2 and sagittal
109/5) shows peripheral enhancement which becomes more diffuse on
delayed imaging. No evidence for hydroureter. The urinary bladder
appears normal for the degree of distention.

Stomach/Bowel: Stomach is unremarkable. No gastric wall thickening.
No evidence of outlet obstruction. Duodenum is normally positioned
as is the ligament of Treitz. No small bowel wall thickening. No
small bowel dilatation. The terminal ileum is normal. The appendix
is normal. No gross colonic mass. No colonic wall thickening.
Diverticular changes are noted in the left colon without evidence of
diverticulitis.

Vascular/Lymphatic: There is abdominal aortic atherosclerosis
without aneurysm. Portal vein and superior mesenteric vein are
patent. No gastrohepatic or hepato duodenal ligament
lymphadenopathy. Scattered small retroperitoneal and central
mesenteric lymph nodes are evident. No pelvic sidewall
lymphadenopathy. Prominent groin lymph nodes bilaterally are likely
reactive.

Reproductive: Uterus surgically absent.  There is no adnexal mass.

Other: No intraperitoneal free fluid.

Musculoskeletal: No worrisome lytic or sclerotic osseous
abnormality. Mild superior endplate compression deformity noted at
L2.
IMPRESSION: 1. 3.9 cm heterogeneously enhancing inferior left breast mass with
associated left axillary lymphadenopathy. No other evidence for
definite metastatic disease in the chest, abdomen, or pelvis.
2. 10 mm exophytic enhancing lesion identified in the upper pole the
left kidney, highly suspicious for small renal cell carcinoma. MRI
of the abdomen without and with contrast recommended to further
evaluate.
3. 3 mm perifissural nodule left lower lobe, likely subpleural lymph
node. Attention on follow-up recommended.
4. 4 mm subcapsular hypoattenuating lesion in the anterior hepatic
dome, too small to characterize. While likely benign, close
attention on follow-up recommended.
5. Tiny hypoattenuating cortical lesions in both kidneys, likely
cyst.
6.  Aortic Atherosclerois ([FM]-170.0)

## 2019-08-12 IMAGING — CT CT CHEST W/ CM
2 of 5 series · 12 of 36 positions shown, 15 images · IV contrast (omnipaque)
Comparison: CTA chest [DATE]

CLINICAL DATA: Breast cancer.  Staging.

EXAM:
CT CHEST, ABDOMEN, AND PELVIS WITH CONTRAST
TECHNIQUE: Multidetector CT imaging of the chest, abdomen and pelvis was
performed following the standard protocol during bolus
administration of intravenous contrast.
CONTRAST:  80mL OMNIPAQUE IOHEXOL 300 MG/ML  SOLN

[Series 2: cap with · axial · 0.80mm/px · z∈[-454,+46]mm · 9 of 124 slices shown, 12 images]
[im 12/124  mediastinal]
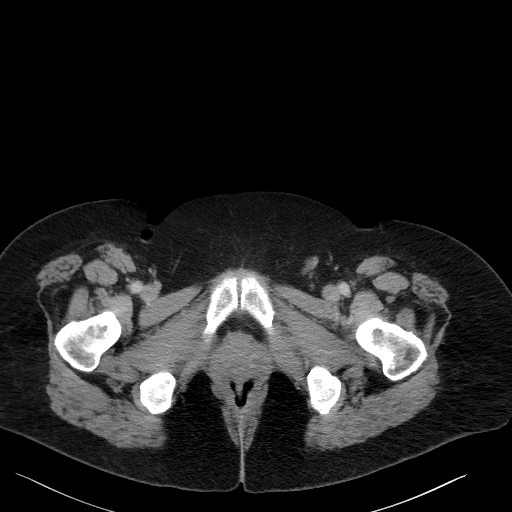
[im 12/124  lung]
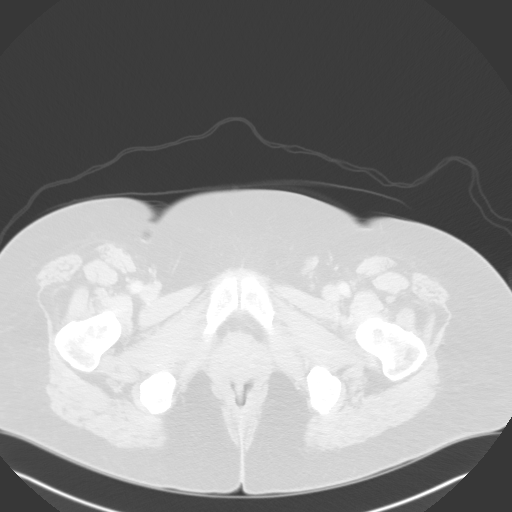
[im 23/124  lung]
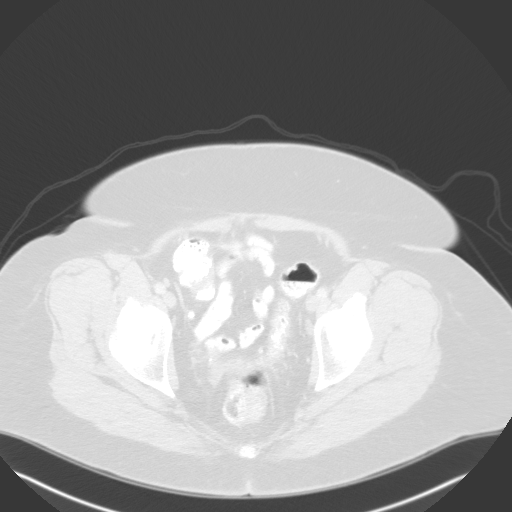
[im 34/124  lung]
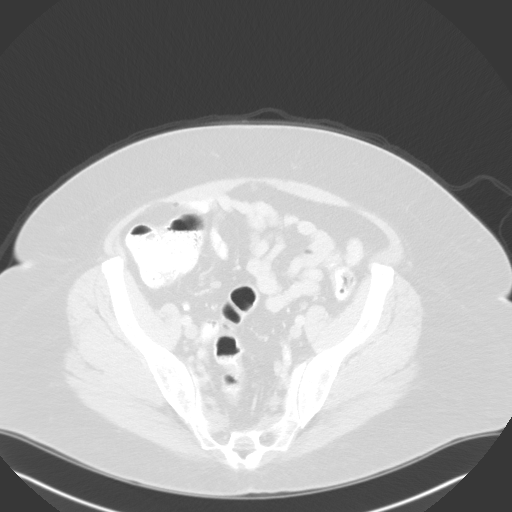
[im 45/124  lung]
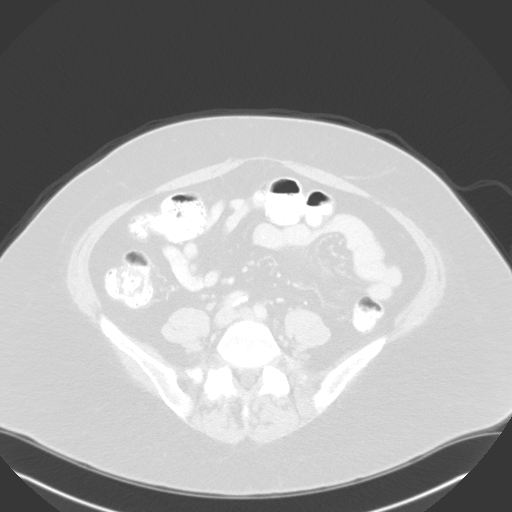
[im 68/124  mediastinal]
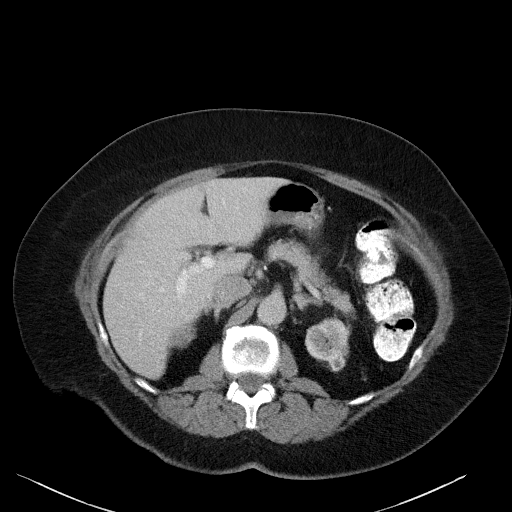
[im 68/124  lung]
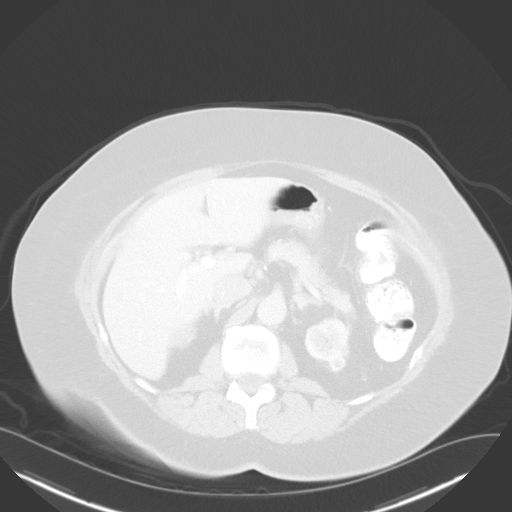
[im 79/124  lung]
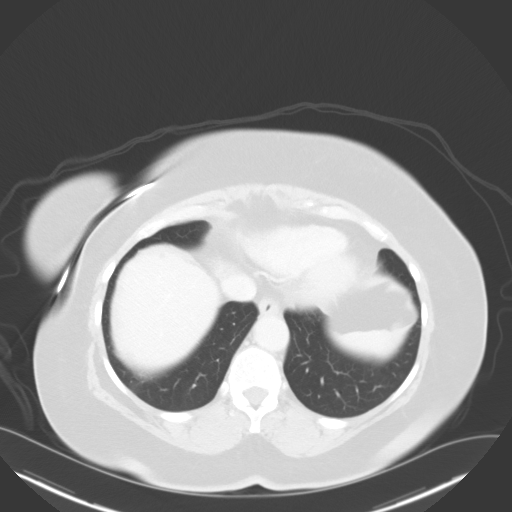
[im 90/124  lung]
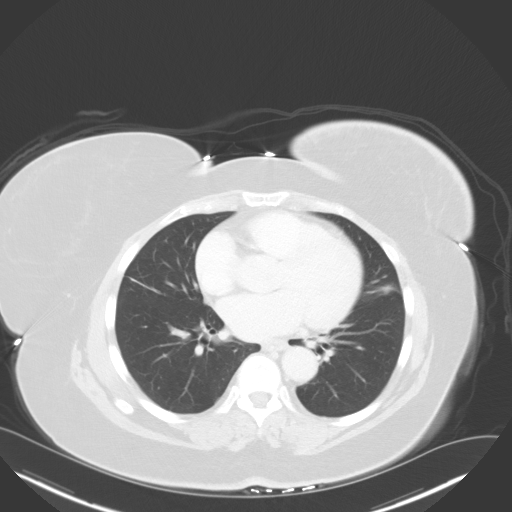
[im 101/124  lung]
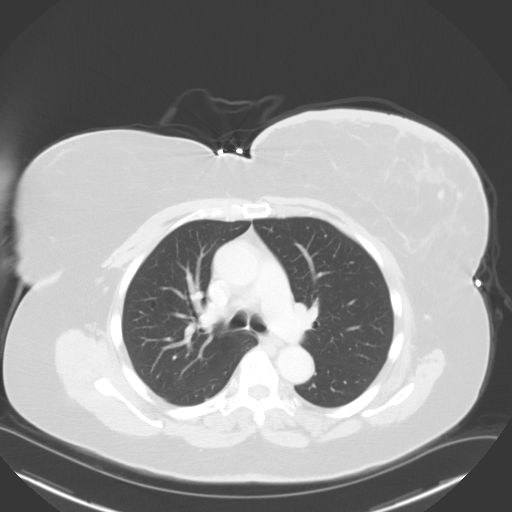
[im 112/124  mediastinal]
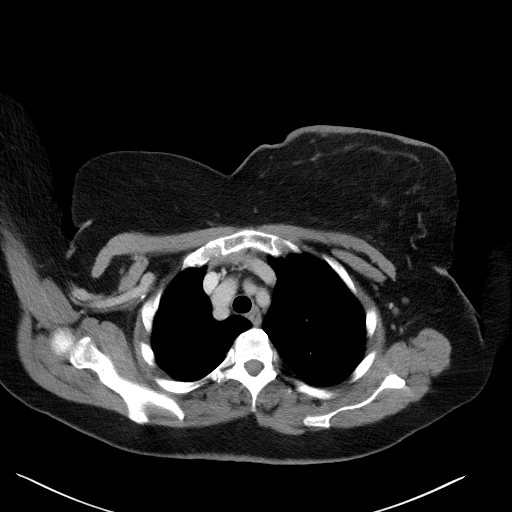
[im 112/124  lung]
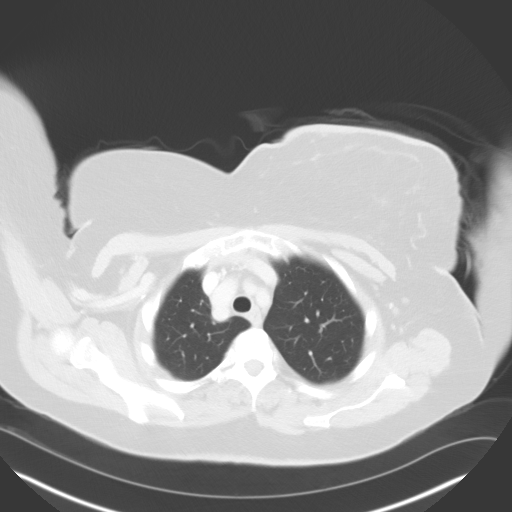

[Series 4: coronals · coronal · 0.89mm/px · 3 of 126 slices shown]
[im 26/126  lung]
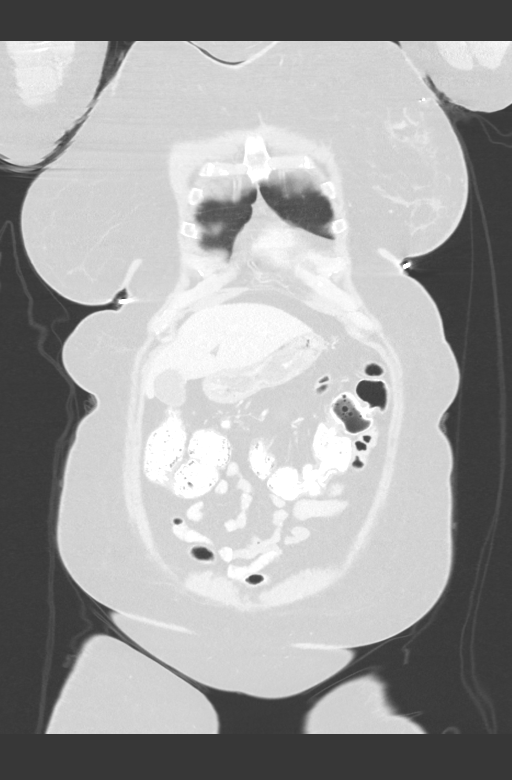
[im 51/126  lung]
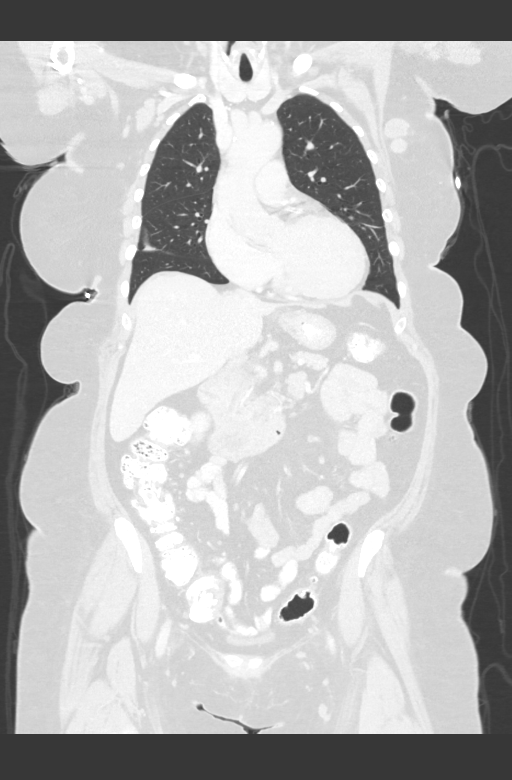
[im 76/126  lung]
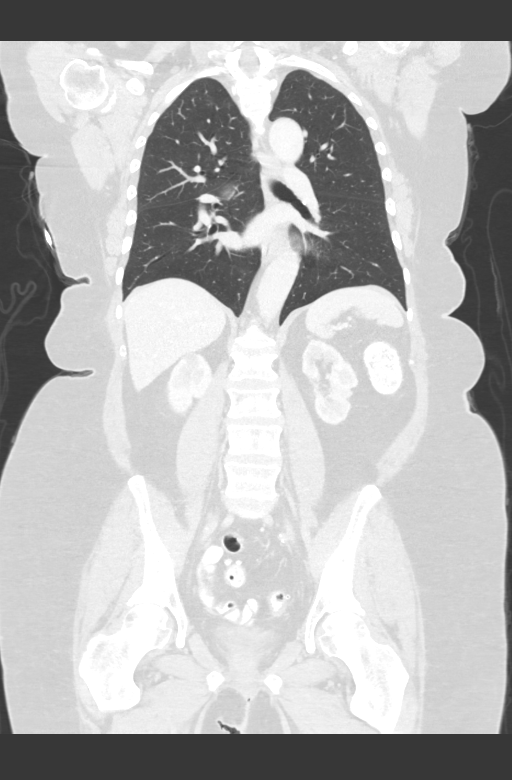

[12 of 36 positions shown; findings below may reference images not displayed]

FINDINGS: CT CHEST FINDINGS

Cardiovascular: The heart size is normal. No substantial pericardial
effusion. Coronary artery calcification is evident. Atherosclerotic
calcification is noted in the wall of the thoracic aorta.

Mediastinum/Nodes: No mediastinal lymphadenopathy. There is no hilar
lymphadenopathy. No evidence for internal mammary lymphadenopathy.
The esophagus has normal imaging features. No right axillary
adenopathy. 13 mm enlarged left axillary node visible on [DATE]. 17 mm
short axis posterior left breast nodule or anterior axillary node
has a probable localization clip.

Lungs/Pleura: Subsegmental atelectasis noted posterior right middle
lobe. No suspicious pulmonary nodule or mass. There is some
atelectasis in the lingula. 3 mm perifissural nodule in the left
lower lobe (63/6) is likely a subpleural lymph node.

Musculoskeletal: Skin thickening noted left breast. 3.9 cm
heterogeneously enhancing lesion is identified in the lower outer
quadrant of the left breast. No worrisome lytic or sclerotic osseous
abnormality.

CT ABDOMEN PELVIS FINDINGS

Hepatobiliary: 4 mm subcapsular low-density lesion identified in the
anterior hepatic dome (axial 46/series 2). Otherwise no focal lesion
evident within the liver parenchyma. There is no evidence for
gallstones, gallbladder wall thickening, or pericholecystic fluid.
No intrahepatic or extrahepatic biliary dilation.

Pancreas: No focal mass lesion. No dilatation of the main duct. No
intraparenchymal cyst. No peripancreatic edema.

Spleen: No splenomegaly. No focal mass lesion.

Adrenals/Urinary Tract: No adrenal nodule or mass. Cortical scarring
noted in the kidneys bilaterally with multiple tiny subcapsular
hypoattenuating lesions in each kidney, likely cysts. 10 mm
exophytic lesion upper pole left kidney (axial 57/2 and sagittal
109/5) shows peripheral enhancement which becomes more diffuse on
delayed imaging. No evidence for hydroureter. The urinary bladder
appears normal for the degree of distention.

Stomach/Bowel: Stomach is unremarkable. No gastric wall thickening.
No evidence of outlet obstruction. Duodenum is normally positioned
as is the ligament of Treitz. No small bowel wall thickening. No
small bowel dilatation. The terminal ileum is normal. The appendix
is normal. No gross colonic mass. No colonic wall thickening.
Diverticular changes are noted in the left colon without evidence of
diverticulitis.

Vascular/Lymphatic: There is abdominal aortic atherosclerosis
without aneurysm. Portal vein and superior mesenteric vein are
patent. No gastrohepatic or hepato duodenal ligament
lymphadenopathy. Scattered small retroperitoneal and central
mesenteric lymph nodes are evident. No pelvic sidewall
lymphadenopathy. Prominent groin lymph nodes bilaterally are likely
reactive.

Reproductive: Uterus surgically absent.  There is no adnexal mass.

Other: No intraperitoneal free fluid.

Musculoskeletal: No worrisome lytic or sclerotic osseous
abnormality. Mild superior endplate compression deformity noted at
L2.
IMPRESSION: 1. 3.9 cm heterogeneously enhancing inferior left breast mass with
associated left axillary lymphadenopathy. No other evidence for
definite metastatic disease in the chest, abdomen, or pelvis.
2. 10 mm exophytic enhancing lesion identified in the upper pole the
left kidney, highly suspicious for small renal cell carcinoma. MRI
of the abdomen without and with contrast recommended to further
evaluate.
3. 3 mm perifissural nodule left lower lobe, likely subpleural lymph
node. Attention on follow-up recommended.
4. 4 mm subcapsular hypoattenuating lesion in the anterior hepatic
dome, too small to characterize. While likely benign, close
attention on follow-up recommended.
5. Tiny hypoattenuating cortical lesions in both kidneys, likely
cyst.
6.  Aortic Atherosclerois ([FM]-170.0)

## 2019-08-12 MED ORDER — IOHEXOL 300 MG/ML  SOLN
100.0000 mL | Freq: Once | INTRAMUSCULAR | Status: AC | PRN
  Administered 2019-08-12: 80 mL via INTRAVENOUS

## 2019-08-12 MED ORDER — SODIUM CHLORIDE (PF) 0.9 % IJ SOLN
INTRAMUSCULAR | Status: AC
  Filled 2019-08-12: qty 50

## 2019-08-12 MED ORDER — GADOBUTROL 1 MMOL/ML IV SOLN
10.0000 mL | Freq: Once | INTRAVENOUS | Status: AC | PRN
  Administered 2019-08-12: 17:00:00 10 mL via INTRAVENOUS

## 2019-08-12 NOTE — Therapy (Signed)
[place], [place], [address] Phone: [phone]   Fax:  [phone]  Physical Therapy Treatment  Patient Details  Name: [name] Foster MRN: [id number] Date of Birth: 1951-08-29 Referring Provider (PT): Dr. Stark Klein   Encounter Date: 08/12/2019  PT End of Session - 08/12/19 0903    Visit Number  3    Number of Visits  8    Date for PT Re-Evaluation  09/03/19    PT Start Time  0900    PT Stop Time  (S) 0954    PT Time Calculation (min)  54 min    Activity Tolerance  Patient tolerated treatment well    Behavior During Therapy  [facility] for tasks assessed/performed       Past Medical History:  Diagnosis Date  . Allergic rhinitis   . Aortic valve disorders   . Asthma   . Family history of breast cancer   . Family history of cervical cancer   . Family history of colon cancer   . Family history of throat cancer   . Hyperlipidemia   . Hypertension   . Long term (current) use of anticoagulants   . Other primary cardiomyopathies   . Personal history of colonic polyps   . Personal history of venous thrombosis and embolism     Past Surgical History:  Procedure Laterality Date  . ABDOMINAL HYSTERECTOMY    . CARDIAC CATHETERIZATION    . DILATION AND CURETTAGE OF UTERUS     several  . Rt knee arthoscopic    . TEE WITHOUT CARDIOVERSION N/A 02/21/2013   Procedure: TRANSESOPHAGEAL ECHOCARDIOGRAM (TEE);  Surgeon: Lelon Perla, MD;  Location: [facility] ENDOSCOPY;  Service: Cardiovascular;  Laterality: N/A;    There were no vitals filed for this visit.  Subjective Assessment - 08/12/19 0903    Subjective  Pt states that she has been continueing to perform her exercises at home. She states she has no pain in her shoulder at this time.    Pertinent History  Patient was diagnosed on 07/24/2019 with left grade III invasive ductal carcinoma breast cancer. There are 2 masses in the left breast: one measures 3.6  cm in the lower outer quadrant and the other measures 4 mm in the upper outer quadrant. They are ER/PR negative and HER2 positive with a Ki67 of 70%. She has 4 abnormal appearing axillary lymph nodes. One was biopsied and found to be positive. She had a right rotator cuff repair in 07/2016 and has had left shoulder pain since that time. She has never had her left shoulder ROM limitation or pain assessed by a physician.    Patient Stated Goals  Reduce lymphedema risk and learn post op shoulder ROM HEP; improve left shoulder ROM pre-operatively to allow for better surgical outcomes and radiation positioning    Currently in Pain?  No/denies                       [facility] Adult PT Treatment/Exercise - 08/12/19 0001      Shoulder Exercises: Supine   Horizontal ABduction  AROM;Both;10 reps    Horizontal ABduction Limitations  VC and demonstration for correct movement. VC to get light stretching Bil    External Rotation  AROM;Both;10 reps    External Rotation Limitations  supine with hands behind head squeezing elbows together then pushing away w/VC for correct movement and to avoid pain.     Other Supine Exercises  Supine  chest press w/dowel followed by supine chest press w/flexion using dowel with good movement throughout improving since her last session. VC to avoid pain with flexion      Shoulder Exercises: Seated   External Rotation  AROM;10 reps;Both    External Rotation Limitations  Bil/elbows at 90 degrees, VC for scapular squeeze and correct movement.     Other Seated Exercises  Seated trunk rotation for scapular stretch BIl 10x each with demonstration for correct movement.     Other Seated Exercises  Shoulder isometric contraction with VC to just activate the muscle then hold without using all of her strength into flexion, extension, IR, ER avoiding painful contractions 10x10 seconds      Shoulder Exercises: Standing   Flexion  AAROM;Left;10 reps    Flexion Limitations  wall  wash, 1x tactile cueing to prevent R trunk bend compensation. VC to avoid pain. Pt reports less pain than last time.     Other Standing Exercises  low load, high repetition, small amplitude shoulder stability/proprioceptive exercise into shoulder adduction, extension, depression on the L 30x with VC, demonstration for correct movement.       Manual Therapy   Manual Therapy  Joint mobilization;Soft tissue mobilization;Passive ROM    Joint Mobilization  Joint mobs inferior/posterior to the L glenohumeral joint 8x 8 sets grade II for pain and fluid exchange. Pt reports no pain in either direction this session    Soft tissue mobilization  Palpable tightness/tenderness noted at the L deltoid, biceps and upper trapezius; decreased minimally following light STM.     Passive ROM  P/ROM performed into flexion/abdct, ER/IR w/o an increase in pain pt was less guarded this session with firm end-feel pain-free.              PT Education - 08/12/19 0956    Education Details  Pt will continue with current HEP    Person(s) Educated  Patient    Methods  Explanation    Comprehension  Verbalized understanding          PT Long Term Goals - 08/06/19 1208      PT LONG TERM GOAL #1   Title  Patient will be independent with her HEP for improving shoulder ROM.    Time  4    Period  Weeks    Status  New    Target Date  09/03/19      PT LONG TERM GOAL #2   Title  Patient will improve left shoulder flexion AROM to be >/= 130 for increased ease reaching overhead.    Time  4    Period  Weeks    Status  New    Target Date  09/03/19      PT LONG TERM GOAL #3   Title  Patient will increase left shoulder abduction ROM to >/= 120 degrees to obtai radiation positioning.    Time  4    Period  Weeks    Status  New    Target Date  09/03/19      PT LONG TERM GOAL #4   Title  Patient will report she can reach behind her back to wash with >/= 25% less difficulty and pain.    Time  4    Period  Weeks     Status  New    Target Date  09/03/19            Plan - 08/12/19 0902    Clinical Impression Statement  Pt was  able to tolerate an increase in exercise/activities this session w/o an increase in pain from baseline. Low load, low amplitude, high repetition shoulder stabilization exercises initiated this session w/an initial increase in pain from baseline that decreased to original baseline as repetitions continued with correct form. Palpable tightness/tenderness continues at the L deltoid, biceps and upper trapezius; decreased following light STM. P/ROM was performed in all major shoulder movements flexoin/abdct, ER/IR w/o an increase in pain and less guarding from her previous session with firm end-feel without pain. Pt was able to tolerate Grade II shoulder mobs this session w/o reported increase in pain from baseline into posterior mobs as was reported at her previous session. Pt will benefit from continued POC.    PT Frequency  2x / week    PT Duration  4 weeks    PT Treatment/Interventions  ADLs/Self Care Home Management;Therapeutic exercise;Patient/family education;Moist Heat;Therapeutic activities;Manual techniques;Joint Manipulations;Passive range of motion;Cryotherapy    PT Next Visit Plan  Continue to progress stabilization exercises, update HEP    PT Home Exercise Plan  post op shoulder ROM HEP    Consulted and Agree with Plan of Care  Patient       Patient will benefit from skilled therapeutic intervention in order to improve the following deficits and impairments:  Postural dysfunction, Decreased knowledge of precautions, Impaired UE functional use, Pain, Decreased range of motion, Decreased strength  Visit Diagnosis: Malignant neoplasm of overlapping sites of left breast in female, estrogen receptor negative (HCC)  Abnormal posture  Stiffness of left shoulder, not elsewhere classified     Problem List Patient Active Problem List   Diagnosis Date Noted  . Family history  of breast cancer   . Family history of throat cancer   . Family history of cervical cancer   . Malignant neoplasm of overlapping sites of left breast in female, estrogen receptor negative ([place]) 08/01/2019  . Family history of colon cancer mom 11/05/2018  . Long term (current) use of anticoagulants 09/17/2017  . Encounter for therapeutic drug monitoring 11/21/2013  . Asthma with bronchitis 09/01/2013  . Chest pain 01/01/2013  . Aortic insufficiency 10/25/2011  . Chronic anticoagulation 09/29/2011  . Nocturnal dyspnea 09/29/2011  . DVT (deep venous thrombosis) ([place]) 11/17/2010  . VITAMIN D DEFICIENCY 08/06/2009  . DVT 01/26/2009  . KNEE PAIN, RIGHT 06/19/2008  . EDEMA 06/19/2008  . ANKLE PAIN, LEFT 02/13/2008  . HYPERKALEMIA 08/20/2007  . HYPOKALEMIA 08/20/2007  . HYPERLIPIDEMIA 07/18/2007  . Cardiomyopathy, hypertensive ([place]) 07/18/2007  . Essential hypertension 06/26/2007  . DISEASE, HYPERTENSIVE HEART NOS W/HF 06/26/2007  . Congestive heart failure ([place]) 06/26/2007  . Allergic rhinitis, cause unspecified 06/26/2007  . ASTHMA 06/26/2007  . SYMPTOM, SYNCOPE AND COLLAPSE 06/26/2007  . HEADACHE 06/26/2007  . HEART MURMUR, HX OF 06/26/2007  . DVT, HX OF 06/26/2007    Ander Purpura, PT 08/12/2019, 10:00 AM  [place], [place], [address] Phone: [phone]   Fax:  [phone]  Name: [name] Foster MRN: [id number] Date of Birth: 27-Sep-1951

## 2019-08-13 ENCOUNTER — Telehealth: Payer: Self-pay | Admitting: *Deleted

## 2019-08-13 ENCOUNTER — Other Ambulatory Visit: Payer: Self-pay

## 2019-08-13 ENCOUNTER — Telehealth: Payer: Self-pay

## 2019-08-13 ENCOUNTER — Telehealth: Payer: Self-pay | Admitting: Hematology and Oncology

## 2019-08-13 ENCOUNTER — Telehealth: Payer: Self-pay | Admitting: Medical Oncology

## 2019-08-13 ENCOUNTER — Ambulatory Visit (HOSPITAL_COMMUNITY): Payer: Medicare Other

## 2019-08-13 DIAGNOSIS — R9389 Abnormal findings on diagnostic imaging of other specified body structures: Secondary | ICD-10-CM

## 2019-08-13 NOTE — Telephone Encounter (Signed)
Nutrition  Patient identified from attending Breast Clinic on 08/06/2019.    Chart reviewed  Called patient to introduce self and service at [facility].  No answer and unable to leave message.  Patient was given nutrition packet with RD contact information on 11/4.  Please refer to RD if patient needs services.    Taisei Bonnette B. Zenia Resides, [place], [place] Registered Dietitian [phone] (pager)

## 2019-08-13 NOTE — Telephone Encounter (Signed)
Scheduled appt per 11/11 sch message - spoke with patient and she is aware of appt date and time

## 2019-08-13 NOTE — Telephone Encounter (Signed)
Spoke to pt concerning [place] from 08/06/19. Denies questions or concerns regarding dx or treatment care plan. Confirmed future appts. Encourage pt to call with needs. Received verbal understanding.

## 2019-08-13 NOTE — Telephone Encounter (Signed)
[id number]: Referral Follow-up call to patient to inquire as to whether she has any question regarding the information I provided her last week. Patient states she has not read it all yet and does not have any questions at this time. Patient will be in the clinic for appointments on Friday and I inquired with her if I can meet with her at that time to answer any questions or to see whether she would like to participate with study, patient stated that was fine. I thanked patient for her time to day and encouraged her to contact me in the meantime.  Maxwell Marion, RN, BSN, [facility] 08/13/2019 2:30 PM

## 2019-08-13 NOTE — Progress Notes (Signed)
Neurology referral successfully faxed to [facility] at [phone].

## 2019-08-14 ENCOUNTER — Ambulatory Visit: Payer: Medicare Other

## 2019-08-14 ENCOUNTER — Other Ambulatory Visit (HOSPITAL_COMMUNITY)
Admission: RE | Admit: 2019-08-14 | Discharge: 2019-08-14 | Disposition: A | Discharge status: Home / Self Care | Payer: Medicare Other | Source: Ambulatory Visit | Attending: General Surgery | Admitting: General Surgery

## 2019-08-14 ENCOUNTER — Other Ambulatory Visit: Payer: Self-pay

## 2019-08-14 DIAGNOSIS — R293 Abnormal posture: Secondary | ICD-10-CM | POA: Diagnosis not present

## 2019-08-14 DIAGNOSIS — C50812 Malignant neoplasm of overlapping sites of left female breast: Secondary | ICD-10-CM

## 2019-08-14 DIAGNOSIS — Z171 Estrogen receptor negative status [ER-]: Secondary | ICD-10-CM

## 2019-08-14 DIAGNOSIS — Z01812 Encounter for preprocedural laboratory examination: Secondary | ICD-10-CM | POA: Diagnosis not present

## 2019-08-14 DIAGNOSIS — Z20828 Contact with and (suspected) exposure to other viral communicable diseases: Secondary | ICD-10-CM | POA: Diagnosis not present

## 2019-08-14 DIAGNOSIS — M25612 Stiffness of left shoulder, not elsewhere classified: Secondary | ICD-10-CM

## 2019-08-14 NOTE — Therapy (Signed)
[place], [place], [address] Phone: [phone]   Fax:  [phone]  Physical Therapy Treatment  Patient Details  Name: [name] Foster MRN: [id number] Date of Birth: [birth year] Referring Provider (PT): Dr. Stark Klein   Encounter Date: 08/14/2019  PT End of Session - 08/14/19 0909    Visit Number  4    Number of Visits  8    Date for PT Re-Evaluation  09/03/19    PT Start Time  0908    PT Stop Time  1001    PT Time Calculation (min)  53 min    Activity Tolerance  Patient tolerated treatment well    Behavior During Therapy  [facility] for tasks assessed/performed       Past Medical History:  Diagnosis Date  . Allergic rhinitis   . Aortic valve disorders   . Asthma   . Family history of breast cancer   . Family history of cervical cancer   . Family history of colon cancer   . Family history of throat cancer   . Hyperlipidemia   . Hypertension   . Long term (current) use of anticoagulants   . Other primary cardiomyopathies   . Personal history of colonic polyps   . Personal history of venous thrombosis and embolism     Past Surgical History:  Procedure Laterality Date  . ABDOMINAL HYSTERECTOMY    . CARDIAC CATHETERIZATION    . DILATION AND CURETTAGE OF UTERUS     several  . Rt knee arthoscopic    . TEE WITHOUT CARDIOVERSION N/A 02/21/2013   Procedure: TRANSESOPHAGEAL ECHOCARDIOGRAM (TEE);  Surgeon: Lelon Perla, MD;  Location: [facility] ENDOSCOPY;  Service: Cardiovascular;  Laterality: N/A;    There were no vitals filed for this visit.  Subjective Assessment - 08/14/19 0910    Subjective  Pt reports that after her last session she had to go get her MRI and stay in a position on a hard table with L arm flexed overhead and adducted which felt okay at first but then got worse and worse and hurt for the rest of the day. she states that she was a 5/10 pain yesterday but is starting to feel better  today.    Pertinent History  Patient was diagnosed on 07/24/2019 with left grade III invasive ductal carcinoma breast cancer. There are 2 masses in the left breast: one measures 3.6 cm in the lower outer quadrant and the other measures 4 mm in the upper outer quadrant. They are ER/PR negative and HER2 positive with a Ki67 of 70%. She has 4 abnormal appearing axillary lymph nodes. One was biopsied and found to be positive. She had a right rotator cuff repair in 07/2016 and has had left shoulder pain since that time. She has never had her left shoulder ROM limitation or pain assessed by a physician.    Patient Stated Goals  Reduce lymphedema risk and learn post op shoulder ROM HEP; improve left shoulder ROM pre-operatively to allow for better surgical outcomes and radiation positioning    Currently in Pain?  No/denies                       [facility] Adult PT Treatment/Exercise - 08/14/19 0001      Shoulder Exercises: Supine   Horizontal ABduction  AROM;Both;10 reps    Horizontal ABduction Limitations  VC for full pain-free movement on the L     External Rotation  AROM;Both;10  reps    External Rotation Limitations  supine with hands behind head squeezing elbows together then pushing away w/VC for correct movement and to avoid pain.     Other Supine Exercises  Supine chest press w/dowel followed by supine chest press w/flexion using dowel with good movement throughout improving since her last session. VC to avoid pain with flexion      Shoulder Exercises: Seated   External Rotation  AROM;10 reps;Both    External Rotation Limitations  Bil/elbows at 90 degrees, VC for scapular squeeze and correct movement. Less cueing required today.     Other Seated Exercises  Shoulder isometric contraction with VC to just activate the muscle then hold without using all of her strength into IR, ER avoiding painful contractions 10x10 seconds VC for correct alignment       Shoulder Exercises: Sidelying    External Rotation  Strengthening;Left;10 reps    External Rotation Limitations  no weight, VC for not extending elbow and scapular squeeze at end ROM with tactile cues to prevent elbow extension compensation    ABduction  AROM;Left;10 reps    ABduction Limitations  tactile cueing and VC to prevent L shoulder shrug compensation with pt improving for last 4 reps without needing tactile input. Pain-free.       Shoulder Exercises: Standing   Flexion  AAROM;Left;10 reps    Flexion Limitations  wall wash no VC to avoid trunk bend this session. VC to avoid pain were needed and for slight hold at end range pain-free movement.     Other Standing Exercises  low load, high repetition, small amplitude shoulder stability/proprioceptive exercise into shoulder adduction, extension, depression on the L 30x with VC, demonstration for correct movement and to prevent shoulder elevation      Manual Therapy   Manual Therapy  Joint mobilization;Soft tissue mobilization;Passive ROM    Joint Mobilization  Joint mobs grade II-III inferior/posterior to the L glenohumeral joint 8x 8 sets grade II for pain and fluid exchange. Pt reports no pain in either direction this session    Soft tissue mobilization  Palpable tightness/tenderness noted at the L deltoid, biceps and upper trapezius, infra/supraspinatus; decreased minimally following light STM.     Passive ROM  P/ROM performed into flexion/abdct, ER/IR w/o an increase in pain pt was less guarded this session with firm end-feel pain-free.              PT Education - 08/14/19 0946    Education Details  Access Code: [email or web address], pt was educated on her new HEP discussed avoiding pain at this time to decrease risk for increased aggravation of joint tissue.    Person(s) Educated  Patient    Methods  Explanation;Demonstration;Tactile cues;Verbal cues;Handout    Comprehension  Verbalized understanding;Returned demonstration          PT Long Term Goals - 08/06/19 1208       PT LONG TERM GOAL #1   Title  Patient will be independent with her HEP for improving shoulder ROM.    Time  4    Period  Weeks    Status  New    Target Date  09/03/19      PT LONG TERM GOAL #2   Title  Patient will improve left shoulder flexion AROM to be >/= 130 for increased ease reaching overhead.    Time  4    Period  Weeks    Status  New    Target Date  09/03/19  PT LONG TERM GOAL #3   Title  Patient will increase left shoulder abduction ROM to >/= 120 degrees to obtai radiation positioning.    Time  4    Period  Weeks    Status  New    Target Date  09/03/19      PT LONG TERM GOAL #4   Title  Patient will report she can reach behind her back to wash with >/= 25% less difficulty and pain.    Time  4    Period  Weeks    Status  New    Target Date  09/03/19            Plan - 08/14/19 0908    Clinical Impression Statement  Pt presents with significant tightness noted in her L glenohumeral joint capsule as demonstration by significant loss of ROM with end range feel when blocking scapular compensation. Pt is able to tolerate light stabilization ther-ex w/o an increase in pain from baseline which was added to her HEp this session. She continues with tightness/tenderness at the L shoulder in the deltoid, infraspinatus, supraspinatus and biceps; decreased minimally following light STM. GHJ tightness noted and grade II-III mobs for pain and light capsular stretching pain-free prior to performing P/ROM for improved mobility. Pt will benefit form continued POC.    PT Frequency  2x / week    PT Duration  4 weeks    PT Treatment/Interventions  ADLs/Self Care Home Management;Therapeutic exercise;Patient/family education;Moist Heat;Therapeutic activities;Manual techniques;Joint Manipulations;Passive range of motion;Cryotherapy    PT Next Visit Plan  Continue to progress stabilization exercises, update HEP    PT Home Exercise Plan  --    Consulted and Agree with Plan of Care   Patient       Patient will benefit from skilled therapeutic intervention in order to improve the following deficits and impairments:  Postural dysfunction, Decreased knowledge of precautions, Impaired UE functional use, Pain, Decreased range of motion, Decreased strength  Visit Diagnosis: Malignant neoplasm of overlapping sites of left breast in female, estrogen receptor negative (HCC)  Abnormal posture  Stiffness of left shoulder, not elsewhere classified     Problem List Patient Active Problem List   Diagnosis Date Noted  . Family history of breast cancer   . Family history of throat cancer   . Family history of cervical cancer   . Malignant neoplasm of overlapping sites of left breast in female, estrogen receptor negative (HCC) 08/01/2019  . Family history of colon cancer mom 11/05/2018  . Long term (current) use of anticoagulants 09/17/2017  . Encounter for therapeutic drug monitoring 11/21/2013  . Asthma with bronchitis 09/01/2013  . Chest pain 01/01/2013  . Aortic insufficiency 10/25/2011  . Chronic anticoagulation 09/29/2011  . Nocturnal dyspnea 09/29/2011  . DVT (deep venous thrombosis) (HCC) 11/17/2010  . VITAMIN D DEFICIENCY 08/06/2009  . DVT 01/26/2009  . KNEE PAIN, RIGHT 06/19/2008  . EDEMA 06/19/2008  . ANKLE PAIN, LEFT 02/13/2008  . HYPERKALEMIA 08/20/2007  . HYPOKALEMIA 08/20/2007  . HYPERLIPIDEMIA 07/18/2007  . Cardiomyopathy, hypertensive (HCC) 07/18/2007  . Essential hypertension 06/26/2007  . DISEASE, HYPERTENSIVE HEART NOS W/HF 06/26/2007  . Congestive heart failure (HCC) 06/26/2007  . Allergic rhinitis, cause unspecified 06/26/2007  . ASTHMA 06/26/2007  . SYMPTOM, SYNCOPE AND COLLAPSE 06/26/2007  . HEADACHE 06/26/2007  . HEART MURMUR, HX OF 06/26/2007  . DVT, HX OF 06/26/2007     H , PT 08/14/2019, 11:06 AM   [facility] [address] [place], [place], [address] Phone:  [phone]   Fax:  [phone]  Name: [name] Foster MRN: [id number] Date of Birth: 06/08/1951   

## 2019-08-14 NOTE — Patient Instructions (Signed)
Access Code: [email or web address]  URL: [email or web address]  Date: 08/14/2019  Prepared by: Tomma Rakers   Exercises Seated Shoulder External Rotation - 10 reps - 1 sets - 2x daily - 7x weekly Standing Shoulder Flexion Wall Walk - 10 reps - 1 sets - 2x daily - 7x weekly Isometric Shoulder External Rotation - 10 reps - 1 sets - 10 seconds hold - 2x daily - 7x weekly Isometric Shoulder Internal Rotation - 10 reps - 1 sets - 10 seconds hold - 2x daily - 7x weekly Supine Shoulder Flexion Extension AAROM with Dowel - 10 reps - 1 sets - 2x daily - 7x weekly Single Arm Shoulder Extension with Resistance - 30 reps - 1 sets - small movement keep shoulder down don't let it go up to your ear. hold - 2x daily - 7x weekly Shoulder Adduction with Anchored Resistance - 30 reps - 1 sets - small movement make sure that you keep your shoulder down do not let it go up toward your ear hold - 2x daily - 7x weekly Single Arm Scapular Depression with Anchored Resistance - Straight Arm - 30 reps - 1 sets - small movement you will bring your shoulder all the way up and all the way down pressing against the resistance. hold - 1x daily - 7x weekly

## 2019-08-14 NOTE — Progress Notes (Signed)
Patient Care Team: Panosh, Standley Brooking, MD as PCP - General Stanford Breed, Denice Bors, MD (Cardiology) Susa Day, MD (Orthopedic Surgery) Mauro Kaufmann, RN as Oncology Nurse Navigator Rockwell Germany, RN as Oncology Nurse Navigator  DIAGNOSIS:    ICD-10-CM   1. Malignant neoplasm of overlapping sites of left breast in female, estrogen receptor negative ([place])  C50.812    Z17.1     SUMMARY OF ONCOLOGIC HISTORY: Oncology History  Malignant neoplasm of overlapping sites of left breast in female, estrogen receptor negative ([place])  08/01/2019 Initial Diagnosis   Patient palpated left breast lump and skin thickening. Mammogram showed a 3.6cm mass at 3:30 position, a 0.9cm mass at 3:00 position, a 0.7cm mass at 2:00 position, a 0.5cm mass at 2:00 position, a 0.3cm mass at 1:00 position, and a 0.4cm mass at 1:00 position, with 4 abnormal left axillary lymph nodes. Biopsy showed IDC, grade 3, HER-2 + (3+), ER/PR -, Ki67 70%, in the breast and lymph nodes.    08/06/2019 Cancer Staging   Staging form: Breast, AJCC 8th Edition - Clinical: Stage IIIB (cT4, cN1, cM0, G3, ER-, PR-, HER2+) - Signed by Nicholas Lose, MD on 08/06/2019   08/19/2019 -  Chemotherapy   The patient had palonosetron (ALOXI) injection 0.25 mg, 0.25 mg, Intravenous,  Once, 0 of 6 cycles pegfilgrastim-cbqv (UDENYCA) injection 6 mg, 6 mg, Subcutaneous, Once, 0 of 6 cycles CARBOplatin (PARAPLATIN) 429.6 mg in sodium chloride 0.9 % 250 mL chemo infusion, 429.6 mg (100 % of original dose 429.6 mg), Intravenous,  Once, 0 of 6 cycles Dose modification:   (original dose 429.6 mg, Cycle 1) DOCEtaxel (TAXOTERE) 160 mg in sodium chloride 0.9 % 250 mL chemo infusion, 75 mg/m2 = 160 mg, Intravenous,  Once, 0 of 6 cycles pertuzumab (PERJETA) 420 mg in sodium chloride 0.9 % 250 mL chemo infusion, 420 mg (100 % of original dose 420 mg), Intravenous, Once, 0 of 6 cycles Dose modification: 420 mg (original dose 420 mg, Cycle 1, Reason: Provider  Judgment) fosaprepitant (EMEND) 150 mg, dexamethasone (DECADRON) 12 mg in sodium chloride 0.9 % 145 mL IVPB, , Intravenous,  Once, 0 of 6 cycles trastuzumab-anns (KANJINTI) 756 mg in sodium chloride 0.9 % 250 mL chemo infusion, 8 mg/kg = 756 mg (100 % of original dose 8 mg/kg), Intravenous,  Once, 0 of 6 cycles Dose modification: 8 mg/kg (original dose 8 mg/kg, Cycle 1, Reason: Other (see comments), Comment: change to approved brand), 6 mg/kg (original dose 6 mg/kg, Cycle 2, Reason: Other (see comments), Comment: brand change per insurance)  for chemotherapy treatment.      CHIEF COMPLIANT: Cycle 1 TCH Perjeta  INTERVAL HISTORY: [name] Foster is a 68 y.o. with above-mentioned history of left breast cancer. She is currently on neoadjuvant chemotherapy with [place]. CT CAP on 08/12/19 showed the known left breast mass measuring 3.9cm, no evidence of metastatic disease, and a 1.0cm left kidney mass suspicious for small cell renal  carcinoma. Breast MRI on 08/12/19 showed the know mass in the left breast with several irregular enhancing masses in the upper outer quadrant, skin thickening, and multiple level 1 axillary lymph nodes. She presents to the clinic today for cycle 1.   REVIEW OF SYSTEMS:   Constitutional: Denies fevers, chills or abnormal weight loss Eyes: Denies blurriness of vision Ears, nose, mouth, throat, and face: Denies mucositis or sore throat Respiratory: Denies cough, dyspnea or wheezes Cardiovascular: Denies palpitation, chest discomfort Gastrointestinal: Denies nausea, heartburn or change in bowel  habits Skin: Denies abnormal skin rashes Lymphatics: Denies new lymphadenopathy or easy bruising Neurological: Denies numbness, tingling or new weaknesses Behavioral/Psych: Mood is stable, no new changes  Extremities: No lower extremity edema Breast: denies any pain or lumps or nodules in either breasts All other systems were reviewed with the patient and are negative.  I  have reviewed the past medical history, past surgical history, social history and family history with the patient and they are unchanged from previous note.  ALLERGIES:  is allergic to tetanus toxoid; aspirin; and penicillins.  MEDICATIONS:  Current Outpatient Medications  Medication Sig Dispense Refill   Albuterol Sulfate (PROAIR RESPICLICK) 270 (90 Base) MCG/ACT AEPB Inhale 2 puffs into the lungs every 6 (six) hours as needed. (Patient taking differently: Inhale 2 puffs into the lungs every 6 (six) hours as needed (SOB / wheezing). ) 2 each 1   amLODipine (NORVASC) 10 MG tablet TAKE 1 TABLET BY MOUTH  DAILY 90 tablet 3   atorvastatin (LIPITOR) 20 MG tablet TAKE 1 TABLET BY MOUTH  DAILY 90 tablet 1   Carboxymethylcellul-Glycerin (CLEAR EYES FOR DRY EYES) 1-0.25 % SOLN Place 1 drop into both eyes 2 (two) times daily as needed (dry eyes).     Cholecalciferol (VITAMIN D) 50 MCG (2000 UT) tablet Take 2,000 Units by mouth daily.     dexamethasone (DECADRON) 4 MG tablet Take 1 tablet (4 mg total) by mouth 2 (two) times daily. Take 1 tablet day before chemo and 1 tablet day after chemo with food 12 tablet 0   fexofenadine (ALLEGRA) 180 MG tablet Take 180 mg by mouth daily as needed for allergies.      fluticasone (FLONASE) 50 MCG/ACT nasal spray USE 2 SPRAYS IN EACH NOSTRIL DAILY. PT NEEDS TO SCHEDULE A FOLLOW UP APPT BEFORE NEXT REFILL. (Patient taking differently: Place 2 sprays into both nostrils daily. ) 16 g 0   furosemide (LASIX) 40 MG tablet TAKE 1 TABLET BY MOUTH  DAILY 90 tablet 1   hydrALAZINE (APRESOLINE) 50 MG tablet TAKE 1 TABLET BY MOUTH 3  TIMES DAILY 270 tablet 3   lidocaine-prilocaine (EMLA) cream Apply to affected area once 30 g 3   LORazepam (ATIVAN) 0.5 MG tablet Take 1 tablet (0.5 mg total) by mouth at bedtime as needed for sleep. 30 tablet 0   metoprolol tartrate (LOPRESSOR) 50 MG tablet TAKE 1 TABLET BY MOUTH  TWICE DAILY 180 tablet 1   ondansetron (ZOFRAN) 8 MG  tablet Take 1 tablet (8 mg total) by mouth 2 (two) times daily as needed (Nausea or vomiting). Begin 4 days after chemotherapy. 30 tablet 1   potassium chloride SA (KLOR-CON) 20 MEQ tablet TAKE 1 TABLET BY MOUTH  DAILY 90 tablet 3   prochlorperazine (COMPAZINE) 10 MG tablet Take 1 tablet (10 mg total) by mouth every 6 (six) hours as needed (Nausea or vomiting). 30 tablet 1   ramipril (ALTACE) 10 MG capsule TAKE 1 CAPSULE BY MOUTH  DAILY 90 capsule 1   sodium chloride (OCEAN) 0.65 % SOLN nasal spray Place 1 spray into both nostrils as needed for congestion.     warfarin (COUMADIN) 5 MG tablet Take 1 tablet daily or as directed by anticoagulation clinic. (Patient taking differently: Take 2.5-5 mg by mouth See admin instructions. Take 1 tablet daily or as directed by anticoagulation clinic; 5 mg all days except taking 2.5 mg on Wed) 100 tablet 1   No current facility-administered medications for this visit.     PHYSICAL EXAMINATION:  ECOG PERFORMANCE STATUS: 1 - Symptomatic but completely ambulatory  Vitals:   08/15/19 1110  BP: (!) 147/74  Pulse: 63  Resp: 17  Temp: 98.3 F (36.8 C)  SpO2: 100%   Filed Weights   08/15/19 1110  Weight: 210 lb 4.8 oz (95.4 kg)    GENERAL: alert, no distress and comfortable SKIN: skin color, texture, turgor are normal, no rashes or significant lesions EYES: normal, Conjunctiva are pink and non-injected, sclera clear OROPHARYNX: no exudate, no erythema and lips, buccal mucosa, and tongue normal  NECK: supple, thyroid normal size, non-tender, without nodularity LYMPH: no palpable lymphadenopathy in the cervical, axillary or inguinal LUNGS: clear to auscultation and percussion with normal breathing effort HEART: regular rate & rhythm and no murmurs and no lower extremity edema ABDOMEN: abdomen soft, non-tender and normal bowel sounds MUSCULOSKELETAL: no cyanosis of digits and no clubbing  NEURO: alert & oriented x 3 with fluent speech, no focal  motor/sensory deficits EXTREMITIES: No lower extremity edema  LABORATORY DATA:  I have reviewed the data as listed CMP Latest Ref Rng & Units 08/15/2019 08/06/2019 01/17/2019  Glucose 70 - 99 mg/dL 97 95 98  BUN 8 - 23 mg/dL _0 Creatinine 0.44 - 1.00 mg/dL 1.50(H) 1.75(H) 1.43(H)  Sodium 135 - 145 mmol/L 145 144 145  Potassium 3.5 - 5.1 mmol/L 3.9 3.9 4.2  Chloride 98 - 111 mmol/L 108 103 106  CO2 22 - 32 mmol/L _1 Calcium 8.9 - 10.3 mg/dL 9.1 9.3 8.8  Total Protein 6.5 - 8.1 g/dL 7.2 7.6 -  Total Bilirubin 0.3 - 1.2 mg/dL 0.6 0.4 -  Alkaline Phos 38 - 126 U/L 110 117 117  AST 15 - 41 U/L 18 19 -  ALT 0 - 44 U/L 17 18 -    Lab Results  Component Value Date   WBC 7.0 08/15/2019   HGB 12.4 08/15/2019   HCT 39.0 08/15/2019   MCV 88.2 08/15/2019   PLT 207 08/15/2019   NEUTROABS 4.0 08/15/2019    ASSESSMENT & PLAN:  Malignant neoplasm of overlapping sites of left breast in female, estrogen receptor negative ([place]) 08/01/2019:Patient palpated left breast lump and skin thickening. Mammogram showed a 3.6cm mass at 3:30 position, a 0.9cm mass at 3:00 position, a 0.7cm mass at 2:00 position, a 0.5cm mass at 2:00 position, a 0.3cm mass at 1:00 position, and a 0.4cm mass at 1:00 position, with 4 abnormal left axillary lymph nodes. Biopsy showed IDC, grade 3, HER-2 + (3+), ER/PR -, Ki67 70%, in the breast and lymph nodes.  Treatment plan: 1. Neoadjuvant chemotherapy with TCH Perjeta 6 cycles followed by Herceptin Perjeta maintenance versus Kadcyla maintenance (depending on response to chemo) for 1 year started 08/15/2019 2. Followed by mastectomy with axillary lymph node dissection 3. Followed by adjuvant radiation therapy  --------------------------------------------------------------------------------------------------------------------------------------------------------------- Current treatment: TCHP starts Next week. Echocardiogram: 04/14/2019: EF 60 to 65%, repeat  echocardiogram being done today Labs reviewed, antiemetics reviewed Chemo counseling done, chemo consent obtained CT CAP 08/12/2019: 3.9 cm left breast mass with left axillary lymphadenopathy.  No other distant metastatic disease.  10 mm exophytic enhancing lesion upper pole left kidney suspicious for renal cell carcinoma.  3 mm left lower lobe nodule  Kidney lesion: We will get an MRI and request urology consultation. Breast MRI: 08/13/2019: Diffuse thickening and enhancement of skin of left breast.  Skin thickening 3.5 mm, mass measures 3.5 x 4 x 3.1 cm, multiple irregular enhancing masses.  Axilla 5-6  irregular lymph nodes  She is very overwhelmed with all the testing and therefore we decided not to do the SWOG 1417 clinical trial Return to clinic next week to start chemotherapy.    No orders of the defined types were placed in this encounter.  The patient has a good understanding of the overall plan. she agrees with it. she will call with any problems that may develop before the next visit here.  Nicholas Lose, MD 08/15/2019  Julious Oka Dorshimer, am acting as scribe for Dr. Nicholas Lose.  I have reviewed the above documentation for accuracy and completeness, and I agree with the above.

## 2019-08-15 ENCOUNTER — Other Ambulatory Visit: Payer: Self-pay

## 2019-08-15 ENCOUNTER — Encounter (HOSPITAL_COMMUNITY): Payer: Self-pay

## 2019-08-15 ENCOUNTER — Ambulatory Visit (HOSPITAL_BASED_OUTPATIENT_CLINIC_OR_DEPARTMENT_OTHER)
Admission: RE | Admit: 2019-08-15 | Discharge: 2019-08-15 | Disposition: A | Discharge status: Home / Self Care | Payer: Medicare Other | Source: Ambulatory Visit | Attending: Hematology and Oncology | Admitting: Hematology and Oncology

## 2019-08-15 ENCOUNTER — Inpatient Hospital Stay: Payer: Medicare Other

## 2019-08-15 ENCOUNTER — Ambulatory Visit (HOSPITAL_COMMUNITY)
Admission: RE | Admit: 2019-08-15 | Discharge: 2019-08-15 | Disposition: A | Discharge status: Home / Self Care | Payer: Medicare Other | Source: Ambulatory Visit | Attending: Hematology and Oncology | Admitting: Hematology and Oncology

## 2019-08-15 ENCOUNTER — Inpatient Hospital Stay (HOSPITAL_BASED_OUTPATIENT_CLINIC_OR_DEPARTMENT_OTHER): Payer: Medicare Other | Admitting: Hematology and Oncology

## 2019-08-15 ENCOUNTER — Encounter (HOSPITAL_COMMUNITY)
Admission: RE | Admit: 2019-08-15 | Discharge: 2019-08-15 | Disposition: A | Discharge status: Home / Self Care | Payer: Medicare Other | Source: Ambulatory Visit | Attending: Hematology and Oncology | Admitting: Hematology and Oncology

## 2019-08-15 ENCOUNTER — Encounter: Payer: Self-pay | Admitting: Hematology and Oncology

## 2019-08-15 DIAGNOSIS — N289 Disorder of kidney and ureter, unspecified: Secondary | ICD-10-CM | POA: Diagnosis not present

## 2019-08-15 DIAGNOSIS — C50912 Malignant neoplasm of unspecified site of left female breast: Secondary | ICD-10-CM | POA: Diagnosis not present

## 2019-08-15 DIAGNOSIS — C50812 Malignant neoplasm of overlapping sites of left female breast: Secondary | ICD-10-CM

## 2019-08-15 DIAGNOSIS — Z171 Estrogen receptor negative status [ER-]: Secondary | ICD-10-CM | POA: Insufficient documentation

## 2019-08-15 DIAGNOSIS — R439 Unspecified disturbances of smell and taste: Secondary | ICD-10-CM | POA: Diagnosis not present

## 2019-08-15 DIAGNOSIS — Z800 Family history of malignant neoplasm of digestive organs: Secondary | ICD-10-CM | POA: Diagnosis not present

## 2019-08-15 DIAGNOSIS — Z79899 Other long term (current) drug therapy: Secondary | ICD-10-CM | POA: Diagnosis not present

## 2019-08-15 DIAGNOSIS — I083 Combined rheumatic disorders of mitral, aortic and tricuspid valves: Secondary | ICD-10-CM | POA: Insufficient documentation

## 2019-08-15 DIAGNOSIS — I10 Essential (primary) hypertension: Secondary | ICD-10-CM | POA: Insufficient documentation

## 2019-08-15 DIAGNOSIS — N2889 Other specified disorders of kidney and ureter: Secondary | ICD-10-CM

## 2019-08-15 DIAGNOSIS — Z9012 Acquired absence of left breast and nipple: Secondary | ICD-10-CM | POA: Diagnosis not present

## 2019-08-15 DIAGNOSIS — I429 Cardiomyopathy, unspecified: Secondary | ICD-10-CM | POA: Diagnosis not present

## 2019-08-15 DIAGNOSIS — C773 Secondary and unspecified malignant neoplasm of axilla and upper limb lymph nodes: Secondary | ICD-10-CM | POA: Diagnosis not present

## 2019-08-15 DIAGNOSIS — E785 Hyperlipidemia, unspecified: Secondary | ICD-10-CM | POA: Diagnosis not present

## 2019-08-15 DIAGNOSIS — Z87891 Personal history of nicotine dependence: Secondary | ICD-10-CM | POA: Diagnosis not present

## 2019-08-15 DIAGNOSIS — Z5189 Encounter for other specified aftercare: Secondary | ICD-10-CM | POA: Diagnosis not present

## 2019-08-15 DIAGNOSIS — Z5111 Encounter for antineoplastic chemotherapy: Secondary | ICD-10-CM | POA: Diagnosis not present

## 2019-08-15 DIAGNOSIS — R197 Diarrhea, unspecified: Secondary | ICD-10-CM | POA: Diagnosis not present

## 2019-08-15 LAB — CBC WITH DIFFERENTIAL (CANCER CENTER ONLY)
Abs Immature Granulocytes: 0.01 10*3/uL (ref 0.00–0.07)
Basophils Absolute: 0 10*3/uL (ref 0.0–0.1)
Basophils Relative: 0 %
Eosinophils Absolute: 0 10*3/uL (ref 0.0–0.5)
Eosinophils Relative: 1 %
HCT: 39 % (ref 36.0–46.0)
Hemoglobin: 12.4 g/dL (ref 12.0–15.0)
Immature Granulocytes: 0 %
Lymphocytes Relative: 36 %
Lymphs Abs: 2.5 10*3/uL (ref 0.7–4.0)
MCH: 28.1 pg (ref 26.0–34.0)
MCHC: 31.8 g/dL (ref 30.0–36.0)
MCV: 88.2 fL (ref 80.0–100.0)
Monocytes Absolute: 0.4 10*3/uL (ref 0.1–1.0)
Monocytes Relative: 6 %
Neutro Abs: 4 10*3/uL (ref 1.7–7.7)
Neutrophils Relative %: 57 %
Platelet Count: 207 10*3/uL (ref 150–400)
RBC: 4.42 MIL/uL (ref 3.87–5.11)
RDW: 12.8 % (ref 11.5–15.5)
WBC Count: 7 10*3/uL (ref 4.0–10.5)
nRBC: 0 % (ref 0.0–0.2)

## 2019-08-15 LAB — CMP (CANCER CENTER ONLY)
ALT: 17 U/L (ref 0–44)
AST: 18 U/L (ref 15–41)
Albumin: 3.8 g/dL (ref 3.5–5.0)
Alkaline Phosphatase: 110 U/L (ref 38–126)
Anion gap: 10 (ref 5–15)
BUN: 13 mg/dL (ref 8–23)
CO2: 27 mmol/L (ref 22–32)
Calcium: 9.1 mg/dL (ref 8.9–10.3)
Chloride: 108 mmol/L (ref 98–111)
Creatinine: 1.5 mg/dL — ABNORMAL HIGH (ref 0.44–1.00)
GFR, Est AFR Am: 41 mL/min — ABNORMAL LOW (ref >60)
GFR, Estimated: 36 mL/min — ABNORMAL LOW (ref >60)
Glucose, Bld: 97 mg/dL (ref 70–99)
Potassium: 3.9 mmol/L (ref 3.5–5.1)
Sodium: 145 mmol/L (ref 135–145)
Total Bilirubin: 0.6 mg/dL (ref 0.3–1.2)
Total Protein: 7.2 g/dL (ref 6.5–8.1)

## 2019-08-15 LAB — RESEARCH LABS

## 2019-08-15 IMAGING — NM NM BONE WHOLE BODY
2 series · 2 of 2 positions shown · non-contrast
Comparison: Chest CT [DATE]

CLINICAL DATA: Invasive LEFT breast cancer.

EXAM:
NUCLEAR MEDICINE WHOLE BODY BONE SCAN
TECHNIQUE: Whole body anterior and posterior images were obtained approximately
3 hours after intravenous injection of radiopharmaceutical.
RADIOPHARMACEUTICALS:  20.4 mCi [N2] MDP IV

[Series 1: whole body · 2.66mm/px · 1 of 1 slices shown (1 of 2)]
[im 1/1]
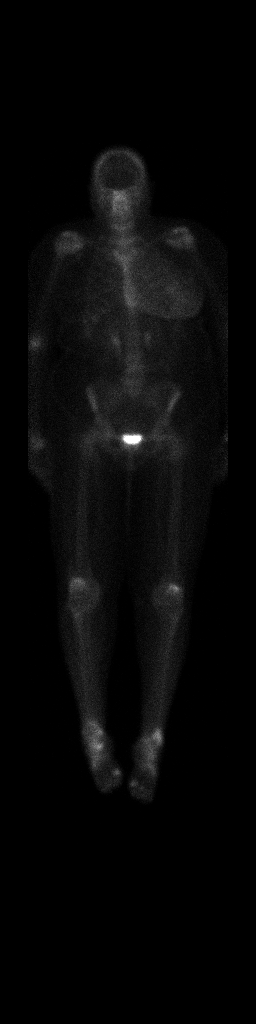

[Series 1: whole body · 2.66mm/px · 1 of 1 slices shown (2 of 2)]
[im 1/1]
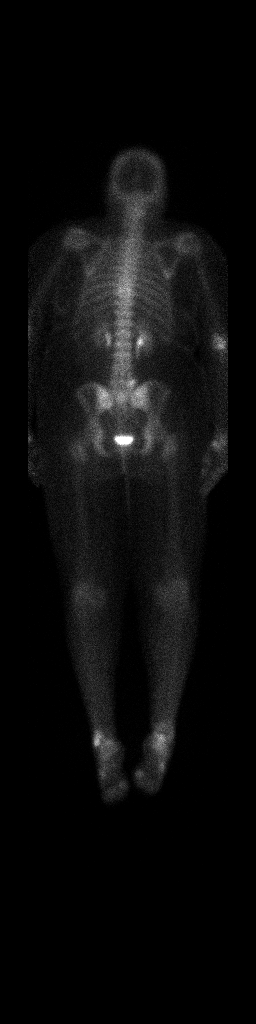

[2 of 2 positions shown; findings below may reference images not displayed]

FINDINGS: No abnormal uptake within the axillary or appendicular skeleton to
suggest metastasis. Focal uptake in the RIGHT lateral aspect of the
L4 vertebral body is favored facet hypertrophy. Uptake in the LEFT
breast tissue related to skin inflammation/carcinoma.
IMPRESSION: No evidence skeletal metastasis

## 2019-08-15 MED ORDER — TECHNETIUM TC 99M MEDRONATE IV KIT
20.0000 | PACK | Freq: Once | INTRAVENOUS | Status: AC | PRN
  Administered 2019-08-15: 20.4 via INTRAVENOUS

## 2019-08-15 NOTE — Progress Notes (Signed)
  Echocardiogram 2D Echocardiogram has been performed.  Adira Limburg G Sabriyah Wilcher 08/15/2019, 10:12 AM

## 2019-08-15 NOTE — Assessment & Plan Note (Signed)
08/01/2019:Patient palpated left breast lump and skin thickening. Mammogram showed a 3.6cm mass at 3:30 position, a 0.9cm mass at 3:00 position, a 0.7cm mass at 2:00 position, a 0.5cm mass at 2:00 position, a 0.3cm mass at 1:00 position, and a 0.4cm mass at 1:00 position, with 4 abnormal left axillary lymph nodes. Biopsy showed IDC, grade 3, HER-2 + (3+), ER/PR -, Ki67 70%, in the breast and lymph nodes.  Treatment plan: 1. Neoadjuvant chemotherapy with TCH Perjeta 6 cycles followed by Herceptin Perjeta maintenance versus Kadcyla maintenance (depending on response to chemo) for 1 year started 08/15/2019 2. Followed by mastectomy with axillary lymph node dissection 3. Followed by adjuvant radiation therapy  --------------------------------------------------------------------------------------------------------------------------------------------------------------- Current treatment: Cycle 1 day 1 TCH Perjeta Echocardiogram: 04/14/2019: EF 60 to 65%, repeat echocardiogram being done today Labs reviewed, antiemetics reviewed Chemo counseling done, chemo consent obtained CT CAP 08/12/2019: 3.9 cm left breast mass with left axillary lymphadenopathy.  No other distant metastatic disease.  10 mm exophytic enhancing lesion upper pole left kidney suspicious for renal cell carcinoma.  3 mm left lower lobe nodule  Kidney lesion: We will get an MRI and request urology consultation. Breast MRI: 08/13/2019: Diffuse thickening and enhancement of skin of left breast.  Skin thickening 3.5 mm, mass measures 3.5 x 4 x 3.1 cm, multiple irregular enhancing masses.  Axilla 5-6 irregular lymph nodes  Return to clinic in 1 week for toxicity check 

## 2019-08-15 NOTE — Anesthesia Preprocedure Evaluation (Addendum)
Anesthesia Evaluation  Patient identified by MRN, date of birth, ID band Patient awake    Reviewed: Allergy & Precautions, NPO status , Patient's Chart, lab work & pertinent test results, reviewed documented beta blocker date and time   History of Anesthesia Complications (+) Family history of anesthesia reaction  Airway Mallampati: II  TM Distance: >3 FB Neck ROM: Full    Dental  (+) Dental Advisory Given   Pulmonary asthma , former smoker,    Pulmonary exam normal breath sounds clear to auscultation       Cardiovascular hypertension, Pt. on medications and Pt. on home beta blockers + CAD and +CHF  Normal cardiovascular exam+ Valvular Problems/Murmurs AS  Rhythm:Regular Rate:Normal     Neuro/Psych  Headaches, negative psych ROS   GI/Hepatic negative GI ROS, Neg liver ROS,   Endo/Other  negative endocrine ROS  Renal/GU negative Renal ROS  negative genitourinary   Musculoskeletal  (+) Arthritis ,   Abdominal (+) + obese,   Peds negative pediatric ROS (+)  Hematology negative hematology ROS (+)   Anesthesia Other Findings   Reproductive/Obstetrics negative OB ROS                            Anesthesia Physical Anesthesia Plan  ASA: III  Anesthesia Plan: General   Post-op Pain Management:    Induction: Intravenous  PONV Risk Score and Plan:   Airway Management Planned: LMA  Additional Equipment: None  Intra-op Plan:   Post-operative Plan: Extubation in OR  Informed Consent: I have reviewed the patients History and Physical, chart, labs and discussed the procedure including the risks, benefits and alternatives for the proposed anesthesia with the patient or authorized representative who has indicated his/her understanding and acceptance.     Dental advisory given  Plan Discussed with: CRNA  Anesthesia Plan Comments: (PAT note written 08/15/2019 by Myra Gianotti,  PA-C. )       Anesthesia Quick Evaluation

## 2019-08-15 NOTE — Progress Notes (Signed)
Same Day Work-Up  PCP - Mariann Laster K. Regis Bill, MD Cardiologist - Kirk Ruths, MD Orthopedic Surgeon- Justice Britain, MD   PPM/ICD - denies  Chest x-ray - N/A EKG - 08/18/19 (DOS) Stress Test - denies ECHO - 08/15/2019 Cardiac Cath - 2004  Sleep Study - denies  Patient denies being a diabetic  Blood Thinner Instructions: Coumadin: per patient, stop 4 days prior to sx. Last dose 08/13/2019  Aspirin Instructions: N/A  ERAS Protcol - Yes PRE-SURGERY Ensure or G2- Not ordered  COVID TEST- 08/14/2019   Anesthesia review: Yes  Patient denies shortness of breath, fever, cough and chest pain at PAT appointment  Coronavirus Screening  Have you experienced the following symptoms:  Cough yes/no: No Fever (>100.105F)  yes/no: No Runny nose yes/no: No Sore throat yes/no: No Difficulty breathing/shortness of breath  yes/no: No  Have you or a family member traveled in the last 14 days and where? yes/no: No   If the patient indicates "YES" to the above questions, their PAT will be rescheduled to limit the exposure to others and, the surgeon will be notified. THE PATIENT WILL NEED TO BE ASYMPTOMATIC FOR 14 DAYS.   If the patient is not experiencing any of these symptoms, the PAT nurse will instruct them to NOT bring anyone with them to their appointment since they may have these symptoms or traveled as well.   Please remind your patients and families that hospital visitation restrictions are in effect and the importance of the restrictions.    All instructions explained to the patient, with a verbal understanding of the material. Patient agrees to go over the instructions while at home for a better understanding. Patient also instructed to self quarantine after being tested for COVID-19. The opportunity to ask questions was provided.

## 2019-08-15 NOTE — Progress Notes (Signed)
Met with patient at registration to introduce myself as Financial Resource Specialist and to offer available resources. ° °Discussed one-time $1000 [employer] grant and qualifications to assist with personal expenses while going through treatment. ° °Gave her my card if interested in applying and for any additional financial questions or concerns.  °

## 2019-08-15 NOTE — Pre-Procedure Instructions (Addendum)
Same Day Work-Up Pre-Procedure Instructions:   Your procedure is scheduled on Monday, November 16th from 07:30 AM- 0830 AM.  Report to [facility] Entrance "A" at 05:30 A.M., and check in at the Admitting office.  Call this number if you have problems the morning of surgery:  [phone]    Remember:  Do not eat or drink after midnight the night before your surgery     Take these medicines the morning of surgery with A SIP OF WATER  metoprolol tartrate (LOPRESSOR) amLODipine (NORVASC)  atorvastatin (LIPITOR)  hydrALAZINE (APRESOLINE) dexamethasone (DECADRON) (if applicable) Albuterol Sulfate (PROAIR RESPICLICK) inhaler fluticasone (FLONASE) 50 MCG/ACT nasal spray  If needed: fexofenadine (ALLEGRA) ondansetron (ZOFRAN)  prochlorperazine (COMPAZINE)  Carboxymethylcellul-Glycerin (CLEAR EYES FOR DRY EYES) eye gtts sodium chloride (OCEAN) 0.65 % SOLN nasal spray  Please bring all inhalers with you the day of surgery.   Follow your surgeon's instructions on when to stop Coumadin.  If no instructions were given by your surgeon then you will need to call the office to get those instructions.    As of today, STOP taking any Aspirin (unless otherwise instructed by your surgeon), Aleve, Naproxen, Ibuprofen, Motrin, Advil, Goody's, BC's, all herbal medications, fish oil, and all vitamins.    The Morning of Surgery  Do not wear jewelry, make-up or nail polish.  Do not wear lotions, powders, perfumes, or deodorant  Do not shave 48 hours prior to surgery.    Do not bring valuables to the hospital.  [facility] is not responsible for any belongings or valuables.  If you are a smoker, DO NOT Smoke 24 hours prior to surgery IF you wear a CPAP at night please bring your mask, tubing, and machine the morning of surgery   Remember that you must have someone to transport you home after your surgery, and remain with you for 24 hours if you are discharged the same day.   Contacts,  glasses, hearing aids, dentures or bridgework may not be worn into surgery.   Leave your suitcase in the car.  After surgery it may be brought to your room.  For patients admitted to the hospital, discharge time will be determined by your treatment team.  Patients discharged the day of surgery will not be allowed to drive home.    Special instructions:   [facility]- Preparing For Surgery  Please follow these instructions carefully.   1. Shower the NIGHT BEFORE SURGERY and the MORNING OF SURGERY with antibacterial soap, such as Dial with a scrungie or a clean washcloth..   2. You may wash your hair with your normal shampoo.  3. Wash thoroughly, paying special attention to the area where your surgery will be performed.  4. Pat yourself dry with a CLEAN TOWEL.  5. Wear CLEAN PAJAMAS to bed the night before surgery, wear comfortable clothes the morning of surgery  6. Place CLEAN SHEETS on your bed the night of your first shower and DO NOT SLEEP WITH PETS.    Day of Surgery: Do not apply any deodorants/lotions. Please wear clean clothes to the hospital/surgery center.   Remember to brush your teeth WITH YOUR REGULAR TOOTHPASTE.

## 2019-08-15 NOTE — Progress Notes (Signed)
Anesthesia Chart Review: [name] Foster   Case: [id number] Date/Time: 08/18/19 0715   Procedure: INSERTION PORT-A-CATH WITH POSSIBLE ULTRASOUND (N/A )   Anesthesia type: General   Pre-op diagnosis: LEFT BREAST CANCER   Location: [facility] OR ROOM 01 / [place] [facility]   Surgeon: Stark Klein, MD      DISCUSSION: Patient is a 68 year old female scheduled for the above procedure.  History includes smoking, left breast cancer (invasive ductal carcinoma grade 3 with + left axillary lymph node 07/29/19), HTN, HLD, non-ischemic cardiomyopathy (diagnosed 12/2002), aortic valve disease (moderate AR 08/2019), moderate mitral regurgitation (08/2019), asthma, recurrent RLE DVT (prior to 2008; managed wtih warfarin), obesity. Labs in [facility] since 07/2017 suggest CKD (Cr 1.43-1.75).  Patient reported last warfarin dose 08/13/19 (per Dr. Marlowe Aschoff note, hold warfarin for 4 days pre op).   She had a pre-chemo echo done today per Dr. Lindi Adie that showed EF 55%, moderate MR/AR, distal septal hypokinesis. Last seen EKG is > 1 year ago, so will need updated tracing on the day of surgery. She had labs today 08/15/19 at the [name], but would need PT/INR.  She denied chest pain, SOB, cough, fever per PAT phone RN interview. 08/14/19 COVID-19 test in prcess. If negative and otherwise no acute changes then it is anticipated that she can proceed as planned. Reviewed case with anesthesiologist Nolon Nations, MD.    VS: Ht 5\' 6"  (1.676 m)   Wt 95.4 kg   BMI 33.94 kg/m  BP Readings from Last 3 Encounters:  08/15/19 (!) 147/74  08/06/19 139/66  11/05/18 126/72    PROVIDERS: Burnis Medin, MD is PCP [facility])  - Nicholas Lose, MD is HEM-ONC. Patient seen at [facility] on 08/15/19. Noted left kidney and LLL lung nodules on recent CT, and is ordering an MRI and referring to urology. Plan to start neoadjuvant chemotherapy with TCH Perjeta x 6 cycles followed by Herceptin-Perjeta versus Kadcyla maintenance x 1 year, followed by  mastectomy/axillary LN dissection, followed by radiation.  Kirk Ruths, MD is cardiologist. Last evaluation 10/14/18. He notes that if her BP is controlled, her LVF is typically normal. Continue ACE-I, BB, hydralazine. Repeat echo 04/2019 showed stable EF, AI.   LABS: Labs as of 08/15/19 include: Lab Results  Component Value Date   WBC 7.0 08/15/2019   HGB 12.4 08/15/2019   HCT 39.0 08/15/2019   PLT 207 08/15/2019   GLUCOSE 97 08/15/2019   ALT 17 08/15/2019   AST 18 08/15/2019   NA 145 08/15/2019   K 3.9 08/15/2019   CL 108 08/15/2019   CREATININE 1.50 (H) 08/15/2019   BUN 13 08/15/2019   CO2 27 08/15/2019   TSH 1.33 11/05/2018   INR 2.7 07/31/2019   HGBA1C 5.9 11/05/2018   Lab Results  Component Value Date   CREATININE 1.50 (H) 08/15/2019   CREATININE 1.75 (H) 08/06/2019   CREATININE 1.43 (H) 01/17/2019     IMAGES: CT chest/abd/pelvis 08/12/19 (ordered by Dr. Lindi Adie): IMPRESSION: 1. 3.9 cm heterogeneously enhancing inferior left breast mass with associated left axillary lymphadenopathy. No other evidence for definite metastatic disease in the chest, abdomen, or pelvis. 2. 10 mm exophytic enhancing lesion identified in the upper pole the left kidney, highly suspicious for small renal cell carcinoma. MRI of the abdomen without and with contrast recommended to further evaluate. 3. 3 mm perifissural nodule left lower lobe, likely subpleural lymph node. Attention on follow-up recommended. 4. 4 mm subcapsular hypoattenuating lesion in the anterior hepatic dome, too  small to characterize. While likely benign, close attention on follow-up recommended. 5. Tiny hypoattenuating cortical lesions in both kidneys, likely cyst. 6.  Aortic Atherosclerois (ICD10-170.0)   EKG: Last EKG 04/11/18 ([facility]) and showed SB at 53 bpm, LAD, LVH, ST/T wave abnormality, consider anterolateral infarct. She is for updated EKG on the day of surgery if one not done within past year.     CV: Echo 08/15/19 (pre-chemo): IMPRESSIONS  1. Left ventricular ejection fraction, by visual estimation, is 55%. The left ventricle has normal function. Left ventricular septal wall thickness was normal. There is no left ventricular hypertrophy.  2. Left ventricular diastolic parameters are consistent with Grade II diastolic dysfunction (pseudonormalization).  3. Mildly dilated left ventricular internal cavity size.  4. Distal septal hypokinesis Normal GLS -20.6.  5. Global right ventricle has normal systolic function.The right ventricular size is normal. No increase in right ventricular wall thickness.  6. Left atrial size was normal.  7. Right atrial size was normal.  8. Mild mitral annular calcification.  9. Moderate thickening of the mitral valve leaflet(s). 10. The mitral valve is abnormal. Moderate mitral valve regurgitation. 11. The tricuspid valve is normal in structure. Tricuspid valve regurgitation is mild. 12. Aortic valve regurgitation is moderate. 13. The aortic valve is tricuspid. Aortic valve regurgitation is moderate. Mild aortic valve sclerosis without stenosis. 14. The pulmonic valve was grossly normal. Pulmonic valve regurgitation is mild. 15. Normal pulmonary artery systolic pressure. (Comparison: 04/14/19 EF 60-65%, normal LV systolic function, mild-moderate AR; 04/26/18: LVEF 55-60%, normal LV systolic function, moderate AR, mild MR; 08/20/17: EF 40-45%, diffuse LV hypokinesis, severe AR)    Nuclear stress test 01/08/13: Overall Impression:  Normal stress nuclear study. LV Ejection Fraction: 62%.  LV Wall Motion:  NL LV Function; NL Wall Motion.   RHC/LHC 12/16/02: FINDINGS: 1. The aortic pressure was 152/81.  LV pressure was 148/10.  EDP was 18.     These pressures were obtained prior to the saline bolus. 2. Right heart catheterization pressures     A. RA mean was 15.     B. RV was 42/9.     C. Pulmonary artery pressure was 37/19.     D. Wedge pressure of  20.     E. Thermodilution cardiac output was 2.8 with a cardiac index of 1.4.        Fick cardiac output was 2.2 with a cardiac index of 1.1.     F. Aortic saturation was 93%.  PA saturation was 49%. 3. Single plane ventriculogram revealed a severe diffuse hypokinesis.  The     estimated ejection fraction was 30% to 35%.  There was no mitral     regurgitation noted. 4. The aortic root shot revealed mild ectasia.  There was moderate aortic     insufficiency noted. 5. Coronary angiography     A. The left main coronary artery bifurcated into the left anterior        descending and circumflex vessels.  There was no significant disease        in the left main coronary artery.     B. Left anterior descending:  The left anterior descending artery gave        rise to a small D1, small to moderate D2, small D3, small D4, and goes        on to end as an apical branch.  There was no disease noted in the left        anterior descending or its  branches.     C. Circumflex vessel:  The circumflex vessel was codominant for the        posterior circulation, giving rise to a trivial OM1, trivial OM2, a        large OM3, and in the PDA branch.  There is no disease noted in the        circumflex or its branches.     D. Right coronary artery:  The right coronary artery was a moderate-sized        vessel, giving rise to 2 RV marginals.  There was no disease noted in        the right coronary artery or its branches. IMPRESSION:  Nonischemic cardiomyopathy.  Etiologies include hypertension, viral.  It is possible that the moderate aortic insufficiency is contributing to her left ventricular dysfunction; however, this does not appear to be the prime etiology.  There is no significant widening of the gradient between the systolic and diastolic blood pressure.  She currently is compensated on her current medical regimen.    Past Medical History:  Diagnosis Date  . Allergic rhinitis   . Aortic valve disorders   .  Asthma   . Family history of breast cancer   . Family history of cervical cancer   . Family history of colon cancer   . Family history of throat cancer   . Hyperlipidemia   . Hypertension   . Long term (current) use of anticoagulants   . Other primary cardiomyopathies   . Personal history of colonic polyps   . Personal history of venous thrombosis and embolism     Past Surgical History:  Procedure Laterality Date  . ABDOMINAL HYSTERECTOMY    . CARDIAC CATHETERIZATION    . DILATION AND CURETTAGE OF UTERUS     several  . Rt knee arthoscopic    . TEE WITHOUT CARDIOVERSION N/A 02/21/2013   Procedure: TRANSESOPHAGEAL ECHOCARDIOGRAM (TEE);  Surgeon: Lelon Perla, MD;  Location: [facility] ENDOSCOPY;  Service: Cardiovascular;  Laterality: N/A;    MEDICATIONS: No current facility-administered medications for this encounter.    . Albuterol Sulfate (PROAIR RESPICLICK) 465 (90 Base) MCG/ACT AEPB  . atorvastatin (LIPITOR) 20 MG tablet  . Carboxymethylcellul-Glycerin (CLEAR EYES FOR DRY EYES) 1-0.25 % SOLN  . Cholecalciferol (VITAMIN D) 50 MCG (2000 UT) tablet  . fexofenadine (ALLEGRA) 180 MG tablet  . fluticasone (FLONASE) 50 MCG/ACT nasal spray  . furosemide (LASIX) 40 MG tablet  . metoprolol tartrate (LOPRESSOR) 50 MG tablet  . ramipril (ALTACE) 10 MG capsule  . sodium chloride (OCEAN) 0.65 % SOLN nasal spray  . warfarin (COUMADIN) 5 MG tablet  . amLODipine (NORVASC) 10 MG tablet  . dexamethasone (DECADRON) 4 MG tablet  . hydrALAZINE (APRESOLINE) 50 MG tablet  . lidocaine-prilocaine (EMLA) cream  . LORazepam (ATIVAN) 0.5 MG tablet  . ondansetron (ZOFRAN) 8 MG tablet  . potassium chloride SA (KLOR-CON) 20 MEQ tablet  . prochlorperazine (COMPAZINE) 10 MG tablet     Myra Gianotti, PA-C Surgical Short Stay/Anesthesiology [facility] Phone [phone] [facility] Phone [phone] 08/15/2019 2:57 PM

## 2019-08-16 LAB — NOVEL CORONAVIRUS, NAA (HOSP ORDER, SEND-OUT TO REF LAB; TAT 18-24 HRS): SARS-CoV-2, NAA: NOT DETECTED

## 2019-08-18 ENCOUNTER — Ambulatory Visit (HOSPITAL_COMMUNITY): Payer: Medicare Other

## 2019-08-18 ENCOUNTER — Other Ambulatory Visit: Payer: Self-pay

## 2019-08-18 ENCOUNTER — Encounter (HOSPITAL_COMMUNITY)
Admission: RE | Disposition: A | Discharge status: Home / Self Care | Payer: Self-pay | Source: Home / Self Care | Attending: General Surgery

## 2019-08-18 ENCOUNTER — Ambulatory Visit (HOSPITAL_COMMUNITY): Payer: Medicare Other | Admitting: Vascular Surgery

## 2019-08-18 ENCOUNTER — Encounter (HOSPITAL_COMMUNITY): Payer: Self-pay

## 2019-08-18 ENCOUNTER — Ambulatory Visit (HOSPITAL_COMMUNITY)
Admission: RE | Admit: 2019-08-18 | Discharge: 2019-08-18 | Disposition: A | Discharge status: Home / Self Care | Payer: Medicare Other | Attending: General Surgery | Admitting: General Surgery

## 2019-08-18 ENCOUNTER — Encounter: Payer: Self-pay | Admitting: *Deleted

## 2019-08-18 DIAGNOSIS — Z886 Allergy status to analgesic agent status: Secondary | ICD-10-CM | POA: Diagnosis not present

## 2019-08-18 DIAGNOSIS — Z7901 Long term (current) use of anticoagulants: Secondary | ICD-10-CM | POA: Diagnosis not present

## 2019-08-18 DIAGNOSIS — Z79899 Other long term (current) drug therapy: Secondary | ICD-10-CM | POA: Diagnosis not present

## 2019-08-18 DIAGNOSIS — Z87891 Personal history of nicotine dependence: Secondary | ICD-10-CM | POA: Diagnosis not present

## 2019-08-18 DIAGNOSIS — I2510 Atherosclerotic heart disease of native coronary artery without angina pectoris: Secondary | ICD-10-CM | POA: Diagnosis not present

## 2019-08-18 DIAGNOSIS — M1990 Unspecified osteoarthritis, unspecified site: Secondary | ICD-10-CM | POA: Diagnosis not present

## 2019-08-18 DIAGNOSIS — J45909 Unspecified asthma, uncomplicated: Secondary | ICD-10-CM | POA: Insufficient documentation

## 2019-08-18 DIAGNOSIS — C50912 Malignant neoplasm of unspecified site of left female breast: Secondary | ICD-10-CM | POA: Insufficient documentation

## 2019-08-18 DIAGNOSIS — Z1501 Genetic susceptibility to malignant neoplasm of breast: Secondary | ICD-10-CM | POA: Insufficient documentation

## 2019-08-18 DIAGNOSIS — I110 Hypertensive heart disease with heart failure: Secondary | ICD-10-CM | POA: Diagnosis not present

## 2019-08-18 DIAGNOSIS — Z880 Allergy status to penicillin: Secondary | ICD-10-CM | POA: Insufficient documentation

## 2019-08-18 DIAGNOSIS — Z171 Estrogen receptor negative status [ER-]: Secondary | ICD-10-CM | POA: Diagnosis not present

## 2019-08-18 DIAGNOSIS — Z452 Encounter for adjustment and management of vascular access device: Secondary | ICD-10-CM | POA: Diagnosis not present

## 2019-08-18 DIAGNOSIS — C50812 Malignant neoplasm of overlapping sites of left female breast: Secondary | ICD-10-CM | POA: Diagnosis not present

## 2019-08-18 DIAGNOSIS — Z419 Encounter for procedure for purposes other than remedying health state, unspecified: Secondary | ICD-10-CM

## 2019-08-18 DIAGNOSIS — Z95828 Presence of other vascular implants and grafts: Secondary | ICD-10-CM

## 2019-08-18 DIAGNOSIS — I509 Heart failure, unspecified: Secondary | ICD-10-CM | POA: Diagnosis not present

## 2019-08-18 HISTORY — DX: Atherosclerotic heart disease of native coronary artery without angina pectoris: I25.10

## 2019-08-18 HISTORY — DX: Cardiac murmur, unspecified: R01.1

## 2019-08-18 HISTORY — DX: Family history of other specified conditions: Z84.89

## 2019-08-18 HISTORY — DX: Unspecified osteoarthritis, unspecified site: M19.90

## 2019-08-18 HISTORY — PX: PORTACATH PLACEMENT: SHX2246

## 2019-08-18 HISTORY — DX: Heart failure, unspecified: I50.9

## 2019-08-18 LAB — PROTIME-INR
INR: 1.1 (ref 0.8–1.2)
Prothrombin Time: 13.6 seconds (ref 11.4–15.2)

## 2019-08-18 IMAGING — CR DG CHEST 1V PORT
1 series · 1 of 1 positions shown · non-contrast
Comparison: CT chest with contrast [DATE]

CLINICAL DATA: Port-A-Cath placement.

EXAM:
PORTABLE CHEST 1 VIEW

[AP]
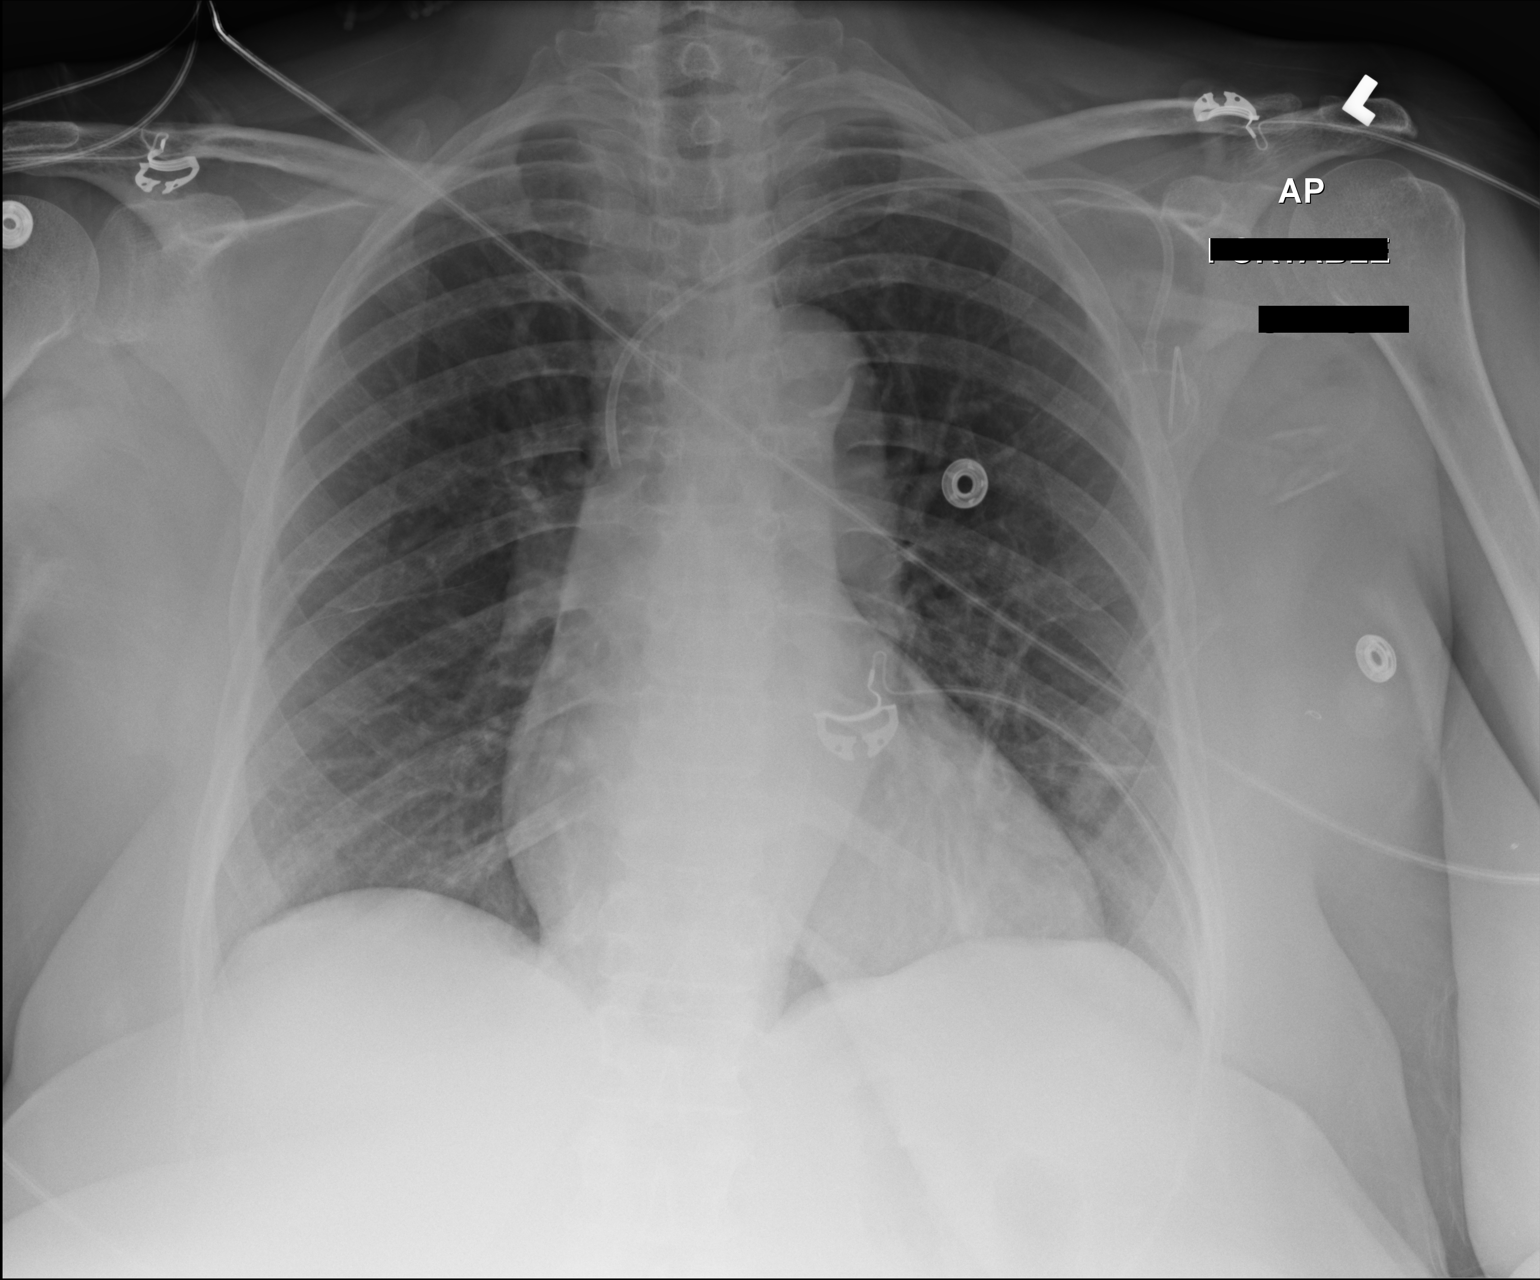

[1 of 1 positions shown; findings below may reference images not displayed]

FINDINGS: The heart size is normal. Atherosclerotic calcifications are seen at
the aortic arch. The lungs are clear. There is no edema or effusion.

A new left subclavian Port-A-Cath is in place. The tip is in the mid
SVC. The port is accessed. There is no pneumothorax.
IMPRESSION: 1. Interval placement of left subclavian Port-A-Cath without
radiographic evidence for complication.
2. Atherosclerosis of the thoracic aorta.
3. No acute cardiopulmonary disease.

## 2019-08-18 SURGERY — INSERTION, TUNNELED CENTRAL VENOUS DEVICE, WITH PORT
Anesthesia: General | Site: Chest | Laterality: Left

## 2019-08-18 MED ORDER — 0.9 % SODIUM CHLORIDE (POUR BTL) OPTIME
TOPICAL | Status: DC | PRN
  Administered 2019-08-18: 1000 mL

## 2019-08-18 MED ORDER — SODIUM CHLORIDE 0.9 % IV SOLN
INTRAVENOUS | Status: DC | PRN
  Administered 2019-08-18: 500 mL

## 2019-08-18 MED ORDER — SODIUM CHLORIDE 0.9 % IV SOLN
INTRAVENOUS | Status: AC
  Filled 2019-08-18: qty 1.2

## 2019-08-18 MED ORDER — LIDOCAINE-EPINEPHRINE 1 %-1:100000 IJ SOLN
INTRAMUSCULAR | Status: AC
  Filled 2019-08-18: qty 1

## 2019-08-18 MED ORDER — MIDAZOLAM HCL 2 MG/2ML IJ SOLN
INTRAMUSCULAR | Status: AC
  Filled 2019-08-18: qty 2

## 2019-08-18 MED ORDER — PROPOFOL 10 MG/ML IV BOLUS
INTRAVENOUS | Status: AC
  Filled 2019-08-18: qty 20

## 2019-08-18 MED ORDER — PROPOFOL 10 MG/ML IV BOLUS
INTRAVENOUS | Status: DC | PRN
  Administered 2019-08-18: 150 mg via INTRAVENOUS

## 2019-08-18 MED ORDER — HEPARIN SOD (PORK) LOCK FLUSH 100 UNIT/ML IV SOLN
INTRAVENOUS | Status: AC
  Filled 2019-08-18: qty 5

## 2019-08-18 MED ORDER — DEXAMETHASONE SODIUM PHOSPHATE 10 MG/ML IJ SOLN
INTRAMUSCULAR | Status: AC
  Filled 2019-08-18: qty 1

## 2019-08-18 MED ORDER — LACTATED RINGERS IV SOLN
INTRAVENOUS | Status: DC | PRN
  Administered 2019-08-18: 07:00:00 via INTRAVENOUS

## 2019-08-18 MED ORDER — EPHEDRINE SULFATE-NACL 50-0.9 MG/10ML-% IV SOSY
PREFILLED_SYRINGE | INTRAVENOUS | Status: DC | PRN
  Administered 2019-08-18 (×2): 10 mg via INTRAVENOUS

## 2019-08-18 MED ORDER — STERILE WATER FOR IRRIGATION IR SOLN
Status: DC | PRN
  Administered 2019-08-18: 1000 mL

## 2019-08-18 MED ORDER — CHLORHEXIDINE GLUCONATE CLOTH 2 % EX PADS: 6.0000 | MEDICATED_PAD | Freq: Once | CUTANEOUS | Status: DC

## 2019-08-18 MED ORDER — CIPROFLOXACIN IN D5W 400 MG/200ML IV SOLN
400.0000 mg | INTRAVENOUS | Status: AC
  Administered 2019-08-18: 400 mg via INTRAVENOUS
  Filled 2019-08-18: qty 200

## 2019-08-18 MED ORDER — ONDANSETRON HCL 4 MG/2ML IJ SOLN
INTRAMUSCULAR | Status: AC
  Filled 2019-08-18: qty 2

## 2019-08-18 MED ORDER — LIDOCAINE-EPINEPHRINE 1 %-1:100000 IJ SOLN
INTRAMUSCULAR | Status: DC | PRN
  Administered 2019-08-18: 10 mL

## 2019-08-18 MED ORDER — HEPARIN SOD (PORK) LOCK FLUSH 100 UNIT/ML IV SOLN
INTRAVENOUS | Status: DC | PRN
  Administered 2019-08-18: 500 [IU] via INTRAVENOUS

## 2019-08-18 MED ORDER — BUPIVACAINE HCL (PF) 0.25 % IJ SOLN
INTRAMUSCULAR | Status: AC
  Filled 2019-08-18: qty 30

## 2019-08-18 MED ORDER — MEPERIDINE HCL 25 MG/ML IJ SOLN: 6.2500 mg | INTRAMUSCULAR | Status: DC | PRN

## 2019-08-18 MED ORDER — ACETAMINOPHEN 500 MG PO TABS
1000.0000 mg | ORAL_TABLET | ORAL | Status: AC
  Administered 2019-08-18: 07:00:00 1000 mg via ORAL
  Filled 2019-08-18: qty 2

## 2019-08-18 MED ORDER — FENTANYL CITRATE (PF) 100 MCG/2ML IJ SOLN
INTRAMUSCULAR | Status: DC | PRN
  Administered 2019-08-18: 25 ug via INTRAVENOUS

## 2019-08-18 MED ORDER — EPHEDRINE 5 MG/ML INJ
INTRAVENOUS | Status: AC
  Filled 2019-08-18: qty 10

## 2019-08-18 MED ORDER — ONDANSETRON HCL 4 MG/2ML IJ SOLN
INTRAMUSCULAR | Status: DC | PRN
  Administered 2019-08-18: 4 mg via INTRAVENOUS

## 2019-08-18 MED ORDER — MIDAZOLAM HCL 5 MG/5ML IJ SOLN
INTRAMUSCULAR | Status: DC | PRN
  Administered 2019-08-18: 2 mg via INTRAVENOUS

## 2019-08-18 MED ORDER — DEXAMETHASONE SODIUM PHOSPHATE 10 MG/ML IJ SOLN
INTRAMUSCULAR | Status: DC | PRN
  Administered 2019-08-18: 10 mg via INTRAVENOUS

## 2019-08-18 MED ORDER — BUPIVACAINE HCL (PF) 0.25 % IJ SOLN
INTRAMUSCULAR | Status: DC | PRN
  Administered 2019-08-18: 10 mL

## 2019-08-18 MED ORDER — OXYCODONE HCL 5 MG PO TABS: 5.0000 mg | ORAL_TABLET | Freq: Four times a day (QID) | ORAL | 0 refills | Status: DC | PRN

## 2019-08-18 MED ORDER — ONDANSETRON HCL 4 MG/2ML IJ SOLN: 4.0000 mg | Freq: Once | INTRAMUSCULAR | Status: DC | PRN

## 2019-08-18 MED ORDER — FENTANYL CITRATE (PF) 100 MCG/2ML IJ SOLN: 25.0000 ug | INTRAMUSCULAR | Status: DC | PRN

## 2019-08-18 MED ORDER — FENTANYL CITRATE (PF) 250 MCG/5ML IJ SOLN
INTRAMUSCULAR | Status: AC
  Filled 2019-08-18: qty 5

## 2019-08-18 MED ORDER — LIDOCAINE 2% (20 MG/ML) 5 ML SYRINGE
INTRAMUSCULAR | Status: AC
  Filled 2019-08-18: qty 5

## 2019-08-18 MED ORDER — LIDOCAINE 2% (20 MG/ML) 5 ML SYRINGE
INTRAMUSCULAR | Status: DC | PRN
  Administered 2019-08-18: 60 mg via INTRAVENOUS

## 2019-08-18 SURGICAL SUPPLY — 45 items
ADH SKN CLS APL DERMABOND .7 (GAUZE/BANDAGES/DRESSINGS) ×1
APL PRP STRL LF DISP 70% ISPRP (MISCELLANEOUS) ×1
BAG DECANTER FOR FLEXI CONT (MISCELLANEOUS) ×3 IMPLANT
CANISTER SUCT 3000ML PPV (MISCELLANEOUS) IMPLANT
CHLORAPREP W/TINT 26 (MISCELLANEOUS) ×3 IMPLANT
COVER SURGICAL LIGHT HANDLE (MISCELLANEOUS) ×3 IMPLANT
COVER TRANSDUCER ULTRASND GEL (DRAPE) IMPLANT
COVER WAND RF STERILE (DRAPES) ×3 IMPLANT
DECANTER SPIKE VIAL GLASS SM (MISCELLANEOUS) ×6 IMPLANT
DERMABOND ADVANCED (GAUZE/BANDAGES/DRESSINGS) ×2
DERMABOND ADVANCED .7 DNX12 (GAUZE/BANDAGES/DRESSINGS) ×1 IMPLANT
DRAPE C-ARM 42X120 X-RAY (DRAPES) ×3 IMPLANT
DRAPE CHEST BREAST 15X10 FENES (DRAPES) ×3 IMPLANT
DRAPE WARM FLUID 44X44 (DRAPES) IMPLANT
DRSG TEGADERM 4X4.75 (GAUZE/BANDAGES/DRESSINGS) ×4 IMPLANT
ELECT COATED BLADE 2.86 ST (ELECTRODE) ×3 IMPLANT
ELECT REM PT RETURN 9FT ADLT (ELECTROSURGICAL) ×3
ELECTRODE REM PT RTRN 9FT ADLT (ELECTROSURGICAL) ×1 IMPLANT
GAUZE 4X4 16PLY RFD (DISPOSABLE) ×3 IMPLANT
GAUZE SPONGE 4X4 12PLY STRL LF (GAUZE/BANDAGES/DRESSINGS) ×2 IMPLANT
GEL ULTRASOUND 20GR AQUASONIC (MISCELLANEOUS) IMPLANT
GLOVE BIO SURGEON STRL SZ 6 (GLOVE) ×3 IMPLANT
GLOVE INDICATOR 6.5 STRL GRN (GLOVE) ×3 IMPLANT
GOWN STRL REUS W/ TWL LRG LVL3 (GOWN DISPOSABLE) ×1 IMPLANT
GOWN STRL REUS W/TWL 2XL LVL3 (GOWN DISPOSABLE) ×3 IMPLANT
GOWN STRL REUS W/TWL LRG LVL3 (GOWN DISPOSABLE) ×3
KIT BASIN OR (CUSTOM PROCEDURE TRAY) ×3 IMPLANT
KIT PORT POWER 8FR ISP CVUE (Port) ×2 IMPLANT
KIT TURNOVER KIT B (KITS) ×3 IMPLANT
NEEDLE 22X1 1/2 (OR ONLY) (NEEDLE) ×3 IMPLANT
NS IRRIG 1000ML POUR BTL (IV SOLUTION) ×3 IMPLANT
PAD ARMBOARD 7.5X6 YLW CONV (MISCELLANEOUS) ×3 IMPLANT
PENCIL BUTTON HOLSTER BLD 10FT (ELECTRODE) ×3 IMPLANT
POSITIONER HEAD DONUT 9IN (MISCELLANEOUS) ×3 IMPLANT
SUT MON AB 4-0 PC3 18 (SUTURE) ×3 IMPLANT
SUT PROLENE 2 0 SH DA (SUTURE) ×6 IMPLANT
SUT VIC AB 3-0 SH 27 (SUTURE) ×3
SUT VIC AB 3-0 SH 27X BRD (SUTURE) ×1 IMPLANT
SYR 5ML LUER SLIP (SYRINGE) ×3 IMPLANT
TOWEL GREEN STERILE (TOWEL DISPOSABLE) ×3 IMPLANT
TOWEL GREEN STERILE FF (TOWEL DISPOSABLE) ×3 IMPLANT
TRAY LAPAROSCOPIC MC (CUSTOM PROCEDURE TRAY) ×3 IMPLANT
TUBE CONNECTING 12'X1/4 (SUCTIONS)
TUBE CONNECTING 12X1/4 (SUCTIONS) IMPLANT
YANKAUER SUCT BULB TIP NO VENT (SUCTIONS) IMPLANT

## 2019-08-18 NOTE — Op Note (Addendum)
PREOPERATIVE DIAGNOSIS:  Left breast cancer     POSTOPERATIVE DIAGNOSIS:  Same     PROCEDURE: left subclavian port placement, Bard ClearVue Power Port, MRI safe, 8-French.      SURGEON:  Stark Klein, MD      ANESTHESIA:  General   FINDINGS:  Good venous return, easy flush, and tip of the catheter and   SVC 22 cm.      SPECIMEN:  None.      ESTIMATED BLOOD LOSS:  Minimal.      COMPLICATIONS:  None known.      PROCEDURE:  Pt was identified in the holding area and taken to   the operating room, where patient was placed supine on the operating room   table.  General anesthesia was induced.  Patient's arms were tucked and the upper   chest and neck were prepped and draped in sterile fashion.  Time-out was   performed according to the surgical safety check list.  When all was   correct, we continued.   Local anesthetic was administered over this   area at the angle of the clavicle.  The vein was accessed with 1 pass(es) of the needle, but the wire would not thread easily.  The vein was accessed with 1 more pass of the needle. This time there was good venous return and the wire passed easily with no ectopy.  Fluoroscopy was used to confirm that the wire was in the vena cava.      The patient was placed back level and the area for the pocket was anethetized   with local anesthetic.  A 3-cm transverse incision was made with a #15   blade.  Cautery was used to divide the subcutaneous tissues down to the   pectoralis muscle.  An [employer] retractor was used to elevate the skin   while a pocket was created on top of the pectoralis fascia.  The port   was placed into the pocket to confirm that it was of adequate size.  The   catheter was preattached to the port.  The port was then secured to the   pectoralis fascia with four 2-0 Prolene sutures.  These were clamped and   not tied down yet.    The catheter was tunneled through to the wire exit   site.  The catheter was placed along the wire  to determine what length it should be to be in the SVC.  The catheter was cut at 22 cm.  The tunneler sheath and dilator were passed over the wire and the dilator and wire were removed.  The catheter was advanced through the tunneler sheath and the tunneler sheath was pulled away.  Care was taken to keep the catheter in the tunneler sheath as this occurred. This was advanced and the tunneler sheath was removed.  There was good venous   return and easy flush of the catheter.  The Prolene sutures were tied   down to the pectoral fascia.  The skin was reapproximated using 3-0   Vicryl interrupted deep dermal sutures.    Fluoroscopy was used to re-confirm good position of the catheter.  The skin   was then closed using 4-0 Monocryl in a subcuticular fashion.  The port was flushed with concentrated heparin flush as well.  The wounds were then cleaned, dried, and dressed with Dermabond.  The port was then accessed and the access tubing secured with 4x4 gauze and tegaderm.    The patient was awakened  from anesthesia and taken to the PACU in stable condition.  Needle, sponge, and instrument counts were correct.               Stark Klein, MD

## 2019-08-18 NOTE — Transfer of Care (Signed)
Immediate Anesthesia Transfer of Care Note  Patient: Enes Wegener  Procedure(s) Performed: INSERTION PORT-A-CATH (Left Chest)  Patient Location: PACU  Anesthesia Type:General  Level of Consciousness: awake, alert  and oriented  Airway & Oxygen Therapy: Patient Spontanous Breathing and Patient connected to face mask oxygen  Post-op Assessment: Report given to RN and Post -op Vital signs reviewed and stable  Post vital signs: Reviewed and stable  Last Vitals:  Vitals Value Taken Time  BP 114/75   Temp    Pulse 66 08/18/19 0846  Resp 10 08/18/19 0846  SpO2 100 % 08/18/19 0846  Vitals shown include unvalidated device data.  Last Pain:  Vitals:   08/18/19 0644  TempSrc: Oral  PainSc:          Complications: No apparent anesthesia complications

## 2019-08-18 NOTE — Discharge Instructions (Addendum)
[facility] °Office Phone Number [phone] ° ° POST OP INSTRUCTIONS ° °Always review your discharge instruction sheet given to you by the facility where your surgery was performed. ° °IF YOU HAVE DISABILITY OR FAMILY LEAVE FORMS, YOU MUST BRING THEM TO THE OFFICE FOR PROCESSING.  DO NOT GIVE THEM TO YOUR DOCTOR. ° °1. A prescription for pain medication may be given to you upon discharge.  Take your pain medication as prescribed, if needed.  If narcotic pain medicine is not needed, then you may take acetaminophen (Tylenol) or ibuprofen (Advil) as needed. °2. Take your usually prescribed medications unless otherwise directed °3. If you need a refill on your pain medication, please contact your pharmacy.  They will contact our office to request authorization.  Prescriptions will not be filled after 5pm or on week-ends. °4. You should eat very light the first 24 hours after surgery, such as soup, crackers, pudding, etc.  Resume your normal diet the day after surgery °5. It is common to experience some constipation if taking pain medication after surgery.  Increasing fluid intake and taking a stool softener will usually help or prevent this problem from occurring.  A mild laxative (Milk of Magnesia or Miralax) should be taken according to package directions if there are no bowel movements after 48 hours. °6. You may shower in 48 hours.  The surgical glue will flake off in 2-3 weeks.   °7. ACTIVITIES:  No strenuous activity or heavy lifting for 1 week.   °a. You may drive when you no longer are taking prescription pain medication, you can comfortably wear a seatbelt, and you can safely maneuver your car and apply brakes. °b. RETURN TO WORK:  __________1 week if applicable_______________ °You should see your doctor in the office for a follow-up appointment approximately three-four weeks after your surgery.   ° °WHEN TO CALL YOUR DOCTOR: °1. Fever over 101.0 °2. Nausea and/or vomiting. °3. Extreme  swelling or bruising. °4. Continued bleeding from incision. °5. Increased pain, redness, or drainage from the incision. ° °The clinic staff is available to answer your questions during regular business hours.  Please don’t hesitate to call and ask to speak to one of the nurses for clinical concerns.  If you have a medical emergency, go to the nearest emergency room or call 911.  A surgeon from [facility] is always on call at the hospital. ° °For further questions, please visit [email or web address]  ° °

## 2019-08-18 NOTE — Anesthesia Procedure Notes (Signed)
Procedure Name: LMA Insertion Date/Time: 08/18/2019 7:54 AM Performed by: Genelle Bal, CRNA Pre-anesthesia Checklist: Patient identified, Emergency Drugs available, Suction available and Patient being monitored Patient Re-evaluated:Patient Re-evaluated prior to induction Oxygen Delivery Method: Circle system utilized Preoxygenation: Pre-oxygenation with 100% oxygen Induction Type: IV induction Ventilation: Mask ventilation without difficulty LMA: LMA inserted LMA Size: 4.0 Number of attempts: 1 Airway Equipment and Method: Bite block Placement Confirmation: positive ETCO2 Tube secured with: Tape Dental Injury: Teeth and Oropharynx as per pre-operative assessment

## 2019-08-18 NOTE — H&P (Signed)
[name] Foster is an 68 y.o. female.   Chief Complaint: left breast cancer HPI:  Patient is a 68 yo F with a new dx of left breast cancer, hormone receptor negative, her 2 positive.  She is indicated to get chemotherapy.    Past Medical History:  Diagnosis Date  . Allergic rhinitis   . Aortic valve disorders   . Arthritis   . Asthma   . CHF (congestive heart failure) ([place])   . Coronary artery disease   . Family history of adverse reaction to anesthesia    difficulty waking mother up after surgery  . Family history of breast cancer   . Family history of cervical cancer   . Family history of colon cancer   . Family history of throat cancer   . Heart murmur   . Hyperlipidemia   . Hypertension   . Long term (current) use of anticoagulants   . Other primary cardiomyopathies   . Personal history of colonic polyps   . Personal history of venous thrombosis and embolism     Past Surgical History:  Procedure Laterality Date  . ABDOMINAL HYSTERECTOMY    . CARDIAC CATHETERIZATION    . COLON SURGERY     colonoscopy  . DILATION AND CURETTAGE OF UTERUS     several  . Rt knee arthoscopic    . TEE WITHOUT CARDIOVERSION N/A 02/21/2013   Procedure: TRANSESOPHAGEAL ECHOCARDIOGRAM (TEE);  Surgeon: Lelon Perla, MD;  Location: [facility] ENDOSCOPY;  Service: Cardiovascular;  Laterality: N/A;    Family History  Problem Relation Age of Onset  . Alcohol abuse Other   . Depression Other   . Hyperlipidemia Other   . Hypertension Other   . Kidney disease Other   . Colon cancer Mother        dx late 38s  . Cervical cancer Maternal Grandmother   . Stroke Paternal Grandmother   . Breast cancer Maternal Aunt        dx 24s  . Breast cancer Cousin        dx 45s   Social History:  reports that she quit smoking about 6 weeks ago. She smoked 0.25 packs per day. She has never used smokeless tobacco. She reports current alcohol use. She reports that she does not use drugs.  Allergies:  Allergies   Allergen Reactions  . Penicillins Rash    Did it involve swelling of the face/tongue/throat, SOB, or low BP? Unknown Did it involve sudden or severe rash/hives, skin peeling, or any reaction on the inside of your mouth or nose? Yes Did you need to seek medical attention at a hospital or doctor's office? Yes When did it last happen?in her 97s If all above answers are "NO", may proceed with cephalosporin use.   . Tetanus Toxoid Rash    Caused cellulitis   . Aspirin Rash    Medications Prior to Admission  Medication Sig Dispense Refill  . amLODipine (NORVASC) 10 MG tablet TAKE 1 TABLET BY MOUTH  DAILY 90 tablet 3  . atorvastatin (LIPITOR) 20 MG tablet TAKE 1 TABLET BY MOUTH  DAILY 90 tablet 1  . Carboxymethylcellul-Glycerin (CLEAR EYES FOR DRY EYES) 1-0.25 % SOLN Place 1 drop into both eyes 2 (two) times daily as needed (dry eyes).    . Cholecalciferol (VITAMIN D) 50 MCG (2000 UT) tablet Take 2,000 Units by mouth daily.    . fexofenadine (ALLEGRA) 180 MG tablet Take 180 mg by mouth daily as needed for allergies.     Marland Kitchen  fluticasone (FLONASE) 50 MCG/ACT nasal spray USE 2 SPRAYS IN EACH NOSTRIL DAILY. PT NEEDS TO SCHEDULE A FOLLOW UP APPT BEFORE NEXT REFILL. (Patient taking differently: Place 2 sprays into both nostrils daily. ) 16 g 0  . furosemide (LASIX) 40 MG tablet TAKE 1 TABLET BY MOUTH  DAILY 90 tablet 1  . hydrALAZINE (APRESOLINE) 50 MG tablet TAKE 1 TABLET BY MOUTH 3  TIMES DAILY 270 tablet 3  . metoprolol tartrate (LOPRESSOR) 50 MG tablet TAKE 1 TABLET BY MOUTH  TWICE DAILY 180 tablet 1  . potassium chloride SA (KLOR-CON) 20 MEQ tablet TAKE 1 TABLET BY MOUTH  DAILY 90 tablet 3  . ramipril (ALTACE) 10 MG capsule TAKE 1 CAPSULE BY MOUTH  DAILY 90 capsule 1  . sodium chloride (OCEAN) 0.65 % SOLN nasal spray Place 1 spray into both nostrils as needed for congestion.    Marland Kitchen warfarin (COUMADIN) 5 MG tablet Take 1 tablet daily or as directed by anticoagulation clinic. (Patient taking  differently: Take 2.5-5 mg by mouth See admin instructions. Take 1 tablet daily or as directed by anticoagulation clinic; 5 mg all days except taking 2.5 mg on Wed) 100 tablet 1  . Albuterol Sulfate (PROAIR RESPICLICK) 154 (90 Base) MCG/ACT AEPB Inhale 2 puffs into the lungs every 6 (six) hours as needed. (Patient taking differently: Inhale 2 puffs into the lungs every 6 (six) hours as needed (SOB / wheezing). ) 2 each 1  . dexamethasone (DECADRON) 4 MG tablet Take 1 tablet (4 mg total) by mouth 2 (two) times daily. Take 1 tablet day before chemo and 1 tablet day after chemo with food 12 tablet 0  . lidocaine-prilocaine (EMLA) cream Apply to affected area once 30 g 3  . LORazepam (ATIVAN) 0.5 MG tablet Take 1 tablet (0.5 mg total) by mouth at bedtime as needed for sleep. 30 tablet 0  . ondansetron (ZOFRAN) 8 MG tablet Take 1 tablet (8 mg total) by mouth 2 (two) times daily as needed (Nausea or vomiting). Begin 4 days after chemotherapy. 30 tablet 1  . prochlorperazine (COMPAZINE) 10 MG tablet Take 1 tablet (10 mg total) by mouth every 6 (six) hours as needed (Nausea or vomiting). 30 tablet 1    Results for orders placed or performed during the hospital encounter of 08/18/19 (from the past 48 hour(s))  Protime-INR     Status: None   Collection Time: 08/18/19  6:17 AM  Result Value Ref Range   Prothrombin Time 13.6 11.4 - 15.2 seconds   INR 1.1 0.8 - 1.2    Comment: (NOTE) INR goal varies based on device and disease states. Performed at [facility], [place] [address], [place], [place] [address]    No results found.  Review of Systems  All other systems reviewed and are negative.   Blood pressure 136/69, pulse (!) 45, temperature 98.4 F (36.9 C), temperature source Oral, resp. rate 16, height 5\' 6"  (1.676 m), weight 95.4 kg, SpO2 100 %. Physical Exam  Constitutional: She is oriented to person, place, and time. She appears well-developed and well-nourished. No distress.  HENT:  Head:  Normocephalic and atraumatic.  Neck: Neck supple.  Cardiovascular: Normal rate.  Respiratory: Effort normal. No respiratory distress.  GI: Soft.  Neurological: She is alert and oriented to person, place, and time.  Skin: Skin is warm and dry. No rash noted. She is not diaphoretic. No erythema. No pallor.  Psychiatric: She has a normal mood and affect. Her behavior is normal.  Judgment and thought content normal.     Assessment/Plan Left breast cancer Plan port placement Reviewed risks. Questions answered. Chemo planned tomorrow.  Will leave accessed.    Stark Klein, MD 08/18/2019, 7:36 AM

## 2019-08-18 NOTE — Anesthesia Postprocedure Evaluation (Signed)
Anesthesia Post Note  Patient: [name] Foster  Procedure(s) Performed: INSERTION PORT-A-CATH (Left Chest)     Patient location during evaluation: PACU Anesthesia Type: General Level of consciousness: sedated and patient cooperative Pain management: pain level controlled Vital Signs Assessment: post-procedure vital signs reviewed and stable Respiratory status: spontaneous breathing Cardiovascular status: stable Anesthetic complications: no    Last Vitals:  Vitals:   08/18/19 0915 08/18/19 0936  BP: 126/75 129/65  Pulse: (!) 50 (!) 55  Resp: (!) 9 14  Temp: 36.4 C   SpO2: 98% 100%    Last Pain:  Vitals:   08/18/19 0936  TempSrc:   PainSc: 0-No pain                 Nolon Nations

## 2019-08-18 NOTE — Progress Notes (Signed)
Per Dr. Geralyn Flash request.  Referral faxed to [facility] at [phone].

## 2019-08-19 ENCOUNTER — Inpatient Hospital Stay: Payer: Medicare Other

## 2019-08-19 ENCOUNTER — Encounter: Payer: Self-pay | Admitting: *Deleted

## 2019-08-19 ENCOUNTER — Encounter (HOSPITAL_COMMUNITY): Payer: Self-pay | Admitting: General Surgery

## 2019-08-19 ENCOUNTER — Telehealth: Payer: Self-pay | Admitting: Licensed Clinical Social Worker

## 2019-08-19 ENCOUNTER — Other Ambulatory Visit: Payer: Self-pay

## 2019-08-19 VITALS — BP 135/69 | HR 51 | Temp 98.1°F | Resp 18

## 2019-08-19 DIAGNOSIS — Z9012 Acquired absence of left breast and nipple: Secondary | ICD-10-CM | POA: Diagnosis not present

## 2019-08-19 DIAGNOSIS — Z171 Estrogen receptor negative status [ER-]: Secondary | ICD-10-CM

## 2019-08-19 DIAGNOSIS — Z5111 Encounter for antineoplastic chemotherapy: Secondary | ICD-10-CM | POA: Diagnosis not present

## 2019-08-19 DIAGNOSIS — Z79899 Other long term (current) drug therapy: Secondary | ICD-10-CM | POA: Diagnosis not present

## 2019-08-19 DIAGNOSIS — C50812 Malignant neoplasm of overlapping sites of left female breast: Secondary | ICD-10-CM

## 2019-08-19 DIAGNOSIS — R197 Diarrhea, unspecified: Secondary | ICD-10-CM | POA: Diagnosis not present

## 2019-08-19 DIAGNOSIS — Z87891 Personal history of nicotine dependence: Secondary | ICD-10-CM | POA: Diagnosis not present

## 2019-08-19 DIAGNOSIS — Z5189 Encounter for other specified aftercare: Secondary | ICD-10-CM | POA: Diagnosis not present

## 2019-08-19 DIAGNOSIS — R439 Unspecified disturbances of smell and taste: Secondary | ICD-10-CM | POA: Diagnosis not present

## 2019-08-19 DIAGNOSIS — Z800 Family history of malignant neoplasm of digestive organs: Secondary | ICD-10-CM | POA: Diagnosis not present

## 2019-08-19 DIAGNOSIS — N289 Disorder of kidney and ureter, unspecified: Secondary | ICD-10-CM | POA: Diagnosis not present

## 2019-08-19 DIAGNOSIS — N2889 Other specified disorders of kidney and ureter: Secondary | ICD-10-CM | POA: Diagnosis not present

## 2019-08-19 DIAGNOSIS — C773 Secondary and unspecified malignant neoplasm of axilla and upper limb lymph nodes: Secondary | ICD-10-CM | POA: Diagnosis not present

## 2019-08-19 MED ORDER — ACETAMINOPHEN 325 MG PO TABS
ORAL_TABLET | ORAL | Status: AC
  Filled 2019-08-19: qty 2

## 2019-08-19 MED ORDER — HEPARIN SOD (PORK) LOCK FLUSH 100 UNIT/ML IV SOLN
500.0000 [IU] | Freq: Once | INTRAVENOUS | Status: AC | PRN
  Administered 2019-08-19: 500 [IU]
  Filled 2019-08-19: qty 5

## 2019-08-19 MED ORDER — SODIUM CHLORIDE 0.9 % IV SOLN
Freq: Once | INTRAVENOUS | Status: AC
  Administered 2019-08-19: 08:00:00 via INTRAVENOUS
  Filled 2019-08-19: qty 250

## 2019-08-19 MED ORDER — DIPHENHYDRAMINE HCL 25 MG PO CAPS
ORAL_CAPSULE | ORAL | Status: AC
  Filled 2019-08-19: qty 2

## 2019-08-19 MED ORDER — ACETAMINOPHEN 325 MG PO TABS
650.0000 mg | ORAL_TABLET | Freq: Once | ORAL | Status: AC
  Administered 2019-08-19: 650 mg via ORAL

## 2019-08-19 MED ORDER — SODIUM CHLORIDE 0.9 % IV SOLN
75.0000 mg/m2 | Freq: Once | INTRAVENOUS | Status: AC
  Administered 2019-08-19: 160 mg via INTRAVENOUS
  Filled 2019-08-19: qty 16

## 2019-08-19 MED ORDER — PALONOSETRON HCL INJECTION 0.25 MG/5ML
0.2500 mg | Freq: Once | INTRAVENOUS | Status: AC
  Administered 2019-08-19: 0.25 mg via INTRAVENOUS

## 2019-08-19 MED ORDER — SODIUM CHLORIDE 0.9 % IV SOLN
476.4000 mg | Freq: Once | INTRAVENOUS | Status: AC
  Administered 2019-08-19: 480 mg via INTRAVENOUS
  Filled 2019-08-19: qty 48

## 2019-08-19 MED ORDER — SODIUM CHLORIDE 0.9 % IV SOLN
Freq: Once | INTRAVENOUS | Status: AC
  Administered 2019-08-19: 13:00:00 via INTRAVENOUS
  Filled 2019-08-19: qty 5

## 2019-08-19 MED ORDER — PALONOSETRON HCL INJECTION 0.25 MG/5ML
INTRAVENOUS | Status: AC
  Filled 2019-08-19: qty 5

## 2019-08-19 MED ORDER — TRASTUZUMAB-ANNS CHEMO 150 MG IV SOLR
750.0000 mg | Freq: Once | INTRAVENOUS | Status: AC
  Administered 2019-08-19: 09:00:00 750 mg via INTRAVENOUS
  Filled 2019-08-19: qty 35.72

## 2019-08-19 MED ORDER — SODIUM CHLORIDE 0.9 % IV SOLN
420.0000 mg | Freq: Once | INTRAVENOUS | Status: AC
  Administered 2019-08-19: 420 mg via INTRAVENOUS
  Filled 2019-08-19: qty 14

## 2019-08-19 MED ORDER — SODIUM CHLORIDE 0.9% FLUSH
10.0000 mL | INTRAVENOUS | Status: DC | PRN
  Administered 2019-08-19: 10 mL
  Filled 2019-08-19: qty 10

## 2019-08-19 MED ORDER — DIPHENHYDRAMINE HCL 25 MG PO CAPS
50.0000 mg | ORAL_CAPSULE | Freq: Once | ORAL | Status: AC
  Administered 2019-08-19: 50 mg via ORAL

## 2019-08-19 NOTE — Patient Instructions (Signed)
[facility] Discharge Instructions for Patients Receiving Chemotherapy  Today you received the following chemotherapy agents: Trastuzumab-anns (Kanjinti), Pertuzumab (Perjeta), Docetaxel (Taxotere), and Carboplatin (Paraplatin)  To help prevent nausea and vomiting after your treatment, we encourage you to take your nausea medication as directed. Received Aloxi during your treatment today. For the next 3 days take your Compazine prescription (not your Zofran prescription) as needed for nausea. You can add Zofran into your regimen starting Friday as needed.   If you develop nausea and vomiting that is not controlled by your nausea medication, call the clinic.   BELOW ARE SYMPTOMS THAT SHOULD BE REPORTED IMMEDIATELY:  *FEVER GREATER THAN 100.5 F  *CHILLS WITH OR WITHOUT FEVER  NAUSEA AND VOMITING THAT IS NOT CONTROLLED WITH YOUR NAUSEA MEDICATION  *UNUSUAL SHORTNESS OF BREATH  *UNUSUAL BRUISING OR BLEEDING  TENDERNESS IN MOUTH AND THROAT WITH OR WITHOUT PRESENCE OF ULCERS  *URINARY PROBLEMS  *BOWEL PROBLEMS  UNUSUAL RASH Items with * indicate a potential emergency and should be followed up as soon as possible.  Feel free to call the clinic should you have any questions or concerns. The clinic phone number is [phone].  Please show the [facility] at check-in to the Emergency Department and triage nurse.  Trastuzumab injection for infusion What is this medicine? TRASTUZUMAB (tras TOO zoo mab) is a monoclonal antibody. It is used to treat breast cancer and stomach cancer. This medicine may be used for other purposes; ask your health care provider or pharmacist if you have questions. COMMON BRAND NAME(S): Herceptin, Galvin Proffer, Trazimera What should I tell my health care provider before I take this medicine? They need to know if you have any of these conditions:  heart disease  heart failure  lung or breathing disease, like  asthma  an unusual or allergic reaction to trastuzumab, benzyl alcohol, or other medications, foods, dyes, or preservatives  pregnant or trying to get pregnant  breast-feeding How should I use this medicine? This drug is given as an infusion into a vein. It is administered in a hospital or clinic by a specially trained health care professional. Talk to your pediatrician regarding the use of this medicine in children. This medicine is not approved for use in children. Overdosage: If you think you have taken too much of this medicine contact a poison control center or emergency room at once. NOTE: This medicine is only for you. Do not share this medicine with others. What if I miss a dose? It is important not to miss a dose. Call your doctor or health care professional if you are unable to keep an appointment. What may interact with this medicine? This medicine may interact with the following medications:  certain types of chemotherapy, such as daunorubicin, doxorubicin, epirubicin, and idarubicin This list may not describe all possible interactions. Give your health care provider a list of all the medicines, herbs, non-prescription drugs, or dietary supplements you use. Also tell them if you smoke, drink alcohol, or use illegal drugs. Some items may interact with your medicine. What should I watch for while using this medicine? Visit your doctor for checks on your progress. Report any side effects. Continue your course of treatment even though you feel ill unless your doctor tells you to stop. Call your doctor or health care professional for advice if you get a fever, chills or sore throat, or other symptoms of a cold or flu. Do not treat yourself. Try to avoid being around  people who are sick. You may experience fever, chills and shaking during your first infusion. These effects are usually mild and can be treated with other medicines. Report any side effects during the infusion to your health  care professional. Fever and chills usually do not happen with later infusions. Do not become pregnant while taking this medicine or for 7 months after stopping it. Women should inform their doctor if they wish to become pregnant or think they might be pregnant. Women of child-bearing potential will need to have a negative pregnancy test before starting this medicine. There is a potential for serious side effects to an unborn child. Talk to your health care professional or pharmacist for more information. Do not breast-feed an infant while taking this medicine or for 7 months after stopping it. Women must use effective birth control with this medicine. What side effects may I notice from receiving this medicine? Side effects that you should report to your doctor or health care professional as soon as possible:  allergic reactions like skin rash, itching or hives, swelling of the face, lips, or tongue  chest pain or palpitations  cough  dizziness  feeling faint or lightheaded, falls  fever  general ill feeling or flu-like symptoms  signs of worsening heart failure like breathing problems; swelling in your legs and feet  unusually weak or tired Side effects that usually do not require medical attention (report to your doctor or health care professional if they continue or are bothersome):  bone pain  changes in taste  diarrhea  joint pain  nausea/vomiting  weight loss This list may not describe all possible side effects. Call your doctor for medical advice about side effects. You may report side effects to FDA at 1-800-FDA-1088. Where should I keep my medicine? This drug is given in a hospital or clinic and will not be stored at home. NOTE: This sheet is a summary. It may not cover all possible information. If you have questions about this medicine, talk to your doctor, pharmacist, or health care provider.  2020 Elsevier/Gold Standard (2016-09-12 14:37:52)  Pertuzumab  injection What is this medicine? PERTUZUMAB (per TOOZ ue mab) is a monoclonal antibody. It is used to treat breast cancer. This medicine may be used for other purposes; ask your health care provider or pharmacist if you have questions. COMMON BRAND NAME(S): PERJETA What should I tell my health care provider before I take this medicine? They need to know if you have any of these conditions:  heart disease  heart failure  high blood pressure  history of irregular heart beat  recent or ongoing radiation therapy  an unusual or allergic reaction to pertuzumab, other medicines, foods, dyes, or preservatives  pregnant or trying to get pregnant  breast-feeding How should I use this medicine? This medicine is for infusion into a vein. It is given by a health care professional in a hospital or clinic setting. Talk to your pediatrician regarding the use of this medicine in children. Special care may be needed. Overdosage: If you think you have taken too much of this medicine contact a poison control center or emergency room at once. NOTE: This medicine is only for you. Do not share this medicine with others. What if I miss a dose? It is important not to miss your dose. Call your doctor or health care professional if you are unable to keep an appointment. What may interact with this medicine? Interactions are not expected. Give your health care provider a list  of all the medicines, herbs, non-prescription drugs, or dietary supplements you use. Also tell them if you smoke, drink alcohol, or use illegal drugs. Some items may interact with your medicine. This list may not describe all possible interactions. Give your health care provider a list of all the medicines, herbs, non-prescription drugs, or dietary supplements you use. Also tell them if you smoke, drink alcohol, or use illegal drugs. Some items may interact with your medicine. What should I watch for while using this medicine? Your  condition will be monitored carefully while you are receiving this medicine. Report any side effects. Continue your course of treatment even though you feel ill unless your doctor tells you to stop. Do not become pregnant while taking this medicine or for 7 months after stopping it. Women should inform their doctor if they wish to become pregnant or think they might be pregnant. Women of child-bearing potential will need to have a negative pregnancy test before starting this medicine. There is a potential for serious side effects to an unborn child. Talk to your health care professional or pharmacist for more information. Do not breast-feed an infant while taking this medicine or for 7 months after stopping it. Women must use effective birth control with this medicine. Call your doctor or health care professional for advice if you get a fever, chills or sore throat, or other symptoms of a cold or flu. Do not treat yourself. Try to avoid being around people who are sick. You may experience fever, chills, and headache during the infusion. Report any side effects during the infusion to your health care professional. What side effects may I notice from receiving this medicine? Side effects that you should report to your doctor or health care professional as soon as possible:  breathing problems  chest pain or palpitations  dizziness  feeling faint or lightheaded  fever or chills  skin rash, itching or hives  sore throat  swelling of the face, lips, or tongue  swelling of the legs or ankles  unusually weak or tired Side effects that usually do not require medical attention (report to your doctor or health care professional if they continue or are bothersome):  diarrhea  hair loss  nausea, vomiting  tiredness This list may not describe all possible side effects. Call your doctor for medical advice about side effects. You may report side effects to FDA at 1-800-FDA-1088. Where should I  keep my medicine? This drug is given in a hospital or clinic and will not be stored at home. NOTE: This sheet is a summary. It may not cover all possible information. If you have questions about this medicine, talk to your doctor, pharmacist, or health care provider.  2020 Elsevier/Gold Standard (2015-10-21 12:08:50)  Docetaxel injection What is this medicine? DOCETAXEL (doe se TAX el) is a chemotherapy drug. It targets fast dividing cells, like cancer cells, and causes these cells to die. This medicine is used to treat many types of cancers like breast cancer, certain stomach cancers, head and neck cancer, lung cancer, and prostate cancer. This medicine may be used for other purposes; ask your health care provider or pharmacist if you have questions. COMMON BRAND NAME(S): Docefrez, Taxotere What should I tell my health care provider before I take this medicine? They need to know if you have any of these conditions:  infection (especially a virus infection such as chickenpox, cold sores, or herpes)  liver disease  low blood counts, like low white cell, platelet, or red  cell counts  an unusual or allergic reaction to docetaxel, polysorbate 80, other chemotherapy agents, other medicines, foods, dyes, or preservatives  pregnant or trying to get pregnant  breast-feeding How should I use this medicine? This drug is given as an infusion into a vein. It is administered in a hospital or clinic by a specially trained health care professional. Talk to your pediatrician regarding the use of this medicine in children. Special care may be needed. Overdosage: If you think you have taken too much of this medicine contact a poison control center or emergency room at once. NOTE: This medicine is only for you. Do not share this medicine with others. What if I miss a dose? It is important not to miss your dose. Call your doctor or health care professional if you are unable to keep an appointment. What  may interact with this medicine?  aprepitant  certain antibiotics like erythromycin or clarithromycin  certain antivirals for HIV or hepatitis  certain medicines for fungal infections like fluconazole, itraconazole, ketoconazole, posaconazole, or voriconazole  cimetidine  ciprofloxacin  conivaptan  cyclosporine  dronedarone  fluvoxamine  grapefruit juice  imatinib  verapamil This list may not describe all possible interactions. Give your health care provider a list of all the medicines, herbs, non-prescription drugs, or dietary supplements you use. Also tell them if you smoke, drink alcohol, or use illegal drugs. Some items may interact with your medicine. What should I watch for while using this medicine? Your condition will be monitored carefully while you are receiving this medicine. You will need important blood work done while you are taking this medicine. Call your doctor or health care professional for advice if you get a fever, chills or sore throat, or other symptoms of a cold or flu. Do not treat yourself. This drug decreases your body's ability to fight infections. Try to avoid being around people who are sick. Some products may contain alcohol. Ask your health care professional if this medicine contains alcohol. Be sure to tell all health care professionals you are taking this medicine. Certain medicines, like metronidazole and disulfiram, can cause an unpleasant reaction when taken with alcohol. The reaction includes flushing, headache, nausea, vomiting, sweating, and increased thirst. The reaction can last from 30 minutes to several hours. You may get drowsy or dizzy. Do not drive, use machinery, or do anything that needs mental alertness until you know how this medicine affects you. Do not stand or sit up quickly, especially if you are an older patient. This reduces the risk of dizzy or fainting spells. Alcohol may interfere with the effect of this medicine. Talk to  your health care professional about your risk of cancer. You may be more at risk for certain types of cancer if you take this medicine. Do not become pregnant while taking this medicine or for 6 months after stopping it. Women should inform their doctor if they wish to become pregnant or think they might be pregnant. There is a potential for serious side effects to an unborn child. Talk to your health care professional or pharmacist for more information. Do not breast-feed an infant while taking this medicine or for 1 to 2 weeks after stopping it. Males who get this medicine must use a condom during sex with females who can get pregnant. If you get a woman pregnant, the baby could have birth defects. The baby could die before they are born. You will need to continue wearing a condom for 3 months after stopping the  medicine. Tell your health care provider right away if your partner becomes pregnant while you are taking this medicine. This may interfere with the ability to father a child. You should talk to your doctor or health care professional if you are concerned about your fertility. What side effects may I notice from receiving this medicine? Side effects that you should report to your doctor or health care professional as soon as possible:  allergic reactions like skin rash, itching or hives, swelling of the face, lips, or tongue  blurred vision  breathing problems  changes in vision  low blood counts - This drug may decrease the number of white blood cells, red blood cells and platelets. You may be at increased risk for infections and bleeding.  nausea and vomiting  pain, redness or irritation at site where injected  pain, tingling, numbness in the hands or feet  redness, blistering, peeling, or loosening of the skin, including inside the mouth  signs of decreased platelets or bleeding - bruising, pinpoint red spots on the skin, black, tarry stools, nosebleeds  signs of decreased red  blood cells - unusually weak or tired, fainting spells, lightheadedness  signs of infection - fever or chills, cough, sore throat, pain or difficulty passing urine  swelling of the ankle, feet, hands Side effects that usually do not require medical attention (report to your doctor or health care professional if they continue or are bothersome):  constipation  diarrhea  fingernail or toenail changes  hair loss  loss of appetite  mouth sores  muscle pain This list may not describe all possible side effects. Call your doctor for medical advice about side effects. You may report side effects to FDA at 1-800-FDA-1088. Where should I keep my medicine? This drug is given in a hospital or clinic and will not be stored at home. NOTE: This sheet is a summary. It may not cover all possible information. If you have questions about this medicine, talk to your doctor, pharmacist, or health care provider.  2020 Elsevier/Gold Standard (2018-11-22 12:23:11)  Carboplatin injection What is this medicine? CARBOPLATIN (KAR boe pla tin) is a chemotherapy drug. It targets fast dividing cells, like cancer cells, and causes these cells to die. This medicine is used to treat ovarian cancer and many other cancers. This medicine may be used for other purposes; ask your health care provider or pharmacist if you have questions. COMMON BRAND NAME(S): Paraplatin What should I tell my health care provider before I take this medicine? They need to know if you have any of these conditions:  blood disorders  hearing problems  kidney disease  recent or ongoing radiation therapy  an unusual or allergic reaction to carboplatin, cisplatin, other chemotherapy, other medicines, foods, dyes, or preservatives  pregnant or trying to get pregnant  breast-feeding How should I use this medicine? This drug is usually given as an infusion into a vein. It is administered in a hospital or clinic by a specially trained  health care professional. Talk to your pediatrician regarding the use of this medicine in children. Special care may be needed. Overdosage: If you think you have taken too much of this medicine contact a poison control center or emergency room at once. NOTE: This medicine is only for you. Do not share this medicine with others. What if I miss a dose? It is important not to miss a dose. Call your doctor or health care professional if you are unable to keep an appointment. What may interact with  this medicine?  medicines for seizures  medicines to increase blood counts like filgrastim, pegfilgrastim, sargramostim  some antibiotics like amikacin, gentamicin, neomycin, streptomycin, tobramycin  vaccines Talk to your doctor or health care professional before taking any of these medicines:  acetaminophen  aspirin  ibuprofen  ketoprofen  naproxen This list may not describe all possible interactions. Give your health care provider a list of all the medicines, herbs, non-prescription drugs, or dietary supplements you use. Also tell them if you smoke, drink alcohol, or use illegal drugs. Some items may interact with your medicine. What should I watch for while using this medicine? Your condition will be monitored carefully while you are receiving this medicine. You will need important blood work done while you are taking this medicine. This drug may make you feel generally unwell. This is not uncommon, as chemotherapy can affect healthy cells as well as cancer cells. Report any side effects. Continue your course of treatment even though you feel ill unless your doctor tells you to stop. In some cases, you may be given additional medicines to help with side effects. Follow all directions for their use. Call your doctor or health care professional for advice if you get a fever, chills or sore throat, or other symptoms of a cold or flu. Do not treat yourself. This drug decreases your body's ability  to fight infections. Try to avoid being around people who are sick. This medicine may increase your risk to bruise or bleed. Call your doctor or health care professional if you notice any unusual bleeding. Be careful brushing and flossing your teeth or using a toothpick because you may get an infection or bleed more easily. If you have any dental work done, tell your dentist you are receiving this medicine. Avoid taking products that contain aspirin, acetaminophen, ibuprofen, naproxen, or ketoprofen unless instructed by your doctor. These medicines may hide a fever. Do not become pregnant while taking this medicine. Women should inform their doctor if they wish to become pregnant or think they might be pregnant. There is a potential for serious side effects to an unborn child. Talk to your health care professional or pharmacist for more information. Do not breast-feed an infant while taking this medicine. What side effects may I notice from receiving this medicine? Side effects that you should report to your doctor or health care professional as soon as possible:  allergic reactions like skin rash, itching or hives, swelling of the face, lips, or tongue  signs of infection - fever or chills, cough, sore throat, pain or difficulty passing urine  signs of decreased platelets or bleeding - bruising, pinpoint red spots on the skin, black, tarry stools, nosebleeds  signs of decreased red blood cells - unusually weak or tired, fainting spells, lightheadedness  breathing problems  changes in hearing  changes in vision  chest pain  high blood pressure  low blood counts - This drug may decrease the number of white blood cells, red blood cells and platelets. You may be at increased risk for infections and bleeding.  nausea and vomiting  pain, swelling, redness or irritation at the injection site  pain, tingling, numbness in the hands or feet  problems with balance, talking, walking  trouble  passing urine or change in the amount of urine Side effects that usually do not require medical attention (report to your doctor or health care professional if they continue or are bothersome):  hair loss  loss of appetite  metallic taste in the mouth  or changes in taste This list may not describe all possible side effects. Call your doctor for medical advice about side effects. You may report side effects to FDA at 1-800-FDA-1088. Where should I keep my medicine? This drug is given in a hospital or clinic and will not be stored at home. NOTE: This sheet is a summary. It may not cover all possible information. If you have questions about this medicine, talk to your doctor, pharmacist, or health care provider.  2020 Elsevier/Gold Standard (2007-12-24 14:38:05)  Coronavirus (COVID-19) Are you at risk?  Are you at risk for the Coronavirus (COVID-19)?  To be considered HIGH RISK for Coronavirus (COVID-19), you have to meet the following criteria:  . Traveled to [place], [place], [place], [place] or [place]; or in the [place] to [place], [place], [place], or [place]; and have fever, cough, and shortness of breath within the last 2 weeks of travel OR . Been in close contact with a person diagnosed with COVID-19 within the last 2 weeks and have fever, cough, and shortness of breath . IF YOU DO NOT MEET THESE CRITERIA, YOU ARE CONSIDERED LOW RISK FOR COVID-19.  What to do if you are HIGH RISK for COVID-19?  Marland Kitchen If you are having a medical emergency, call 911. . Seek medical care right away. Before you go to a doctor's office, urgent care or emergency department, call ahead and tell them about your recent travel, contact with someone diagnosed with COVID-19, and your symptoms. You should receive instructions from your physician's office regarding next steps of care.  . When you arrive at healthcare provider, tell the healthcare staff immediately you have returned from visiting [place],  [place], [place], [place] or [place]; or traveled in the [place] to [place], [place], [place], or [place]; in the last two weeks or you have been in close contact with a person diagnosed with COVID-19 in the last 2 weeks.   . Tell the health care staff about your symptoms: fever, cough and shortness of breath. . After you have been seen by a medical provider, you will be either: o Tested for (COVID-19) and discharged home on quarantine except to seek medical care if symptoms worsen, and asked to  - Stay home and avoid contact with others until you get your results (4-5 days)  - Avoid travel on public transportation if possible (such as bus, train, or airplane) or o Sent to the Emergency Department by EMS for evaluation, COVID-19 testing, and possible admission depending on your condition and test results.  What to do if you are LOW RISK for COVID-19?  Reduce your risk of any infection by using the same precautions used for avoiding the common cold or flu:  Marland Kitchen Wash your hands often with soap and warm water for at least 20 seconds.  If soap and water are not readily available, use an alcohol-based hand sanitizer with at least 60% alcohol.  . If coughing or sneezing, cover your mouth and nose by coughing or sneezing into the elbow areas of your shirt or coat, into a tissue or into your sleeve (not your hands). . Avoid shaking hands with others and consider head nods or verbal greetings only. . Avoid touching your eyes, nose, or mouth with unwashed hands.  . Avoid close contact with people who are sick. . Avoid places or events with large numbers of people in one location, like concerts or sporting events. . Carefully consider travel plans you have  or are making. . If you are planning any travel outside or inside the [place], visit the CDC's Travelers' Health webpage for the latest health notices. . If you have some symptoms but not all symptoms, continue to monitor at home and seek medical  attention if your symptoms worsen. . If you are having a medical emergency, call 911.   [place] / e-Visit: [email or web address]         [facility]: [facility]: [phone]                   [facility]: [phone]

## 2019-08-19 NOTE — Progress Notes (Signed)
Patient alerted to potential drug-drug interactions of emend and dexamethasone with her warfarin and that she may need more frequent INR checks. I also sent provider who manages her warfarin an inbasket message alerting her to her starting treatments here. Her PCP would like her get INRs drawn here to avoid trips - their [facility] will continue to manage patient's warfarin dosing based on INRs.  Demetrius Charity, PharmD, [facility] Phone: [phone] 08/19/2019

## 2019-08-20 ENCOUNTER — Telehealth: Payer: Self-pay | Admitting: *Deleted

## 2019-08-20 NOTE — Telephone Encounter (Signed)
Called pt to assess needs after received 1st TCHP on 08/19/19. Relate doing well, no n/v, some fatigue. Taking anti-nausea medications. Denies questions or concerns at this time. Encourage pt to call with needs.

## 2019-08-21 ENCOUNTER — Telehealth: Payer: Self-pay | Admitting: *Deleted

## 2019-08-21 ENCOUNTER — Other Ambulatory Visit: Payer: Self-pay

## 2019-08-21 ENCOUNTER — Inpatient Hospital Stay: Payer: Medicare Other

## 2019-08-21 ENCOUNTER — Ambulatory Visit (INDEPENDENT_AMBULATORY_CARE_PROVIDER_SITE_OTHER): Payer: Medicare Other | Admitting: General Practice

## 2019-08-21 VITALS — BP 124/64 | HR 58 | Temp 98.2°F

## 2019-08-21 DIAGNOSIS — R439 Unspecified disturbances of smell and taste: Secondary | ICD-10-CM | POA: Diagnosis not present

## 2019-08-21 DIAGNOSIS — Z5111 Encounter for antineoplastic chemotherapy: Secondary | ICD-10-CM | POA: Diagnosis not present

## 2019-08-21 DIAGNOSIS — C50812 Malignant neoplasm of overlapping sites of left female breast: Secondary | ICD-10-CM | POA: Diagnosis not present

## 2019-08-21 DIAGNOSIS — N2889 Other specified disorders of kidney and ureter: Secondary | ICD-10-CM | POA: Diagnosis not present

## 2019-08-21 DIAGNOSIS — Z171 Estrogen receptor negative status [ER-]: Secondary | ICD-10-CM

## 2019-08-21 DIAGNOSIS — Z7901 Long term (current) use of anticoagulants: Secondary | ICD-10-CM | POA: Diagnosis not present

## 2019-08-21 DIAGNOSIS — Z5189 Encounter for other specified aftercare: Secondary | ICD-10-CM | POA: Diagnosis not present

## 2019-08-21 DIAGNOSIS — C773 Secondary and unspecified malignant neoplasm of axilla and upper limb lymph nodes: Secondary | ICD-10-CM | POA: Diagnosis not present

## 2019-08-21 DIAGNOSIS — R197 Diarrhea, unspecified: Secondary | ICD-10-CM | POA: Diagnosis not present

## 2019-08-21 DIAGNOSIS — Z79899 Other long term (current) drug therapy: Secondary | ICD-10-CM | POA: Diagnosis not present

## 2019-08-21 DIAGNOSIS — Z9012 Acquired absence of left breast and nipple: Secondary | ICD-10-CM | POA: Diagnosis not present

## 2019-08-21 DIAGNOSIS — Z800 Family history of malignant neoplasm of digestive organs: Secondary | ICD-10-CM | POA: Diagnosis not present

## 2019-08-21 DIAGNOSIS — N289 Disorder of kidney and ureter, unspecified: Secondary | ICD-10-CM | POA: Diagnosis not present

## 2019-08-21 DIAGNOSIS — Z87891 Personal history of nicotine dependence: Secondary | ICD-10-CM | POA: Diagnosis not present

## 2019-08-21 MED ORDER — PEGFILGRASTIM-CBQV 6 MG/0.6ML ~~LOC~~ SOSY
PREFILLED_SYRINGE | SUBCUTANEOUS | Status: AC
  Filled 2019-08-21: qty 0.6

## 2019-08-21 MED ORDER — PEGFILGRASTIM-CBQV 6 MG/0.6ML ~~LOC~~ SOSY
6.0000 mg | PREFILLED_SYRINGE | Freq: Once | SUBCUTANEOUS | Status: AC
  Administered 2019-08-21: 6 mg via SUBCUTANEOUS

## 2019-08-21 NOTE — Patient Instructions (Signed)

## 2019-08-21 NOTE — Telephone Encounter (Signed)
Chemo F/u call was done by Navigator.

## 2019-08-21 NOTE — Telephone Encounter (Signed)
-----   Message from Zola Button, RN sent at 08/19/2019  3:33 PM EST ----- Regarding: Dr. Lindi Adie; First time f/u call Patient received first time [facility] on 08/19/19. Tolerated well. Returning 08/21/19 at 10am for injection. Thank you!

## 2019-08-21 NOTE — Patient Instructions (Addendum)
Pre visit review using our clinic review tool, if applicable. No additional management support is needed unless otherwise documented below in the visit note.  Spoke with patient today and asked her to take 1 1/2 tablets today and tomorrow.  On Saturday  continue to take  1 (5 mg) tablet daily except take 1/2 tablet on Wednesdays.   Patient will be having labs at the cancer center on Tuesday the 24th.

## 2019-08-25 NOTE — Progress Notes (Addendum)
Patient Care Team: Panosh, Standley Brooking, MD as PCP - General Stanford Breed, Denice Bors, MD (Cardiology) Susa Day, MD (Orthopedic Surgery) Mauro Kaufmann, RN as Oncology Nurse Navigator Rockwell Germany, RN as Oncology Nurse Navigator  DIAGNOSIS:    ICD-10-CM   1. Malignant neoplasm of overlapping sites of left breast in female, estrogen receptor negative ([place])  C50.812    Z17.1     SUMMARY OF ONCOLOGIC HISTORY: Oncology History  Malignant neoplasm of overlapping sites of left breast in female, estrogen receptor negative ([place])  08/01/2019 Initial Diagnosis   Patient palpated left breast lump and skin thickening. Mammogram showed a 3.6cm mass at 3:30 position, a 0.9cm mass at 3:00 position, a 0.7cm mass at 2:00 position, a 0.5cm mass at 2:00 position, a 0.3cm mass at 1:00 position, and a 0.4cm mass at 1:00 position, with 4 abnormal left axillary lymph nodes. Biopsy showed IDC, grade 3, HER-2 + (3+), ER/PR -, Ki67 70%, in the breast and lymph nodes.    08/06/2019 Cancer Staging   Staging form: Breast, AJCC 8th Edition - Clinical: Stage IIIB (cT4, cN1, cM0, G3, ER-, PR-, HER2+) - Signed by Nicholas Lose, MD on 08/06/2019   08/19/2019 -  Chemotherapy   The patient had palonosetron (ALOXI) injection 0.25 mg, 0.25 mg, Intravenous,  Once, 1 of 6 cycles Administration: 0.25 mg (08/19/2019) pegfilgrastim-cbqv (UDENYCA) injection 6 mg, 6 mg, Subcutaneous, Once, 1 of 6 cycles Administration: 6 mg (08/21/2019) CARBOplatin (PARAPLATIN) 480 mg in sodium chloride 0.9 % 250 mL chemo infusion, 480 mg (110.9 % of original dose 429.6 mg), Intravenous,  Once, 1 of 6 cycles Dose modification:   (original dose 429.6 mg, Cycle 1) Administration: 480 mg (08/19/2019) DOCEtaxel (TAXOTERE) 160 mg in sodium chloride 0.9 % 250 mL chemo infusion, 75 mg/m2 = 160 mg, Intravenous,  Once, 1 of 6 cycles Administration: 160 mg (08/19/2019) pertuzumab (PERJETA) 420 mg in sodium chloride 0.9 % 250 mL chemo infusion, 420 mg  (100 % of original dose 420 mg), Intravenous, Once, 1 of 6 cycles Dose modification: 420 mg (original dose 420 mg, Cycle 1, Reason: Provider Judgment) Administration: 420 mg (08/19/2019) fosaprepitant (EMEND) 150 mg, dexamethasone (DECADRON) 12 mg in sodium chloride 0.9 % 145 mL IVPB, , Intravenous,  Once, 1 of 6 cycles Administration:  (08/19/2019) trastuzumab-anns (KANJINTI) 750 mg in sodium chloride 0.9 % 250 mL chemo infusion, 756 mg (100 % of original dose 8 mg/kg), Intravenous,  Once, 1 of 6 cycles Dose modification: 8 mg/kg (original dose 8 mg/kg, Cycle 1, Reason: Other (see comments), Comment: change to approved brand), 6 mg/kg (original dose 6 mg/kg, Cycle 2, Reason: Other (see comments), Comment: brand change per insurance) Administration: 750 mg (08/19/2019)  for chemotherapy treatment.      CHIEF COMPLIANT: Cycle 1 Day 8 TCH Perjeta  INTERVAL HISTORY: [name] Foster is a 68 y.o. with above-mentioned history of left breast cancer. She is currently on neoadjuvant chemotherapy with [place]. Her port was placed by Dr. Barry Dienes on 08/18/19. She presents to the clinic today for a toxicity check.  She did great for the first 3 days but from fourth day to seventh day she felt extremely tired.  The last couple of days she is starting to feel better.  She had 2 episodes of loose stools.  One episode of nausea.  She has poor taste and appetite.  REVIEW OF SYSTEMS:   Constitutional: Denies fevers, chills or abnormal weight loss Eyes: Denies blurriness of vision Ears, nose, mouth, throat, and  face: Poor taste and appetite Respiratory: Denies cough, dyspnea or wheezes Cardiovascular: Denies palpitation, chest discomfort Gastrointestinal: 2 episodes of loose stools for which she took Imodium. Skin: Denies abnormal skin rashes Lymphatics: Denies new lymphadenopathy or easy bruising Neurological: Denies numbness, tingling or new weaknesses Behavioral/Psych: Mood is stable, no new changes   Extremities: No lower extremity edema Breast: denies any pain or lumps or nodules in either breasts All other systems were reviewed with the patient and are negative.  I have reviewed the past medical history, past surgical history, social history and family history with the patient and they are unchanged from previous note.  ALLERGIES:  is allergic to penicillins; tetanus toxoid; and aspirin.  MEDICATIONS:  Current Outpatient Medications  Medication Sig Dispense Refill  . Albuterol Sulfate (PROAIR RESPICLICK) 485 (90 Base) MCG/ACT AEPB Inhale 2 puffs into the lungs every 6 (six) hours as needed. (Patient taking differently: Inhale 2 puffs into the lungs every 6 (six) hours as needed (SOB / wheezing). ) 2 each 1  . amLODipine (NORVASC) 10 MG tablet TAKE 1 TABLET BY MOUTH  DAILY 90 tablet 3  . atorvastatin (LIPITOR) 20 MG tablet TAKE 1 TABLET BY MOUTH  DAILY 90 tablet 1  . Carboxymethylcellul-Glycerin (CLEAR EYES FOR DRY EYES) 1-0.25 % SOLN Place 1 drop into both eyes 2 (two) times daily as needed (dry eyes).    . Cholecalciferol (VITAMIN D) 50 MCG (2000 UT) tablet Take 2,000 Units by mouth daily.    Marland Kitchen dexamethasone (DECADRON) 4 MG tablet Take 1 tablet (4 mg total) by mouth 2 (two) times daily. Take 1 tablet day before chemo and 1 tablet day after chemo with food 12 tablet 0  . fexofenadine (ALLEGRA) 180 MG tablet Take 180 mg by mouth daily as needed for allergies.     . fluticasone (FLONASE) 50 MCG/ACT nasal spray USE 2 SPRAYS IN EACH NOSTRIL DAILY. PT NEEDS TO SCHEDULE A FOLLOW UP APPT BEFORE NEXT REFILL. (Patient taking differently: Place 2 sprays into both nostrils daily. ) 16 g 0  . furosemide (LASIX) 40 MG tablet TAKE 1 TABLET BY MOUTH  DAILY 90 tablet 1  . hydrALAZINE (APRESOLINE) 50 MG tablet TAKE 1 TABLET BY MOUTH 3  TIMES DAILY 270 tablet 3  . lidocaine-prilocaine (EMLA) cream Apply to affected area once 30 g 3  . LORazepam (ATIVAN) 0.5 MG tablet Take 1 tablet (0.5 mg total) by mouth  at bedtime as needed for sleep. 30 tablet 0  . metoprolol tartrate (LOPRESSOR) 50 MG tablet TAKE 1 TABLET BY MOUTH  TWICE DAILY 180 tablet 1  . ondansetron (ZOFRAN) 8 MG tablet Take 1 tablet (8 mg total) by mouth 2 (two) times daily as needed (Nausea or vomiting). Begin 4 days after chemotherapy. 30 tablet 1  . oxyCODONE (OXY IR/ROXICODONE) 5 MG immediate release tablet Take 1 tablet (5 mg total) by mouth every 6 (six) hours as needed for severe pain. 8 tablet 0  . potassium chloride SA (KLOR-CON) 20 MEQ tablet TAKE 1 TABLET BY MOUTH  DAILY 90 tablet 3  . prochlorperazine (COMPAZINE) 10 MG tablet Take 1 tablet (10 mg total) by mouth every 6 (six) hours as needed (Nausea or vomiting). 30 tablet 1  . ramipril (ALTACE) 10 MG capsule TAKE 1 CAPSULE BY MOUTH  DAILY 90 capsule 1  . sodium chloride (OCEAN) 0.65 % SOLN nasal spray Place 1 spray into both nostrils as needed for congestion.    Marland Kitchen warfarin (COUMADIN) 5 MG tablet Take 1 tablet  daily or as directed by anticoagulation clinic. (Patient taking differently: Take 2.5-5 mg by mouth See admin instructions. Take 1 tablet daily or as directed by anticoagulation clinic; 5 mg all days except taking 2.5 mg on Wed) 100 tablet 1   No current facility-administered medications for this visit.     PHYSICAL EXAMINATION: ECOG PERFORMANCE STATUS: 1 - Symptomatic but completely ambulatory  Vitals:   08/26/19 1500  BP: 131/65  Pulse: 70  Resp: 18  Temp: 98 F (36.7 C)  SpO2: 100%   Filed Weights   08/26/19 1500  Weight: 203 lb 1.6 oz (92.1 kg)    GENERAL: alert, no distress and comfortable SKIN: skin color, texture, turgor are normal, no rashes or significant lesions EYES: normal, Conjunctiva are pink and non-injected, sclera clear OROPHARYNX: no exudate, no erythema and lips, buccal mucosa, and tongue normal  NECK: supple, thyroid normal size, non-tender, without nodularity LYMPH: no palpable lymphadenopathy in the cervical, axillary or inguinal  LUNGS: clear to auscultation and percussion with normal breathing effort HEART: regular rate & rhythm and no murmurs and no lower extremity edema ABDOMEN: abdomen soft, non-tender and normal bowel sounds MUSCULOSKELETAL: no cyanosis of digits and no clubbing  NEURO: alert & oriented x 3 with fluent speech, no focal motor/sensory deficits EXTREMITIES: No lower extremity edema  LABORATORY DATA:  I have reviewed the data as listed CMP Latest Ref Rng & Units 08/15/2019 08/06/2019 01/17/2019  Glucose 70 - 99 mg/dL 97 95 98  BUN 8 - 23 mg/dL _0 Creatinine 0.44 - 1.00 mg/dL 1.50(H) 1.75(H) 1.43(H)  Sodium 135 - 145 mmol/L 145 144 145  Potassium 3.5 - 5.1 mmol/L 3.9 3.9 4.2  Chloride 98 - 111 mmol/L 108 103 106  CO2 22 - 32 mmol/L _1 Calcium 8.9 - 10.3 mg/dL 9.1 9.3 8.8  Total Protein 6.5 - 8.1 g/dL 7.2 7.6 -  Total Bilirubin 0.3 - 1.2 mg/dL 0.6 0.4 -  Alkaline Phos 38 - 126 U/L 110 117 117  AST 15 - 41 U/L 18 19 -  ALT 0 - 44 U/L 17 18 -    Lab Results  Component Value Date   WBC 2.9 (L) 08/26/2019   HGB 12.0 08/26/2019   HCT 38.3 08/26/2019   MCV 91.4 08/26/2019   PLT 97 (L) 08/26/2019   NEUTROABS PENDING 08/26/2019    ASSESSMENT & PLAN:  Malignant neoplasm of overlapping sites of left breast in female, estrogen receptor negative ([place]) 08/01/2019:Patient palpated left breast lump and skin thickening. Mammogram showed a 3.6cm mass at 3:30 position, a 0.9cm mass at 3:00 position, a 0.7cm mass at 2:00 position, a 0.5cm mass at 2:00 position, a 0.3cm mass at 1:00 position, and a 0.4cm mass at 1:00 position, with 4 abnormal left axillary lymph nodes. Biopsy showed IDC, grade 3, HER-2 + (3+), ER/PR -, Ki67 70%, in the breast and lymph nodes.  Treatment plan  1. Neoadjuvant chemotherapy with TCH Perjeta 6 cycles followed by Herceptin Perjeta maintenance versus Kadcyla maintenance (depending on response to chemo) for 1 year 2. Followed by mastectomy with axillary lymph node  dissection 3. Followed by adjuvant radiation therapy   CT CAP 08/12/2019: 3.9 cm left breast mass with left axillary lymph nodes.  10 mm lesion upper pole of left kidney suspicious for small renal cell cancer.  3 mm left lower lobe nodule, 4 mm subcapsular lesion anterior hepatic dome too small Bone scan 08/15/2019: Benign Breast MRI 08/12/2019: Locally advanced left breast  cancer 4 cm, multiple irregular enhancing masses, diffuse skin thickening and enhancement and multiple pathogenic level 1 axillary lymph nodes at least 5-6 -------------------------------------------------------------------------------------------------------------------------------------------------- Current treatment: Cycle 1 day 8 TCH Perjeta Echocardiogram: 08/15/2019: EF 55% Chemo toxicities: 1.  Mild diarrhea: Takes Imodium as needed 2.  Loss of taste and decreased appetite: Patient is worried about not enjoying Thanksgiving food 3.  Fatigue   Renal mass: Abdominal MRI ordered, we will refer to urology.  Return to clinic in 2 weeks for cycle 2 TCHP   No orders of the defined types were placed in this encounter.  The patient has a good understanding of the overall plan. she agrees with it. she will call with any problems that may develop before the next visit here.  Nicholas Lose, MD 08/26/2019  Julious Oka Dorshimer, am acting as scribe for Dr. Nicholas Lose.  I have reviewed the above documentation for accuracy and completeness, and I agree with the above.    Addendum: ANC 0.1 today. We will reduce the dosage of next chemotherapy.

## 2019-08-26 ENCOUNTER — Inpatient Hospital Stay (HOSPITAL_BASED_OUTPATIENT_CLINIC_OR_DEPARTMENT_OTHER): Payer: Medicare Other | Admitting: Hematology and Oncology

## 2019-08-26 ENCOUNTER — Inpatient Hospital Stay: Payer: Medicare Other

## 2019-08-26 ENCOUNTER — Encounter: Payer: Self-pay | Admitting: Hematology and Oncology

## 2019-08-26 ENCOUNTER — Encounter: Payer: Self-pay | Admitting: *Deleted

## 2019-08-26 ENCOUNTER — Ambulatory Visit: Payer: Medicare Other

## 2019-08-26 ENCOUNTER — Other Ambulatory Visit: Payer: Self-pay

## 2019-08-26 ENCOUNTER — Telehealth: Payer: Self-pay | Admitting: Licensed Clinical Social Worker

## 2019-08-26 DIAGNOSIS — Z79899 Other long term (current) drug therapy: Secondary | ICD-10-CM | POA: Diagnosis not present

## 2019-08-26 DIAGNOSIS — N289 Disorder of kidney and ureter, unspecified: Secondary | ICD-10-CM | POA: Diagnosis not present

## 2019-08-26 DIAGNOSIS — Z5111 Encounter for antineoplastic chemotherapy: Secondary | ICD-10-CM | POA: Diagnosis not present

## 2019-08-26 DIAGNOSIS — Z800 Family history of malignant neoplasm of digestive organs: Secondary | ICD-10-CM | POA: Diagnosis not present

## 2019-08-26 DIAGNOSIS — Z171 Estrogen receptor negative status [ER-]: Secondary | ICD-10-CM

## 2019-08-26 DIAGNOSIS — Z5189 Encounter for other specified aftercare: Secondary | ICD-10-CM | POA: Diagnosis not present

## 2019-08-26 DIAGNOSIS — R197 Diarrhea, unspecified: Secondary | ICD-10-CM | POA: Diagnosis not present

## 2019-08-26 DIAGNOSIS — Z87891 Personal history of nicotine dependence: Secondary | ICD-10-CM | POA: Diagnosis not present

## 2019-08-26 DIAGNOSIS — Z9012 Acquired absence of left breast and nipple: Secondary | ICD-10-CM | POA: Diagnosis not present

## 2019-08-26 DIAGNOSIS — R439 Unspecified disturbances of smell and taste: Secondary | ICD-10-CM | POA: Diagnosis not present

## 2019-08-26 DIAGNOSIS — C50812 Malignant neoplasm of overlapping sites of left female breast: Secondary | ICD-10-CM

## 2019-08-26 DIAGNOSIS — C773 Secondary and unspecified malignant neoplasm of axilla and upper limb lymph nodes: Secondary | ICD-10-CM | POA: Diagnosis not present

## 2019-08-26 DIAGNOSIS — N2889 Other specified disorders of kidney and ureter: Secondary | ICD-10-CM | POA: Diagnosis not present

## 2019-08-26 LAB — CMP (CANCER CENTER ONLY)
ALT: 19 U/L (ref 0–44)
AST: 17 U/L (ref 15–41)
Albumin: 3.6 g/dL (ref 3.5–5.0)
Alkaline Phosphatase: 106 U/L (ref 38–126)
Anion gap: 10 (ref 5–15)
BUN: 18 mg/dL (ref 8–23)
CO2: 26 mmol/L (ref 22–32)
Calcium: 8.5 mg/dL — ABNORMAL LOW (ref 8.9–10.3)
Chloride: 109 mmol/L (ref 98–111)
Creatinine: 1.57 mg/dL — ABNORMAL HIGH (ref 0.44–1.00)
GFR, Est AFR Am: 39 mL/min — ABNORMAL LOW (ref >60)
GFR, Estimated: 34 mL/min — ABNORMAL LOW (ref >60)
Glucose, Bld: 92 mg/dL (ref 70–99)
Potassium: 4.2 mmol/L (ref 3.5–5.1)
Sodium: 145 mmol/L (ref 135–145)
Total Bilirubin: 0.4 mg/dL (ref 0.3–1.2)
Total Protein: 6.6 g/dL (ref 6.5–8.1)

## 2019-08-26 LAB — CBC WITH DIFFERENTIAL (CANCER CENTER ONLY)
Abs Immature Granulocytes: 0 10*3/uL (ref 0.00–0.07)
Band Neutrophils: 1 %
Basophils Absolute: 0 10*3/uL (ref 0.0–0.1)
Basophils Relative: 1 %
Eosinophils Absolute: 0 10*3/uL (ref 0.0–0.5)
Eosinophils Relative: 1 %
HCT: 38.3 % (ref 36.0–46.0)
Hemoglobin: 12 g/dL (ref 12.0–15.0)
Lymphocytes Relative: 92 %
Lymphs Abs: 2.7 10*3/uL (ref 0.7–4.0)
MCH: 28.6 pg (ref 26.0–34.0)
MCHC: 31.3 g/dL (ref 30.0–36.0)
MCV: 91.4 fL (ref 80.0–100.0)
Monocytes Absolute: 0.1 10*3/uL (ref 0.1–1.0)
Monocytes Relative: 4 %
Neutro Abs: 0.1 10*3/uL — CL (ref 1.7–7.7)
Neutrophils Relative %: 1 %
Platelet Count: 97 10*3/uL — ABNORMAL LOW (ref 150–400)
RBC: 4.19 MIL/uL (ref 3.87–5.11)
RDW: 12.7 % (ref 11.5–15.5)
WBC Count: 2.9 10*3/uL — ABNORMAL LOW (ref 4.0–10.5)
nRBC: 0 % (ref 0.0–0.2)

## 2019-08-26 LAB — PROTIME-INR
INR: 2.1 — ABNORMAL HIGH (ref 0.8–1.2)
Prothrombin Time: 23.6 seconds — ABNORMAL HIGH (ref 11.4–15.2)

## 2019-08-26 NOTE — Assessment & Plan Note (Signed)
08/01/2019:Patient palpated left breast lump and skin thickening. Mammogram showed a 3.6cm mass at 3:30 position, a 0.9cm mass at 3:00 position, a 0.7cm mass at 2:00 position, a 0.5cm mass at 2:00 position, a 0.3cm mass at 1:00 position, and a 0.4cm mass at 1:00 position, with 4 abnormal left axillary lymph nodes. Biopsy showed IDC, grade 3, HER-2 + (3+), ER/PR -, Ki67 70%, in the breast and lymph nodes.  Treatment plan  1. Neoadjuvant chemotherapy with TCH Perjeta 6 cycles followed by Herceptin Perjeta maintenance versus Kadcyla maintenance (depending on response to chemo) for 1 year 2. Followed by mastectomy with axillary lymph node dissection 3. Followed by adjuvant radiation therapy   CT CAP 08/12/2019: 3.9 cm left breast mass with left axillary lymph nodes.  10 mm lesion upper pole of left kidney suspicious for small renal cell cancer.  3 mm left lower lobe nodule, 4 mm subcapsular lesion anterior hepatic dome too small Bone scan 08/15/2019: Benign Breast MRI 08/12/2019: Locally advanced left breast cancer 4 cm, multiple irregular enhancing masses, diffuse skin thickening and enhancement and multiple pathogenic level 1 axillary lymph nodes at least 5-6 -------------------------------------------------------------------------------------------------------------------------------------------------- Current treatment: Cycle 1 day 1 TCH Perjeta Echocardiogram: 08/15/2019: EF 55% Labs reviewed, antiemetics reviewed Chemo consent obtained, chemo education completed  Renal mass: We will refer to urology.  Return to clinic in 1 week for toxicity check

## 2019-08-26 NOTE — Addendum Note (Signed)
Addended by: Nicholas Lose on: 08/26/2019 03:50 PM   Modules accepted: Orders

## 2019-08-26 NOTE — Progress Notes (Signed)
CRITICAL VALUE ALERT  Critical Value:  ANC 0.1  Date & Time Notied:  08/26/2019 at 1530  Provider Notified: Nicholas Lose, MD  Orders Received/Actions taken: Provider made aware

## 2019-08-26 NOTE — Telephone Encounter (Signed)
Pt was LVM due to no-showing for her appt. Called and informed patient this was her last scheduled visit. If she is feeling okay about her shoulder ROM at this time she does not need to call and re-schedule. If she would like to continue working on her shoulder ROM she needs to call and re-schedule more appointments.   Tomma Rakers DPT, CLT

## 2019-08-26 NOTE — Progress Notes (Signed)
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  Patient approved for the grant. She has a copy of the award letter and expense sheet along with the Outpatient pharmacy information. Went over expense sheet in detail on how they are submitted and paid. She received a gas card today from her grant.  She has my card for any additional financial questions or concerns.

## 2019-09-01 ENCOUNTER — Encounter: Payer: Self-pay | Admitting: Licensed Clinical Social Worker

## 2019-09-01 NOTE — Telephone Encounter (Signed)
Attempted to contact [name] Foster x4 with genetic test results, unable to reach her.

## 2019-09-04 ENCOUNTER — Ambulatory Visit: Payer: Medicare Other

## 2019-09-07 NOTE — Progress Notes (Signed)
Patient Care Team: Panosh, Standley Brooking, MD as PCP - General Stanford Breed, Denice Bors, MD (Cardiology) Susa Day, MD (Orthopedic Surgery) Mauro Kaufmann, RN as Oncology Nurse Navigator Rockwell Germany, RN as Oncology Nurse Navigator  DIAGNOSIS:    ICD-10-CM   1. Malignant neoplasm of overlapping sites of left breast in female, estrogen receptor negative ([place])  C50.812    Z17.1     SUMMARY OF ONCOLOGIC HISTORY: Oncology History  Malignant neoplasm of overlapping sites of left breast in female, estrogen receptor negative ([place])  08/01/2019 Initial Diagnosis   Patient palpated left breast lump and skin thickening. Mammogram showed a 3.6cm mass at 3:30 position, a 0.9cm mass at 3:00 position, a 0.7cm mass at 2:00 position, a 0.5cm mass at 2:00 position, a 0.3cm mass at 1:00 position, and a 0.4cm mass at 1:00 position, with 4 abnormal left axillary lymph nodes. Biopsy showed IDC, grade 3, HER-2 + (3+), ER/PR -, Ki67 70%, in the breast and lymph nodes.    08/06/2019 Cancer Staging   Staging form: Breast, AJCC 8th Edition - Clinical: Stage IIIB (cT4, cN1, cM0, G3, ER-, PR-, HER2+) - Signed by Nicholas Lose, MD on 08/06/2019   08/19/2019 -  Chemotherapy   The patient had palonosetron (ALOXI) injection 0.25 mg, 0.25 mg, Intravenous,  Once, 1 of 6 cycles Administration: 0.25 mg (08/19/2019) pegfilgrastim-cbqv (UDENYCA) injection 6 mg, 6 mg, Subcutaneous, Once, 1 of 6 cycles Administration: 6 mg (08/21/2019) CARBOplatin (PARAPLATIN) 480 mg in sodium chloride 0.9 % 250 mL chemo infusion, 480 mg (110.9 % of original dose 429.6 mg), Intravenous,  Once, 1 of 6 cycles Dose modification:   (original dose 429.6 mg, Cycle 1) Administration: 480 mg (08/19/2019) DOCEtaxel (TAXOTERE) 160 mg in sodium chloride 0.9 % 250 mL chemo infusion, 75 mg/m2 = 160 mg, Intravenous,  Once, 1 of 6 cycles Dose modification: 60 mg/m2 (original dose 75 mg/m2, Cycle 2, Reason: Dose not tolerated) Administration: 160 mg  (08/19/2019) pertuzumab (PERJETA) 420 mg in sodium chloride 0.9 % 250 mL chemo infusion, 420 mg (100 % of original dose 420 mg), Intravenous, Once, 1 of 6 cycles Dose modification: 420 mg (original dose 420 mg, Cycle 1, Reason: Provider Judgment) Administration: 420 mg (08/19/2019) fosaprepitant (EMEND) 150 mg, dexamethasone (DECADRON) 12 mg in sodium chloride 0.9 % 145 mL IVPB, , Intravenous,  Once, 1 of 6 cycles Administration:  (08/19/2019) trastuzumab-anns (KANJINTI) 750 mg in sodium chloride 0.9 % 250 mL chemo infusion, 756 mg (100 % of original dose 8 mg/kg), Intravenous,  Once, 1 of 6 cycles Dose modification: 8 mg/kg (original dose 8 mg/kg, Cycle 1, Reason: Other (see comments), Comment: change to approved brand), 6 mg/kg (original dose 6 mg/kg, Cycle 2, Reason: Other (see comments), Comment: brand change per insurance) Administration: 750 mg (08/19/2019)  for chemotherapy treatment.      CHIEF COMPLIANT: Cycle 2 TCH Perjeta  INTERVAL HISTORY: [name] Foster is a 68 y.o. with above-mentioned history of left breast cancer.She is currently on neoadjuvant chemotherapywith TCH Perjeta. She presents to the clinic today for cycle 2.   She has felt markedly fatigued and tired throughout the last 2 to 3 weeks.  Her mouth feels very sore but there is no evidence of thrush.  She has had episodes of diarrhea maximum was 4/day.  She also had episodes of nausea for which she has taken Zofran and Ativan.  She has been able to drink water.  Anything else makes the mouth very sore.  REVIEW OF SYSTEMS:  Constitutional: Denies fevers, chills or abnormal weight loss Eyes: Denies blurriness of vision Ears, nose, mouth, throat, and face: Mouth feels raw and sore Respiratory: Denies cough, dyspnea or wheezes Cardiovascular: Denies palpitation, chest discomfort Gastrointestinal: Nausea and intermittent diarrhea Skin: Denies abnormal skin rashes Lymphatics: Denies new lymphadenopathy or easy bruising  Neurological: Denies numbness, tingling or new weaknesses Behavioral/Psych: Mood is stable, no new changes  Extremities: No lower extremity edema Breast: denies any pain or lumps or nodules in either breasts All other systems were reviewed with the patient and are negative.  I have reviewed the past medical history, past surgical history, social history and family history with the patient and they are unchanged from previous note.  ALLERGIES:  is allergic to penicillins; tetanus toxoid; and aspirin.  MEDICATIONS:  Current Outpatient Medications  Medication Sig Dispense Refill  . Albuterol Sulfate (PROAIR RESPICLICK) 295 (90 Base) MCG/ACT AEPB Inhale 2 puffs into the lungs every 6 (six) hours as needed. (Patient taking differently: Inhale 2 puffs into the lungs every 6 (six) hours as needed (SOB / wheezing). ) 2 each 1  . amLODipine (NORVASC) 10 MG tablet TAKE 1 TABLET BY MOUTH  DAILY 90 tablet 3  . atorvastatin (LIPITOR) 20 MG tablet TAKE 1 TABLET BY MOUTH  DAILY 90 tablet 1  . Carboxymethylcellul-Glycerin (CLEAR EYES FOR DRY EYES) 1-0.25 % SOLN Place 1 drop into both eyes 2 (two) times daily as needed (dry eyes).    . Cholecalciferol (VITAMIN D) 50 MCG (2000 UT) tablet Take 2,000 Units by mouth daily.    Marland Kitchen dexamethasone (DECADRON) 4 MG tablet Take 1 tablet (4 mg total) by mouth 2 (two) times daily. Take 1 tablet day before chemo and 1 tablet day after chemo with food 12 tablet 0  . fexofenadine (ALLEGRA) 180 MG tablet Take 180 mg by mouth daily as needed for allergies.     . fluticasone (FLONASE) 50 MCG/ACT nasal spray USE 2 SPRAYS IN EACH NOSTRIL DAILY. PT NEEDS TO SCHEDULE A FOLLOW UP APPT BEFORE NEXT REFILL. (Patient taking differently: Place 2 sprays into both nostrils daily. ) 16 g 0  . furosemide (LASIX) 40 MG tablet TAKE 1 TABLET BY MOUTH  DAILY 90 tablet 1  . hydrALAZINE (APRESOLINE) 50 MG tablet TAKE 1 TABLET BY MOUTH 3  TIMES DAILY 270 tablet 3  . lidocaine-prilocaine (EMLA) cream  Apply to affected area once 30 g 3  . LORazepam (ATIVAN) 0.5 MG tablet Take 1 tablet (0.5 mg total) by mouth at bedtime as needed for sleep. 30 tablet 0  . magic mouthwash w/lidocaine SOLN Take 5 mLs by mouth 3 (three) times daily as needed for mouth pain. 100 mL 1  . metoprolol tartrate (LOPRESSOR) 50 MG tablet TAKE 1 TABLET BY MOUTH  TWICE DAILY 180 tablet 1  . ondansetron (ZOFRAN) 8 MG tablet Take 1 tablet (8 mg total) by mouth 2 (two) times daily as needed (Nausea or vomiting). Begin 4 days after chemotherapy. 30 tablet 1  . oxyCODONE (OXY IR/ROXICODONE) 5 MG immediate release tablet Take 1 tablet (5 mg total) by mouth every 6 (six) hours as needed for severe pain. 8 tablet 0  . potassium chloride SA (KLOR-CON) 20 MEQ tablet TAKE 1 TABLET BY MOUTH  DAILY 90 tablet 3  . prochlorperazine (COMPAZINE) 10 MG tablet Take 1 tablet (10 mg total) by mouth every 6 (six) hours as needed (Nausea or vomiting). 30 tablet 1  . ramipril (ALTACE) 10 MG capsule TAKE 1 CAPSULE BY MOUTH  DAILY 90 capsule 1  . sodium chloride (OCEAN) 0.65 % SOLN nasal spray Place 1 spray into both nostrils as needed for congestion.    Marland Kitchen warfarin (COUMADIN) 5 MG tablet Take 1 tablet daily or as directed by anticoagulation clinic. (Patient taking differently: Take 2.5-5 mg by mouth See admin instructions. Take 1 tablet daily or as directed by anticoagulation clinic; 5 mg all days except taking 2.5 mg on Wed) 100 tablet 1   No current facility-administered medications for this visit.     PHYSICAL EXAMINATION: ECOG PERFORMANCE STATUS: 1 - Symptomatic but completely ambulatory  Vitals:   09/08/19 0822  BP: (!) 145/79  Pulse: 86  Resp: 17  Temp: 98 F (36.7 C)  SpO2: 100%   Filed Weights   09/08/19 0822  Weight: 192 lb 4.8 oz (87.2 kg)    GENERAL: alert, no distress and comfortable SKIN: skin color, texture, turgor are normal, no rashes or significant lesions EYES: normal, Conjunctiva are pink and non-injected, sclera  clear OROPHARYNX: no exudate, no erythema and lips, buccal mucosa, and tongue normal  NECK: supple, thyroid normal size, non-tender, without nodularity LYMPH: no palpable lymphadenopathy in the cervical, axillary or inguinal LUNGS: clear to auscultation and percussion with normal breathing effort HEART: regular rate & rhythm and no murmurs and no lower extremity edema ABDOMEN: abdomen soft, non-tender and normal bowel sounds MUSCULOSKELETAL: no cyanosis of digits and no clubbing  NEURO: alert & oriented x 3 with fluent speech, no focal motor/sensory deficits EXTREMITIES: No lower extremity edema  LABORATORY DATA:  I have reviewed the data as listed CMP Latest Ref Rng & Units 09/08/2019 08/26/2019 08/15/2019  Glucose 70 - 99 mg/dL 125(H) 92 97  BUN 8 - 23 mg/dL _0 Creatinine 0.44 - 1.00 mg/dL 1.88(H) 1.57(H) 1.50(H)  Sodium 135 - 145 mmol/L 148(H) 145 145  Potassium 3.5 - 5.1 mmol/L 4.0 4.2 3.9  Chloride 98 - 111 mmol/L 112(H) 109 108  CO2 22 - 32 mmol/L 19(L) 26 27  Calcium 8.9 - 10.3 mg/dL 9.2 8.5(L) 9.1  Total Protein 6.5 - 8.1 g/dL 7.0 6.6 7.2  Total Bilirubin 0.3 - 1.2 mg/dL 0.4 0.4 0.6  Alkaline Phos 38 - 126 U/L 136(H) 106 110  AST 15 - 41 U/L _1 ALT 0 - 44 U/L _2 Lab Results  Component Value Date   WBC 9.4 09/08/2019   HGB 12.4 09/08/2019   HCT 39.3 09/08/2019   MCV 91.0 09/08/2019   PLT 169 09/08/2019   NEUTROABS 7.2 09/08/2019    ASSESSMENT & PLAN:  Malignant neoplasm of overlapping sites of left breast in female, estrogen receptor negative ([place]) 08/01/2019:Patient palpated left breast lump and skin thickening. Mammogram showed a 3.6cm mass at 3:30 position, a 0.9cm mass at 3:00 position, a 0.7cm mass at 2:00 position, a 0.5cm mass at 2:00 position, a 0.3cm mass at 1:00 position, and a 0.4cm mass at 1:00 position, with 4 abnormal left axillary lymph nodes. Biopsy showed IDC, grade 3, HER-2 + (3+), ER/PR -, Ki67 70%, in the breast and lymph  nodes.  Treatment plan 1. Neoadjuvant chemotherapy with TCH Perjeta 6 cycles followed by HerceptinPerjetamaintenance versus Kadcyla maintenance (depending on response to chemo)for 1 year 2. Followed bymastectomy withaxillary lymph node dissection 3. Followed by adjuvant radiation therapy   CT CAP 08/12/2019: 3.9 cm left breast mass with left axillary lymph nodes.  10 mm lesion upper pole of left kidney suspicious for small renal  cell cancer.  3 mm left lower lobe nodule, 4 mm subcapsular lesion anterior hepatic dome too small Bone scan 08/15/2019: Benign Breast MRI 08/12/2019: Locally advanced left breast cancer 4 cm, multiple irregular enhancing masses, diffuse skin thickening and enhancement and multiple pathogenic level 1 axillary lymph nodes at least 5-6 -------------------------------------------------------------------------------------------------------------------------------------------------- Current treatment: Cycle 2 TCH Perjeta Echocardiogram: 08/15/2019: EF 55% Chemo toxicities: 1.  Mild diarrhea: Takes Imodium as needed 2.  Loss of taste and decreased appetite  3.  Fatigue: The fatigue appears to be very severe.  Reduced the dosage of cycle 2. 4.  Mouth sores: Magic mouthwash has been prescribed. 5.  Renal insufficiency: Slightly worse.  I will add IV fluids to her treatment today.  I instructed her to call us if she is not feeling up to it so that we can hydrate her. Renal mass: Abdominal MRI ordered, we will refer to urology. INR: 1.2: We will send this information to her primary care physician to adjust her Coumadin dose.  Return to clinic in 3 weeks for cycle 3 TCHP    No orders of the defined types were placed in this encounter.  The patient has a good understanding of the overall plan. she agrees with it. she will call with any problems that may develop before the next visit here.  Nicholas Lose, MD 09/08/2019  Julious Oka Dorshimer, am acting as scribe  for Dr. Nicholas Lose.  I have reviewed the above documentation for accuracy and completeness, and I agree with the above.

## 2019-09-08 ENCOUNTER — Telehealth: Payer: Self-pay | Admitting: Internal Medicine

## 2019-09-08 ENCOUNTER — Inpatient Hospital Stay (HOSPITAL_BASED_OUTPATIENT_CLINIC_OR_DEPARTMENT_OTHER): Payer: Medicare Other | Admitting: Hematology and Oncology

## 2019-09-08 ENCOUNTER — Encounter: Payer: Self-pay | Admitting: *Deleted

## 2019-09-08 ENCOUNTER — Other Ambulatory Visit: Payer: Self-pay

## 2019-09-08 ENCOUNTER — Telehealth: Payer: Self-pay | Admitting: *Deleted

## 2019-09-08 ENCOUNTER — Encounter: Payer: Self-pay | Admitting: Licensed Clinical Social Worker

## 2019-09-08 ENCOUNTER — Inpatient Hospital Stay: Payer: Medicare Other

## 2019-09-08 ENCOUNTER — Ambulatory Visit (INDEPENDENT_AMBULATORY_CARE_PROVIDER_SITE_OTHER): Payer: Medicare Other | Admitting: General Practice

## 2019-09-08 ENCOUNTER — Inpatient Hospital Stay: Payer: Medicare Other | Attending: Hematology and Oncology

## 2019-09-08 DIAGNOSIS — Z5112 Encounter for antineoplastic immunotherapy: Secondary | ICD-10-CM | POA: Insufficient documentation

## 2019-09-08 DIAGNOSIS — Z5111 Encounter for antineoplastic chemotherapy: Secondary | ICD-10-CM | POA: Insufficient documentation

## 2019-09-08 DIAGNOSIS — Z171 Estrogen receptor negative status [ER-]: Secondary | ICD-10-CM

## 2019-09-08 DIAGNOSIS — Z5189 Encounter for other specified aftercare: Secondary | ICD-10-CM | POA: Diagnosis not present

## 2019-09-08 DIAGNOSIS — Z95828 Presence of other vascular implants and grafts: Secondary | ICD-10-CM | POA: Insufficient documentation

## 2019-09-08 DIAGNOSIS — C50812 Malignant neoplasm of overlapping sites of left female breast: Secondary | ICD-10-CM | POA: Insufficient documentation

## 2019-09-08 DIAGNOSIS — C773 Secondary and unspecified malignant neoplasm of axilla and upper limb lymph nodes: Secondary | ICD-10-CM | POA: Insufficient documentation

## 2019-09-08 DIAGNOSIS — Z7901 Long term (current) use of anticoagulants: Secondary | ICD-10-CM

## 2019-09-08 LAB — CBC WITH DIFFERENTIAL (CANCER CENTER ONLY)
Abs Immature Granulocytes: 0.15 10*3/uL — ABNORMAL HIGH (ref 0.00–0.07)
Basophils Absolute: 0 10*3/uL (ref 0.0–0.1)
Basophils Relative: 0 %
Eosinophils Absolute: 0 10*3/uL (ref 0.0–0.5)
Eosinophils Relative: 0 %
HCT: 39.3 % (ref 36.0–46.0)
Hemoglobin: 12.4 g/dL (ref 12.0–15.0)
Immature Granulocytes: 2 %
Lymphocytes Relative: 17 %
Lymphs Abs: 1.6 10*3/uL (ref 0.7–4.0)
MCH: 28.7 pg (ref 26.0–34.0)
MCHC: 31.6 g/dL (ref 30.0–36.0)
MCV: 91 fL (ref 80.0–100.0)
Monocytes Absolute: 0.4 10*3/uL (ref 0.1–1.0)
Monocytes Relative: 5 %
Neutro Abs: 7.2 10*3/uL (ref 1.7–7.7)
Neutrophils Relative %: 76 %
Platelet Count: 169 10*3/uL (ref 150–400)
RBC: 4.32 MIL/uL (ref 3.87–5.11)
RDW: 13.6 % (ref 11.5–15.5)
WBC Count: 9.4 10*3/uL (ref 4.0–10.5)
nRBC: 0.2 % (ref 0.0–0.2)

## 2019-09-08 LAB — CMP (CANCER CENTER ONLY)
ALT: 17 U/L (ref 0–44)
AST: 19 U/L (ref 15–41)
Albumin: 3.8 g/dL (ref 3.5–5.0)
Alkaline Phosphatase: 136 U/L — ABNORMAL HIGH (ref 38–126)
Anion gap: 17 — ABNORMAL HIGH (ref 5–15)
BUN: 22 mg/dL (ref 8–23)
CO2: 19 mmol/L — ABNORMAL LOW (ref 22–32)
Calcium: 9.2 mg/dL (ref 8.9–10.3)
Chloride: 112 mmol/L — ABNORMAL HIGH (ref 98–111)
Creatinine: 1.88 mg/dL — ABNORMAL HIGH (ref 0.44–1.00)
GFR, Est AFR Am: 31 mL/min — ABNORMAL LOW (ref >60)
GFR, Estimated: 27 mL/min — ABNORMAL LOW (ref >60)
Glucose, Bld: 125 mg/dL — ABNORMAL HIGH (ref 70–99)
Potassium: 4 mmol/L (ref 3.5–5.1)
Sodium: 148 mmol/L — ABNORMAL HIGH (ref 135–145)
Total Bilirubin: 0.4 mg/dL (ref 0.3–1.2)
Total Protein: 7 g/dL (ref 6.5–8.1)

## 2019-09-08 LAB — PROTIME-INR
INR: 1.2 (ref 0.8–1.2)
Prothrombin Time: 14.8 seconds (ref 11.4–15.2)

## 2019-09-08 MED ORDER — SODIUM CHLORIDE 0.9 % IV SOLN
INTRAVENOUS | Status: DC
  Administered 2019-09-08: 11:00:00 via INTRAVENOUS
  Filled 2019-09-08: qty 250

## 2019-09-08 MED ORDER — TRASTUZUMAB-ANNS CHEMO 150 MG IV SOLR
6.0000 mg/kg | Freq: Once | INTRAVENOUS | Status: AC
  Administered 2019-09-08: 11:00:00 567 mg via INTRAVENOUS
  Filled 2019-09-08: qty 27

## 2019-09-08 MED ORDER — DIPHENHYDRAMINE HCL 25 MG PO CAPS
50.0000 mg | ORAL_CAPSULE | Freq: Once | ORAL | Status: AC
  Administered 2019-09-08: 50 mg via ORAL

## 2019-09-08 MED ORDER — SODIUM CHLORIDE 0.9 % IV SOLN
60.0000 mg/m2 | Freq: Once | INTRAVENOUS | Status: AC
  Administered 2019-09-08: 130 mg via INTRAVENOUS
  Filled 2019-09-08: qty 13

## 2019-09-08 MED ORDER — SODIUM CHLORIDE 0.9 % IV SOLN
Freq: Once | INTRAVENOUS | Status: AC
  Administered 2019-09-08: 09:00:00 via INTRAVENOUS
  Filled 2019-09-08: qty 250

## 2019-09-08 MED ORDER — MAGIC MOUTHWASH W/LIDOCAINE: 5.0000 mL | Freq: Three times a day (TID) | ORAL | 1 refills | Status: DC | PRN

## 2019-09-08 MED ORDER — SODIUM CHLORIDE 0.9 % IV SOLN
Freq: Once | INTRAVENOUS | Status: AC
  Administered 2019-09-08: 10:00:00 via INTRAVENOUS
  Filled 2019-09-08: qty 5

## 2019-09-08 MED ORDER — PALONOSETRON HCL INJECTION 0.25 MG/5ML
INTRAVENOUS | Status: AC
  Filled 2019-09-08: qty 5

## 2019-09-08 MED ORDER — SODIUM CHLORIDE 0.9 % IV SOLN
420.0000 mg | Freq: Once | INTRAVENOUS | Status: AC
  Administered 2019-09-08: 420 mg via INTRAVENOUS
  Filled 2019-09-08: qty 14

## 2019-09-08 MED ORDER — SODIUM CHLORIDE 0.9% FLUSH
10.0000 mL | INTRAVENOUS | Status: DC | PRN
  Administered 2019-09-08: 10 mL
  Filled 2019-09-08: qty 10

## 2019-09-08 MED ORDER — HEPARIN SOD (PORK) LOCK FLUSH 100 UNIT/ML IV SOLN
500.0000 [IU] | Freq: Once | INTRAVENOUS | Status: AC | PRN
  Administered 2019-09-08: 500 [IU]
  Filled 2019-09-08: qty 5

## 2019-09-08 MED ORDER — DIPHENHYDRAMINE HCL 25 MG PO CAPS
ORAL_CAPSULE | ORAL | Status: AC
  Filled 2019-09-08: qty 2

## 2019-09-08 MED ORDER — ALTEPLASE 2 MG IJ SOLR
2.0000 mg | Freq: Once | INTRAMUSCULAR | Status: DC | PRN
  Filled 2019-09-08: qty 2

## 2019-09-08 MED ORDER — PALONOSETRON HCL INJECTION 0.25 MG/5ML
0.2500 mg | Freq: Once | INTRAVENOUS | Status: AC
  Administered 2019-09-08: 0.25 mg via INTRAVENOUS

## 2019-09-08 MED ORDER — SODIUM CHLORIDE 0.9 % IV SOLN
342.0000 mg | Freq: Once | INTRAVENOUS | Status: AC
  Administered 2019-09-08: 340 mg via INTRAVENOUS
  Filled 2019-09-08: qty 34

## 2019-09-08 MED ORDER — ACETAMINOPHEN 325 MG PO TABS
650.0000 mg | ORAL_TABLET | Freq: Once | ORAL | Status: AC
  Administered 2019-09-08: 650 mg via ORAL

## 2019-09-08 MED ORDER — HEPARIN SOD (PORK) LOCK FLUSH 100 UNIT/ML IV SOLN
500.0000 [IU] | Freq: Once | INTRAVENOUS | Status: DC | PRN
  Filled 2019-09-08: qty 5

## 2019-09-08 MED ORDER — ACETAMINOPHEN 325 MG PO TABS
ORAL_TABLET | ORAL | Status: AC
  Filled 2019-09-08: qty 2

## 2019-09-08 MED FILL — MAGIC MW (MAAL,LIDO,BEN): 7 days supply | Qty: 100 | Fill #0

## 2019-09-08 NOTE — Telephone Encounter (Signed)
RN placed call to [facility] regarding scheduling pt for abdominal MRI.  [facility] states pt has been contacted x2 with no return call.   States they will attempt to contact pt again to schedule MRI.  RN verified pt contact information.  RN also place call to pt PCP Dr. Shanon Ace to let her be aware that pt INR is 1.2, verbalized understanding.

## 2019-09-08 NOTE — Patient Instructions (Signed)
Pre visit review using our clinic review tool, if applicable. No additional management support is needed unless otherwise documented below in the visit note.  LMOVM for pt to take 1 1/2 tablets (7.5 mg) today, tomorrow and Wednesday.  On Thursday resume taking 5 mg daily except 2.5 mg on Wednesdays.  Re-check in 1 week. I asked pt to call me to verify that she received this message.

## 2019-09-08 NOTE — Telephone Encounter (Signed)
[name] Foster is calling from Dr. Darletta Moll from the [facility] is calling to report that the patient's INR-1.2. Calling to let Dr. Regis Bill know since Dr. Regis Bill manages the patient's Coumadin.  [phone]

## 2019-09-08 NOTE — Telephone Encounter (Signed)
Dr. Regis Bill,  I have left patient a message with dosing instructions for her coumadin.  I asked her to call me back to verify she received.  Villa Herb, RN

## 2019-09-08 NOTE — Assessment & Plan Note (Signed)
08/01/2019:Patient palpated left breast lump and skin thickening. Mammogram showed a 3.6cm mass at 3:30 position, a 0.9cm mass at 3:00 position, a 0.7cm mass at 2:00 position, a 0.5cm mass at 2:00 position, a 0.3cm mass at 1:00 position, and a 0.4cm mass at 1:00 position, with 4 abnormal left axillary lymph nodes. Biopsy showed IDC, grade 3, HER-2 + (3+), ER/PR -, Ki67 70%, in the breast and lymph nodes.  Treatment plan 1. Neoadjuvant chemotherapy with TCH Perjeta 6 cycles followed by HerceptinPerjetamaintenance versus Kadcyla maintenance (depending on response to chemo)for 1 year 2. Followed bymastectomy withaxillary lymph node dissection 3. Followed by adjuvant radiation therapy   CT CAP 08/12/2019: 3.9 cm left breast mass with left axillary lymph nodes.  10 mm lesion upper pole of left kidney suspicious for small renal cell cancer.  3 mm left lower lobe nodule, 4 mm subcapsular lesion anterior hepatic dome too small Bone scan 08/15/2019: Benign Breast MRI 08/12/2019: Locally advanced left breast cancer 4 cm, multiple irregular enhancing masses, diffuse skin thickening and enhancement and multiple pathogenic level 1 axillary lymph nodes at least 5-6 -------------------------------------------------------------------------------------------------------------------------------------------------- Current treatment: Cycle 2 TCH Perjeta Echocardiogram: 08/15/2019: EF 55% Chemo toxicities: 1.  Mild diarrhea: Takes Imodium as needed 2.  Loss of taste and decreased appetite: Patient is worried about not enjoying Thanksgiving food 3.  Fatigue   Renal mass: Abdominal MRI ordered, we will refer to urology.  Return to clinic in 3 weeks for cycle 3 TCHP

## 2019-09-08 NOTE — Patient Instructions (Signed)
[facility] Discharge Instructions for Patients Receiving Chemotherapy  Today you received the following chemotherapy agents: Trastuzumab-anns (Kanjinti), Pertuzumab (Perjeta), Docetaxel (Taxotere), and Carboplatin (Paraplatin)  To help prevent nausea and vomiting after your treatment, we encourage you to take your nausea medication as directed. Received Aloxi during your treatment today. For the next 3 days take your Compazine prescription (not your Zofran prescription) as needed for nausea. You can add Zofran into your regimen starting Friday as needed.   If you develop nausea and vomiting that is not controlled by your nausea medication, call the clinic.   BELOW ARE SYMPTOMS THAT SHOULD BE REPORTED IMMEDIATELY:  *FEVER GREATER THAN 100.5 F  *CHILLS WITH OR WITHOUT FEVER  NAUSEA AND VOMITING THAT IS NOT CONTROLLED WITH YOUR NAUSEA MEDICATION  *UNUSUAL SHORTNESS OF BREATH  *UNUSUAL BRUISING OR BLEEDING  TENDERNESS IN MOUTH AND THROAT WITH OR WITHOUT PRESENCE OF ULCERS  *URINARY PROBLEMS  *BOWEL PROBLEMS  UNUSUAL RASH Items with * indicate a potential emergency and should be followed up as soon as possible.  Feel free to call the clinic should you have any questions or concerns. The clinic phone number is [phone].  Please show the [place] at check-in to the Emergency Department and triage nurse.  Trastuzumab injection for infusion What is this medicine? TRASTUZUMAB (tras TOO zoo mab) is a monoclonal antibody. It is used to treat breast cancer and stomach cancer. This medicine may be used for other purposes; ask your health care provider or pharmacist if you have questions. COMMON BRAND NAME(S): Herceptin, Galvin Proffer, Trazimera What should I tell my health care provider before I take this medicine? They need to know if you have any of these conditions:  heart disease  heart failure  lung or breathing disease, like  asthma  an unusual or allergic reaction to trastuzumab, benzyl alcohol, or other medications, foods, dyes, or preservatives  pregnant or trying to get pregnant  breast-feeding How should I use this medicine? This drug is given as an infusion into a vein. It is administered in a hospital or clinic by a specially trained health care professional. Talk to your pediatrician regarding the use of this medicine in children. This medicine is not approved for use in children. Overdosage: If you think you have taken too much of this medicine contact a poison control center or emergency room at once. NOTE: This medicine is only for you. Do not share this medicine with others. What if I miss a dose? It is important not to miss a dose. Call your doctor or health care professional if you are unable to keep an appointment. What may interact with this medicine? This medicine may interact with the following medications:  certain types of chemotherapy, such as daunorubicin, doxorubicin, epirubicin, and idarubicin This list may not describe all possible interactions. Give your health care provider a list of all the medicines, herbs, non-prescription drugs, or dietary supplements you use. Also tell them if you smoke, drink alcohol, or use illegal drugs. Some items may interact with your medicine. What should I watch for while using this medicine? Visit your doctor for checks on your progress. Report any side effects. Continue your course of treatment even though you feel ill unless your doctor tells you to stop. Call your doctor or health care professional for advice if you get a fever, chills or sore throat, or other symptoms of a cold or flu. Do not treat yourself. Try to avoid being around  people who are sick. You may experience fever, chills and shaking during your first infusion. These effects are usually mild and can be treated with other medicines. Report any side effects during the infusion to your health  care professional. Fever and chills usually do not happen with later infusions. Do not become pregnant while taking this medicine or for 7 months after stopping it. Women should inform their doctor if they wish to become pregnant or think they might be pregnant. Women of child-bearing potential will need to have a negative pregnancy test before starting this medicine. There is a potential for serious side effects to an unborn child. Talk to your health care professional or pharmacist for more information. Do not breast-feed an infant while taking this medicine or for 7 months after stopping it. Women must use effective birth control with this medicine. What side effects may I notice from receiving this medicine? Side effects that you should report to your doctor or health care professional as soon as possible:  allergic reactions like skin rash, itching or hives, swelling of the face, lips, or tongue  chest pain or palpitations  cough  dizziness  feeling faint or lightheaded, falls  fever  general ill feeling or flu-like symptoms  signs of worsening heart failure like breathing problems; swelling in your legs and feet  unusually weak or tired Side effects that usually do not require medical attention (report to your doctor or health care professional if they continue or are bothersome):  bone pain  changes in taste  diarrhea  joint pain  nausea/vomiting  weight loss This list may not describe all possible side effects. Call your doctor for medical advice about side effects. You may report side effects to FDA at 1-800-FDA-1088. Where should I keep my medicine? This drug is given in a hospital or clinic and will not be stored at home. NOTE: This sheet is a summary. It may not cover all possible information. If you have questions about this medicine, talk to your doctor, pharmacist, or health care provider.  2020 Elsevier/Gold Standard (2016-09-12 14:37:52)  Pertuzumab  injection What is this medicine? PERTUZUMAB (per TOOZ ue mab) is a monoclonal antibody. It is used to treat breast cancer. This medicine may be used for other purposes; ask your health care provider or pharmacist if you have questions. COMMON BRAND NAME(S): PERJETA What should I tell my health care provider before I take this medicine? They need to know if you have any of these conditions:  heart disease  heart failure  high blood pressure  history of irregular heart beat  recent or ongoing radiation therapy  an unusual or allergic reaction to pertuzumab, other medicines, foods, dyes, or preservatives  pregnant or trying to get pregnant  breast-feeding How should I use this medicine? This medicine is for infusion into a vein. It is given by a health care professional in a hospital or clinic setting. Talk to your pediatrician regarding the use of this medicine in children. Special care may be needed. Overdosage: If you think you have taken too much of this medicine contact a poison control center or emergency room at once. NOTE: This medicine is only for you. Do not share this medicine with others. What if I miss a dose? It is important not to miss your dose. Call your doctor or health care professional if you are unable to keep an appointment. What may interact with this medicine? Interactions are not expected. Give your health care provider a list  of all the medicines, herbs, non-prescription drugs, or dietary supplements you use. Also tell them if you smoke, drink alcohol, or use illegal drugs. Some items may interact with your medicine. This list may not describe all possible interactions. Give your health care provider a list of all the medicines, herbs, non-prescription drugs, or dietary supplements you use. Also tell them if you smoke, drink alcohol, or use illegal drugs. Some items may interact with your medicine. What should I watch for while using this medicine? Your  condition will be monitored carefully while you are receiving this medicine. Report any side effects. Continue your course of treatment even though you feel ill unless your doctor tells you to stop. Do not become pregnant while taking this medicine or for 7 months after stopping it. Women should inform their doctor if they wish to become pregnant or think they might be pregnant. Women of child-bearing potential will need to have a negative pregnancy test before starting this medicine. There is a potential for serious side effects to an unborn child. Talk to your health care professional or pharmacist for more information. Do not breast-feed an infant while taking this medicine or for 7 months after stopping it. Women must use effective birth control with this medicine. Call your doctor or health care professional for advice if you get a fever, chills or sore throat, or other symptoms of a cold or flu. Do not treat yourself. Try to avoid being around people who are sick. You may experience fever, chills, and headache during the infusion. Report any side effects during the infusion to your health care professional. What side effects may I notice from receiving this medicine? Side effects that you should report to your doctor or health care professional as soon as possible:  breathing problems  chest pain or palpitations  dizziness  feeling faint or lightheaded  fever or chills  skin rash, itching or hives  sore throat  swelling of the face, lips, or tongue  swelling of the legs or ankles  unusually weak or tired Side effects that usually do not require medical attention (report to your doctor or health care professional if they continue or are bothersome):  diarrhea  hair loss  nausea, vomiting  tiredness This list may not describe all possible side effects. Call your doctor for medical advice about side effects. You may report side effects to FDA at 1-800-FDA-1088. Where should I  keep my medicine? This drug is given in a hospital or clinic and will not be stored at home. NOTE: This sheet is a summary. It may not cover all possible information. If you have questions about this medicine, talk to your doctor, pharmacist, or health care provider.  2020 Elsevier/Gold Standard (2015-10-21 12:08:50)  Docetaxel injection What is this medicine? DOCETAXEL (doe se TAX el) is a chemotherapy drug. It targets fast dividing cells, like cancer cells, and causes these cells to die. This medicine is used to treat many types of cancers like breast cancer, certain stomach cancers, head and neck cancer, lung cancer, and prostate cancer. This medicine may be used for other purposes; ask your health care provider or pharmacist if you have questions. COMMON BRAND NAME(S): Docefrez, Taxotere What should I tell my health care provider before I take this medicine? They need to know if you have any of these conditions:  infection (especially a virus infection such as chickenpox, cold sores, or herpes)  liver disease  low blood counts, like low white cell, platelet, or red  cell counts  an unusual or allergic reaction to docetaxel, polysorbate 80, other chemotherapy agents, other medicines, foods, dyes, or preservatives  pregnant or trying to get pregnant  breast-feeding How should I use this medicine? This drug is given as an infusion into a vein. It is administered in a hospital or clinic by a specially trained health care professional. Talk to your pediatrician regarding the use of this medicine in children. Special care may be needed. Overdosage: If you think you have taken too much of this medicine contact a poison control center or emergency room at once. NOTE: This medicine is only for you. Do not share this medicine with others. What if I miss a dose? It is important not to miss your dose. Call your doctor or health care professional if you are unable to keep an appointment. What  may interact with this medicine?  aprepitant  certain antibiotics like erythromycin or clarithromycin  certain antivirals for HIV or hepatitis  certain medicines for fungal infections like fluconazole, itraconazole, ketoconazole, posaconazole, or voriconazole  cimetidine  ciprofloxacin  conivaptan  cyclosporine  dronedarone  fluvoxamine  grapefruit juice  imatinib  verapamil This list may not describe all possible interactions. Give your health care provider a list of all the medicines, herbs, non-prescription drugs, or dietary supplements you use. Also tell them if you smoke, drink alcohol, or use illegal drugs. Some items may interact with your medicine. What should I watch for while using this medicine? Your condition will be monitored carefully while you are receiving this medicine. You will need important blood work done while you are taking this medicine. Call your doctor or health care professional for advice if you get a fever, chills or sore throat, or other symptoms of a cold or flu. Do not treat yourself. This drug decreases your body's ability to fight infections. Try to avoid being around people who are sick. Some products may contain alcohol. Ask your health care professional if this medicine contains alcohol. Be sure to tell all health care professionals you are taking this medicine. Certain medicines, like metronidazole and disulfiram, can cause an unpleasant reaction when taken with alcohol. The reaction includes flushing, headache, nausea, vomiting, sweating, and increased thirst. The reaction can last from 30 minutes to several hours. You may get drowsy or dizzy. Do not drive, use machinery, or do anything that needs mental alertness until you know how this medicine affects you. Do not stand or sit up quickly, especially if you are an older patient. This reduces the risk of dizzy or fainting spells. Alcohol may interfere with the effect of this medicine. Talk to  your health care professional about your risk of cancer. You may be more at risk for certain types of cancer if you take this medicine. Do not become pregnant while taking this medicine or for 6 months after stopping it. Women should inform their doctor if they wish to become pregnant or think they might be pregnant. There is a potential for serious side effects to an unborn child. Talk to your health care professional or pharmacist for more information. Do not breast-feed an infant while taking this medicine or for 1 to 2 weeks after stopping it. Males who get this medicine must use a condom during sex with females who can get pregnant. If you get a woman pregnant, the baby could have birth defects. The baby could die before they are born. You will need to continue wearing a condom for 3 months after stopping the  medicine. Tell your health care provider right away if your partner becomes pregnant while you are taking this medicine. This may interfere with the ability to father a child. You should talk to your doctor or health care professional if you are concerned about your fertility. What side effects may I notice from receiving this medicine? Side effects that you should report to your doctor or health care professional as soon as possible:  allergic reactions like skin rash, itching or hives, swelling of the face, lips, or tongue  blurred vision  breathing problems  changes in vision  low blood counts - This drug may decrease the number of white blood cells, red blood cells and platelets. You may be at increased risk for infections and bleeding.  nausea and vomiting  pain, redness or irritation at site where injected  pain, tingling, numbness in the hands or feet  redness, blistering, peeling, or loosening of the skin, including inside the mouth  signs of decreased platelets or bleeding - bruising, pinpoint red spots on the skin, black, tarry stools, nosebleeds  signs of decreased red  blood cells - unusually weak or tired, fainting spells, lightheadedness  signs of infection - fever or chills, cough, sore throat, pain or difficulty passing urine  swelling of the ankle, feet, hands Side effects that usually do not require medical attention (report to your doctor or health care professional if they continue or are bothersome):  constipation  diarrhea  fingernail or toenail changes  hair loss  loss of appetite  mouth sores  muscle pain This list may not describe all possible side effects. Call your doctor for medical advice about side effects. You may report side effects to FDA at 1-800-FDA-1088. Where should I keep my medicine? This drug is given in a hospital or clinic and will not be stored at home. NOTE: This sheet is a summary. It may not cover all possible information. If you have questions about this medicine, talk to your doctor, pharmacist, or health care provider.  2020 Elsevier/Gold Standard (2018-11-22 12:23:11)  Carboplatin injection What is this medicine? CARBOPLATIN (KAR boe pla tin) is a chemotherapy drug. It targets fast dividing cells, like cancer cells, and causes these cells to die. This medicine is used to treat ovarian cancer and many other cancers. This medicine may be used for other purposes; ask your health care provider or pharmacist if you have questions. COMMON BRAND NAME(S): Paraplatin What should I tell my health care provider before I take this medicine? They need to know if you have any of these conditions:  blood disorders  hearing problems  kidney disease  recent or ongoing radiation therapy  an unusual or allergic reaction to carboplatin, cisplatin, other chemotherapy, other medicines, foods, dyes, or preservatives  pregnant or trying to get pregnant  breast-feeding How should I use this medicine? This drug is usually given as an infusion into a vein. It is administered in a hospital or clinic by a specially trained  health care professional. Talk to your pediatrician regarding the use of this medicine in children. Special care may be needed. Overdosage: If you think you have taken too much of this medicine contact a poison control center or emergency room at once. NOTE: This medicine is only for you. Do not share this medicine with others. What if I miss a dose? It is important not to miss a dose. Call your doctor or health care professional if you are unable to keep an appointment. What may interact with  this medicine?  medicines for seizures  medicines to increase blood counts like filgrastim, pegfilgrastim, sargramostim  some antibiotics like amikacin, gentamicin, neomycin, streptomycin, tobramycin  vaccines Talk to your doctor or health care professional before taking any of these medicines:  acetaminophen  aspirin  ibuprofen  ketoprofen  naproxen This list may not describe all possible interactions. Give your health care provider a list of all the medicines, herbs, non-prescription drugs, or dietary supplements you use. Also tell them if you smoke, drink alcohol, or use illegal drugs. Some items may interact with your medicine. What should I watch for while using this medicine? Your condition will be monitored carefully while you are receiving this medicine. You will need important blood work done while you are taking this medicine. This drug may make you feel generally unwell. This is not uncommon, as chemotherapy can affect healthy cells as well as cancer cells. Report any side effects. Continue your course of treatment even though you feel ill unless your doctor tells you to stop. In some cases, you may be given additional medicines to help with side effects. Follow all directions for their use. Call your doctor or health care professional for advice if you get a fever, chills or sore throat, or other symptoms of a cold or flu. Do not treat yourself. This drug decreases your body's ability  to fight infections. Try to avoid being around people who are sick. This medicine may increase your risk to bruise or bleed. Call your doctor or health care professional if you notice any unusual bleeding. Be careful brushing and flossing your teeth or using a toothpick because you may get an infection or bleed more easily. If you have any dental work done, tell your dentist you are receiving this medicine. Avoid taking products that contain aspirin, acetaminophen, ibuprofen, naproxen, or ketoprofen unless instructed by your doctor. These medicines may hide a fever. Do not become pregnant while taking this medicine. Women should inform their doctor if they wish to become pregnant or think they might be pregnant. There is a potential for serious side effects to an unborn child. Talk to your health care professional or pharmacist for more information. Do not breast-feed an infant while taking this medicine. What side effects may I notice from receiving this medicine? Side effects that you should report to your doctor or health care professional as soon as possible:  allergic reactions like skin rash, itching or hives, swelling of the face, lips, or tongue  signs of infection - fever or chills, cough, sore throat, pain or difficulty passing urine  signs of decreased platelets or bleeding - bruising, pinpoint red spots on the skin, black, tarry stools, nosebleeds  signs of decreased red blood cells - unusually weak or tired, fainting spells, lightheadedness  breathing problems  changes in hearing  changes in vision  chest pain  high blood pressure  low blood counts - This drug may decrease the number of white blood cells, red blood cells and platelets. You may be at increased risk for infections and bleeding.  nausea and vomiting  pain, swelling, redness or irritation at the injection site  pain, tingling, numbness in the hands or feet  problems with balance, talking, walking  trouble  passing urine or change in the amount of urine Side effects that usually do not require medical attention (report to your doctor or health care professional if they continue or are bothersome):  hair loss  loss of appetite  metallic taste in the mouth  or changes in taste This list may not describe all possible side effects. Call your doctor for medical advice about side effects. You may report side effects to FDA at 1-800-FDA-1088. Where should I keep my medicine? This drug is given in a hospital or clinic and will not be stored at home. NOTE: This sheet is a summary. It may not cover all possible information. If you have questions about this medicine, talk to your doctor, pharmacist, or health care provider.  2020 Elsevier/Gold Standard (2007-12-24 14:38:05)  Coronavirus (COVID-19) Are you at risk?  Are you at risk for the Coronavirus (COVID-19)?  To be considered HIGH RISK for Coronavirus (COVID-19), you have to meet the following criteria:  . Traveled to [place], [place], [place], [place] or [place]; or in the [place] to [place], [place], [place], or [place]; and have fever, cough, and shortness of breath within the last 2 weeks of travel OR . Been in close contact with a person diagnosed with COVID-19 within the last 2 weeks and have fever, cough, and shortness of breath . IF YOU DO NOT MEET THESE CRITERIA, YOU ARE CONSIDERED LOW RISK FOR COVID-19.  What to do if you are HIGH RISK for COVID-19?  Marland Kitchen If you are having a medical emergency, call 911. . Seek medical care right away. Before you go to a doctor's office, urgent care or emergency department, call ahead and tell them about your recent travel, contact with someone diagnosed with COVID-19, and your symptoms. You should receive instructions from your physician's office regarding next steps of care.  . When you arrive at healthcare provider, tell the healthcare staff immediately you have returned from visiting [place],  [place], [place], [place] or [place]; or traveled in the [place] to [place], [place], [place], or [place]; in the last two weeks or you have been in close contact with a person diagnosed with COVID-19 in the last 2 weeks.   . Tell the health care staff about your symptoms: fever, cough and shortness of breath. . After you have been seen by a medical provider, you will be either: o Tested for (COVID-19) and discharged home on quarantine except to seek medical care if symptoms worsen, and asked to  - Stay home and avoid contact with others until you get your results (4-5 days)  - Avoid travel on public transportation if possible (such as bus, train, or airplane) or o Sent to the Emergency Department by EMS for evaluation, COVID-19 testing, and possible admission depending on your condition and test results.  What to do if you are LOW RISK for COVID-19?  Reduce your risk of any infection by using the same precautions used for avoiding the common cold or flu:  Marland Kitchen Wash your hands often with soap and warm water for at least 20 seconds.  If soap and water are not readily available, use an alcohol-based hand sanitizer with at least 60% alcohol.  . If coughing or sneezing, cover your mouth and nose by coughing or sneezing into the elbow areas of your shirt or coat, into a tissue or into your sleeve (not your hands). . Avoid shaking hands with others and consider head nods or verbal greetings only. . Avoid touching your eyes, nose, or mouth with unwashed hands.  . Avoid close contact with people who are sick. . Avoid places or events with large numbers of people in one location, like concerts or sporting events. . Carefully consider travel plans you have  or are making. . If you are planning any travel outside or inside the [place], visit the CDC's Travelers' Health webpage for the latest health notices. . If you have some symptoms but not all symptoms, continue to monitor at home and seek medical  attention if your symptoms worsen. . If you are having a medical emergency, call 911.   [place] / e-Visit: [email or web address]         [facility]: [facility]: [phone]                   [facility]: [phone]

## 2019-09-08 NOTE — Progress Notes (Signed)
Pt's creatnine 1.88 today, ok to treat per Dr. Lindi Adie

## 2019-09-08 NOTE — Telephone Encounter (Signed)
Please advise 

## 2019-09-08 NOTE — Telephone Encounter (Signed)
Thanks cindy

## 2019-09-08 NOTE — Progress Notes (Signed)
Medical screening examination/treatment/procedure(s) were performed by non-physician practitioner and as supervising physician I was immediately available for consultation/collaboration. I agree with above. Yoshua Geisinger, MD   

## 2019-09-10 ENCOUNTER — Other Ambulatory Visit: Payer: Self-pay

## 2019-09-10 ENCOUNTER — Inpatient Hospital Stay: Payer: Medicare Other

## 2019-09-10 VITALS — BP 144/77 | HR 84 | Temp 98.4°F | Resp 18

## 2019-09-10 DIAGNOSIS — C50812 Malignant neoplasm of overlapping sites of left female breast: Secondary | ICD-10-CM | POA: Diagnosis not present

## 2019-09-10 DIAGNOSIS — Z5189 Encounter for other specified aftercare: Secondary | ICD-10-CM | POA: Diagnosis not present

## 2019-09-10 DIAGNOSIS — C773 Secondary and unspecified malignant neoplasm of axilla and upper limb lymph nodes: Secondary | ICD-10-CM | POA: Diagnosis not present

## 2019-09-10 DIAGNOSIS — Z5112 Encounter for antineoplastic immunotherapy: Secondary | ICD-10-CM | POA: Diagnosis not present

## 2019-09-10 DIAGNOSIS — Z5111 Encounter for antineoplastic chemotherapy: Secondary | ICD-10-CM | POA: Diagnosis not present

## 2019-09-10 MED ORDER — PEGFILGRASTIM-CBQV 6 MG/0.6ML ~~LOC~~ SOSY
PREFILLED_SYRINGE | SUBCUTANEOUS | Status: AC
  Filled 2019-09-10: qty 0.6

## 2019-09-10 MED ORDER — PEGFILGRASTIM-CBQV 6 MG/0.6ML ~~LOC~~ SOSY
6.0000 mg | PREFILLED_SYRINGE | Freq: Once | SUBCUTANEOUS | Status: AC
  Administered 2019-09-10: 6 mg via SUBCUTANEOUS

## 2019-09-10 NOTE — Patient Instructions (Signed)

## 2019-09-21 ENCOUNTER — Other Ambulatory Visit: Payer: Self-pay

## 2019-09-21 ENCOUNTER — Inpatient Hospital Stay (HOSPITAL_COMMUNITY): Payer: Medicare Other

## 2019-09-21 ENCOUNTER — Inpatient Hospital Stay (HOSPITAL_COMMUNITY)
Admission: EM | Admit: 2019-09-21 | Discharge: 2019-10-03 | DRG: 683 | Disposition: A | Discharge status: Home / Self Care | Payer: Medicare Other | Attending: Internal Medicine | Admitting: Internal Medicine

## 2019-09-21 ENCOUNTER — Encounter (HOSPITAL_COMMUNITY): Payer: Self-pay | Admitting: *Deleted

## 2019-09-21 DIAGNOSIS — J45909 Unspecified asthma, uncomplicated: Secondary | ICD-10-CM | POA: Diagnosis present

## 2019-09-21 DIAGNOSIS — E878 Other disorders of electrolyte and fluid balance, not elsewhere classified: Secondary | ICD-10-CM | POA: Diagnosis present

## 2019-09-21 DIAGNOSIS — I351 Nonrheumatic aortic (valve) insufficiency: Secondary | ICD-10-CM | POA: Diagnosis present

## 2019-09-21 DIAGNOSIS — E870 Hyperosmolality and hypernatremia: Secondary | ICD-10-CM | POA: Diagnosis not present

## 2019-09-21 DIAGNOSIS — Z743 Need for continuous supervision: Secondary | ICD-10-CM | POA: Diagnosis not present

## 2019-09-21 DIAGNOSIS — Z95828 Presence of other vascular implants and grafts: Secondary | ICD-10-CM

## 2019-09-21 DIAGNOSIS — T458X5A Adverse effect of other primarily systemic and hematological agents, initial encounter: Secondary | ICD-10-CM | POA: Diagnosis not present

## 2019-09-21 DIAGNOSIS — E785 Hyperlipidemia, unspecified: Secondary | ICD-10-CM | POA: Diagnosis not present

## 2019-09-21 DIAGNOSIS — I509 Heart failure, unspecified: Secondary | ICD-10-CM

## 2019-09-21 DIAGNOSIS — E872 Acidosis: Secondary | ICD-10-CM | POA: Diagnosis present

## 2019-09-21 DIAGNOSIS — I5032 Chronic diastolic (congestive) heart failure: Secondary | ICD-10-CM | POA: Diagnosis present

## 2019-09-21 DIAGNOSIS — Z171 Estrogen receptor negative status [ER-]: Secondary | ICD-10-CM

## 2019-09-21 DIAGNOSIS — Z79899 Other long term (current) drug therapy: Secondary | ICD-10-CM

## 2019-09-21 DIAGNOSIS — E871 Hypo-osmolality and hyponatremia: Secondary | ICD-10-CM | POA: Diagnosis not present

## 2019-09-21 DIAGNOSIS — C50812 Malignant neoplasm of overlapping sites of left female breast: Secondary | ICD-10-CM | POA: Diagnosis not present

## 2019-09-21 DIAGNOSIS — E669 Obesity, unspecified: Secondary | ICD-10-CM | POA: Diagnosis present

## 2019-09-21 DIAGNOSIS — I10 Essential (primary) hypertension: Secondary | ICD-10-CM | POA: Diagnosis not present

## 2019-09-21 DIAGNOSIS — E860 Dehydration: Secondary | ICD-10-CM

## 2019-09-21 DIAGNOSIS — R1111 Vomiting without nausea: Secondary | ICD-10-CM | POA: Diagnosis not present

## 2019-09-21 DIAGNOSIS — R197 Diarrhea, unspecified: Secondary | ICD-10-CM | POA: Diagnosis not present

## 2019-09-21 DIAGNOSIS — Z86718 Personal history of other venous thrombosis and embolism: Secondary | ICD-10-CM | POA: Diagnosis not present

## 2019-09-21 DIAGNOSIS — D72829 Elevated white blood cell count, unspecified: Secondary | ICD-10-CM | POA: Diagnosis not present

## 2019-09-21 DIAGNOSIS — R1110 Vomiting, unspecified: Secondary | ICD-10-CM | POA: Diagnosis not present

## 2019-09-21 DIAGNOSIS — N183 Chronic kidney disease, stage 3 unspecified: Secondary | ICD-10-CM | POA: Diagnosis not present

## 2019-09-21 DIAGNOSIS — I2510 Atherosclerotic heart disease of native coronary artery without angina pectoris: Secondary | ICD-10-CM | POA: Diagnosis present

## 2019-09-21 DIAGNOSIS — I130 Hypertensive heart and chronic kidney disease with heart failure and stage 1 through stage 4 chronic kidney disease, or unspecified chronic kidney disease: Secondary | ICD-10-CM | POA: Diagnosis not present

## 2019-09-21 DIAGNOSIS — I429 Cardiomyopathy, unspecified: Secondary | ICD-10-CM | POA: Diagnosis not present

## 2019-09-21 DIAGNOSIS — R112 Nausea with vomiting, unspecified: Secondary | ICD-10-CM

## 2019-09-21 DIAGNOSIS — D72828 Other elevated white blood cell count: Secondary | ICD-10-CM | POA: Diagnosis not present

## 2019-09-21 DIAGNOSIS — E876 Hypokalemia: Secondary | ICD-10-CM | POA: Diagnosis not present

## 2019-09-21 DIAGNOSIS — R531 Weakness: Secondary | ICD-10-CM | POA: Diagnosis not present

## 2019-09-21 DIAGNOSIS — D6959 Other secondary thrombocytopenia: Secondary | ICD-10-CM | POA: Diagnosis not present

## 2019-09-21 DIAGNOSIS — D696 Thrombocytopenia, unspecified: Secondary | ICD-10-CM

## 2019-09-21 DIAGNOSIS — D4102 Neoplasm of uncertain behavior of left kidney: Secondary | ICD-10-CM | POA: Diagnosis not present

## 2019-09-21 DIAGNOSIS — Z6833 Body mass index (BMI) 33.0-33.9, adult: Secondary | ICD-10-CM

## 2019-09-21 DIAGNOSIS — N179 Acute kidney failure, unspecified: Principal | ICD-10-CM

## 2019-09-21 DIAGNOSIS — Z20822 Contact with and (suspected) exposure to covid-19: Secondary | ICD-10-CM | POA: Diagnosis present

## 2019-09-21 DIAGNOSIS — N2889 Other specified disorders of kidney and ureter: Secondary | ICD-10-CM | POA: Diagnosis not present

## 2019-09-21 DIAGNOSIS — R110 Nausea: Secondary | ICD-10-CM | POA: Diagnosis not present

## 2019-09-21 DIAGNOSIS — D649 Anemia, unspecified: Secondary | ICD-10-CM | POA: Diagnosis present

## 2019-09-21 DIAGNOSIS — Z7901 Long term (current) use of anticoagulants: Secondary | ICD-10-CM

## 2019-09-21 DIAGNOSIS — Z87891 Personal history of nicotine dependence: Secondary | ICD-10-CM

## 2019-09-21 DIAGNOSIS — Z803 Family history of malignant neoplasm of breast: Secondary | ICD-10-CM

## 2019-09-21 DIAGNOSIS — I110 Hypertensive heart disease with heart failure: Secondary | ICD-10-CM | POA: Diagnosis present

## 2019-09-21 DIAGNOSIS — T451X5A Adverse effect of antineoplastic and immunosuppressive drugs, initial encounter: Secondary | ICD-10-CM

## 2019-09-21 DIAGNOSIS — D6481 Anemia due to antineoplastic chemotherapy: Secondary | ICD-10-CM | POA: Diagnosis not present

## 2019-09-21 DIAGNOSIS — Z800 Family history of malignant neoplasm of digestive organs: Secondary | ICD-10-CM

## 2019-09-21 DIAGNOSIS — Z808 Family history of malignant neoplasm of other organs or systems: Secondary | ICD-10-CM

## 2019-09-21 HISTORY — DX: Nausea with vomiting, unspecified: R11.2

## 2019-09-21 LAB — CBC WITH DIFFERENTIAL/PLATELET
Abs Immature Granulocytes: 0.9 10*3/uL — ABNORMAL HIGH (ref 0.00–0.07)
Basophils Absolute: 0.2 10*3/uL — ABNORMAL HIGH (ref 0.0–0.1)
Basophils Relative: 1 %
Eosinophils Absolute: 0 10*3/uL (ref 0.0–0.5)
Eosinophils Relative: 0 %
HCT: 41.7 % (ref 36.0–46.0)
Hemoglobin: 12.4 g/dL (ref 12.0–15.0)
Immature Granulocytes: 4 %
Lymphocytes Relative: 9 %
Lymphs Abs: 2.1 10*3/uL (ref 0.7–4.0)
MCH: 29.3 pg (ref 26.0–34.0)
MCHC: 29.7 g/dL — ABNORMAL LOW (ref 30.0–36.0)
MCV: 98.6 fL (ref 80.0–100.0)
Monocytes Absolute: 1.1 10*3/uL — ABNORMAL HIGH (ref 0.1–1.0)
Monocytes Relative: 4 %
Neutro Abs: 20.5 10*3/uL — ABNORMAL HIGH (ref 1.7–7.7)
Neutrophils Relative %: 82 %
Platelets: 93 10*3/uL — ABNORMAL LOW (ref 150–400)
RBC: 4.23 MIL/uL (ref 3.87–5.11)
RDW: 16.6 % — ABNORMAL HIGH (ref 11.5–15.5)
WBC: 24.8 10*3/uL — ABNORMAL HIGH (ref 4.0–10.5)
nRBC: 0.2 % (ref 0.0–0.2)

## 2019-09-21 LAB — COMPREHENSIVE METABOLIC PANEL
ALT: 23 U/L (ref 0–44)
AST: 36 U/L (ref 15–41)
Albumin: 3.6 g/dL (ref 3.5–5.0)
Alkaline Phosphatase: 154 U/L — ABNORMAL HIGH (ref 38–126)
Anion gap: 17 — ABNORMAL HIGH (ref 5–15)
BUN: 57 mg/dL — ABNORMAL HIGH (ref 8–23)
CO2: 22 mmol/L (ref 22–32)
Calcium: 9.1 mg/dL (ref 8.9–10.3)
Chloride: 118 mmol/L — ABNORMAL HIGH (ref 98–111)
Creatinine, Ser: 3.13 mg/dL — ABNORMAL HIGH (ref 0.44–1.00)
GFR calc Af Amer: 17 mL/min — ABNORMAL LOW (ref >60)
GFR calc non Af Amer: 15 mL/min — ABNORMAL LOW (ref >60)
Glucose, Bld: 107 mg/dL — ABNORMAL HIGH (ref 70–99)
Potassium: 3.9 mmol/L (ref 3.5–5.1)
Sodium: 157 mmol/L — ABNORMAL HIGH (ref 135–145)
Total Bilirubin: 0.7 mg/dL (ref 0.3–1.2)
Total Protein: 6.6 g/dL (ref 6.5–8.1)

## 2019-09-21 LAB — PROTIME-INR
INR: 1.1 (ref 0.8–1.2)
Prothrombin Time: 14.3 seconds (ref 11.4–15.2)

## 2019-09-21 LAB — POC SARS CORONAVIRUS 2 AG -  ED: SARS Coronavirus 2 Ag: NEGATIVE

## 2019-09-21 LAB — PHOSPHORUS: Phosphorus: 3.4 mg/dL (ref 2.5–4.6)

## 2019-09-21 LAB — SARS CORONAVIRUS 2 (TAT 6-24 HRS): SARS Coronavirus 2: NEGATIVE

## 2019-09-21 LAB — LACTIC ACID, PLASMA: Lactic Acid, Venous: 1.8 mmol/L (ref 0.5–1.9)

## 2019-09-21 LAB — MAGNESIUM: Magnesium: 2.3 mg/dL (ref 1.7–2.4)

## 2019-09-21 IMAGING — CT CT ABD-PELV W/O CM
2 of 4 series · 17 of 46 positions shown, 19 images · non-contrast
Comparison: [DATE]

CLINICAL DATA: Weakness and nausea and vomiting for 1 week.
Undergoing chemotherapy for breast carcinoma.

EXAM:
CT ABDOMEN AND PELVIS WITHOUT CONTRAST
TECHNIQUE: Multidetector CT imaging of the abdomen and pelvis was performed
following the standard protocol without IV contrast.

[Series 2: axial st · axial · 0.87mm/px · z∈[+834,+1239]mm · 14 of 91 slices shown, 16 images]
[im 5/91  soft-tissue]
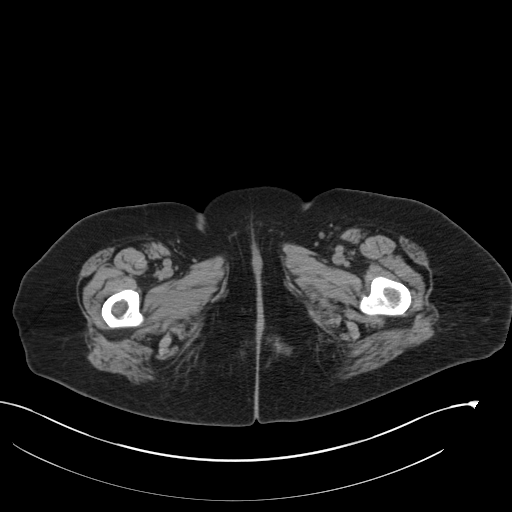
[im 5/91  bone]
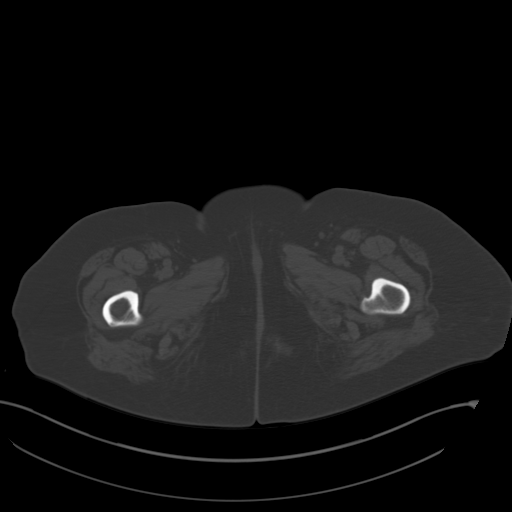
[im 10/91  soft-tissue]
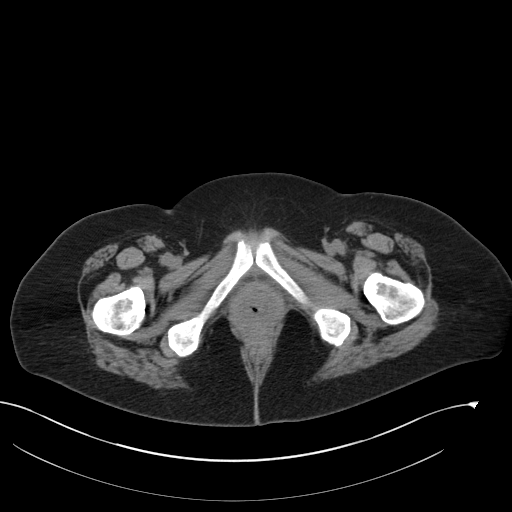
[im 19/91  soft-tissue]
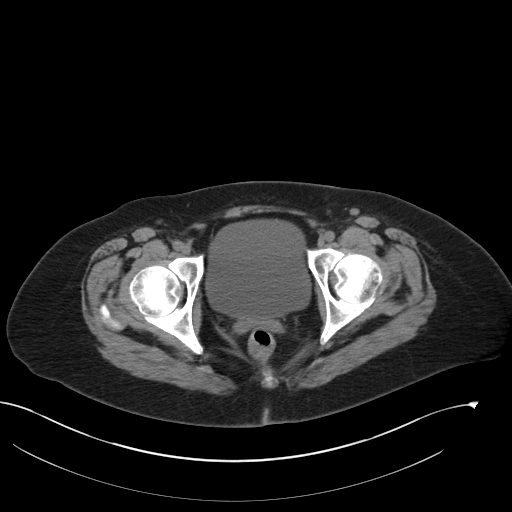
[im 24/91  soft-tissue]
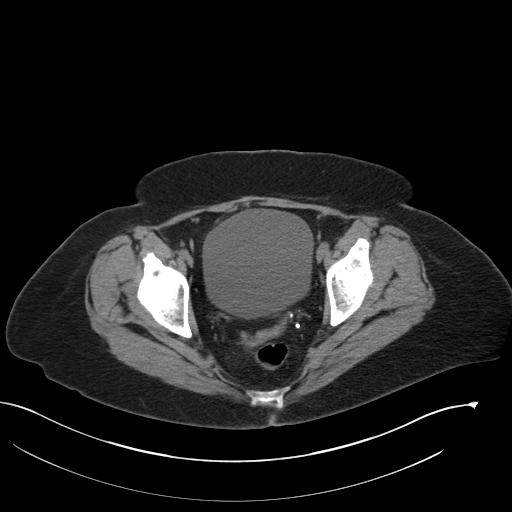
[im 29/91  soft-tissue]
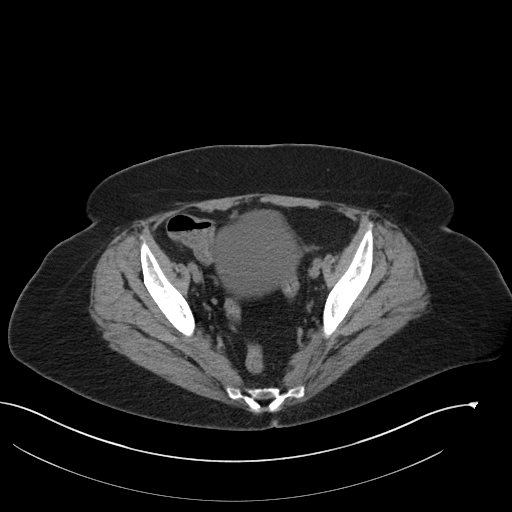
[im 38/91  soft-tissue]
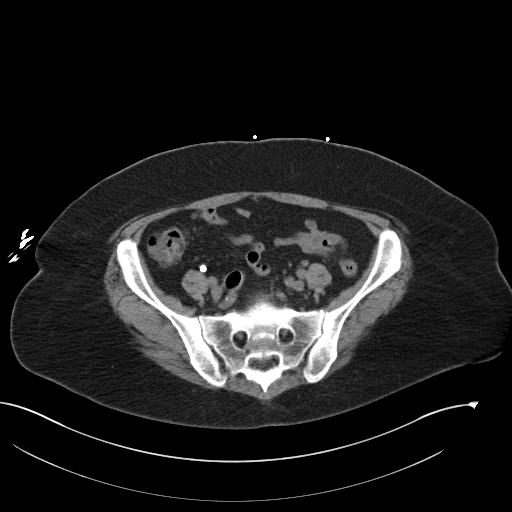
[im 43/91  soft-tissue]
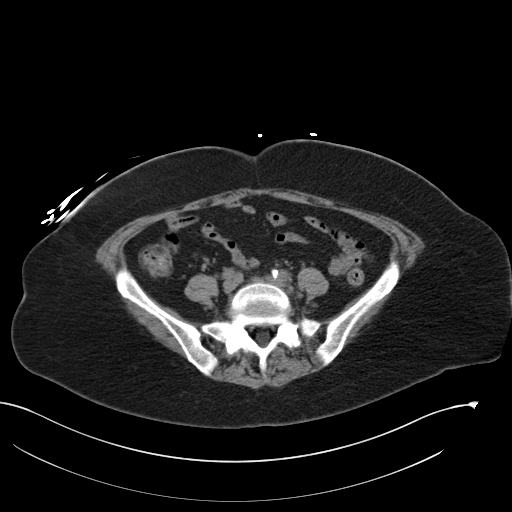
[im 48/91  soft-tissue]
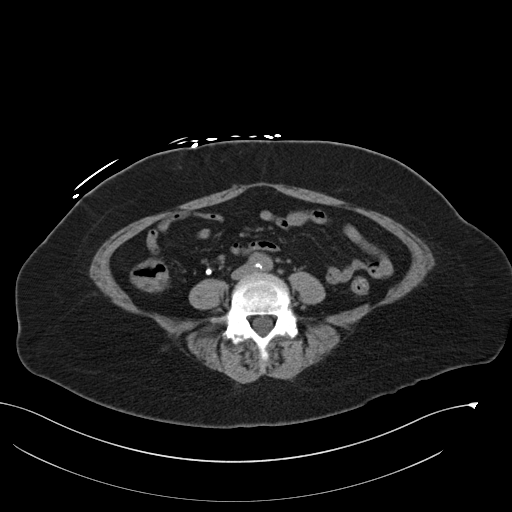
[im 53/91  soft-tissue]
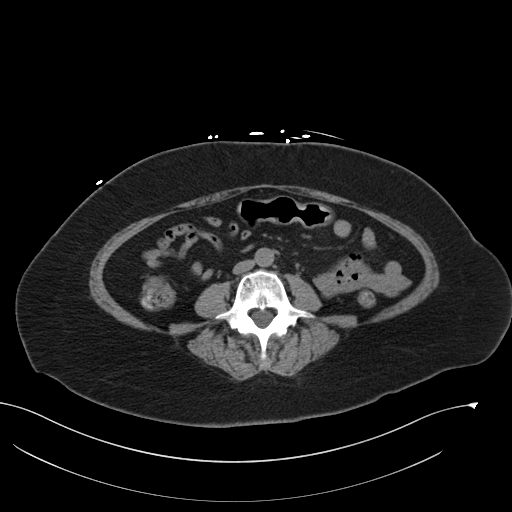
[im 53/91  bone]
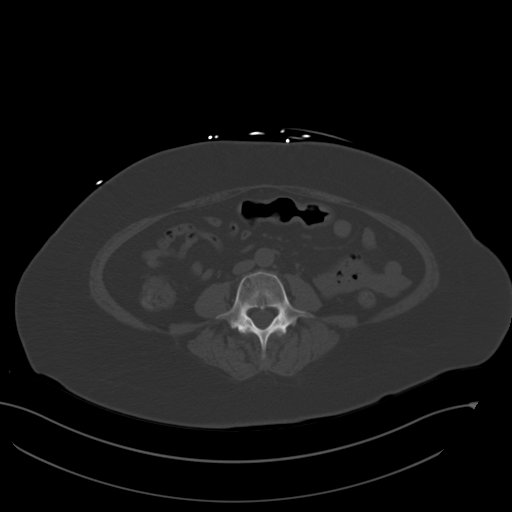
[im 62/91  soft-tissue]
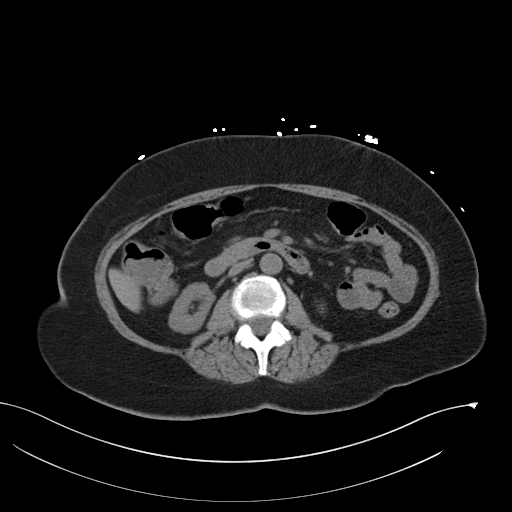
[im 67/91  soft-tissue]
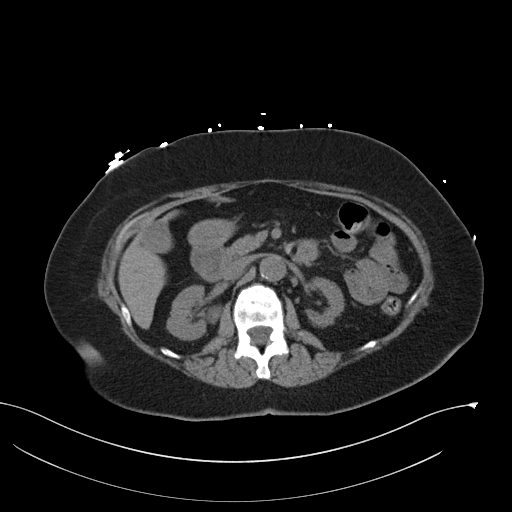
[im 72/91  soft-tissue]
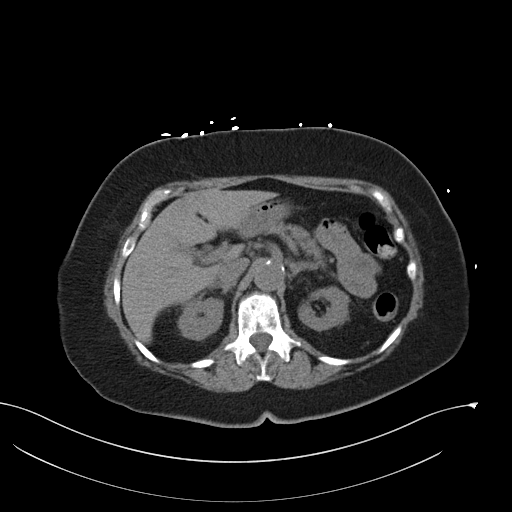
[im 81/91  soft-tissue]
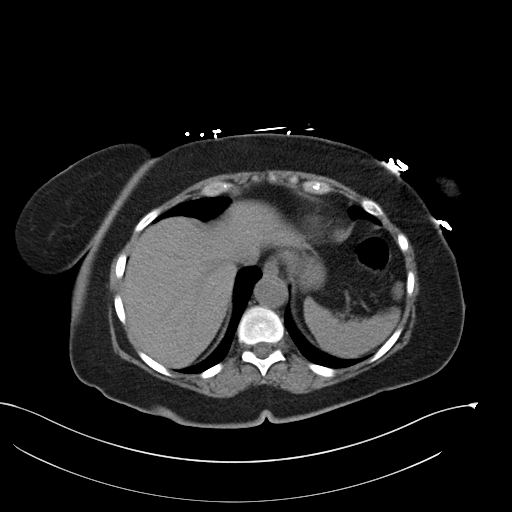
[im 86/91  soft-tissue]
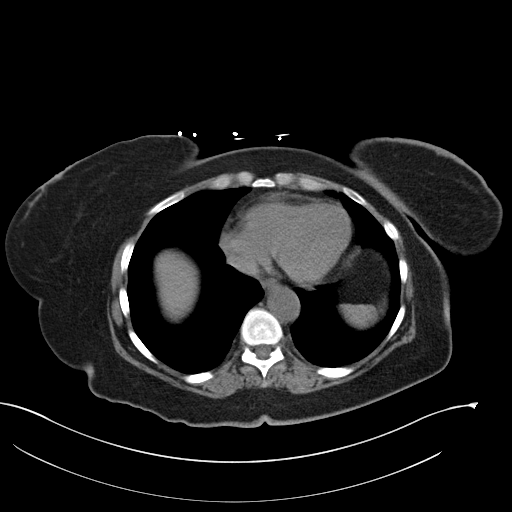

[Series 4: coronal st · coronal · 0.94mm/px · 3 of 121 slices shown]
[im 41/121  soft-tissue]
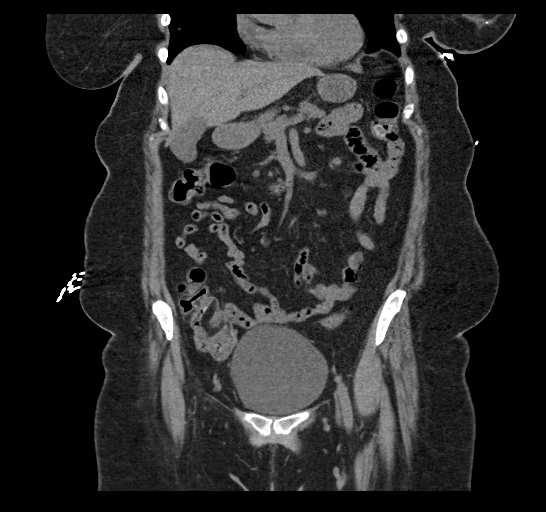
[im 54/121  soft-tissue]
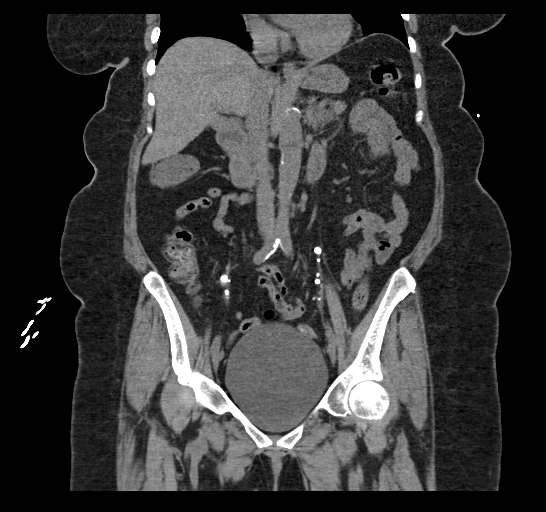
[im 67/121  soft-tissue]
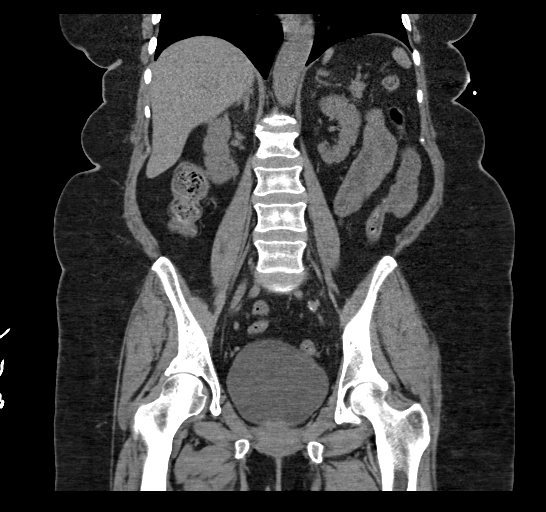

[17 of 46 positions shown; findings below may reference images not displayed]

FINDINGS: Lower chest: No acute findings.

Hepatobiliary: No mass visualized on this unenhanced exam. Focal
fatty infiltration again noted adjacent to the falciform ligament.
Gallbladder is unremarkable. No evidence of biliary ductal
dilatation.

Pancreas: No mass or inflammatory process visualized on this
unenhanced exam.

Spleen:  Within normal limits in size.

Adrenals/Urinary tract: No evidence of urolithiasis or
hydronephrosis. A tiny subcapsular lesion is again seen in the
posterior upper pole the left kidney which demonstrate contrast
enhancement on prior exam, suspicious for small renal cell
carcinoma. Unremarkable unopacified urinary bladder.

Stomach/Bowel: No evidence of obstruction, inflammatory process, or
abnormal fluid collections. Normal appendix visualized.

Vascular/Lymphatic: No pathologically enlarged lymph nodes
identified. No evidence of abdominal aortic aneurysm. Aortic
atherosclerosis incidentally noted.

Reproductive: Prior hysterectomy noted. Adnexal regions are
unremarkable in appearance.

Other:  None.

Musculoskeletal: No suspicious bone lesions identified. Stable mild
compression fracture deformity of the L2 vertebral body.
IMPRESSION: No acute findings.

Tiny subcapsular lesion in posterior upper pole of left kidney which
showed contrast enhancement on prior exam, suspicious for renal cell
carcinoma.

## 2019-09-21 IMAGING — DX DG CHEST 1V PORT
1 series · 1 of 1 positions shown · non-contrast
Comparison: [DATE]

CLINICAL DATA: Generalized weakness

EXAM:
PORTABLE CHEST 1 VIEW

[chest ap]
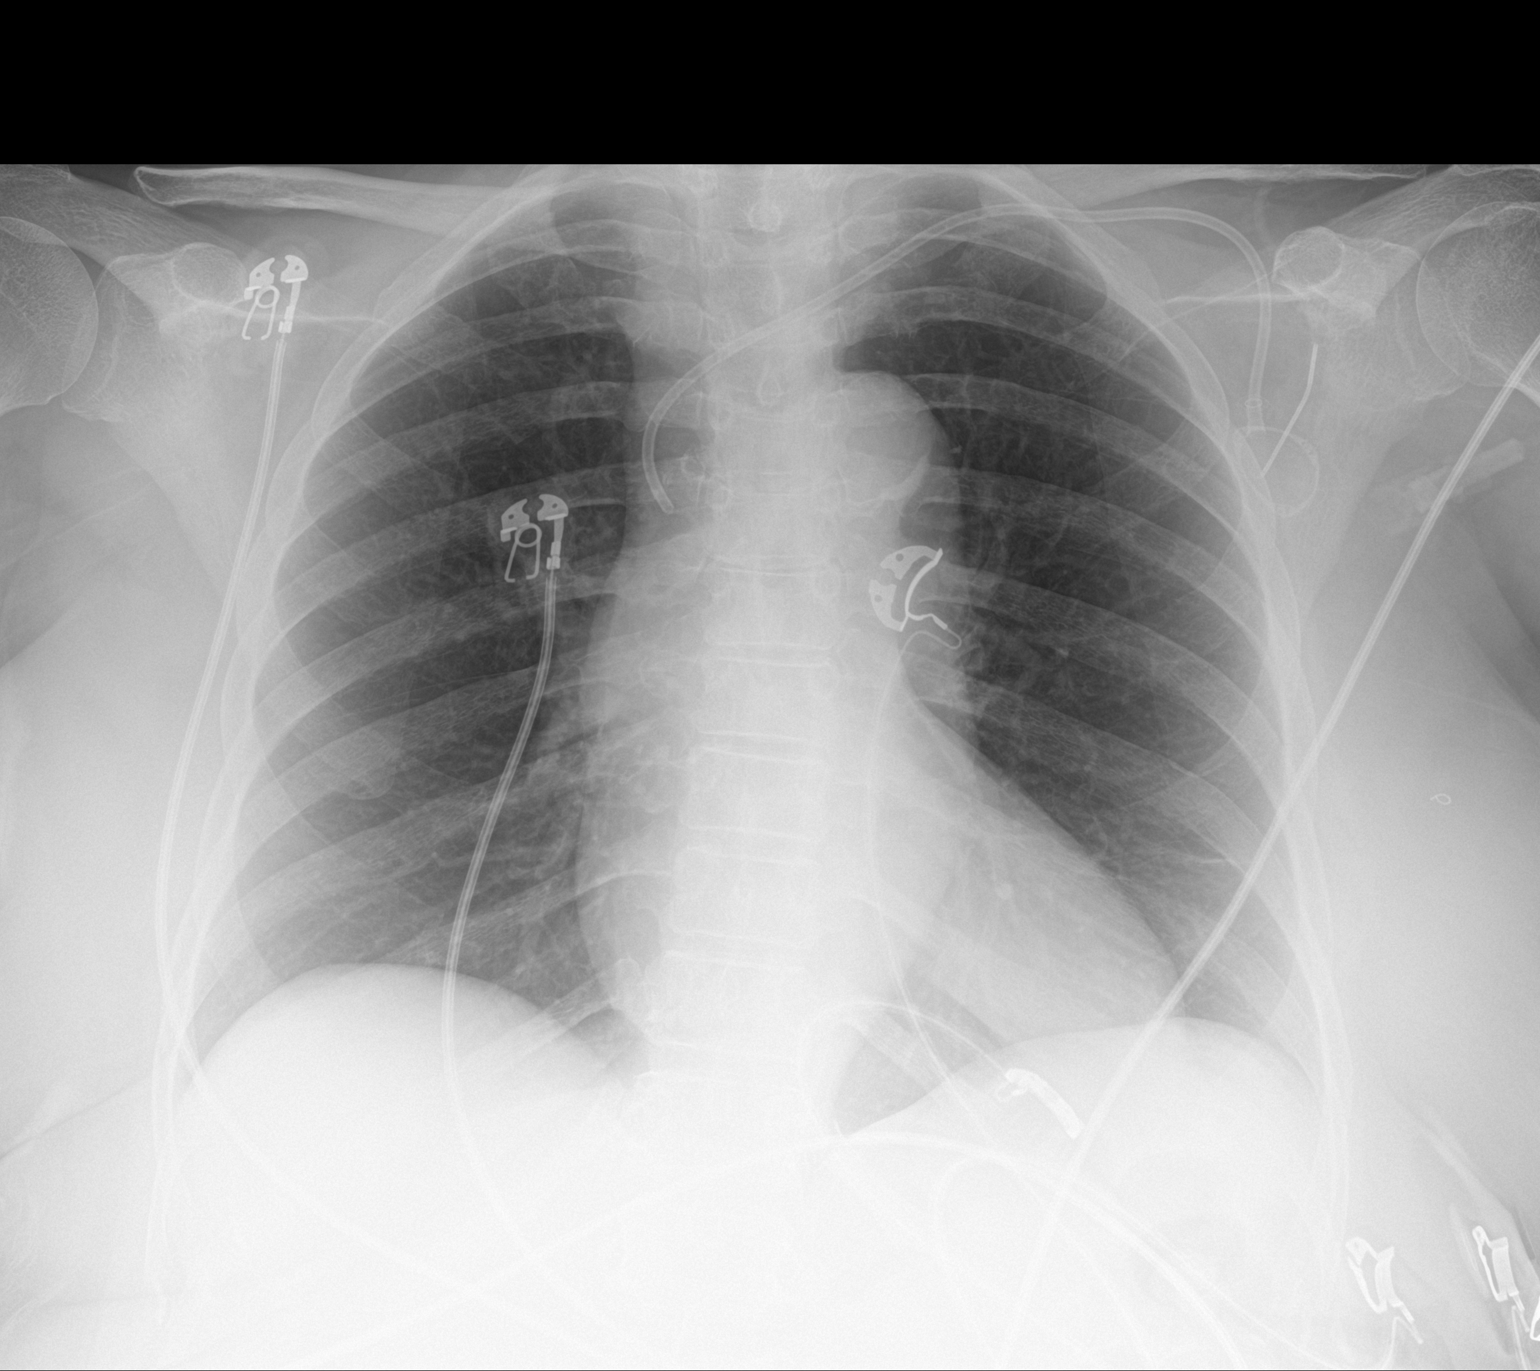

[1 of 1 positions shown; findings below may reference images not displayed]

FINDINGS: The heart size and mediastinal contours are within normal limits. A
left-sided MediPort catheter seen with the tip at the superior SVC.
Aortic knob calcifications. Both lungs are clear. The visualized
skeletal structures are unremarkable.
IMPRESSION: No active disease.

## 2019-09-21 MED ORDER — LORATADINE 10 MG PO TABS
10.0000 mg | ORAL_TABLET | Freq: Every day | ORAL | Status: DC
  Administered 2019-09-22 – 2019-10-03 (×8): 10 mg via ORAL
  Filled 2019-09-21 (×11): qty 1

## 2019-09-21 MED ORDER — DEXTROSE-NACL 5-0.45 % IV SOLN: INTRAVENOUS | Status: DC

## 2019-09-21 MED ORDER — ATORVASTATIN CALCIUM 10 MG PO TABS
20.0000 mg | ORAL_TABLET | Freq: Every day | ORAL | Status: DC
  Administered 2019-09-21 – 2019-10-02 (×11): 20 mg via ORAL
  Filled 2019-09-21 (×3): qty 2
  Filled 2019-09-21: qty 1
  Filled 2019-09-21 (×7): qty 2

## 2019-09-21 MED ORDER — ALTEPLASE 2 MG IJ SOLR
2.0000 mg | Freq: Once | INTRAMUSCULAR | Status: AC
  Administered 2019-09-21: 2 mg
  Filled 2019-09-21: qty 2

## 2019-09-21 MED ORDER — SODIUM CHLORIDE 0.9% FLUSH
10.0000 mL | Freq: Two times a day (BID) | INTRAVENOUS | Status: DC
  Administered 2019-09-21 – 2019-09-27 (×8): 10 mL
  Administered 2019-09-28: 20 mL
  Administered 2019-09-29 – 2019-10-03 (×8): 10 mL

## 2019-09-21 MED ORDER — AMLODIPINE BESYLATE 10 MG PO TABS
10.0000 mg | ORAL_TABLET | Freq: Every day | ORAL | Status: DC
  Administered 2019-09-21 – 2019-10-03 (×13): 10 mg via ORAL
  Filled 2019-09-21 (×10): qty 1
  Filled 2019-09-21: qty 2
  Filled 2019-09-21 (×2): qty 1

## 2019-09-21 MED ORDER — ACETAMINOPHEN 650 MG RE SUPP: 650.0000 mg | Freq: Four times a day (QID) | RECTAL | Status: DC | PRN

## 2019-09-21 MED ORDER — ACETAMINOPHEN 325 MG PO TABS
650.0000 mg | ORAL_TABLET | Freq: Four times a day (QID) | ORAL | Status: DC | PRN
  Filled 2019-09-21 (×2): qty 2

## 2019-09-21 MED ORDER — METOPROLOL TARTRATE 50 MG PO TABS
50.0000 mg | ORAL_TABLET | Freq: Two times a day (BID) | ORAL | Status: DC
  Administered 2019-09-22 – 2019-10-03 (×21): 50 mg via ORAL
  Filled 2019-09-21 (×20): qty 1
  Filled 2019-09-21: qty 4
  Filled 2019-09-21: qty 1
  Filled 2019-09-21: qty 2
  Filled 2019-09-21: qty 1

## 2019-09-21 MED ORDER — VITAMIN D 25 MCG (1000 UNIT) PO TABS
2000.0000 [IU] | ORAL_TABLET | Freq: Every day | ORAL | Status: DC
  Administered 2019-09-22 – 2019-10-03 (×12): 2000 [IU] via ORAL
  Filled 2019-09-21 (×12): qty 2

## 2019-09-21 MED ORDER — SALINE SPRAY 0.65 % NA SOLN
1.0000 | NASAL | Status: DC | PRN
  Filled 2019-09-21: qty 44

## 2019-09-21 MED ORDER — PROCHLORPERAZINE EDISYLATE 10 MG/2ML IJ SOLN: 10.0000 mg | Freq: Four times a day (QID) | INTRAMUSCULAR | Status: DC | PRN

## 2019-09-21 MED ORDER — SODIUM CHLORIDE 0.9 % IV BOLUS
1000.0000 mL | Freq: Once | INTRAVENOUS | Status: AC
  Administered 2019-09-21: 16:00:00 1000 mL via INTRAVENOUS

## 2019-09-21 MED ORDER — PROCHLORPERAZINE MALEATE 10 MG PO TABS
10.0000 mg | ORAL_TABLET | Freq: Four times a day (QID) | ORAL | Status: DC | PRN
  Administered 2019-09-21: 10 mg via ORAL
  Filled 2019-09-21: qty 1

## 2019-09-21 MED ORDER — ALBUTEROL SULFATE (2.5 MG/3ML) 0.083% IN NEBU: 2.5000 mg | INHALATION_SOLUTION | Freq: Four times a day (QID) | RESPIRATORY_TRACT | Status: DC | PRN

## 2019-09-21 MED ORDER — CARBOXYMETHYLCELLUL-GLYCERIN 1-0.25 % OP SOLN: 1.0000 [drp] | Freq: Two times a day (BID) | OPHTHALMIC | Status: DC | PRN

## 2019-09-21 MED ORDER — WARFARIN SODIUM 5 MG PO TABS
5.0000 mg | ORAL_TABLET | Freq: Every day | ORAL | Status: DC
  Administered 2019-09-21 – 2019-09-22 (×2): 5 mg via ORAL
  Filled 2019-09-21 (×2): qty 1

## 2019-09-21 MED ORDER — HYDRALAZINE HCL 50 MG PO TABS
50.0000 mg | ORAL_TABLET | Freq: Three times a day (TID) | ORAL | Status: DC
  Administered 2019-09-21 – 2019-10-03 (×35): 50 mg via ORAL
  Filled 2019-09-21 (×20): qty 1
  Filled 2019-09-21: qty 5
  Filled 2019-09-21 (×13): qty 1
  Filled 2019-09-21: qty 5

## 2019-09-21 MED ORDER — SODIUM CHLORIDE 0.9% FLUSH: 10.0000 mL | INTRAVENOUS | Status: DC | PRN

## 2019-09-21 MED ORDER — POLYETHYLENE GLYCOL 3350 17 G PO PACK: 17.0000 g | PACK | Freq: Every day | ORAL | Status: DC | PRN

## 2019-09-21 MED ORDER — POLYVINYL ALCOHOL 1.4 % OP SOLN
1.0000 [drp] | OPHTHALMIC | Status: DC | PRN
  Filled 2019-09-21: qty 15

## 2019-09-21 MED ORDER — MAGIC MOUTHWASH W/LIDOCAINE
5.0000 mL | Freq: Three times a day (TID) | ORAL | Status: DC | PRN
  Filled 2019-09-21: qty 5

## 2019-09-21 MED ORDER — ONDANSETRON HCL 4 MG/2ML IJ SOLN
4.0000 mg | Freq: Once | INTRAMUSCULAR | Status: AC
  Administered 2019-09-21: 4 mg via INTRAVENOUS
  Filled 2019-09-21: qty 2

## 2019-09-21 MED ORDER — SODIUM CHLORIDE 0.9 % IV BOLUS
1000.0000 mL | Freq: Once | INTRAVENOUS | Status: AC
  Administered 2019-09-21: 1000 mL via INTRAVENOUS

## 2019-09-21 MED ORDER — FLUTICASONE PROPIONATE 50 MCG/ACT NA SUSP
2.0000 | Freq: Every day | NASAL | Status: DC
  Administered 2019-09-22 – 2019-10-03 (×11): 2 via NASAL
  Filled 2019-09-21: qty 16

## 2019-09-21 MED ORDER — WARFARIN - PHARMACIST DOSING INPATIENT: Freq: Every day | Status: DC

## 2019-09-21 MED ORDER — CHLORHEXIDINE GLUCONATE CLOTH 2 % EX PADS
6.0000 | MEDICATED_PAD | Freq: Every day | CUTANEOUS | Status: DC
  Administered 2019-09-22 – 2019-10-02 (×11): 6 via TOPICAL

## 2019-09-21 MED ORDER — LORAZEPAM 0.5 MG PO TABS
0.5000 mg | ORAL_TABLET | Freq: Every evening | ORAL | Status: DC | PRN
  Administered 2019-09-22: 0.5 mg via ORAL
  Filled 2019-09-21: qty 1

## 2019-09-21 MED ORDER — WARFARIN SODIUM 2.5 MG PO TABS: 2.5000 mg | ORAL_TABLET | ORAL | Status: DC

## 2019-09-21 MED ORDER — BISACODYL 10 MG RE SUPP: 10.0000 mg | Freq: Every day | RECTAL | Status: DC | PRN

## 2019-09-21 NOTE — Progress Notes (Signed)
ANTICOAGULATION CONSULT NOTE - Initial Consult  Pharmacy Consult for warfarin Indication: VTE treatment  Allergies  Allergen Reactions  . Penicillins Rash    Did it involve swelling of the face/tongue/throat, SOB, or low BP? Unknown Did it involve sudden or severe rash/hives, skin peeling, or any reaction on the inside of your mouth or nose? Yes Did you need to seek medical attention at a hospital or doctor's office? Yes When did it last happen?in her 47s If all above answers are "NO", may proceed with cephalosporin use.   . Tetanus Toxoid Rash    Caused cellulitis   . Aspirin Rash    Patient Measurements: Height: 5\' 5"  (165.1 cm) Weight: 192 lb (87.1 kg) IBW/kg (Calculated) : 57 Heparin Dosing Weight:   Vital Signs: Temp: 97.5 F (36.4 C) (12/20 1220) Temp Source: Oral (12/20 1220) BP: 137/87 (12/20 2037) Pulse Rate: 109 (12/20 2037)  Labs: Recent Labs    09/21/19 1336 09/21/19 1801  HGB 12.4  --   HCT 41.7  --   PLT 93*  --   LABPROT  --  14.3  INR  --  1.1  CREATININE 3.13*  --     Estimated Creatinine Clearance: 19 mL/min (A) (by C-G formula based on SCr of 3.13 mg/dL (H)).   Medical History: Past Medical History:  Diagnosis Date  . Allergic rhinitis   . Aortic valve disorders   . Arthritis   . Asthma   . CHF (congestive heart failure) ([place])   . Coronary artery disease   . Family history of adverse reaction to anesthesia    difficulty waking mother up after surgery  . Family history of breast cancer   . Family history of cervical cancer   . Family history of colon cancer   . Family history of throat cancer   . Heart murmur   . Hyperlipidemia   . Hypertension   . Long term (current) use of anticoagulants   . Other primary cardiomyopathies   . Personal history of colonic polyps   . Personal history of venous thrombosis and embolism     Medications:  (Not in a hospital admission)  Scheduled:  . amLODipine  10 mg Oral Daily  .  atorvastatin  20 mg Oral q1800  . Chlorhexidine Gluconate Cloth  6 each Topical Daily  . fluticasone  2 spray Each Nare Daily  . hydrALAZINE  50 mg Oral TID  . loratadine  10 mg Oral Daily  . metoprolol tartrate  50 mg Oral BID  . sodium chloride flush  10-40 mL Intracatheter Q12H  . Vitamin D  2,000 Units Oral Daily  . warfarin  5 mg Oral q1800  . [START ON 09/22/2019] Warfarin - Pharmacist Dosing Inpatient   Does not apply q1800    Assessment: Patient with chronic warfarin for hx of VTE.  Last dose > 7 days prior to admit per med rec.  INR 1.1 on admit.    Goal of Therapy:  INR 2-3    Plan:  Daily INR Warfarin 5mg  po daily, with first dose now  Tyler Deis, Shea Stakes [place] 09/21/2019,9:25 PM

## 2019-09-21 NOTE — ED Provider Notes (Signed)
[facility] DEPT Provider Note   CSN: [id number] Arrival date & time: 09/21/19  1212     History Chief Complaint  Patient presents with  . Weakness  . Nausea    [name] Foster is a 68 y.o. female w PMHx breast cancer, CAD, CHF, presenting to the emergency department with complaint of generalized weakness with nausea and vomiting that began about 4 days after her last chemotherapy treatment on 09/10/2019.  Per documentation this is her last chemotherapy treatment until December 29.  She states she began having the nausea vomiting about 4 days after her treatment and has been persisting since then.  She has been unable to keep any liquids down and has developed a generalized weakness and fatigue.  She also reports rhinorrhea.  She denies abdominal pain, cough, fever, dysuria.  No known Covid contacts.  Nulasta on 09/10/2019.  The history is provided by the patient.       Past Medical History:  Diagnosis Date  . Allergic rhinitis   . Aortic valve disorders   . Arthritis   . Asthma   . CHF (congestive heart failure) ([place])   . Coronary artery disease   . Family history of adverse reaction to anesthesia    difficulty waking mother up after surgery  . Family history of breast cancer   . Family history of cervical cancer   . Family history of colon cancer   . Family history of throat cancer   . Heart murmur   . Hyperlipidemia   . Hypertension   . Long term (current) use of anticoagulants   . Other primary cardiomyopathies   . Personal history of colonic polyps   . Personal history of venous thrombosis and embolism     Patient Active Problem List   Diagnosis Date Noted  . Port-A-Cath in place 09/08/2019  . Family history of breast cancer   . Family history of throat cancer   . Family history of cervical cancer   . Malignant neoplasm of overlapping sites of left breast in female, estrogen receptor negative ([place]) 08/01/2019  . Family history of  colon cancer mom 11/05/2018  . Long term (current) use of anticoagulants 09/17/2017  . Encounter for therapeutic drug monitoring 11/21/2013  . Asthma with bronchitis 09/01/2013  . Chest pain 01/01/2013  . Aortic insufficiency 10/25/2011  . Chronic anticoagulation 09/29/2011  . Nocturnal dyspnea 09/29/2011  . DVT (deep venous thrombosis) ([place]) 11/17/2010  . VITAMIN D DEFICIENCY 08/06/2009  . DVT 01/26/2009  . KNEE PAIN, RIGHT 06/19/2008  . EDEMA 06/19/2008  . ANKLE PAIN, LEFT 02/13/2008  . HYPERKALEMIA 08/20/2007  . HYPOKALEMIA 08/20/2007  . HYPERLIPIDEMIA 07/18/2007  . Cardiomyopathy, hypertensive ([place]) 07/18/2007  . Essential hypertension 06/26/2007  . DISEASE, HYPERTENSIVE HEART NOS W/HF 06/26/2007  . Congestive heart failure ([place]) 06/26/2007  . Allergic rhinitis, cause unspecified 06/26/2007  . ASTHMA 06/26/2007  . SYMPTOM, SYNCOPE AND COLLAPSE 06/26/2007  . HEADACHE 06/26/2007  . HEART MURMUR, HX OF 06/26/2007  . DVT, HX OF 06/26/2007    Past Surgical History:  Procedure Laterality Date  . ABDOMINAL HYSTERECTOMY    . CARDIAC CATHETERIZATION    . COLON SURGERY     colonoscopy  . DILATION AND CURETTAGE OF UTERUS     several  . PORTACATH PLACEMENT Left 08/18/2019   Procedure: INSERTION PORT-A-CATH;  Surgeon: Stark Klein, MD;  Location: [facility];  Service: General;  Laterality: Left;  . Rt knee arthoscopic    . TEE WITHOUT CARDIOVERSION N/A  02/21/2013   Procedure: TRANSESOPHAGEAL ECHOCARDIOGRAM (TEE);  Surgeon: Lelon Perla, MD;  Location[facility] ENDOSCOPY;  Service: Cardiovascular;  Laterality: N/A;     OB History   No obstetric history on file.     Family History  Problem Relation Age of Onset  . Alcohol abuse Other   . Depression Other   . Hyperlipidemia Other   . Hypertension Other   . Kidney disease Other   . Colon cancer Mother        dx late 46s  . Cervical cancer Maternal Grandmother   . Stroke Paternal Grandmother   . Breast cancer Maternal Aunt         dx 68s  . Breast cancer Cousin        dx 69s    Social History   Tobacco Use  . Smoking status: Former Smoker    Packs/day: 0.25    Quit date: 07/02/2019    Years since quitting: 0.2  . Smokeless tobacco: Never Used  Substance Use Topics  . Alcohol use: Yes    Comment: rare occasion  . Drug use: No    Home Medications Prior to Admission medications   Medication Sig Start Date End Date Taking? Authorizing Provider  Albuterol Sulfate (PROAIR RESPICLICK) 010 (90 Base) MCG/ACT AEPB Inhale 2 puffs into the lungs every 6 (six) hours as needed. Patient taking differently: Inhale 2 puffs into the lungs every 6 (six) hours as needed (SOB / wheezing).  11/27/18  Yes Panosh, Standley Brooking, MD  amLODipine (NORVASC) 10 MG tablet TAKE 1 TABLET BY MOUTH  DAILY Patient taking differently: Take 10 mg by mouth daily.  08/11/19  Yes Lelon Perla, MD  atorvastatin (LIPITOR) 20 MG tablet TAKE 1 TABLET BY MOUTH  DAILY Patient taking differently: Take 20 mg by mouth daily at 6 PM.  05/22/19  Yes Crenshaw, Denice Bors, MD  Carboxymethylcellul-Glycerin (CLEAR EYES FOR DRY EYES) 1-0.25 % SOLN Place 1 drop into both eyes 2 (two) times daily as needed (dry eyes).   Yes [provider]  Cholecalciferol (VITAMIN D) 50 MCG (2000 UT) tablet Take 2,000 Units by mouth daily.   Yes [provider]  dexamethasone (DECADRON) 4 MG tablet Take 1 tablet (4 mg total) by mouth 2 (two) times daily. Take 1 tablet day before chemo and 1 tablet day after chemo with food 08/06/19  Yes Nicholas Lose, MD  fexofenadine (ALLEGRA) 180 MG tablet Take 180 mg by mouth daily as needed for allergies.    Yes [provider]  fluticasone (FLONASE) 50 MCG/ACT nasal spray USE 2 SPRAYS IN EACH NOSTRIL DAILY. PT NEEDS TO SCHEDULE A FOLLOW UP APPT BEFORE NEXT REFILL. Patient taking differently: Place 2 sprays into both nostrils daily.  09/20/11  Yes Panosh, Standley Brooking, MD  furosemide (LASIX) 40 MG tablet TAKE 1 TABLET BY MOUTH   DAILY Patient taking differently: Take 40 mg by mouth daily.  05/22/19  Yes Lelon Perla, MD  hydrALAZINE (APRESOLINE) 50 MG tablet TAKE 1 TABLET BY MOUTH 3  TIMES DAILY Patient taking differently: Take 50 mg by mouth 3 (three) times daily.  08/11/19  Yes Lelon Perla, MD  lidocaine-prilocaine (EMLA) cream Apply to affected area once 08/06/19  Yes Nicholas Lose, MD  LORazepam (ATIVAN) 0.5 MG tablet Take 1 tablet (0.5 mg total) by mouth at bedtime as needed for sleep. 08/06/19  Yes Nicholas Lose, MD  magic mouthwash w/lidocaine SOLN Take 5 mLs by mouth 3 (three) times daily as  needed for mouth pain. 09/08/19  Yes Nicholas Lose, MD  metoprolol tartrate (LOPRESSOR) 50 MG tablet TAKE 1 TABLET BY MOUTH  TWICE DAILY Patient taking differently: Take 50 mg by mouth 2 (two) times daily.  05/22/19  Yes Lelon Perla, MD  ondansetron (ZOFRAN) 8 MG tablet Take 1 tablet (8 mg total) by mouth 2 (two) times daily as needed (Nausea or vomiting). Begin 4 days after chemotherapy. 08/06/19  Yes Nicholas Lose, MD  potassium chloride SA (KLOR-CON) 20 MEQ tablet TAKE 1 TABLET BY MOUTH  DAILY Patient taking differently: Take 20 mEq by mouth daily.  08/11/19  Yes Lelon Perla, MD  prochlorperazine (COMPAZINE) 10 MG tablet Take 1 tablet (10 mg total) by mouth every 6 (six) hours as needed (Nausea or vomiting). 08/06/19  Yes Nicholas Lose, MD  ramipril (ALTACE) 10 MG capsule TAKE 1 CAPSULE BY MOUTH  DAILY Patient taking differently: Take 10 mg by mouth daily.  05/22/19  Yes Lelon Perla, MD  sodium chloride (OCEAN) 0.65 % SOLN nasal spray Place 1 spray into both nostrils as needed for congestion.   Yes [provider]  warfarin (COUMADIN) 5 MG tablet Take 1 tablet daily or as directed by anticoagulation clinic. Patient taking differently: Take 2.5-5 mg by mouth See admin instructions. Take 1 tablet daily or as directed by anticoagulation clinic; 5 mg all days except taking 2.5 mg on Wed 06/25/19  Yes  Panosh, Standley Brooking, MD  oxyCODONE (OXY IR/ROXICODONE) 5 MG immediate release tablet Take 1 tablet (5 mg total) by mouth every 6 (six) hours as needed for severe pain. Patient not taking: Reported on 09/21/2019 08/18/19   Stark Klein, MD    Allergies    Penicillins, Tetanus toxoid, and Aspirin  Review of Systems   Review of Systems  Constitutional: Positive for fatigue. Negative for fever.  Gastrointestinal: Positive for nausea and vomiting.  Genitourinary: Positive for decreased urine volume.  Neurological: Positive for weakness.  All other systems reviewed and are negative.   Physical Exam Updated Vital Signs BP (!) 145/99   Pulse (!) 108   Temp (!) 97.5 F (36.4 C) (Oral)   Resp 16   Ht 5\' 5"  (1.651 m)   Wt 87.1 kg   SpO2 100%   BMI 31.95 kg/m   Physical Exam Vitals and nursing note reviewed.  Constitutional:      Appearance: She is well-developed. She is ill-appearing.  HENT:     Head: Normocephalic and atraumatic.  Eyes:     Conjunctiva/sclera: Conjunctivae normal.  Cardiovascular:     Rate and Rhythm: Regular rhythm. Tachycardia present.  Pulmonary:     Effort: Pulmonary effort is normal. No respiratory distress.     Breath sounds: Normal breath sounds.  Abdominal:     General: Bowel sounds are normal.     Palpations: Abdomen is soft.     Tenderness: There is no abdominal tenderness.  Skin:    General: Skin is warm.  Neurological:     Mental Status: She is alert.  Psychiatric:        Behavior: Behavior normal.     ED Results / Procedures / Treatments   Labs (all labs ordered are listed, but only abnormal results are displayed) Labs Reviewed  COMPREHENSIVE METABOLIC PANEL - Abnormal; Notable for the following components:      Result Value   Sodium 157 (*)    Chloride 118 (*)    Glucose, Bld 107 (*)    BUN 57 (*)  Creatinine, Ser 3.13 (*)    Alkaline Phosphatase 154 (*)    GFR calc non Af Amer 15 (*)    GFR calc Af Amer 17 (*)    Anion gap 17  (*)    All other components within normal limits  CBC WITH DIFFERENTIAL/PLATELET - Abnormal; Notable for the following components:   WBC 24.8 (*)    MCHC 29.7 (*)    RDW 16.6 (*)    Platelets 93 (*)    Neutro Abs 20.5 (*)    Monocytes Absolute 1.1 (*)    Basophils Absolute 0.2 (*)    Abs Immature Granulocytes 0.90 (*)    All other components within normal limits  CULTURE, BLOOD (ROUTINE X 2)  CULTURE, BLOOD (ROUTINE X 2)  LACTIC ACID, PLASMA  URINALYSIS, ROUTINE W REFLEX MICROSCOPIC  POC SARS CORONAVIRUS 2 AG -  ED    EKG EKG Interpretation  Date/Time:  Sunday September 21 2019 14:21:38 EST Ventricular Rate:  80 PR Interval:    QRS Duration: 84 QT Interval:  515 QTC Calculation: 595 R Axis:   -33 Text Interpretation: Sinus rhythm Left ventricular hypertrophy Nonspecific T abnrm, anterolateral leads Prolonged QT interval No significant change since last tracing Confirmed by Isla Pence [phone]) on 09/21/2019 2:26:03 PM   Radiology No results found.  Procedures Procedures (including critical care time)  Medications Ordered in ED Medications  ondansetron (ZOFRAN) injection 4 mg (has no administration in time range)  sodium chloride 0.9 % bolus 1,000 mL (1,000 mLs Intravenous New Bag/Given 09/21/19 1342)  ondansetron (ZOFRAN) injection 4 mg (4 mg Intravenous Given 09/21/19 1343)  alteplase (CATHFLO ACTIVASE) injection 2 mg (2 mg Intracatheter Given 09/21/19 1404)    ED Course  I have reviewed the triage vital signs and the nursing notes.  Pertinent labs & imaging results that were available during my care of the patient were reviewed by me and considered in my medical decision making (see chart for details).    MDM Rules/Calculators/A&P                      Patient is a 68 year old female undergoing chemotherapy treatment for breast cancer, last treatment on 09/10/2019, next planned treatment on 09/30/2019.  She is presenting with nausea and vomiting that began  about 4 days after her last treatment with progressive worsening generalized weakness and fatigue.  She is unable to keep any fluids down.  She denies associated fever, abdominal pain, changes in bowel habits.  On exam she is ill-appearing, abdomen is nontender.  She is tachycardic with stable blood pressure.  Afebrile.  IV fluids and antiemetics initiated.  Labs obtained which reveal significant hypernatremia and AKI.  Creatinine up to 3 from 1.82 weeks ago.  There is also significant leukocytosis present of 25.  Of note, patient was given Neulasta at her last chemotherapy treatment.  Lactic is within normal limits.  Blood culture sent.  Urine pending.  Patient significant AKI with hypernatremia and ill appearance, will admit for IV hydration and further management.  Patient is agreeable to plan.  Patient discussed with and evaluated by Dr. Gilford Raid, who agrees with work-up and care plan for admission.  Final Clinical Impression(s) / ED Diagnoses Final diagnoses:  Dehydration  AKI (acute kidney injury) ([place])  Non-intractable vomiting with nausea, unspecified vomiting type  Hypernatremia    Rx / DC Orders ED Discharge Orders    None       Nikolaj Geraghty, [place] N, PA-C 09/21/19 1457  Isla Pence, MD 09/21/19 [phone]

## 2019-09-21 NOTE — ED Notes (Signed)
Pt has poor vascular access.  Unable to obtain second set of blood cultures.

## 2019-09-21 NOTE — ED Notes (Signed)
Pt sister called for an update. Sister requesting to be informed when pt has a room.

## 2019-09-21 NOTE — ED Notes (Signed)
Please call Elliot Gault (sister) at [phone] with updates

## 2019-09-21 NOTE — ED Triage Notes (Signed)
Pt BIB EMS and presents from home.  Pt reports generalized weakness, n/v for the past week.  Pt is a breat CA pt.  Pt's last chemo tx was two weeks ago.  Pt next chemo tx is scheduled for 09/30/19. Pt a/o x 4 and is able to bear weight on legs. Pt denies any pain to EMS.   160/100, HR 110, 100% RA, CBG:117, Temp 98.2

## 2019-09-21 NOTE — ED Notes (Signed)
Pt Son called regarding pt plan.This Probation officer explain pt is being admitted and will go to the floor. He is concerned about pt ability to care for herself at home when she is discharged.

## 2019-09-21 NOTE — H&P (Signed)
History and Physical    [name] Foster IRS:[id number] DOB: November 28, 1950 DOA: 09/21/2019  PCP: Burnis Medin, MD   Patient coming from: Home  Chief Complaint: Nausea and Vomiting; generalized weakness and fatigue  HPI: [name] Foster is a 68 y.o. female with a PMH significant for but not limited too allergic rhinitis and asthma, history of CHF and coronary artery disease, hypertension, hyperlipidemia, myopathy, history of DVT on Coumadin, history of left-sided breast cancer currently on chemotherapy seeing Dr. Nicholas Lose, as well as other comorbidities who presents with a chief complaint of nausea vomiting as well as generalized weakness and fatigue.  Of note patient recently had chemotherapy on 09/08/2019 and had Neulasta on 09/09/2008.  Patient states that about 4 days after her chemotherapy she started feeling sick and had nausea and vomiting and has had nausea vomiting for last 10 days without relief of her antiemetics that she was given at home.  Denies any abdominal pain or cramping and states that she has not vomited today but was having some dry heaves.  Felt significantly weaker and states that she has not had her medication since Thursday because she kept throwing them up.  Denies any chest pain or shortness of breath.  Denies any burning or discomfort in her urine and denies any other complaints or concerns at this time.  [facility] was asked to admit this patient for intractable nausea and vomiting and dehydration with significant AKI found on blood work.  ED Course: In the ED patient had basic blood work done and was found to be hypernatremic and hyperchloremic along with a significant AKI.  She is also found to have a leukocytosis and thrombocytopenia and a lactic acid level done and normal.  Of note SARS-CoV-2 point-of-care testing was negative but 64-hour PCR turnaround time is still pending and she will be kept as a patient under investigation until the results given her  symptomatology.  Review of Systems: As per HPI otherwise all other systems reviewed and negative.   Past Medical History:  Diagnosis Date  . Allergic rhinitis   . Aortic valve disorders   . Arthritis   . Asthma   . CHF (congestive heart failure) ([place])   . Coronary artery disease   . Family history of adverse reaction to anesthesia    difficulty waking mother up after surgery  . Family history of breast cancer   . Family history of cervical cancer   . Family history of colon cancer   . Family history of throat cancer   . Heart murmur   . Hyperlipidemia   . Hypertension   . Long term (current) use of anticoagulants   . Other primary cardiomyopathies   . Personal history of colonic polyps   . Personal history of venous thrombosis and embolism    Past Surgical History:  Procedure Laterality Date  . ABDOMINAL HYSTERECTOMY    . CARDIAC CATHETERIZATION    . COLON SURGERY     colonoscopy  . DILATION AND CURETTAGE OF UTERUS     several  . PORTACATH PLACEMENT Left 08/18/2019   Procedure: INSERTION PORT-A-CATH;  Surgeon: Stark Klein, MD;  Location: [facility];  Service: General;  Laterality: Left;  . Rt knee arthoscopic    . TEE WITHOUT CARDIOVERSION N/A 02/21/2013   Procedure: TRANSESOPHAGEAL ECHOCARDIOGRAM (TEE);  Surgeon: Lelon Perla, MD;  Location[facility] ENDOSCOPY;  Service: Cardiovascular;  Laterality: N/A;   SOCIAL HISTORY   reports that she quit smoking about 2 months ago. She smoked 0.25  packs per day. She has never used smokeless tobacco. She reports current alcohol use. She reports that she does not use drugs.  Allergies  Allergen Reactions  . Penicillins Rash    Did it involve swelling of the face/tongue/throat, SOB, or low BP? Unknown Did it involve sudden or severe rash/hives, skin peeling, or any reaction on the inside of your mouth or nose? Yes Did you need to seek medical attention at a hospital or doctor's office? Yes When did it last happen?in her 25s If  all above answers are "NO", may proceed with cephalosporin use.   . Tetanus Toxoid Rash    Caused cellulitis   . Aspirin Rash   Family History  Problem Relation Age of Onset  . Alcohol abuse Other   . Depression Other   . Hyperlipidemia Other   . Hypertension Other   . Kidney disease Other   . Colon cancer Mother        dx late 5s  . Cervical cancer Maternal Grandmother   . Stroke Paternal Grandmother   . Breast cancer Maternal Aunt        dx 22s  . Breast cancer Cousin        dx 45s   Prior to Admission medications   Medication Sig Start Date End Date Taking? Authorizing Provider  Albuterol Sulfate (PROAIR RESPICLICK) 371 (90 Base) MCG/ACT AEPB Inhale 2 puffs into the lungs every 6 (six) hours as needed. Patient taking differently: Inhale 2 puffs into the lungs every 6 (six) hours as needed (SOB / wheezing).  11/27/18  Yes Panosh, Standley Brooking, MD  amLODipine (NORVASC) 10 MG tablet TAKE 1 TABLET BY MOUTH  DAILY Patient taking differently: Take 10 mg by mouth daily.  08/11/19  Yes Lelon Perla, MD  atorvastatin (LIPITOR) 20 MG tablet TAKE 1 TABLET BY MOUTH  DAILY Patient taking differently: Take 20 mg by mouth daily at 6 PM.  05/22/19  Yes Crenshaw, Denice Bors, MD  Carboxymethylcellul-Glycerin (CLEAR EYES FOR DRY EYES) 1-0.25 % SOLN Place 1 drop into both eyes 2 (two) times daily as needed (dry eyes).   Yes [provider]  Cholecalciferol (VITAMIN D) 50 MCG (2000 UT) tablet Take 2,000 Units by mouth daily.   Yes [provider]  dexamethasone (DECADRON) 4 MG tablet Take 1 tablet (4 mg total) by mouth 2 (two) times daily. Take 1 tablet day before chemo and 1 tablet day after chemo with food 08/06/19  Yes Nicholas Lose, MD  fexofenadine (ALLEGRA) 180 MG tablet Take 180 mg by mouth daily as needed for allergies.    Yes [provider]  fluticasone (FLONASE) 50 MCG/ACT nasal spray USE 2 SPRAYS IN EACH NOSTRIL DAILY. PT NEEDS TO SCHEDULE A FOLLOW UP APPT BEFORE NEXT  REFILL. Patient taking differently: Place 2 sprays into both nostrils daily.  09/20/11  Yes Panosh, Standley Brooking, MD  furosemide (LASIX) 40 MG tablet TAKE 1 TABLET BY MOUTH  DAILY Patient taking differently: Take 40 mg by mouth daily.  05/22/19  Yes Lelon Perla, MD  hydrALAZINE (APRESOLINE) 50 MG tablet TAKE 1 TABLET BY MOUTH 3  TIMES DAILY Patient taking differently: Take 50 mg by mouth 3 (three) times daily.  08/11/19  Yes Lelon Perla, MD  lidocaine-prilocaine (EMLA) cream Apply to affected area once 08/06/19  Yes Nicholas Lose, MD  LORazepam (ATIVAN) 0.5 MG tablet Take 1 tablet (0.5 mg total) by mouth at bedtime as needed for sleep. 08/06/19  Yes Nicholas Lose, MD  magic mouthwash w/lidocaine SOLN Take 5 mLs by mouth 3 (three) times daily as needed for mouth pain. 09/08/19  Yes Nicholas Lose, MD  metoprolol tartrate (LOPRESSOR) 50 MG tablet TAKE 1 TABLET BY MOUTH  TWICE DAILY Patient taking differently: Take 50 mg by mouth 2 (two) times daily.  05/22/19  Yes Lelon Perla, MD  ondansetron (ZOFRAN) 8 MG tablet Take 1 tablet (8 mg total) by mouth 2 (two) times daily as needed (Nausea or vomiting). Begin 4 days after chemotherapy. 08/06/19  Yes Nicholas Lose, MD  potassium chloride SA (KLOR-CON) 20 MEQ tablet TAKE 1 TABLET BY MOUTH  DAILY Patient taking differently: Take 20 mEq by mouth daily.  08/11/19  Yes Lelon Perla, MD  prochlorperazine (COMPAZINE) 10 MG tablet Take 1 tablet (10 mg total) by mouth every 6 (six) hours as needed (Nausea or vomiting). 08/06/19  Yes Nicholas Lose, MD  ramipril (ALTACE) 10 MG capsule TAKE 1 CAPSULE BY MOUTH  DAILY Patient taking differently: Take 10 mg by mouth daily.  05/22/19  Yes Lelon Perla, MD  sodium chloride (OCEAN) 0.65 % SOLN nasal spray Place 1 spray into both nostrils as needed for congestion.   Yes [provider]  warfarin (COUMADIN) 5 MG tablet Take 1 tablet daily or as directed by anticoagulation clinic. Patient taking  differently: Take 2.5-5 mg by mouth See admin instructions. Take 1 tablet daily or as directed by anticoagulation clinic; 5 mg all days except taking 2.5 mg on Wed 06/25/19  Yes Panosh, Standley Brooking, MD  oxyCODONE (OXY IR/ROXICODONE) 5 MG immediate release tablet Take 1 tablet (5 mg total) by mouth every 6 (six) hours as needed for severe pain. Patient not taking: Reported on 09/21/2019 08/18/19   Stark Klein, MD   Physical Exam: Vitals:   09/21/19 1220 09/21/19 1330  BP: (!) 145/99   Pulse: (!) 117 (!) 108  Resp: 18 16  Temp: (!) 97.5 F (36.4 C)   TempSrc: Oral   SpO2: 99% 100%  Weight: 87.1 kg   Height: 5\' 5"  (1.651 m)    Constitutional: WN/WD overweight African-American female currently ill-appearing and appears a little uncomfortable and fatigued Eyes: Lids and conjunctivae normal, sclerae anicteric  ENMT: External Ears, Nose appear normal. Grossly normal hearing. Mucous membranes are dry Neck: Appears normal, supple, no cervical masses, normal ROM, no appreciable thyromegaly; no JVD Respiratory: Diminished to auscultation bilaterally, no wheezing, rales, rhonchi or crackles. Normal respiratory effort and patient is not tachypenic. No accessory muscle use.  Cardiovascular: RRR, no murmurs / rubs / gallops. S1 and S2 auscultated. No extremity edema.  Abdomen: Soft, non-tender, Distended due to body habitus. Bowel sounds positive x4.  GU: Deferred. Musculoskeletal: No clubbing / cyanosis of digits/nails. No joint deformity upper and lower extremities but has a port.  Skin: No rashes, lesions, ulcers on a limited skin evaluation. No induration; Warm and dry.  Neurologic: CN 2-12 grossly intact with no focal deficits. Romberg sign cerebellar reflexes not assessed.  Psychiatric: Normal judgment and insight. Alert and oriented x 3. Normal mood and flat affect.   Labs on Admission: I have personally reviewed following labs and imaging studies  CBC: Recent Labs  Lab 09/21/19 1336  WBC  24.8*  NEUTROABS 20.5*  HGB 12.4  HCT 41.7  MCV 98.6  PLT 93*   Basic Metabolic Panel: Recent Labs  Lab 09/21/19 1336  NA 157*  K 3.9  CL 118*  CO2 22  GLUCOSE 107*  BUN 57*  CREATININE  3.13*  CALCIUM 9.1   GFR: Estimated Creatinine Clearance: 19 mL/min (A) (by C-G formula based on SCr of 3.13 mg/dL (H)). Liver Function Tests: Recent Labs  Lab 09/21/19 1336  AST 36  ALT 23  ALKPHOS 154*  BILITOT 0.7  PROT 6.6  ALBUMIN 3.6   No results for input(s): LIPASE, AMYLASE in the last 168 hours. No results for input(s): AMMONIA in the last 168 hours. Coagulation Profile: No results for input(s): INR, PROTIME in the last 168 hours. Cardiac Enzymes: No results for input(s): CKTOTAL, CKMB, CKMBINDEX, TROPONINI in the last 168 hours. BNP (last 3 results) No results for input(s): PROBNP in the last 8760 hours. HbA1C: No results for input(s): HGBA1C in the last 72 hours. CBG: No results for input(s): GLUCAP in the last 168 hours. Lipid Profile: No results for input(s): CHOL, HDL, LDLCALC, TRIG, CHOLHDL, LDLDIRECT in the last 72 hours. Thyroid Function Tests: No results for input(s): TSH, T4TOTAL, FREET4, T3FREE, THYROIDAB in the last 72 hours. Anemia Panel: No results for input(s): VITAMINB12, FOLATE, FERRITIN, TIBC, IRON, RETICCTPCT in the last 72 hours. Urine analysis:    Component Value Date/Time   COLORURINE yellow 09/24/2007 1408   APPEARANCEUR Clear 09/24/2007 1408   HGBUR negative 09/24/2007 1408   BILIRUBINUR negative 09/24/2007 1408   UROBILINOGEN negative 09/24/2007 1408   NITRITE negative 09/24/2007 1408   Sepsis Labs: !!!!!!!!!!!!!!!!!!!!!!!!!!!!!!!!!!!!!!!!!!!! @LABRCNTIP (procalcitonin:4,lacticidven:4) )No results found for this or any previous visit (from the past 240 hour(s)).   Radiological Exams on Admission: CT ABDOMEN PELVIS WO CONTRAST  Result Date: 09/21/2019 CLINICAL DATA:  Weakness and nausea and vomiting for 1 week. Undergoing chemotherapy  for breast carcinoma. EXAM: CT ABDOMEN AND PELVIS WITHOUT CONTRAST TECHNIQUE: Multidetector CT imaging of the abdomen and pelvis was performed following the standard protocol without IV contrast. COMPARISON:  08/12/2019 FINDINGS: Lower chest: No acute findings. Hepatobiliary: No mass visualized on this unenhanced exam. Focal fatty infiltration again noted adjacent to the falciform ligament. Gallbladder is unremarkable. No evidence of biliary ductal dilatation. Pancreas: No mass or inflammatory process visualized on this unenhanced exam. Spleen:  Within normal limits in size. Adrenals/Urinary tract: No evidence of urolithiasis or hydronephrosis. A tiny subcapsular lesion is again seen in the posterior upper pole the left kidney which demonstrate contrast enhancement on prior exam, suspicious for small renal cell carcinoma. Unremarkable unopacified urinary bladder. Stomach/Bowel: No evidence of obstruction, inflammatory process, or abnormal fluid collections. Normal appendix visualized. Vascular/Lymphatic: No pathologically enlarged lymph nodes identified. No evidence of abdominal aortic aneurysm. Aortic atherosclerosis incidentally noted. Reproductive: Prior hysterectomy noted. Adnexal regions are unremarkable in appearance. Other:  None. Musculoskeletal: No suspicious bone lesions identified. Stable mild compression fracture deformity of the L2 vertebral body. IMPRESSION: No acute findings. Tiny subcapsular lesion in posterior upper pole of left kidney which showed contrast enhancement on prior exam, suspicious for renal cell carcinoma. Electronically Signed   By: Marlaine Hind M.D.   On: 09/21/2019 16:48   DG CHEST PORT 1 VIEW  Result Date: 09/21/2019 CLINICAL DATA:  Generalized weakness EXAM: PORTABLE CHEST 1 VIEW COMPARISON:  August 18, 2019 FINDINGS: The heart size and mediastinal contours are within normal limits. A left-sided MediPort catheter seen with the tip at the superior SVC. Aortic knob  calcifications. Both lungs are clear. The visualized skeletal structures are unremarkable. IMPRESSION: No active disease. Electronically Signed   By: Prudencio Pair M.D.   On: 09/21/2019 16:10    EKG: Independently reviewed.  Sinus rhythm with a rate of 80 and LVH  and a prolonged QTc of 595.  No evidence of any ST elevation on my interpretation  Assessment/Plan Active Problems:   HLD (hyperlipidemia)   Essential hypertension   Hypertensive heart disease with heart failure (HCC)   Congestive heart failure (HCC)   DVT, HX OF   Aortic insufficiency   Asthma with bronchitis   Long term (current) use of anticoagulants   Malignant neoplasm of overlapping sites of left breast in female, estrogen receptor negative ([place])   Port-A-Cath in place   Intractable nausea and vomiting   Generalized weakness   Acute on chronic kidney failure (HCC)   Leukocytosis   Hypernatremia   Hyperchloremia   Thrombocytopenia (HCC)  Intractable Nausea/Vomiting with Unclear Etiology  -Recently had Chemotherapy on 09/08/2019 and started having Nausea and Vomiting 4 days after Treatment and Had Neulasta on 12/9 -? Related to Chemotherapy  -Check CT Abdomen and Pelvis w/o Contrast -Place on CLD -C/w Supportive Care and Antiemetics but will need judicious usage given prolonged QTC.  Will start Compazine instead of ondansetron -Follow up on CT Scan Results -C/w IVF Hydration with D5 1/2 NS at 75 mL/hr   Generalized Weakness in the Setting of Above -C/w Supportive Treatment and IVF with D5 1/2 NS at 75 mL/hr  -Get PT/OT to evaluate and Treat  AKI on CKD Stage 3 -In the Setting of Nausea and Vomiting  -Patient's BUN/Cr went from 22/1.88 -> 57/3.13 -Avoid Nephrotoxic Medications, Contrast Dyes, Hypotension if possible and Renally Dose Medications -Repeat CMP in AM   Hypernatremia/Hyperchloremia  -In the setting of Dehydration -Given 2 Liters of NS in the ED -C/w D5 1/2 NS at 75 mL/hr x1 day -Continue to Monitor  and Trend -Repeat CMP in AM   Leukocytosis -Patient's WBC had gone from 9.4 -> 24.8  -In the Setting of Dehydration from Nausea and Vomiting but Did have Neulasta Recently as well as Dexamethasone post Chemotherapy  -Pan-Cx Patient and Check Blood Cx x2, Urinalysis, and Urine Cx -CXR on Admission not Done so will order one now and will also order CT Abd/Pelvis w/o Contrast due to Nausea and Vomiting  -Of Note SARS-CoV-2 Testing POC Negative but I requested the 6-24 Hour TAT to be ordered as well -Hold of Abx at this point until Cx's are obtained; Currently Afebrile and LA was 1.8 -Continue to Monitor and Trend WBC -Repeat CBC in AM   Thrombocytopenia -Patient's Platelet Count went from 169 on 12/7 -> 93 today -Continue to Monitor for S/Sx of Bleeding as she is Anticoagulated on Coumadin; Currently no overt bleeding noted -Repeat CBC in AM   Left Breast Cancer/Malignant Neoplasm overlapping sites of the Left Breast -Sees Dr. Lindi Adie in the outpatient Setting  -Will notify him via Epic as a Courtesy   HTN/Hypertensive Heart Disease  -C/w Amlodipine 10 mg po Daily in AM and Hydralazine -C/w Metoprolol Tartrate 50 mg po BID  -Hold Ramipril and Furosemide for now given AKI   Chronic Diastolic CHF Cardiomyopathy and Hx of Aortic Insufficieny  -Hold ACE and Furosemide  -Strict I's and O's and Daily Weights -Continue Coumadin per Pharmacy -Continue to Monitor for S/Sx of Volume overload   HLD -C/w Atorvastain 20 mg po daily if able to take po   Hx of DVT -C/w Coumadin which [place] to dose  CAD -C/w Atorvastatin 20 mg po Daily -C/w Telemetry -Currently not complaining of any CP  Hx of Asthma with Bronchitis -C/w Albuterol Inhaler 2 puff IH q6hprn Wheezing and SOB  Prolonged QT -  QTc was 595 -C/w IVF Hydration -C/w Telemetry Monitoring -Repeat EKG in AM -Check Mag and Phos  -Avoid QT prolonging agents if possible; she was given 2 doses of ondansetron in the ED will change  this to Compazine  DVT prophylaxis: Anticoagulated with Coumadin  Code Status: FULL CODE Family Communication: No family present at bedside  Disposition Plan: Pending Clinical improvement back to baseline and tolerance of diet  Consults called: Will notify Dr. Lindi Adie via Epic  Admission status: Inpatient Telemetry   Severity of Illness: The appropriate patient status for this patient is INPATIENT. Inpatient status is judged to be reasonable and necessary in order to provide the required intensity of service to ensure the patient's safety. The patient's presenting symptoms, physical exam findings, and initial radiographic and laboratory data in the context of their chronic comorbidities is felt to place them at high risk for further clinical deterioration. Furthermore, it is not anticipated that the patient will be medically stable for discharge from the hospital within 2 midnights of admission. The following factors support the patient status of inpatient.   " The patient's presenting symptoms include Nausea and Vomiting for 10 days. " The worrisome physical exam findings include Dry MM. " The initial radiographic and laboratory data are worrisome because of AKI, HyperNatremia. " The chronic co-morbidities are listed as above  * I certify that at the point of admission it is my clinical judgment that the patient will require inpatient hospital care spanning beyond 2 midnights from the point of admission due to high intensity of service, high risk for further deterioration and high frequency of surveillance required.Kerney Elbe, D.O. [facility] PAGER is on [place]  If 7PM-7AM, please contact night-coverage [email or web address]  09/21/2019, 5:13 PM

## 2019-09-22 LAB — COMPREHENSIVE METABOLIC PANEL
ALT: 20 U/L (ref 0–44)
AST: 30 U/L (ref 15–41)
Albumin: 3.2 g/dL — ABNORMAL LOW (ref 3.5–5.0)
Alkaline Phosphatase: 148 U/L — ABNORMAL HIGH (ref 38–126)
Anion gap: 10 (ref 5–15)
BUN: 46 mg/dL — ABNORMAL HIGH (ref 8–23)
CO2: 23 mmol/L (ref 22–32)
Calcium: 8.5 mg/dL — ABNORMAL LOW (ref 8.9–10.3)
Chloride: 123 mmol/L — ABNORMAL HIGH (ref 98–111)
Creatinine, Ser: 2.57 mg/dL — ABNORMAL HIGH (ref 0.44–1.00)
GFR calc Af Amer: 22 mL/min — ABNORMAL LOW (ref >60)
GFR calc non Af Amer: 19 mL/min — ABNORMAL LOW (ref >60)
Glucose, Bld: 182 mg/dL — ABNORMAL HIGH (ref 70–99)
Potassium: 3.3 mmol/L — ABNORMAL LOW (ref 3.5–5.1)
Sodium: 156 mmol/L — ABNORMAL HIGH (ref 135–145)
Total Bilirubin: 0.5 mg/dL (ref 0.3–1.2)
Total Protein: 5.7 g/dL — ABNORMAL LOW (ref 6.5–8.1)

## 2019-09-22 LAB — CBC WITH DIFFERENTIAL/PLATELET
Abs Immature Granulocytes: 0.69 10*3/uL — ABNORMAL HIGH (ref 0.00–0.07)
Basophils Absolute: 0.2 10*3/uL — ABNORMAL HIGH (ref 0.0–0.1)
Basophils Relative: 1 %
Eosinophils Absolute: 0 10*3/uL (ref 0.0–0.5)
Eosinophils Relative: 0 %
HCT: 35.5 % — ABNORMAL LOW (ref 36.0–46.0)
Hemoglobin: 10.4 g/dL — ABNORMAL LOW (ref 12.0–15.0)
Immature Granulocytes: 3 %
Lymphocytes Relative: 13 %
Lymphs Abs: 2.8 10*3/uL (ref 0.7–4.0)
MCH: 28.7 pg (ref 26.0–34.0)
MCHC: 29.3 g/dL — ABNORMAL LOW (ref 30.0–36.0)
MCV: 98.1 fL (ref 80.0–100.0)
Monocytes Absolute: 0.8 10*3/uL (ref 0.1–1.0)
Monocytes Relative: 3 %
Neutro Abs: 18.1 10*3/uL — ABNORMAL HIGH (ref 1.7–7.7)
Neutrophils Relative %: 80 %
Platelets: 70 10*3/uL — ABNORMAL LOW (ref 150–400)
RBC: 3.62 MIL/uL — ABNORMAL LOW (ref 3.87–5.11)
RDW: 16.5 % — ABNORMAL HIGH (ref 11.5–15.5)
WBC: 22.6 10*3/uL — ABNORMAL HIGH (ref 4.0–10.5)
nRBC: 0.3 % — ABNORMAL HIGH (ref 0.0–0.2)

## 2019-09-22 LAB — HIV ANTIBODY (ROUTINE TESTING W REFLEX): HIV Screen 4th Generation wRfx: NONREACTIVE

## 2019-09-22 LAB — PROTIME-INR
INR: 1.2 (ref 0.8–1.2)
Prothrombin Time: 15.5 seconds — ABNORMAL HIGH (ref 11.4–15.2)

## 2019-09-22 LAB — MAGNESIUM: Magnesium: 2.4 mg/dL (ref 1.7–2.4)

## 2019-09-22 LAB — GLUCOSE, CAPILLARY: Glucose-Capillary: 136 mg/dL — ABNORMAL HIGH (ref 70–99)

## 2019-09-22 LAB — HEMOGLOBIN A1C
Hgb A1c MFr Bld: 6.5 % — ABNORMAL HIGH (ref 4.8–5.6)
Mean Plasma Glucose: 139.85 mg/dL

## 2019-09-22 LAB — TSH: TSH: 3.058 u[IU]/mL (ref 0.350–4.500)

## 2019-09-22 LAB — PHOSPHORUS: Phosphorus: 2.8 mg/dL (ref 2.5–4.6)

## 2019-09-22 MED ORDER — BOOST / RESOURCE BREEZE PO LIQD CUSTOM
1.0000 | Freq: Three times a day (TID) | ORAL | Status: DC
  Administered 2019-09-24 – 2019-09-27 (×6): 1 via ORAL

## 2019-09-22 MED ORDER — POTASSIUM CHLORIDE CRYS ER 20 MEQ PO TBCR
40.0000 meq | EXTENDED_RELEASE_TABLET | Freq: Once | ORAL | Status: AC
  Administered 2019-09-22: 40 meq via ORAL
  Filled 2019-09-22: qty 2

## 2019-09-22 NOTE — Progress Notes (Signed)
After patient used [employer] cell phone, phone charger, glasses, room phone and call light placed within reach.

## 2019-09-22 NOTE — Progress Notes (Signed)
PROGRESS NOTE    [name] Foster  YQI:[id number] DOB: Oct 12, 1950 DOA: 09/21/2019 PCP: Burnis Medin, MD    Brief Narrative:  68 y.o. female with a PMH significant for but not limited too allergic rhinitis and asthma, history of CHF and coronary artery disease, hypertension, hyperlipidemia, myopathy, history of DVT on Coumadin, history of left-sided breast cancer currently on chemotherapy seeing Dr. Nicholas Lose, as well as other comorbidities who presents with a chief complaint of nausea vomiting as well as generalized weakness and fatigue.  Of note patient recently had chemotherapy on 09/08/2019 and had Neulasta on 09/09/2008.  Patient states that about 4 days after her chemotherapy she started feeling sick and had nausea and vomiting and has had nausea vomiting for last 10 days without relief of her antiemetics that she was given at home.  Denies any abdominal pain or cramping and states that she has not vomited today but was having some dry heaves.  Felt significantly weaker and states that she has not had her medication since Thursday because she kept throwing them up.  Denies any chest pain or shortness of breath.  Denies any burning or discomfort in her urine and denies any other complaints or concerns at this time.  TRH was asked to admit this patient for intractable nausea and vomiting and dehydration with significant AKI found on blood work.  ED Course: In the ED patient had basic blood work done and was found to be hypernatremic and hyperchloremic along with a significant AKI.  She is also found to have a leukocytosis and thrombocytopenia and a lactic acid level done and normal.  Of note SARS-CoV-2 point-of-care testing was negative, PCR neg  Assessment & Plan:   Active Problems:   HLD (hyperlipidemia)   Essential hypertension   Hypertensive heart disease with heart failure (HCC)   Congestive heart failure (HCC)   DVT, HX OF   Aortic insufficiency   Asthma with bronchitis   Long term  (current) use of anticoagulants   Malignant neoplasm of overlapping sites of left breast in female, estrogen receptor negative ([place])   Port-A-Cath in place   Intractable nausea and vomiting   Generalized weakness   Acute on chronic kidney failure (HCC)   Leukocytosis   Hypernatremia   Hyperchloremia   Thrombocytopenia (HCC)   Intractable Nausea/Vomiting with Unclear Etiology  -Recently had Chemotherapy on 09/08/2019 and started having Nausea and Vomiting 4 days after Treatment and Had Neulasta on 12/9 -Suspect symptoms related to recent chemo. Of note, pt reports similar symptoms after receiving previous chemo -CT abd reviewed, unremarkable -Continue with anti-emetics as needed -Have alerted Oncology of patient's admit -Continue on hydration as needed  Generalized Weakness in the Setting of Above -C/w Supportive Treatment and IVF with D5 1/2 NS at 75 mL/hr  -Will consult PT/OT  AKI on CKD Stage 3 -Likely secondary to presenting intractable Nausea and Vomiting  -Patient's BUN/Cr went from 22/1.88 -> 57/3.13 on presentation -Continued on IVF hydration -Repeat Cr now 2.57 -Recheck BMET in AM  Hypernatremia/Hyperchloremia  -Likely secondary to presenting dehydration slightly improved with IVF hydration -Repeat bmet in AM  Leukocytosis -Patient's WBC had gone from 9.4 -> 24.8  -In the Setting of Dehydration from Nausea and Vomiting but Did have Neulasta Recently as well as Dexamethasone post Chemotherapy  -Blood cx neg thus far -Urinalysis ordered at time of presentation, pending -COVID neg -Will continue to hold abx -recheck CBC in AM  Thrombocytopenia -Patient's Platelet Count went from 169 on 12/7 ->  93 today -Continue to Monitor for S/Sx of Bleeding as she is Anticoagulated on Coumadin; Currently no overt bleeding noted -Repeat CBC in AM   Left Breast Cancer/Malignant Neoplasm overlapping sites of the Left Breast -Sees Dr. Lindi Adie in the outpatient Setting    -Have alerted Oncology of admit  HTN/Hypertensive Heart Disease  -C/w Amlodipine 10 mg po Daily in AM and Hydralazine -C/w Metoprolol Tartrate 50 mg po BID  -Continuing to hold Ramipril and Furosemide for now given AKI   Chronic Diastolic CHF Cardiomyopathy and Hx of Aortic Insufficieny  -Hold ACE and Furosemide  -Strict I's and O's and Daily Weights -Continue Coumadin per Pharmacy -Continue to Monitor for S/Sx of Volume overload   HLD -C/w Atorvastain 20 mg po daily if able to take po   Hx of DVT -C/w Coumadin which [place] to dose  CAD -C/w Atorvastatin 20 mg po Daily -C/w Telemetry -Currently not complaining of any CP  Hx of Asthma with Bronchitis -C/w Albuterol Inhaler 2 puff IH q6hprn Wheezing and SOB -Stable at present  Prolonged QT -QTc was 595 -C/w IVF Hydration -Repeat EKG reviewed this AM, QTc in the 460 range, improved  DVT prophylaxis: SCD's Code Status: Full Family Communication: Pt in room, family not at bedside Disposition Plan: Uncertain at this time  Consultants:     Procedures:     Antimicrobials: Anti-infectives (From admission, onward)   None       Subjective: Still complaining of nausea this AM  Objective: Vitals:   09/21/19 2300 09/21/19 2344 09/22/19 0556 09/22/19 1405  BP: 121/83 (!) 141/83 (!) 145/90 136/81  Pulse: (!) 102 91 (!) 103 83  Resp: 16 16 16 20   Temp:  98.1 F (36.7 C) 97.8 F (36.6 C) 98 F (36.7 C)  TempSrc:  Oral Oral Oral  SpO2: 99% 99% 99% 97%  Weight:   77.7 kg   Height:        Intake/Output Summary (Last 24 hours) at 09/22/2019 1636 Last data filed at 09/22/2019 1510 Gross per 24 hour  Intake 807.58 ml  Output 200 ml  Net 607.58 ml   Filed Weights   09/21/19 1220 09/22/19 0556  Weight: 87.1 kg 77.7 kg    Examination:  General exam: Appears calm and comfortable  Respiratory system: Clear to auscultation. Respiratory effort normal. Cardiovascular system: S1 & S2 heard,  Regular Gastrointestinal system: Abdomen is nondistended, soft and nontender. Pos BS. Central nervous system: Alert and oriented. No focal neurological deficits. Extremities: Symmetric 5 x 5 power. Skin: No rashes, lesions Psychiatry: Judgement and insight appear normal. Mood & affect appropriate.   Data Reviewed: I have personally reviewed following labs and imaging studies  CBC: Recent Labs  Lab 09/21/19 1336 09/22/19 0433  WBC 24.8* 22.6*  NEUTROABS 20.5* 18.1*  HGB 12.4 10.4*  HCT 41.7 35.5*  MCV 98.6 98.1  PLT 93* 70*   Basic Metabolic Panel: Recent Labs  Lab 09/21/19 1336 09/21/19 1752 09/22/19 0433  NA 157*  --  156*  K 3.9  --  3.3*  CL 118*  --  123*  CO2 22  --  23  GLUCOSE 107*  --  182*  BUN 57*  --  46*  CREATININE 3.13*  --  2.57*  CALCIUM 9.1  --  8.5*  MG  --  2.3 2.4  PHOS  --  3.4 2.8   GFR: Estimated Creatinine Clearance: 21.9 mL/min (A) (by C-G formula based on SCr of 2.57 mg/dL (H)). Liver Function Tests:  Recent Labs  Lab 09/21/19 1336 09/22/19 0433  AST 36 30  ALT 23 20  ALKPHOS 154* 148*  BILITOT 0.7 0.5  PROT 6.6 5.7*  ALBUMIN 3.6 3.2*   No results for input(s): LIPASE, AMYLASE in the last 168 hours. No results for input(s): AMMONIA in the last 168 hours. Coagulation Profile: Recent Labs  Lab 09/21/19 1801 09/22/19 0433  INR 1.1 1.2   Cardiac Enzymes: No results for input(s): CKTOTAL, CKMB, CKMBINDEX, TROPONINI in the last 168 hours. BNP (last 3 results) No results for input(s): PROBNP in the last 8760 hours. HbA1C: Recent Labs    09/22/19 0433  HGBA1C 6.5*   CBG: Recent Labs  Lab 09/22/19 0758  GLUCAP 136*   Lipid Profile: No results for input(s): CHOL, HDL, LDLCALC, TRIG, CHOLHDL, LDLDIRECT in the last 72 hours. Thyroid Function Tests: Recent Labs    09/22/19 0427  TSH 3.058   Anemia Panel: No results for input(s): VITAMINB12, FOLATE, FERRITIN, TIBC, IRON, RETICCTPCT in the last 72 hours. Sepsis  Labs: Recent Labs  Lab 09/21/19 1338  LATICACIDVEN 1.8    Recent Results (from the past 240 hour(s))  Culture, blood (routine x 2)     Status: None (Preliminary result)   Collection Time: 09/21/19  1:39 PM   Specimen: BLOOD LEFT ARM  Result Value Ref Range Status   Specimen Description   Final    BLOOD LEFT ARM Performed at [facility], [place] [address] [place], [place] [address]    Special Requests   Final    BOTTLES DRAWN AEROBIC AND ANAEROBIC Blood Culture adequate volume Performed at [facility] [address], [place], [place] [address]    Culture   Final    NO GROWTH < 24 HOURS Performed at [facility] [address] [place], [place] [address]    Report Status PENDING  Incomplete  SARS CORONAVIRUS 2 (TAT 6-24 HRS) Nasopharyngeal Nasopharyngeal Swab     Status: None   Collection Time: 09/21/19  3:00 PM   Specimen: Nasopharyngeal Swab  Result Value Ref Range Status   SARS Coronavirus 2 NEGATIVE NEGATIVE Final    Comment: (NOTE) SARS-CoV-2 target nucleic acids are NOT DETECTED. The SARS-CoV-2 RNA is generally detectable in upper and lower respiratory specimens during the acute phase of infection. Negative results do not preclude SARS-CoV-2 infection, do not rule out co-infections with other pathogens, and should not be used as the sole basis for treatment or other patient management decisions. Negative results must be combined with clinical observations, patient history, and epidemiological information. The expected result is Negative. Fact Sheet for Patients: SugarRoll.be Fact Sheet for Healthcare Providers: https://www.woods-mathews.com/ This test is not yet approved or cleared by the [place] FDA and  has been authorized for detection and/or diagnosis of SARS-CoV-2 by FDA under an Emergency Use Authorization (EUA). This EUA will remain  in effect (meaning this test can be  used) for the duration of the COVID-19 declaration under Section 56 4(b)(1) of the Act, 21 U.S.C. section 360bbb-3(b)(1), unless the authorization is terminated or revoked sooner. Performed at [facility], [place] [address] [place], [place] [address]   Culture, blood (routine x 2)     Status: None (Preliminary result)   Collection Time: 09/21/19  5:52 PM   Specimen: Porta Cath; Blood  Result Value Ref Range Status   Specimen Description   Final    PORTA CATH Performed at [facility] [address]  [place], [place] [address]    Special Requests   Final    BOTTLES DRAWN AEROBIC AND ANAEROBIC Blood Culture adequate volume Performed at [facility] [address] [place], [place] [address]    Culture   Final    NO GROWTH < 12 HOURS Performed at [facility] [address], [place], [place] [address]    Report Status PENDING  Incomplete     Radiology Studies: CT ABDOMEN PELVIS WO CONTRAST  Result Date: 09/21/2019 CLINICAL DATA:  Weakness and nausea and vomiting for 1 week. Undergoing chemotherapy for breast carcinoma. EXAM: CT ABDOMEN AND PELVIS WITHOUT CONTRAST TECHNIQUE: Multidetector CT imaging of the abdomen and pelvis was performed following the standard protocol without IV contrast. COMPARISON:  08/12/2019 FINDINGS: Lower chest: No acute findings. Hepatobiliary: No mass visualized on this unenhanced exam. Focal fatty infiltration again noted adjacent to the falciform ligament. Gallbladder is unremarkable. No evidence of biliary ductal dilatation. Pancreas: No mass or inflammatory process visualized on this unenhanced exam. Spleen:  Within normal limits in size. Adrenals/Urinary tract: No evidence of urolithiasis or hydronephrosis. A tiny subcapsular lesion is again seen in the posterior upper pole the left kidney which demonstrate contrast enhancement on prior exam, suspicious for small renal cell carcinoma. Unremarkable  unopacified urinary bladder. Stomach/Bowel: No evidence of obstruction, inflammatory process, or abnormal fluid collections. Normal appendix visualized. Vascular/Lymphatic: No pathologically enlarged lymph nodes identified. No evidence of abdominal aortic aneurysm. Aortic atherosclerosis incidentally noted. Reproductive: Prior hysterectomy noted. Adnexal regions are unremarkable in appearance. Other:  None. Musculoskeletal: No suspicious bone lesions identified. Stable mild compression fracture deformity of the L2 vertebral body. IMPRESSION: No acute findings. Tiny subcapsular lesion in posterior upper pole of left kidney which showed contrast enhancement on prior exam, suspicious for renal cell carcinoma. Electronically Signed   By: Marlaine Hind M.D.   On: 09/21/2019 16:48   DG CHEST PORT 1 VIEW  Result Date: 09/21/2019 CLINICAL DATA:  Generalized weakness EXAM: PORTABLE CHEST 1 VIEW COMPARISON:  August 18, 2019 FINDINGS: The heart size and mediastinal contours are within normal limits. A left-sided MediPort catheter seen with the tip at the superior SVC. Aortic knob calcifications. Both lungs are clear. The visualized skeletal structures are unremarkable. IMPRESSION: No active disease. Electronically Signed   By: Prudencio Pair M.D.   On: 09/21/2019 16:10    Scheduled Meds: . amLODipine  10 mg Oral Daily  . atorvastatin  20 mg Oral q1800  . Chlorhexidine Gluconate Cloth  6 each Topical Daily  . cholecalciferol  2,000 Units Oral Daily  . [START ON 09/23/2019] feeding supplement  1 Container Oral TID BM  . fluticasone  2 spray Each Nare Daily  . hydrALAZINE  50 mg Oral TID  . loratadine  10 mg Oral Daily  . metoprolol tartrate  50 mg Oral BID  . sodium chloride flush  10-40 mL Intracatheter Q12H  . warfarin  5 mg Oral q1800  . Warfarin - Pharmacist Dosing Inpatient   Does not apply q1800   Continuous Infusions: . dextrose 5 % and 0.45% NaCl 75 mL/hr at 09/22/19 0405     LOS: 1 day    Marylu Lund, MD [facility] Pager On Amion  If 7PM-7AM, please contact night-coverage 09/22/2019, 4:36 PM

## 2019-09-22 NOTE — Progress Notes (Signed)
Initial Nutrition Assessment  INTERVENTION:   -Will monitor for diet advancement -Boost Breeze po TID, each supplement provides 250 kcal and 9 grams of protein  NUTRITION DIAGNOSIS:   Increased nutrient needs related to cancer and cancer related treatments as evidenced by estimated needs.  GOAL:   Patient will meet greater than or equal to 90% of their needs  MONITOR:   PO intake, Supplement acceptance, Labs, Weight trends, I & O's, Diet advancement  REASON FOR ASSESSMENT:   Malnutrition Screening Tool    ASSESSMENT:   68 y.o. female with a PMH significant for but not limited too allergic rhinitis and asthma, history of CHF and coronary artery disease, hypertension, hyperlipidemia, myopathy, history of DVT on Coumadin, history of left-sided breast cancer currently on chemotherapy seeing Dr. Nicholas Lose, as well as other comorbidities who presents with a chief complaint of nausea vomiting as well as generalized weakness and fatigue.  **RD working remotely**  Patient reports developing N/V after last chemotherapy treatment (12/7). Pt has been unable to keep food or medications down. Pt currently on clears, has not been ordering meal trays. Nausea persists.  Will order Boost Breeze supplements to start tomorrow for additional kcals and protein.  Per weight records, pt has lost 39 lbs since 11/13 (18% wt loss x 1.5 months, significant for time frame).   I/Os: +557 ml since admit UOP: 200 ml x 24 hrs  Medications: Vitamin D tablet, KLOR-CON tablet, D5 infusion  Labs reviewed: CBGs: 136 Elevated Na Low K Mg/Phos WNL  NUTRITION - FOCUSED PHYSICAL EXAM:  Working remotely.  Diet Order:   Diet Order            Diet clear liquid Room service appropriate? Yes; Fluid consistency: Thin  Diet effective now              EDUCATION NEEDS:   Not appropriate for education at this time  Skin:  Skin Assessment: Reviewed RN Assessment  Last BM:  12/18  Height:   Ht  Readings from Last 1 Encounters:  09/21/19 5\' 5"  (1.651 m)    Weight:   Wt Readings from Last 1 Encounters:  09/22/19 77.7 kg    Ideal Body Weight:  56.8 kg  BMI:  Body mass index is 28.51 kg/m.  Estimated Nutritional Needs:   Kcal:  1900-2100  Protein:  85-100g  Fluid:  2L/day  Clayton Bibles, MS, RD, LDN Inpatient Clinical Dietitian Pager: [phone] After Hours Pager: [phone]

## 2019-09-23 LAB — COMPREHENSIVE METABOLIC PANEL
ALT: 19 U/L (ref 0–44)
AST: 23 U/L (ref 15–41)
Albumin: 3 g/dL — ABNORMAL LOW (ref 3.5–5.0)
Alkaline Phosphatase: 118 U/L (ref 38–126)
Anion gap: 7 (ref 5–15)
BUN: 34 mg/dL — ABNORMAL HIGH (ref 8–23)
CO2: 23 mmol/L (ref 22–32)
Calcium: 8.3 mg/dL — ABNORMAL LOW (ref 8.9–10.3)
Chloride: 125 mmol/L — ABNORMAL HIGH (ref 98–111)
Creatinine, Ser: 2.4 mg/dL — ABNORMAL HIGH (ref 0.44–1.00)
GFR calc Af Amer: 23 mL/min — ABNORMAL LOW (ref >60)
GFR calc non Af Amer: 20 mL/min — ABNORMAL LOW (ref >60)
Glucose, Bld: 153 mg/dL — ABNORMAL HIGH (ref 70–99)
Potassium: 3.3 mmol/L — ABNORMAL LOW (ref 3.5–5.1)
Sodium: 155 mmol/L — ABNORMAL HIGH (ref 135–145)
Total Bilirubin: 0.8 mg/dL (ref 0.3–1.2)
Total Protein: 5.2 g/dL — ABNORMAL LOW (ref 6.5–8.1)

## 2019-09-23 LAB — CBC
HCT: 32.8 % — ABNORMAL LOW (ref 36.0–46.0)
Hemoglobin: 9.6 g/dL — ABNORMAL LOW (ref 12.0–15.0)
MCH: 28.9 pg (ref 26.0–34.0)
MCHC: 29.3 g/dL — ABNORMAL LOW (ref 30.0–36.0)
MCV: 98.8 fL (ref 80.0–100.0)
Platelets: 52 10*3/uL — ABNORMAL LOW (ref 150–400)
RBC: 3.32 MIL/uL — ABNORMAL LOW (ref 3.87–5.11)
RDW: 16.4 % — ABNORMAL HIGH (ref 11.5–15.5)
WBC: 17.3 10*3/uL — ABNORMAL HIGH (ref 4.0–10.5)
nRBC: 0.2 % (ref 0.0–0.2)

## 2019-09-23 LAB — PROTIME-INR
INR: 1.6 — ABNORMAL HIGH (ref 0.8–1.2)
Prothrombin Time: 19.4 seconds — ABNORMAL HIGH (ref 11.4–15.2)

## 2019-09-23 LAB — GLUCOSE, CAPILLARY: Glucose-Capillary: 142 mg/dL — ABNORMAL HIGH (ref 70–99)

## 2019-09-23 MED ORDER — POTASSIUM CHLORIDE CRYS ER 20 MEQ PO TBCR
40.0000 meq | EXTENDED_RELEASE_TABLET | Freq: Once | ORAL | Status: AC
  Administered 2019-09-23: 40 meq via ORAL
  Filled 2019-09-23: qty 2

## 2019-09-23 MED ORDER — PROMETHAZINE HCL 25 MG/ML IJ SOLN
12.5000 mg | Freq: Four times a day (QID) | INTRAMUSCULAR | Status: DC | PRN
  Administered 2019-09-23: 12.5 mg via INTRAVENOUS
  Filled 2019-09-23 (×2): qty 1

## 2019-09-23 NOTE — Progress Notes (Signed)
Tele order expired. Marylu Lund, MD advised to discontinue tele.

## 2019-09-23 NOTE — Evaluation (Signed)
Occupational Therapy Evaluation Patient Details Name: [name] Foster MRN: [id number] DOB: 01-01-1951 Today's Date: 09/23/2019    History of Present Illness Pt is a 68 year old female with "PMH significant for but not limited too allergic rhinitis and asthma, history of CHF and coronary artery disease, hypertension, hyperlipidemia, myopathy, history of DVT on Coumadin, history of left-sided breast cancer currently on chemotherapy seeing Dr. Nicholas Lose, as well as other comorbidities who presents with a chief complaint of nausea vomiting as well as generalized weakness and fatigue.  Of note patient recently had chemotherapy on 09/08/2019 and had Neulasta on 09/09/2008."   Clinical Impression   Pt admitted with above. She demonstrates the below listed deficits and will benefit from continued OT to maximize safety and independence with BADLs.  Pt presents to OT with n/v which limited her ability to fully participate.  She requires min A, overall for ADLs.  She lives with her son, who can assist intermittently.  She reports she was fully independent PTA.  Anticipate she will progress well once the N/V improves.  Will follow acutely.       Follow Up Recommendations  No OT follow up;Supervision - Intermittent    Equipment Recommendations  Tub/shower bench    Recommendations for Other Services       Precautions / Restrictions Precautions Precautions: Fall      Mobility Bed Mobility Overal bed mobility: Needs Assistance Bed Mobility: Sit to Supine     Supine to sit: Supervision     General bed mobility comments: slow to mobilize due to nausea, mild emesis  Transfers Overall transfer level: Needs assistance Equipment used: None Transfers: Sit to/from [facility] Sit to Stand: Min guard Stand pivot transfers: Min guard       General transfer comment: close min guard for safety     Balance Overall balance assessment: Mild deficits observed, not formally  tested                                         ADL either performed or assessed with clinical judgement   ADL Overall ADL's : Needs assistance/impaired Eating/Feeding: Independent   Grooming: Wash/dry hands;Wash/dry face;Oral care;Brushing hair;Min guard;Standing   Upper Body Bathing: Set up;Sitting   Lower Body Bathing: Min guard;Sit to/from stand   Upper Body Dressing : Set up;Sitting   Lower Body Dressing: Min guard;Sit to/from stand   Toilet Transfer: Min guard;Ambulation;Comfort height toilet   Toileting- Clothing Manipulation and Hygiene: Min guard;Sit to/from Nurse, children's Details (indicate cue type and reason): discussed options for tub DME with pt  Functional mobility during ADLs: Min guard General ADL Comments: Pt with nausea and vomiting which limited her ability to participate fully      Vision         Perception     Praxis      Pertinent Vitals/Pain Pain Assessment: No/denies pain     Hand Dominance     Extremity/Trunk Assessment Upper Extremity Assessment Upper Extremity Assessment: Generalized weakness   Lower Extremity Assessment Lower Extremity Assessment: Generalized weakness   Cervical / Trunk Assessment Cervical / Trunk Assessment: Normal   Communication Communication Communication: No difficulties   Cognition Arousal/Alertness: Awake/alert Behavior During Therapy: Flat affect Overall Cognitive Status: Within Functional Limits for tasks assessed  General Comments  Pt sitting in recliner with nausea, she was assisted back to bed and RN notified.      Exercises     Shoulder Instructions      Home Living Family/patient expects to be discharged to:: Private residence Living Arrangements: Children;Other relatives Available Help at Discharge: Family;Available PRN/intermittently         Home Layout: Two level Alternate Level Stairs-Number of  Steps: 13   Bathroom Shower/Tub: Tub/shower unit;Curtain   Biochemist, clinical: Standard     Home Equipment: None   Additional Comments: Pt reports her sister has been looking for tub seat for pt       Prior Functioning/Environment Level of Independence: Independent                 OT Problem List:        OT Treatment/Interventions: Self-care/ADL training;DME and/or AE instruction;Therapeutic activities;Patient/family education;Balance training    OT Goals(Current goals can be found in the care plan section) Acute Rehab OT Goals Patient Stated Goal: to feel better  OT Goal Formulation: With patient Time For Goal Achievement: 10/07/19 Potential to Achieve Goals: Good ADL Goals Pt Will Perform Grooming: with modified independence;standing Pt Will Perform Upper Body Bathing: with modified independence;sitting Pt Will Perform Lower Body Bathing: with modified independence;sit to/from stand Pt Will Perform Upper Body Dressing: with modified independence;sitting Pt Will Perform Lower Body Dressing: with modified independence;sit to/from stand Pt Will Transfer to Toilet: with modified independence;ambulating;regular height toilet Pt Will Perform Toileting - Clothing Manipulation and hygiene: with modified independence;sit to/from stand Pt Will Perform Tub/Shower Transfer: Tub transfer;with modified independence;tub bench  OT Frequency: Min 2X/week   Barriers to D/C: Decreased caregiver support  LImited support from son        Co-evaluation              AM-PAC OT "6 Clicks" Daily Activity     Outcome Measure Help from another person eating meals?: None Help from another person taking care of personal grooming?: A Little Help from another person toileting, which includes using toliet, bedpan, or urinal?: A Little Help from another person bathing (including washing, rinsing, drying)?: A Little Help from another person to put on and taking off regular upper body  clothing?: A Little Help from another person to put on and taking off regular lower body clothing?: A Little 6 Click Score: 19   End of Session Nurse Communication: Mobility status  Activity Tolerance: Treatment limited secondary to medical complications (Comment) Patient left: in bed;with call bell/phone within reach;with bed alarm set  OT Visit Diagnosis: Unsteadiness on feet (R26.81);Muscle weakness (generalized) (M62.81)                Time: 8032-1224 OT Time Calculation (min): 13 min Charges:  OT General Charges $OT Visit: 1 Visit OT Evaluation $OT Eval Moderate Complexity: 1 Mod  Nilsa Nutting., OTR/L Acute Rehabilitation Services Pager [phone] Office [phone]   Lucille Passy M 09/23/2019, 1:55 PM

## 2019-09-23 NOTE — Progress Notes (Signed)
PROGRESS NOTE    [name] Foster  TGG:[id number] DOB: 06-03-1951 DOA: 09/21/2019 PCP: Burnis Medin, MD    Brief Narrative:  68 y.o. female with a PMH significant for but not limited too allergic rhinitis and asthma, history of CHF and coronary artery disease, hypertension, hyperlipidemia, myopathy, history of DVT on Coumadin, history of left-sided breast cancer currently on chemotherapy seeing Dr. Nicholas Lose, as well as other comorbidities who presents with a chief complaint of nausea vomiting as well as generalized weakness and fatigue.  Of note patient recently had chemotherapy on 09/08/2019 and had Neulasta on 09/09/2008.  Patient states that about 4 days after her chemotherapy she started feeling sick and had nausea and vomiting and has had nausea vomiting for last 10 days without relief of her antiemetics that she was given at home.  Denies any abdominal pain or cramping and states that she has not vomited today but was having some dry heaves.  Felt significantly weaker and states that she has not had her medication since Thursday because she kept throwing them up.  Denies any chest pain or shortness of breath.  Denies any burning or discomfort in her urine and denies any other complaints or concerns at this time.  [facility] was asked to admit this patient for intractable nausea and vomiting and dehydration with significant AKI found on blood work.  ED Course: In the ED patient had basic blood work done and was found to be hypernatremic and hyperchloremic along with a significant AKI.  She is also found to have a leukocytosis and thrombocytopenia and a lactic acid level done and normal.  Of note SARS-CoV-2 point-of-care testing was negative, PCR neg  Assessment & Plan:   Active Problems:   HLD (hyperlipidemia)   Essential hypertension   Hypertensive heart disease with heart failure (HCC)   Congestive heart failure (HCC)   DVT, HX OF   Aortic insufficiency   Asthma with bronchitis   Long term  (current) use of anticoagulants   Malignant neoplasm of overlapping sites of left breast in female, estrogen receptor negative ([place])   Port-A-Cath in place   Intractable nausea and vomiting   Generalized weakness   Acute on chronic kidney failure (HCC)   Leukocytosis   Hypernatremia   Hyperchloremia   Thrombocytopenia (HCC)   Intractable Nausea/Vomiting with Unclear Etiology  -Recently had Chemotherapy on 09/08/2019 and started having Nausea and Vomiting 4 days after Treatment and Had Neulasta on 12/9 -Suspect symptoms related to recent chemo. Of note, pt reports similar symptoms after receiving previous chemo -CT abd reviewed, unremarkable -Continue with anti-emetics as needed -Have alerted Oncology of patient's admit -Continue on hydration as needed -Patient continues to have intractable nausea this AM, requiring addition of IV phenergan to other antiemetics  Generalized Weakness in the Setting of Above -C/w Supportive Treatment and IVF with D5 1/2 NS at 75 mL/hr  -PT/OT consulted, recommendation for HHPT at time of d/c  AKI on CKD Stage 3 -Likely secondary to presenting intractable Nausea and Vomiting  -Patient's BUN/Cr went from 22/1.88 -> 57/3.13 on presentation -Continued on IVF hydration -Repeat Cr now 2.40 -Recheck bmet in AM  Hypernatremia/Hyperchloremia  -Likely secondary to presenting dehydration slightly improved with IVF hydration -Na slighly improved to 155, still clinically dehydrated -Cont IVF per above  Leukocytosis -Patient's WBC had gone from 9.4 -> 24.8  -In the Setting of Dehydration from Nausea and Vomiting but Did have Neulasta Recently as well as Dexamethasone post Chemotherapy  -Blood cx neg thus  far -Urinalysis ordered at time of presentation, pending -COVID neg -Will continue to hold abx -WBC now trending down, currently 17.3 -Repeat CBC in AM  Thrombocytopenia -Patient's Platelet Count went from 169 on 12/7 -> 93 on  presentation -Continue to Monitor for S/Sx of Bleeding as she is Anticoagulated on Coumadin; Currently no overt bleeding noted -Repeat CBC in AM   Left Breast Cancer/Malignant Neoplasm overlapping sites of the Left Breast -Sees Dr. Lindi Adie in the outpatient Setting  -Have alerted Oncology of admit  HTN/Hypertensive Heart Disease  -C/w Amlodipine 10 mg po Daily in AM and Hydralazine -C/w Metoprolol Tartrate 50 mg po BID  -Continuing to hold Ramipril and Furosemide for now given AKI  -BP remains stable thus far  Chronic Diastolic CHF Cardiomyopathy and Hx of Aortic Insufficieny  -Hold ACE and Furosemide  -Strict I's and O's and Daily Weights -Had been continued on Coumadin per Pharmacy. See below. Plts now <100k, thus will hold coumadin for now -Continue to Monitor for S/Sx of Volume overload   HLD -C/w Atorvastain 20 mg po daily if able to take po   Hx of DVT -Diagnosis noted over 57yrs prior to admit -Pt had been continued on coumadin -Plts have been trending down, currently around 50k -Have discussed with pharmacy. Will hold coumadin for now -Follow CBC trends. Consider resuming coumadin when plts >100k  CAD -C/w Atorvastatin 20 mg po Daily -C/w Telemetry -Currently not complaining of any CP at this time  Hx of Asthma with Bronchitis -C/w Albuterol Inhaler 2 puff IH q6hprn Wheezing and SOB -Stable currently  Prolonged QT -QTc was 595 -C/w IVF Hydration -Repeat EKG reviewed this AM, QTc in the 460 range, improved  DVT prophylaxis: SCD's Code Status: Full Family Communication: Pt in room, family not at bedside Disposition Plan: Uncertain at this time  Consultants:     Procedures:     Antimicrobials: Anti-infectives (From admission, onward)   None      Subjective: Still nauseated. Pt reports vomiting this AM  Objective: Vitals:   09/22/19 1405 09/22/19 2102 09/23/19 0550 09/23/19 0942  BP: 136/81 132/77 139/82 129/82  Pulse: 83 87 71 78   Resp: 20 19 18    Temp: 98 F (36.7 C) 98.2 F (36.8 C) 98.3 F (36.8 C)   TempSrc: Oral Oral Oral   SpO2: 97% 100% 99%   Weight:   80.4 kg   Height:        Intake/Output Summary (Last 24 hours) at 09/23/2019 1417 Last data filed at 09/23/2019 1000 Gross per 24 hour  Intake 2396.51 ml  Output --  Net 2396.51 ml   Filed Weights   09/21/19 1220 09/22/19 0556 09/23/19 0550  Weight: 87.1 kg 77.7 kg 80.4 kg    Examination: General exam: Awake, sitting in chair, appears uncomfortable Respiratory system: Normal respiratory effort, no wheezing Cardiovascular system: regular rate, s1, s2 Gastrointestinal system: Soft, nondistended, positive BS Central nervous system: CN2-12 grossly intact, strength intact Extremities: Perfused, no clubbing Skin: Normal skin turgor, no notable skin lesions seen Psychiatry: Mood normal // no visual hallucinations   Data Reviewed: I have personally reviewed following labs and imaging studies  CBC: Recent Labs  Lab 09/21/19 1336 09/22/19 0433 09/23/19 0351  WBC 24.8* 22.6* 17.3*  NEUTROABS 20.5* 18.1*  --   HGB 12.4 10.4* 9.6*  HCT 41.7 35.5* 32.8*  MCV 98.6 98.1 98.8  PLT 93* 70* 52*   Basic Metabolic Panel: Recent Labs  Lab 09/21/19 1336 09/21/19 1752 09/22/19 0433 09/23/19  0351  NA 157*  --  156* 155*  K 3.9  --  3.3* 3.3*  CL 118*  --  123* 125*  CO2 22  --  23 23  GLUCOSE 107*  --  182* 153*  BUN 57*  --  46* 34*  CREATININE 3.13*  --  2.57* 2.40*  CALCIUM 9.1  --  8.5* 8.3*  MG  --  2.3 2.4  --   PHOS  --  3.4 2.8  --    GFR: Estimated Creatinine Clearance: 23.8 mL/min (A) (by C-G formula based on SCr of 2.4 mg/dL (H)). Liver Function Tests: Recent Labs  Lab 09/21/19 1336 09/22/19 0433 09/23/19 0351  AST 36 30 23  ALT 23 20 19   ALKPHOS 154* 148* 118  BILITOT 0.7 0.5 0.8  PROT 6.6 5.7* 5.2*  ALBUMIN 3.6 3.2* 3.0*   No results for input(s): LIPASE, AMYLASE in the last 168 hours. No results for input(s):  AMMONIA in the last 168 hours. Coagulation Profile: Recent Labs  Lab 09/21/19 1801 09/22/19 0433 09/23/19 0351  INR 1.1 1.2 1.6*   Cardiac Enzymes: No results for input(s): CKTOTAL, CKMB, CKMBINDEX, TROPONINI in the last 168 hours. BNP (last 3 results) No results for input(s): PROBNP in the last 8760 hours. HbA1C: Recent Labs    09/22/19 0433  HGBA1C 6.5*   CBG: Recent Labs  Lab 09/22/19 0758 09/23/19 0729  GLUCAP 136* 142*   Lipid Profile: No results for input(s): CHOL, HDL, LDLCALC, TRIG, CHOLHDL, LDLDIRECT in the last 72 hours. Thyroid Function Tests: Recent Labs    09/22/19 0427  TSH 3.058   Anemia Panel: No results for input(s): VITAMINB12, FOLATE, FERRITIN, TIBC, IRON, RETICCTPCT in the last 72 hours. Sepsis Labs: Recent Labs  Lab 09/21/19 1338  LATICACIDVEN 1.8    Recent Results (from the past 240 hour(s))  Culture, blood (routine x 2)     Status: None (Preliminary result)   Collection Time: 09/21/19  1:39 PM   Specimen: BLOOD LEFT ARM  Result Value Ref Range Status   Specimen Description   Final    BLOOD LEFT ARM Performed at [facility], [place] [address], [place], [place] [address]    Special Requests   Final    BOTTLES DRAWN AEROBIC AND ANAEROBIC Blood Culture adequate volume Performed at [facility] [address] [place], [place] [address]    Culture   Final    NO GROWTH 2 DAYS Performed at [facility] [address], [place], [place] [address]    Report Status PENDING  Incomplete  SARS CORONAVIRUS 2 (TAT 6-24 HRS) Nasopharyngeal Nasopharyngeal Swab     Status: None   Collection Time: 09/21/19  3:00 PM   Specimen: Nasopharyngeal Swab  Result Value Ref Range Status   SARS Coronavirus 2 NEGATIVE NEGATIVE Final    Comment: (NOTE) SARS-CoV-2 target nucleic acids are NOT DETECTED. The SARS-CoV-2 RNA is generally detectable in upper and lower respiratory specimens during the acute phase of  infection. Negative results do not preclude SARS-CoV-2 infection, do not rule out co-infections with other pathogens, and should not be used as the sole basis for treatment or other patient management decisions. Negative results must be combined with clinical observations, patient history, and epidemiological information. The expected result is Negative. Fact Sheet for Patients: SugarRoll.be Fact Sheet for Healthcare Providers: https://www.woods-mathews.com/ This test is not yet approved or cleared by the [place] FDA and  has been authorized for detection and/or  diagnosis of SARS-CoV-2 by FDA under an Emergency Use Authorization (EUA). This EUA will remain  in effect (meaning this test can be used) for the duration of the COVID-19 declaration under Section 56 4(b)(1) of the Act, 21 U.S.C. section 360bbb-3(b)(1), unless the authorization is terminated or revoked sooner. Performed at [facility], [place] [address], [place], [place] [address]   Culture, blood (routine x 2)     Status: None (Preliminary result)   Collection Time: 09/21/19  5:52 PM   Specimen: Porta Cath; Blood  Result Value Ref Range Status   Specimen Description   Final    PORTA CATH Performed at [facility] [address], [place], [place] [address]    Special Requests   Final    BOTTLES DRAWN AEROBIC AND ANAEROBIC Blood Culture adequate volume Performed at [facility] [address] [place], [place] [address]    Culture   Final    NO GROWTH 2 DAYS Performed at [facility] [address], [place], [place] [address]    Report Status PENDING  Incomplete     Radiology Studies: CT ABDOMEN PELVIS WO CONTRAST  Result Date: 09/21/2019 CLINICAL DATA:  Weakness and nausea and vomiting for 1 week. Undergoing chemotherapy for breast carcinoma. EXAM: CT ABDOMEN AND PELVIS WITHOUT CONTRAST TECHNIQUE: Multidetector CT  imaging of the abdomen and pelvis was performed following the standard protocol without IV contrast. COMPARISON:  08/12/2019 FINDINGS: Lower chest: No acute findings. Hepatobiliary: No mass visualized on this unenhanced exam. Focal fatty infiltration again noted adjacent to the falciform ligament. Gallbladder is unremarkable. No evidence of biliary ductal dilatation. Pancreas: No mass or inflammatory process visualized on this unenhanced exam. Spleen:  Within normal limits in size. Adrenals/Urinary tract: No evidence of urolithiasis or hydronephrosis. A tiny subcapsular lesion is again seen in the posterior upper pole the left kidney which demonstrate contrast enhancement on prior exam, suspicious for small renal cell carcinoma. Unremarkable unopacified urinary bladder. Stomach/Bowel: No evidence of obstruction, inflammatory process, or abnormal fluid collections. Normal appendix visualized. Vascular/Lymphatic: No pathologically enlarged lymph nodes identified. No evidence of abdominal aortic aneurysm. Aortic atherosclerosis incidentally noted. Reproductive: Prior hysterectomy noted. Adnexal regions are unremarkable in appearance. Other:  None. Musculoskeletal: No suspicious bone lesions identified. Stable mild compression fracture deformity of the L2 vertebral body. IMPRESSION: No acute findings. Tiny subcapsular lesion in posterior upper pole of left kidney which showed contrast enhancement on prior exam, suspicious for renal cell carcinoma. Electronically Signed   By: Marlaine Hind M.D.   On: 09/21/2019 16:48   DG CHEST PORT 1 VIEW  Result Date: 09/21/2019 CLINICAL DATA:  Generalized weakness EXAM: PORTABLE CHEST 1 VIEW COMPARISON:  August 18, 2019 FINDINGS: The heart size and mediastinal contours are within normal limits. A left-sided MediPort catheter seen with the tip at the superior SVC. Aortic knob calcifications. Both lungs are clear. The visualized skeletal structures are unremarkable. IMPRESSION: No  active disease. Electronically Signed   By: Prudencio Pair M.D.   On: 09/21/2019 16:10    Scheduled Meds: . amLODipine  10 mg Oral Daily  . atorvastatin  20 mg Oral q1800  . Chlorhexidine Gluconate Cloth  6 each Topical Daily  . cholecalciferol  2,000 Units Oral Daily  . feeding supplement  1 Container Oral TID BM  . fluticasone  2 spray Each Nare Daily  . hydrALAZINE  50 mg Oral TID  . loratadine  10 mg Oral Daily  . metoprolol tartrate  50 mg Oral BID  . sodium chloride flush  10-40 mL Intracatheter Q12H  . warfarin  5 mg Oral q1800  . Warfarin - Pharmacist Dosing Inpatient   Does not apply q1800   Continuous Infusions: . dextrose 5 % and 0.45% NaCl 75 mL/hr at 09/23/19 0953     LOS: 2 days   Marylu Lund, MD [facility] Pager On Amion  If 7PM-7AM, please contact night-coverage 09/23/2019, 2:17 PM

## 2019-09-23 NOTE — Progress Notes (Addendum)
[place] for warfarin Indication: VTE treatment  Allergies  Allergen Reactions  . Penicillins Rash    Did it involve swelling of the face/tongue/throat, SOB, or low BP? Unknown Did it involve sudden or severe rash/hives, skin peeling, or any reaction on the inside of your mouth or nose? Yes Did you need to seek medical attention at a hospital or doctor's office? Yes When did it last happen?in her 26s If all above answers are "NO", may proceed with cephalosporin use.   . Tetanus Toxoid Rash    Caused cellulitis   . Aspirin Rash   Patient Measurements: Height: 5\' 5"  (165.1 cm) Weight: 177 lb 4 oz (80.4 kg) IBW/kg (Calculated) : 57 Heparin Dosing Weight:   Vital Signs: Temp: 98.3 F (36.8 C) (12/22 0550) Temp Source: Oral (12/22 0550) BP: 129/82 (12/22 0942) Pulse Rate: 78 (12/22 0942)  Labs: Recent Labs    09/21/19 1336 09/21/19 1801 09/22/19 0433 09/23/19 0351  HGB 12.4  --  10.4* 9.6*  HCT 41.7  --  35.5* 32.8*  PLT 93*  --  70* 52*  LABPROT  --  14.3 15.5* 19.4*  INR  --  1.1 1.2 1.6*  CREATININE 3.13*  --  2.57* 2.40*   Estimated Creatinine Clearance: 23.8 mL/min (A) (by C-G formula based on SCr of 2.4 mg/dL (H)).  Medical History: Past Medical History:  Diagnosis Date  . Allergic rhinitis   . Aortic valve disorders   . Arthritis   . Asthma   . CHF (congestive heart failure) ([place])   . Coronary artery disease   . Family history of adverse reaction to anesthesia    difficulty waking mother up after surgery  . Family history of breast cancer   . Family history of cervical cancer   . Family history of colon cancer   . Family history of throat cancer   . Heart murmur   . Hyperlipidemia   . Hypertension   . Long term (current) use of anticoagulants   . Other primary cardiomyopathies   . Personal history of colonic polyps   . Personal history of venous thrombosis and embolism     Medications:  Scheduled:   . amLODipine  10 mg Oral Daily  . atorvastatin  20 mg Oral q1800  . Chlorhexidine Gluconate Cloth  6 each Topical Daily  . cholecalciferol  2,000 Units Oral Daily  . feeding supplement  1 Container Oral TID BM  . fluticasone  2 spray Each Nare Daily  . hydrALAZINE  50 mg Oral TID  . loratadine  10 mg Oral Daily  . metoprolol tartrate  50 mg Oral BID  . sodium chloride flush  10-40 mL Intracatheter Q12H  . warfarin  5 mg Oral q1800  . Warfarin - Pharmacist Dosing Inpatient   Does not apply q1800   Assessment: 36 yoF on chronic warfarin for hx of VTE.  Last dose > 7 days prior to admit per med rec.  INR 1.1 on admit. Home dose 5mg  daily exc 2.5mg  qWed,   Goal of Therapy:  INR 2-3   Today, 09/23/2019 INR 1.6 Hgb 12.4 >> 9.6, Plt 93 >> 52   Plan:  Daily INR Holding Warfarin for low platelets per MD order  Minda Ditto PharmD 09/23/2019,9:58 AM

## 2019-09-23 NOTE — Evaluation (Signed)
Physical Therapy Evaluation Patient Details Name: [name] Foster MRN: [id number] DOB: [birth year] Today's Date: 09/23/2019   History of Present Illness  Pt is a 68 year old female with "PMH significant for but not limited too allergic rhinitis and asthma, history of CHF and coronary artery disease, hypertension, hyperlipidemia, myopathy, history of DVT on Coumadin, history of left-sided breast cancer currently on chemotherapy seeing Dr. Nicholas Lose, as well as other comorbidities who presents with a chief complaint of nausea vomiting as well as generalized weakness and fatigue.  Of note patient recently had chemotherapy on 09/08/2019 and had Neulasta on 09/09/2008."  Clinical Impression  Pt admitted with above diagnosis.  Pt currently with functional limitations due to the deficits listed below (see PT Problem List). Pt will benefit from skilled PT to increase their independence and safety with mobility to allow discharge to the venue listed below.  Pt reports ongoing N/V for 2 weeks prior to admission.  Pt agreeable to OOB to recliner however felt unable to ambulate at this time.       Follow Up Recommendations Home health PT(vs no f/u, pending progress)    Equipment Recommendations  Rolling walker with 5" wheels    Recommendations for Other Services       Precautions / Restrictions Precautions Precautions: Fall Restrictions Weight Bearing Restrictions: No      Mobility  Bed Mobility Overal bed mobility: Needs Assistance Bed Mobility: Supine to Sit     Supine to sit: Min guard;HOB elevated     General bed mobility comments: slow to mobilize due to nausea, mild emesis  Transfers Overall transfer level: Needs assistance Equipment used: Rolling walker (2 wheeled) Transfers: Sit to/from [facility] Sit to Stand: Min guard Stand pivot transfers: Min guard       General transfer comment: min/guard for safety, pt performed UE support with RW, agreeable to OOB  to recliner, did not feel able to tolerate ambulation at this time  Ambulation/Gait                Stairs            Wheelchair Mobility    Modified Rankin (Stroke Patients Only)       Balance Overall balance assessment: (denies falls)                                           Pertinent Vitals/Pain Pain Assessment: No/denies pain    Home Living Family/patient expects to be discharged to:: Private residence Living Arrangements: Children;Other relatives Available Help at Discharge: Family;Available PRN/intermittently         Home Layout: Two level Home Equipment: None      Prior Function Level of Independence: Independent               Hand Dominance        Extremity/Trunk Assessment        Lower Extremity Assessment Lower Extremity Assessment: Generalized weakness       Communication   Communication: No difficulties  Cognition Arousal/Alertness: Awake/alert Behavior During Therapy: Flat affect Overall Cognitive Status: Within Functional Limits for tasks assessed                                        General Comments      Exercises  Assessment/Plan    PT Assessment Patient needs continued PT services  PT Problem List Decreased strength;Decreased mobility;Decreased knowledge of use of DME;Decreased activity tolerance       PT Treatment Interventions DME instruction;Therapeutic activities;Gait training;Therapeutic exercise;Patient/family education;Functional mobility training;Stair training    PT Goals (Current goals can be found in the Care Plan section)  Acute Rehab PT Goals PT Goal Formulation: With patient Time For Goal Achievement: 10/07/19 Potential to Achieve Goals: Good    Frequency Min 3X/week   Barriers to discharge        Co-evaluation               AM-PAC PT "6 Clicks" Mobility  Outcome Measure Help needed turning from your back to your side while in a flat  bed without using bedrails?: A Little Help needed moving from lying on your back to sitting on the side of a flat bed without using bedrails?: A Little Help needed moving to and from a bed to a chair (including a wheelchair)?: A Little Help needed standing up from a chair using your arms (e.g., wheelchair or bedside chair)?: A Little Help needed to walk in hospital room?: A Little Help needed climbing 3-5 steps with a railing? : A Little 6 Click Score: 18    End of Session   Activity Tolerance: Patient limited by fatigue Patient left: in chair;with chair alarm set;with call bell/phone within reach Nurse Communication: Mobility status PT Visit Diagnosis: Difficulty in walking, not elsewhere classified (R26.2);Muscle weakness (generalized) (M62.81)    Time: 2683-4196 PT Time Calculation (min) (ACUTE ONLY): 17 min   Charges:   PT Evaluation $PT Eval Low Complexity: 1 Low     Kati PT, DPT Acute Rehabilitation Services Office: [phone]   Diahn Waidelich,KATHrine E 09/23/2019, 11:01 AM

## 2019-09-24 DIAGNOSIS — N179 Acute kidney failure, unspecified: Principal | ICD-10-CM

## 2019-09-24 DIAGNOSIS — D6959 Other secondary thrombocytopenia: Secondary | ICD-10-CM

## 2019-09-24 DIAGNOSIS — Z86718 Personal history of other venous thrombosis and embolism: Secondary | ICD-10-CM

## 2019-09-24 DIAGNOSIS — R197 Diarrhea, unspecified: Secondary | ICD-10-CM

## 2019-09-24 DIAGNOSIS — E870 Hyperosmolality and hypernatremia: Secondary | ICD-10-CM

## 2019-09-24 DIAGNOSIS — D6481 Anemia due to antineoplastic chemotherapy: Secondary | ICD-10-CM

## 2019-09-24 DIAGNOSIS — C50812 Malignant neoplasm of overlapping sites of left female breast: Secondary | ICD-10-CM

## 2019-09-24 DIAGNOSIS — E860 Dehydration: Secondary | ICD-10-CM

## 2019-09-24 DIAGNOSIS — D72828 Other elevated white blood cell count: Secondary | ICD-10-CM

## 2019-09-24 DIAGNOSIS — R112 Nausea with vomiting, unspecified: Secondary | ICD-10-CM

## 2019-09-24 LAB — COMPREHENSIVE METABOLIC PANEL
ALT: 20 U/L (ref 0–44)
AST: 22 U/L (ref 15–41)
Albumin: 2.6 g/dL — ABNORMAL LOW (ref 3.5–5.0)
Alkaline Phosphatase: 100 U/L (ref 38–126)
Anion gap: 5 (ref 5–15)
BUN: 24 mg/dL — ABNORMAL HIGH (ref 8–23)
CO2: 21 mmol/L — ABNORMAL LOW (ref 22–32)
Calcium: 8.1 mg/dL — ABNORMAL LOW (ref 8.9–10.3)
Chloride: 125 mmol/L — ABNORMAL HIGH (ref 98–111)
Creatinine, Ser: 2.23 mg/dL — ABNORMAL HIGH (ref 0.44–1.00)
GFR calc Af Amer: 26 mL/min — ABNORMAL LOW (ref >60)
GFR calc non Af Amer: 22 mL/min — ABNORMAL LOW (ref >60)
Glucose, Bld: 129 mg/dL — ABNORMAL HIGH (ref 70–99)
Potassium: 3.4 mmol/L — ABNORMAL LOW (ref 3.5–5.1)
Sodium: 151 mmol/L — ABNORMAL HIGH (ref 135–145)
Total Bilirubin: 0.3 mg/dL (ref 0.3–1.2)
Total Protein: 5 g/dL — ABNORMAL LOW (ref 6.5–8.1)

## 2019-09-24 LAB — PROTIME-INR
INR: 2.5 — ABNORMAL HIGH (ref 0.8–1.2)
Prothrombin Time: 27.2 seconds — ABNORMAL HIGH (ref 11.4–15.2)

## 2019-09-24 LAB — CBC
HCT: 30 % — ABNORMAL LOW (ref 36.0–46.0)
Hemoglobin: 8.7 g/dL — ABNORMAL LOW (ref 12.0–15.0)
MCH: 28.6 pg (ref 26.0–34.0)
MCHC: 29 g/dL — ABNORMAL LOW (ref 30.0–36.0)
MCV: 98.7 fL (ref 80.0–100.0)
Platelets: 44 10*3/uL — ABNORMAL LOW (ref 150–400)
RBC: 3.04 MIL/uL — ABNORMAL LOW (ref 3.87–5.11)
RDW: 16.4 % — ABNORMAL HIGH (ref 11.5–15.5)
WBC: 13.8 10*3/uL — ABNORMAL HIGH (ref 4.0–10.5)
nRBC: 0.3 % — ABNORMAL HIGH (ref 0.0–0.2)

## 2019-09-24 LAB — GLUCOSE, CAPILLARY: Glucose-Capillary: 107 mg/dL — ABNORMAL HIGH (ref 70–99)

## 2019-09-24 MED ORDER — DEXAMETHASONE SODIUM PHOSPHATE 4 MG/ML IJ SOLN
8.0000 mg | INTRAMUSCULAR | Status: DC
  Administered 2019-09-24 – 2019-10-02 (×9): 8 mg via INTRAVENOUS
  Filled 2019-09-24 (×10): qty 2

## 2019-09-24 MED ORDER — SODIUM CHLORIDE 0.9 % IV SOLN
8.0000 mg | Freq: Three times a day (TID) | INTRAVENOUS | Status: DC
  Administered 2019-09-24 – 2019-09-30 (×17): 8 mg via INTRAVENOUS
  Filled 2019-09-24 (×18): qty 4

## 2019-09-24 MED ORDER — LORAZEPAM 0.5 MG PO TABS
0.5000 mg | ORAL_TABLET | Freq: Four times a day (QID) | ORAL | Status: DC | PRN
  Filled 2019-09-24: qty 1

## 2019-09-24 MED ORDER — SODIUM CHLORIDE 0.9 % IV SOLN
8.0000 mg | Freq: Three times a day (TID) | INTRAVENOUS | Status: DC | PRN
  Filled 2019-09-24: qty 4

## 2019-09-24 MED ORDER — SODIUM BICARBONATE 650 MG PO TABS
650.0000 mg | ORAL_TABLET | Freq: Two times a day (BID) | ORAL | Status: DC
  Administered 2019-09-24 – 2019-09-26 (×5): 650 mg via ORAL
  Filled 2019-09-24 (×6): qty 1

## 2019-09-24 MED ORDER — ONDANSETRON HCL 4 MG PO TABS: 8.0000 mg | ORAL_TABLET | Freq: Three times a day (TID) | ORAL | Status: DC | PRN

## 2019-09-24 MED ORDER — PANTOPRAZOLE SODIUM 40 MG IV SOLR
40.0000 mg | Freq: Two times a day (BID) | INTRAVENOUS | Status: DC
  Administered 2019-09-24 – 2019-10-02 (×17): 40 mg via INTRAVENOUS
  Filled 2019-09-24 (×18): qty 40

## 2019-09-24 MED ORDER — POTASSIUM CL IN DEXTROSE 5% 20 MEQ/L IV SOLN
20.0000 meq | INTRAVENOUS | Status: DC
  Administered 2019-09-24 – 2019-09-25 (×3): 20 meq via INTRAVENOUS
  Filled 2019-09-24 (×5): qty 1000

## 2019-09-24 MED ORDER — POTASSIUM CHLORIDE CRYS ER 20 MEQ PO TBCR
40.0000 meq | EXTENDED_RELEASE_TABLET | Freq: Once | ORAL | Status: AC
  Administered 2019-09-24: 40 meq via ORAL
  Filled 2019-09-24: qty 2

## 2019-09-24 NOTE — Progress Notes (Signed)
PROGRESS NOTE    [name] Foster  HWE:[id number] DOB: 1951/08/13 DOA: 09/21/2019 PCP: Burnis Medin, MD   Brief Narrative:  [name] Foster is a 68 y.o. female with a PMH significant for but not limited too allergic rhinitis and asthma, history of CHF and coronary artery disease, hypertension, hyperlipidemia, myopathy, history of DVT on Coumadin, history of left-sided breast cancer currently on chemotherapy seeing Dr. Nicholas Lose, as well as other comorbidities who presents with a chief complaint of nausea vomiting as well as generalized weakness and fatigue.  Of note patient recently had chemotherapy on 09/08/2019 and had Neulasta on 09/09/2008.  Patient states that about 4 days after her chemotherapy she started feeling sick and had nausea and vomiting and has had nausea vomiting for last 10 days without relief of her antiemetics that she was given at home.  Denies any abdominal pain or cramping and states that she has not vomited today but was having some dry heaves.  Felt significantly weaker and states that she has not had her medication since Thursday because she kept throwing them up.  Denies any chest pain or shortness of breath.  Denies any burning or discomfort in her urine and denies any other complaints or concerns at this time.  TRH was asked to admit this patient for intractable nausea and vomiting and dehydration with significant AKI found on blood work.  ED Course: In the ED patient had basic blood work done and was found to be hypernatremic and hyperchloremic along with a significant AKI.  She is also found to have a leukocytosis and thrombocytopenia and a lactic acid level done and normal.  Of note SARS-CoV-2 point-of-care testing was negative but 64-hour PCR turnaround time is still pending and she will be kept as a patient under investigation until the results given her symptomatology.  **Interim History Continues have persistent nausea and vomiting.  The smell of food made her  nauseous this morning.  Renal function slowly trending down as well as her hyponatremia.  Oncology trying several antiemetics.  I will schedule PPI for the patient.  If not improving may need a formal gastroenterology evaluation.  Assessment & Plan:   Active Problems:   HLD (hyperlipidemia)   Essential hypertension   Hypertensive heart disease with heart failure (HCC)   Congestive heart failure (HCC)   DVT, HX OF   Aortic insufficiency   Asthma with bronchitis   Long term (current) use of anticoagulants   Malignant neoplasm of overlapping sites of left breast in female, estrogen receptor negative ([place])   Port-A-Cath in place   Intractable nausea and vomiting   Generalized weakness   Acute on chronic kidney failure (HCC)   Leukocytosis   Hypernatremia   Hyperchloremia   Thrombocytopenia (HCC)  Intractable Nausea/Vomiting with Unclear Etiology; persistent  -Recently had Chemotherapy on 09/08/2019 and started having Nausea and Vomiting 4 days after Treatment and Had Neulasta on 12/9 -Suspect Related to Chemotherapy  -Check CT Abdomen and Pelvis w/o Contrast and showed "No acute findings. Tiny subcapsular lesion in posterior upper pole of left kidney which showed contrast enhancement on prior exam, suspicious for renal cell carcinoma." -Place on CLD -C/w Supportive Care and Antiemetics but will need judicious usage given prolonged QTC. -She is on several antiemetics now and is on ondansetron 8 mg IV every 8 hours scheduled with 8 mg p.o. every 8 as needed for nausea vomiting along with promethazine 12.5 mg IV every 6 as needed for refractory nausea vomiting as well as 10  mg of Compazine every 6 hours as needed for nausea vomiting; medical oncology had now started her on dexamethasone 8 mg daily -Continued IVF Hydration with D5 1/2 NS at 75 mL/hr  but this has now been changed to just D5W with 20 mEq of KCl at 75 mL's per hour given her continued Hypernatremia -We will add scheduled IV PPI  twice daily 40 mEq at the recommendations of Dr. Ardis Hughs.  I spoke with him today about the patient's case and he states that this is not improving the patient that we will need to call for a formal consult in the a.m. -Medical oncology is following and they have recommended the above antiemetics -Edema feels that if there is no improvement by tomorrow we will add olanzapine -She is also on lorazepam 0.5 mg p.o./sublingual for nausea vomiting every 6 hours for as needed  Generalized Weakness in the Setting of Above -C/w Supportive Treatment and IVF  change as above -Get PT/OT to evaluate and Treat and they are recommending HHPT/OT at time of D/C  AKI on CKD Stage 3 Metabolic Acidosis; In the setting of N/V and Hyperchloremia  -In the Setting of Nausea and Vomiting  -Patient's BUN/Cr went from 22/1.88 -> 57/3.13 -> 46/2.57 -> 34/2.40 -> 24/2.23 -Patient's CO2 was 21, AG was 5 and Chloride Level was 125 -Added Sodium Bicarbonate 650 mg po BID  -Changed IVF as above; Still appears dehydrated somewhat  -Avoid Nephrotoxic Medications, Contrast Dyes, Hypotension if possible and Renally Dose Medications -Repeat CMP in AM   Hypernatremia/Hyperchloremia -In the setting of Dehydration -Given 2 Liters of NS in the ED -Give D5 1/2 NS at 75 mL/hr but changed to D5W + KCl 20 mEQ for today and likely will stop tomorrow and avoidance of significant volume overload -Continue to Monitor and Trend -Repeat CMP in AM   Leukocytosis -Patient's WBC had gone from 9.4 -> 24.8 -> 22.6 -> 17.3 -> 13.8 -In the Setting of Dehydration from Nausea and Vomiting but Did have Neulasta Recently as well as Dexamethasone post Chemotherapy  -Pan-Cx Patient and Check Blood Cx x2, Urinalysis, and Urine Cx -CXR on Admission not Done so will order one now and will also order CT Abd/Pelvis w/o Contrast due to Nausea and Vomiting  -Of Note SARS-CoV-2 Testing Negative  -Hold of Abx at this point until Cx's are obtained;  Currently Afebrile and LA was 1.8 -Continue to Monitor and Trend WBC -Repeat CBC in AM   Thrombocytopenia, worsening  -Patient's Platelet Count went from 169 on 12/7 -> 93  -> 70 -> 52 -> 44 -Oncology recommend Transfusion for Platelet Count -Continue to Monitor for S/Sx of Bleeding as she was Anticoagulated on Coumadin; Currently no overt bleeding noted and Coumadin is held  -Repeat CBC in AM   Left Breast Cancer/Malignant Neoplasm overlapping sites of the Left Breast -Sees Dr. Lindi Adie in the outpatient Setting  -Appreciate Dr. Geralyn Flash evaluation   HTN/Hypertensive Heart Disease  -C/w Amlodipine 10 mg po Daily in AM and Hydralazine 50 mg po TID -C/w Metoprolol Tartrate 50 mg po BID  -Hold Ramipril and Furosemide for now given AKI   Chronic Diastolic CHF Cardiomyopathy and Hx of Aortic Insufficieny  -Hold ACE and Furosemide  -Strict I's and O's and Daily Weights; She is +4.673 Liters since Admission but clinically still appears somewhat dehydrated -Weight has gone up from 171- > 198 however on admission she was 192 -Continued Coumadin per Pharmacy initially; Now stopped  -Continue to Monitor for S/Sx of  Volume overload   Hyperkalemia -Patient's K+ is 3.4 -Mag Level not checked today  -Replete with po KCl 40 mEQ po Daily  -Continue to Monitor and Replete as Necessary -Repeat CMP in AM   HLD -C/w Atorvastain 20 mg po daily if able to take po   Hx of DVT -Holding Coumadin now per Oncology until Platelet Count >100,000  CAD -C/w Atorvastatin 20 mg po Daily -C/w Telemetry -Currently not complaining of any CP  Hx of Asthma with Bronchitis -C/w Albuterol Inhaler 2 puff IH q6hprn Wheezing and SOB  Prolonged QT -QTc was 595 but now repeated  -C/w IVF Hydration as Above -C/w Telemetry Monitoring -Repeat EKG in AM -Check Mag and Phos  -Avoid QT prolonging agents if possible  Obesity -Estimated body mass index is 32.98 kg/m as calculated from the following:    Height as of this encounter: 5\' 5"  (1.651 m).   Weight as of this encounter: 89.9 kg. -Weight Loss and Dietary Counseling given   DVT prophylaxis: SCDs as her platelet count is low and has thrombocytopenia Code Status: FULL CODE  Family Communication: No family present at bedside  Disposition Plan: Pending further clinical improvement and workup   Consultants:   Medical Oncology  Discussed case with GI    Procedures: None   Antimicrobials:  Anti-infectives (From admission, onward)   None     Subjective: Seen and examined at bedside and still has some persistent nausea and does not feel well.  Denies any lightheadedness or dizziness and states that the smell of food made her extremely nauseous today.  Denies any chest pain or shortness of breath.  No other concerns requested this time.  Objective: Vitals:   09/24/19 0850 09/24/19 1021 09/24/19 1408 09/24/19 1617  BP:  138/81 129/76 135/76  Pulse:  73 72 76  Resp:   14   Temp:   98.5 F (36.9 C)   TempSrc:   Oral   SpO2:  99% 96% 98%  Weight: 89.9 kg     Height:        Intake/Output Summary (Last 24 hours) at 09/24/2019 1744 Last data filed at 09/24/2019 0600 Gross per 24 hour  Intake 1694.12 ml  Output --  Net 1694.12 ml   Filed Weights   09/22/19 0556 09/23/19 0550 09/24/19 0850  Weight: 77.7 kg 80.4 kg 89.9 kg   Examination: Physical Exam:  Constitutional: WN/WD obese African-American female appears in some mild distress and does appear uncomfortable Eyes: Lids and conjunctivae normal, sclerae anicteric  ENMT: External Ears, Nose appear normal. Grossly normal hearing. Mucous membranes are still somewhat dry Neck: Appears normal, supple, no cervical masses, normal ROM, no appreciable thyromegaly; no JVD Respiratory: Diminished to auscultation bilaterally with coarse breath sounds, no wheezing, rales, rhonchi or crackles. Normal respiratory effort and patient is not tachypenic. No accessory muscle use.  Unlabored breathing  Cardiovascular: RRR, no murmurs / rubs / gallops. S1 and S2 auscultated. Trace extremity edema. Abdomen: Soft, tender, Distended. Bowel sounds positive x4.  GU: Deferred. Musculoskeletal: No clubbing / cyanosis of digits/nails. No joint deformity upper and lower extremities.  Skin: No rashes, lesions, ulcers on a limited skin evaluation. No induration; Warm and dry.  Neurologic: CN 2-12 grossly intact with no focal deficits. Romberg sign and cerebellar reflexes not assessed.  Psychiatric: Normal judgment and insight. Alert and oriented x 3. Anxious mood and appropriate affect.   Data Reviewed: I have personally reviewed following labs and imaging studies  CBC: Recent Labs  Lab  09/21/19 1336 09/22/19 0433 09/23/19 0351 09/24/19 0450  WBC 24.8* 22.6* 17.3* 13.8*  NEUTROABS 20.5* 18.1*  --   --   HGB 12.4 10.4* 9.6* 8.7*  HCT 41.7 35.5* 32.8* 30.0*  MCV 98.6 98.1 98.8 98.7  PLT 93* 70* 52* 44*   Basic Metabolic Panel: Recent Labs  Lab 09/21/19 1336 09/21/19 1752 09/22/19 0433 09/23/19 0351 09/24/19 0450  NA 157*  --  156* 155* 151*  K 3.9  --  3.3* 3.3* 3.4*  CL 118*  --  123* 125* 125*  CO2 22  --  23 23 21*  GLUCOSE 107*  --  182* 153* 129*  BUN 57*  --  46* 34* 24*  CREATININE 3.13*  --  2.57* 2.40* 2.23*  CALCIUM 9.1  --  8.5* 8.3* 8.1*  MG  --  2.3 2.4  --   --   PHOS  --  3.4 2.8  --   --    GFR: Estimated Creatinine Clearance: 27.1 mL/min (A) (by C-G formula based on SCr of 2.23 mg/dL (H)). Liver Function Tests: Recent Labs  Lab 09/21/19 1336 09/22/19 0433 09/23/19 0351 09/24/19 0450  AST 36 30 23 22   ALT 23 20 19 20   ALKPHOS 154* 148* 118 100  BILITOT 0.7 0.5 0.8 0.3  PROT 6.6 5.7* 5.2* 5.0*  ALBUMIN 3.6 3.2* 3.0* 2.6*   No results for input(s): LIPASE, AMYLASE in the last 168 hours. No results for input(s): AMMONIA in the last 168 hours. Coagulation Profile: Recent Labs  Lab 09/21/19 1801 09/22/19 0433 09/23/19 0351  09/24/19 0450  INR 1.1 1.2 1.6* 2.5*   Cardiac Enzymes: No results for input(s): CKTOTAL, CKMB, CKMBINDEX, TROPONINI in the last 168 hours. BNP (last 3 results) No results for input(s): PROBNP in the last 8760 hours. HbA1C: Recent Labs    09/22/19 0433  HGBA1C 6.5*   CBG: Recent Labs  Lab 09/22/19 0758 09/23/19 0729 09/24/19 0829  GLUCAP 136* 142* 107*   Lipid Profile: No results for input(s): CHOL, HDL, LDLCALC, TRIG, CHOLHDL, LDLDIRECT in the last 72 hours. Thyroid Function Tests: Recent Labs    09/22/19 0427  TSH 3.058   Anemia Panel: No results for input(s): VITAMINB12, FOLATE, FERRITIN, TIBC, IRON, RETICCTPCT in the last 72 hours. Sepsis Labs: Recent Labs  Lab 09/21/19 1338  LATICACIDVEN 1.8    Recent Results (from the past 240 hour(s))  Culture, blood (routine x 2)     Status: None (Preliminary result)   Collection Time: 09/21/19  1:39 PM   Specimen: BLOOD LEFT ARM  Result Value Ref Range Status   Specimen Description   Final    BLOOD LEFT ARM Performed at [facility], [place] [address] [place], [place] [address]    Special Requests   Final    BOTTLES DRAWN AEROBIC AND ANAEROBIC Blood Culture adequate volume Performed at [facility] [address], [place], [place] [address]    Culture   Final    NO GROWTH 3 DAYS Performed at [facility], [place] [address], [place], [place] [address]    Report Status PENDING  Incomplete  SARS CORONAVIRUS 2 (TAT 6-24 HRS) Nasopharyngeal Nasopharyngeal Swab     Status: None   Collection Time: 09/21/19  3:00 PM   Specimen: Nasopharyngeal Swab  Result Value Ref Range Status   SARS Coronavirus 2 NEGATIVE NEGATIVE Final    Comment: (NOTE) SARS-CoV-2 target nucleic acids are NOT DETECTED. The SARS-CoV-2 RNA is generally detectable  in upper and lower respiratory specimens during the acute phase of infection. Negative results do not preclude SARS-CoV-2 infection, do not rule  out co-infections with other pathogens, and should not be used as the sole basis for treatment or other patient management decisions. Negative results must be combined with clinical observations, patient history, and epidemiological information. The expected result is Negative. Fact Sheet for Patients: SugarRoll.be Fact Sheet for Healthcare Providers: https://www.woods-mathews.com/ This test is not yet approved or cleared by the [place] FDA and  has been authorized for detection and/or diagnosis of SARS-CoV-2 by FDA under an Emergency Use Authorization (EUA). This EUA will remain  in effect (meaning this test can be used) for the duration of the COVID-19 declaration under Section 56 4(b)(1) of the Act, 21 U.S.C. section 360bbb-3(b)(1), unless the authorization is terminated or revoked sooner. Performed at [facility], [place] [address], [place], [place] [address]   Culture, blood (routine x 2)     Status: None (Preliminary result)   Collection Time: 09/21/19  5:52 PM   Specimen: Porta Cath; Blood  Result Value Ref Range Status   Specimen Description   Final    PORTA CATH Performed at [facility] [address] [place], [place] [address]    Special Requests   Final    BOTTLES DRAWN AEROBIC AND ANAEROBIC Blood Culture adequate volume Performed at [facility] [address], [place], [place] [address]    Culture   Final    NO GROWTH 3 DAYS Performed at [facility], [place] [address] [place], [place] [address]    Report Status PENDING  Incomplete    Radiology Studies: No results found.  Scheduled Meds: . amLODipine  10 mg Oral Daily  . atorvastatin  20 mg Oral q1800  . Chlorhexidine Gluconate Cloth  6 each Topical Daily  . cholecalciferol  2,000 Units Oral Daily  . dexamethasone  8 mg Intravenous Q24H  . feeding supplement  1 Container Oral TID BM  . fluticasone  2 spray Each  Nare Daily  . hydrALAZINE  50 mg Oral TID  . loratadine  10 mg Oral Daily  . metoprolol tartrate  50 mg Oral BID  . pantoprazole (PROTONIX) IV  40 mg Intravenous Q12H  . sodium bicarbonate  650 mg Oral BID  . sodium chloride flush  10-40 mL Intracatheter Q12H   Continuous Infusions: . dextrose 5 % with KCl 20 mEq / L 20 mEq (09/24/19 1020)  . ondansetron (ZOFRAN) IV      LOS: 3 days   Kerney Elbe, DO [facility] PAGER is on AMION  If 7PM-7AM, please contact night-coverage [email or web address]

## 2019-09-24 NOTE — Progress Notes (Signed)
PT Cancellation Note  Patient Details Name: [name] Foster MRN: [id number] DOB: [birth year]   Cancelled Treatment:    Reason Eval/Treat Not Completed: Medical issues which prohibited therapy Pt up on [facility] with nurse tech changing bed.  Pt feels unable to tolerate further mobility at this time due to nausea.   Tysheena Ginzburg,KATHrine E 09/24/2019, 12:12 PM Arlyce Dice, DPT Acute Rehabilitation Services Office: [phone]

## 2019-09-24 NOTE — Progress Notes (Addendum)
[facility] for warfarin Indication: VTE treatment  Allergies  Allergen Reactions  . Penicillins Rash    Did it involve swelling of the face/tongue/throat, SOB, or low BP? Unknown Did it involve sudden or severe rash/hives, skin peeling, or any reaction on the inside of your mouth or nose? Yes Did you need to seek medical attention at a hospital or doctor's office? Yes When did it last happen?in her 75s If all above answers are "NO", may proceed with cephalosporin use.   . Tetanus Toxoid Rash    Caused cellulitis   . Aspirin Rash   Patient Measurements: Height: 5\' 5"  (165.1 cm) Weight: 198 lb 3.1 oz (89.9 kg) IBW/kg (Calculated) : 57  Vital Signs: Temp: 98.2 F (36.8 C) (12/23 0542) Temp Source: Oral (12/23 0542) BP: 138/81 (12/23 1021) Pulse Rate: 73 (12/23 1021)  Labs: Recent Labs    09/22/19 0433 09/23/19 0351 09/24/19 0450  HGB 10.4* 9.6* 8.7*  HCT 35.5* 32.8* 30.0*  PLT 70* 52* 44*  LABPROT 15.5* 19.4* 27.2*  INR 1.2 1.6* 2.5*  CREATININE 2.57* 2.40* 2.23*   Estimated Creatinine Clearance: 27.1 mL/min (A) (by C-G formula based on SCr of 2.23 mg/dL (H)).  Medical History: Past Medical History:  Diagnosis Date  . Allergic rhinitis   . Aortic valve disorders   . Arthritis   . Asthma   . CHF (congestive heart failure) ([place])   . Coronary artery disease   . Family history of adverse reaction to anesthesia    difficulty waking mother up after surgery  . Family history of breast cancer   . Family history of cervical cancer   . Family history of colon cancer   . Family history of throat cancer   . Heart murmur   . Hyperlipidemia   . Hypertension   . Long term (current) use of anticoagulants   . Other primary cardiomyopathies   . Personal history of colonic polyps   . Personal history of venous thrombosis and embolism     Medications:  Scheduled:  . amLODipine  10 mg Oral Daily  . atorvastatin  20 mg Oral  q1800  . Chlorhexidine Gluconate Cloth  6 each Topical Daily  . cholecalciferol  2,000 Units Oral Daily  . feeding supplement  1 Container Oral TID BM  . fluticasone  2 spray Each Nare Daily  . hydrALAZINE  50 mg Oral TID  . loratadine  10 mg Oral Daily  . metoprolol tartrate  50 mg Oral BID  . sodium bicarbonate  650 mg Oral BID  . sodium chloride flush  10-40 mL Intracatheter Q12H   Assessment: Pharmacy consulted to dose/monitor warfarin in this 24 yoF on warfarin PTA for hx VTE (>10 years ago). PMH significant for breast cancer, currently undergoing chemotherapy (last dose 12/7). Pt admitted for intractable N/V, also found to have thrombocytopenia s/p chemotherapy.  Last dose of warfarin > 7 days prior to admit per med rec.  INR 1.1 on admission.  Home dose: warfarin 5mg  daily except 2.5 mg on Wednesdays   Today, 09/24/19  INR = 2.5 is therapeutic, but significantly increased from yesterday. Warfarin held on 12/22.  Hgb 8.7, trending down  Plt 44, low and trending down s/p chemotherapy  Ordered clear liquid diet, 0% of meal charted.   Albumin 2.6  No significant DDI  Goal of Therapy:  INR 2-3    Plan:   Hold warfarin this evening  Daily INR  Follow CBC  Lenis Noon PharmD 09/24/2019,2:01 PM

## 2019-09-24 NOTE — Progress Notes (Addendum)
HEMATOLOGY-ONCOLOGY PROGRESS NOTE  SUBJECTIVE: [name] Foster presented to the emergency room with nausea, vomiting, weakness, and fatigue.  4 days following her last chemotherapy, she started to have have nausea and vomiting without any relief from her home antiemetics.  Work-up in the emergency room showed dehydration with AKI.  She is currently receiving IV fluids and antiemetics.  When seen today, she reports persistent nausea.  Her nausea got worse this morning when she smelled the food that was brought on to the unit.  She has not vomited yet this morning but has had persistent, intermittent vomiting.  Reports that she is having at least 1 loose bowel movement per day.  Not really able to eat or drink secondary to the nausea and vomiting.  Still feels very weak overall.  Oncology History  Malignant neoplasm of overlapping sites of left breast in female, estrogen receptor negative ([place])  08/01/2019 Initial Diagnosis   Patient palpated left breast lump and skin thickening. Mammogram showed a 3.6cm mass at 3:30 position, a 0.9cm mass at 3:00 position, a 0.7cm mass at 2:00 position, a 0.5cm mass at 2:00 position, a 0.3cm mass at 1:00 position, and a 0.4cm mass at 1:00 position, with 4 abnormal left axillary lymph nodes. Biopsy showed IDC, grade 3, HER-2 + (3+), ER/PR -, Ki67 70%, in the breast and lymph nodes.    08/06/2019 Cancer Staging   Staging form: Breast, AJCC 8th Edition - Clinical: Stage IIIB (cT4, cN1, cM0, G3, ER-, PR-, HER2+) - Signed by Nicholas Lose, MD on 08/06/2019   08/19/2019 -  Chemotherapy   The patient had palonosetron (ALOXI) injection 0.25 mg, 0.25 mg, Intravenous,  Once, 2 of 6 cycles Administration: 0.25 mg (08/19/2019), 0.25 mg (09/08/2019) pegfilgrastim-cbqv (UDENYCA) injection 6 mg, 6 mg, Subcutaneous, Once, 2 of 6 cycles Administration: 6 mg (08/21/2019), 6 mg (09/10/2019) CARBOplatin (PARAPLATIN) 480 mg in sodium chloride 0.9 % 250 mL chemo infusion, 480 mg (110.9 % of  original dose 429.6 mg), Intravenous,  Once, 2 of 6 cycles Dose modification:   (original dose 429.6 mg, Cycle 1) Administration: 480 mg (08/19/2019), 340 mg (09/08/2019) DOCEtaxel (TAXOTERE) 160 mg in sodium chloride 0.9 % 250 mL chemo infusion, 75 mg/m2 = 160 mg, Intravenous,  Once, 2 of 6 cycles Dose modification: 60 mg/m2 (original dose 75 mg/m2, Cycle 2, Reason: Dose not tolerated) Administration: 160 mg (08/19/2019), 130 mg (09/08/2019) pertuzumab (PERJETA) 420 mg in sodium chloride 0.9 % 250 mL chemo infusion, 420 mg (100 % of original dose 420 mg), Intravenous, Once, 2 of 6 cycles Dose modification: 420 mg (original dose 420 mg, Cycle 1, Reason: Provider Judgment) Administration: 420 mg (08/19/2019), 420 mg (09/08/2019) fosaprepitant (EMEND) 150 mg, dexamethasone (DECADRON) 12 mg in sodium chloride 0.9 % 145 mL IVPB, , Intravenous,  Once, 2 of 6 cycles Administration:  (08/19/2019),  (09/08/2019) trastuzumab-anns (KANJINTI) 750 mg in sodium chloride 0.9 % 250 mL chemo infusion, 756 mg (100 % of original dose 8 mg/kg), Intravenous,  Once, 2 of 6 cycles Dose modification: 8 mg/kg (original dose 8 mg/kg, Cycle 1, Reason: Other (see comments), Comment: change to approved brand), 6 mg/kg (original dose 6 mg/kg, Cycle 2, Reason: Other (see comments), Comment: brand change per insurance) Administration: 750 mg (08/19/2019), 567 mg (09/08/2019)  for chemotherapy treatment.       REVIEW OF SYSTEMS:   Constitutional: Denies fevers, chills  Eyes: Denies blurriness of vision Ears, nose, mouth, throat, and face: Denies mucositis or sore throat Respiratory: Denies cough, dyspnea or wheezes Cardiovascular:  Denies palpitation, chest discomfort Gastrointestinal: Denies abdominal pain.  Reports nausea and vomiting.  Having at least 1 loose stool per day. Skin: Denies abnormal skin rashes Lymphatics: Denies new lymphadenopathy or easy bruising Neurological:Denies numbness, tingling or new  weaknesses Behavioral/Psych: Mood is stable, no new changes  Extremities: No lower extremity edema All other systems were reviewed with the patient and are negative.  I have reviewed the past medical history, past surgical history, social history and family history with the patient and they are unchanged from previous note.   PHYSICAL EXAMINATION: ECOG PERFORMANCE STATUS: 1 - Symptomatic but completely ambulatory  Vitals:   09/23/19 1958 09/24/19 0542  BP: 122/75 135/70  Pulse: 79 63  Resp: 17 18  Temp: 98.4 F (36.9 C) 98.2 F (36.8 C)  SpO2: 98% 100%   Filed Weights   09/22/19 0556 09/23/19 0550 09/24/19 0850  Weight: 171 lb 4.8 oz (77.7 kg) 177 lb 4 oz (80.4 kg) 198 lb 3.1 oz (89.9 kg)    Intake/Output from previous day: 12/22 0701 - 12/23 0700 In: 3980.6 [P.O.:300; I.V.:3680.6] Out: -   GENERAL:alert, no distress, appears tired SKIN: skin color, texture, turgor are normal, no rashes or significant lesions EYES: normal, Conjunctiva are pink and non-injected, sclera clear OROPHARYNX:no exudate, no erythema and lips, buccal mucosa, and tongue normal  NECK: supple, thyroid normal size, non-tender, without nodularity LYMPH:  no palpable lymphadenopathy in the cervical, axillary or inguinal LUNGS: clear to auscultation and percussion with normal breathing effort HEART: regular rate & rhythm and no murmurs and no lower extremity edema ABDOMEN:abdomen soft, non-tender and normal bowel sounds Musculoskeletal:no cyanosis of digits and no clubbing  NEURO: alert & oriented x 3 with fluent speech, no focal motor/sensory deficits  LABORATORY DATA:  I have reviewed the data as listed CMP Latest Ref Rng & Units 09/24/2019 09/23/2019 09/22/2019  Glucose 70 - 99 mg/dL 129(H) 153(H) 182(H)  BUN 8 - 23 mg/dL 24(H) 34(H) 46(H)  Creatinine 0.44 - 1.00 mg/dL 2.23(H) 2.40(H) 2.57(H)  Sodium 135 - 145 mmol/L 151(H) 155(H) 156(H)  Potassium 3.5 - 5.1 mmol/L 3.4(L) 3.3(L) 3.3(L)  Chloride  98 - 111 mmol/L 125(H) 125(H) 123(H)  CO2 22 - 32 mmol/L 21(L) 23 23  Calcium 8.9 - 10.3 mg/dL 8.1(L) 8.3(L) 8.5(L)  Total Protein 6.5 - 8.1 g/dL 5.0(L) 5.2(L) 5.7(L)  Total Bilirubin 0.3 - 1.2 mg/dL 0.3 0.8 0.5  Alkaline Phos 38 - 126 U/L 100 118 148(H)  AST 15 - 41 U/L _0 ALT 0 - 44 U/L _1 Lab Results  Component Value Date   WBC 13.8 (H) 09/24/2019   HGB 8.7 (L) 09/24/2019   HCT 30.0 (L) 09/24/2019   MCV 98.7 09/24/2019   PLT 44 (L) 09/24/2019   NEUTROABS 18.1 (H) 09/22/2019    CT ABDOMEN PELVIS WO CONTRAST  Result Date: 09/21/2019 CLINICAL DATA:  Weakness and nausea and vomiting for 1 week. Undergoing chemotherapy for breast carcinoma. EXAM: CT ABDOMEN AND PELVIS WITHOUT CONTRAST TECHNIQUE: Multidetector CT imaging of the abdomen and pelvis was performed following the standard protocol without IV contrast. COMPARISON:  08/12/2019 FINDINGS: Lower chest: No acute findings. Hepatobiliary: No mass visualized on this unenhanced exam. Focal fatty infiltration again noted adjacent to the falciform ligament. Gallbladder is unremarkable. No evidence of biliary ductal dilatation. Pancreas: No mass or inflammatory process visualized on this unenhanced exam. Spleen:  Within normal limits in size. Adrenals/Urinary tract: No evidence of urolithiasis or hydronephrosis. A tiny  subcapsular lesion is again seen in the posterior upper pole the left kidney which demonstrate contrast enhancement on prior exam, suspicious for small renal cell carcinoma. Unremarkable unopacified urinary bladder. Stomach/Bowel: No evidence of obstruction, inflammatory process, or abnormal fluid collections. Normal appendix visualized. Vascular/Lymphatic: No pathologically enlarged lymph nodes identified. No evidence of abdominal aortic aneurysm. Aortic atherosclerosis incidentally noted. Reproductive: Prior hysterectomy noted. Adnexal regions are unremarkable in appearance. Other:  None. Musculoskeletal: No  suspicious bone lesions identified. Stable mild compression fracture deformity of the L2 vertebral body. IMPRESSION: No acute findings. Tiny subcapsular lesion in posterior upper pole of left kidney which showed contrast enhancement on prior exam, suspicious for renal cell carcinoma. Electronically Signed   By: Marlaine Hind M.D.   On: 09/21/2019 16:48   DG CHEST PORT 1 VIEW  Result Date: 09/21/2019 CLINICAL DATA:  Generalized weakness EXAM: PORTABLE CHEST 1 VIEW COMPARISON:  August 18, 2019 FINDINGS: The heart size and mediastinal contours are within normal limits. A left-sided MediPort catheter seen with the tip at the superior SVC. Aortic knob calcifications. Both lungs are clear. The visualized skeletal structures are unremarkable. IMPRESSION: No active disease. Electronically Signed   By: Prudencio Pair M.D.   On: 09/21/2019 16:10    ASSESSMENT AND PLAN: 1.  Left breast cancer, ER/PR negative, HER-2 positive 2.  Nausea, vomiting, diarrhea likely due to recent chemotherapy 3.  AKI due to dehydration, slowly improving 4.  Leukocytosis secondary to recent Neulasta 5.  Anemia and thrombocytopenia due to recent chemotherapy 6.  Hypernatremia, hypokalemia 7.  History of DVT  -The patient continues to have persistent nausea and vomiting likely related to her recent chemotherapy.  She currently has as needed Phenergan and Compazine ordered.  I would recommend adding Zofran p.o. or IV and change lorazepam 0.5 mg p.o. or sublingual every 6 hours for nausea and vomiting. -Renal function slowly improving with IV hydration.  Continue IV fluids and monitor renal function closely. -Leukocytosis is due to recent Neulasta.  She is afebrile and she has no signs of acute infection.  White blood cell count is trending downward.  Monitor. -Hemoglobin and platelets are trending downward slowly.  Recommend PRBC transfusion for hemoglobin less than 8 and transfuse platelets for platelet count less than 20,000. -We  will defer management of her electrolyte abnormalities to hospitalist. -Warfarin currently on hold   LOS: 3 days   Mikey Bussing, DNP, AGPCNP-BC, AOCNP 09/24/19  Attending Note  I personally saw the patient, reviewed the chart and examined the patient.  agree with the assessment and plan as documented above. Thank you very much for the consultation. - Intractable Nausea and vomiting due to chemo:  Plan:  1. Zofran 8 mg IV TID scheduled 2. Lorazepam: Scheduled 3. Phenergan and compazine PRN 4. Decadron 8 mg IV daily X 24-48 hours (please D/C if nausea subsides) If no improvement by tomorrow, will add Olanzepine

## 2019-09-24 NOTE — Care Management Important Message (Signed)
Important Message  Patient Details IM Letter given to Roque Lias SW Case Manager to present to the Patient Name: France Lusty MRN: [id number] Date of Birth: 1951/07/03   Medicare Important Message Given:  Yes     Kerin Salen 09/24/2019, 10:08 AM

## 2019-09-25 LAB — COMPREHENSIVE METABOLIC PANEL
ALT: 22 U/L (ref 0–44)
AST: 21 U/L (ref 15–41)
Albumin: 2.6 g/dL — ABNORMAL LOW (ref 3.5–5.0)
Alkaline Phosphatase: 99 U/L (ref 38–126)
Anion gap: 7 (ref 5–15)
BUN: 20 mg/dL (ref 8–23)
CO2: 20 mmol/L — ABNORMAL LOW (ref 22–32)
Calcium: 8.2 mg/dL — ABNORMAL LOW (ref 8.9–10.3)
Chloride: 117 mmol/L — ABNORMAL HIGH (ref 98–111)
Creatinine, Ser: 2.1 mg/dL — ABNORMAL HIGH (ref 0.44–1.00)
GFR calc Af Amer: 28 mL/min — ABNORMAL LOW (ref >60)
GFR calc non Af Amer: 24 mL/min — ABNORMAL LOW (ref >60)
Glucose, Bld: 184 mg/dL — ABNORMAL HIGH (ref 70–99)
Potassium: 4.7 mmol/L (ref 3.5–5.1)
Sodium: 144 mmol/L (ref 135–145)
Total Bilirubin: 0.2 mg/dL — ABNORMAL LOW (ref 0.3–1.2)
Total Protein: 4.9 g/dL — ABNORMAL LOW (ref 6.5–8.1)

## 2019-09-25 LAB — MAGNESIUM: Magnesium: 1.9 mg/dL (ref 1.7–2.4)

## 2019-09-25 LAB — CBC
HCT: 27.9 % — ABNORMAL LOW (ref 36.0–46.0)
Hemoglobin: 8.5 g/dL — ABNORMAL LOW (ref 12.0–15.0)
MCH: 29.6 pg (ref 26.0–34.0)
MCHC: 30.5 g/dL (ref 30.0–36.0)
MCV: 97.2 fL (ref 80.0–100.0)
Platelets: 44 10*3/uL — ABNORMAL LOW (ref 150–400)
RBC: 2.87 MIL/uL — ABNORMAL LOW (ref 3.87–5.11)
RDW: 15.9 % — ABNORMAL HIGH (ref 11.5–15.5)
WBC: 14.7 10*3/uL — ABNORMAL HIGH (ref 4.0–10.5)
nRBC: 0 % (ref 0.0–0.2)

## 2019-09-25 LAB — GLUCOSE, CAPILLARY: Glucose-Capillary: 154 mg/dL — ABNORMAL HIGH (ref 70–99)

## 2019-09-25 LAB — PHOSPHORUS: Phosphorus: 2.4 mg/dL — ABNORMAL LOW (ref 2.5–4.6)

## 2019-09-25 LAB — PROTIME-INR
INR: 2.4 — ABNORMAL HIGH (ref 0.8–1.2)
Prothrombin Time: 26.3 seconds — ABNORMAL HIGH (ref 11.4–15.2)

## 2019-09-25 MED ORDER — MAGIC MOUTHWASH
5.0000 mL | Freq: Three times a day (TID) | ORAL | Status: DC
  Administered 2019-09-25 – 2019-10-03 (×19): 5 mL via ORAL
  Filled 2019-09-25 (×27): qty 5

## 2019-09-25 MED ORDER — POTASSIUM CL IN DEXTROSE 5% 20 MEQ/L IV SOLN
20.0000 meq | INTRAVENOUS | Status: DC
  Administered 2019-09-25 – 2019-09-29 (×5): 20 meq via INTRAVENOUS
  Filled 2019-09-25 (×5): qty 1000

## 2019-09-25 NOTE — Progress Notes (Signed)
HEMATOLOGY-ONCOLOGY PROGRESS NOTE  SUBJECTIVE: Intractable nausea and vomiting. She is getting around-the-clock Zofran which has significantly helped her nausea. However her oral intake is still very poor.  She is only taking sips of boost at this time.  She is thinking about trying something more substantial later today.  Oncology History  Malignant neoplasm of overlapping sites of left breast in female, estrogen receptor negative ([place])  08/01/2019 Initial Diagnosis   Patient palpated left breast lump and skin thickening. Mammogram showed a 3.6cm mass at 3:30 position, a 0.9cm mass at 3:00 position, a 0.7cm mass at 2:00 position, a 0.5cm mass at 2:00 position, a 0.3cm mass at 1:00 position, and a 0.4cm mass at 1:00 position, with 4 abnormal left axillary lymph nodes. Biopsy showed IDC, grade 3, HER-2 + (3+), ER/PR -, Ki67 70%, in the breast and lymph nodes.    08/06/2019 Cancer Staging   Staging form: Breast, AJCC 8th Edition - Clinical: Stage IIIB (cT4, cN1, cM0, G3, ER-, PR-, HER2+) - Signed by Nicholas Lose, MD on 08/06/2019   08/19/2019 -  Chemotherapy   The patient had palonosetron (ALOXI) injection 0.25 mg, 0.25 mg, Intravenous,  Once, 2 of 6 cycles Administration: 0.25 mg (08/19/2019), 0.25 mg (09/08/2019) pegfilgrastim-cbqv (UDENYCA) injection 6 mg, 6 mg, Subcutaneous, Once, 2 of 6 cycles Administration: 6 mg (08/21/2019), 6 mg (09/10/2019) CARBOplatin (PARAPLATIN) 480 mg in sodium chloride 0.9 % 250 mL chemo infusion, 480 mg (110.9 % of original dose 429.6 mg), Intravenous,  Once, 2 of 6 cycles Dose modification:   (original dose 429.6 mg, Cycle 1) Administration: 480 mg (08/19/2019), 340 mg (09/08/2019) DOCEtaxel (TAXOTERE) 160 mg in sodium chloride 0.9 % 250 mL chemo infusion, 75 mg/m2 = 160 mg, Intravenous,  Once, 2 of 6 cycles Dose modification: 60 mg/m2 (original dose 75 mg/m2, Cycle 2, Reason: Dose not tolerated) Administration: 160 mg (08/19/2019), 130 mg (09/08/2019) pertuzumab  (PERJETA) 420 mg in sodium chloride 0.9 % 250 mL chemo infusion, 420 mg (100 % of original dose 420 mg), Intravenous, Once, 2 of 6 cycles Dose modification: 420 mg (original dose 420 mg, Cycle 1, Reason: Provider Judgment) Administration: 420 mg (08/19/2019), 420 mg (09/08/2019) fosaprepitant (EMEND) 150 mg, dexamethasone (DECADRON) 12 mg in sodium chloride 0.9 % 145 mL IVPB, , Intravenous,  Once, 2 of 6 cycles Administration:  (08/19/2019),  (09/08/2019) trastuzumab-anns (KANJINTI) 750 mg in sodium chloride 0.9 % 250 mL chemo infusion, 756 mg (100 % of original dose 8 mg/kg), Intravenous,  Once, 2 of 6 cycles Dose modification: 8 mg/kg (original dose 8 mg/kg, Cycle 1, Reason: Other (see comments), Comment: change to approved brand), 6 mg/kg (original dose 6 mg/kg, Cycle 2, Reason: Other (see comments), Comment: brand change per insurance) Administration: 750 mg (08/19/2019), 567 mg (09/08/2019)  for chemotherapy treatment.      OBJECTIVE: REVIEW OF SYSTEMS:   Constitutional: Denies fevers, chills or abnormal weight loss Eyes: Denies blurriness of vision Ears, nose, mouth, throat, and face: Denies mucositis or sore throat Respiratory: Denies cough, dyspnea or wheezes Cardiovascular: Denies palpitation, chest discomfort Gastrointestinal: Severe nausea and vomiting improvement Skin: Denies abnormal skin rashes Lymphatics: Denies new lymphadenopathy or easy bruising Neurological:Denies numbness, tingling or new weaknesses Behavioral/Psych: Mood is stable, no new changes  Extremities: No lower extremity edema  All other systems were reviewed with the patient and are negative.  I have reviewed the past medical history, past surgical history, social history and family history with the patient and they are unchanged from previous note.  PHYSICAL EXAMINATION: ECOG PERFORMANCE STATUS: 3 - Symptomatic, >50% confined to bed  Vitals:   09/25/19 0652 09/25/19 0942  BP: 133/75 132/75  Pulse: 70  63  Resp: 18   Temp: 98.5 F (36.9 C)   SpO2: 100%    Filed Weights   09/23/19 0550 09/24/19 0850 09/25/19 0802  Weight: 177 lb 4 oz (80.4 kg) 198 lb 3.1 oz (89.9 kg) 200 lb 2.8 oz (90.8 kg)    GENERAL:alert, no distress and comfortable SKIN: skin color, texture, turgor are normal, no rashes or significant lesions EYES: normal, Conjunctiva are pink and non-injected, sclera clear OROPHARYNX:no exudate, no erythema and lips, buccal mucosa, and tongue normal  NECK: supple, thyroid normal size, non-tender, without nodularity LYMPH:  no palpable lymphadenopathy in the cervical, axillary or inguinal LUNGS: clear to auscultation and percussion with normal breathing effort HEART: regular rate & rhythm and no murmurs and no lower extremity edema ABDOMEN: mild abdominal tenderness Musculoskeletal:no cyanosis of digits and no clubbing  NEURO: alert & oriented x 3 with fluent speech, no focal motor/sensory deficits  LABORATORY DATA:  I have reviewed the data as listed CMP Latest Ref Rng & Units 09/25/2019 09/24/2019 09/23/2019  Glucose 70 - 99 mg/dL 184(H) 129(H) 153(H)  BUN 8 - 23 mg/dL 20 24(H) 34(H)  Creatinine 0.44 - 1.00 mg/dL 2.10(H) 2.23(H) 2.40(H)  Sodium 135 - 145 mmol/L 144 151(H) 155(H)  Potassium 3.5 - 5.1 mmol/L 4.7 3.4(L) 3.3(L)  Chloride 98 - 111 mmol/L 117(H) 125(H) 125(H)  CO2 22 - 32 mmol/L 20(L) 21(L) 23  Calcium 8.9 - 10.3 mg/dL 8.2(L) 8.1(L) 8.3(L)  Total Protein 6.5 - 8.1 g/dL 4.9(L) 5.0(L) 5.2(L)  Total Bilirubin 0.3 - 1.2 mg/dL 0.2(L) 0.3 0.8  Alkaline Phos 38 - 126 U/L 99 100 118  AST 15 - 41 U/L '21 22 23  ' ALT 0 - 44 U/L '22 20 19    ' Lab Results  Component Value Date   WBC 14.7 (H) 09/25/2019   HGB 8.5 (L) 09/25/2019   HCT 27.9 (L) 09/25/2019   MCV 97.2 09/25/2019   PLT 44 (L) 09/25/2019   NEUTROABS 18.1 (H) 09/22/2019    ASSESSMENT AND PLAN: 1.  Intractable nausea vomiting due to chemotherapy: She is getting around-the-clock Zofran which appears to be  helping her.  She has as needed Compazine and Phenergan.  We also started on the clock dexamethasone. Hopefully she gets appetite so that she can start taking more substantial nutrition.  I instructed her that she needs to get up and sit in the chair and try to walk around a little bit to help gravity improved her symptoms.  2.  Renal insufficiency: Continuing to improve slowly.  Creatinine today is 2.1. She will still need lots of IV fluids.  3.  Thrombocytopenia: Due to prior chemotherapy Normocytic anemia hemoglobin 8.5  I hope she will get better in the next  48 hours but she can go home. Please do not hesitate to call the on-call oncologist if there are any questions or concerns. I will see her next week for follow-up.

## 2019-09-25 NOTE — Progress Notes (Signed)
PT Cancellation Note  Patient Details Name: [name] Foster MRN: [id number] DOB: 1951/01/10   Cancelled Treatment:    Reason Eval/Treat Not Completed: Other (comment) Pt reports finally feeling somewhat comfortable from nausea and movement provokes nausea so politely declined PT at this time.   Lissandra Keil,KATHrine E 09/25/2019, 3:48 PM Jannette Spanner PT, DPT Acute Rehabilitation Services Office: [phone]

## 2019-09-25 NOTE — Progress Notes (Signed)
PROGRESS NOTE    [name] Foster  JOI:[id number] DOB: 06-Dec-1950 DOA: 09/21/2019 PCP: Burnis Medin, MD   Brief Narrative:  [name] Foster is a 68 y.o. female with a PMH significant for but not limited too allergic rhinitis and asthma, history of CHF and coronary artery disease, hypertension, hyperlipidemia, myopathy, history of DVT on Coumadin, history of left-sided breast cancer currently on chemotherapy seeing Dr. Nicholas Lose, as well as other comorbidities who presents with a chief complaint of nausea vomiting as well as generalized weakness and fatigue.  Of note patient recently had chemotherapy on 09/08/2019 and had Neulasta on 09/09/2008.  Patient states that about 4 days after her chemotherapy she started feeling sick and had nausea and vomiting and has had nausea vomiting for last 10 days without relief of her antiemetics that she was given at home.  Denies any abdominal pain or cramping and states that she has not vomited today but was having some dry heaves.  Felt significantly weaker and states that she has not had her medication since Thursday because she kept throwing them up.  Denies any chest pain or shortness of breath.  Denies any burning or discomfort in her urine and denies any other complaints or concerns at this time.  TRH was asked to admit this patient for intractable nausea and vomiting and dehydration with significant AKI found on blood work.  ED Course: In the ED patient had basic blood work done and was found to be hypernatremic and hyperchloremic along with a significant AKI.  She is also found to have a leukocytosis and thrombocytopenia and a lactic acid level done and normal.  Of note SARS-CoV-2 point-of-care testing was negative but 64-hour PCR turnaround time is still pending and she will be kept as a patient under investigation until the results given her symptomatology.  **Interim History Continued to have persistent nausea and vomiting.  The smell of food made her  nauseous yesterday morning.  Renal function slowly trending down as well as her hyponatremia.  Oncology trying several antiemetics.  I will schedule PPI for the patient.  If not improving may need a formal gastroenterology evaluation.  Her nausea is improving but her oral intake is very minimal and we need to ensure that she can orally hydrate and take in proper nutrition prior to discharge.  Patient declined work with PT given that she was afraid to be nauseous.  Assessment & Plan:   Active Problems:   HLD (hyperlipidemia)   Essential hypertension   Hypertensive heart disease with heart failure (HCC)   Congestive heart failure (HCC)   DVT, HX OF   Aortic insufficiency   Asthma with bronchitis   Long term (current) use of anticoagulants   Malignant neoplasm of overlapping sites of left breast in female, estrogen receptor negative ([place])   Port-A-Cath in place   Intractable nausea and vomiting   Generalized weakness   Acute on chronic kidney failure (HCC)   Leukocytosis   Hypernatremia   Hyperchloremia   Thrombocytopenia (HCC)  Intractable Nausea/Vomiting with Unclear Etiology; persistent and nausea improving but she is still very poor p.o. intake Poor p.o. intake -Recently had Chemotherapy on 09/08/2019 and started having Nausea and Vomiting 4 days after Treatment and Had Neulasta on 12/9 -Suspect Related to Chemotherapy  -Check CT Abdomen and Pelvis w/o Contrast and showed "No acute findings. Tiny subcapsular lesion in posterior upper pole of left kidney which showed contrast enhancement on prior exam, suspicious for renal cell carcinoma." -Place on CLD -C/w  Supportive Care and Antiemetics but will need judicious usage given prolonged QTC. -She is on several antiemetics now and is on ondansetron 8 mg IV every 8 hours scheduled with 8 mg p.o. every 8 as needed for nausea vomiting along with promethazine 12.5 mg IV every 6 as needed for refractory nausea vomiting as well as 10 mg of  Compazine every 6 hours as needed for nausea vomiting; medical oncology had now started her on dexamethasone 8 mg daily -Continued IVF Hydration with D5 1/2 NS at 75 mL/hr  but this has now been changed to just D5W with 20 mEq of KCl at 75 mL's per hour given her continued Hypernatremia with a reduced rate of 50 MLS per hour now; hyponatremia is now improved and sodium is 144 and potassium is 4.7 -We will add scheduled IV PPI twice daily 40 mEq at the recommendations of Dr. Ardis Hughs.  I spoke with him today about the patient's case and he states that this is not improving the patient that we will need to call for a formal consult in the a.m. -Medical oncology is following and they have recommended the above antiemetics -Edema feels that if there is no improvement by tomorrow we will add olanzapine -She is also on lorazepam 0.5 mg p.o./sublingual for nausea vomiting every 6 hours for as needed -States her nausea is improved however still taking in very minimal p.o. intake  Generalized Weakness in the Setting of Above -C/w Supportive Treatment and IVF  change as above -Get PT/OT to evaluate and Treat and they are recommending HHPT/OT at time of D/C  AKI on CKD Stage 3 Metabolic Acidosis; In the setting of N/V and Hyperchloremia  -In the Setting of Nausea and Vomiting  -Patient's BUN/Cr went from 22/1.88 -> 57/3.13 -> 46/2.57 -> 34/2.40 -> 24/2.23 -> 20/2.10 -Patient's CO2 was 20, AG was 7 and Chloride Level was 117 -Added Sodium Bicarbonate 650 mg po BID and will continue -Changed IVF as above and have reduced to 50 mL/h; Still appears dehydrated somewhat  -Avoid Nephrotoxic Medications, Contrast Dyes, Hypotension if possible and Renally Dose Medications -Repeat CMP in AM   Hypernatremia/Hyperchloremia, improving -In the setting of Dehydration -Given 2 Liters of NS in the ED -Give D5 1/2 NS at 75 mL/hr but changed to D5W + KCl 20 mEQ for yesterday and likely will stop tomorrow and avoidance of  significant volume overload -Continue to Monitor and Trend -Repeat CMP in AM   Leukocytosis -Patient's WBC had gone from 9.4 -> 24.8 -> 22.6 -> 17.3 -> 13.8 -In the Setting of Dehydration from Nausea and Vomiting but Did have Neulasta Recently as well as Dexamethasone post Chemotherapy  -Pan-Cx Patient and Check Blood Cx x2, Urinalysis, and Urine Cx -CXR on Admission not Done so will order one now and will also order CT Abd/Pelvis w/o Contrast due to Nausea and Vomiting  -Of Note SARS-CoV-2 Testing Negative  -Hold of Abx at this point until Cx's are obtained; Currently Afebrile and LA was 1.8 -Continue to Monitor and Trend WBC -Repeat CBC in AM   Thrombocytopenia, worsening  -Patient's Platelet Count went from 169 on 12/7 -> 93  -> 70 -> 52 -> 44 -> 44 -Oncology recommend Transfusion for Platelet Count -Continue to Monitor for S/Sx of Bleeding as she was Anticoagulated on Coumadin; Currently no overt bleeding noted and Coumadin is held  -Repeat CBC in AM   Left Breast Cancer/Malignant Neoplasm overlapping sites of the Left Breast -Sees Dr. Lindi Adie in the outpatient  Setting  -Appreciate Dr. Geralyn Flash evaluation   HTN/Hypertensive Heart Disease  -C/w Amlodipine 10 mg po Daily in AM and Hydralazine 50 mg po TID -C/w Metoprolol Tartrate 50 mg po BID  -Hold Ramipril and Furosemide for now given AKI   Chronic Diastolic CHF Cardiomyopathy and Hx of Aortic Insufficieny  -Hold ACE and Furosemide  -Strict I's and O's and Daily Weights; She is +6962 Liters since Admission but clinically still appears somewhat dehydrated and she is not orally hydrating or taking any food so we will continue IV fluids gently with 50 mils per hour as above -Weight has gone up from 171- > 198 however on admission she was 192 -Continued Coumadin per Pharmacy initially; Now stopped  -Continue to Monitor for S/Sx of Volume overload   Hyppokalemia -Patient's K+ is 4.7 -Mag Level not checked today  -Continue  to Monitor and Replete as Necessary; has 20 mEq of KCl on her IV fluids -Repeat CMP in AM   HLD -C/w Atorvastain 20 mg po daily if able to take po   Hx of DVT -Holding Coumadin now per Oncology until Platelet Count >100,000  CAD -C/w Atorvastatin 20 mg po Daily -C/w Telemetry -Currently not complaining of any CP  Hx of Asthma with Bronchitis -C/w Albuterol Inhaler 2 puff IH q6hprn Wheezing and SOB  Prolonged QT -QTc was 595 but now repeated  -C/w IVF Hydration as Above -C/w Telemetry Monitoring -Repeat EKG in AM -Check Mag and Phos  -Avoid QT prolonging agents if possible  Obesity -Estimated body mass index is 33.31 kg/m as calculated from the following:   Height as of this encounter: 5\' 5"  (1.651 m).   Weight as of this encounter: 90.8 kg. -Weight Loss and Dietary Counseling given   DVT prophylaxis: SCDs as her platelet count is low and has thrombocytopenia Code Status: FULL CODE  Family Communication: No family present at bedside  Disposition Plan: Pending further clinical improvement and workup   Consultants:   Medical Oncology  Discussed case with GI    Procedures: None   Antimicrobials:  Anti-infectives (From admission, onward)   None     Subjective: Seen and examined at bedside and states that her nausea a little bit better today but still has not been taking very much p.o. intake.  Denies any lightheadedness or dizziness but still feels weak.  No other concerns or complaints at this time.  Objective: Vitals:   09/25/19 0802 09/25/19 0942 09/25/19 1444 09/25/19 1617  BP:  132/75 131/84 130/70  Pulse:  63 71 62  Resp:   18 16  Temp:   98.4 F (36.9 C) 98.4 F (36.9 C)  TempSrc:   Oral Oral  SpO2:   98% 99%  Weight: 90.8 kg     Height:        Intake/Output Summary (Last 24 hours) at 09/25/2019 1946 Last data filed at 09/25/2019 1445 Gross per 24 hour  Intake 2288.96 ml  Output --  Net 2288.96 ml   Filed Weights   09/23/19 0550  09/24/19 0850 09/25/19 0802  Weight: 80.4 kg 89.9 kg 90.8 kg   Examination: Physical Exam:  Constitutional: WN/WD obese African-American female currently appears calmer and not as much distress as she was yesterday and but does still appear a little uncomfortable Eyes:  Lids and conjunctivae normal, sclerae anicteric  ENMT: External Ears, Nose appear normal. Grossly normal hearing.  Has some mild ulcerations and peeling in her mouth Neck: Appears normal, supple, no cervical masses, normal ROM,  no appreciable thyromegaly; no JVD Respiratory: Diminished to auscultation bilaterally with coarse breath sounds, no wheezing, rales, rhonchi or crackles. Normal respiratory effort and patient is not tachypenic. No accessory muscle use.  Unlabored breathing Cardiovascular: RRR, no murmurs / rubs / gallops. S1 and S2 auscultated.  Very mild extremity edema.  Abdomen: Soft, non-tender, Distended. Bowel sounds positive x4.  GU: Deferred. Musculoskeletal: No clubbing / cyanosis of digits/nails. No joint deformity upper and lower extremities. .  Skin: No rashes, lesions, ulcers on limited skin evaluation. No induration; Warm and dry.  Neurologic: CN 2-12 grossly intact with no focal deficits. Romberg sign and cerebellar reflexes not assessed.  Psychiatric: Normal judgment and insight. Alert and oriented x 3.  Not as anxious; pleasant mood and appropriate affect.   Data Reviewed: I have personally reviewed following labs and imaging studies  CBC: Recent Labs  Lab 09/21/19 1336 09/22/19 0433 09/23/19 0351 09/24/19 0450 09/25/19 0426  WBC 24.8* 22.6* 17.3* 13.8* 14.7*  NEUTROABS 20.5* 18.1*  --   --   --   HGB 12.4 10.4* 9.6* 8.7* 8.5*  HCT 41.7 35.5* 32.8* 30.0* 27.9*  MCV 98.6 98.1 98.8 98.7 97.2  PLT 93* 70* 52* 44* 44*   Basic Metabolic Panel: Recent Labs  Lab 09/21/19 1336 09/21/19 1752 09/22/19 0433 09/23/19 0351 09/24/19 0450 09/25/19 0426  NA 157*  --  156* 155* 151* 144  K 3.9  --   3.3* 3.3* 3.4* 4.7  CL 118*  --  123* 125* 125* 117*  CO2 22  --  23 23 21* 20*  GLUCOSE 107*  --  182* 153* 129* 184*  BUN 57*  --  46* 34* 24* 20  CREATININE 3.13*  --  2.57* 2.40* 2.23* 2.10*  CALCIUM 9.1  --  8.5* 8.3* 8.1* 8.2*  MG  --  2.3 2.4  --   --  1.9  PHOS  --  3.4 2.8  --   --  2.4*   GFR: Estimated Creatinine Clearance: 28.9 mL/min (A) (by C-G formula based on SCr of 2.1 mg/dL (H)). Liver Function Tests: Recent Labs  Lab 09/21/19 1336 09/22/19 0433 09/23/19 0351 09/24/19 0450 09/25/19 0426  AST 36 30 23 22 21   ALT 23 20 19 20 22   ALKPHOS 154* 148* 118 100 99  BILITOT 0.7 0.5 0.8 0.3 0.2*  PROT 6.6 5.7* 5.2* 5.0* 4.9*  ALBUMIN 3.6 3.2* 3.0* 2.6* 2.6*   No results for input(s): LIPASE, AMYLASE in the last 168 hours. No results for input(s): AMMONIA in the last 168 hours. Coagulation Profile: Recent Labs  Lab 09/21/19 1801 09/22/19 0433 09/23/19 0351 09/24/19 0450 09/25/19 0426  INR 1.1 1.2 1.6* 2.5* 2.4*   Cardiac Enzymes: No results for input(s): CKTOTAL, CKMB, CKMBINDEX, TROPONINI in the last 168 hours. BNP (last 3 results) No results for input(s): PROBNP in the last 8760 hours. HbA1C: No results for input(s): HGBA1C in the last 72 hours. CBG: Recent Labs  Lab 09/22/19 0758 09/23/19 0729 09/24/19 0829 09/25/19 0728  GLUCAP 136* 142* 107* 154*   Lipid Profile: No results for input(s): CHOL, HDL, LDLCALC, TRIG, CHOLHDL, LDLDIRECT in the last 72 hours. Thyroid Function Tests: No results for input(s): TSH, T4TOTAL, FREET4, T3FREE, THYROIDAB in the last 72 hours. Anemia Panel: No results for input(s): VITAMINB12, FOLATE, FERRITIN, TIBC, IRON, RETICCTPCT in the last 72 hours. Sepsis Labs: Recent Labs  Lab 09/21/19 1338  LATICACIDVEN 1.8    Recent Results (from the past 240 hour(s))  Culture, blood (  routine x 2)     Status: None (Preliminary result)   Collection Time: 09/21/19  1:39 PM   Specimen: BLOOD LEFT ARM  Result Value Ref Range  Status   Specimen Description   Final    BLOOD LEFT ARM Performed at [facility], [place] [address] [place], [place] [address]    Special Requests   Final    BOTTLES DRAWN AEROBIC AND ANAEROBIC Blood Culture adequate volume Performed at [place] [address], [place], [place] [address]    Culture   Final    NO GROWTH 4 DAYS Performed at [facility], [place] [address], [place], [place] [address]    Report Status PENDING  Incomplete  SARS CORONAVIRUS 2 (TAT 6-24 HRS) Nasopharyngeal Nasopharyngeal Swab     Status: None   Collection Time: 09/21/19  3:00 PM   Specimen: Nasopharyngeal Swab  Result Value Ref Range Status   SARS Coronavirus 2 NEGATIVE NEGATIVE Final    Comment: (NOTE) SARS-CoV-2 target nucleic acids are NOT DETECTED. The SARS-CoV-2 RNA is generally detectable in upper and lower respiratory specimens during the acute phase of infection. Negative results do not preclude SARS-CoV-2 infection, do not rule out co-infections with other pathogens, and should not be used as the sole basis for treatment or other patient management decisions. Negative results must be combined with clinical observations, patient history, and epidemiological information. The expected result is Negative. Fact Sheet for Patients: SugarRoll.be Fact Sheet for Healthcare Providers: https://www.woods-mathews.com/ This test is not yet approved or cleared by the [place] FDA and  has been authorized for detection and/or diagnosis of SARS-CoV-2 by FDA under an Emergency Use Authorization (EUA). This EUA will remain  in effect (meaning this test can be used) for the duration of the COVID-19 declaration under Section 56 4(b)(1) of the Act, 21 U.S.C. section 360bbb-3(b)(1), unless the authorization is terminated or revoked sooner. Performed at [facility], [place] [address] [place], [place] [address]     Culture, blood (routine x 2)     Status: None (Preliminary result)   Collection Time: 09/21/19  5:52 PM   Specimen: Porta Cath; Blood  Result Value Ref Range Status   Specimen Description   Final    PORTA CATH Performed at [facility] [place] [address], [place], [place] [address]    Special Requests   Final    BOTTLES DRAWN AEROBIC AND ANAEROBIC Blood Culture adequate volume Performed at [place] [address], [place], [place] [address]    Culture   Final    NO GROWTH 4 DAYS Performed at [facility], [place] [address], [place], [place] [address]    Report Status PENDING  Incomplete    Radiology Studies: No results found.  Scheduled Meds:  amLODipine  10 mg Oral Daily   atorvastatin  20 mg Oral q1800   Chlorhexidine Gluconate Cloth  6 each Topical Daily   cholecalciferol  2,000 Units Oral Daily   dexamethasone  8 mg Intravenous Q24H   feeding supplement  1 Container Oral TID BM   fluticasone  2 spray Each Nare Daily   hydrALAZINE  50 mg Oral TID   loratadine  10 mg Oral Daily   magic mouthwash  5 mL Oral TID   metoprolol tartrate  50 mg Oral BID   pantoprazole (PROTONIX) IV  40 mg Intravenous Q12H   sodium bicarbonate  650 mg Oral BID   sodium  chloride flush  10-40 mL Intracatheter Q12H   Continuous Infusions:  dextrose 5 % with KCl 20 mEq / L 20 mEq (09/25/19 1159)   ondansetron (ZOFRAN) IV 8 mg (09/25/19 1745)    LOS: 4 days   Kerney Elbe, DO [facility] PAGER is on AMION  If 7PM-7AM, please contact night-coverage [email or web address]

## 2019-09-26 LAB — CULTURE, BLOOD (ROUTINE X 2)
Culture: NO GROWTH
Culture: NO GROWTH
Special Requests: ADEQUATE
Special Requests: ADEQUATE

## 2019-09-26 LAB — GLUCOSE, CAPILLARY: Glucose-Capillary: 126 mg/dL — ABNORMAL HIGH (ref 70–99)

## 2019-09-26 LAB — PROTIME-INR
INR: 2.3 — ABNORMAL HIGH (ref 0.8–1.2)
Prothrombin Time: 25.6 seconds — ABNORMAL HIGH (ref 11.4–15.2)

## 2019-09-26 NOTE — Progress Notes (Signed)
PROGRESS NOTE  [name] Foster  DOB: 03-10-1951  PCP: Burnis Medin, MD FHL:[id number]  DOA: 09/21/2019  LOS: 5 days   Chief Complaint  Patient presents with  . Weakness  . Nausea   Brief narrative: [name] Haskinsis a 68 y.o.femalewith a PMH significant for but not limited tooallergic rhinitis and asthma, history of CHF and coronary artery disease, hypertension, hyperlipidemia, myopathy, history of DVT on Coumadin, history of left-sided breast cancer currently on chemotherapy seeing [name] Foster well as other comorbidities. Patient presented to ED on 12/20 with complaint of nausea, vomiting, generalized weakness.   Last chemotherapy on 09/08/2019 and had Neulasta on 09/09/2008. Patient states that about 4 days after her chemotherapy she started feeling sick and had nausea and vomiting which continued to worsen despite antiemetics.  Felt significantly weaker and was unable to tolerate her medications  In the ED, patient was found to have sodium level elevated to 156, chloride elevated 123, BUN/creatinine elevated to 46/2.57, WBC count elevated 25,000, lactic acid level normal. Covid PCR negative. CT abdomen pelvis did not show any intra-abdominal pathology. She was admitted to hospitalist medicine service for further evaluation management.  Subjective: Patient was seen and examined this morning.  [name].  Not in distress.  Still very nauseous and unable to tolerate any appetite.  Almost totally dependent on IV fluid.  Assessment/Plan: Intractable Nausea/Vomiting and poor oral intake -Secondary to chemotherapy. -Last chemotherapy on 12/7/2020and started having nausea and vomiting 4 days later.   -CT abdomen pelvis obtained on admission did not show any acute intra-abdominal finding to explain the symptoms. -Since admission, patient has only remain on clear liquid diet even which patient has not been able to tolerate.  Only given IV fluid.     -Judicious use of antiemetics despite prolonged QTC -She is on several antiemetics now and is on ondansetron 8 mg IV every 8 hours scheduled with 8 mg p.o. every 8hrs PRN, promethazine 12.5 mg IV every 6hrs PRN, 10 mg of Compazine q6 hours PRN. -Medical oncology had now started her on dexamethasone 8 mg daily. -I ordered repeat EKG.  If QTC is improving, I would also add Reglan. -Continue IV hydration. -Continue scheduled PPI. -Medical oncology is following and they have recommended the above antiemetics -Edema feels that if there is no improvement by tomorrow we will add olanzapine -She is also on lorazepam 0.5 mg p.o./sublingual for nausea vomiting every 6 hours for as needed -States her nausea is improved however still taking in very minimal p.o. intake  Acute hypernatremia Acute kidney injury on CKD stage III -All electrolyte abnormalities secondary to poor oral intake. -On admission, sodium level elevated at 156 and creatinine elevated at 2.57. -Labs improving now with hydration.  -Sodium level down to 144 today.  Creatinine down to 2.1. -Currently on D5 with KCl at 50 mils per hour.  Continue the same.  Generalized Weakness -Patient has been refusing to be seen by PT because of weakness.  Definitely needs to be seen by PT.  I encouraged her of that today.  Leukocytosis -WBC count on admission elevated to 25,000, likely due to recent administration of Neulasta.   -Gradually normalizing WBC count.  Thrombocytopenia, worsening  -Patient's Platelet Count went from 169 on 12/7 ->93  -> 70 -> 52 -> 44 -> 44.  Repeat CBC tomorrow. -Continue to Monitor for S/Sx of Bleeding as she was Anticoagulated on Coumadin; Currently no overt bleeding noted and Coumadin is held   Possible renal cell  carcinoma. -CT abdomen obtained on admission showed a tiny subcapsular lesion in the posterior upper pole the left kidney which demonstrate contrast enhancement on prior exam, suspicious for small  renal cell carcinoma.  -Patient will follow-up with oncology as an outpatient.  Left Breast Cancer/Malignant Neoplasm overlapping sites of the Left Breast -Sees [name] Foster in the outpatient Setting -Appreciate [name] Foster evaluation   Cardiovascular issues: HTN /CAD/chronic diastolic CHF/history of aortic insufficiency/HLD -Continue amlodipine, hydralazine, metoprolol, statin.  Keep ramipril and furosemide on hold given AKI.   -Blood pressure in target range.  Continue to monitor. -Continue to monitor in telemetry.  Hx of DVT -Holding Coumadinnow per Oncology until Platelet Count >100,000  Hx of Asthma with Bronchitis -C/w Albuterol Inhaler2 puff IH q6hprn Wheezing and SOB  Prolonged QT -QTc was 595 but now repeated  -Continue to monitor electrolyte. -Since patient is on several different antiemetics with potential of QTC prolongation, will obtain EKG on a daily basis.  Mobility: Increase PT participation Diet: Currently on clear liquid diet only.  Advance as tolerated Fluid: D5 with potassium at 50 mL/h DVT prophylaxis:  SCDs Code Status:  Full code Family Communication:  None at bedside Expected Discharge:  Continue inpatient care  Consultants:  Oncology  Procedures:  None   Antimicrobials: Anti-infectives (From admission, onward)   None        Code Status: Full Code   Diet Order            Diet clear liquid Room service appropriate? Yes; Fluid consistency: Thin  Diet effective now              Infusions:  . dextrose 5 % with KCl 20 mEq / L 20 mEq (09/25/19 2131)  . ondansetron [facility]) IV 8 mg (09/26/19 1738)    Scheduled Meds: . amLODipine  10 mg Oral Daily  . atorvastatin  20 mg Oral q1800  . Chlorhexidine Gluconate Cloth  6 each Topical Daily  . cholecalciferol  2,000 Units Oral Daily  . dexamethasone  8 mg Intravenous Q24H  . feeding supplement  1 Container Oral TID BM  . fluticasone  2 spray Each Nare Daily  . hydrALAZINE  50 mg  Oral TID  . loratadine  10 mg Oral Daily  . magic mouthwash  5 mL Oral TID  . metoprolol tartrate  50 mg Oral BID  . pantoprazole (PROTONIX) IV  40 mg Intravenous Q12H  . sodium bicarbonate  650 mg Oral BID  . sodium chloride flush  10-40 mL Intracatheter Q12H    PRN meds: acetaminophen **OR** acetaminophen, albuterol, bisacodyl, LORazepam, ondansetron, polyethylene glycol, polyvinyl alcohol, prochlorperazine, prochlorperazine, promethazine, sodium chloride, sodium chloride flush   Objective: Vitals:   09/26/19 0530 09/26/19 1440  BP: 135/78 132/73  Pulse: 62 (!) 59  Resp: 16 16  Temp: 98.2 F (36.8 C) 98.4 F (36.9 C)  SpO2: 99% 100%    Intake/Output Summary (Last 24 hours) at 09/26/2019 1748 Last data filed at 09/25/2019 2133 Gross per 24 hour  Intake 10 ml  Output --  Net 10 ml   Filed Weights   09/23/19 0550 09/24/19 0850 09/25/19 0802  Weight: 80.4 kg 89.9 kg 90.8 kg   Weight change: 0.9 kg Body mass index is 33.31 kg/m.   Physical Exam: General exam: Appears calm and comfortable.  Skin: No rashes, lesions or ulcers. HEENT: Atraumatic, normocephalic, supple neck, no obvious bleeding Lungs: Clear to auscultation bilaterally CVS: Regular rate and rhythm, no murmur GI/Abd: soft, nontender, nondistended,  bowel sound present negative CNS: MRI alert, awake monitor x3 Psychiatry: Mood appropriate Extremities: No pedal edema, no calf tenderness  Data Review: I have personally reviewed the laboratory data and studies available.  Recent Labs  Lab 09/21/19 1336 09/22/19 0433 09/23/19 0351 09/24/19 0450 09/25/19 0426  WBC 24.8* 22.6* 17.3* 13.8* 14.7*  NEUTROABS 20.5* 18.1*  --   --   --   HGB 12.4 10.4* 9.6* 8.7* 8.5*  HCT 41.7 35.5* 32.8* 30.0* 27.9*  MCV 98.6 98.1 98.8 98.7 97.2  PLT 93* 70* 52* 44* 44*   Recent Labs  Lab 09/21/19 1336 09/21/19 1752 09/22/19 0433 09/23/19 0351 09/24/19 0450 09/25/19 0426  NA 157*  --  156* 155* 151* 144  K 3.9   --  3.3* 3.3* 3.4* 4.7  CL 118*  --  123* 125* 125* 117*  CO2 22  --  23 23 21* 20*  GLUCOSE 107*  --  182* 153* 129* 184*  BUN 57*  --  46* 34* 24* 20  CREATININE 3.13*  --  2.57* 2.40* 2.23* 2.10*  CALCIUM 9.1  --  8.5* 8.3* 8.1* 8.2*  MG  --  2.3 2.4  --   --  1.9  PHOS  --  3.4 2.8  --   --  2.4*   Terrilee Croak, MD  [facility] 09/26/2019

## 2019-09-26 NOTE — Progress Notes (Signed)
Occupational Therapy Treatment Patient Details Name: [name] Foster MRN: [id number] DOB: 07/31/1951 Today's Date: 09/26/2019    History of present illness Pt is a 68 year old female with "PMH significant for but not limited too allergic rhinitis and asthma, history of CHF and coronary artery disease, hypertension, hyperlipidemia, myopathy, history of DVT on Coumadin, history of left-sided breast cancer currently on chemotherapy seeing Dr. Nicholas Lose, as well as other comorbidities who presents with a chief complaint of nausea vomiting as well as generalized weakness and fatigue.  Of note patient recently had chemotherapy on 09/08/2019 and had Neulasta on 09/09/2008."   OT comments  Pt continues to have nausea/vomiting and also had liquid stool today.  She tried to work through this and completed UB bathing, grooming and toileting. Sat up in chair at end of session  Follow Up Recommendations  No OT follow up;Supervision - Intermittent    Equipment Recommendations  Tub/shower bench(if pt wants, this is an out of pocket expense)    Recommendations for Other Services      Precautions / Restrictions Precautions Precautions: Fall Precaution Comments: nauseous with movement Restrictions Weight Bearing Restrictions: No       Mobility Bed Mobility         Supine to sit: Supervision     General bed mobility comments: extra time  Transfers   Equipment used: Rolling walker (2 wheeled)   Sit to Stand: Min guard Stand pivot transfers: Min guard       General transfer comment: no device for SPT to chair and RW to take several steps to chair    Balance                                           ADL either performed or assessed with clinical judgement   ADL       Grooming: Oral care;Set up;Sitting   Upper Body Bathing: Set up;Sitting               Toilet Transfer: Min guard;Stand-pivot;BSC;RW   Toileting- Clothing Manipulation and Hygiene:  Maximal assistance;Sit to/from stand         General ADL Comments: limited by nausea/vomiting.  Pt performed UB bathing, grooming, toileting then got up to chair     Vision       Perception     Praxis      Cognition Arousal/Alertness: Awake/alert Behavior During Therapy: WFL for tasks assessed/performed Overall Cognitive Status: Within Functional Limits for tasks assessed                                          Exercises     Shoulder Instructions       General Comments      Pertinent Vitals/ Pain       Pain Assessment: No/denies pain  Home Living                                          Prior Functioning/Environment              Frequency  Min 2X/week        Progress Toward Goals  OT Goals(current goals can now be found in  the care plan section)  Progress towards OT goals: (needed more assist today due to nausea/vomiting)     Plan      Co-evaluation                 AM-PAC OT "6 Clicks" Daily Activity     Outcome Measure   Help from another person eating meals?: None Help from another person taking care of personal grooming?: A Little Help from another person toileting, which includes using toliet, bedpan, or urinal?: A Lot Help from another person bathing (including washing, rinsing, drying)?: A Little Help from another person to put on and taking off regular upper body clothing?: A Little Help from another person to put on and taking off regular lower body clothing?: A Lot 6 Click Score: 17    End of Session    OT Visit Diagnosis: Unsteadiness on feet (R26.81);Muscle weakness (generalized) (M62.81)   Activity Tolerance Treatment limited secondary to medical complications (Comment)   Patient Left in chair;with call bell/phone within reach;with chair alarm set   Nurse Communication          Time: 1287-8676 OT Time Calculation (min): 42 min  Charges: OT General Charges $OT Visit: 1  Visit OT Treatments $Self Care/Home Management : 23-37 mins  [place], OTR/L Acute Rehabilitation Services 09/26/2019   Lakshya Mcgillicuddy 09/26/2019, 12:15 PM

## 2019-09-27 LAB — PROTIME-INR
INR: 2.2 — ABNORMAL HIGH (ref 0.8–1.2)
Prothrombin Time: 24.3 seconds — ABNORMAL HIGH (ref 11.4–15.2)

## 2019-09-27 LAB — CBC WITH DIFFERENTIAL/PLATELET
Abs Immature Granulocytes: 0.3 10*3/uL — ABNORMAL HIGH (ref 0.00–0.07)
Basophils Absolute: 0 10*3/uL (ref 0.0–0.1)
Basophils Relative: 0 %
Eosinophils Absolute: 0 10*3/uL (ref 0.0–0.5)
Eosinophils Relative: 0 %
HCT: 28.4 % — ABNORMAL LOW (ref 36.0–46.0)
Hemoglobin: 8.8 g/dL — ABNORMAL LOW (ref 12.0–15.0)
Immature Granulocytes: 3 %
Lymphocytes Relative: 7 %
Lymphs Abs: 0.8 10*3/uL (ref 0.7–4.0)
MCH: 28.8 pg (ref 26.0–34.0)
MCHC: 31 g/dL (ref 30.0–36.0)
MCV: 92.8 fL (ref 80.0–100.0)
Monocytes Absolute: 0.2 10*3/uL (ref 0.1–1.0)
Monocytes Relative: 1 %
Neutro Abs: 10.7 10*3/uL — ABNORMAL HIGH (ref 1.7–7.7)
Neutrophils Relative %: 89 %
Platelets: 71 10*3/uL — ABNORMAL LOW (ref 150–400)
RBC: 3.06 MIL/uL — ABNORMAL LOW (ref 3.87–5.11)
RDW: 15.4 % (ref 11.5–15.5)
WBC: 12 10*3/uL — ABNORMAL HIGH (ref 4.0–10.5)
nRBC: 0 % (ref 0.0–0.2)

## 2019-09-27 LAB — BASIC METABOLIC PANEL
Anion gap: 8 (ref 5–15)
BUN: 20 mg/dL (ref 8–23)
CO2: 21 mmol/L — ABNORMAL LOW (ref 22–32)
Calcium: 8.1 mg/dL — ABNORMAL LOW (ref 8.9–10.3)
Chloride: 108 mmol/L (ref 98–111)
Creatinine, Ser: 2.18 mg/dL — ABNORMAL HIGH (ref 0.44–1.00)
GFR calc Af Amer: 26 mL/min — ABNORMAL LOW (ref >60)
GFR calc non Af Amer: 23 mL/min — ABNORMAL LOW (ref >60)
Glucose, Bld: 167 mg/dL — ABNORMAL HIGH (ref 70–99)
Potassium: 3.9 mmol/L (ref 3.5–5.1)
Sodium: 137 mmol/L (ref 135–145)

## 2019-09-27 LAB — GLUCOSE, CAPILLARY: Glucose-Capillary: 151 mg/dL — ABNORMAL HIGH (ref 70–99)

## 2019-09-27 LAB — MAGNESIUM: Magnesium: 1.8 mg/dL (ref 1.7–2.4)

## 2019-09-27 LAB — PHOSPHORUS: Phosphorus: 3.5 mg/dL (ref 2.5–4.6)

## 2019-09-27 NOTE — Progress Notes (Signed)
PROGRESS NOTE  [name] Foster  DOB: [birth year]  PCP: [name] Medin, MD HDQ:[id number]  DOA: 09/21/2019  LOS: 6 days   Chief Complaint  Patient presents with  . Weakness  . Nausea   Brief narrative: [name] Haskinsis a 68 y.o.femalewith a PMH significant for but not limited tooallergic rhinitis and asthma, history of CHF and coronary artery disease, hypertension, hyperlipidemia, myopathy, history of DVT on Coumadin, history of left-sided breast cancer currently on chemotherapy seeing [name] Foster well as other comorbidities. Patient presented to ED on 12/20 with complaint of nausea, vomiting, generalized weakness.   Last chemotherapy on 09/08/2019 and had Neulasta on 09/09/2008. Patient states that about 4 days after her chemotherapy she started feeling sick and had nausea and vomiting which continued to worsen despite antiemetics.  Felt significantly weaker and was unable to tolerate her medications  In the ED, patient was found to have sodium level elevated to 156, chloride elevated 123, BUN/creatinine elevated to 46/2.57, WBC count elevated 25,000, lactic acid level normal. Covid PCR negative. CT abdomen pelvis did not show any intra-abdominal pathology. She was admitted to hospitalist medicine service for further evaluation management.  Subjective: Patient was seen and examined this morning.  [name].  Not in distress.   Still remains nauseous. She says she tried cereal this morning without success. Patient has also been politely refusing to participate with physical therapy because of weakness.  Assessment/Plan: Intractable Nausea/Vomiting and poor oral intake -Secondary to chemotherapy. Last chemotherapy on 12/7/2020and started having nausea and vomiting 4 days later. -CT abdomen pelvis obtained on admission did not show any acute intra-abdominal finding to explain the symptoms. -Since admission, patient has only remained on clear  liquid diet even which patient has not been able to tolerate.  Only given IV fluid.   -Judicious use of antiemetics despite prolonged QTC -She is on several antiemetics now and is on ondansetron 8 mg IV every 8 hours scheduled with 8 mg p.o. every 8hrs PRN, promethazine 12.5 mg IV every 6hrs PRN, 10 mg of Compazine q6 hours PRN. -Medical oncology had now started her on dexamethasone 8 mg daily. -I ordered repeat EKG.  If QTC is improving, I would also add Reglan. -Continue IV hydration. -Continue scheduled PPI. -Medical oncology is following and they have recommended the above antiemetics -Edema feels that if there is no improvement by tomorrow we will add olanzapine -She is also on lorazepam 0.5 mg p.o./sublingual for nausea vomiting every 6 hours for as needed -States her nausea is improved however still taking in very minimal p.o. intake  Acute hypernatremia Acute kidney injury on CKD stage III -All electrolyte abnormalities secondary to poor oral intake. -On admission, sodium level was elevated at 156 and creatinine elevated at 2.57. -Labs improving now with hydration.  -Sodium level down to 137 today.  Creatinine down to 2.18 -Currently on D5 with KCl at 50 mils per hour.  Continue the same.  Generalized Weakness -Patient has been refusing to be seen by PT because of weakness.  Definitely needs to be seen by PT.  I encouraged her of that today.  Leukocytosis -WBC count on admission elevated to 25,000, likely due to recent administration of Neulasta.   -Gradually normalizing WBC count.  Thrombocytopenia, worsening  -Patient's Platelet Count went from 169 on 12/7 ->93  -> 70 -> 52 -> 44 -> 44.  Repeat CBC tomorrow. -Continue to Monitor for S/Sx of Bleeding as she was Anticoagulated on Coumadin; Currently no overt bleeding noted  and Coumadin is held   Possible renal cell carcinoma. -CT abdomen obtained on admission showed a tiny subcapsular lesion in the posterior upper pole the  left kidney which demonstrate contrast enhancement on prior exam, suspicious for small renal cell carcinoma.  -Patient will follow-up with oncology as an outpatient.  Left Breast Cancer/Malignant Neoplasm overlapping sites of the Left Breast -Sees [name] Foster in the outpatient Setting -Appreciate [name] Foster evaluation   Cardiovascular issues: HTN /CAD/chronic diastolic CHF/history of aortic insufficiency/HLD -Continue amlodipine, hydralazine, metoprolol, statin.  Keep ramipril and furosemide on hold given AKI.   -Blood pressure in target range.  Continue to monitor. -Continue to monitor in telemetry.  Hx of DVT -Holding Coumadinnow per Oncology until Platelet Count >100,000  Hx of Asthma with Bronchitis -C/w Albuterol Inhaler2 puff IH q6hprn Wheezing and SOB  Prolonged QT -QTc was 595 but now repeated  -Continue to monitor electrolyte. -Since patient is on several different antiemetics with potential of QTC prolongation, will obtain EKG on a daily basis.  Mobility: Increase PT participation Diet: Currently on clear liquid diet only.  Advance as tolerated Fluid: D5 with potassium at 50 ml/h DVT prophylaxis:  SCDs Code Status:  Full code Family Communication:  None at bedside Expected Discharge:  Continue inpatient care  Consultants:  Oncology  Procedures:  None   Antimicrobials: Anti-infectives (From admission, onward)   None        Code Status: Full Code   Diet Order            Diet regular Room service appropriate? Yes; Fluid consistency: Thin  Diet effective now              Infusions:  . dextrose 5 % with KCl 20 mEq / L 20 mEq (09/26/19 2313)  . ondansetron [facility]) IV 8 mg (09/27/19 1154)    Scheduled Meds: . amLODipine  10 mg Oral Daily  . atorvastatin  20 mg Oral q1800  . Chlorhexidine Gluconate Cloth  6 each Topical Daily  . cholecalciferol  2,000 Units Oral Daily  . dexamethasone  8 mg Intravenous Q24H  . feeding supplement  1  Container Oral TID BM  . fluticasone  2 spray Each Nare Daily  . hydrALAZINE  50 mg Oral TID  . loratadine  10 mg Oral Daily  . magic mouthwash  5 mL Oral TID  . metoprolol tartrate  50 mg Oral BID  . pantoprazole (PROTONIX) IV  40 mg Intravenous Q12H  . sodium chloride flush  10-40 mL Intracatheter Q12H    PRN meds: acetaminophen **OR** acetaminophen, albuterol, bisacodyl, LORazepam, ondansetron, polyethylene glycol, polyvinyl alcohol, prochlorperazine, prochlorperazine, promethazine, sodium chloride, sodium chloride flush   Objective: Vitals:   09/27/19 0615 09/27/19 1406  BP: 112/60 122/70  Pulse: (!) 59 64  Resp: 19 14  Temp: 98.5 F (36.9 C) 98.4 F (36.9 C)  SpO2: 99% 100%    Intake/Output Summary (Last 24 hours) at 09/27/2019 1610 Last data filed at 09/27/2019 0132 Gross per 24 hour  Intake 1616.35 ml  Output --  Net 1616.35 ml   Filed Weights   09/24/19 0850 09/25/19 0802 09/27/19 0612  Weight: 89.9 kg 90.8 kg 92.1 kg   Weight change: 1.3 kg Body mass index is 33.79 kg/m.   Physical Exam: General exam: Appears calm and comfortable.  Skin: No rashes, lesions or ulcers. HEENT: Atraumatic, normocephalic, supple neck, no obvious bleeding Lungs: Clear to auscultation bilaterally CVS: Regular rate and rhythm, no murmur GI/Abd: soft, nontender, nondistended, bowel  sound present negative CNS: MRI alert, awake monitor x3 Psychiatry: Mood appropriate Extremities: No pedal edema, no calf tenderness  Data Review: I have personally reviewed the laboratory data and studies available.  Recent Labs  Lab 09/21/19 1336 09/22/19 0433 09/23/19 0351 09/24/19 0450 09/25/19 0426 09/27/19 0451  WBC 24.8* 22.6* 17.3* 13.8* 14.7* 12.0*  NEUTROABS 20.5* 18.1*  --   --   --  10.7*  HGB 12.4 10.4* 9.6* 8.7* 8.5* 8.8*  HCT 41.7 35.5* 32.8* 30.0* 27.9* 28.4*  MCV 98.6 98.1 98.8 98.7 97.2 92.8  PLT 93* 70* 52* 44* 44* 71*   Recent Labs  Lab 09/21/19 1752 09/22/19 0433  09/23/19 0351 09/24/19 0450 09/25/19 0426 09/27/19 0451  NA  --  156* 155* 151* 144 137  K  --  3.3* 3.3* 3.4* 4.7 3.9  CL  --  123* 125* 125* 117* 108  CO2  --  23 23 21* 20* 21*  GLUCOSE  --  182* 153* 129* 184* 167*  BUN  --  46* 34* 24* 20 20  CREATININE  --  2.57* 2.40* 2.23* 2.10* 2.18*  CALCIUM  --  8.5* 8.3* 8.1* 8.2* 8.1*  MG 2.3 2.4  --   --  1.9 1.8  PHOS 3.4 2.8  --   --  2.4* 3.5   Terrilee Croak, MD  [facility] 09/27/2019

## 2019-09-28 LAB — PROTIME-INR
INR: 2.2 — ABNORMAL HIGH (ref 0.8–1.2)
Prothrombin Time: 24.2 seconds — ABNORMAL HIGH (ref 11.4–15.2)

## 2019-09-28 LAB — GLUCOSE, CAPILLARY: Glucose-Capillary: 130 mg/dL — ABNORMAL HIGH (ref 70–99)

## 2019-09-28 NOTE — Progress Notes (Signed)
PROGRESS NOTE  [name] Foster  DOB: 1950/11/28  PCP: Burnis Medin, MD GMW:[id number]  DOA: 09/21/2019  LOS: 7 days   Chief Complaint  Patient presents with  . Weakness  . Nausea   Brief narrative: [name] Haskinsis a 68 y.o.femalewith a PMH significant for but not limited tooallergic rhinitis and asthma, history of CHF and coronary artery disease, hypertension, hyperlipidemia, myopathy, history of DVT on Coumadin, history of left-sided breast cancer currently on chemotherapy seeing Dr. Haskell Flirt well as other comorbidities. Patient presented to ED on 12/20 with complaint of nausea, vomiting, generalized weakness.   Last chemotherapy on 09/08/2019 and had Neulasta on 09/09/2008. Patient states that about 4 days after her chemotherapy she started feeling sick and had nausea and vomiting which continued to worsen despite antiemetics.  Felt significantly weaker and was unable to tolerate her medications  In the ED, patient was found to have sodium level elevated to 156, chloride elevated 123, BUN/creatinine elevated to 46/2.57, WBC count elevated 25,000, lactic acid level normal. Covid PCR negative. CT abdomen pelvis did not show any intra-abdominal pathology. She was admitted to hospitalist medicine service for further evaluation management.  Subjective: Patient was seen and examined this morning.  [name].  Not in distress. She felt less nauseous today and was able to try her breakfast but did not like it because she does not feel any taste.  Assessment/Plan: Intractable Nausea/Vomiting and poor oral intake -Secondary to chemotherapy. Last chemotherapy on 12/7/2020and started having nausea and vomiting 4 days later.  -CT abdomen pelvis obtained on admission did not show any acute intra-abdominal finding to explain the symptoms. -Since admission, patient has essentially only remained on clear liquid diet even which patient has not been able to  tolerate.  Only given IV fluid.   -Judicious use of antiemetics despite prolonged QTC -She is on several antiemetics now and is on ondansetron 8 mg IV every 8 hours scheduled with 8 mg p.o. every 8hrs PRN, promethazine 12.5 mg IV every 6hrs PRN, 10 mg of Compazine q6 hours PRN. -Medical oncology had now started her on dexamethasone 8 mg daily. -I ordered repeat EKG.  If QTC is improving, I would also add Reglan. -Continue IV hydration. -Continue scheduled PPI. -Medical oncology is following and they have recommended the above antiemetics -Edema feels that if there is no improvement by tomorrow we will add olanzapine -She is also on lorazepam 0.5 mg p.o./sublingual for nausea vomiting every 6 hours for as needed -States her nausea is improved however still taking in very minimal p.o. intake  Acute hypernatremia Acute kidney injury on CKD stage III -All electrolyte abnormalities secondary to poor oral intake. -On admission, sodium level was elevated at 156 and creatinine elevated at 2.57. -Labs improving now with hydration.  -Sodium level down to 137 today.  Creatinine down to 2.18 on last check on 12/26.  Repeat labs tomorrow. -Currently on D5 with KCl at 50 mils per hour.  Continue the same.  Generalized Weakness -Patient has been refusing to be seen by PT because of weakness.  Definitely needs to be seen by PT.  I encouraged her of that today.  Leukocytosis -WBC count on admission elevated to 25,000, likely due to recent administration of Neulasta.   -Gradually normalizing WBC count, 12,000 on last check on 12/26.  Thrombocytopenia, worsening  -Patient's Platelet Count went from 169 on 12/7 ->93  -> 70 -> 52 -> 44 -> 44.  Repeat CBC tomorrow. -Continue to Monitor for S/Sx  of Bleeding as she was Anticoagulated on Coumadin; Currently no overt bleeding noted and Coumadin is held   Possible renal cell carcinoma. -CT abdomen obtained on admission showed a tiny subcapsular lesion in  the posterior upper pole the left kidney which demonstrate contrast enhancement on prior exam, suspicious for small renal cell carcinoma.  -Patient will follow-up with oncology as an outpatient.  Left Breast Cancer/Malignant Neoplasm overlapping sites of the Left Breast -Sees Dr. Lindi Adie in the outpatient Setting -Appreciate Dr. Geralyn Flash evaluation   Cardiovascular issues: HTN /CAD/chronic diastolic CHF/history of aortic insufficiency/HLD -Continue amlodipine, hydralazine, metoprolol, statin.  Keep ramipril and furosemide on hold given AKI.   -Blood pressure in target range.  Continue to monitor. -Continue to monitor in telemetry.  Hx of DVT -Holding Coumadinnow per Oncology until Platelet Count >100,000  Hx of Asthma with Bronchitis -C/w Albuterol Inhaler2 puff IH q6hprn Wheezing and SOB  Prolonged QT -QTc was 595 but now repeated  -Continue to monitor electrolyte. -Since patient is on several different antiemetics with potential of QTC prolongation, will obtain EKG on a daily basis.  Mobility: Increase PT participation Diet: Patient has order for regular diet but she has very poor appetite.   Fluid: D5 with potassium at 50 ml/h DVT prophylaxis:  SCDs Code Status:  Full code Family Communication:  None at bedside Expected Discharge:  I have stressed to the patient multiple times that she needs to improve her appetite and activity.  Anticipate discharge to home tomorrow.  Consultants:  Oncology  Procedures:  None   Antimicrobials: Anti-infectives (From admission, onward)   None        Code Status: Full Code   Diet Order            Diet regular Room service appropriate? Yes; Fluid consistency: Thin  Diet effective now              Infusions:  . dextrose 5 % with KCl 20 mEq / L 20 mEq (09/28/19 1225)  . ondansetron [facility]) IV 8 mg (09/28/19 8563)    Scheduled Meds: . amLODipine  10 mg Oral Daily  . atorvastatin  20 mg Oral q1800  . Chlorhexidine  Gluconate Cloth  6 each Topical Daily  . cholecalciferol  2,000 Units Oral Daily  . dexamethasone  8 mg Intravenous Q24H  . feeding supplement  1 Container Oral TID BM  . fluticasone  2 spray Each Nare Daily  . hydrALAZINE  50 mg Oral TID  . loratadine  10 mg Oral Daily  . magic mouthwash  5 mL Oral TID  . metoprolol tartrate  50 mg Oral BID  . pantoprazole (PROTONIX) IV  40 mg Intravenous Q12H  . sodium chloride flush  10-40 mL Intracatheter Q12H    PRN meds: acetaminophen **OR** acetaminophen, albuterol, bisacodyl, LORazepam, ondansetron, polyethylene glycol, polyvinyl alcohol, prochlorperazine, prochlorperazine, promethazine, sodium chloride, sodium chloride flush   Objective: Vitals:   09/28/19 0534 09/28/19 1443  BP: 135/78 126/71  Pulse: (!) 59 (!) 58  Resp: 18 12  Temp: 98.6 F (37 C) 98.5 F (36.9 C)  SpO2: 99% 99%   No intake or output data in the 24 hours ending 09/28/19 1711 Filed Weights   09/25/19 0802 09/27/19 0612 09/28/19 0534  Weight: 90.8 kg 92.1 kg 92.3 kg   Weight change: 0.2 kg Body mass index is 33.86 kg/m.   Physical Exam: General exam: Appears calm and comfortable.  Not in physical distress. Skin: No rashes, lesions or ulcers. HEENT: Atraumatic, normocephalic,  supple neck, no obvious bleeding Lungs: Clear to auscultation bilaterally CVS: Regular rate and rhythm, no murmur GI/Abd: soft, nontender, nondistended, bowel sound present negative CNS: MRI alert, awake monitor x3 Psychiatry: Mood appropriate Extremities: No pedal edema, no calf tenderness  Data Review: I have personally reviewed the laboratory data and studies available.  Recent Labs  Lab 09/22/19 0433 09/23/19 0351 09/24/19 0450 09/25/19 0426 09/27/19 0451  WBC 22.6* 17.3* 13.8* 14.7* 12.0*  NEUTROABS 18.1*  --   --   --  10.7*  HGB 10.4* 9.6* 8.7* 8.5* 8.8*  HCT 35.5* 32.8* 30.0* 27.9* 28.4*  MCV 98.1 98.8 98.7 97.2 92.8  PLT 70* 52* 44* 44* 71*   Recent Labs  Lab  09/21/19 1752 09/22/19 0433 09/23/19 0351 09/24/19 0450 09/25/19 0426 09/27/19 0451  NA  --  156* 155* 151* 144 137  K  --  3.3* 3.3* 3.4* 4.7 3.9  CL  --  123* 125* 125* 117* 108  CO2  --  23 23 21* 20* 21*  GLUCOSE  --  182* 153* 129* 184* 167*  BUN  --  46* 34* 24* 20 20  CREATININE  --  2.57* 2.40* 2.23* 2.10* 2.18*  CALCIUM  --  8.5* 8.3* 8.1* 8.2* 8.1*  MG 2.3 2.4  --   --  1.9 1.8  PHOS 3.4 2.8  --   --  2.4* 3.5   Terrilee Croak, MD  [facility] 09/28/2019

## 2019-09-29 DIAGNOSIS — T451X5A Adverse effect of antineoplastic and immunosuppressive drugs, initial encounter: Secondary | ICD-10-CM

## 2019-09-29 LAB — COMPREHENSIVE METABOLIC PANEL WITH GFR
ALT: 28 U/L (ref 0–44)
AST: 19 U/L (ref 15–41)
Albumin: 2.7 g/dL — ABNORMAL LOW (ref 3.5–5.0)
Alkaline Phosphatase: 77 U/L (ref 38–126)
Anion gap: 8 (ref 5–15)
BUN: 18 mg/dL (ref 8–23)
CO2: 20 mmol/L — ABNORMAL LOW (ref 22–32)
Calcium: 7.8 mg/dL — ABNORMAL LOW (ref 8.9–10.3)
Chloride: 105 mmol/L (ref 98–111)
Creatinine, Ser: 2.22 mg/dL — ABNORMAL HIGH (ref 0.44–1.00)
GFR calc Af Amer: 26 mL/min — ABNORMAL LOW
GFR calc non Af Amer: 22 mL/min — ABNORMAL LOW
Glucose, Bld: 190 mg/dL — ABNORMAL HIGH (ref 70–99)
Potassium: 3.9 mmol/L (ref 3.5–5.1)
Sodium: 133 mmol/L — ABNORMAL LOW (ref 135–145)
Total Bilirubin: 0.6 mg/dL (ref 0.3–1.2)
Total Protein: 4.9 g/dL — ABNORMAL LOW (ref 6.5–8.1)

## 2019-09-29 LAB — CBC WITH DIFFERENTIAL/PLATELET
Abs Immature Granulocytes: 0.28 K/uL — ABNORMAL HIGH (ref 0.00–0.07)
Basophils Absolute: 0 K/uL (ref 0.0–0.1)
Basophils Relative: 0 %
Eosinophils Absolute: 0 K/uL (ref 0.0–0.5)
Eosinophils Relative: 0 %
HCT: 25 % — ABNORMAL LOW (ref 36.0–46.0)
Hemoglobin: 8 g/dL — ABNORMAL LOW (ref 12.0–15.0)
Immature Granulocytes: 4 %
Lymphocytes Relative: 10 %
Lymphs Abs: 0.8 K/uL (ref 0.7–4.0)
MCH: 29 pg (ref 26.0–34.0)
MCHC: 32 g/dL (ref 30.0–36.0)
MCV: 90.6 fL (ref 80.0–100.0)
Monocytes Absolute: 0.2 K/uL (ref 0.1–1.0)
Monocytes Relative: 3 %
Neutro Abs: 6.5 K/uL (ref 1.7–7.7)
Neutrophils Relative %: 83 %
Platelets: 100 K/uL — ABNORMAL LOW (ref 150–400)
RBC: 2.76 MIL/uL — ABNORMAL LOW (ref 3.87–5.11)
RDW: 15.1 % (ref 11.5–15.5)
WBC: 7.8 K/uL (ref 4.0–10.5)
nRBC: 0 % (ref 0.0–0.2)

## 2019-09-29 LAB — PROTIME-INR
INR: 1.7 — ABNORMAL HIGH (ref 0.8–1.2)
Prothrombin Time: 19.9 seconds — ABNORMAL HIGH (ref 11.4–15.2)

## 2019-09-29 LAB — GLUCOSE, CAPILLARY: Glucose-Capillary: 129 mg/dL — ABNORMAL HIGH (ref 70–99)

## 2019-09-29 MED ORDER — WARFARIN - PHARMACIST DOSING INPATIENT: Freq: Every day | Status: DC

## 2019-09-29 MED ORDER — ENSURE ENLIVE PO LIQD: 237.0000 mL | Freq: Two times a day (BID) | ORAL | Status: DC

## 2019-09-29 MED ORDER — OLANZAPINE 5 MG PO TABS
5.0000 mg | ORAL_TABLET | Freq: Every day | ORAL | Status: DC
  Administered 2019-09-29 – 2019-10-02 (×4): 5 mg via ORAL
  Filled 2019-09-29 (×5): qty 1

## 2019-09-29 MED ORDER — WARFARIN SODIUM 5 MG PO TABS
5.0000 mg | ORAL_TABLET | Freq: Once | ORAL | Status: AC
  Administered 2019-09-29: 5 mg via ORAL
  Filled 2019-09-29: qty 1

## 2019-09-29 NOTE — Progress Notes (Signed)
[facility] for warfarin Indication: VTE treatment  Allergies  Allergen Reactions  . Penicillins Rash    Did it involve swelling of the face/tongue/throat, SOB, or low BP? Unknown Did it involve sudden or severe rash/hives, skin peeling, or any reaction on the inside of your mouth or nose? Yes Did you need to seek medical attention at a hospital or doctor's office? Yes When did it last happen?in her 64s If all above answers are "NO", may proceed with cephalosporin use.   . Tetanus Toxoid Rash    Caused cellulitis   . Aspirin Rash   Patient Measurements: Height: 5\' 5"  (165.1 cm) Weight: 202 lb 13.2 oz (92 kg) IBW/kg (Calculated) : 57 Heparin Dosing Weight:   Vital Signs: Temp: 98.7 F (37.1 C) (12/28 0447) Temp Source: Oral (12/28 0447) BP: 131/78 (12/28 1053) Pulse Rate: [name] (12/28 1053)  Labs: Recent Labs    09/27/19 0451 09/28/19 0435 09/29/19 0259  HGB 8.8*  --  8.0*  HCT 28.4*  --  25.0*  PLT 71*  --  100*  LABPROT 24.3* 24.2* 19.9*  INR 2.2* 2.2* 1.7*  CREATININE 2.18*  --  2.22*   Estimated Creatinine Clearance: 27.6 mL/min (A) (by C-G formula based on SCr of 2.22 mg/dL (H)).  Medical History: Past Medical History:  Diagnosis Date  . Allergic rhinitis   . Aortic valve disorders   . Arthritis   . Asthma   . CHF (congestive heart failure) ([place])   . Coronary artery disease   . Family history of adverse reaction to anesthesia    difficulty waking mother up after surgery  . Family history of breast cancer   . Family history of cervical cancer   . Family history of colon cancer   . Family history of throat cancer   . Heart murmur   . Hyperlipidemia   . Hypertension   . Long term (current) use of anticoagulants   . Other primary cardiomyopathies   . Personal history of colonic polyps   . Personal history of venous thrombosis and embolism     Medications:  Scheduled:  . amLODipine  10 mg Oral Daily  .  atorvastatin  20 mg Oral q1800  . Chlorhexidine Gluconate Cloth  6 each Topical Daily  . cholecalciferol  2,000 Units Oral Daily  . dexamethasone  8 mg Intravenous Q24H  . feeding supplement  1 Container Oral TID BM  . fluticasone  2 spray Each Nare Daily  . hydrALAZINE  50 mg Oral TID  . loratadine  10 mg Oral Daily  . magic mouthwash  5 mL Oral TID  . metoprolol tartrate  50 mg Oral BID  . OLANZapine  5 mg Oral QHS  . pantoprazole (PROTONIX) IV  40 mg Intravenous Q12H  . sodium chloride flush  10-40 mL Intracatheter Q12H   Assessment: [name] Foster on chronic warfarin for hx of VTE.  Last dose > 7 days prior to admit per med rec.  INR 1.1 on admit. Home dose 5mg  daily exc 2.5mg  qWed. Coumadin has been held due to low plts. Now Coumadin restarting on 12/28. INR down to 1.7. Hgb 8, plts up to 100.  Goal of Therapy:  INR 2-3    Plan:  Give Coumadin 5mg  PO x 1 Monitor daily INR, CBC, s/s of bleed  Elenor Quinones, PharmD, BCPS, BCIDP Clinical Pharmacist 09/29/2019 12:57 PM

## 2019-09-29 NOTE — Care Management Important Message (Signed)
Important Message  Patient Details IM Letter given to Kathrin Greathouse SW Case Manager to present to the Patient Name: [name] Foster MRN: [id number] Date of Birth: 05/10/1951   Medicare Important Message Given:  Yes     Kerin Salen 09/29/2019, 11:30 AM

## 2019-09-29 NOTE — Progress Notes (Signed)
PROGRESS NOTE  [name] Foster AYT:[id number] DOB: Feb 15, 1951 DOA: 09/21/2019 PCP: Burnis Medin, MD   LOS: 8 days   Brief Narrative / Interim history: 68 year old female with history of HTN, CAD, asthma, DVT on Coumadin, breast cancer currently on chemotherapy who came to the hospital on 12/20 due to nausea, vomiting, generalized weakness that have been progressive since her last chemotherapy.  She was found to be dehydrated and was admitted to the hospital.  CT scan of the abdomen and pelvis did not show any intra-abdominal pathology  Subjective / 24h Interval events: She is feeling a little bit better today, had an episode of dry heaving last night but nothing this morning  Assessment & Plan: Principal Problem Intractable nausea/vomiting, poor oral intake -Last chemotherapy 09/08/2019, started having nausea and vomiting 4 days later.  Imaging on admission without any acute intra-abdominal pathology to explain her symptoms -Symptoms somewhat difficult to manage but eventually now seem to be under control for the past 24 hours with scheduled IV Zofran, Decadron -We will probably need transition to oral agents within the next 1 to 2 days if she remains stable -Cannot tolerate more than a few sips of liquids at a time  Active Problems Hyponatremia, acute kidney injury on chronic kidney disease stage III -Likely due to poor p.o. intake, dehydration, sodium 156 on admission and now normalized -Baseline creatinine 1.4-1.8 in 2020, currently still elevated 2.2 but overall stable over the last several days -Avoid nephrotoxic agents, continue to monitor -Stop fluids today she is complaining of swelling in her feet and hands  Generalized weakness -PT consult pending  Leukocytosis -Probably due to Neulasta, gradually normalizing  Thrombocytopenia -Platelets went down as low as 44, now recovering and 100 K this morning  Possible renal cell carcinoma -On CT scan on admission, will be  followed as an outpatient  Breast cancer -Follows with oncology as an outpatient  HTN/CAD/chronic diastolic CHF -Continue Norvasc, statin, metoprolol.  Ramipril and furosemide are on hold.  Stop fluids today  History of DVT -Platelet counts recovered, resume Coumadin  History of asthma/bronchitis -No wheezing, stable  Scheduled Meds: . amLODipine  10 mg Oral Daily  . atorvastatin  20 mg Oral q1800  . Chlorhexidine Gluconate Cloth  6 each Topical Daily  . cholecalciferol  2,000 Units Oral Daily  . dexamethasone  8 mg Intravenous Q24H  . feeding supplement  1 Container Oral TID BM  . fluticasone  2 spray Each Nare Daily  . hydrALAZINE  50 mg Oral TID  . loratadine  10 mg Oral Daily  . magic mouthwash  5 mL Oral TID  . metoprolol tartrate  50 mg Oral BID  . OLANZapine  5 mg Oral QHS  . pantoprazole (PROTONIX) IV  40 mg Intravenous Q12H  . sodium chloride flush  10-40 mL Intracatheter Q12H   Continuous Infusions: . ondansetron (ZOFRAN) IV 8 mg (09/29/19 1107)   PRN Meds:.acetaminophen **OR** acetaminophen, albuterol, bisacodyl, LORazepam, ondansetron, polyethylene glycol, polyvinyl alcohol, prochlorperazine, prochlorperazine, promethazine, sodium chloride, sodium chloride flush  DVT prophylaxis: Started Coumadin Code Status: Full code Family Communication: Discussed with patient Disposition Plan: Home when ready  Consultants:  Oncology  Procedures:  None   Microbiology  None   Antimicrobials: None     Objective: Vitals:   09/28/19 2138 09/28/19 2324 09/29/19 0447 09/29/19 1053  BP: 128/72 116/66 117/70 131/78  Pulse: 64 (!) 59 (!) 58 63  Resp: 19  16 14   Temp: 98.8 F (37.1 C)  98.7 F (  37.1 C)   TempSrc: Oral  Oral   SpO2: 98%  98%   Weight:   92 kg   Height:        Intake/Output Summary (Last 24 hours) at 09/29/2019 1228 Last data filed at 09/29/2019 1108 Gross per 24 hour  Intake 10 ml  Output --  Net 10 ml   Filed Weights   09/27/19 0612  09/28/19 0534 09/29/19 0447  Weight: 92.1 kg 92.3 kg 92 kg    Examination:  Constitutional: NAD Eyes: no scleral icterus ENMT: Mucous membranes are moist.  Neck: normal, supple Respiratory: clear to auscultation bilaterally, no wheezing, no crackles. Normal respiratory effort. Cardiovascular: Regular rate and rhythm, no murmurs / rubs / gallops.  Abdomen: non distended, no tenderness. Bowel sounds positive.  Musculoskeletal: no clubbing / cyanosis.  Skin: no rashes Neurologic: CN 2-12 grossly intact. Strength 5/5 in all 4.  Psychiatric: Normal judgment and insight. Alert and oriented x 3. Normal mood.    Data Reviewed: I have independently reviewed following labs and imaging studies   CBC: Recent Labs  Lab 09/23/19 0351 09/24/19 0450 09/25/19 0426 09/27/19 0451 09/29/19 0259  WBC 17.3* 13.8* 14.7* 12.0* 7.8  NEUTROABS  --   --   --  10.7* 6.5  HGB 9.6* 8.7* 8.5* 8.8* 8.0*  HCT 32.8* 30.0* 27.9* 28.4* 25.0*  MCV 98.8 98.7 97.2 92.8 90.6  PLT 52* 44* 44* 71* 099*   Basic Metabolic Panel: Recent Labs  Lab 09/23/19 0351 09/24/19 0450 09/25/19 0426 09/27/19 0451 09/29/19 0259  NA 155* 151* 144 137 133*  K 3.3* 3.4* 4.7 3.9 3.9  CL 125* 125* 117* 108 105  CO2 23 21* 20* 21* 20*  GLUCOSE 153* 129* 184* 167* 190*  BUN 34* 24* 20 20 18   CREATININE 2.40* 2.23* 2.10* 2.18* 2.22*  CALCIUM 8.3* 8.1* 8.2* 8.1* 7.8*  MG  --   --  1.9 1.8  --   PHOS  --   --  2.4* 3.5  --    Liver Function Tests: Recent Labs  Lab 09/23/19 0351 09/24/19 0450 09/25/19 0426 09/29/19 0259  AST 23 22 21 19   ALT 19 20 22 28   ALKPHOS 118 100 99 77  BILITOT 0.8 0.3 0.2* 0.6  PROT 5.2* 5.0* 4.9* 4.9*  ALBUMIN 3.0* 2.6* 2.6* 2.7*   Coagulation Profile: Recent Labs  Lab 09/25/19 0426 09/26/19 0415 09/27/19 0451 09/28/19 0435 09/29/19 0259  INR 2.4* 2.3* 2.2* 2.2* 1.7*   HbA1C: No results for input(s): HGBA1C in the last 72 hours. CBG: Recent Labs  Lab 09/25/19 0728  09/26/19 0801 09/27/19 0829 09/28/19 0759 09/29/19 0747  GLUCAP 154* 126* 151* 130* 129*    Recent Results (from the past 240 hour(s))  Culture, blood (routine x 2)     Status: None   Collection Time: 09/21/19  1:39 PM   Specimen: BLOOD LEFT ARM  Result Value Ref Range Status   Specimen Description   Final    BLOOD LEFT ARM Performed at [facility] [address], [place], [place] [address]    Special Requests   Final    BOTTLES DRAWN AEROBIC AND ANAEROBIC Blood Culture adequate volume Performed at [facility] [address], [place], [place] [address]    Culture   Final    NO GROWTH 5 DAYS Performed at [facility], [place] [address], [place], [place] [address]    Report Status 09/26/2019 FINAL  Final  SARS CORONAVIRUS  2 (TAT 6-24 HRS) Nasopharyngeal Nasopharyngeal Swab     Status: None   Collection Time: 09/21/19  3:00 PM   Specimen: Nasopharyngeal Swab  Result Value Ref Range Status   SARS Coronavirus 2 NEGATIVE NEGATIVE Final    Comment: (NOTE) SARS-CoV-2 target nucleic acids are NOT DETECTED. The SARS-CoV-2 RNA is generally detectable in upper and lower respiratory specimens during the acute phase of infection. Negative results do not preclude SARS-CoV-2 infection, do not rule out co-infections with other pathogens, and should not be used as the sole basis for treatment or other patient management decisions. Negative results must be combined with clinical observations, patient history, and epidemiological information. The expected result is Negative. Fact Sheet for Patients: SugarRoll.be Fact Sheet for Healthcare Providers: https://www.woods-mathews.com/ This test is not yet approved or cleared by the [place] FDA and  has been authorized for detection and/or diagnosis of SARS-CoV-2 by FDA under an Emergency Use Authorization (EUA). This EUA will remain  in effect (meaning  this test can be used) for the duration of the COVID-19 declaration under Section 56 4(b)(1) of the Act, 21 U.S.C. section 360bbb-3(b)(1), unless the authorization is terminated or revoked sooner. Performed at [facility], [place] [address], [place], [place] [address]   Culture, blood (routine x 2)     Status: None   Collection Time: 09/21/19  5:52 PM   Specimen: Porta Cath; Blood  Result Value Ref Range Status   Specimen Description   Final    PORTA CATH Performed at [facility] [address] [place], [place] [address]    Special Requests   Final    BOTTLES DRAWN AEROBIC AND ANAEROBIC Blood Culture adequate volume Performed at [facility] [address], [place], [place] [address]    Culture   Final    NO GROWTH 5 DAYS Performed at [facility], [place] [address], [place], [place] [address]    Report Status 09/26/2019 FINAL  Final     Radiology Studies: No results found.  Marzetta Board, MD, PhD [facility]  Between 7 am - 7 pm I am available, please contact me via Amion or Securechat  Between 7 pm - 7 am I am not available, please contact night coverage MD/APP via Amion

## 2019-09-29 NOTE — Progress Notes (Signed)
Nutrition Follow-up  INTERVENTION:   -Given poor PO intakes for >8 days now, would recommend consideration of nutrition support.  -D/c Boost Breeze -Ensure Enlive po BID, each supplement provides 350 kcal and 20 grams of protein  NUTRITION DIAGNOSIS:   Increased nutrient needs related to cancer and cancer related treatments as evidenced by estimated needs.  Ongoing.  GOAL:   Patient will meet greater than or equal to 90% of their needs  Not meeting.  MONITOR:   PO intake, Supplement acceptance, Labs, Weight trends, I & O's, Diet advancement  ASSESSMENT:   68 y.o. female with a PMH significant for but not limited too allergic rhinitis and asthma, history of CHF and coronary artery disease, hypertension, hyperlipidemia, myopathy, history of DVT on Coumadin, history of left-sided breast cancer currently on chemotherapy seeing Dr. Nicholas Lose, as well as other comorbidities who presents with a chief complaint of nausea vomiting as well as generalized weakness and fatigue.  **RD working remotely**  Patient continues to have nausea that is worsened when she tries to eat. Pt has not eaten much. Pt did eat breakfast yesterday but nothing else. Today she ate some cereal for lunch. Had episode of vomiting overnight.  Pt is not drinking Boost Breeze. Will trial Ensure supplements.   Admission weight: 171 lbs. Current weight: 202 lbs.   I/Os: +8.9L since admit  Medications: Vitamin D tablet, Magic Mouthwash, IV Zofran Labs reviewed: CBGs: 129-130 Low Na  Diet Order:   Diet Order            Diet regular Room service appropriate? Yes; Fluid consistency: Thin  Diet effective now              EDUCATION NEEDS:   Not appropriate for education at this time  Skin:  Skin Assessment: Reviewed RN Assessment  Last BM:  12/28 -type 4  Height:   Ht Readings from Last 1 Encounters:  09/21/19 5\' 5"  (1.651 m)    Weight:   Wt Readings from Last 1 Encounters:  09/29/19 92 kg     Ideal Body Weight:  56.8 kg  BMI:  Body mass index is 33.75 kg/m.  Estimated Nutritional Needs:   Kcal:  1900-2100  Protein:  85-100g  Fluid:  2L/day  Clayton Bibles, MS, RD, LDN Inpatient Clinical Dietitian Pager: [phone] After Hours Pager: [phone]

## 2019-09-29 NOTE — Plan of Care (Signed)

## 2019-09-29 NOTE — Progress Notes (Addendum)
HEMATOLOGY-ONCOLOGY PROGRESS NOTE  SUBJECTIVE: Continues to have nausea which worsens when food is brought to her.  She is not really having nausea at other times.  States that she vomited last night.  She was able to take in some eggs yesterday morning but no additional food the rest the day.  Reports having intermittent loose stools.  Not really getting up out of bed very much.  Oncology History  Malignant neoplasm of overlapping sites of left breast in female, estrogen receptor negative ([place])  08/01/2019 Initial Diagnosis   Patient palpated left breast lump and skin thickening. Mammogram showed a 3.6cm mass at 3:30 position, a 0.9cm mass at 3:00 position, a 0.7cm mass at 2:00 position, a 0.5cm mass at 2:00 position, a 0.3cm mass at 1:00 position, and a 0.4cm mass at 1:00 position, with 4 abnormal left axillary lymph nodes. Biopsy showed IDC, grade 3, HER-2 + (3+), ER/PR -, Ki67 70%, in the breast and lymph nodes.    08/06/2019 Cancer Staging   Staging form: Breast, AJCC 8th Edition - Clinical: Stage IIIB (cT4, cN1, cM0, G3, ER-, PR-, HER2+) - Signed by Nicholas Lose, MD on 08/06/2019   08/19/2019 -  Chemotherapy   The patient had palonosetron (ALOXI) injection 0.25 mg, 0.25 mg, Intravenous,  Once, 2 of 6 cycles Administration: 0.25 mg (08/19/2019), 0.25 mg (09/08/2019) pegfilgrastim-cbqv (UDENYCA) injection 6 mg, 6 mg, Subcutaneous, Once, 2 of 6 cycles Administration: 6 mg (08/21/2019), 6 mg (09/10/2019) CARBOplatin (PARAPLATIN) 480 mg in sodium chloride 0.9 % 250 mL chemo infusion, 480 mg (110.9 % of original dose 429.6 mg), Intravenous,  Once, 2 of 6 cycles Dose modification:   (original dose 429.6 mg, Cycle 1) Administration: 480 mg (08/19/2019), 340 mg (09/08/2019) DOCEtaxel (TAXOTERE) 160 mg in sodium chloride 0.9 % 250 mL chemo infusion, 75 mg/m2 = 160 mg, Intravenous,  Once, 2 of 6 cycles Dose modification: 60 mg/m2 (original dose 75 mg/m2, Cycle 2, Reason: Dose not  tolerated) Administration: 160 mg (08/19/2019), 130 mg (09/08/2019) pertuzumab (PERJETA) 420 mg in sodium chloride 0.9 % 250 mL chemo infusion, 420 mg (100 % of original dose 420 mg), Intravenous, Once, 2 of 6 cycles Dose modification: 420 mg (original dose 420 mg, Cycle 1, Reason: Provider Judgment) Administration: 420 mg (08/19/2019), 420 mg (09/08/2019) fosaprepitant (EMEND) 150 mg, dexamethasone (DECADRON) 12 mg in sodium chloride 0.9 % 145 mL IVPB, , Intravenous,  Once, 2 of 6 cycles Administration:  (08/19/2019),  (09/08/2019) trastuzumab-anns (KANJINTI) 750 mg in sodium chloride 0.9 % 250 mL chemo infusion, 756 mg (100 % of original dose 8 mg/kg), Intravenous,  Once, 2 of 6 cycles Dose modification: 8 mg/kg (original dose 8 mg/kg, Cycle 1, Reason: Other (see comments), Comment: change to approved brand), 6 mg/kg (original dose 6 mg/kg, Cycle 2, Reason: Other (see comments), Comment: brand change per insurance) Administration: 750 mg (08/19/2019), 567 mg (09/08/2019)  for chemotherapy treatment.      REVIEW OF SYSTEMS:   Constitutional: Denies fevers, chills or abnormal weight loss Eyes: Denies blurriness of vision Ears, nose, mouth, throat, and face: Denies mucositis or sore throat Respiratory: Denies cough, dyspnea or wheezes Cardiovascular: Denies palpitation, chest discomfort Gastrointestinal: Continued nausea with intermittent vomiting, mildly improved Skin: Denies abnormal skin rashes Lymphatics: Denies new lymphadenopathy or easy bruising Neurological:Denies numbness, tingling or new weaknesses Behavioral/Psych: Mood is stable, no new changes  Extremities: No lower extremity edema  All other systems were reviewed with the patient and are negative.  I have reviewed the past medical history,  past surgical history, social history and family history with the patient and they are unchanged from previous note.   PHYSICAL EXAMINATION: ECOG PERFORMANCE STATUS: 3 - Symptomatic, >50%  confined to bed  Vitals:   09/28/19 2324 09/29/19 0447  BP: 116/66 117/70  Pulse: (!) 59 (!) 58  Resp:  16  Temp:  98.7 F (37.1 C)  SpO2:  98%   Filed Weights   09/27/19 0612 09/28/19 0534 09/29/19 0447  Weight: 203 lb 0.7 oz (92.1 kg) 203 lb 7.8 oz (92.3 kg) 202 lb 13.2 oz (92 kg)    GENERAL:alert, no distress and comfortable SKIN: skin color, texture, turgor are normal, no rashes or significant lesions EYES: normal, Conjunctiva are pink and non-injected, sclera clear OROPHARYNX:no exudate, no erythema and lips, buccal mucosa, and tongue normal  NECK: supple, thyroid normal size, non-tender, without nodularity LYMPH:  no palpable lymphadenopathy in the cervical, axillary or inguinal LUNGS: clear to auscultation and percussion with normal breathing effort HEART: regular rate & rhythm and no murmurs and no lower extremity edema ABDOMEN: mild abdominal tenderness Musculoskeletal:no cyanosis of digits and no clubbing  NEURO: alert & oriented x 3 with fluent speech, no focal motor/sensory deficits  LABORATORY DATA:  I have reviewed the data as listed CMP Latest Ref Rng & Units 09/29/2019 09/27/2019 09/25/2019  Glucose 70 - 99 mg/dL 190(H) 167(H) 184(H)  BUN 8 - 23 mg/dL _0 Creatinine 0.44 - 1.00 mg/dL 2.22(H) 2.18(H) 2.10(H)  Sodium 135 - 145 mmol/L 133(L) 137 144  Potassium 3.5 - 5.1 mmol/L 3.9 3.9 4.7  Chloride 98 - 111 mmol/L 105 108 117(H)  CO2 22 - 32 mmol/L 20(L) 21(L) 20(L)  Calcium 8.9 - 10.3 mg/dL 7.8(L) 8.1(L) 8.2(L)  Total Protein 6.5 - 8.1 g/dL 4.9(L) - 4.9(L)  Total Bilirubin 0.3 - 1.2 mg/dL 0.6 - 0.2(L)  Alkaline Phos 38 - 126 U/L 77 - 99  AST 15 - 41 U/L 19 - 21  ALT 0 - 44 U/L 28 - 22    Lab Results  Component Value Date   WBC 7.8 09/29/2019   HGB 8.0 (L) 09/29/2019   HCT 25.0 (L) 09/29/2019   MCV 90.6 09/29/2019   PLT 100 (L) 09/29/2019   NEUTROABS 6.5 09/29/2019    ASSESSMENT AND PLAN: 1.  Intractable nausea vomiting due to chemotherapy:  She is getting around-the-clock Zofran and scheduled dexamethasone which is helping somewhat.  She has as needed Compazine, Phenergan, and Ativan available to her but not really taking these.  I discussed starting low-dose olanzapine 5 mg at bedtime for chemo-induced nausea and vomiting.  Adverse effects of been discussed with the patient and she is agreeable to try this.  I have also encouraged her to request a as needed antiemetic 30 to 45 minutes prior to eating.  She was again advised to get up to the chair and try to walk around as much as possible.  2.  Renal insufficiency: Creatinine is stable.  She is not taking in much by mouth.  Recommend continued IV fluids.  3.  Thrombocytopenia: Due to prior chemotherapy Normocytic anemia hemoglobin 8.0  Mikey Bussing, DNP, AGPCNP-BC, AOCNP  Attending Note  I agree with assessment and plan above.  Agree with adding olanzapine.

## 2019-09-29 NOTE — Progress Notes (Signed)
Physical Therapy Treatment Patient Details Name: [name] Foster MRN: [id number] DOB: 1950/10/16 Today's Date: 09/29/2019    History of Present Illness Pt is a 68 year old female with "PMH significant for but not limited too allergic rhinitis and asthma, history of CHF and coronary artery disease, hypertension, hyperlipidemia, myopathy, history of DVT on Coumadin, history of left-sided breast cancer currently on chemotherapy seeing Dr. Nicholas Lose, as well as other comorbidities who presents with a chief complaint of nausea vomiting as well as generalized weakness and fatigue.  Of note patient recently had chemotherapy on 09/08/2019 and had Neulasta on 09/09/2008."    PT Comments    Pt was able to increase gait today, but limited distance due to nausea.  Pt needing min guard for transfers and RW for gait.  Needs min cues for use of RW.  Making good progress, cont POC.    Follow Up Recommendations  Home health PT     Equipment Recommendations  Rolling walker with 5" wheels    Recommendations for Other Services       Precautions / Restrictions Precautions Precautions: Fall    Mobility  Bed Mobility Overal bed mobility: Needs Assistance Bed Mobility: Sit to Supine;Supine to Sit     Supine to sit: Supervision Sit to supine: Supervision   General bed mobility comments: extra time  Transfers Overall transfer level: Needs assistance Equipment used: Rolling walker (2 wheeled) Transfers: Sit to/from Stand Sit to Stand: Min guard         General transfer comment: sit to stand x 2; toielting ADLs with min guard for balance  Ambulation/Gait Ambulation/Gait assistance: Min guard Gait Distance (Feet): 15 Feet Assistive device: Rolling walker (2 wheeled) Gait Pattern/deviations: Decreased stride length Gait velocity: decreased   General Gait Details: 15'x2; mild unsteadiness but no LOB; declined further ambulation   Stairs             Wheelchair Mobility     Modified Rankin (Stroke Patients Only)       Balance Overall balance assessment: Needs assistance Sitting-balance support: No upper extremity supported;Feet supported Sitting balance-Leahy Scale: Normal     Standing balance support: During functional activity;Single extremity supported Standing balance-Leahy Scale: Fair                              Cognition Arousal/Alertness: Awake/alert Behavior During Therapy: WFL for tasks assessed/performed Overall Cognitive Status: Within Functional Limits for tasks assessed                                        Exercises      General Comments General comments (skin integrity, edema, etc.): vss; limited by nausea      Pertinent Vitals/Pain Pain Assessment: No/denies pain    Home Living                      Prior Function            PT Goals (current goals can now be found in the care plan section) Progress towards PT goals: Progressing toward goals    Frequency    Min 3X/week      PT Plan Current plan remains appropriate    Co-evaluation              AM-PAC PT "6 Clicks" Mobility   Outcome Measure  Help needed turning from your back to your side while in a flat bed without using bedrails?: None Help needed moving from lying on your back to sitting on the side of a flat bed without using bedrails?: None Help needed moving to and from a bed to a chair (including a wheelchair)?: None Help needed standing up from a chair using your arms (e.g., wheelchair or bedside chair)?: None Help needed to walk in hospital room?: None Help needed climbing 3-5 steps with a railing? : A Little 6 Click Score: 23    End of Session Equipment Utilized During Treatment: Gait belt Activity Tolerance: Other (comment)(limited by nausea) Patient left: in bed;with call bell/phone within reach;with bed alarm set Nurse Communication: Mobility status PT Visit Diagnosis: Difficulty in walking,  not elsewhere classified (R26.2);Muscle weakness (generalized) (M62.81)     Time: 1937-9024 PT Time Calculation (min) (ACUTE ONLY): 18 min  Charges:  $Gait Training: 8-22 mins                     Maggie Font, PT Acute Rehab Services Pager [phone] [facility] [phone] [facility] [place] 09/29/2019, 4:26 PM

## 2019-09-30 ENCOUNTER — Inpatient Hospital Stay: Payer: Medicare Other | Admitting: Hematology and Oncology

## 2019-09-30 ENCOUNTER — Inpatient Hospital Stay: Payer: Medicare Other

## 2019-09-30 LAB — PROTIME-INR
INR: 1.7 — ABNORMAL HIGH (ref 0.8–1.2)
Prothrombin Time: 19.6 seconds — ABNORMAL HIGH (ref 11.4–15.2)

## 2019-09-30 LAB — GLUCOSE, CAPILLARY: Glucose-Capillary: 104 mg/dL — ABNORMAL HIGH (ref 70–99)

## 2019-09-30 MED ORDER — ONDANSETRON HCL 4 MG PO TABS
8.0000 mg | ORAL_TABLET | Freq: Three times a day (TID) | ORAL | Status: DC
  Administered 2019-09-30 – 2019-10-03 (×10): 8 mg via ORAL
  Filled 2019-09-30 (×10): qty 2

## 2019-09-30 MED ORDER — WARFARIN SODIUM 5 MG PO TABS
5.0000 mg | ORAL_TABLET | Freq: Once | ORAL | Status: AC
  Administered 2019-09-30: 5 mg via ORAL
  Filled 2019-09-30: qty 1

## 2019-09-30 NOTE — Progress Notes (Addendum)
HEMATOLOGY-ONCOLOGY PROGRESS NOTE  SUBJECTIVE: Denies nausea this morning.  States she did not vomit at all yesterday.  She was able to eat some food for dinner last night and did not have any difficulty keeping it down.  Reports that she is tired this morning because she was woken up multiple times during the night.  Oncology History  Malignant neoplasm of overlapping sites of left breast in female, estrogen receptor negative ([place])  08/01/2019 Initial Diagnosis   Patient palpated left breast lump and skin thickening. Mammogram showed a 3.6cm mass at 3:30 position, a 0.9cm mass at 3:00 position, a 0.7cm mass at 2:00 position, a 0.5cm mass at 2:00 position, a 0.3cm mass at 1:00 position, and a 0.4cm mass at 1:00 position, with 4 abnormal left axillary lymph nodes. Biopsy showed IDC, grade 3, HER-2 + (3+), ER/PR -, Ki67 70%, in the breast and lymph nodes.    08/06/2019 Cancer Staging   Staging form: Breast, AJCC 8th Edition - Clinical: Stage IIIB (cT4, cN1, cM0, G3, ER-, PR-, HER2+) - Signed by Nicholas Lose, MD on 08/06/2019   08/19/2019 -  Chemotherapy   The patient had palonosetron (ALOXI) injection 0.25 mg, 0.25 mg, Intravenous,  Once, 2 of 6 cycles Administration: 0.25 mg (08/19/2019), 0.25 mg (09/08/2019) pegfilgrastim-cbqv (UDENYCA) injection 6 mg, 6 mg, Subcutaneous, Once, 2 of 6 cycles Administration: 6 mg (08/21/2019), 6 mg (09/10/2019) CARBOplatin (PARAPLATIN) 480 mg in sodium chloride 0.9 % 250 mL chemo infusion, 480 mg (110.9 % of original dose 429.6 mg), Intravenous,  Once, 2 of 6 cycles Dose modification:   (original dose 429.6 mg, Cycle 1) Administration: 480 mg (08/19/2019), 340 mg (09/08/2019) DOCEtaxel (TAXOTERE) 160 mg in sodium chloride 0.9 % 250 mL chemo infusion, 75 mg/m2 = 160 mg, Intravenous,  Once, 2 of 6 cycles Dose modification: 60 mg/m2 (original dose 75 mg/m2, Cycle 2, Reason: Dose not tolerated) Administration: 160 mg (08/19/2019), 130 mg (09/08/2019) pertuzumab  (PERJETA) 420 mg in sodium chloride 0.9 % 250 mL chemo infusion, 420 mg (100 % of original dose 420 mg), Intravenous, Once, 2 of 6 cycles Dose modification: 420 mg (original dose 420 mg, Cycle 1, Reason: Provider Judgment) Administration: 420 mg (08/19/2019), 420 mg (09/08/2019) fosaprepitant (EMEND) 150 mg, dexamethasone (DECADRON) 12 mg in sodium chloride 0.9 % 145 mL IVPB, , Intravenous,  Once, 2 of 6 cycles Administration:  (08/19/2019),  (09/08/2019) trastuzumab-anns (KANJINTI) 750 mg in sodium chloride 0.9 % 250 mL chemo infusion, 756 mg (100 % of original dose 8 mg/kg), Intravenous,  Once, 2 of 6 cycles Dose modification: 8 mg/kg (original dose 8 mg/kg, Cycle 1, Reason: Other (see comments), Comment: change to approved brand), 6 mg/kg (original dose 6 mg/kg, Cycle 2, Reason: Other (see comments), Comment: brand change per insurance) Administration: 750 mg (08/19/2019), 567 mg (09/08/2019)  for chemotherapy treatment.      REVIEW OF SYSTEMS:   Constitutional: Denies fevers, chills or abnormal weight loss Eyes: Denies blurriness of vision Ears, nose, mouth, throat, and face: Denies mucositis or sore throat Respiratory: Denies cough, dyspnea or wheezes Cardiovascular: Denies palpitation, chest discomfort Gastrointestinal: Denies nausea this morning.  No vomiting reported. Skin: Denies abnormal skin rashes Lymphatics: Denies new lymphadenopathy or easy bruising Neurological:Denies numbness, tingling or new weaknesses Behavioral/Psych: Mood is stable, no new changes  Extremities: No lower extremity edema  All other systems were reviewed with the patient and are negative.  I have reviewed the past medical history, past surgical history, social history and family history with the patient  and they are unchanged from previous note.   PHYSICAL EXAMINATION: ECOG PERFORMANCE STATUS: 3 - Symptomatic, >50% confined to bed  Vitals:   09/29/19 2340 09/30/19 0521  BP: 117/69 120/75  Pulse: (!)  55 61  Resp:  16  Temp:  97.8 F (36.6 C)  SpO2:  100%   Filed Weights   09/28/19 0534 09/29/19 0447 09/30/19 0500  Weight: 203 lb 7.8 oz (92.3 kg) 202 lb 13.2 oz (92 kg) 198 lb 10.2 oz (90.1 kg)    GENERAL:alert, no distress and comfortable SKIN: skin color, texture, turgor are normal, no rashes or significant lesions EYES: normal, Conjunctiva are pink and non-injected, sclera clear OROPHARYNX:no exudate, no erythema and lips, buccal mucosa, and tongue normal  NECK: supple, thyroid normal size, non-tender, without nodularity LYMPH:  no palpable lymphadenopathy in the cervical, axillary or inguinal LUNGS: clear to auscultation and percussion with normal breathing effort HEART: regular rate & rhythm and no murmurs and no lower extremity edema ABDOMEN: mild abdominal tenderness Musculoskeletal:no cyanosis of digits and no clubbing  NEURO: alert & oriented x 3 with fluent speech, no focal motor/sensory deficits  LABORATORY DATA:  I have reviewed the data as listed CMP Latest Ref Rng & Units 09/29/2019 09/27/2019 09/25/2019  Glucose 70 - 99 mg/dL 190(H) 167(H) 184(H)  BUN 8 - 23 mg/dL '18 20 20  ' Creatinine 0.44 - 1.00 mg/dL 2.22(H) 2.18(H) 2.10(H)  Sodium 135 - 145 mmol/L 133(L) 137 144  Potassium 3.5 - 5.1 mmol/L 3.9 3.9 4.7  Chloride 98 - 111 mmol/L 105 108 117(H)  CO2 22 - 32 mmol/L 20(L) 21(L) 20(L)  Calcium 8.9 - 10.3 mg/dL 7.8(L) 8.1(L) 8.2(L)  Total Protein 6.5 - 8.1 g/dL 4.9(L) - 4.9(L)  Total Bilirubin 0.3 - 1.2 mg/dL 0.6 - 0.2(L)  Alkaline Phos 38 - 126 U/L 77 - 99  AST 15 - 41 U/L 19 - 21  ALT 0 - 44 U/L 28 - 22    Lab Results  Component Value Date   WBC 7.8 09/29/2019   HGB 8.0 (L) 09/29/2019   HCT 25.0 (L) 09/29/2019   MCV 90.6 09/29/2019   PLT 100 (L) 09/29/2019   NEUTROABS 6.5 09/29/2019    ASSESSMENT AND PLAN: 1.  Intractable nausea vomiting due to chemotherapy: She is getting around-the-clock Zofran, scheduled dexamethasone, olanzapine 5 mg at bedtime.   She also has as needed Compazine, Phenergan, Ativan available to her but is not taking these.  This morning, her nausea has resolved and she is no longer vomiting.  She is able to take in some food without much difficulty. She was again advised to get up to the chair and try to walk around as much as possible.  If the patient is eating better, she has no further nausea and vomiting, and she is able to get up out of bed, she can be discharged home if otherwise medically stable.  2.  Renal insufficiency: Creatinine not checked this morning.  Hopefully, her renal function will continue to improve as she is able to take in more by mouth.  3.  Thrombocytopenia: CBC not resulted yet this morning.  Due to prior chemotherapy.  Mikey Bussing, DNP, AGPCNP-BC, AOCNP  Attending note: Nausea and vomiting improving.  Patient is being switched to oral antiemetics. She will need to go home on Zofran 3 times daily around-the-clock, olanzapine. Dexamethasone could be discontinued. Her chemo will need to be adjusted. We will follow her next week and determine the treatment plan. Thank you for  taking care of her.

## 2019-09-30 NOTE — Progress Notes (Signed)
OT Cancellation Note  Patient Details Name: [name] Foster MRN: [id number] DOB: 07-25-1951   Cancelled Treatment:    Reason Eval/Treat Not Completed: Other (comment).  PT just finished with pt and assisted her back to bed. Will reattempt tomorrow, if schedule permits.  Samanthajo Payano 09/30/2019, 3:36 PM  Karsten Ro, OTR/L Acute Rehabilitation Services 09/30/2019

## 2019-09-30 NOTE — Progress Notes (Signed)
Physical Therapy Treatment Patient Details Name: [name] Foster MRN: [id number] DOB: 03/12/1951 Today's Date: 09/30/2019    History of Present Illness Pt is a 68 year old female with "PMH significant for but not limited too allergic rhinitis and asthma, history of CHF and coronary artery disease, hypertension, hyperlipidemia, myopathy, history of DVT on Coumadin, history of left-sided breast cancer currently on chemotherapy seeing Dr. Nicholas Lose, as well as other comorbidities who presents with a chief complaint of nausea vomiting as well as generalized weakness and fatigue.  Of note patient recently had chemotherapy on 09/08/2019 and had Neulasta on 09/09/2008."    PT Comments    Progressing slowly with activity. Pt continues to experience nausea with activity.    Follow Up Recommendations  Home health PT     Equipment Recommendations  Rolling walker with 5" wheels    Recommendations for Other Services       Precautions / Restrictions Precautions Precautions: Fall Precaution Comments: nauseous with movement Restrictions Weight Bearing Restrictions: No    Mobility  Bed Mobility Overal bed mobility: Needs Assistance Bed Mobility: Supine to Sit;Sit to Supine     Supine to sit: Supervision;HOB elevated Sit to supine: Supervision;HOB elevated   General bed mobility comments: extra time  Transfers Overall transfer level: Needs assistance Equipment used: Rolling walker (2 wheeled) Transfers: Sit to/from Stand Sit to Stand: Min guard         General transfer comment: Close guard for safety. Increased time.  Ambulation/Gait Ambulation/Gait assistance: Min guard Gait Distance (Feet): 50 Feet Assistive device: Rolling walker (2 wheeled) Gait Pattern/deviations: Step-through pattern;Decreased stride length     General Gait Details: Close guard for safety. Slow gait speed. Pt reported some nausea with ambulation   Stairs             Wheelchair Mobility     Modified Rankin (Stroke Patients Only)       Balance                                            Cognition Arousal/Alertness: Awake/alert Behavior During Therapy: WFL for tasks assessed/performed Overall Cognitive Status: Within Functional Limits for tasks assessed                                        Exercises      General Comments        Pertinent Vitals/Pain Pain Assessment: No/denies pain    Home Living                      Prior Function            PT Goals (current goals can now be found in the care plan section) Progress towards PT goals: Progressing toward goals    Frequency    Min 3X/week      PT Plan Current plan remains appropriate    Co-evaluation              AM-PAC PT "6 Clicks" Mobility   Outcome Measure  Help needed turning from your back to your side while in a flat bed without using bedrails?: A Little Help needed moving from lying on your back to sitting on the side of a flat bed without using bedrails?: A Little Help needed  moving to and from a bed to a chair (including a wheelchair)?: A Little Help needed standing up from a chair using your arms (e.g., wheelchair or bedside chair)?: A Little Help needed to walk in hospital room?: A Little Help needed climbing 3-5 steps with a railing? : A Little 6 Click Score: 18    End of Session   Activity Tolerance: Patient tolerated treatment well(but with some nausea) Patient left: in bed;with call bell/phone within reach   PT Visit Diagnosis: Difficulty in walking, not elsewhere classified (R26.2);Muscle weakness (generalized) (M62.81)     Time: 7897-8478 PT Time Calculation (min) (ACUTE ONLY): 16 min  Charges:  $Gait Training: 8-22 mins                         Doreatha Massed, PT Acute Rehabilitation

## 2019-09-30 NOTE — Progress Notes (Signed)
PROGRESS NOTE  [name] Foster ONG:[id number] DOB: 1951/05/26 DOA: 09/21/2019 PCP: [name] Medin, MD   LOS: 9 days   Brief Narrative / Interim history: 68 year old female with history of HTN, CAD, asthma, DVT on Coumadin, breast cancer currently on chemotherapy who came to the hospital on 12/20 due to nausea, vomiting, generalized weakness that have been progressive since her last chemotherapy.  She was found to be dehydrated and was admitted to the hospital.  CT scan of the abdomen and pelvis did not show any intra-abdominal pathology  Subjective / 24h Interval events: No further nausea or vomiting but she is afraid to eat, barely taking a few bites at a time and not having enough oral intake  Assessment & Plan: Principal Problem Intractable nausea/vomiting, poor oral intake -Last chemotherapy 09/08/2019, started having nausea and vomiting 4 days later.  Imaging on admission without any acute intra-abdominal pathology to explain her symptoms -Symptoms somewhat difficult to manage but eventually now seem to be under control for the past 24 hours with scheduled IV Zofran, Decadron -Transition to oral scheduled Zofran today and closely monitor, if she is stable on oral may be able to go home tomorrow -Still unable to tolerate more than a few sips at a time  Active Problems Hyponatremia, acute kidney injury on chronic kidney disease stage III -Likely due to poor p.o. intake, dehydration, sodium 156 on admission and now normalized -Baseline creatinine 1.4-1.8 in 2020, currently still elevated 2.2 but overall stable over the last several days.  Repeat tomorrow -Avoid nephrotoxic agents, continue to monitor  Generalized weakness -PT recommending home health PT, will arrange  Leukocytosis -Probably due to Neulasta, gradually normalizing  Thrombocytopenia -Platelets went down as low as 44, now recovering and 100 K this morning and Coumadin was added back  Possible renal cell  carcinoma -On CT scan on admission, will be followed as an outpatient  Breast cancer -Follows with oncology as an outpatient  HTN/CAD/chronic diastolic CHF -Continue Norvasc, statin, metoprolol.  Ramipril and furosemide are on hold.  Stop fluids today  History of DVT -Platelet counts recovered, resume Coumadin.  INR subtherapeutic 1.7  History of asthma/bronchitis -No wheezing, stable  Scheduled Meds: . amLODipine  10 mg Oral Daily  . atorvastatin  20 mg Oral q1800  . Chlorhexidine Gluconate Cloth  6 each Topical Daily  . cholecalciferol  2,000 Units Oral Daily  . dexamethasone  8 mg Intravenous Q24H  . feeding supplement (ENSURE ENLIVE)  237 mL Oral BID BM  . fluticasone  2 spray Each Nare Daily  . hydrALAZINE  50 mg Oral TID  . loratadine  10 mg Oral Daily  . magic mouthwash  5 mL Oral TID  . metoprolol tartrate  50 mg Oral BID  . OLANZapine  5 mg Oral QHS  . ondansetron  8 mg Oral Q8H  . pantoprazole (PROTONIX) IV  40 mg Intravenous Q12H  . sodium chloride flush  10-40 mL Intracatheter Q12H  . warfarin  5 mg Oral ONCE-1800  . Warfarin - Pharmacist Dosing Inpatient   Does not apply q1800   Continuous Infusions:  PRN Meds:.acetaminophen **OR** acetaminophen, albuterol, bisacodyl, LORazepam, ondansetron, polyethylene glycol, polyvinyl alcohol, prochlorperazine, prochlorperazine, promethazine, sodium chloride, sodium chloride flush  DVT prophylaxis: Started Coumadin Code Status: Full code Family Communication: Discussed with patient Disposition Plan: Home 24 hours if she no longer requires intravenous antiemetics and able to have p.o. intake  Consultants:  Oncology  Procedures:  None   Microbiology  None   Antimicrobials:  None     Objective: Vitals:   09/29/19 2340 09/30/19 0500 09/30/19 0521 09/30/19 0931  BP: 117/69  120/75   Pulse: (!) 55  61   Resp:   16   Temp:   97.8 F (36.6 C)   TempSrc:   Oral   SpO2:   100%   Weight:  90.1 kg  89.9 kg   Height:    5\' 5"  (1.651 m)    Intake/Output Summary (Last 24 hours) at 09/30/2019 1136 Last data filed at 09/29/2019 1931 Gross per 24 hour  Intake 835 ml  Output --  Net 835 ml   Filed Weights   09/29/19 0447 09/30/19 0500 09/30/19 0931  Weight: 92 kg 90.1 kg 89.9 kg    Examination:  Constitutional: NAD Eyes: No scleral icterus ENMT: Moist mucous membranes Neck: normal, supple Respiratory: Clear to auscultation bilaterally, no wheezing, no crackles, Cardiovascular: Regular rate and rhythm, no murmurs Abdomen: Soft, slightly tender to palpation and uncomfortable, bowel sounds positive Musculoskeletal: no clubbing / cyanosis.  Skin: No rashes seen Neurologic: No focal deficits Psychiatric: Normal judgment and insight. Alert and oriented x 3. Normal mood.    Data Reviewed: I have independently reviewed following labs and imaging studies   CBC: Recent Labs  Lab 09/24/19 0450 09/25/19 0426 09/27/19 0451 09/29/19 0259  WBC 13.8* 14.7* 12.0* 7.8  NEUTROABS  --   --  10.7* 6.5  HGB 8.7* 8.5* 8.8* 8.0*  HCT 30.0* 27.9* 28.4* 25.0*  MCV 98.7 97.2 92.8 90.6  PLT 44* 44* 71* 944*   Basic Metabolic Panel: Recent Labs  Lab 09/24/19 0450 09/25/19 0426 09/27/19 0451 09/29/19 0259  NA 151* 144 137 133*  K 3.4* 4.7 3.9 3.9  CL 125* 117* 108 105  CO2 21* 20* 21* 20*  GLUCOSE 129* 184* 167* 190*  BUN 24* 20 20 18   CREATININE 2.23* 2.10* 2.18* 2.22*  CALCIUM 8.1* 8.2* 8.1* 7.8*  MG  --  1.9 1.8  --   PHOS  --  2.4* 3.5  --    Liver Function Tests: Recent Labs  Lab 09/24/19 0450 09/25/19 0426 09/29/19 0259  AST 22 21 19   ALT 20 22 28   ALKPHOS 100 99 77  BILITOT 0.3 0.2* 0.6  PROT 5.0* 4.9* 4.9*  ALBUMIN 2.6* 2.6* 2.7*   Coagulation Profile: Recent Labs  Lab 09/26/19 0415 09/27/19 0451 09/28/19 0435 09/29/19 0259 09/30/19 0439  INR 2.3* 2.2* 2.2* 1.7* 1.7*   HbA1C: No results for input(s): HGBA1C in the last 72 hours. CBG: Recent Labs  Lab  09/26/19 0801 09/27/19 0829 09/28/19 0759 09/29/19 0747 09/30/19 0807  GLUCAP 126* 151* 130* 129* 104*    Recent Results (from the past 240 hour(s))  Culture, blood (routine x 2)     Status: None   Collection Time: 09/21/19  1:39 PM   Specimen: BLOOD LEFT ARM  Result Value Ref Range Status   Specimen Description   Final    BLOOD LEFT ARM Performed at [facility] [address], [place], [place] [address]    Special Requests   Final    BOTTLES DRAWN AEROBIC AND ANAEROBIC Blood Culture adequate volume Performed at [facility] [address] [place], [place] [address]    Culture   Final    NO GROWTH 5 DAYS Performed at [facility], [place] [address] [place], [place] [address]    Report Status 09/26/2019 FINAL  Final  SARS CORONAVIRUS 2 (TAT  6-24 HRS) Nasopharyngeal Nasopharyngeal Swab     Status: None   Collection Time: 09/21/19  3:00 PM   Specimen: Nasopharyngeal Swab  Result Value Ref Range Status   SARS Coronavirus 2 NEGATIVE NEGATIVE Final    Comment: (NOTE) SARS-CoV-2 target nucleic acids are NOT DETECTED. The SARS-CoV-2 RNA is generally detectable in upper and lower respiratory specimens during the acute phase of infection. Negative results do not preclude SARS-CoV-2 infection, do not rule out co-infections with other pathogens, and should not be used as the sole basis for treatment or other patient management decisions. Negative results must be combined with clinical observations, patient history, and epidemiological information. The expected result is Negative. Fact Sheet for Patients: SugarRoll.be Fact Sheet for Healthcare Providers: https://www.woods-mathews.com/ This test is not yet approved or cleared by the [place] FDA and  has been authorized for detection and/or diagnosis of SARS-CoV-2 by FDA under an Emergency Use Authorization (EUA). This EUA will remain  in  effect (meaning this test can be used) for the duration of the COVID-19 declaration under Section 56 4(b)(1) of the Act, 21 U.S.C. section 360bbb-3(b)(1), unless the authorization is terminated or revoked sooner. Performed at [facility], [place] [address] [place], [place] [address]   Culture, blood (routine x 2)     Status: None   Collection Time: 09/21/19  5:52 PM   Specimen: Porta Cath; Blood  Result Value Ref Range Status   Specimen Description   Final    PORTA CATH Performed at [facility] [address] [place], [place] [address]    Special Requests   Final    BOTTLES DRAWN AEROBIC AND ANAEROBIC Blood Culture adequate volume Performed at [facility] [address] [place], [place] [address]    Culture   Final    NO GROWTH 5 DAYS Performed at [facility], [place] [address] [place], [place] [address]    Report Status 09/26/2019 FINAL  Final     Radiology Studies: No results found.  Marzetta Board, MD, PhD [facility]  Between 7 am - 7 pm I am available, please contact me via Amion or Securechat  Between 7 pm - 7 am I am not available, please contact night coverage MD/APP via Amion

## 2019-09-30 NOTE — Progress Notes (Signed)
[place] for warfarin Indication: VTE treatment  Allergies  Allergen Reactions  . Penicillins Rash    Did it involve swelling of the face/tongue/throat, SOB, or low BP? Unknown Did it involve sudden or severe rash/hives, skin peeling, or any reaction on the inside of your mouth or nose? Yes Did you need to seek medical attention at a hospital or doctor's office? Yes When did it last happen?in her 24s If all above answers are "NO", may proceed with cephalosporin use.   . Tetanus Toxoid Rash    Caused cellulitis   . Aspirin Rash   Patient Measurements: Height: 5\' 5"  (165.1 cm) Weight: 198 lb 10.2 oz (90.1 kg) IBW/kg (Calculated) : 57 Heparin Dosing Weight:   Vital Signs: Temp: 97.8 F (36.6 C) (12/29 0521) Temp Source: Oral (12/29 0521) BP: 120/75 (12/29 0521) Pulse Rate: 61 (12/29 0521)  Labs: Recent Labs    09/28/19 0435 09/29/19 0259 09/30/19 0439  HGB  --  8.0*  --   HCT  --  25.0*  --   PLT  --  100*  --   LABPROT 24.2* 19.9* 19.6*  INR 2.2* 1.7* 1.7*  CREATININE  --  2.22*  --    Estimated Creatinine Clearance: 27.3 mL/min (A) (by C-G formula based on SCr of 2.22 mg/dL (H)).  Medical History: Past Medical History:  Diagnosis Date  . Allergic rhinitis   . Aortic valve disorders   . Arthritis   . Asthma   . CHF (congestive heart failure) ([place])   . Coronary artery disease   . Family history of adverse reaction to anesthesia    difficulty waking mother up after surgery  . Family history of breast cancer   . Family history of cervical cancer   . Family history of colon cancer   . Family history of throat cancer   . Heart murmur   . Hyperlipidemia   . Hypertension   . Long term (current) use of anticoagulants   . Other primary cardiomyopathies   . Personal history of colonic polyps   . Personal history of venous thrombosis and embolism     Medications:  Scheduled:  . amLODipine  10 mg Oral Daily  .  atorvastatin  20 mg Oral q1800  . Chlorhexidine Gluconate Cloth  6 each Topical Daily  . cholecalciferol  2,000 Units Oral Daily  . dexamethasone  8 mg Intravenous Q24H  . feeding supplement (ENSURE ENLIVE)  237 mL Oral BID BM  . fluticasone  2 spray Each Nare Daily  . hydrALAZINE  50 mg Oral TID  . loratadine  10 mg Oral Daily  . magic mouthwash  5 mL Oral TID  . metoprolol tartrate  50 mg Oral BID  . OLANZapine  5 mg Oral QHS  . pantoprazole (PROTONIX) IV  40 mg Intravenous Q12H  . sodium chloride flush  10-40 mL Intracatheter Q12H  . Warfarin - Pharmacist Dosing Inpatient   Does not apply q1800   Assessment: 89 yoF on chronic warfarin for hx of VTE.  Last dose > 7 days prior to admit per med rec.  INR 1.1 on admit. Home dose 5mg  daily exc 2.5mg  qWed. Coumadin has been held due to low plts. Now Coumadin restarting on 12/28. INR remains at 1.7. Hgb 8, plts up to 100.  Goal of Therapy:  INR 2-3    Plan:  Give Coumadin 5mg  PO x 1 Monitor daily INR, CBC, s/s of bleed  Elenor Quinones,  PharmD, BCPS, BCIDP Clinical Pharmacist 09/30/2019 8:19 AM

## 2019-10-01 LAB — COMPREHENSIVE METABOLIC PANEL
ALT: 25 U/L (ref 0–44)
AST: 14 U/L — ABNORMAL LOW (ref 15–41)
Albumin: 2.7 g/dL — ABNORMAL LOW (ref 3.5–5.0)
Alkaline Phosphatase: 64 U/L (ref 38–126)
Anion gap: 6 (ref 5–15)
BUN: 20 mg/dL (ref 8–23)
CO2: 21 mmol/L — ABNORMAL LOW (ref 22–32)
Calcium: 7.6 mg/dL — ABNORMAL LOW (ref 8.9–10.3)
Chloride: 100 mmol/L (ref 98–111)
Creatinine, Ser: 2.18 mg/dL — ABNORMAL HIGH (ref 0.44–1.00)
GFR calc Af Amer: 26 mL/min — ABNORMAL LOW (ref >60)
GFR calc non Af Amer: 23 mL/min — ABNORMAL LOW (ref >60)
Glucose, Bld: 155 mg/dL — ABNORMAL HIGH (ref 70–99)
Potassium: 3.8 mmol/L (ref 3.5–5.1)
Sodium: 127 mmol/L — ABNORMAL LOW (ref 135–145)
Total Bilirubin: 0.6 mg/dL (ref 0.3–1.2)
Total Protein: 4.9 g/dL — ABNORMAL LOW (ref 6.5–8.1)

## 2019-10-01 LAB — CBC
HCT: 25.7 % — ABNORMAL LOW (ref 36.0–46.0)
Hemoglobin: 8.2 g/dL — ABNORMAL LOW (ref 12.0–15.0)
MCH: 29.5 pg (ref 26.0–34.0)
MCHC: 31.9 g/dL (ref 30.0–36.0)
MCV: 92.4 fL (ref 80.0–100.0)
Platelets: 134 10*3/uL — ABNORMAL LOW (ref 150–400)
RBC: 2.78 MIL/uL — ABNORMAL LOW (ref 3.87–5.11)
RDW: 16.1 % — ABNORMAL HIGH (ref 11.5–15.5)
WBC: 6.4 10*3/uL (ref 4.0–10.5)
nRBC: 0 % (ref 0.0–0.2)

## 2019-10-01 LAB — GLUCOSE, CAPILLARY: Glucose-Capillary: 100 mg/dL — ABNORMAL HIGH (ref 70–99)

## 2019-10-01 LAB — PROTIME-INR
INR: 1.8 — ABNORMAL HIGH (ref 0.8–1.2)
Prothrombin Time: 20.8 seconds — ABNORMAL HIGH (ref 11.4–15.2)

## 2019-10-01 MED ORDER — FUROSEMIDE 10 MG/ML IJ SOLN
40.0000 mg | Freq: Once | INTRAMUSCULAR | Status: AC
  Administered 2019-10-01: 40 mg via INTRAVENOUS
  Filled 2019-10-01: qty 4

## 2019-10-01 MED ORDER — FUROSEMIDE 40 MG PO TABS
40.0000 mg | ORAL_TABLET | Freq: Every day | ORAL | Status: DC
  Administered 2019-10-02 – 2019-10-03 (×2): 40 mg via ORAL
  Filled 2019-10-01 (×2): qty 1

## 2019-10-01 MED ORDER — WARFARIN SODIUM 5 MG PO TABS
5.0000 mg | ORAL_TABLET | Freq: Once | ORAL | Status: AC
  Administered 2019-10-01: 5 mg via ORAL
  Filled 2019-10-01: qty 1

## 2019-10-01 NOTE — Consult Note (Signed)
   [facility] CM Inpatient Consult   10/01/2019  Alyzae Hawkey 1951/07/09 [id number]    Referral received from Inpatient transition of care CM through [place] (care management assistant) with reason for consult: cancer diagnosis on chemo.  Noted that [facility]) [facility] hospital liaison had already reviewed patient today per consult note.  Plan: Will continue to follow for progression, needs and disposition plans.   For questions and additional information, please call:  Josejulian Tarango A. Rossi Burdo, BSN, RN-BC [facility] Liaison Cell: [phone]

## 2019-10-01 NOTE — Progress Notes (Signed)
PROGRESS NOTE  [name] Foster LXB:[id number] DOB: Jul 27, 1951 DOA: 09/21/2019 PCP: Burnis Medin, MD   LOS: 10 days   Brief Narrative / Interim history: 68 year old female with history of HTN, CAD, asthma, DVT on Coumadin, breast cancer currently on chemotherapy who came to the hospital on 12/20 due to nausea, vomiting, generalized weakness that have been progressive since her last chemotherapy.  She was found to be dehydrated and hypernatremic and was admitted to the hospital.  CT scan of the abdomen and pelvis did not show any intra-abdominal pathology.  She was placed on scheduled IV antiemetics with improvement in her symptoms.  She was also placed on IV fluids with improvement in her sodium, IV fluids have been discontinued 12/29 once sodium was in the 130 range however progressed to become hyponatremic to 127 on 12/30.  Slight fluid overload, will monitor with Lasix  Subjective / 24h Interval events: She is feeling well, able to eat a little bit more last night.  Tolerated well scheduled oral Zofran.  No chest pain, no shortness of breath.  Assessment & Plan: Principal Problem Intractable nausea/vomiting, poor oral intake -Last chemotherapy 09/08/2019, started having nausea and vomiting 4 days later.  Imaging on admission without any acute intra-abdominal pathology to explain her symptoms -Symptoms somewhat difficult to manage but were eventually controlled with scheduled intravenous Zofran.  On 12/29 for intravenous Zofran has been changed to oral remaining scheduled -Overall improving  Active Problems Hypernatremia, acute kidney injury on chronic kidney disease stage III -Likely due to poor p.o. intake, dehydration, sodium 156 on admission and now normalized -Baseline creatinine 1.4-1.8 in 2020 -Patient complained of slight swelling in her legs on 12/29 after fluids were discontinued at that time.  Her sodium actually got slightly worse today at 127, probably due to a degree of fluid  overload and patient has also been drinking plenty of water -She is on Lasix at home, will give 40 mg IV x1 today and resume oral Lasix from tomorrow. -If sodium stable tomorrow 127 or increasing, probably stable to go home  Generalized weakness -PT recommending home health PT  Leukocytosis -Probably due to Neulasta, gradually normalizing  Thrombocytopenia -Platelets went down as low as 44, now recovering   Possible renal cell carcinoma -On CT scan on admission, will be followed as an outpatient  Breast cancer -Follows with oncology as an outpatient  HTN/CAD/chronic diastolic CHF -Continue Norvasc, statin, metoprolol.  Back on Lasix  History of DVT -Platelet counts recovered, resumed Coumadin per pharmacy  History of asthma/bronchitis -No wheezing, stable  Scheduled Meds: . amLODipine  10 mg Oral Daily  . atorvastatin  20 mg Oral q1800  . Chlorhexidine Gluconate Cloth  6 each Topical Daily  . cholecalciferol  2,000 Units Oral Daily  . dexamethasone  8 mg Intravenous Q24H  . feeding supplement (ENSURE ENLIVE)  237 mL Oral BID BM  . fluticasone  2 spray Each Nare Daily  . [START ON 10/02/2019] furosemide  40 mg Oral Daily  . hydrALAZINE  50 mg Oral TID  . loratadine  10 mg Oral Daily  . magic mouthwash  5 mL Oral TID  . metoprolol tartrate  50 mg Oral BID  . OLANZapine  5 mg Oral QHS  . ondansetron  8 mg Oral Q8H  . pantoprazole (PROTONIX) IV  40 mg Intravenous Q12H  . sodium chloride flush  10-40 mL Intracatheter Q12H  . warfarin  5 mg Oral ONCE-1800  . Warfarin - Pharmacist Dosing Inpatient   Does  not apply q1800   Continuous Infusions:  PRN Meds:.acetaminophen **OR** acetaminophen, albuterol, bisacodyl, LORazepam, ondansetron, polyethylene glycol, polyvinyl alcohol, prochlorperazine, prochlorperazine, promethazine, sodium chloride, sodium chloride flush  DVT prophylaxis: Started Coumadin Code Status: Full code Family Communication: Discussed with  patient Disposition Plan: Home 12/31 if sodium stabilizes  Consultants:  Oncology  Procedures:  None   Microbiology  None   Antimicrobials: None     Objective: Vitals:   09/30/19 0931 09/30/19 1447 09/30/19 2015 10/01/19 0427  BP:  127/71 117/71 131/75  Pulse:  67 64 (!) 55  Resp:  18 19 18   Temp:  98 F (36.7 C) 98.3 F (36.8 C) 97.8 F (36.6 C)  TempSrc:  Oral    SpO2:  98% 97% 100%  Weight: 89.9 kg     Height: 5\' 5"  (1.651 m)       Intake/Output Summary (Last 24 hours) at 10/01/2019 1113 Last data filed at 09/30/2019 1448 Gross per 24 hour  Intake 355 ml  Output --  Net 355 ml   Filed Weights   09/29/19 0447 09/30/19 0500 09/30/19 0931  Weight: 92 kg 90.1 kg 89.9 kg    Examination:  Constitutional: No distress, comfortable Eyes: No scleral icterus ENMT: Moist mucous membranes Neck: normal, supple Respiratory: Clear bilaterally, no wheezing, no crackles Cardiovascular: Regular rate and rhythm, no murmurs appreciated Abdomen: Soft, nontender, nondistended, bowel sounds positive Musculoskeletal: no clubbing / cyanosis.  Skin: No rashes seen Neurologic: No focal deficits, equal strength Psychiatric: Normal judgment and insight. Alert and oriented x 3. Normal mood.    Data Reviewed: I have independently reviewed following labs and imaging studies   CBC: Recent Labs  Lab 09/25/19 0426 09/27/19 0451 09/29/19 0259 10/01/19 0318  WBC 14.7* 12.0* 7.8 6.4  NEUTROABS  --  10.7* 6.5  --   HGB 8.5* 8.8* 8.0* 8.2*  HCT 27.9* 28.4* 25.0* 25.7*  MCV 97.2 92.8 90.6 92.4  PLT 44* 71* 100* 703*   Basic Metabolic Panel: Recent Labs  Lab 09/25/19 0426 09/27/19 0451 09/29/19 0259 10/01/19 0318  NA 144 137 133* 127*  K 4.7 3.9 3.9 3.8  CL 117* 108 105 100  CO2 20* 21* 20* 21*  GLUCOSE 184* 167* 190* 155*  BUN 20 20 18 20   CREATININE 2.10* 2.18* 2.22* 2.18*  CALCIUM 8.2* 8.1* 7.8* 7.6*  MG 1.9 1.8  --   --   PHOS 2.4* 3.5  --   --    Liver  Function Tests: Recent Labs  Lab 09/25/19 0426 09/29/19 0259 10/01/19 0318  AST 21 19 14*  ALT 22 28 25   ALKPHOS 99 77 64  BILITOT 0.2* 0.6 0.6  PROT 4.9* 4.9* 4.9*  ALBUMIN 2.6* 2.7* 2.7*   Coagulation Profile: Recent Labs  Lab 09/27/19 0451 09/28/19 0435 09/29/19 0259 09/30/19 0439 10/01/19 0318  INR 2.2* 2.2* 1.7* 1.7* 1.8*   HbA1C: No results for input(s): HGBA1C in the last 72 hours. CBG: Recent Labs  Lab 09/27/19 0829 09/28/19 0759 09/29/19 0747 09/30/19 0807 10/01/19 0750  GLUCAP 151* 130* 129* 104* 100*    Recent Results (from the past 240 hour(s))  Culture, blood (routine x 2)     Status: None   Collection Time: 09/21/19  1:39 PM   Specimen: BLOOD LEFT ARM  Result Value Ref Range Status   Specimen Description   Final    BLOOD LEFT ARM Performed at [facility] [address], [place], [place] [address]    Special Requests  Final    BOTTLES DRAWN AEROBIC AND ANAEROBIC Blood Culture adequate volume Performed at [facility] [address], [place], [place] [address]    Culture   Final    NO GROWTH 5 DAYS Performed at [facility], [place] [address], [place], [place] [address]    Report Status 09/26/2019 FINAL  Final  SARS CORONAVIRUS 2 (TAT 6-24 HRS) Nasopharyngeal Nasopharyngeal Swab     Status: None   Collection Time: 09/21/19  3:00 PM   Specimen: Nasopharyngeal Swab  Result Value Ref Range Status   SARS Coronavirus 2 NEGATIVE NEGATIVE Final    Comment: (NOTE) SARS-CoV-2 target nucleic acids are NOT DETECTED. The SARS-CoV-2 RNA is generally detectable in upper and lower respiratory specimens during the acute phase of infection. Negative results do not preclude SARS-CoV-2 infection, do not rule out co-infections with other pathogens, and should not be used as the sole basis for treatment or other patient management decisions. Negative results must be combined with clinical observations, patient  history, and epidemiological information. The expected result is Negative. Fact Sheet for Patients: SugarRoll.be Fact Sheet for Healthcare Providers: https://www.woods-mathews.com/ This test is not yet approved or cleared by the [place] FDA and  has been authorized for detection and/or diagnosis of SARS-CoV-2 by FDA under an Emergency Use Authorization (EUA). This EUA will remain  in effect (meaning this test can be used) for the duration of the COVID-19 declaration under Section 56 4(b)(1) of the Act, 21 U.S.C. section 360bbb-3(b)(1), unless the authorization is terminated or revoked sooner. Performed at [facility], [place] [address] [place], [place] [address]   Culture, blood (routine x 2)     Status: None   Collection Time: 09/21/19  5:52 PM   Specimen: Porta Cath; Blood  Result Value Ref Range Status   Specimen Description   Final    PORTA CATH Performed at [facility] [address] [place], [place] [address]    Special Requests   Final    BOTTLES DRAWN AEROBIC AND ANAEROBIC Blood Culture adequate volume Performed at [facility] [address], [place], [place] [address]    Culture   Final    NO GROWTH 5 DAYS Performed at [facility], [place] [address] [place], [place] [address]    Report Status 09/26/2019 FINAL  Final     Radiology Studies: No results found.  Marzetta Board, MD, PhD [facility]  Between 7 am - 7 pm I am available, please contact me via Amion or Securechat  Between 7 pm - 7 am I am not available, please contact night coverage MD/APP via Amion

## 2019-10-01 NOTE — Consult Note (Signed)
   [facility] CM Inpatient Consult   10/01/2019  [name] Foster December 06, 1950 [id number]   Patient screened for potential [facility] Care Management services due to high unplanned readmission risk score of 37%. Patient is member of [employer] under [employer].  Will continue to follow for progression and disposition plans.  Of note, [facility] Care Management services does not replace or interfere with any services that are arranged by inpatient case management or social work.   Netta Cedars, MSN, [facility] Liaison Nurse Mobile Phone [phone]  Toll free office [phone]

## 2019-10-01 NOTE — Progress Notes (Signed)
[place] for warfarin Indication: VTE treatment  Allergies  Allergen Reactions  . Penicillins Rash    Did it involve swelling of the face/tongue/throat, SOB, or low BP? Unknown Did it involve sudden or severe rash/hives, skin peeling, or any reaction on the inside of your mouth or nose? Yes Did you need to seek medical attention at a hospital or doctor's office? Yes When did it last happen?in her 69s If all above answers are "NO", may proceed with cephalosporin use.   . Tetanus Toxoid Rash    Caused cellulitis   . Aspirin Rash   Patient Measurements: Height: 5\' 5"  (165.1 cm)(patient stated she was around 5'5" ) Weight: 198 lb 3.5 oz (89.9 kg) IBW/kg (Calculated) : 57 Heparin Dosing Weight:   Vital Signs: Temp: 97.8 F (36.6 C) (12/30 0427) BP: 131/75 (12/30 0427) Pulse Rate: 55 (12/30 0427)  Labs: Recent Labs    09/29/19 0259 09/30/19 0439 10/01/19 0318  HGB 8.0*  --  8.2*  HCT 25.0*  --  25.7*  PLT 100*  --  134*  LABPROT 19.9* 19.6* 20.8*  INR 1.7* 1.7* 1.8*  CREATININE 2.22*  --  2.18*   Estimated Creatinine Clearance: 27.8 mL/min (A) (by C-G formula based on SCr of 2.18 mg/dL (H)).  Medical History: Past Medical History:  Diagnosis Date  . Allergic rhinitis   . Aortic valve disorders   . Arthritis   . Asthma   . CHF (congestive heart failure) ([place])   . Coronary artery disease   . Family history of adverse reaction to anesthesia    difficulty waking mother up after surgery  . Family history of breast cancer   . Family history of cervical cancer   . Family history of colon cancer   . Family history of throat cancer   . Heart murmur   . Hyperlipidemia   . Hypertension   . Long term (current) use of anticoagulants   . Other primary cardiomyopathies   . Personal history of colonic polyps   . Personal history of venous thrombosis and embolism     Medications:  Scheduled:  . amLODipine  10 mg Oral Daily   . atorvastatin  20 mg Oral q1800  . Chlorhexidine Gluconate Cloth  6 each Topical Daily  . cholecalciferol  2,000 Units Oral Daily  . dexamethasone  8 mg Intravenous Q24H  . feeding supplement (ENSURE ENLIVE)  237 mL Oral BID BM  . fluticasone  2 spray Each Nare Daily  . furosemide  40 mg Intravenous Once  . [START ON 10/02/2019] furosemide  40 mg Oral Daily  . hydrALAZINE  50 mg Oral TID  . loratadine  10 mg Oral Daily  . magic mouthwash  5 mL Oral TID  . metoprolol tartrate  50 mg Oral BID  . OLANZapine  5 mg Oral QHS  . ondansetron  8 mg Oral Q8H  . pantoprazole (PROTONIX) IV  40 mg Intravenous Q12H  . sodium chloride flush  10-40 mL Intracatheter Q12H  . Warfarin - Pharmacist Dosing Inpatient   Does not apply q1800   Assessment: 79 yoF on chronic warfarin for hx of VTE.  Last dose > 7 days prior to admit per med rec.  INR 1.1 on admit. Home dose 5mg  daily exc 2.5mg  qWed. Coumadin has been held due to low plts. hx DVT on warfarin PTA--INR 1.1 on admit.  LD > 7 days prior to admit. Restarted Coumadin on 12/28. INR 1.8. Plts  up to 134  Goal of Therapy:  INR 2-3    Plan:  Give Coumadin 5mg  PO x 1 Monitor daily INR, CBC, s/s of bleed  Elenor Quinones, PharmD, BCPS, BCIDP Clinical Pharmacist 10/01/2019 8:49 AM

## 2019-10-01 NOTE — TOC Initial Note (Signed)
Transition of Care [facility]) - Initial/Assessment Note    Patient Details  Name: [name] Foster MRN: [id number] Date of Birth: 03-13-1951  [facility]) CM/SW Contact:    Lia Hopping, [place] Phone Number: 10/01/2019, 3:13 PM  Clinical Narrative:            Patient admitted for nausea and vomiting; generalized weakness and fatigue.   CSW met with the patient to discuss home health. Patient declined home heath, "I can do my own exercise at home." Patient lives in the home with her son and works during the day time. She is interested in someone helping with bathing, and light cleaning around the home. CSW informed she will have to research personal care services in the area. Patient to be followed by [facility] in the community.  Patient will need a RW and 3 IN 1 at discharge.   Expected Discharge Plan: Home/Self Care Barriers to Discharge: No Barriers Identified   Patient Goals and CMS Choice        Expected Discharge Plan and Services Expected Discharge Plan: Home/Self Care In-house Referral: Clinical Social Work Discharge Planning Services: CM Consult Post Acute Care Choice: [place] arrangements for the past 2 months: Single Family Home                 DME Arranged: 3-N-1, Walker rolling DME Agency: [employer] Date DME Agency Contacted: 10/01/19 Time DME Agency Contacted: [phone] Representative spoke with at DME Agency: [place] Arranged: PT, OT          Prior Living Arrangements/Services Living arrangements for the past 2 months: [place] Lives with:: Adult Children Patient language and need for interpreter reviewed:: No Do you feel safe going back to the place where you live?: Yes      Need for Family Participation in Patient Care: Yes (Comment) Care giver support system in place?: Yes (comment) Current home services: DME Criminal Activity/Legal Involvement Pertinent to Current Situation/Hospitalization: No - Comment as  needed  Activities of Daily Living Home Assistive Devices/Equipment: None ADL Screening (condition at time of admission) Patient's cognitive ability adequate to safely complete daily activities?: Yes Is the patient deaf or have difficulty hearing?: No Does the patient have difficulty seeing, even when wearing glasses/contacts?: No Does the patient have difficulty concentrating, remembering, or making decisions?: No Patient able to express need for assistance with ADLs?: Yes Does the patient have difficulty dressing or bathing?: Yes Independently performs ADLs?: Yes (appropriate for developmental age) Does the patient have difficulty walking or climbing stairs?: Yes Weakness of Legs: Both Weakness of Arms/Hands: Both  Permission Sought/Granted Permission sought to share information with : Family Supports Permission granted to share information with : Yes, Verbal Permission Granted              Emotional Assessment Appearance:: Appears stated age Attitude/Demeanor/Rapport: Engaged Affect (typically observed): Pleasant, Accepting Orientation: : Oriented to Self, Oriented to Place, Oriented to  Time, Oriented to Situation Alcohol / Substance Use: Not Applicable Psych Involvement: No (comment)  Admission diagnosis:  Dehydration [E86.0] Hypernatremia [E87.0] Leukocytosis [D72.829] AKI (acute kidney injury) ([place]) [N17.9] Intractable nausea and vomiting [R11.2] Non-intractable vomiting with nausea, unspecified vomiting type [R11.2] Patient Active Problem List   Diagnosis Date Noted  . Intractable nausea and vomiting 09/21/2019  . Generalized weakness 09/21/2019  . Acute on chronic kidney failure ([place]) 09/21/2019  . Leukocytosis 09/21/2019  . Hypernatremia 09/21/2019  . Hyperchloremia 09/21/2019  . Thrombocytopenia ([place]) 09/21/2019  . Port-A-Cath  in place 09/08/2019  . Family history of breast cancer   . Family history of throat cancer   . Family history of cervical cancer    . Malignant neoplasm of overlapping sites of left breast in female, estrogen receptor negative ([place]) 08/01/2019  . Family history of colon cancer mom 11/05/2018  . Long term (current) use of anticoagulants 09/17/2017  . Encounter for therapeutic drug monitoring 11/21/2013  . Asthma with bronchitis 09/01/2013  . Chest pain 01/01/2013  . Aortic insufficiency 10/25/2011  . Chronic anticoagulation 09/29/2011  . Nocturnal dyspnea 09/29/2011  . DVT (deep venous thrombosis) ([place]) 11/17/2010  . VITAMIN D DEFICIENCY 08/06/2009  . DVT 01/26/2009  . KNEE PAIN, RIGHT 06/19/2008  . EDEMA 06/19/2008  . ANKLE PAIN, LEFT 02/13/2008  . HYPERKALEMIA 08/20/2007  . HYPOKALEMIA 08/20/2007  . HLD (hyperlipidemia) 07/18/2007  . Cardiomyopathy, hypertensive ([place]) 07/18/2007  . Essential hypertension 06/26/2007  . Hypertensive heart disease with heart failure ([place]) 06/26/2007  . Congestive heart failure ([place]) 06/26/2007  . Allergic rhinitis, cause unspecified 06/26/2007  . ASTHMA 06/26/2007  . SYMPTOM, SYNCOPE AND COLLAPSE 06/26/2007  . HEADACHE 06/26/2007  . HEART MURMUR, HX OF 06/26/2007  . DVT, HX OF 06/26/2007   PCP:  Burnis Medin, MD Pharmacy:   [id number] [place] [address]Phone: [phone]: [phone] [place] [facility]Phone: [phone]: [phone]    Social Determinants of Health ([place] Interventions    Readmission Risk Interventions No flowsheet data found.

## 2019-10-01 NOTE — Progress Notes (Addendum)
Occupational Therapy Treatment Patient Details Name: [name] Foster MRN: [id number] DOB: June 14, 1951 Today's Date: 10/01/2019    History of present illness Pt is a 68 year old female with "PMH significant for but not limited too allergic rhinitis and asthma, history of CHF and coronary artery disease, hypertension, hyperlipidemia, myopathy, history of DVT on Coumadin, history of left-sided breast cancer currently on chemotherapy seeing Dr. Nicholas Lose, as well as other comorbidities who presents with a chief complaint of nausea vomiting as well as generalized weakness and fatigue.  Of note patient recently had chemotherapy on 09/08/2019 and had Neulasta on 09/09/2008."   OT comments  Pt fatiques quickly.  Had ambulated to bathroom for toileting and completed adl from toilet. She is good About pacing herself and taking rest breaks  Follow Up Recommendations  No OT follow up;Supervision - Intermittent    Equipment Recommendations  Tub/shower bench(if pt wants, out of pocket expense) pt has a 3:1 which she can place over toilet   Recommendations for Other Services      Precautions / Restrictions Precautions Precautions: Fall Restrictions Weight Bearing Restrictions: No       Mobility Bed Mobility           Sit to supine: Supervision      Transfers   Equipment used: Rolling walker (2 wheeled)   Sit to Stand: Min assist         General transfer comment: from comfort height commode with grab bar    Balance                                           ADL either performed or assessed with clinical judgement   ADL       Grooming: Set up;Sitting   Upper Body Bathing: Set up;Sitting   Lower Body Bathing: Moderate assistance(due to fatique)   Upper Body Dressing : Minimal assistance(lines)       Toilet Transfer: Minimal assistance(to get up from comfort height commode)             General ADL Comments: pt is good about pacing herself. She  fatiques quickly. She had walked to bathroom with NT prior to OTs arrival     Vision       Perception     Praxis      Cognition Arousal/Alertness: Awake/alert Behavior During Therapy: [facility] for tasks assessed/performed Overall Cognitive Status: Within Functional Limits for tasks assessed                                          Exercises     Shoulder Instructions       General Comments      Pertinent Vitals/ Pain       Pain Assessment: No/denies pain  Home Living                                          Prior Functioning/Environment              Frequency  Min 2X/week        Progress Toward Goals  OT Goals(current goals can now be found in the care plan section)  Progress towards OT goals:  Progressing toward goals     Plan      Co-evaluation                 AM-PAC OT "6 Clicks" Daily Activity     Outcome Measure   Help from another person eating meals?: None Help from another person taking care of personal grooming?: A Little Help from another person toileting, which includes using toliet, bedpan, or urinal?: A Little Help from another person bathing (including washing, rinsing, drying)?: A Lot Help from another person to put on and taking off regular upper body clothing?: A Little Help from another person to put on and taking off regular lower body clothing?: A Lot 6 Click Score: 17    End of Session    OT Visit Diagnosis: Unsteadiness on feet (R26.81);Muscle weakness (generalized) (M62.81)   Activity Tolerance Patient limited by fatigue   Patient Left in bed;with call bell/phone within reach;with bed alarm set   Nurse Communication          Time: 7782-4235 OT Time Calculation (min): 29 min  Charges: OT General Charges $OT Visit: 1 Visit OT Treatments $Self Care/Home Management : 23-37 mins  [place], OTR/L Acute Rehabilitation Services 10/01/2019   [place] 10/01/2019,  12:13 PM

## 2019-10-02 ENCOUNTER — Inpatient Hospital Stay: Payer: Medicare Other

## 2019-10-02 DIAGNOSIS — D72829 Elevated white blood cell count, unspecified: Secondary | ICD-10-CM

## 2019-10-02 LAB — BASIC METABOLIC PANEL
Anion gap: 11 (ref 5–15)
BUN: 23 mg/dL (ref 8–23)
CO2: 23 mmol/L (ref 22–32)
Calcium: 7.9 mg/dL — ABNORMAL LOW (ref 8.9–10.3)
Chloride: 102 mmol/L (ref 98–111)
Creatinine, Ser: 2.3 mg/dL — ABNORMAL HIGH (ref 0.44–1.00)
GFR calc Af Amer: 25 mL/min — ABNORMAL LOW (ref >60)
GFR calc non Af Amer: 21 mL/min — ABNORMAL LOW (ref >60)
Glucose, Bld: 129 mg/dL — ABNORMAL HIGH (ref 70–99)
Potassium: 3.5 mmol/L (ref 3.5–5.1)
Sodium: 136 mmol/L (ref 135–145)

## 2019-10-02 LAB — CBC
HCT: 25.8 % — ABNORMAL LOW (ref 36.0–46.0)
Hemoglobin: 8.2 g/dL — ABNORMAL LOW (ref 12.0–15.0)
MCH: 29.4 pg (ref 26.0–34.0)
MCHC: 31.8 g/dL (ref 30.0–36.0)
MCV: 92.5 fL (ref 80.0–100.0)
Platelets: 156 10*3/uL (ref 150–400)
RBC: 2.79 MIL/uL — ABNORMAL LOW (ref 3.87–5.11)
RDW: 16.8 % — ABNORMAL HIGH (ref 11.5–15.5)
WBC: 5.9 10*3/uL (ref 4.0–10.5)
nRBC: 0 % (ref 0.0–0.2)

## 2019-10-02 LAB — PROTIME-INR
INR: 2 — ABNORMAL HIGH (ref 0.8–1.2)
Prothrombin Time: 22.9 seconds — ABNORMAL HIGH (ref 11.4–15.2)

## 2019-10-02 LAB — GLUCOSE, CAPILLARY: Glucose-Capillary: 100 mg/dL — ABNORMAL HIGH (ref 70–99)

## 2019-10-02 MED ORDER — WARFARIN SODIUM 5 MG PO TABS
5.0000 mg | ORAL_TABLET | Freq: Once | ORAL | Status: AC
  Administered 2019-10-02: 5 mg via ORAL
  Filled 2019-10-02: qty 1

## 2019-10-02 NOTE — Progress Notes (Signed)
PROGRESS NOTE  [name] Foster MGQ:[id number] DOB: Oct 13, 1950 DOA: 09/21/2019 PCP: Burnis Medin, MD   LOS: 11 days   Brief Narrative / Interim history: 68 year old female with history of HTN, CAD, asthma, DVT on Coumadin, breast cancer currently on chemotherapy who came to the hospital on 12/20 due to nausea, vomiting, generalized weakness that have been progressive since her last chemotherapy.  She was found to be dehydrated and hypernatremic and was admitted to the hospital.  CT scan of the abdomen and pelvis did not show any intra-abdominal pathology.  She was placed on scheduled IV antiemetics with improvement in her symptoms.  She was also placed on IV fluids with improvement in her sodium, IV fluids have been discontinued 12/29 once sodium was in the 130 range however progressed to become hyponatremic to 127 on 12/30.  Slight fluid overload, will monitor with Lasix  Subjective / 24h Interval events: -Feels better today, did not have any vomiting last night or today so far, still with mild nausea but improving, doesn't feel well enough for home today  Assessment & Plan:  Intractable nausea/vomiting, poor oral intake -Last chemotherapy 09/08/2019, started having nausea and vomiting 4 days later.  Imaging on admission without any acute intra-abdominal pathology to explain her symptoms -Symptoms somewhat difficult to manage but were eventually controlled with scheduled intravenous Zofran.   -On 12/29 for intravenous Zofran has been changed to oral remaining scheduled -Slow overall improvement -Discharge planning  Hyponatremia, acute kidney injury on chronic kidney disease stage III -Likely due to poor p.o. intake, dehydration, sodium 156 on admission and now normalized -Baseline creatinine 1.4-1.8 in 2020 -Patient complained of slight swelling in her legs on 12/29 after fluids were discontinued at that time, was given IV Lasix few days ago -Appears close to euvolemic now, hyponatremia  has resolved, continue p.o. Lasix  Generalized weakness -PT recommending home health PT  Leukocytosis -Probably due to Neulasta, gradually normalizing  Thrombocytopenia -Secondary to chemotherapy, improving  Possible renal cell carcinoma -On CT scan on admission, will be followed as an outpatient  Breast cancer -Follows with oncology as an outpatient -Recent chemo as noted above  HTN/CAD/chronic diastolic CHF -Continue Norvasc, statin, metoprolol.  Back on Lasix  History of DVT -Platelet counts recovered, resumed Coumadin per pharmacy  History of asthma/bronchitis -No wheezing, stable  DVT prophylaxis: Started Coumadin Code Status: Full code Family Communication: Discussed with patient Disposition Plan: Home tomorrow If stable   Scheduled Meds: . amLODipine  10 mg Oral Daily  . atorvastatin  20 mg Oral q1800  . Chlorhexidine Gluconate Cloth  6 each Topical Daily  . cholecalciferol  2,000 Units Oral Daily  . dexamethasone  8 mg Intravenous Q24H  . feeding supplement (ENSURE ENLIVE)  237 mL Oral BID BM  . fluticasone  2 spray Each Nare Daily  . furosemide  40 mg Oral Daily  . hydrALAZINE  50 mg Oral TID  . loratadine  10 mg Oral Daily  . magic mouthwash  5 mL Oral TID  . metoprolol tartrate  50 mg Oral BID  . OLANZapine  5 mg Oral QHS  . ondansetron  8 mg Oral Q8H  . pantoprazole (PROTONIX) IV  40 mg Intravenous Q12H  . sodium chloride flush  10-40 mL Intracatheter Q12H  . warfarin  5 mg Oral ONCE-1800  . Warfarin - Pharmacist Dosing Inpatient   Does not apply q1800   Continuous Infusions:  PRN Meds:.acetaminophen **OR** acetaminophen, albuterol, bisacodyl, LORazepam, ondansetron, polyethylene glycol, polyvinyl alcohol, prochlorperazine, prochlorperazine,  promethazine, sodium chloride, sodium chloride flush   Consultants:  Oncology  Procedures:  None   Microbiology  None   Antimicrobials: None     Objective: Vitals:   10/01/19 1400 10/01/19 2004  10/02/19 0157 10/02/19 0511  BP: 136/66 119/73  116/69  Pulse: (!) 55 65  60  Resp: 18 18  17   Temp: 97.8 F (36.6 C) 98 F (36.7 C)  97.8 F (36.6 C)  TempSrc: Oral Oral  Oral  SpO2: 98% 96%  97%  Weight:   90.2 kg   Height:        Intake/Output Summary (Last 24 hours) at 10/02/2019 1345 Last data filed at 10/01/2019 1800 Gross per 24 hour  Intake 240 ml  Output --  Net 240 ml   Filed Weights   09/30/19 0500 09/30/19 0931 10/02/19 0157  Weight: 90.1 kg 89.9 kg 90.2 kg    Examination:  Gen: Awake, Alert, Oriented X 3, no distress HEENT: PERRLA, Neck supple, no JVD Lungs: Few basilar rails CVS: RRR,No Gallops,Rubs or new Murmurs Abd: soft, Non tender, non distended, BS present Extremities: No edema Skin: no new rashes Psychiatric: Normal judgment and insight. Alert and oriented x 3. Normal mood.    Data Reviewed: I have independently reviewed following labs and imaging studies   CBC: Recent Labs  Lab 09/27/19 0451 09/29/19 0259 10/01/19 0318 10/02/19 0435  WBC 12.0* 7.8 6.4 5.9  NEUTROABS 10.7* 6.5  --   --   HGB 8.8* 8.0* 8.2* 8.2*  HCT 28.4* 25.0* 25.7* 25.8*  MCV 92.8 90.6 92.4 92.5  PLT 71* 100* 134* 888   Basic Metabolic Panel: Recent Labs  Lab 09/27/19 0451 09/29/19 0259 10/01/19 0318 10/02/19 0435  NA 137 133* 127* 136  K 3.9 3.9 3.8 3.5  CL 108 105 100 102  CO2 21* 20* 21* 23  GLUCOSE 167* 190* 155* 129*  BUN 20 18 20 23   CREATININE 2.18* 2.22* 2.18* 2.30*  CALCIUM 8.1* 7.8* 7.6* 7.9*  MG 1.8  --   --   --   PHOS 3.5  --   --   --    Liver Function Tests: Recent Labs  Lab 09/29/19 0259 10/01/19 0318  AST 19 14*  ALT 28 25  ALKPHOS 77 64  BILITOT 0.6 0.6  PROT 4.9* 4.9*  ALBUMIN 2.7* 2.7*   Coagulation Profile: Recent Labs  Lab 09/28/19 0435 09/29/19 0259 09/30/19 0439 10/01/19 0318 10/02/19 0435  INR 2.2* 1.7* 1.7* 1.8* 2.0*   HbA1C: No results for input(s): HGBA1C in the last 72 hours. CBG: Recent Labs  Lab  09/28/19 0759 09/29/19 0747 09/30/19 0807 10/01/19 0750 10/02/19 0733  GLUCAP 130* 129* 104* 100* 100*    No results found for this or any previous visit (from the past 240 hour(s)).   Radiology Studies: No results found.   Domenic Polite, MD [facility]

## 2019-10-02 NOTE — Progress Notes (Signed)
[place] for warfarin Indication: VTE treatment  Allergies  Allergen Reactions  . Penicillins Rash    Did it involve swelling of the face/tongue/throat, SOB, or low BP? Unknown Did it involve sudden or severe rash/hives, skin peeling, or any reaction on the inside of your mouth or nose? Yes Did you need to seek medical attention at a hospital or doctor's office? Yes When did it last happen?in her 68s If all above answers are "NO", may proceed with cephalosporin use.   . Tetanus Toxoid Rash    Caused cellulitis   . Aspirin Rash   Patient Measurements: Height: 5\' 5"  (165.1 cm)(patient stated she was around 5'5" ) Weight: 198 lb 13.7 oz (90.2 kg) IBW/kg (Calculated) : 57 Heparin Dosing Weight:   Vital Signs: Temp: 97.8 F (36.6 C) (12/31 0511) Temp Source: Oral (12/31 0511) BP: 116/69 (12/31 0511) Pulse Rate: 60 (12/31 0511)  Labs: Recent Labs    09/30/19 0439 10/01/19 0318 10/02/19 0435  HGB  --  8.2* 8.2*  HCT  --  25.7* 25.8*  PLT  --  134* 156  LABPROT 19.6* 20.8* 22.9*  INR 1.7* 1.8* 2.0*  CREATININE  --  2.18* 2.30*   Estimated Creatinine Clearance: 26.3 mL/min (A) (by C-G formula based on SCr of 2.3 mg/dL (H)).  Medical History: Past Medical History:  Diagnosis Date  . Allergic rhinitis   . Aortic valve disorders   . Arthritis   . Asthma   . CHF (congestive heart failure) ([place])   . Coronary artery disease   . Family history of adverse reaction to anesthesia    difficulty waking mother up after surgery  . Family history of breast cancer   . Family history of cervical cancer   . Family history of colon cancer   . Family history of throat cancer   . Heart murmur   . Hyperlipidemia   . Hypertension   . Long term (current) use of anticoagulants   . Other primary cardiomyopathies   . Personal history of colonic polyps   . Personal history of venous thrombosis and embolism     Medications:  Scheduled:  .  amLODipine  10 mg Oral Daily  . atorvastatin  20 mg Oral q1800  . Chlorhexidine Gluconate Cloth  6 each Topical Daily  . cholecalciferol  2,000 Units Oral Daily  . dexamethasone  8 mg Intravenous Q24H  . feeding supplement (ENSURE ENLIVE)  237 mL Oral BID BM  . fluticasone  2 spray Each Nare Daily  . furosemide  40 mg Oral Daily  . hydrALAZINE  50 mg Oral TID  . loratadine  10 mg Oral Daily  . magic mouthwash  5 mL Oral TID  . metoprolol tartrate  50 mg Oral BID  . OLANZapine  5 mg Oral QHS  . ondansetron  8 mg Oral Q8H  . pantoprazole (PROTONIX) IV  40 mg Intravenous Q12H  . sodium chloride flush  10-40 mL Intracatheter Q12H  . Warfarin - Pharmacist Dosing Inpatient   Does not apply q1800   Assessment: 75 yoF on chronic warfarin for hx of VTE.  Last dose > 7 days prior to admit per med rec.  INR 1.1 on admit. Home dose 5mg  daily exc 2.5mg  qWed. Coumadin has been held due to low plts. hx DVT on warfarin PTA--INR 1.1 on admit.  LD > 7 days prior to admit. Restarted Coumadin on 12/28. INR 1.8. Plts up to 134  10/02/2019 INR  2.0, therapeutic H/h low but stable Eating ~ 25% of meals  Goal of Therapy:  INR 2-3    Plan:  Give Coumadin 5mg  PO x 1 Monitor daily INR, CBC, s/s of bleed  Dolly Rias RPh 10/02/2019, 7:41 AM

## 2019-10-02 NOTE — Progress Notes (Signed)
Physical Therapy Treatment Patient Details Name: [name] Foster MRN: [id number] DOB: 02/16/1951 Today's Date: 10/02/2019    History of Present Illness Pt is a 68 year old female with "PMH significant for but not limited too allergic rhinitis and asthma, history of CHF and coronary artery disease, hypertension, hyperlipidemia, myopathy, history of DVT on Coumadin, history of left-sided breast cancer currently on chemotherapy seeing Dr. Nicholas Lose, as well as other comorbidities who presents with a chief complaint of nausea vomiting as well as generalized weakness and fatigue.  Of note patient recently had chemotherapy on 09/08/2019 and had Neulasta on 09/09/2008."    PT Comments    Pt reports feeling a little better however still a little nauseated with mobility.   Follow Up Recommendations  Home health PT     Equipment Recommendations  Rolling walker with 5" wheels    Recommendations for Other Services       Precautions / Restrictions Precautions Precautions: Fall Precaution Comments: nauseous with movement    Mobility  Bed Mobility Overal bed mobility: Needs Assistance Bed Mobility: Supine to Sit;Sit to Supine     Supine to sit: Supervision;HOB elevated Sit to supine: Supervision;HOB elevated   General bed mobility comments: extra time  Transfers Overall transfer level: Needs assistance Equipment used: Rolling walker (2 wheeled) Transfers: Sit to/from Stand Sit to Stand: Min guard         General transfer comment: Close guard for safety. Increased time.  Ambulation/Gait Ambulation/Gait assistance: Min guard Gait Distance (Feet): 80 Feet Assistive device: Rolling walker (2 wheeled) Gait Pattern/deviations: Step-through pattern;Decreased stride length Gait velocity: decreased   General Gait Details: Close guard for safety. Slow gait speed. Pt reported some nausea with ambulation and denies dizziness; pt with 2-3 standing rest breaks, closing eyes and observed  small truncal sway however pt again denies dizziness   Stairs             Wheelchair Mobility    Modified Rankin (Stroke Patients Only)       Balance                                            Cognition Arousal/Alertness: Awake/alert Behavior During Therapy: WFL for tasks assessed/performed Overall Cognitive Status: Within Functional Limits for tasks assessed                                        Exercises      General Comments        Pertinent Vitals/Pain Pain Assessment: No/denies pain    Home Living                      Prior Function            PT Goals (current goals can now be found in the care plan section) Progress towards PT goals: Progressing toward goals    Frequency    Min 3X/week      PT Plan Current plan remains appropriate    Co-evaluation              AM-PAC PT "6 Clicks" Mobility   Outcome Measure  Help needed turning from your back to your side while in a flat bed without using bedrails?: A Little Help needed moving from lying on  your back to sitting on the side of a flat bed without using bedrails?: A Little Help needed moving to and from a bed to a chair (including a wheelchair)?: A Little Help needed standing up from a chair using your arms (e.g., wheelchair or bedside chair)?: A Little Help needed to walk in hospital room?: A Little Help needed climbing 3-5 steps with a railing? : A Little 6 Click Score: 18    End of Session   Activity Tolerance: Patient tolerated treatment well Patient left: in bed;with call bell/phone within reach   PT Visit Diagnosis: Difficulty in walking, not elsewhere classified (R26.2);Muscle weakness (generalized) (M62.81)     Time: 8937-3428 PT Time Calculation (min) (ACUTE ONLY): 12 min  Charges:  $Gait Training: 8-22 mins                    Arlyce Dice, DPT Acute Rehabilitation Services Office: [phone]   Agatha Duplechain,KATHrine  E 10/02/2019, 4:09 PM

## 2019-10-03 DIAGNOSIS — R1110 Vomiting, unspecified: Secondary | ICD-10-CM

## 2019-10-03 DIAGNOSIS — E860 Dehydration: Secondary | ICD-10-CM

## 2019-10-03 LAB — PROTIME-INR
INR: 2 — ABNORMAL HIGH (ref 0.8–1.2)
Prothrombin Time: 22.6 seconds — ABNORMAL HIGH (ref 11.4–15.2)

## 2019-10-03 LAB — CBC
HCT: 26.1 % — ABNORMAL LOW (ref 36.0–46.0)
Hemoglobin: 8.3 g/dL — ABNORMAL LOW (ref 12.0–15.0)
MCH: 29.3 pg (ref 26.0–34.0)
MCHC: 31.8 g/dL (ref 30.0–36.0)
MCV: 92.2 fL (ref 80.0–100.0)
Platelets: 179 10*3/uL (ref 150–400)
RBC: 2.83 MIL/uL — ABNORMAL LOW (ref 3.87–5.11)
RDW: 17.1 % — ABNORMAL HIGH (ref 11.5–15.5)
WBC: 6.7 10*3/uL (ref 4.0–10.5)
nRBC: 0 % (ref 0.0–0.2)

## 2019-10-03 LAB — GLUCOSE, CAPILLARY: Glucose-Capillary: 176 mg/dL — ABNORMAL HIGH (ref 70–99)

## 2019-10-03 MED ORDER — PANTOPRAZOLE SODIUM 40 MG PO TBEC
40.0000 mg | DELAYED_RELEASE_TABLET | Freq: Two times a day (BID) | ORAL | Status: DC
  Administered 2019-10-03: 40 mg via ORAL
  Filled 2019-10-03: qty 1

## 2019-10-03 MED ORDER — WARFARIN SODIUM 5 MG PO TABS: 5.0000 mg | ORAL_TABLET | Freq: Once | ORAL | Status: DC

## 2019-10-03 MED ORDER — HEPARIN SOD (PORK) LOCK FLUSH 100 UNIT/ML IV SOLN
500.0000 [IU] | INTRAVENOUS | Status: AC | PRN
  Administered 2019-10-03: 500 [IU]
  Filled 2019-10-03: qty 5

## 2019-10-03 MED ORDER — HYDRALAZINE HCL 50 MG PO TABS: 25.0000 mg | ORAL_TABLET | Freq: Three times a day (TID) | ORAL | 0 refills | Status: DC

## 2019-10-03 NOTE — Discharge Summary (Signed)
Physician Discharge Summary  [name] Foster ZOX:[id number] DOB: 1951/04/13 DOA: 09/21/2019  PCP: Burnis Medin, MD  Admit date: 09/21/2019 Discharge date: 10/03/2019  Time spent: 35 minutes  Recommendations for Outpatient Follow-up:  1. PCP in 1 week   Discharge Diagnoses:  Left breast malignant neoplasm on neoadjuvant chemotherapy Intractable nausea and vomiting   HLD (hyperlipidemia)   Essential hypertension   Hypertensive heart disease with heart failure (HCC)   Congestive heart failure (HCC)   DVT, HX OF   Aortic insufficiency   Asthma with bronchitis   Long term (current) use of anticoagulants   Malignant neoplasm of overlapping sites of left breast in female, estrogen receptor negative ([place])   Port-A-Cath in place   Intractable nausea and vomiting   Generalized weakness   AKI (acute kidney injury) ([place])   Leukocytosis   Hypernatremia   Hyperchloremia   Thrombocytopenia (HCC)   Dehydration   Non-intractable vomiting   Discharge Condition: stable  Diet recommendation: Heart healthy diet  Filed Weights   09/30/19 0931 10/02/19 0157 10/03/19 0549  Weight: 89.9 kg 90.2 kg 86.9 kg    History of present illness:  69 year old female with history of HTN, CAD, asthma, DVT on Coumadin, breast cancer currently on chemotherapy who came to the hospital on 12/20 due to nausea, vomiting, generalized weakness that have been progressive since her last chemotherapy.  She was found to be dehydrated and hypernatremic and was admitted to the hospital.    Hospital Course:   Intractable nausea/vomiting, poor oral intake -Secondary to effects of recent chemotherapy , last chemotherapy 09/08/2019, started having nausea and vomiting 4 days later.  Imaging on admission without any acute intra-abdominal pathology to explain her symptoms -Symptoms somewhat difficult to manage but were eventually controlled with scheduled intravenous Zofran.   -Slow clinical improvement with supportive  care, -Discharged home on as needed Zofran, in addition to Compazine -Follow-up with oncology Dr. Lindi Adie  Hyponatremia, acute kidney injury on chronic kidney disease stage III -Likely due to poor p.o. intake, dehydration, sodium 156 on admission and now normalized -Baseline creatinine 1.4-1.8 in 2020 -After initial hydration did she did transiently develop fluid overload, required dose of IV Lasix few days ago, now stable on her home regimen of oral Lasix daily  Generalized weakness -PT recommending home health PT  Leukocytosis -Probably due to Neulasta, gradually normalizing  Thrombocytopenia -Secondary to chemotherapy, improving  Possible renal cell carcinoma -On CT scan on admission, will be followed as an outpatient  Breast cancer -Follows with oncology as an outpatient -Recent chemo as noted above  HTN/CAD/chronic diastolic CHF -Continue Norvasc, statin, metoprolol.  Back on Lasix  History of DVT -Platelet counts recovered, resumed Coumadin per pharmacy  History of asthma/bronchitis -No wheezing, stable  Discharge Exam: Vitals:   10/03/19 0552 10/03/19 1408  BP: 114/66 121/81  Pulse: (!) 55 69  Resp: 18 20  Temp: 98 F (36.7 C) 97.9 F (36.6 C)  SpO2: 99% 99%    General: AAOx3 Cardiovascular:S1S2/RRR Respiratory: CTAB  Discharge Instructions   Discharge Instructions    Diet - low sodium heart healthy   Complete by: As directed    Increase activity slowly   Complete by: As directed      Allergies as of 10/03/2019      Reactions   Penicillins Rash   Did it involve swelling of the face/tongue/throat, SOB, or low BP? Unknown Did it involve sudden or severe rash/hives, skin peeling, or any reaction on the inside of your mouth or  nose? Yes Did you need to seek medical attention at a hospital or doctor's office? Yes When did it last happen?in her 22s If all above answers are "NO", may proceed with cephalosporin use.   Tetanus Toxoid Rash    Caused cellulitis    Aspirin Rash      Medication List    STOP taking these medications   oxyCODONE 5 MG immediate release tablet Commonly known as: Oxy IR/ROXICODONE   ramipril 10 MG capsule Commonly known as: ALTACE     TAKE these medications   Albuterol Sulfate 108 (90 Base) MCG/ACT Aepb Commonly known as: ProAir RespiClick Inhale 2 puffs into the lungs every 6 (six) hours as needed. What changed: reasons to take this   amLODipine 10 MG tablet Commonly known as: NORVASC TAKE 1 TABLET BY MOUTH  DAILY   atorvastatin 20 MG tablet Commonly known as: LIPITOR TAKE 1 TABLET BY MOUTH  DAILY What changed: when to take this   Clear Eyes for Dry Eyes 1-0.25 % Soln Generic drug: Carboxymethylcellul-Glycerin Place 1 drop into both eyes 2 (two) times daily as needed (dry eyes).   dexamethasone 4 MG tablet Commonly known as: DECADRON Take 1 tablet (4 mg total) by mouth 2 (two) times daily. Take 1 tablet day before chemo and 1 tablet day after chemo with food   fexofenadine 180 MG tablet Commonly known as: ALLEGRA Take 180 mg by mouth daily as needed for allergies.   fluticasone 50 MCG/ACT nasal spray Commonly known as: FLONASE USE 2 SPRAYS IN EACH NOSTRIL DAILY. PT NEEDS TO SCHEDULE A FOLLOW UP APPT BEFORE NEXT REFILL. What changed: See the new instructions.   furosemide 40 MG tablet Commonly known as: LASIX TAKE 1 TABLET BY MOUTH  DAILY   hydrALAZINE 50 MG tablet Commonly known as: APRESOLINE Take 0.5 tablets (25 mg total) by mouth 3 (three) times daily. What changed: how much to take   lidocaine-prilocaine cream Commonly known as: EMLA Apply to affected area once   LORazepam 0.5 MG tablet Commonly known as: Ativan Take 1 tablet (0.5 mg total) by mouth at bedtime as needed for sleep.   magic mouthwash w/lidocaine Soln Take 5 mLs by mouth 3 (three) times daily as needed for mouth pain.   metoprolol tartrate 50 MG tablet Commonly known as: LOPRESSOR TAKE 1  TABLET BY MOUTH  TWICE DAILY   ondansetron 8 MG tablet Commonly known as: Zofran Take 1 tablet (8 mg total) by mouth 2 (two) times daily as needed (Nausea or vomiting). Begin 4 days after chemotherapy.   potassium chloride SA 20 MEQ tablet Commonly known as: KLOR-CON TAKE 1 TABLET BY MOUTH  DAILY   prochlorperazine 10 MG tablet Commonly known as: COMPAZINE Take 1 tablet (10 mg total) by mouth every 6 (six) hours as needed (Nausea or vomiting).   sodium chloride 0.65 % Soln nasal spray Commonly known as: OCEAN Place 1 spray into both nostrils as needed for congestion.   Vitamin D 50 MCG (2000 UT) tablet Take 2,000 Units by mouth daily.   warfarin 5 MG tablet Commonly known as: COUMADIN Take as directed. If you are unsure how to take this medication, talk to your nurse or doctor. Original instructions: Take 1 tablet daily or as directed by anticoagulation clinic. What changed:   how much to take  how to take this  when to take this  additional instructions            Durable Medical Equipment  (From admission, onward)  Start     Ordered   10/02/19 1238  For home use only DME Walker rolling  Once    Question:  Patient needs a walker to treat with the following condition  Answer:  Unsteady gait   10/02/19 1238         Allergies  Allergen Reactions  . Penicillins Rash    Did it involve swelling of the face/tongue/throat, SOB, or low BP? Unknown Did it involve sudden or severe rash/hives, skin peeling, or any reaction on the inside of your mouth or nose? Yes Did you need to seek medical attention at a hospital or doctor's office? Yes When did it last happen?in her 40s If all above answers are "NO", may proceed with cephalosporin use.   . Tetanus Toxoid Rash    Caused cellulitis   . Aspirin Rash   Follow-up Information    Nicholas Lose, MD. Schedule an appointment as soon as possible for a visit in 1 week(s).   Specialty: Hematology and  Oncology Contact information: [place] [id number] [phone]        Burnis Medin, MD. Schedule an appointment as soon as possible for a visit in 1 week(s).   Specialties: Internal Medicine, Pediatrics Contact information: [place] [address] [phone]            The results of significant diagnostics from this hospitalization (including imaging, microbiology, ancillary and laboratory) are listed below for reference.    Significant Diagnostic Studies: CT ABDOMEN PELVIS WO CONTRAST  Result Date: 09/21/2019 CLINICAL DATA:  Weakness and nausea and vomiting for 1 week. Undergoing chemotherapy for breast carcinoma. EXAM: CT ABDOMEN AND PELVIS WITHOUT CONTRAST TECHNIQUE: Multidetector CT imaging of the abdomen and pelvis was performed following the standard protocol without IV contrast. COMPARISON:  08/12/2019 FINDINGS: Lower chest: No acute findings. Hepatobiliary: No mass visualized on this unenhanced exam. Focal fatty infiltration again noted adjacent to the falciform ligament. Gallbladder is unremarkable. No evidence of biliary ductal dilatation. Pancreas: No mass or inflammatory process visualized on this unenhanced exam. Spleen:  Within normal limits in size. Adrenals/Urinary tract: No evidence of urolithiasis or hydronephrosis. A tiny subcapsular lesion is again seen in the posterior upper pole the left kidney which demonstrate contrast enhancement on prior exam, suspicious for small renal cell carcinoma. Unremarkable unopacified urinary bladder. Stomach/Bowel: No evidence of obstruction, inflammatory process, or abnormal fluid collections. Normal appendix visualized. Vascular/Lymphatic: No pathologically enlarged lymph nodes identified. No evidence of abdominal aortic aneurysm. Aortic atherosclerosis incidentally noted. Reproductive: Prior hysterectomy noted. Adnexal regions are unremarkable in appearance. Other:  None.  Musculoskeletal: No suspicious bone lesions identified. Stable mild compression fracture deformity of the L2 vertebral body. IMPRESSION: No acute findings. Tiny subcapsular lesion in posterior upper pole of left kidney which showed contrast enhancement on prior exam, suspicious for renal cell carcinoma. Electronically Signed   By: Marlaine Hind M.D.   On: 09/21/2019 16:48   DG CHEST PORT 1 VIEW  Result Date: 09/21/2019 CLINICAL DATA:  Generalized weakness EXAM: PORTABLE CHEST 1 VIEW COMPARISON:  August 18, 2019 FINDINGS: The heart size and mediastinal contours are within normal limits. A left-sided MediPort catheter seen with the tip at the superior SVC. Aortic knob calcifications. Both lungs are clear. The visualized skeletal structures are unremarkable. IMPRESSION: No active disease. Electronically Signed   By: Prudencio Pair M.D.   On: 09/21/2019 16:10    Microbiology: No results found for this or any previous visit (from the  past 240 hour(s)).   Labs: Basic Metabolic Panel: Recent Labs  Lab 09/27/19 0451 09/29/19 0259 10/01/19 0318 10/02/19 0435  NA 137 133* 127* 136  K 3.9 3.9 3.8 3.5  CL 108 105 100 102  CO2 21* 20* 21* 23  GLUCOSE 167* 190* 155* 129*  BUN 20 18 20 23   CREATININE 2.18* 2.22* 2.18* 2.30*  CALCIUM 8.1* 7.8* 7.6* 7.9*  MG 1.8  --   --   --   PHOS 3.5  --   --   --    Liver Function Tests: Recent Labs  Lab 09/29/19 0259 10/01/19 0318  AST 19 14*  ALT 28 25  ALKPHOS 77 64  BILITOT 0.6 0.6  PROT 4.9* 4.9*  ALBUMIN 2.7* 2.7*   No results for input(s): LIPASE, AMYLASE in the last 168 hours. No results for input(s): AMMONIA in the last 168 hours. CBC: Recent Labs  Lab 09/27/19 0451 09/29/19 0259 10/01/19 0318 10/02/19 0435 10/03/19 0407  WBC 12.0* 7.8 6.4 5.9 6.7  NEUTROABS 10.7* 6.5  --   --   --   HGB 8.8* 8.0* 8.2* 8.2* 8.3*  HCT 28.4* 25.0* 25.7* 25.8* 26.1*  MCV 92.8 90.6 92.4 92.5 92.2  PLT 71* 100* 134* 156 179   Cardiac Enzymes: No  results for input(s): CKTOTAL, CKMB, CKMBINDEX, TROPONINI in the last 168 hours. BNP: BNP (last 3 results) No results for input(s): BNP in the last 8760 hours.  ProBNP (last 3 results) No results for input(s): PROBNP in the last 8760 hours.  CBG: Recent Labs  Lab 09/29/19 0747 09/30/19 0807 10/01/19 0750 10/02/19 0733 10/03/19 0744  GLUCAP 129* 104* 100* 100* 176*       Signed:  Domenic Polite MD.  [facility] 10/03/2019, 2:38 PM

## 2019-10-03 NOTE — Progress Notes (Signed)
Occupational Therapy Treatment Patient Details Name: [name] Foster MRN: [id number] DOB: Feb 06, 1951 Today's Date: 10/03/2019    History of present illness Pt is a 69 year old female with "PMH significant for but not limited too allergic rhinitis and asthma, history of CHF and coronary artery disease, hypertension, hyperlipidemia, myopathy, history of DVT on Coumadin, history of left-sided breast cancer currently on chemotherapy seeing Dr. Nicholas Lose, as well as other comorbidities who presents with a chief complaint of nausea vomiting as well as generalized weakness and fatigue.  Of note patient recently had chemotherapy on 09/08/2019 and had Neulasta on 09/09/2008."   OT comments  Pt with improved independence in sponge bathing and dressing, but continues to need frequent rest breaks. Assisted for soaking feet, applying lotion to feet and legs, pt donned clean socks. Reinforced energy conservation strategies once pt returns home. Pt to d/c home today with her son's assist.  Follow Up Recommendations  No OT follow up;Supervision - Intermittent    Equipment Recommendations  Tub/shower bench(will get on her own)    Recommendations for Other Services      Precautions / Restrictions Precautions Precautions: Fall       Mobility Bed Mobility Overal bed mobility: Modified Independent             General bed mobility comments: HOB up  Transfers Overall transfer level: Needs assistance Equipment used: Rolling walker (2 wheeled) Transfers: Sit to/from Stand Sit to Stand: Min guard         General transfer comment: for safety, increased time    Balance Overall balance assessment: Needs assistance   Sitting balance-Leahy Scale: Normal       Standing balance-Leahy Scale: Fair Standing balance comment: can release walker in static standing to manage pants                           ADL either performed or assessed with clinical judgement   ADL Overall ADL's :  Needs assistance/impaired             Lower Body Bathing: Moderate assistance       Lower Body Dressing: Set up;Sitting/lateral leans   Toilet Transfer: Min guard;Ambulation;RW;BSC           Functional mobility during ADLs: Min guard;Rolling walker General ADL Comments: pt taking multiple rest breaks while seated at sink completing sponge bathing and dressing, fatigued and needing help for washing feet, lotion on feet and lower legs, donned fresh socks     Vision       Perception     Praxis      Cognition Arousal/Alertness: Awake/alert Behavior During Therapy: WFL for tasks assessed/performed Overall Cognitive Status: Within Functional Limits for tasks assessed                                          Exercises     Shoulder Instructions       General Comments      Pertinent Vitals/ Pain       Pain Assessment: No/denies pain  Home Living                                          Prior Functioning/Environment  Frequency  Min 2X/week        Progress Toward Goals  OT Goals(current goals can now be found in the care plan section)  Progress towards OT goals: Progressing toward goals  Acute Rehab OT Goals Patient Stated Goal: to feel better  OT Goal Formulation: With patient Time For Goal Achievement: 10/07/19 Potential to Achieve Goals: Good  Plan Discharge plan remains appropriate    Co-evaluation                 AM-PAC OT "6 Clicks" Daily Activity     Outcome Measure   Help from another person eating meals?: None Help from another person taking care of personal grooming?: A Little Help from another person toileting, which includes using toliet, bedpan, or urinal?: A Little Help from another person bathing (including washing, rinsing, drying)?: A Lot Help from another person to put on and taking off regular upper body clothing?: None Help from another person to put on and taking off  regular lower body clothing?: A Little 6 Click Score: 19    End of Session    OT Visit Diagnosis: Unsteadiness on feet (R26.81);Muscle weakness (generalized) (M62.81)   Activity Tolerance Patient tolerated treatment well   Patient Left in bed;with call bell/phone within reach   Nurse Communication          Time: 1440-1459 OT Time Calculation (min): 19 min  Charges: OT General Charges $OT Visit: 1 Visit OT Treatments $Self Care/Home Management : 8-22 mins  Nestor Lewandowsky, OTR/L Acute Rehabilitation Services Pager: [phone] Office: [phone]   Malka So 10/03/2019, 2:59 PM

## 2019-10-03 NOTE — Progress Notes (Signed)
[facility] for warfarin Indication: VTE treatment  Allergies  Allergen Reactions  . Penicillins Rash    Did it involve swelling of the face/tongue/throat, SOB, or low BP? Unknown Did it involve sudden or severe rash/hives, skin peeling, or any reaction on the inside of your mouth or nose? Yes Did you need to seek medical attention at a hospital or doctor's office? Yes When did it last happen?in her 8s If all above answers are "NO", may proceed with cephalosporin use.   . Tetanus Toxoid Rash    Caused cellulitis   . Aspirin Rash   Patient Measurements: Height: 5\' 5"  (165.1 cm)(patient stated she was around 5'5" ) Weight: 191 lb 9.3 oz (86.9 kg) IBW/kg (Calculated) : 57 Heparin Dosing Weight:   Vital Signs: Temp: 98 F (36.7 C) (01/01 0552) Temp Source: Oral (01/01 0552) BP: 114/66 (01/01 0552) Pulse Rate: 55 (01/01 0552)  Labs: Recent Labs    10/01/19 0318 10/02/19 0435 10/03/19 0407  HGB 8.2* 8.2* 8.3*  HCT 25.7* 25.8* 26.1*  PLT 134* 156 179  LABPROT 20.8* 22.9* 22.6*  INR 1.8* 2.0* 2.0*  CREATININE 2.18* 2.30*  --    Estimated Creatinine Clearance: 25.9 mL/min (A) (by C-G formula based on SCr of 2.3 mg/dL (H)).  Medical History: Past Medical History:  Diagnosis Date  . Allergic rhinitis   . Aortic valve disorders   . Arthritis   . Asthma   . CHF (congestive heart failure) ([place])   . Coronary artery disease   . Family history of adverse reaction to anesthesia    difficulty waking mother up after surgery  . Family history of breast cancer   . Family history of cervical cancer   . Family history of colon cancer   . Family history of throat cancer   . Heart murmur   . Hyperlipidemia   . Hypertension   . Long term (current) use of anticoagulants   . Other primary cardiomyopathies   . Personal history of colonic polyps   . Personal history of venous thrombosis and embolism     Medications:  Scheduled:  .  amLODipine  10 mg Oral Daily  . atorvastatin  20 mg Oral q1800  . Chlorhexidine Gluconate Cloth  6 each Topical Daily  . cholecalciferol  2,000 Units Oral Daily  . dexamethasone  8 mg Intravenous Q24H  . feeding supplement (ENSURE ENLIVE)  237 mL Oral BID BM  . fluticasone  2 spray Each Nare Daily  . furosemide  40 mg Oral Daily  . hydrALAZINE  50 mg Oral TID  . loratadine  10 mg Oral Daily  . magic mouthwash  5 mL Oral TID  . metoprolol tartrate  50 mg Oral BID  . OLANZapine  5 mg Oral QHS  . ondansetron  8 mg Oral Q8H  . pantoprazole (PROTONIX) IV  40 mg Intravenous Q12H  . sodium chloride flush  10-40 mL Intracatheter Q12H  . Warfarin - Pharmacist Dosing Inpatient   Does not apply q1800   Assessment: 39 yoF on chronic warfarin for hx of VTE.  Last dose > 7 days prior to admit per med rec.  INR 1.1 on admit. Home dose 5mg  daily exc 2.5mg  qWed. Coumadin has been held due to low plts. hx DVT on warfarin PTA--INR 1.1 on admit.  LD > 7 days prior to admit. Restarted Coumadin on 12/28. INR 1.8. Plts up to 134  10/03/2019 INR 2.0, therapeutic H/h low but stable,  plts WNL improving Eating ~ 25-50% of meals  Goal of Therapy:  INR 2-3    Plan:  Repeat Coumadin 5mg  PO x 1 at 1800 Monitor daily INR, CBC, s/s of bleed   Adrian Saran, PharmD, BCPS 10/03/2019 9:57 AM

## 2019-10-07 ENCOUNTER — Encounter: Payer: Self-pay | Admitting: *Deleted

## 2019-10-07 NOTE — Consult Note (Signed)
   [facility] Inpatient Consult   10/07/2019  [name] Foster [birth year] [id number]    Follow-Up Note:   Patient was discharged home on 10/03/19 (declined home health). Called patient to follow-up referral received from Inpatient transition of care CM regarding needs post discharge related to cancer dx on chemo.      Spoke to patient over the phone and HIPAA verified.  [facility] [employer] officer, community explained as well as personal care services and possibility for [employer] application. Patient reports that "things are going pretty well and is getting stronger everyday from the time they spoke with me regarding personal care needs", I was too weak then").   She denies any needs or barriers with medications (self-managed); pharmacy (uses [facility] & [facility]); transportation (provided by son and sister) at this point and "not needing any help for now."  Made aware of transportation benefits from [employer] to her primary provider's appointments. Patient is aware to follow-up with primary care provider: Dr Shanon Ace with [place] [facility] post hospital discharge and to contact provider shouldany health needs orconcernsarise.  Patient denies any needs forTHN([facility]) services for now, however, she provided her physical address and would like [facility] information mailed to her for anyfuture needs.   Plan: Will notify [facility] CM assistant to mail [facility] brochure/ information to address provided: [address] [place], [place] [address].     For questions and additional information, please call:  Jerrik Housholder A. Roshaun Pound, BSN, RN-BC [facility] Liaison Cell: [phone]

## 2019-10-21 ENCOUNTER — Inpatient Hospital Stay: Payer: Medicare Other

## 2019-10-21 ENCOUNTER — Telehealth: Payer: Self-pay

## 2019-10-21 ENCOUNTER — Telehealth: Payer: Self-pay | Admitting: *Deleted

## 2019-10-21 ENCOUNTER — Inpatient Hospital Stay: Payer: Medicare Other | Admitting: Hematology and Oncology

## 2019-10-21 ENCOUNTER — Inpatient Hospital Stay: Payer: Medicare Other | Attending: Hematology and Oncology

## 2019-10-21 DIAGNOSIS — C50812 Malignant neoplasm of overlapping sites of left female breast: Secondary | ICD-10-CM | POA: Insufficient documentation

## 2019-10-21 DIAGNOSIS — Z5112 Encounter for antineoplastic immunotherapy: Secondary | ICD-10-CM | POA: Insufficient documentation

## 2019-10-21 DIAGNOSIS — Z5111 Encounter for antineoplastic chemotherapy: Secondary | ICD-10-CM | POA: Insufficient documentation

## 2019-10-21 DIAGNOSIS — Z86718 Personal history of other venous thrombosis and embolism: Secondary | ICD-10-CM | POA: Insufficient documentation

## 2019-10-21 DIAGNOSIS — Z7901 Long term (current) use of anticoagulants: Secondary | ICD-10-CM | POA: Insufficient documentation

## 2019-10-21 NOTE — Telephone Encounter (Signed)
Called Mrs. Haskins and left a voicemail to call back and reschedule her appts.  I no showed her appointments from today and I cancelled her 1/21 injection.  That will need to be rescheduled when her infusion is rescheduled.  Gardiner Rhyme

## 2019-10-21 NOTE — Telephone Encounter (Signed)
Left vm for pt regarding missed appts. Contact information provided for return call.

## 2019-10-21 NOTE — Assessment & Plan Note (Deleted)
08/01/2019:Patient palpated left breast lump and skin thickening. Mammogram showed a 3.6cm mass at 3:30 position, a 0.9cm mass at 3:00 position, a 0.7cm mass at 2:00 position, a 0.5cm mass at 2:00 position, a 0.3cm mass at 1:00 position, and a 0.4cm mass at 1:00 position, with 4 abnormal left axillary lymph nodes. Biopsy showed IDC, grade 3, HER-2 + (3+), ER/PR -, Ki67 70%, in the breast and lymph nodes.  Treatment plan 1. Neoadjuvant chemotherapy with TCH Perjeta 6 cycles followed by HerceptinPerjetamaintenance versus Kadcyla maintenance (depending on response to chemo)for 1 year 2. Followed bymastectomy withaxillary lymph node dissection 3. Followed by adjuvant radiation therapy  CT CAP 08/12/2019: 3.9 cm left breast mass with left axillary lymph nodes. 10 mm lesion upper pole of left kidney suspicious for small renal cell cancer. 3 mm left lower lobe nodule, 4 mm subcapsular lesion anterior hepatic dome too small Bone scan 08/15/2019: Benign Breast MRI 08/12/2019: Locally advanced left breast cancer 4 cm, multiple irregular enhancing masses, diffuse skin thickening and enhancement and multiple pathogenic level 1 axillary lymph nodes at least 5-6 -------------------------------------------------------------------------------------------------------------------------------------------------- Current treatment: Cycle 3 TCH Perjeta Chemo toxicities: 1.  Hospitalization after second cycle of chemo 09/21/2019: Intractable nausea and vomiting 2.  Diarrhea and dehydration 3.  Loss of taste and appetite 4.  Profound fatigue  We discussed the pros and cons of continuing with current treatment.  Given the extent of axillary lymph nodes, we decided to proceed with treatment with dose reduction.  We will also discontinue carboplatin.  Renal mass: Abdominal MRI has been ordered but has not been done we will follow up on that. History of DVT: Currently on Coumadin

## 2019-10-23 ENCOUNTER — Telehealth: Payer: Self-pay | Admitting: *Deleted

## 2019-10-23 ENCOUNTER — Ambulatory Visit: Payer: Medicare Other

## 2019-10-23 NOTE — Telephone Encounter (Signed)
Left vm and sent verified email to pt regarding missed appt and need to r/s new appt with Dr. Lindi Adie and infusion.  Scheduling msg sent and request pt to return call.

## 2019-10-24 ENCOUNTER — Telehealth: Payer: Self-pay | Admitting: *Deleted

## 2019-10-24 NOTE — Telephone Encounter (Signed)
Pt returned call requesting appt with Dr. Lindi Foster. Informed pt that her schedule is being worked on and they we are looking to have her come in on 1/29. Informed she will receive a call on Monday with an appt. Received verbal understanding. Denies further questions or needs.

## 2019-10-27 ENCOUNTER — Telehealth: Payer: Self-pay | Admitting: Hematology and Oncology

## 2019-10-27 ENCOUNTER — Encounter: Payer: Self-pay | Admitting: *Deleted

## 2019-10-27 NOTE — Telephone Encounter (Signed)
Left a message regarding 1/29

## 2019-10-30 NOTE — Progress Notes (Signed)
HPI: FU nonischemic cardiomyopathy andaortic insufficiency. Previous catheterization in 2004 showed no obstructive coronary disease. CT of her chest in Nov 2008 showed no thoracic aneurysm. Previous Holter monitor secondary to "dizzy spells" showed PACs and PVCs. Nuclear study April 2014 showed an ejection fraction of 62% and normal perfusion. Patient underwent transesophageal echocardiogram in May of 2014. Her ejection fraction was 50-55%. There was moderate aortic insufficiency and mild mitral regurgitation. There was a linear density associated with the atrial septum of uncertain etiology. Patient has had occasional noncompliance in the past with medications.  If blood pressure not controlled LV function noted to be reduced with worsening aortic insufficiency. Most recent echocardiogram November 2020 showed normal LV function, grade 2 diastolic dysfunction, mild left ventricular enlargement, moderate mitral regurgitation, mild tricuspid regurgitation, moderate aortic insufficiency.  Since I last saw her,she has been diagnosed with breast cancer and is undergoing chemotherapy.  She has had some fatigue related to this.  She denies dyspnea, chest pain, palpitations, syncope or pedal edema.  Her ACE inhibitor was discontinued previously because of dehydration/renal insufficiency.  Current Outpatient Medications  Medication Sig Dispense Refill  . Albuterol Sulfate (PROAIR RESPICLICK) 762 (90 Base) MCG/ACT AEPB Inhale 2 puffs into the lungs every 6 (six) hours as needed. (Patient taking differently: Inhale 2 puffs into the lungs every 6 (six) hours as needed (SOB / wheezing). ) 2 each 1  . amLODipine (NORVASC) 10 MG tablet TAKE 1 TABLET BY MOUTH  DAILY (Patient taking differently: Take 10 mg by mouth daily. ) 90 tablet 3  . atorvastatin (LIPITOR) 20 MG tablet TAKE 1 TABLET BY MOUTH  DAILY (Patient taking differently: Take 20 mg by mouth daily at 6 PM. ) 90 tablet 1  . Carboxymethylcellul-Glycerin  (CLEAR EYES FOR DRY EYES) 1-0.25 % SOLN Place 1 drop into both eyes 2 (two) times daily as needed (dry eyes).    . Cholecalciferol (VITAMIN D) 50 MCG (2000 UT) tablet Take 2,000 Units by mouth daily.    Marland Kitchen dexamethasone (DECADRON) 4 MG tablet Take 1 tablet (4 mg total) by mouth 2 (two) times daily. Take 1 tablet day before chemo and 1 tablet day after chemo with food 12 tablet 0  . fexofenadine (ALLEGRA) 180 MG tablet Take 180 mg by mouth daily as needed for allergies.     . fluticasone (FLONASE) 50 MCG/ACT nasal spray USE 2 SPRAYS IN EACH NOSTRIL DAILY. PT NEEDS TO SCHEDULE A FOLLOW UP APPT BEFORE NEXT REFILL. (Patient taking differently: Place 2 sprays into both nostrils daily. ) 16 g 0  . furosemide (LASIX) 40 MG tablet TAKE 1 TABLET BY MOUTH  DAILY (Patient taking differently: Take 40 mg by mouth daily. ) 90 tablet 1  . hydrALAZINE (APRESOLINE) 50 MG tablet Take 0.5 tablets (25 mg total) by mouth 3 (three) times daily. 1 tablet 0  . lidocaine-prilocaine (EMLA) cream Apply to affected area once 30 g 3  . LORazepam (ATIVAN) 0.5 MG tablet Take 1 tablet (0.5 mg total) by mouth at bedtime as needed for sleep. 30 tablet 0  . metoprolol tartrate (LOPRESSOR) 50 MG tablet TAKE 1 TABLET BY MOUTH  TWICE DAILY (Patient taking differently: Take 50 mg by mouth 2 (two) times daily. ) 180 tablet 1  . ondansetron (ZOFRAN) 8 MG tablet Take 1 tablet (8 mg total) by mouth 2 (two) times daily as needed (Nausea or vomiting). Begin 4 days after chemotherapy. 30 tablet 1  . potassium chloride SA (KLOR-CON) 20 MEQ tablet  TAKE 1 TABLET BY MOUTH  DAILY (Patient taking differently: Take 20 mEq by mouth daily. ) 90 tablet 3  . prochlorperazine (COMPAZINE) 10 MG tablet Take 1 tablet (10 mg total) by mouth every 6 (six) hours as needed (Nausea or vomiting). 30 tablet 1  . sodium chloride (OCEAN) 0.65 % SOLN nasal spray Place 1 spray into both nostrils as needed for congestion.    Marland Kitchen warfarin (COUMADIN) 5 MG tablet Take 1 tablet  daily or as directed by anticoagulation clinic. (Patient taking differently: Take 2.5-5 mg by mouth See admin instructions. Take 1 tablet daily or as directed by anticoagulation clinic; 5 mg all days except taking 2.5 mg on Wed) 100 tablet 1   No current facility-administered medications for this visit.     Past Medical History:  Diagnosis Date  . Allergic rhinitis   . Aortic valve disorders   . Arthritis   . Asthma   . CHF (congestive heart failure) ([place])   . Coronary artery disease   . Family history of adverse reaction to anesthesia    difficulty waking mother up after surgery  . Family history of breast cancer   . Family history of cervical cancer   . Family history of colon cancer   . Family history of throat cancer   . Heart murmur   . Hyperlipidemia   . Hypertension   . Long term (current) use of anticoagulants   . Other primary cardiomyopathies   . Personal history of colonic polyps   . Personal history of venous thrombosis and embolism     Past Surgical History:  Procedure Laterality Date  . ABDOMINAL HYSTERECTOMY    . CARDIAC CATHETERIZATION    . COLON SURGERY     colonoscopy  . DILATION AND CURETTAGE OF UTERUS     several  . PORTACATH PLACEMENT Left 08/18/2019   Procedure: INSERTION PORT-A-CATH;  Surgeon: Stark Klein, MD;  Location: [place];  Service: General;  Laterality: Left;  . Rt knee arthoscopic    . TEE WITHOUT CARDIOVERSION N/A 02/21/2013   Procedure: TRANSESOPHAGEAL ECHOCARDIOGRAM (TEE);  Surgeon: Lelon Perla, MD;  Location: [facility] ENDOSCOPY;  Service: Cardiovascular;  Laterality: N/A;    Social History   Socioeconomic History  . Marital status: Legally Separated    Spouse name: Not on file  . Number of children: Not on file  . Years of education: Not on file  . Highest education level: Not on file  Occupational History  . Not on file  Tobacco Use  . Smoking status: Former Smoker    Packs/day: 0.25    Quit date: 07/02/2019    Years since  quitting: 0.3  . Smokeless tobacco: Never Used  Substance and Sexual Activity  . Alcohol use: Yes    Comment: rare occasion  . Drug use: No  . Sexual activity: Not on file  Other Topics Concern  . 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    Divorced   Regular exercise- no   5 hours sleep    Lives with a son age 25    No pets         Social Determinants of Health   Financial Resource Strain:   . Difficulty of Paying Living Expenses: Not on file  Food Insecurity:   . Worried About Charity fundraiser in the Last Year: Not on file  . Ran Out of Food in the Last Year: Not on  file  Transportation Needs:   . Film/video editor (Medical): Not on file  . Lack of Transportation (Non-Medical): Not on file  Physical Activity:   . Days of Exercise per Week: Not on file  . Minutes of Exercise per Session: Not on file  Stress:   . Feeling of Stress : Not on file  Social Connections:   . Frequency of Communication with Friends and Family: Not on file  . Frequency of Social Gatherings with Friends and Family: Not on file  . Attends Religious Services: Not on file  . Active Member of Clubs or Organizations: Not on file  . Attends Archivist Meetings: Not on file  . Marital Status: Not on file  Intimate Partner Violence:   . Fear of Current or Ex-Partner: Not on file  . Emotionally Abused: Not on file  . Physically Abused: Not on file  . Sexually Abused: Not on file    Family History  Problem Relation Age of Onset  . Alcohol abuse Other   . Depression Other   . Hyperlipidemia Other   . Hypertension Other   . Kidney disease Other   . Colon cancer Mother        dx late 88s  . Cervical cancer Maternal Grandmother   . Stroke Paternal Grandmother   . Breast cancer Maternal Aunt        dx 83s  . Breast cancer Cousin        dx 52s    ROS: no fevers or chills, productive cough, hemoptysis, dysphasia, odynophagia, melena,  hematochezia, dysuria, hematuria, rash, seizure activity, orthopnea, PND, pedal edema, claudication. Remaining systems are negative.  Physical Exam: Well-developed well-nourished in no acute distress.  Skin is warm and dry.  HEENT is normal.  Neck is supple.  Chest is clear to auscultation with normal expansion.  Cardiovascular exam is regular rate and rhythm.  Abdominal exam nontender or distended. No masses palpated. Extremities show no edema. neuro grossly intact   A/P  1 aortic insufficiency-patient with moderate aortic insufficiency and moderate mitral regurgitation on most recent echocardiogram.  LV function appears to be preserved.  We will plan to repeat study November 2020.  Follow for recurrent symptoms.  2 nonischemic cardiomyopathy-LV function improved on most recent echocardiogram.  This typically has worsened in the past when she has been noncompliant with medications and her blood pressure has been elevated.  We will continue with beta-blocker and hydralazine.  ACE inhibitor previously discontinued because of dehydration/renal insufficiency.  We will resume in the future if blood pressure and renal function allow following her chemotherapy for her breast cancer.  3 hypertension-blood pressure controlled.  Continue present medications.  4 hyperlipidemia-continue statin.  5 prior DVT-managed by primary care.  Kirk Ruths, MD

## 2019-10-30 NOTE — Progress Notes (Signed)
Patient Care Team: Panosh, Standley Brooking, MD as PCP - General Stanford Breed, Denice Bors, MD (Cardiology) Susa Day, MD (Orthopedic Surgery) Mauro Kaufmann, RN as Oncology Nurse Navigator Rockwell Germany, RN as Oncology Nurse Navigator  DIAGNOSIS:    ICD-10-CM   1. Malignant neoplasm of overlapping sites of left breast in female, estrogen receptor negative ([facility])  C50.812 DISCONTINUED: DOCEtaxel (TAXOTERE) 110 mg in sodium chloride 0.9 % 250 mL chemo infusion   Z17.1     SUMMARY OF ONCOLOGIC HISTORY: Oncology History  Malignant neoplasm of overlapping sites of left breast in female, estrogen receptor negative ([facility])  08/01/2019 Initial Diagnosis   Patient palpated left breast lump and skin thickening. Mammogram showed a 3.6cm mass at 3:30 position, a 0.9cm mass at 3:00 position, a 0.7cm mass at 2:00 position, a 0.5cm mass at 2:00 position, a 0.3cm mass at 1:00 position, and a 0.4cm mass at 1:00 position, with 4 abnormal left axillary lymph nodes. Biopsy showed IDC, grade 3, HER-2 + (3+), ER/PR -, Ki67 70%, in the breast and lymph nodes.    08/06/2019 Cancer Staging   Staging form: Breast, AJCC 8th Edition - Clinical: Stage IIIB (cT4, cN1, cM0, G3, ER-, PR-, HER2+) - Signed by Nicholas Lose, MD on 08/06/2019   08/19/2019 -  Chemotherapy   The patient had palonosetron (ALOXI) injection 0.25 mg, 0.25 mg, Intravenous,  Once, 3 of 6 cycles Administration: 0.25 mg (08/19/2019), 0.25 mg (09/08/2019) pegfilgrastim-cbqv (UDENYCA) injection 6 mg, 6 mg, Subcutaneous, Once, 3 of 6 cycles Administration: 6 mg (08/21/2019), 6 mg (09/10/2019) CARBOplatin (PARAPLATIN) 480 mg in sodium chloride 0.9 % 250 mL chemo infusion, 480 mg (110.9 % of original dose 429.6 mg), Intravenous,  Once, 2 of 2 cycles Dose modification:   (original dose 429.6 mg, Cycle 1) Administration: 480 mg (08/19/2019), 340 mg (09/08/2019) DOCEtaxel (TAXOTERE) 160 mg in sodium chloride 0.9 % 250 mL chemo infusion, 75 mg/m2 = 160 mg,  Intravenous,  Once, 3 of 6 cycles Dose modification: 60 mg/m2 (original dose 75 mg/m2, Cycle 2, Reason: Dose not tolerated), 50 mg/m2 (original dose 75 mg/m2, Cycle 5, Reason: Dose not tolerated) Administration: 160 mg (08/19/2019), 130 mg (09/08/2019) pertuzumab (PERJETA) 420 mg in sodium chloride 0.9 % 250 mL chemo infusion, 420 mg (100 % of original dose 420 mg), Intravenous, Once, 2 of 2 cycles Dose modification: 420 mg (original dose 420 mg, Cycle 1, Reason: Provider Judgment) Administration: 420 mg (08/19/2019), 420 mg (09/08/2019) fosaprepitant (EMEND) 150 mg, dexamethasone (DECADRON) 12 mg in sodium chloride 0.9 % 145 mL IVPB, , Intravenous,  Once, 3 of 6 cycles Administration:  (08/19/2019),  (09/08/2019) trastuzumab-anns (KANJINTI) 750 mg in sodium chloride 0.9 % 250 mL chemo infusion, 756 mg (100 % of original dose 8 mg/kg), Intravenous,  Once, 3 of 6 cycles Dose modification: 8 mg/kg (original dose 8 mg/kg, Cycle 1, Reason: Other (see comments), Comment: change to approved brand), 6 mg/kg (original dose 6 mg/kg, Cycle 2, Reason: Other (see comments), Comment: brand change per insurance) Administration: 750 mg (08/19/2019), 567 mg (09/08/2019)  for chemotherapy treatment.    09/21/2019 - 10/03/2019 Hospital Admission   Intractable nausea and vomiting     CHIEF COMPLIANT: Cycle 3Taxotere and Herceptin (Perjeta and carboplatin discontinued)  INTERVAL HISTORY: [name] Foster is a 69 y.o. with above-mentioned history of left breast cancer.She is currently on neoadjuvant chemotherapywith TCH Perjeta. She was admitted from 09/21/19-10/03/19 for intractable nausea and vomiting. She presents to the clinic todayfor cycle 3.  Her nausea and vomiting  have resolved.  She feels tired but is recovering very well.  ALLERGIES:  is allergic to penicillins; tetanus toxoid; and aspirin.  MEDICATIONS:  Current Outpatient Medications  Medication Sig Dispense Refill  . Albuterol Sulfate (PROAIR  RESPICLICK) 374 (90 Base) MCG/ACT AEPB Inhale 2 puffs into the lungs every 6 (six) hours as needed. (Patient taking differently: Inhale 2 puffs into the lungs every 6 (six) hours as needed (SOB / wheezing). ) 2 each 1  . amLODipine (NORVASC) 10 MG tablet TAKE 1 TABLET BY MOUTH  DAILY (Patient taking differently: Take 10 mg by mouth daily. ) 90 tablet 3  . atorvastatin (LIPITOR) 20 MG tablet TAKE 1 TABLET BY MOUTH  DAILY (Patient taking differently: Take 20 mg by mouth daily at 6 PM. ) 90 tablet 1  . Carboxymethylcellul-Glycerin (CLEAR EYES FOR DRY EYES) 1-0.25 % SOLN Place 1 drop into both eyes 2 (two) times daily as needed (dry eyes).    . Cholecalciferol (VITAMIN D) 50 MCG (2000 UT) tablet Take 2,000 Units by mouth daily.    Marland Kitchen dexamethasone (DECADRON) 4 MG tablet Take 1 tablet (4 mg total) by mouth 2 (two) times daily. Take 1 tablet day before chemo and 1 tablet day after chemo with food 12 tablet 0  . fexofenadine (ALLEGRA) 180 MG tablet Take 180 mg by mouth daily as needed for allergies.     . fluticasone (FLONASE) 50 MCG/ACT nasal spray USE 2 SPRAYS IN EACH NOSTRIL DAILY. PT NEEDS TO SCHEDULE A FOLLOW UP APPT BEFORE NEXT REFILL. (Patient taking differently: Place 2 sprays into both nostrils daily. ) 16 g 0  . furosemide (LASIX) 40 MG tablet TAKE 1 TABLET BY MOUTH  DAILY (Patient taking differently: Take 40 mg by mouth daily. ) 90 tablet 1  . hydrALAZINE (APRESOLINE) 50 MG tablet Take 0.5 tablets (25 mg total) by mouth 3 (three) times daily. 1 tablet 0  . lidocaine-prilocaine (EMLA) cream Apply to affected area once 30 g 3  . LORazepam (ATIVAN) 0.5 MG tablet Take 1 tablet (0.5 mg total) by mouth at bedtime as needed for sleep. 30 tablet 0  . magic mouthwash w/lidocaine SOLN Take 5 mLs by mouth 3 (three) times daily as needed for mouth pain. 100 mL 1  . metoprolol tartrate (LOPRESSOR) 50 MG tablet TAKE 1 TABLET BY MOUTH  TWICE DAILY (Patient taking differently: Take 50 mg by mouth 2 (two) times  daily. ) 180 tablet 1  . ondansetron (ZOFRAN) 8 MG tablet Take 1 tablet (8 mg total) by mouth 2 (two) times daily as needed (Nausea or vomiting). Begin 4 days after chemotherapy. 30 tablet 1  . potassium chloride SA (KLOR-CON) 20 MEQ tablet TAKE 1 TABLET BY MOUTH  DAILY (Patient taking differently: Take 20 mEq by mouth daily. ) 90 tablet 3  . prochlorperazine (COMPAZINE) 10 MG tablet Take 1 tablet (10 mg total) by mouth every 6 (six) hours as needed (Nausea or vomiting). 30 tablet 1  . sodium chloride (OCEAN) 0.65 % SOLN nasal spray Place 1 spray into both nostrils as needed for congestion.    Marland Kitchen warfarin (COUMADIN) 5 MG tablet Take 1 tablet daily or as directed by anticoagulation clinic. (Patient taking differently: Take 2.5-5 mg by mouth See admin instructions. Take 1 tablet daily or as directed by anticoagulation clinic; 5 mg all days except taking 2.5 mg on Wed) 100 tablet 1   No current facility-administered medications for this visit.   Facility-Administered Medications Ordered in Other Visits  Medication Dose  Route Frequency Provider Last Rate Last Admin  . DOCEtaxel (TAXOTERE) 110 mg in sodium chloride 0.9 % 250 mL chemo infusion  50 mg/m2 (Treatment Plan Recorded) Intravenous Once Nicholas Lose, MD 261 mL/hr at 10/31/19 1126 110 mg at 10/31/19 1126  . heparin lock flush 100 unit/mL  500 Units Intracatheter Once PRN Nicholas Lose, MD      . sodium chloride flush (NS) 0.9 % injection 10 mL  10 mL Intracatheter PRN Nicholas Lose, MD        PHYSICAL EXAMINATION: ECOG PERFORMANCE STATUS: 1 - Symptomatic but completely ambulatory  Vitals:   10/31/19 0830  BP: 116/71  Pulse: 74  Resp: 18  Temp: 98.7 F (37.1 C)  SpO2: 100%   Filed Weights   10/31/19 0830  Weight: 193 lb 4.8 oz (87.7 kg)    LABORATORY DATA:  I have reviewed the data as listed CMP Latest Ref Rng & Units 10/31/2019 10/02/2019 10/01/2019  Glucose 70 - 99 mg/dL 112(H) 129(H) 155(H)  BUN 8 - 23 mg/dL '10 23 20    ' Creatinine 0.44 - 1.00 mg/dL 1.62(H) 2.30(H) 2.18(H)  Sodium 135 - 145 mmol/L 145 136 127(L)  Potassium 3.5 - 5.1 mmol/L 3.3(L) 3.5 3.8  Chloride 98 - 111 mmol/L 109 102 100  CO2 22 - 32 mmol/L 26 23 21(L)  Calcium 8.9 - 10.3 mg/dL 8.5(L) 7.9(L) 7.6(L)  Total Protein 6.5 - 8.1 g/dL 6.5 - 4.9(L)  Total Bilirubin 0.3 - 1.2 mg/dL 0.2(L) - 0.6  Alkaline Phos 38 - 126 U/L 128(H) - 64  AST 15 - 41 U/L 12(L) - 14(L)  ALT 0 - 44 U/L 7 - 25    Lab Results  Component Value Date   WBC 6.4 10/31/2019   HGB 9.1 (L) 10/31/2019   HCT 29.4 (L) 10/31/2019   MCV 95.8 10/31/2019   PLT 319 10/31/2019   NEUTROABS 4.5 10/31/2019    ASSESSMENT & PLAN:  Malignant neoplasm of overlapping sites of left breast in female, estrogen receptor negative ([place]) 08/01/2019:Patient palpated left breast lump and skin thickening. Mammogram showed a 3.6cm mass at 3:30 position, a 0.9cm mass at 3:00 position, a 0.7cm mass at 2:00 position, a 0.5cm mass at 2:00 position, a 0.3cm mass at 1:00 position, and a 0.4cm mass at 1:00 position, with 4 abnormal left axillary lymph nodes. Biopsy showed IDC, grade 3, HER-2 + (3+), ER/PR -, Ki67 70%, in the breast and lymph nodes.  Treatment plan 1. Neoadjuvant chemotherapy with TCH Perjeta 6 cycles followed by HerceptinPerjetamaintenance versus Kadcyla maintenance (depending on response to chemo)for 1 year 2. Followed bymastectomy withaxillary lymph node dissection 3. Followed by adjuvant radiation therapy  CT CAP 08/12/2019: 3.9 cm left breast mass with left axillary lymph nodes. 10 mm lesion upper pole of left kidney suspicious for small renal cell cancer. 3 mm left lower lobe nodule, 4 mm subcapsular lesion anterior hepatic dome too small Bone scan 08/15/2019: Benign Breast MRI 08/12/2019: Locally advanced left breast cancer 4 cm, multiple irregular enhancing masses, diffuse skin thickening and enhancement and multiple pathogenic level 1 axillary lymph nodes at least  5-6 -------------------------------------------------------------------------------------------------------------------------------------------------- Current treatment: Cycle 3 TCH Perjeta (we will discontinue Perjeta and carboplatin and reduce the dosage of Taxotere) Chemo toxicities: 1.  Hospitalization after second cycle of chemo 09/21/2019: Intractable nausea and vomiting: Carboplatin will be discontinued and Taxotere dose will be reduced 2.  Diarrhea and dehydration: We will stop Perjeta 3.  Loss of taste and appetite 4.  Profound fatigue  Chronic renal  failure: Markedly improved since hospitalization.  We discussed the pros and cons of continuing with current treatment.  Given the extent of axillary lymph nodes, we decided to proceed with treatment with dose reduction.  We will also discontinue carboplatin.  Renal mass: Abdominal MRI  History of DVT: Currently on Coumadin      No orders of the defined types were placed in this encounter.  The patient has a good understanding of the overall plan. she agrees with it. she will call with any problems that may develop before the next visit here.  Total time spent: 30 mins including face to face time and time spent for planning, charting and coordination of care  Nicholas Lose, MD 10/31/2019  I, Cloyde Reams Dorshimer, am acting as scribe for Dr. Nicholas Lose.  I have reviewed the above documentation for accuracy and completeness, and I agree with the above.

## 2019-10-31 ENCOUNTER — Inpatient Hospital Stay (HOSPITAL_BASED_OUTPATIENT_CLINIC_OR_DEPARTMENT_OTHER): Payer: Medicare Other | Admitting: Hematology and Oncology

## 2019-10-31 ENCOUNTER — Inpatient Hospital Stay: Payer: Medicare Other

## 2019-10-31 ENCOUNTER — Encounter: Payer: Self-pay | Admitting: *Deleted

## 2019-10-31 ENCOUNTER — Other Ambulatory Visit: Payer: Self-pay

## 2019-10-31 DIAGNOSIS — C50812 Malignant neoplasm of overlapping sites of left female breast: Secondary | ICD-10-CM

## 2019-10-31 DIAGNOSIS — Z95828 Presence of other vascular implants and grafts: Secondary | ICD-10-CM

## 2019-10-31 DIAGNOSIS — Z5111 Encounter for antineoplastic chemotherapy: Secondary | ICD-10-CM | POA: Diagnosis not present

## 2019-10-31 DIAGNOSIS — Z5112 Encounter for antineoplastic immunotherapy: Secondary | ICD-10-CM | POA: Diagnosis not present

## 2019-10-31 DIAGNOSIS — Z7901 Long term (current) use of anticoagulants: Secondary | ICD-10-CM | POA: Diagnosis not present

## 2019-10-31 DIAGNOSIS — Z171 Estrogen receptor negative status [ER-]: Secondary | ICD-10-CM

## 2019-10-31 DIAGNOSIS — Z86718 Personal history of other venous thrombosis and embolism: Secondary | ICD-10-CM | POA: Diagnosis not present

## 2019-10-31 LAB — CBC WITH DIFFERENTIAL (CANCER CENTER ONLY)
Abs Immature Granulocytes: 0.02 10*3/uL (ref 0.00–0.07)
Basophils Absolute: 0 10*3/uL (ref 0.0–0.1)
Basophils Relative: 0 %
Eosinophils Absolute: 0 10*3/uL (ref 0.0–0.5)
Eosinophils Relative: 0 %
HCT: 29.4 % — ABNORMAL LOW (ref 36.0–46.0)
Hemoglobin: 9.1 g/dL — ABNORMAL LOW (ref 12.0–15.0)
Immature Granulocytes: 0 %
Lymphocytes Relative: 24 %
Lymphs Abs: 1.5 10*3/uL (ref 0.7–4.0)
MCH: 29.6 pg (ref 26.0–34.0)
MCHC: 31 g/dL (ref 30.0–36.0)
MCV: 95.8 fL (ref 80.0–100.0)
Monocytes Absolute: 0.4 10*3/uL (ref 0.1–1.0)
Monocytes Relative: 6 %
Neutro Abs: 4.5 10*3/uL (ref 1.7–7.7)
Neutrophils Relative %: 70 %
Platelet Count: 319 10*3/uL (ref 150–400)
RBC: 3.07 MIL/uL — ABNORMAL LOW (ref 3.87–5.11)
RDW: 16.6 % — ABNORMAL HIGH (ref 11.5–15.5)
WBC Count: 6.4 10*3/uL (ref 4.0–10.5)
nRBC: 0 % (ref 0.0–0.2)

## 2019-10-31 LAB — PROTIME-INR
INR: 2 — ABNORMAL HIGH (ref 0.8–1.2)
Prothrombin Time: 22.6 seconds — ABNORMAL HIGH (ref 11.4–15.2)

## 2019-10-31 LAB — CMP (CANCER CENTER ONLY)
ALT: 7 U/L (ref 0–44)
AST: 12 U/L — ABNORMAL LOW (ref 15–41)
Albumin: 3.2 g/dL — ABNORMAL LOW (ref 3.5–5.0)
Alkaline Phosphatase: 128 U/L — ABNORMAL HIGH (ref 38–126)
Anion gap: 10 (ref 5–15)
BUN: 10 mg/dL (ref 8–23)
CO2: 26 mmol/L (ref 22–32)
Calcium: 8.5 mg/dL — ABNORMAL LOW (ref 8.9–10.3)
Chloride: 109 mmol/L (ref 98–111)
Creatinine: 1.62 mg/dL — ABNORMAL HIGH (ref 0.44–1.00)
GFR, Est AFR Am: 38 mL/min — ABNORMAL LOW (ref >60)
GFR, Estimated: 33 mL/min — ABNORMAL LOW (ref >60)
Glucose, Bld: 112 mg/dL — ABNORMAL HIGH (ref 70–99)
Potassium: 3.3 mmol/L — ABNORMAL LOW (ref 3.5–5.1)
Sodium: 145 mmol/L (ref 135–145)
Total Bilirubin: 0.2 mg/dL — ABNORMAL LOW (ref 0.3–1.2)
Total Protein: 6.5 g/dL (ref 6.5–8.1)

## 2019-10-31 MED ORDER — PALONOSETRON HCL INJECTION 0.25 MG/5ML
INTRAVENOUS | Status: AC
  Filled 2019-10-31: qty 5

## 2019-10-31 MED ORDER — SODIUM CHLORIDE 0.9 % IV SOLN
Freq: Once | INTRAVENOUS | Status: AC
  Filled 2019-10-31: qty 250

## 2019-10-31 MED ORDER — TRASTUZUMAB-ANNS CHEMO 150 MG IV SOLR
6.0000 mg/kg | Freq: Once | INTRAVENOUS | Status: AC
  Administered 2019-10-31: 567 mg via INTRAVENOUS
  Filled 2019-10-31: qty 27

## 2019-10-31 MED ORDER — PALONOSETRON HCL INJECTION 0.25 MG/5ML
0.2500 mg | Freq: Once | INTRAVENOUS | Status: AC
  Administered 2019-10-31: 0.25 mg via INTRAVENOUS

## 2019-10-31 MED ORDER — DIPHENHYDRAMINE HCL 25 MG PO CAPS
50.0000 mg | ORAL_CAPSULE | Freq: Once | ORAL | Status: AC
  Administered 2019-10-31: 50 mg via ORAL

## 2019-10-31 MED ORDER — DIPHENHYDRAMINE HCL 25 MG PO CAPS
ORAL_CAPSULE | ORAL | Status: AC
  Filled 2019-10-31: qty 2

## 2019-10-31 MED ORDER — SODIUM CHLORIDE 0.9 % IV SOLN
50.0000 mg/m2 | Freq: Once | INTRAVENOUS | Status: AC
  Administered 2019-10-31: 110 mg via INTRAVENOUS
  Filled 2019-10-31: qty 11

## 2019-10-31 MED ORDER — SODIUM CHLORIDE 0.9% FLUSH
10.0000 mL | INTRAVENOUS | Status: DC | PRN
  Administered 2019-10-31: 10 mL
  Filled 2019-10-31: qty 10

## 2019-10-31 MED ORDER — SODIUM CHLORIDE 0.9 % IV SOLN
Freq: Once | INTRAVENOUS | Status: AC
  Filled 2019-10-31: qty 5

## 2019-10-31 MED ORDER — ACETAMINOPHEN 325 MG PO TABS
ORAL_TABLET | ORAL | Status: AC
  Filled 2019-10-31: qty 2

## 2019-10-31 MED ORDER — HEPARIN SOD (PORK) LOCK FLUSH 100 UNIT/ML IV SOLN
500.0000 [IU] | Freq: Once | INTRAVENOUS | Status: AC | PRN
  Administered 2019-10-31: 500 [IU]
  Filled 2019-10-31: qty 5

## 2019-10-31 MED ORDER — ACETAMINOPHEN 325 MG PO TABS
650.0000 mg | ORAL_TABLET | Freq: Once | ORAL | Status: AC
  Administered 2019-10-31: 650 mg via ORAL

## 2019-10-31 NOTE — Assessment & Plan Note (Signed)
08/01/2019:Patient palpated left breast lump and skin thickening. Mammogram showed a 3.6cm mass at 3:30 position, a 0.9cm mass at 3:00 position, a 0.7cm mass at 2:00 position, a 0.5cm mass at 2:00 position, a 0.3cm mass at 1:00 position, and a 0.4cm mass at 1:00 position, with 4 abnormal left axillary lymph nodes. Biopsy showed IDC, grade 3, HER-2 + (3+), ER/PR -, Ki67 70%, in the breast and lymph nodes.  Treatment plan 1. Neoadjuvant chemotherapy with TCH Perjeta 6 cycles followed by HerceptinPerjetamaintenance versus Kadcyla maintenance (depending on response to chemo)for 1 year 2. Followed bymastectomy withaxillary lymph node dissection 3. Followed by adjuvant radiation therapy  CT CAP 08/12/2019: 3.9 cm left breast mass with left axillary lymph nodes. 10 mm lesion upper pole of left kidney suspicious for small renal cell cancer. 3 mm left lower lobe nodule, 4 mm subcapsular lesion anterior hepatic dome too small Bone scan 08/15/2019: Benign Breast MRI 08/12/2019: Locally advanced left breast cancer 4 cm, multiple irregular enhancing masses, diffuse skin thickening and enhancement and multiple pathogenic level 1 axillary lymph nodes at least 5-6 -------------------------------------------------------------------------------------------------------------------------------------------------- Current treatment: Cycle 3 TCH Perjeta (we will discontinue Perjeta and carboplatin and reduce the dosage of Taxotere) Chemo toxicities: 1.  Hospitalization after second cycle of chemo 09/21/2019: Intractable nausea and vomiting: Carboplatin will be discontinued and Taxotere dose will be reduced 2.  Diarrhea and dehydration: We will stop Perjeta 3.  Loss of taste and appetite 4.  Profound fatigue  We discussed the pros and cons of continuing with current treatment.  Given the extent of axillary lymph nodes, we decided to proceed with treatment with dose reduction.  We will also discontinue  carboplatin.  Renal mass: Abdominal MRI has been ordered but has not been done we will follow up on that. History of DVT: Currently on Coumadin

## 2019-10-31 NOTE — Patient Instructions (Signed)
[name] Foster Discharge Instructions for Patients Receiving Chemotherapy  Today you received the following chemotherapy agents Kanjinti and Taxotere.  To help prevent nausea and vomiting after your treatment, we encourage you to take your nausea medication as directed BUT NO ZOFRAN FOR 3 DAYS AFTER CHEMO.   If you develop nausea and vomiting that is not controlled by your nausea medication, call the clinic.   BELOW ARE SYMPTOMS THAT SHOULD BE REPORTED IMMEDIATELY:  *FEVER GREATER THAN 100.5 F  *CHILLS WITH OR WITHOUT FEVER  NAUSEA AND VOMITING THAT IS NOT CONTROLLED WITH YOUR NAUSEA MEDICATION  *UNUSUAL SHORTNESS OF BREATH  *UNUSUAL BRUISING OR BLEEDING  TENDERNESS IN MOUTH AND THROAT WITH OR WITHOUT PRESENCE OF ULCERS  *URINARY PROBLEMS  *BOWEL PROBLEMS  UNUSUAL RASH Items with * indicate a potential emergency and should be followed up as soon as possible.  Feel free to call the clinic you have any questions or concerns. The clinic phone number is [phone].  Please show the [place] at check-in to the Emergency Department and triage nurse.

## 2019-10-31 NOTE — Progress Notes (Signed)
Per MD okay to treat with crt 1.62

## 2019-11-03 ENCOUNTER — Telehealth: Payer: Self-pay | Admitting: Hematology and Oncology

## 2019-11-03 ENCOUNTER — Inpatient Hospital Stay: Payer: Medicare Other

## 2019-11-03 NOTE — Telephone Encounter (Signed)
I could not reach patient regarding schedule mailbox was full 

## 2019-11-04 ENCOUNTER — Encounter: Payer: Self-pay | Admitting: *Deleted

## 2019-11-04 ENCOUNTER — Telehealth: Payer: Self-pay | Admitting: *Deleted

## 2019-11-04 NOTE — Telephone Encounter (Signed)
Called pt to discuss scheduling of abd MRI. No answer and was unable to leave vm d/t mailbox being full. Will call again.

## 2019-11-05 ENCOUNTER — Encounter: Payer: Self-pay | Admitting: *Deleted

## 2019-11-05 ENCOUNTER — Other Ambulatory Visit: Payer: Self-pay

## 2019-11-05 ENCOUNTER — Ambulatory Visit (INDEPENDENT_AMBULATORY_CARE_PROVIDER_SITE_OTHER): Payer: Medicare Other | Admitting: Cardiology

## 2019-11-05 ENCOUNTER — Encounter: Payer: Self-pay | Admitting: Cardiology

## 2019-11-05 ENCOUNTER — Telehealth: Payer: Self-pay | Admitting: *Deleted

## 2019-11-05 VITALS — BP 104/72 | HR 73 | Ht 66.0 in | Wt 191.4 lb

## 2019-11-05 DIAGNOSIS — I428 Other cardiomyopathies: Secondary | ICD-10-CM | POA: Diagnosis not present

## 2019-11-05 DIAGNOSIS — I10 Essential (primary) hypertension: Secondary | ICD-10-CM | POA: Diagnosis not present

## 2019-11-05 DIAGNOSIS — I351 Nonrheumatic aortic (valve) insufficiency: Secondary | ICD-10-CM

## 2019-11-05 NOTE — Telephone Encounter (Signed)
Called pt to discuss scheduling abd MRI. No answer and unable to leave vm d/t mailbox full. Will try again.

## 2019-11-05 NOTE — Patient Instructions (Signed)
Medication Instructions:  NO CHANGE *If you need a refill on your cardiac medications before your next appointment, please call your pharmacy*  Lab Work: If you have labs (blood work) drawn today and your tests are completely normal, you will receive your results only by: . MyChart Message (if you have MyChart) OR . A paper copy in the mail If you have any lab test that is abnormal or we need to change your treatment, we will call you to review the results.  Follow-Up: At [facility], you and your health needs are our priority.  As part of our continuing mission to provide you with exceptional heart care, we have created designated Provider Care Teams.  These Care Teams include your primary Cardiologist (physician) and Advanced Practice Providers (APPs -  Physician Assistants and Nurse Practitioners) who all work together to provide you with the care you need, when you need it.  Your next appointment:   6 month(s)  The format for your next appointment:   Either In Person or Virtual  Provider:   You may see BRIAN CRENSHAW MD or one of the following Advanced Practice Providers on your designated Care Team:    Luke Kilroy, PA-C  Callie Goodrich, PA-C  Jesse Cleaver, FNP    

## 2019-11-06 NOTE — Progress Notes (Signed)
Patient Care Team: Panosh, Standley Brooking, MD as PCP - General Stanford Breed, Denice Bors, MD (Cardiology) Susa Day, MD (Orthopedic Surgery) Mauro Kaufmann, RN as Oncology Nurse Navigator Rockwell Germany, RN as Oncology Nurse Navigator  DIAGNOSIS:    ICD-10-CM   1. Malignant neoplasm of overlapping sites of left breast in female, estrogen receptor negative ([place])  C50.812    Z17.1     SUMMARY OF ONCOLOGIC HISTORY: Oncology History  Malignant neoplasm of overlapping sites of left breast in female, estrogen receptor negative ([place])  08/01/2019 Initial Diagnosis   Patient palpated left breast lump and skin thickening. Mammogram showed a 3.6cm mass at 3:30 position, a 0.9cm mass at 3:00 position, a 0.7cm mass at 2:00 position, a 0.5cm mass at 2:00 position, a 0.3cm mass at 1:00 position, and a 0.4cm mass at 1:00 position, with 4 abnormal left axillary lymph nodes. Biopsy showed IDC, grade 3, HER-2 + (3+), ER/PR -, Ki67 70%, in the breast and lymph nodes.    08/06/2019 Cancer Staging   Staging form: Breast, AJCC 8th Edition - Clinical: Stage IIIB (cT4, cN1, cM0, G3, ER-, PR-, HER2+) - Signed by Nicholas Lose, MD on 08/06/2019   08/19/2019 -  Chemotherapy   The patient had palonosetron (ALOXI) injection 0.25 mg, 0.25 mg, Intravenous,  Once, 3 of 6 cycles Administration: 0.25 mg (08/19/2019), 0.25 mg (09/08/2019), 0.25 mg (10/31/2019) pegfilgrastim-cbqv (UDENYCA) injection 6 mg, 6 mg, Subcutaneous, Once, 3 of 6 cycles Administration: 6 mg (08/21/2019), 6 mg (09/10/2019) CARBOplatin (PARAPLATIN) 480 mg in sodium chloride 0.9 % 250 mL chemo infusion, 480 mg (110.9 % of original dose 429.6 mg), Intravenous,  Once, 2 of 2 cycles Dose modification:   (original dose 429.6 mg, Cycle 1) Administration: 480 mg (08/19/2019), 340 mg (09/08/2019) DOCEtaxel (TAXOTERE) 160 mg in sodium chloride 0.9 % 250 mL chemo infusion, 75 mg/m2 = 160 mg, Intravenous,  Once, 3 of 6 cycles Dose modification: 60 mg/m2 (original dose  75 mg/m2, Cycle 2, Reason: Dose not tolerated), 50 mg/m2 (original dose 75 mg/m2, Cycle 5, Reason: Dose not tolerated) Administration: 160 mg (08/19/2019), 130 mg (09/08/2019), 110 mg (10/31/2019) pertuzumab (PERJETA) 420 mg in sodium chloride 0.9 % 250 mL chemo infusion, 420 mg (100 % of original dose 420 mg), Intravenous, Once, 2 of 2 cycles Dose modification: 420 mg (original dose 420 mg, Cycle 1, Reason: Provider Judgment) Administration: 420 mg (08/19/2019), 420 mg (09/08/2019) fosaprepitant (EMEND) 150 mg, dexamethasone (DECADRON) 12 mg in sodium chloride 0.9 % 145 mL IVPB, , Intravenous,  Once, 3 of 6 cycles Administration:  (08/19/2019),  (09/08/2019),  (10/31/2019) trastuzumab-anns (KANJINTI) 750 mg in sodium chloride 0.9 % 250 mL chemo infusion, 756 mg (100 % of original dose 8 mg/kg), Intravenous,  Once, 3 of 6 cycles Dose modification: 8 mg/kg (original dose 8 mg/kg, Cycle 1, Reason: Other (see comments), Comment: change to approved brand), 6 mg/kg (original dose 6 mg/kg, Cycle 2, Reason: Other (see comments), Comment: brand change per insurance) Administration: 750 mg (08/19/2019), 567 mg (09/08/2019), 567 mg (10/31/2019)  for chemotherapy treatment.    09/21/2019 - 10/03/2019 Hospital Admission   Intractable nausea and vomiting     CHIEF COMPLIANT: Cycle3Day 8 Taxotere and Herceptin   INTERVAL HISTORY: Lexia Vandevender is a 69 y.o. with above-mentioned history of left breast cancer.She is currently on neoadjuvant chemotherapywith Taxotere and Herceptin, as Carboplatin and Perjeta have been discontinued. She presents to the clinic todayfora toxicity check following cycle 3.    ALLERGIES:  is allergic to  penicillins; tetanus toxoid; and aspirin.  MEDICATIONS:  Current Outpatient Medications  Medication Sig Dispense Refill  . Albuterol Sulfate (PROAIR RESPICLICK) 505 (90 Base) MCG/ACT AEPB Inhale 2 puffs into the lungs every 6 (six) hours as needed. (Patient taking differently: Inhale  2 puffs into the lungs every 6 (six) hours as needed (SOB / wheezing). ) 2 each 1  . amLODipine (NORVASC) 10 MG tablet TAKE 1 TABLET BY MOUTH  DAILY (Patient taking differently: Take 10 mg by mouth daily. ) 90 tablet 3  . atorvastatin (LIPITOR) 20 MG tablet TAKE 1 TABLET BY MOUTH  DAILY (Patient taking differently: Take 20 mg by mouth daily at 6 PM. ) 90 tablet 1  . Carboxymethylcellul-Glycerin (CLEAR EYES FOR DRY EYES) 1-0.25 % SOLN Place 1 drop into both eyes 2 (two) times daily as needed (dry eyes).    . Cholecalciferol (VITAMIN D) 50 MCG (2000 UT) tablet Take 2,000 Units by mouth daily.    Marland Kitchen dexamethasone (DECADRON) 4 MG tablet Take 1 tablet (4 mg total) by mouth 2 (two) times daily. Take 1 tablet day before chemo and 1 tablet day after chemo with food 12 tablet 0  . fexofenadine (ALLEGRA) 180 MG tablet Take 180 mg by mouth daily as needed for allergies.     . fluticasone (FLONASE) 50 MCG/ACT nasal spray USE 2 SPRAYS IN EACH NOSTRIL DAILY. PT NEEDS TO SCHEDULE A FOLLOW UP APPT BEFORE NEXT REFILL. (Patient taking differently: Place 2 sprays into both nostrils daily. ) 16 g 0  . furosemide (LASIX) 40 MG tablet TAKE 1 TABLET BY MOUTH  DAILY (Patient taking differently: Take 40 mg by mouth daily. ) 90 tablet 1  . hydrALAZINE (APRESOLINE) 50 MG tablet Take 0.5 tablets (25 mg total) by mouth 3 (three) times daily. 1 tablet 0  . lidocaine-prilocaine (EMLA) cream Apply to affected area once 30 g 3  . LORazepam (ATIVAN) 0.5 MG tablet Take 1 tablet (0.5 mg total) by mouth at bedtime as needed for sleep. 30 tablet 0  . metoprolol tartrate (LOPRESSOR) 50 MG tablet TAKE 1 TABLET BY MOUTH  TWICE DAILY (Patient taking differently: Take 50 mg by mouth 2 (two) times daily. ) 180 tablet 1  . ondansetron (ZOFRAN) 8 MG tablet Take 1 tablet (8 mg total) by mouth 2 (two) times daily as needed (Nausea or vomiting). Begin 4 days after chemotherapy. 30 tablet 1  . potassium chloride SA (KLOR-CON) 20 MEQ tablet TAKE 1  TABLET BY MOUTH  DAILY (Patient taking differently: Take 20 mEq by mouth daily. ) 90 tablet 3  . prochlorperazine (COMPAZINE) 10 MG tablet Take 1 tablet (10 mg total) by mouth every 6 (six) hours as needed (Nausea or vomiting). 30 tablet 1  . sodium chloride (OCEAN) 0.65 % SOLN nasal spray Place 1 spray into both nostrils as needed for congestion.    Marland Kitchen warfarin (COUMADIN) 5 MG tablet Take 1 tablet daily or as directed by anticoagulation clinic. (Patient taking differently: Take 2.5-5 mg by mouth See admin instructions. Take 1 tablet daily or as directed by anticoagulation clinic; 5 mg all days except taking 2.5 mg on Wed) 100 tablet 1   No current facility-administered medications for this visit.    PHYSICAL EXAMINATION: ECOG PERFORMANCE STATUS: 1 - Symptomatic but completely ambulatory  Vitals:   11/07/19 0951  BP: (!) 129/58  Pulse: 67  Resp: 18  Temp: 98.7 F (37.1 C)  SpO2: 100%   Filed Weights   11/07/19 0951  Weight: 191 lb  12.8 oz (87 kg)    LABORATORY DATA:  I have reviewed the data as listed CMP Latest Ref Rng & Units 10/31/2019 10/02/2019 10/01/2019  Glucose 70 - 99 mg/dL 112(H) 129(H) 155(H)  BUN 8 - 23 mg/dL _0 Creatinine 0.44 - 1.00 mg/dL 1.62(H) 2.30(H) 2.18(H)  Sodium 135 - 145 mmol/L 145 136 127(L)  Potassium 3.5 - 5.1 mmol/L 3.3(L) 3.5 3.8  Chloride 98 - 111 mmol/L 109 102 100  CO2 22 - 32 mmol/L 26 23 21(L)  Calcium 8.9 - 10.3 mg/dL 8.5(L) 7.9(L) 7.6(L)  Total Protein 6.5 - 8.1 g/dL 6.5 - 4.9(L)  Total Bilirubin 0.3 - 1.2 mg/dL 0.2(L) - 0.6  Alkaline Phos 38 - 126 U/L 128(H) - 64  AST 15 - 41 U/L 12(L) - 14(L)  ALT 0 - 44 U/L 7 - 25    Lab Results  Component Value Date   WBC 3.5 (L) 11/07/2019   HGB 8.5 (L) 11/07/2019   HCT 28.2 (L) 11/07/2019   MCV 97.6 11/07/2019   PLT 216 11/07/2019   NEUTROABS 1.2 (L) 11/07/2019    ASSESSMENT & PLAN:  Malignant neoplasm of overlapping sites of left breast in female, estrogen receptor negative  ([place]) 08/01/2019:Patient palpated left breast lump and skin thickening. Mammogram showed a 3.6cm mass at 3:30 position, a 0.9cm mass at 3:00 position, a 0.7cm mass at 2:00 position, a 0.5cm mass at 2:00 position, a 0.3cm mass at 1:00 position, and a 0.4cm mass at 1:00 position, with 4 abnormal left axillary lymph nodes. Biopsy showed IDC, grade 3, HER-2 + (3+), ER/PR -, Ki67 70%, in the breast and lymph nodes.  Treatment plan 1. Neoadjuvant chemotherapy with TCH Perjeta 6 cycles followed by HerceptinPerjetamaintenance versus Kadcyla maintenance (depending on response to chemo)for 1 year 2. Followed bymastectomy withaxillary lymph node dissection 3. Followed by adjuvant radiation therapy  CT CAP 08/12/2019: 3.9 cm left breast mass with left axillary lymph nodes. 10 mm lesion upper pole of left kidney suspicious for small renal cell cancer. 3 mm left lower lobe nodule, 4 mm subcapsular lesion anterior hepatic dome too small Bone scan 08/15/2019: Benign Breast MRI 08/12/2019: Locally advanced left breast cancer 4 cm, multiple irregular enhancing masses, diffuse skin thickening and enhancement and multiple pathogenic level 1 axillary lymph nodes at least 5-6 -------------------------------------------------------------------------------------------------------------------------------------------------- Current treatment: Cycle 3 day 8 TCH Perjeta (we will discontinue Perjeta and carboplatin and reduced the dosage of Taxotere) Chemo toxicities: 1.  Hospitalization after second cycle of chemo 09/21/2019: For intractable nausea and vomiting: Carboplatin was discontinued and dose of Taxotere reduced.   2.  Diarrhea and dehydration: Perjeta discontinued: She had one episode of diarrhea.  We are giving her IV fluids today. 3.  Loss of taste and appetite: Being monitored 4.  Profound fatigue 5 .  Chemotherapy-induced anemia: Today's hemoglobin is 8.5 has decreased from 9.1.  We are monitoring  it.  Chronic renal failure: Being monitored closely.  Return to clinic in 2 weeks for cycle 4    No orders of the defined types were placed in this encounter.  The patient has a good understanding of the overall plan. she agrees with it. she will call with any problems that may develop before the next visit here.  Total time spent: 30 mins including face to face time and time spent for planning, charting and coordination of care  Nicholas Lose, MD 11/07/2019  I, Cloyde Reams Dorshimer, am acting as scribe for Dr. Nicholas Lose.  I have reviewed the  above documentation for accuracy and completeness, and I agree with the above.       

## 2019-11-07 ENCOUNTER — Inpatient Hospital Stay: Payer: Medicare Other

## 2019-11-07 ENCOUNTER — Encounter: Payer: Self-pay | Admitting: *Deleted

## 2019-11-07 ENCOUNTER — Other Ambulatory Visit: Payer: Self-pay

## 2019-11-07 ENCOUNTER — Inpatient Hospital Stay: Payer: Medicare Other | Attending: Hematology and Oncology

## 2019-11-07 ENCOUNTER — Inpatient Hospital Stay (HOSPITAL_BASED_OUTPATIENT_CLINIC_OR_DEPARTMENT_OTHER): Payer: Medicare Other | Admitting: Hematology and Oncology

## 2019-11-07 DIAGNOSIS — Z171 Estrogen receptor negative status [ER-]: Secondary | ICD-10-CM | POA: Diagnosis not present

## 2019-11-07 DIAGNOSIS — C50812 Malignant neoplasm of overlapping sites of left female breast: Secondary | ICD-10-CM

## 2019-11-07 DIAGNOSIS — R439 Unspecified disturbances of smell and taste: Secondary | ICD-10-CM | POA: Diagnosis not present

## 2019-11-07 DIAGNOSIS — Z803 Family history of malignant neoplasm of breast: Secondary | ICD-10-CM | POA: Insufficient documentation

## 2019-11-07 DIAGNOSIS — Z7901 Long term (current) use of anticoagulants: Secondary | ICD-10-CM | POA: Insufficient documentation

## 2019-11-07 DIAGNOSIS — T451X5A Adverse effect of antineoplastic and immunosuppressive drugs, initial encounter: Secondary | ICD-10-CM | POA: Diagnosis not present

## 2019-11-07 DIAGNOSIS — Z808 Family history of malignant neoplasm of other organs or systems: Secondary | ICD-10-CM | POA: Diagnosis not present

## 2019-11-07 DIAGNOSIS — Z86718 Personal history of other venous thrombosis and embolism: Secondary | ICD-10-CM | POA: Insufficient documentation

## 2019-11-07 DIAGNOSIS — Z5111 Encounter for antineoplastic chemotherapy: Secondary | ICD-10-CM | POA: Diagnosis not present

## 2019-11-07 DIAGNOSIS — R197 Diarrhea, unspecified: Secondary | ICD-10-CM | POA: Diagnosis not present

## 2019-11-07 DIAGNOSIS — Z90710 Acquired absence of both cervix and uterus: Secondary | ICD-10-CM | POA: Diagnosis not present

## 2019-11-07 DIAGNOSIS — Z87891 Personal history of nicotine dependence: Secondary | ICD-10-CM | POA: Insufficient documentation

## 2019-11-07 DIAGNOSIS — Z5112 Encounter for antineoplastic immunotherapy: Secondary | ICD-10-CM | POA: Diagnosis not present

## 2019-11-07 DIAGNOSIS — D6481 Anemia due to antineoplastic chemotherapy: Secondary | ICD-10-CM | POA: Diagnosis not present

## 2019-11-07 DIAGNOSIS — Z9012 Acquired absence of left breast and nipple: Secondary | ICD-10-CM | POA: Insufficient documentation

## 2019-11-07 DIAGNOSIS — Z79899 Other long term (current) drug therapy: Secondary | ICD-10-CM | POA: Diagnosis not present

## 2019-11-07 DIAGNOSIS — R040 Epistaxis: Secondary | ICD-10-CM | POA: Diagnosis not present

## 2019-11-07 DIAGNOSIS — Z8049 Family history of malignant neoplasm of other genital organs: Secondary | ICD-10-CM | POA: Diagnosis not present

## 2019-11-07 DIAGNOSIS — N189 Chronic kidney disease, unspecified: Secondary | ICD-10-CM | POA: Insufficient documentation

## 2019-11-07 DIAGNOSIS — Z95828 Presence of other vascular implants and grafts: Secondary | ICD-10-CM

## 2019-11-07 DIAGNOSIS — E860 Dehydration: Secondary | ICD-10-CM | POA: Diagnosis not present

## 2019-11-07 DIAGNOSIS — R5383 Other fatigue: Secondary | ICD-10-CM | POA: Diagnosis not present

## 2019-11-07 DIAGNOSIS — Z800 Family history of malignant neoplasm of digestive organs: Secondary | ICD-10-CM | POA: Diagnosis not present

## 2019-11-07 LAB — CBC WITH DIFFERENTIAL (CANCER CENTER ONLY)
Abs Immature Granulocytes: 0.03 10*3/uL (ref 0.00–0.07)
Basophils Absolute: 0 10*3/uL (ref 0.0–0.1)
Basophils Relative: 1 %
Eosinophils Absolute: 0 10*3/uL (ref 0.0–0.5)
Eosinophils Relative: 0 %
HCT: 28.2 % — ABNORMAL LOW (ref 36.0–46.0)
Hemoglobin: 8.5 g/dL — ABNORMAL LOW (ref 12.0–15.0)
Immature Granulocytes: 1 %
Lymphocytes Relative: 60 %
Lymphs Abs: 2.1 10*3/uL (ref 0.7–4.0)
MCH: 29.4 pg (ref 26.0–34.0)
MCHC: 30.1 g/dL (ref 30.0–36.0)
MCV: 97.6 fL (ref 80.0–100.0)
Monocytes Absolute: 0.1 10*3/uL (ref 0.1–1.0)
Monocytes Relative: 3 %
Neutro Abs: 1.2 10*3/uL — ABNORMAL LOW (ref 1.7–7.7)
Neutrophils Relative %: 35 %
Platelet Count: 216 10*3/uL (ref 150–400)
RBC: 2.89 MIL/uL — ABNORMAL LOW (ref 3.87–5.11)
RDW: 16.2 % — ABNORMAL HIGH (ref 11.5–15.5)
WBC Count: 3.5 10*3/uL — ABNORMAL LOW (ref 4.0–10.5)
nRBC: 0 % (ref 0.0–0.2)

## 2019-11-07 LAB — CMP (CANCER CENTER ONLY)
ALT: 15 U/L (ref 0–44)
AST: 13 U/L — ABNORMAL LOW (ref 15–41)
Albumin: 3.2 g/dL — ABNORMAL LOW (ref 3.5–5.0)
Alkaline Phosphatase: 96 U/L (ref 38–126)
Anion gap: 8 (ref 5–15)
BUN: 13 mg/dL (ref 8–23)
CO2: 29 mmol/L (ref 22–32)
Calcium: 8.6 mg/dL — ABNORMAL LOW (ref 8.9–10.3)
Chloride: 110 mmol/L (ref 98–111)
Creatinine: 1.6 mg/dL — ABNORMAL HIGH (ref 0.44–1.00)
GFR, Est AFR Am: 38 mL/min — ABNORMAL LOW (ref >60)
GFR, Estimated: 33 mL/min — ABNORMAL LOW (ref >60)
Glucose, Bld: 95 mg/dL (ref 70–99)
Potassium: 4 mmol/L (ref 3.5–5.1)
Sodium: 147 mmol/L — ABNORMAL HIGH (ref 135–145)
Total Bilirubin: 0.3 mg/dL (ref 0.3–1.2)
Total Protein: 6.3 g/dL — ABNORMAL LOW (ref 6.5–8.1)

## 2019-11-07 LAB — PROTIME-INR
INR: 1.9 — ABNORMAL HIGH (ref 0.8–1.2)
Prothrombin Time: 21.8 seconds — ABNORMAL HIGH (ref 11.4–15.2)

## 2019-11-07 MED ORDER — SODIUM CHLORIDE 0.9 % IV SOLN
Freq: Once | INTRAVENOUS | Status: AC
  Filled 2019-11-07: qty 250

## 2019-11-07 MED ORDER — SODIUM CHLORIDE 0.9% FLUSH
10.0000 mL | INTRAVENOUS | Status: DC | PRN
  Administered 2019-11-07: 10 mL
  Filled 2019-11-07: qty 10

## 2019-11-07 MED ORDER — HEPARIN SOD (PORK) LOCK FLUSH 100 UNIT/ML IV SOLN
500.0000 [IU] | Freq: Once | INTRAVENOUS | Status: AC | PRN
  Administered 2019-11-07: 500 [IU]
  Filled 2019-11-07: qty 5

## 2019-11-07 NOTE — Patient Instructions (Signed)

## 2019-11-07 NOTE — Assessment & Plan Note (Signed)
08/01/2019:Patient palpated left breast lump and skin thickening. Mammogram showed a 3.6cm mass at 3:30 position, a 0.9cm mass at 3:00 position, a 0.7cm mass at 2:00 position, a 0.5cm mass at 2:00 position, a 0.3cm mass at 1:00 position, and a 0.4cm mass at 1:00 position, with 4 abnormal left axillary lymph nodes. Biopsy showed IDC, grade 3, HER-2 + (3+), ER/PR -, Ki67 70%, in the breast and lymph nodes.  Treatment plan 1. Neoadjuvant chemotherapy with TCH Perjeta 6 cycles followed by HerceptinPerjetamaintenance versus Kadcyla maintenance (depending on response to chemo)for 1 year 2. Followed bymastectomy withaxillary lymph node dissection 3. Followed by adjuvant radiation therapy  CT CAP 08/12/2019: 3.9 cm left breast mass with left axillary lymph nodes. 10 mm lesion upper pole of left kidney suspicious for small renal cell cancer. 3 mm left lower lobe nodule, 4 mm subcapsular lesion anterior hepatic dome too small Bone scan 08/15/2019: Benign Breast MRI 08/12/2019: Locally advanced left breast cancer 4 cm, multiple irregular enhancing masses, diffuse skin thickening and enhancement and multiple pathogenic level 1 axillary lymph nodes at least 5-6 -------------------------------------------------------------------------------------------------------------------------------------------------- Current treatment: Cycle 3 day 8 TCH Perjeta (we will discontinue Perjeta and carboplatin and reduced the dosage of Taxotere) Chemo toxicities: 1.  Hospitalization after second cycle of chemo 09/21/2019: For intractable nausea and vomiting: Carboplatin was discontinued and dose of Taxotere reduced.   2.  Diarrhea and dehydration: Perjeta discontinued 3.  Loss of taste and appetite: Being monitored 4.  Profound fatigue  Chronic renal failure: Being monitored closely.  Return to clinic in 2 weeks for cycle 4

## 2019-11-20 ENCOUNTER — Other Ambulatory Visit: Payer: Medicare Other

## 2019-11-20 ENCOUNTER — Encounter: Payer: Self-pay | Admitting: *Deleted

## 2019-11-20 ENCOUNTER — Telehealth: Payer: Self-pay | Admitting: Hematology and Oncology

## 2019-11-20 NOTE — Progress Notes (Signed)
Patient Care Team: Panosh, Standley Brooking, MD as PCP - General Stanford Breed, Denice Bors, MD (Cardiology) Susa Day, MD (Orthopedic Surgery) Mauro Kaufmann, RN as Oncology Nurse Navigator Rockwell Germany, RN as Oncology Nurse Navigator  DIAGNOSIS:    ICD-10-CM   1. Malignant neoplasm of overlapping sites of left breast in female, estrogen receptor negative ([place])  C50.812    Z17.1     SUMMARY OF ONCOLOGIC HISTORY: Oncology History  Malignant neoplasm of overlapping sites of left breast in female, estrogen receptor negative ([place])  08/01/2019 Initial Diagnosis   Patient palpated left breast lump and skin thickening. Mammogram showed a 3.6cm mass at 3:30 position, a 0.9cm mass at 3:00 position, a 0.7cm mass at 2:00 position, a 0.5cm mass at 2:00 position, a 0.3cm mass at 1:00 position, and a 0.4cm mass at 1:00 position, with 4 abnormal left axillary lymph nodes. Biopsy showed IDC, grade 3, HER-2 + (3+), ER/PR -, Ki67 70%, in the breast and lymph nodes.    08/06/2019 Cancer Staging   Staging form: Breast, AJCC 8th Edition - Clinical: Stage IIIB (cT4, cN1, cM0, G3, ER-, PR-, HER2+) - Signed by Nicholas Lose, MD on 08/06/2019   08/19/2019 -  Chemotherapy   The patient had palonosetron (ALOXI) injection 0.25 mg, 0.25 mg, Intravenous,  Once, 3 of 6 cycles Administration: 0.25 mg (08/19/2019), 0.25 mg (09/08/2019), 0.25 mg (10/31/2019) pegfilgrastim-cbqv (UDENYCA) injection 6 mg, 6 mg, Subcutaneous, Once, 2 of 5 cycles Administration: 6 mg (08/21/2019), 6 mg (09/10/2019) CARBOplatin (PARAPLATIN) 480 mg in sodium chloride 0.9 % 250 mL chemo infusion, 480 mg (110.9 % of original dose 429.6 mg), Intravenous,  Once, 2 of 2 cycles Dose modification:   (original dose 429.6 mg, Cycle 1) Administration: 480 mg (08/19/2019), 340 mg (09/08/2019) DOCEtaxel (TAXOTERE) 160 mg in sodium chloride 0.9 % 250 mL chemo infusion, 75 mg/m2 = 160 mg, Intravenous,  Once, 3 of 6 cycles Dose modification: 60 mg/m2 (original dose  75 mg/m2, Cycle 2, Reason: Dose not tolerated), 50 mg/m2 (original dose 75 mg/m2, Cycle 5, Reason: Dose not tolerated) Administration: 160 mg (08/19/2019), 130 mg (09/08/2019), 110 mg (10/31/2019) pertuzumab (PERJETA) 420 mg in sodium chloride 0.9 % 250 mL chemo infusion, 420 mg (100 % of original dose 420 mg), Intravenous, Once, 2 of 2 cycles Dose modification: 420 mg (original dose 420 mg, Cycle 1, Reason: Provider Judgment) Administration: 420 mg (08/19/2019), 420 mg (09/08/2019) fosaprepitant (EMEND) 150 mg, dexamethasone (DECADRON) 12 mg in sodium chloride 0.9 % 145 mL IVPB, , Intravenous,  Once, 3 of 6 cycles Administration:  (08/19/2019),  (09/08/2019),  (10/31/2019) trastuzumab-anns (KANJINTI) 750 mg in sodium chloride 0.9 % 250 mL chemo infusion, 756 mg (100 % of original dose 8 mg/kg), Intravenous,  Once, 3 of 6 cycles Dose modification: 8 mg/kg (original dose 8 mg/kg, Cycle 1, Reason: Other (see comments), Comment: change to approved brand), 6 mg/kg (original dose 6 mg/kg, Cycle 2, Reason: Other (see comments), Comment: brand change per insurance) Administration: 750 mg (08/19/2019), 567 mg (09/08/2019), 567 mg (10/31/2019)  for chemotherapy treatment.    09/21/2019 - 10/03/2019 Hospital Admission   Intractable nausea and vomiting     CHIEF COMPLIANT: Cycle4 Taxotere and Herceptin   INTERVAL HISTORY: [name] Foster is a 69 y.o. with above-mentioned history of left breast cancer.She is currently on neoadjuvant chemotherapywith Taxotere and Herceptin, as Carboplatin and Perjeta have been discontinued.She presents to the clinic todayfor cycle4.She has tolerated last cycle of chemo extremely well.  She did not have any  nausea or diarrhea.  She has been eating and drinking quite well.  Denies neuropathy.  ALLERGIES:  is allergic to penicillins; tetanus toxoid; and aspirin.  MEDICATIONS:  Current Outpatient Medications  Medication Sig Dispense Refill  . Albuterol Sulfate (PROAIR  RESPICLICK) 829 (90 Base) MCG/ACT AEPB Inhale 2 puffs into the lungs every 6 (six) hours as needed. (Patient taking differently: Inhale 2 puffs into the lungs every 6 (six) hours as needed (SOB / wheezing). ) 2 each 1  . amLODipine (NORVASC) 10 MG tablet TAKE 1 TABLET BY MOUTH  DAILY (Patient taking differently: Take 10 mg by mouth daily. ) 90 tablet 3  . atorvastatin (LIPITOR) 20 MG tablet TAKE 1 TABLET BY MOUTH  DAILY (Patient taking differently: Take 20 mg by mouth daily at 6 PM. ) 90 tablet 1  . Carboxymethylcellul-Glycerin (CLEAR EYES FOR DRY EYES) 1-0.25 % SOLN Place 1 drop into both eyes 2 (two) times daily as needed (dry eyes).    . Cholecalciferol (VITAMIN D) 50 MCG (2000 UT) tablet Take 2,000 Units by mouth daily.    Marland Kitchen dexamethasone (DECADRON) 4 MG tablet Take 1 tablet (4 mg total) by mouth 2 (two) times daily. Take 1 tablet day before chemo and 1 tablet day after chemo with food 12 tablet 0  . fexofenadine (ALLEGRA) 180 MG tablet Take 180 mg by mouth daily as needed for allergies.     . fluticasone (FLONASE) 50 MCG/ACT nasal spray USE 2 SPRAYS IN EACH NOSTRIL DAILY. PT NEEDS TO SCHEDULE A FOLLOW UP APPT BEFORE NEXT REFILL. (Patient taking differently: Place 2 sprays into both nostrils daily. ) 16 g 0  . furosemide (LASIX) 40 MG tablet TAKE 1 TABLET BY MOUTH  DAILY (Patient taking differently: Take 40 mg by mouth daily. ) 90 tablet 1  . hydrALAZINE (APRESOLINE) 50 MG tablet Take 0.5 tablets (25 mg total) by mouth 3 (three) times daily. 1 tablet 0  . lidocaine-prilocaine (EMLA) cream Apply to affected area once 30 g 3  . LORazepam (ATIVAN) 0.5 MG tablet Take 1 tablet (0.5 mg total) by mouth at bedtime as needed for sleep. 30 tablet 0  . metoprolol tartrate (LOPRESSOR) 50 MG tablet TAKE 1 TABLET BY MOUTH  TWICE DAILY (Patient taking differently: Take 50 mg by mouth 2 (two) times daily. ) 180 tablet 1  . ondansetron (ZOFRAN) 8 MG tablet Take 1 tablet (8 mg total) by mouth 2 (two) times daily as  needed (Nausea or vomiting). Begin 4 days after chemotherapy. 30 tablet 1  . potassium chloride SA (KLOR-CON) 20 MEQ tablet TAKE 1 TABLET BY MOUTH  DAILY (Patient taking differently: Take 20 mEq by mouth daily. ) 90 tablet 3  . prochlorperazine (COMPAZINE) 10 MG tablet Take 1 tablet (10 mg total) by mouth every 6 (six) hours as needed (Nausea or vomiting). 30 tablet 1  . sodium chloride (OCEAN) 0.65 % SOLN nasal spray Place 1 spray into both nostrils as needed for congestion.    Marland Kitchen warfarin (COUMADIN) 5 MG tablet Take 1 tablet daily or as directed by anticoagulation clinic. (Patient taking differently: Take 2.5-5 mg by mouth See admin instructions. Take 1 tablet daily or as directed by anticoagulation clinic; 5 mg all days except taking 2.5 mg on Wed) 100 tablet 1   No current facility-administered medications for this visit.    PHYSICAL EXAMINATION: ECOG PERFORMANCE STATUS: 1 - Symptomatic but completely ambulatory  Vitals:   11/21/19 1108  BP: (!) 144/69  Pulse: 67  Resp: 18  Temp: 97.8 F (36.6 C)  SpO2: 100%   Filed Weights   11/21/19 1108  Weight: 193 lb 6.4 oz (87.7 kg)    LABORATORY DATA:  I have reviewed the data as listed CMP Latest Ref Rng & Units 11/21/2019 11/07/2019 10/31/2019  Glucose 70 - 99 mg/dL 102(H) 95 112(H)  BUN 8 - 23 mg/dL _0 Creatinine 0.44 - 1.00 mg/dL 1.72(H) 1.60(H) 1.62(H)  Sodium 135 - 145 mmol/L 147(H) 147(H) 145  Potassium 3.5 - 5.1 mmol/L 3.7 4.0 3.3(L)  Chloride 98 - 111 mmol/L 110 110 109  CO2 22 - 32 mmol/L _1 Calcium 8.9 - 10.3 mg/dL 8.8(L) 8.6(L) 8.5(L)  Total Protein 6.5 - 8.1 g/dL 6.6 6.3(L) 6.5  Total Bilirubin 0.3 - 1.2 mg/dL <0.2(L) 0.3 0.2(L)  Alkaline Phos 38 - 126 U/L 114 96 128(H)  AST 15 - 41 U/L 13(L) 13(L) 12(L)  ALT 0 - 44 U/L _2 Lab Results  Component Value Date   WBC 6.9 11/21/2019   HGB 9.4 (L) 11/21/2019   HCT 30.8 (L) 11/21/2019   MCV 97.8 11/21/2019   PLT 300 11/21/2019   NEUTROABS 4.0  11/21/2019    ASSESSMENT & PLAN:  Malignant neoplasm of overlapping sites of left breast in female, estrogen receptor negative ([place]) 08/01/2019:Patient palpated left breast lump and skin thickening. Mammogram showed a 3.6cm mass at 3:30 position, a 0.9cm mass at 3:00 position, a 0.7cm mass at 2:00 position, a 0.5cm mass at 2:00 position, a 0.3cm mass at 1:00 position, and a 0.4cm mass at 1:00 position, with 4 abnormal left axillary lymph nodes. Biopsy showed IDC, grade 3, HER-2 + (3+), ER/PR -, Ki67 70%, in the breast and lymph nodes.  Treatment plan 1. Neoadjuvant chemotherapy with TCH Perjeta 6 cycles followed by HerceptinPerjetamaintenance versus Kadcyla maintenance (depending on response to chemo)for 1 year 2. Followed bymastectomy withaxillary lymph node dissection 3. Followed by adjuvant radiation therapy  CT CAP 08/12/2019: 3.9 cm left breast mass with left axillary lymph nodes. 10 mm lesion upper pole of left kidney suspicious for small renal cell cancer. 3 mm left lower lobe nodule, 4 mm subcapsular lesion anterior hepatic dome too small Bone scan 08/15/2019: Benign Breast MRI 08/12/2019: Locally advanced left breast cancer 4 cm, multiple irregular enhancing masses, diffuse skin thickening and enhancement and multiple pathogenic level 1 axillary lymph nodes at least 5-6 -------------------------------------------------------------------------------------------------------------------------------------------------- Current treatment:Cycle 4 TCH Perjeta(we will discontinue Perjeta and carboplatin and reduced the dosage of Taxotere) Chemo toxicities: 1. Hospitalization after second cycle of chemo 09/21/2019: For intractable nausea and vomiting: Carboplatin was discontinued and dose of Taxotere reduced.   2. Diarrhea and dehydration:  No further issues with diarrhea 3. Loss of taste and appetite: Same as before 4. Profound fatigue: Stable 5 .  Chemotherapy-induced anemia:  Today's hemoglobin is 9.4  INR: 1.5, I encouraged her to increase the dosage of Coumadin to 7.5 mg on one of the 7 days.  The rest of the days she gets 5 mg. Chronic renal failure: Being monitored closely.  Okay to treat  Return to clinic in 2 weeks for cycle 5    No orders of the defined types were placed in this encounter.  The patient has a good understanding of the overall plan. she agrees with it. she will call with any problems that may develop before the next visit here.  Total time spent: 30 mins including face to face time and time spent for planning,  charting and coordination of care  Nicholas Lose, MD 11/21/2019  Julious Oka Dorshimer, am acting as scribe for Dr. Nicholas Lose.  I have reviewed the above documentation for accuracy and completeness, and I agree with the above.

## 2019-11-20 NOTE — Telephone Encounter (Signed)
I had to leave patient a message regarding reschedule due to weather

## 2019-11-21 ENCOUNTER — Inpatient Hospital Stay: Payer: Medicare Other

## 2019-11-21 ENCOUNTER — Inpatient Hospital Stay (HOSPITAL_BASED_OUTPATIENT_CLINIC_OR_DEPARTMENT_OTHER): Payer: Medicare Other | Admitting: Hematology and Oncology

## 2019-11-21 ENCOUNTER — Encounter: Payer: Self-pay | Admitting: *Deleted

## 2019-11-21 ENCOUNTER — Other Ambulatory Visit: Payer: Self-pay

## 2019-11-21 ENCOUNTER — Inpatient Hospital Stay: Payer: Medicare Other | Admitting: Hematology and Oncology

## 2019-11-21 DIAGNOSIS — Z79899 Other long term (current) drug therapy: Secondary | ICD-10-CM | POA: Diagnosis not present

## 2019-11-21 DIAGNOSIS — Z803 Family history of malignant neoplasm of breast: Secondary | ICD-10-CM | POA: Diagnosis not present

## 2019-11-21 DIAGNOSIS — Z95828 Presence of other vascular implants and grafts: Secondary | ICD-10-CM

## 2019-11-21 DIAGNOSIS — T451X5A Adverse effect of antineoplastic and immunosuppressive drugs, initial encounter: Secondary | ICD-10-CM | POA: Diagnosis not present

## 2019-11-21 DIAGNOSIS — Z86718 Personal history of other venous thrombosis and embolism: Secondary | ICD-10-CM | POA: Diagnosis not present

## 2019-11-21 DIAGNOSIS — N189 Chronic kidney disease, unspecified: Secondary | ICD-10-CM | POA: Diagnosis not present

## 2019-11-21 DIAGNOSIS — R197 Diarrhea, unspecified: Secondary | ICD-10-CM | POA: Diagnosis not present

## 2019-11-21 DIAGNOSIS — C50812 Malignant neoplasm of overlapping sites of left female breast: Secondary | ICD-10-CM | POA: Diagnosis not present

## 2019-11-21 DIAGNOSIS — E860 Dehydration: Secondary | ICD-10-CM | POA: Diagnosis not present

## 2019-11-21 DIAGNOSIS — Z5112 Encounter for antineoplastic immunotherapy: Secondary | ICD-10-CM | POA: Diagnosis not present

## 2019-11-21 DIAGNOSIS — Z171 Estrogen receptor negative status [ER-]: Secondary | ICD-10-CM

## 2019-11-21 DIAGNOSIS — Z808 Family history of malignant neoplasm of other organs or systems: Secondary | ICD-10-CM | POA: Diagnosis not present

## 2019-11-21 DIAGNOSIS — R5383 Other fatigue: Secondary | ICD-10-CM | POA: Diagnosis not present

## 2019-11-21 DIAGNOSIS — Z800 Family history of malignant neoplasm of digestive organs: Secondary | ICD-10-CM | POA: Diagnosis not present

## 2019-11-21 DIAGNOSIS — R439 Unspecified disturbances of smell and taste: Secondary | ICD-10-CM | POA: Diagnosis not present

## 2019-11-21 DIAGNOSIS — Z5111 Encounter for antineoplastic chemotherapy: Secondary | ICD-10-CM | POA: Diagnosis not present

## 2019-11-21 DIAGNOSIS — Z7901 Long term (current) use of anticoagulants: Secondary | ICD-10-CM | POA: Diagnosis not present

## 2019-11-21 DIAGNOSIS — Z9012 Acquired absence of left breast and nipple: Secondary | ICD-10-CM | POA: Diagnosis not present

## 2019-11-21 DIAGNOSIS — Z87891 Personal history of nicotine dependence: Secondary | ICD-10-CM | POA: Diagnosis not present

## 2019-11-21 DIAGNOSIS — D6481 Anemia due to antineoplastic chemotherapy: Secondary | ICD-10-CM | POA: Diagnosis not present

## 2019-11-21 DIAGNOSIS — R040 Epistaxis: Secondary | ICD-10-CM | POA: Diagnosis not present

## 2019-11-21 LAB — CBC WITH DIFFERENTIAL (CANCER CENTER ONLY)
Abs Immature Granulocytes: 0.09 10*3/uL — ABNORMAL HIGH (ref 0.00–0.07)
Basophils Absolute: 0 10*3/uL (ref 0.0–0.1)
Basophils Relative: 0 %
Eosinophils Absolute: 0 10*3/uL (ref 0.0–0.5)
Eosinophils Relative: 0 %
HCT: 30.8 % — ABNORMAL LOW (ref 36.0–46.0)
Hemoglobin: 9.4 g/dL — ABNORMAL LOW (ref 12.0–15.0)
Immature Granulocytes: 1 %
Lymphocytes Relative: 33 %
Lymphs Abs: 2.3 10*3/uL (ref 0.7–4.0)
MCH: 29.8 pg (ref 26.0–34.0)
MCHC: 30.5 g/dL (ref 30.0–36.0)
MCV: 97.8 fL (ref 80.0–100.0)
Monocytes Absolute: 0.5 10*3/uL (ref 0.1–1.0)
Monocytes Relative: 7 %
Neutro Abs: 4 10*3/uL (ref 1.7–7.7)
Neutrophils Relative %: 59 %
Platelet Count: 300 10*3/uL (ref 150–400)
RBC: 3.15 MIL/uL — ABNORMAL LOW (ref 3.87–5.11)
RDW: 15.4 % (ref 11.5–15.5)
WBC Count: 6.9 10*3/uL (ref 4.0–10.5)
nRBC: 0 % (ref 0.0–0.2)

## 2019-11-21 LAB — CMP (CANCER CENTER ONLY)
ALT: 10 U/L (ref 0–44)
AST: 13 U/L — ABNORMAL LOW (ref 15–41)
Albumin: 3.3 g/dL — ABNORMAL LOW (ref 3.5–5.0)
Alkaline Phosphatase: 114 U/L (ref 38–126)
Anion gap: 9 (ref 5–15)
BUN: 10 mg/dL (ref 8–23)
CO2: 28 mmol/L (ref 22–32)
Calcium: 8.8 mg/dL — ABNORMAL LOW (ref 8.9–10.3)
Chloride: 110 mmol/L (ref 98–111)
Creatinine: 1.72 mg/dL — ABNORMAL HIGH (ref 0.44–1.00)
GFR, Est AFR Am: 35 mL/min — ABNORMAL LOW (ref >60)
GFR, Estimated: 30 mL/min — ABNORMAL LOW (ref >60)
Glucose, Bld: 102 mg/dL — ABNORMAL HIGH (ref 70–99)
Potassium: 3.7 mmol/L (ref 3.5–5.1)
Sodium: 147 mmol/L — ABNORMAL HIGH (ref 135–145)
Total Bilirubin: <0.2 mg/dL — ABNORMAL LOW (ref 0.3–1.2)
Total Protein: 6.6 g/dL (ref 6.5–8.1)

## 2019-11-21 LAB — PROTIME-INR
INR: 1.5 — ABNORMAL HIGH (ref 0.8–1.2)
Prothrombin Time: 18.4 seconds — ABNORMAL HIGH (ref 11.4–15.2)

## 2019-11-21 MED ORDER — SODIUM CHLORIDE 0.9% FLUSH
10.0000 mL | INTRAVENOUS | Status: DC | PRN
  Filled 2019-11-21: qty 10

## 2019-11-21 NOTE — Progress Notes (Signed)
Per MD okay to treat on 11/22/19 with crt 1.72.

## 2019-11-21 NOTE — Assessment & Plan Note (Signed)
08/01/2019:Patient palpated left breast lump and skin thickening. Mammogram showed a 3.6cm mass at 3:30 position, a 0.9cm mass at 3:00 position, a 0.7cm mass at 2:00 position, a 0.5cm mass at 2:00 position, a 0.3cm mass at 1:00 position, and a 0.4cm mass at 1:00 position, with 4 abnormal left axillary lymph nodes. Biopsy showed IDC, grade 3, HER-2 + (3+), ER/PR -, Ki67 70%, in the breast and lymph nodes.  Treatment plan 1. Neoadjuvant chemotherapy with TCH Perjeta 6 cycles followed by HerceptinPerjetamaintenance versus Kadcyla maintenance (depending on response to chemo)for 1 year 2. Followed bymastectomy withaxillary lymph node dissection 3. Followed by adjuvant radiation therapy  CT CAP 08/12/2019: 3.9 cm left breast mass with left axillary lymph nodes. 10 mm lesion upper pole of left kidney suspicious for small renal cell cancer. 3 mm left lower lobe nodule, 4 mm subcapsular lesion anterior hepatic dome too small Bone scan 08/15/2019: Benign Breast MRI 08/12/2019: Locally advanced left breast cancer 4 cm, multiple irregular enhancing masses, diffuse skin thickening and enhancement and multiple pathogenic level 1 axillary lymph nodes at least 5-6 -------------------------------------------------------------------------------------------------------------------------------------------------- Current treatment:Cycle 4 TCH Perjeta(we will discontinue Perjeta and carboplatin and reduced the dosage of Taxotere) Chemo toxicities: 1. Hospitalization after second cycle of chemo 09/21/2019: For intractable nausea and vomiting: Carboplatin was discontinued and dose of Taxotere reduced.   2. Diarrhea and dehydration:  No further issues with diarrhea 3. Loss of taste and appetite: Same as before 4. Profound fatigue: Stable 5 .  Chemotherapy-induced anemia: Today's hemoglobin is   Chronic renal failure: Being monitored closely.  Return to clinic in 2 weeks for cycle 5

## 2019-11-22 ENCOUNTER — Other Ambulatory Visit: Payer: Self-pay

## 2019-11-22 ENCOUNTER — Inpatient Hospital Stay: Payer: Medicare Other

## 2019-11-22 VITALS — BP 131/66 | HR 66 | Temp 98.6°F | Resp 16

## 2019-11-22 DIAGNOSIS — R040 Epistaxis: Secondary | ICD-10-CM | POA: Diagnosis not present

## 2019-11-22 DIAGNOSIS — Z808 Family history of malignant neoplasm of other organs or systems: Secondary | ICD-10-CM | POA: Diagnosis not present

## 2019-11-22 DIAGNOSIS — C50812 Malignant neoplasm of overlapping sites of left female breast: Secondary | ICD-10-CM | POA: Diagnosis not present

## 2019-11-22 DIAGNOSIS — Z79899 Other long term (current) drug therapy: Secondary | ICD-10-CM | POA: Diagnosis not present

## 2019-11-22 DIAGNOSIS — Z87891 Personal history of nicotine dependence: Secondary | ICD-10-CM | POA: Diagnosis not present

## 2019-11-22 DIAGNOSIS — D6481 Anemia due to antineoplastic chemotherapy: Secondary | ICD-10-CM | POA: Diagnosis not present

## 2019-11-22 DIAGNOSIS — E860 Dehydration: Secondary | ICD-10-CM | POA: Diagnosis not present

## 2019-11-22 DIAGNOSIS — R439 Unspecified disturbances of smell and taste: Secondary | ICD-10-CM | POA: Diagnosis not present

## 2019-11-22 DIAGNOSIS — Z9012 Acquired absence of left breast and nipple: Secondary | ICD-10-CM | POA: Diagnosis not present

## 2019-11-22 DIAGNOSIS — R197 Diarrhea, unspecified: Secondary | ICD-10-CM | POA: Diagnosis not present

## 2019-11-22 DIAGNOSIS — N189 Chronic kidney disease, unspecified: Secondary | ICD-10-CM | POA: Diagnosis not present

## 2019-11-22 DIAGNOSIS — Z5112 Encounter for antineoplastic immunotherapy: Secondary | ICD-10-CM | POA: Diagnosis not present

## 2019-11-22 DIAGNOSIS — Z5111 Encounter for antineoplastic chemotherapy: Secondary | ICD-10-CM | POA: Diagnosis not present

## 2019-11-22 DIAGNOSIS — Z800 Family history of malignant neoplasm of digestive organs: Secondary | ICD-10-CM | POA: Diagnosis not present

## 2019-11-22 DIAGNOSIS — Z171 Estrogen receptor negative status [ER-]: Secondary | ICD-10-CM | POA: Diagnosis not present

## 2019-11-22 DIAGNOSIS — Z7901 Long term (current) use of anticoagulants: Secondary | ICD-10-CM | POA: Diagnosis not present

## 2019-11-22 DIAGNOSIS — Z803 Family history of malignant neoplasm of breast: Secondary | ICD-10-CM | POA: Diagnosis not present

## 2019-11-22 DIAGNOSIS — T451X5A Adverse effect of antineoplastic and immunosuppressive drugs, initial encounter: Secondary | ICD-10-CM | POA: Diagnosis not present

## 2019-11-22 DIAGNOSIS — R5383 Other fatigue: Secondary | ICD-10-CM | POA: Diagnosis not present

## 2019-11-22 DIAGNOSIS — Z86718 Personal history of other venous thrombosis and embolism: Secondary | ICD-10-CM | POA: Diagnosis not present

## 2019-11-22 MED ORDER — DEXAMETHASONE SODIUM PHOSPHATE 10 MG/ML IJ SOLN
10.0000 mg | Freq: Once | INTRAMUSCULAR | Status: AC
  Administered 2019-11-22: 10 mg via INTRAVENOUS

## 2019-11-22 MED ORDER — DIPHENHYDRAMINE HCL 25 MG PO CAPS
50.0000 mg | ORAL_CAPSULE | Freq: Once | ORAL | Status: AC
  Administered 2019-11-22: 50 mg via ORAL

## 2019-11-22 MED ORDER — ACETAMINOPHEN 325 MG PO TABS
ORAL_TABLET | ORAL | Status: AC
  Filled 2019-11-22: qty 2

## 2019-11-22 MED ORDER — PALONOSETRON HCL INJECTION 0.25 MG/5ML
0.2500 mg | Freq: Once | INTRAVENOUS | Status: AC
  Administered 2019-11-22: 0.25 mg via INTRAVENOUS

## 2019-11-22 MED ORDER — ACETAMINOPHEN 325 MG PO TABS
650.0000 mg | ORAL_TABLET | Freq: Once | ORAL | Status: AC
  Administered 2019-11-22: 650 mg via ORAL

## 2019-11-22 MED ORDER — SODIUM CHLORIDE 0.9 % IV SOLN
50.0000 mg/m2 | Freq: Once | INTRAVENOUS | Status: AC
  Administered 2019-11-22: 110 mg via INTRAVENOUS
  Filled 2019-11-22: qty 11

## 2019-11-22 MED ORDER — SODIUM CHLORIDE 0.9 % IV SOLN
Freq: Once | INTRAVENOUS | Status: AC
  Filled 2019-11-22: qty 250

## 2019-11-22 MED ORDER — DEXAMETHASONE SODIUM PHOSPHATE 10 MG/ML IJ SOLN
INTRAMUSCULAR | Status: AC
  Filled 2019-11-22: qty 1

## 2019-11-22 MED ORDER — TRASTUZUMAB-ANNS CHEMO 150 MG IV SOLR
6.0000 mg/kg | Freq: Once | INTRAVENOUS | Status: AC
  Administered 2019-11-22: 567 mg via INTRAVENOUS
  Filled 2019-11-22: qty 27

## 2019-11-22 MED ORDER — HEPARIN SOD (PORK) LOCK FLUSH 100 UNIT/ML IV SOLN
500.0000 [IU] | Freq: Once | INTRAVENOUS | Status: AC | PRN
  Administered 2019-11-22: 500 [IU]
  Filled 2019-11-22: qty 5

## 2019-11-22 MED ORDER — PALONOSETRON HCL INJECTION 0.25 MG/5ML
INTRAVENOUS | Status: AC
  Filled 2019-11-22: qty 5

## 2019-11-22 MED ORDER — SODIUM CHLORIDE 0.9% FLUSH
10.0000 mL | INTRAVENOUS | Status: DC | PRN
  Administered 2019-11-22: 10 mL
  Filled 2019-11-22: qty 10

## 2019-11-22 MED ORDER — DIPHENHYDRAMINE HCL 25 MG PO CAPS
ORAL_CAPSULE | ORAL | Status: AC
  Filled 2019-11-22: qty 2

## 2019-11-22 NOTE — Patient Instructions (Signed)
[facility] Discharge Instructions for Patients Receiving Chemotherapy  Today you received the following chemotherapy agents Kanjinti and Taxotere.  To help prevent nausea and vomiting after your treatment, we encourage you to take your nausea medication as directed BUT NO ZOFRAN FOR 3 DAYS AFTER CHEMO.   If you develop nausea and vomiting that is not controlled by your nausea medication, call the clinic.   BELOW ARE SYMPTOMS THAT SHOULD BE REPORTED IMMEDIATELY:  *FEVER GREATER THAN 100.5 F  *CHILLS WITH OR WITHOUT FEVER  NAUSEA AND VOMITING THAT IS NOT CONTROLLED WITH YOUR NAUSEA MEDICATION  *UNUSUAL SHORTNESS OF BREATH  *UNUSUAL BRUISING OR BLEEDING  TENDERNESS IN MOUTH AND THROAT WITH OR WITHOUT PRESENCE OF ULCERS  *URINARY PROBLEMS  *BOWEL PROBLEMS  UNUSUAL RASH Items with * indicate a potential emergency and should be followed up as soon as possible.  Feel free to call the clinic you have any questions or concerns. The clinic phone number is [phone].  Please show the [facility] at check-in to the Emergency Department and triage nurse.

## 2019-11-23 ENCOUNTER — Ambulatory Visit: Payer: Medicare Other | Attending: Internal Medicine

## 2019-11-23 DIAGNOSIS — Z23 Encounter for immunization: Secondary | ICD-10-CM | POA: Insufficient documentation

## 2019-11-23 NOTE — Progress Notes (Signed)
   Covid-19 Vaccination Clinic  Name:  [name] Foster    MRN: [id number] DOB: June 15, 1951  11/23/2019  Ms. [name] Foster was observed post Covid-19 immunization for 15 minutes without incidence. She was provided with Vaccine Information Sheet and instruction to access the V-Safe system.   Ms. [name] Foster was instructed to call 911 with any severe reactions post vaccine: Marland Kitchen Difficulty breathing  . Swelling of your face and throat  . A fast heartbeat  . A bad rash all over your body  . Dizziness and weakness    Immunizations Administered    Name Date Dose VIS Date Route   Pfizer COVID-19 Vaccine 11/23/2019 11:16 AM 0.3 mL 09/12/2019 Intramuscular   Manufacturer: [place]   Lot: [id number]   [place]: [address]-7

## 2019-11-25 ENCOUNTER — Ambulatory Visit
Admission: RE | Admit: 2019-11-25 | Discharge: 2019-11-25 | Disposition: A | Discharge status: Home / Self Care | Payer: Medicare Other | Source: Ambulatory Visit | Attending: Hematology and Oncology | Admitting: Hematology and Oncology

## 2019-11-25 DIAGNOSIS — N2889 Other specified disorders of kidney and ureter: Secondary | ICD-10-CM | POA: Diagnosis not present

## 2019-11-25 IMAGING — MR MR ABDOMEN WO/W CM
17 series · 48 of 48 positions shown · IV contrast (multihance)
Comparison: CT the abdomen and pelvis [DATE]. No prior
abdominal MRI.

CLINICAL DATA: 68-year-old female with history of mass in the upper
pole the left kidney and renal insufficiency.

EXAM:
MRI ABDOMEN WITHOUT AND WITH CONTRAST
TECHNIQUE: Multiplanar multisequence MR imaging of the abdomen was performed
both before and after the administration of intravenous contrast.
CONTRAST:  9mL MULTIHANCE GADOBENATE DIMEGLUMINE 529 MG/ML IV SOLN

[Series 3: T2 · coronal · 5.0mm · 1.56mm/px · 1 of 36 slices shown (1 of 3)]
[im 1/36]
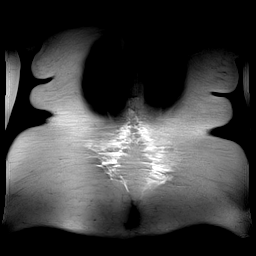

[Series 4: T2 · axial · 6.0mm · 1.22mm/px · 1 of 30 slices shown (2 of 3)]
[im 1/30]
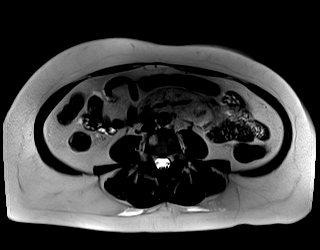

[Series 5: bSSFP · axial · 5.0mm · 1.25mm/px · 1 of 38 slices shown]
[im 1/38]
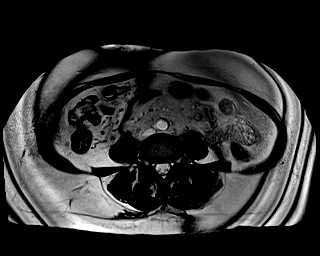

[Series 6: T1 · axial · 3.0mm · 1.19mm/px · z∈[-49,+164]mm · 6 of 144 slices shown]
[im 1/144]
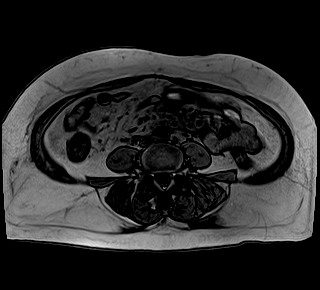
[im 29/144]
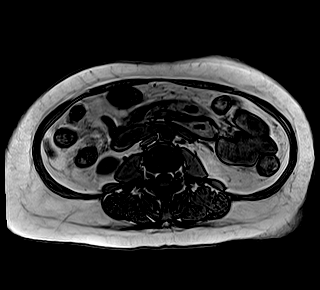
[im 58/144]
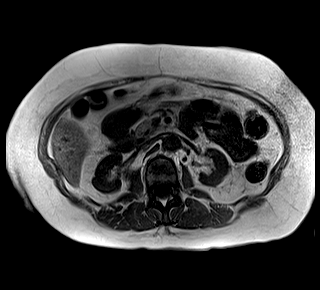
[im 86/144]
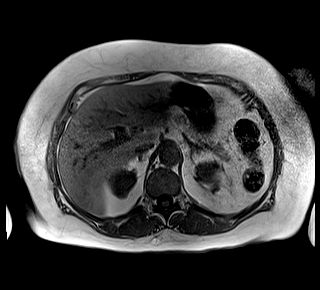
[im 115/144]
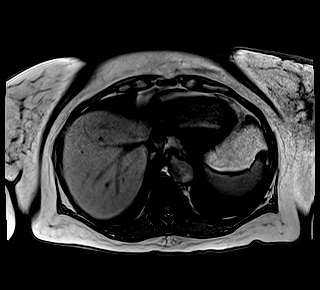
[im 144/144]
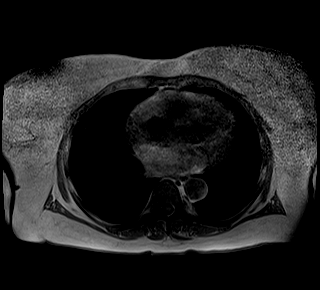

[Series 7: T2 · axial · 5.0mm · 1.48mm/px · z∈[-20,+202]mm · 2 of 38 slices shown (3 of 3)]
[im 1/38]
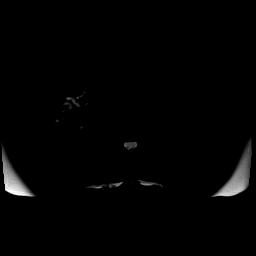
[im 38/38]
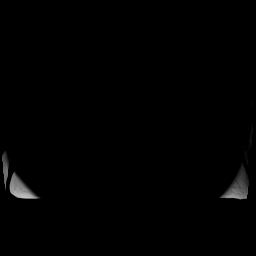

[Series 8: DWI · axial · 5.0mm · 1.42mm/px · z∈[-25,+197]mm · 5 of 114 slices shown (1 of 2)]
[im 1/114]
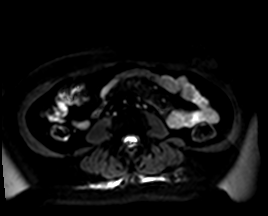
[im 29/114]
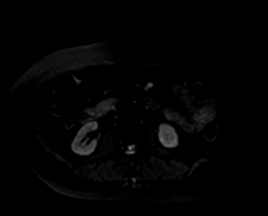
[im 57/114]
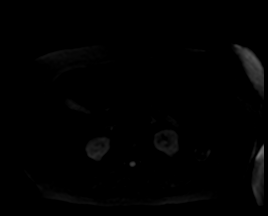
[im 85/114]
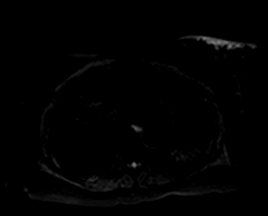
[im 114/114]
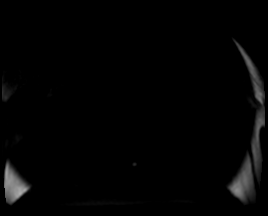

[Series 9: DWI · axial · 5.0mm · 1.42mm/px · z∈[-25,+197]mm · 2 of 38 slices shown (2 of 2)]
[im 1/38]
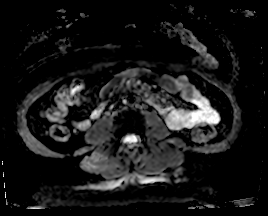
[im 38/38]
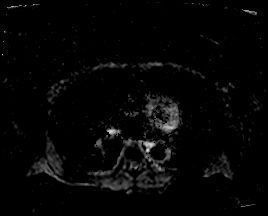

[Series 10: T1 dynamic · axial · non-contrast · 3.0mm · 1.25mm/px · z∈[-49,+164]mm · 3 of 72 slices shown]
[im 1/72]
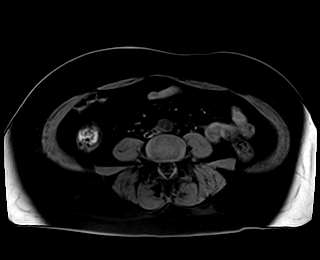
[im 36/72]
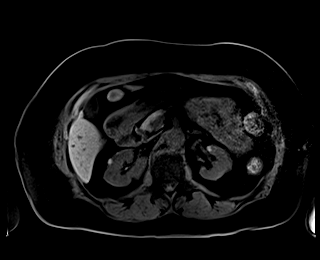
[im 72/72]
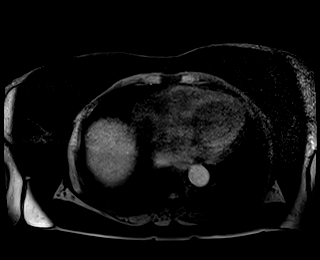

[Series 11: T1 dynamic post-contrast · axial · 3.0mm · 1.25mm/px · z∈[-49,+164]mm · 3 of 72 slices shown (1 of 9)]
[im 1/72]
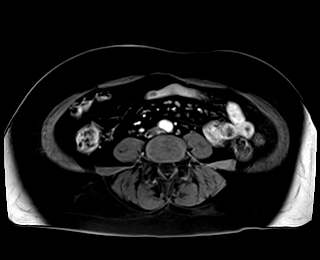
[im 36/72]
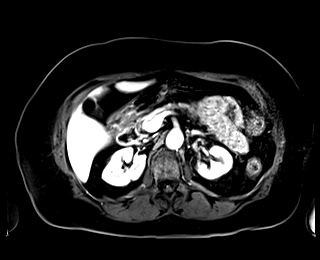
[im 72/72]
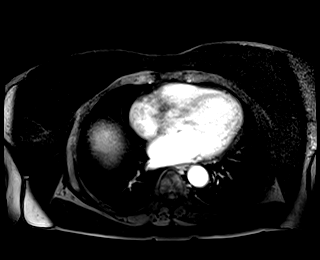

[Series 12: T1 dynamic post-contrast · axial · 3.0mm · 1.25mm/px · z∈[-49,+164]mm · 3 of 72 slices shown (2 of 9)]
[im 1/72]
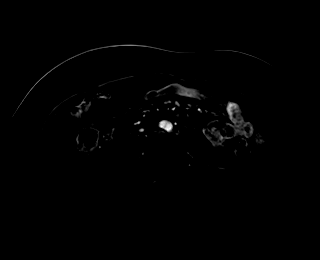
[im 36/72]
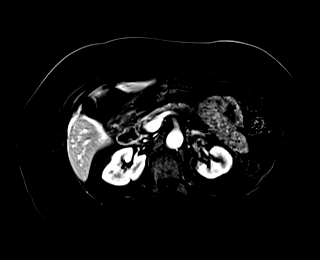
[im 72/72]
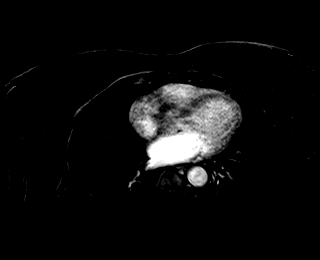

[Series 13: T1 dynamic post-contrast · axial · 3.0mm · 1.25mm/px · z∈[-49,+164]mm · 3 of 72 slices shown (3 of 9)]
[im 1/72]
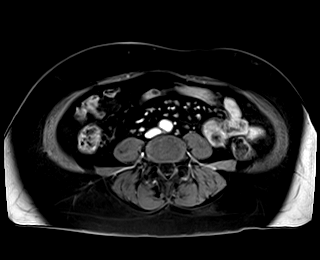
[im 36/72]
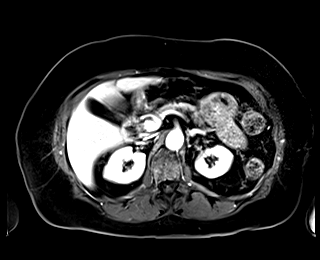
[im 72/72]
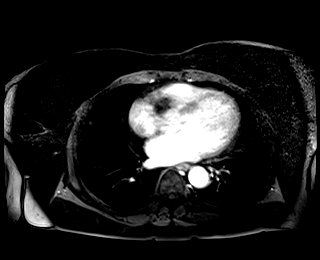

[Series 14: T1 dynamic post-contrast · axial · 3.0mm · 1.25mm/px · z∈[-49,+164]mm · 3 of 72 slices shown (4 of 9)]
[im 1/72]
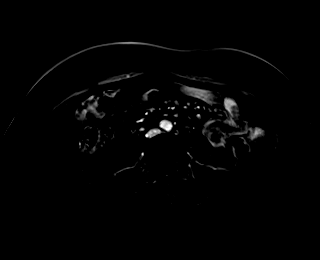
[im 36/72]
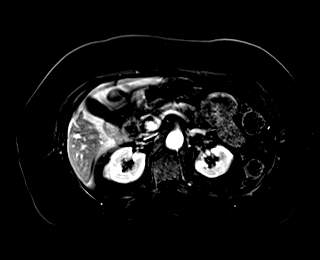
[im 72/72]
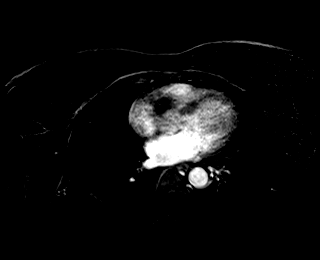

[Series 15: T1 dynamic post-contrast · axial · 3.0mm · 1.25mm/px · z∈[-49,+164]mm · 3 of 72 slices shown (5 of 9)]
[im 1/72]
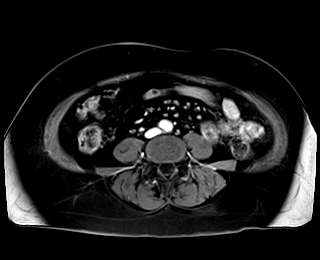
[im 36/72]
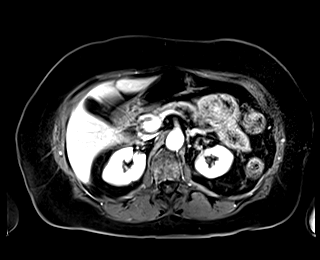
[im 72/72]
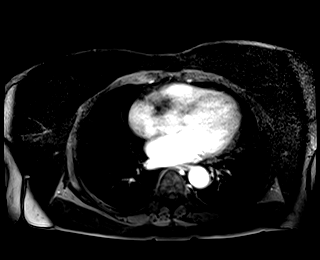

[Series 16: T1 dynamic post-contrast · axial · 3.0mm · 1.25mm/px · z∈[-49,+164]mm · 3 of 72 slices shown (6 of 9)]
[im 1/72]
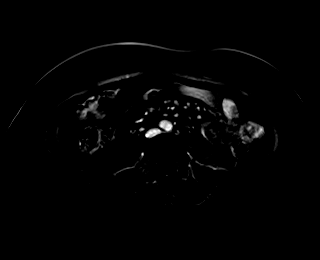
[im 36/72]
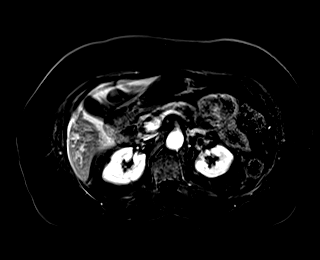
[im 72/72]
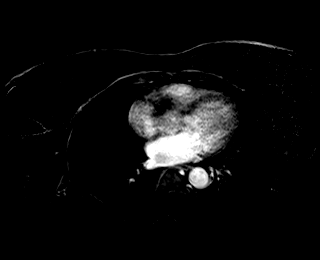

[Series 17: T1 dynamic post-contrast · coronal · 3.0mm · 1.25mm/px · 3 of 72 slices shown (7 of 9)]
[im 1/72]
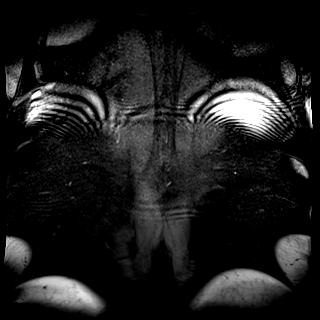
[im 36/72]
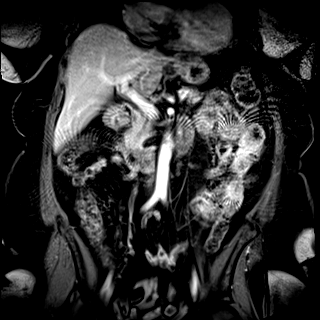
[im 72/72]
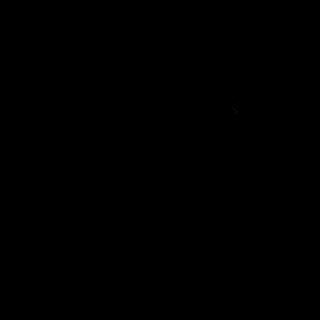

[Series 18: T1 dynamic post-contrast · axial · 3.0mm · 1.25mm/px · z∈[-49,+164]mm · 3 of 72 slices shown (8 of 9)]
[im 1/72]
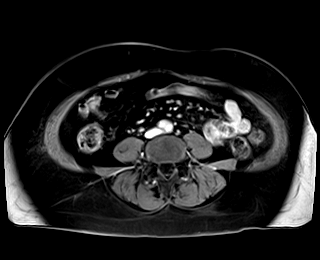
[im 36/72]
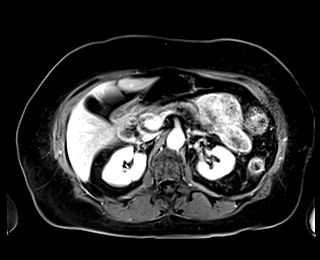
[im 72/72]
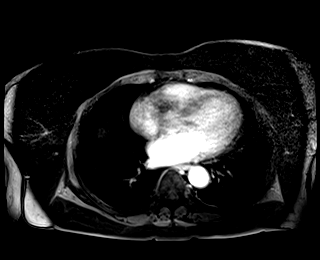

[Series 19: T1 dynamic post-contrast · axial · 3.0mm · 1.25mm/px · z∈[-49,+164]mm · 3 of 72 slices shown (9 of 9)]
[im 1/72]
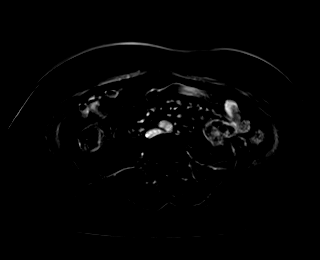
[im 36/72]
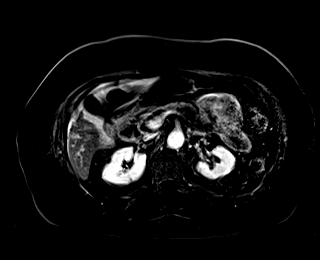
[im 72/72]
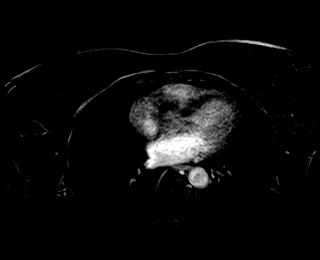

[48 of 48 positions shown; findings below may reference images not displayed]

FINDINGS: Lower chest: Unremarkable.

Hepatobiliary: No suspicious cystic or solid hepatic lesions. No
intra or extrahepatic biliary ductal dilatation. Gallbladder is
normal in appearance.

Pancreas: No pancreatic mass. No pancreatic ductal dilatation. No
pancreatic or peripancreatic fluid collections or inflammatory
changes.

Spleen:  Unremarkable.

Adrenals/Urinary Tract: There are multiple small renal lesions
bilaterally, some of which are T1 hypointense, T2 hyperintense and
do not enhance, compatible with simple cysts (largest of which
measure only 1.3 cm in the upper pole the right kidney). Other
lesions are T1 hyperintense, variable T2 signal intensity, and do
not enhance, compatible with a small proteinaceous/hemorrhagic
cysts, largest of which measures 6 mm in the upper pole of the right
kidney. In addition, in the upper pole the left kidney there is an
exophytic lesion (axial image 30 of series 18) measuring 1.3 cm in
diameter, which is heterogeneous in T1 and T2 signal intensity, but
demonstrates extensive enhancement on post gadolinium images,
compatible with a small neoplasm. This is contained within Gerota's
fascia and is well separated from the left renal vein which is
widely patent. No hydroureteronephrosis in the visualized portions
of the abdomen. Bilateral adrenal glands are normal in appearance.

Stomach/Bowel: Visualized portions are unremarkable.

Vascular/Lymphatic: No aneurysm identified in the visualized
abdominal vasculature. No lymphadenopathy noted in the abdomen.

Other: No significant volume of ascites noted in the visualized
portions of the peritoneal cavity.

Musculoskeletal: No aggressive appearing osseous lesions are noted
in the visualized portions of the skeleton.
IMPRESSION: 1. 1.3 cm exophytic enhancing lesion in the upper pole the left
kidney, compatible with a small renal neoplasm. No left renal vein
involvement, direct invasion of adjacent structures, or definite
signs of metastatic disease in the abdomen.
2. Multiple small Bosniak class 1 and Bosniak class 2 cysts in the
kidneys, as above.

## 2019-11-25 MED ORDER — GADOBENATE DIMEGLUMINE 529 MG/ML IV SOLN
9.0000 mL | Freq: Once | INTRAVENOUS | Status: AC | PRN
  Administered 2019-11-25: 9 mL via INTRAVENOUS

## 2019-11-27 ENCOUNTER — Encounter: Payer: Self-pay | Admitting: *Deleted

## 2019-11-28 ENCOUNTER — Telehealth: Payer: Self-pay | Admitting: *Deleted

## 2019-11-28 ENCOUNTER — Inpatient Hospital Stay: Payer: Medicare Other

## 2019-11-28 ENCOUNTER — Other Ambulatory Visit: Payer: Self-pay | Admitting: Emergency Medicine

## 2019-11-28 ENCOUNTER — Other Ambulatory Visit: Payer: Self-pay

## 2019-11-28 ENCOUNTER — Other Ambulatory Visit: Payer: Medicare Other

## 2019-11-28 ENCOUNTER — Inpatient Hospital Stay (HOSPITAL_BASED_OUTPATIENT_CLINIC_OR_DEPARTMENT_OTHER): Payer: Medicare Other | Admitting: Medical

## 2019-11-28 VITALS — BP 116/60 | HR 70 | Temp 98.2°F | Resp 17 | Ht 66.0 in

## 2019-11-28 DIAGNOSIS — C50812 Malignant neoplasm of overlapping sites of left female breast: Secondary | ICD-10-CM

## 2019-11-28 DIAGNOSIS — Z79899 Other long term (current) drug therapy: Secondary | ICD-10-CM | POA: Diagnosis not present

## 2019-11-28 DIAGNOSIS — Z171 Estrogen receptor negative status [ER-]: Secondary | ICD-10-CM | POA: Diagnosis not present

## 2019-11-28 DIAGNOSIS — R439 Unspecified disturbances of smell and taste: Secondary | ICD-10-CM | POA: Diagnosis not present

## 2019-11-28 DIAGNOSIS — R040 Epistaxis: Secondary | ICD-10-CM

## 2019-11-28 DIAGNOSIS — Z86718 Personal history of other venous thrombosis and embolism: Secondary | ICD-10-CM

## 2019-11-28 DIAGNOSIS — Z95828 Presence of other vascular implants and grafts: Secondary | ICD-10-CM | POA: Diagnosis not present

## 2019-11-28 DIAGNOSIS — Z800 Family history of malignant neoplasm of digestive organs: Secondary | ICD-10-CM | POA: Diagnosis not present

## 2019-11-28 DIAGNOSIS — Z7901 Long term (current) use of anticoagulants: Secondary | ICD-10-CM | POA: Diagnosis not present

## 2019-11-28 DIAGNOSIS — Z87891 Personal history of nicotine dependence: Secondary | ICD-10-CM | POA: Diagnosis not present

## 2019-11-28 DIAGNOSIS — Z9012 Acquired absence of left breast and nipple: Secondary | ICD-10-CM | POA: Diagnosis not present

## 2019-11-28 DIAGNOSIS — R197 Diarrhea, unspecified: Secondary | ICD-10-CM | POA: Diagnosis not present

## 2019-11-28 DIAGNOSIS — Z808 Family history of malignant neoplasm of other organs or systems: Secondary | ICD-10-CM | POA: Diagnosis not present

## 2019-11-28 DIAGNOSIS — T451X5A Adverse effect of antineoplastic and immunosuppressive drugs, initial encounter: Secondary | ICD-10-CM | POA: Diagnosis not present

## 2019-11-28 DIAGNOSIS — D6481 Anemia due to antineoplastic chemotherapy: Secondary | ICD-10-CM | POA: Diagnosis not present

## 2019-11-28 DIAGNOSIS — R5383 Other fatigue: Secondary | ICD-10-CM | POA: Diagnosis not present

## 2019-11-28 DIAGNOSIS — Z5111 Encounter for antineoplastic chemotherapy: Secondary | ICD-10-CM | POA: Diagnosis not present

## 2019-11-28 DIAGNOSIS — E860 Dehydration: Secondary | ICD-10-CM | POA: Diagnosis not present

## 2019-11-28 DIAGNOSIS — Z803 Family history of malignant neoplasm of breast: Secondary | ICD-10-CM | POA: Diagnosis not present

## 2019-11-28 DIAGNOSIS — N189 Chronic kidney disease, unspecified: Secondary | ICD-10-CM | POA: Diagnosis not present

## 2019-11-28 DIAGNOSIS — Z5112 Encounter for antineoplastic immunotherapy: Secondary | ICD-10-CM | POA: Diagnosis not present

## 2019-11-28 LAB — CBC WITH DIFFERENTIAL (CANCER CENTER ONLY)
Abs Immature Granulocytes: 0.03 10*3/uL (ref 0.00–0.07)
Basophils Absolute: 0 10*3/uL (ref 0.0–0.1)
Basophils Relative: 0 %
Eosinophils Absolute: 0 10*3/uL (ref 0.0–0.5)
Eosinophils Relative: 1 %
HCT: 27.1 % — ABNORMAL LOW (ref 36.0–46.0)
Hemoglobin: 8.4 g/dL — ABNORMAL LOW (ref 12.0–15.0)
Immature Granulocytes: 1 %
Lymphocytes Relative: 34 %
Lymphs Abs: 1.6 10*3/uL (ref 0.7–4.0)
MCH: 29.2 pg (ref 26.0–34.0)
MCHC: 31 g/dL (ref 30.0–36.0)
MCV: 94.1 fL (ref 80.0–100.0)
Monocytes Absolute: 0.1 10*3/uL (ref 0.1–1.0)
Monocytes Relative: 2 %
Neutro Abs: 3 10*3/uL (ref 1.7–7.7)
Neutrophils Relative %: 62 %
Platelet Count: 203 10*3/uL (ref 150–400)
RBC: 2.88 MIL/uL — ABNORMAL LOW (ref 3.87–5.11)
RDW: 14.6 % (ref 11.5–15.5)
WBC Count: 4.7 10*3/uL (ref 4.0–10.5)
nRBC: 0 % (ref 0.0–0.2)

## 2019-11-28 LAB — CMP (CANCER CENTER ONLY)
ALT: 10 U/L (ref 0–44)
AST: 12 U/L — ABNORMAL LOW (ref 15–41)
Albumin: 3.3 g/dL — ABNORMAL LOW (ref 3.5–5.0)
Alkaline Phosphatase: 107 U/L (ref 38–126)
Anion gap: 10 (ref 5–15)
BUN: 17 mg/dL (ref 8–23)
CO2: 27 mmol/L (ref 22–32)
Calcium: 8.8 mg/dL — ABNORMAL LOW (ref 8.9–10.3)
Chloride: 108 mmol/L (ref 98–111)
Creatinine: 1.55 mg/dL — ABNORMAL HIGH (ref 0.44–1.00)
GFR, Est AFR Am: 40 mL/min — ABNORMAL LOW (ref >60)
GFR, Estimated: 34 mL/min — ABNORMAL LOW (ref >60)
Glucose, Bld: 101 mg/dL — ABNORMAL HIGH (ref 70–99)
Potassium: 3.8 mmol/L (ref 3.5–5.1)
Sodium: 145 mmol/L (ref 135–145)
Total Bilirubin: 0.4 mg/dL (ref 0.3–1.2)
Total Protein: 6.5 g/dL (ref 6.5–8.1)

## 2019-11-28 LAB — PROTIME-INR
INR: 4.6 (ref 0.8–1.2)
Prothrombin Time: 43.5 seconds — ABNORMAL HIGH (ref 11.4–15.2)

## 2019-11-28 MED ORDER — HEPARIN SOD (PORK) LOCK FLUSH 100 UNIT/ML IV SOLN
500.0000 [IU] | Freq: Once | INTRAVENOUS | Status: AC | PRN
  Administered 2019-11-28: 500 [IU]
  Filled 2019-11-28: qty 5

## 2019-11-28 MED ORDER — SODIUM CHLORIDE 0.9% FLUSH
10.0000 mL | INTRAVENOUS | Status: DC | PRN
  Administered 2019-11-28: 12:00:00 10 mL
  Filled 2019-11-28: qty 10

## 2019-11-28 NOTE — Telephone Encounter (Signed)
Received call from pt stating she has experienced 5 nose bleeds since 2am. Pt currently on kankinti and taxotere as well as warfarin.  Per MD pt to be seen in [facility] this morning.  Learta Codding, RN notified.  Scheduling message sent and pt notified and verbalized understanding of arrival time to the clinic today.

## 2019-11-28 NOTE — Progress Notes (Signed)
Critical lab value received 1120: PT-INR 4.6. PA Lucianne Lei aware.

## 2019-11-28 NOTE — Progress Notes (Unsigned)
Called to [employer] at 1120am by [facility] 11/28/19

## 2019-11-28 NOTE — Patient Instructions (Signed)
Nosebleed, Adult A nosebleed is when blood comes out of the nose. Nosebleeds are common. Usually, they are not a sign of a serious condition. Nosebleeds can happen if a small blood vessel in your nose starts to bleed or if the lining of your nose (mucous membrane) cracks. They are commonly caused by:  Allergies.  Colds.  Picking your nose.  Blowing your nose too hard.  An injury from sticking an object into your nose or getting hit in the nose.  Dry or cold air. Less common causes of nosebleeds include:  Toxic fumes.  Something abnormal in the nose or in the air-filled spaces in the bones of the face (sinuses).  Growths in the nose, such as polyps.  Medicines or conditions that cause blood to clot slowly.  Certain illnesses or procedures that irritate or dry out the nasal passages. Follow these instructions at home: When you have a nosebleed:   Sit down and tilt your head slightly forward.  Use a clean towel or tissue to pinch your nostrils under the bony part of your nose. After 10 minutes, let go of your nose and see if bleeding starts again. Do not release pressure before that time. If there is still bleeding, repeat the pinching and holding for 10 minutes until the bleeding stops.  Do not place tissues or gauze in the nose to stop bleeding.  Avoid lying down and avoid tilting your head backward. That may make blood collect in the throat and cause gagging or coughing.  Use a nasal spray decongestant to help with a nosebleed as told by your health care provider.  Do not use petroleum jelly or mineral oil in your nose. It can drip into your lungs. After a nosebleed:  Avoid blowing your nose or sniffing for a number of hours.  Avoid straining, lifting, or bending at the waist for several days. You may resume other normal activities as you are able.  Use saline spray or a humidifier as told by your health care provider.  Aspirinand blood thinners make bleeding more  likely. If you are prescribed these medicines and you suffer from nosebleeds: ? Ask your health care provider if you should stop taking the medicines or if you should adjust the dose. ? Do not stop taking medicines that your health care provider has recommended unless told by your health care provider.  If your nosebleed was caused by dry mucous membranes, use over-the-counter saline nasal spray or gel. This will keep the mucous membranes moist and allow them to heal. If you must use a lubricant: ? Choose one that is water-soluble. ? Use only as much as you need and use it only as often as needed. ? Do not lie down until several hours after you use it. Contact a health care provider if:  You have a fever.  You get nosebleeds often or more often than usual.  You bruise very easily.  You have a nosebleed from having something stuck in your nose.  You have bleeding in your mouth.  You vomit or cough up brown material.  You have a nosebleed after you start a new medicine. Get help right away if:  You have a nosebleed after a fall or a head injury.  Your nosebleed does not go away after 20 minutes.  You feel dizzy or weak.  You have unusual bleeding from other parts of your body.  You have unusual bruising on other parts of your body.  You become sweaty.  You   vomit blood. This information is not intended to replace advice given to you by your health care provider. Make sure you discuss any questions you have with your health care provider. Document Revised: 12/18/2017 Document Reviewed: 04/04/2016 Elsevier Patient Education  2020 [employer]  

## 2019-11-28 NOTE — Progress Notes (Signed)
[facility] Progress Note   [name] Foster [id number] 1951/04/22 69 y.o.  [name] Foster is managed by Dr. Nicholas Foster  Actively treated with chemotherapy/immunotherapy/hormonal therapy: yes  Current therapy: docetaxel and trastuzumab-anns   Last treated: 11/22/2019 (cycle 4, day 1)  Next scheduled appointment with provider: 12/12/2019  Assessment: Plan:    Epistaxis - Plan: CBC with Differential ([place] Only), Protime-INR  Port-A-Cath in place - Plan: heparin lock flush 100 unit/mL, sodium chloride flush (NS) 0.9 % injection 10 mL  DVT, HX OF - Plan: CBC with Differential ([name] Foster Only), Protime-INR  Malignant neoplasm of overlapping sites of left breast in female, estrogen receptor negative ([place])   Epitaxis with a history of a DVT: A CBC returned with a hemoglobin of 8.4 and a hematocrit of 27.1. A PT returned at 43.5 and an INR of 4.6. She will hold coumadin tonight and tomorrow. She will change her dosing to 5 mg on Monday through Friday and 2.5 mg on Saturday and Sunday beginning on 11/30/2019.  She will return to clinic in 1 week for a repeat PT/INR.  ER negative malignant neoplasm of the left breast.  The patient continues to be managed by Dr. Nicholas Foster and is status post cycle 4, day 1 of docetaxel and trastuzumab-anns which was dosed on 11/22/2019.  She is scheduled to be seen in follow-up on 12/12/2019.  Please see After Visit Summary for patient specific instructions.  Future Appointments  Date Time Provider [place]  12/12/2019  8:30 AM [facility] LAB 2 [facility] None  12/12/2019  8:45 AM [facility] FLUSH [facility] None  12/12/2019  9:45 AM [facility] INFUSION [facility] None  12/17/2019  4:00 PM [facility] [name] Foster [facility] PEC  01/02/2020  8:30 AM [facility] LAB 3 [facility] None  01/02/2020  8:45 AM [facility] [place] FLUSH [facility] None  01/02/2020  9:45 AM [facility] INFUSION [facility] None    Orders Placed This  Encounter  Procedures  . CBC with Differential ([place] Only)  . Protime-INR       Subjective:   Patient ID:  [name] Foster is a 69 y.o. (DOB 05/16/1951) female.  Chief Complaint:  Chief Complaint  Patient presents with  . Epistaxis    HPI Judianne Foster Is a 69 y.o. female with a diagnosis of an ER negative malignant neoplasm of the left breast.  She is managed by Dr. Nicholas Foster and is status post cycle 4, day 1 of docetaxel and trastuzumab-anns which was dosed on 11/22/2019.  She has a history of a DVT and is currently on Coumadin 5 mg once daily.  She presents to the office today with multiple episodes of epic taxis this morning.  She denies bright red blood per rectum, vaginal bleeding, or hematuria.  She presents to the clinic today with her son.  Medications: I have reviewed the patient's current medications.  Allergies:  Allergies  Allergen Reactions  . Penicillins Rash    Did it involve swelling of the face/tongue/throat, SOB, or low BP? Unknown Did it involve sudden or severe rash/hives, skin peeling, or any reaction on the inside of your mouth or nose? Yes Did you need to seek medical attention at a hospital or doctor's office? Yes When did it last happen?in her 74s If all above answers are "NO", may proceed with cephalosporin use.   . Tetanus Toxoid Rash    Caused cellulitis   . Aspirin Rash    Past Medical History:  Diagnosis Date  . Allergic rhinitis   .  Aortic valve disorders   . Arthritis   . Asthma   . CHF (congestive heart failure) ([place])   . Coronary artery disease   . Family history of adverse reaction to anesthesia    difficulty waking mother up after surgery  . Family history of breast cancer   . Family history of cervical cancer   . Family history of colon cancer   . Family history of throat cancer   . Heart murmur   . Hyperlipidemia   . Hypertension   . Long term (current) use of anticoagulants   . Other primary  cardiomyopathies   . Personal history of colonic polyps   . Personal history of venous thrombosis and embolism     Past Surgical History:  Procedure Laterality Date  . ABDOMINAL HYSTERECTOMY    . CARDIAC CATHETERIZATION    . COLON SURGERY     colonoscopy  . DILATION AND CURETTAGE OF UTERUS     several  . PORTACATH PLACEMENT Left 08/18/2019   Procedure: INSERTION PORT-A-CATH;  Surgeon: [name] Klein, MD;  Location: [facility];  Service: General;  Laterality: Left;  . Rt knee arthoscopic    . TEE WITHOUT CARDIOVERSION N/A 02/21/2013   Procedure: TRANSESOPHAGEAL ECHOCARDIOGRAM (TEE);  Surgeon: [name] Perla, MD;  Location[facility] ENDOSCOPY;  Service: Cardiovascular;  Laterality: N/A;    Family History  Problem Relation Age of Onset  . Alcohol abuse Other   . Depression Other   . Hyperlipidemia Other   . Hypertension Other   . Kidney disease Other   . Colon cancer Mother        dx late 59s  . Cervical cancer Maternal Grandmother   . Stroke Paternal Grandmother   . Breast cancer Maternal Aunt        dx 51s  . Breast cancer Cousin        dx 64s    Social History   Socioeconomic History  . Marital status: Legally Separated    Spouse name: Not on file  . Number of children: Not on file  . Years of education: Not on file  . Highest education level: Not on file  Occupational History  . Not on file  Tobacco Use  . Smoking status: Former Smoker    Packs/day: 0.25    Quit date: 07/02/2019    Years since quitting: 0.4  . Smokeless tobacco: Never Used  Substance and Sexual Activity  . Alcohol use: Yes    Comment: rare occasion  . Drug use: No  . Sexual activity: Not on file  Other Topics Concern  . 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    Divorced   Regular exercise- no   5 hours sleep    Lives with a son age 68    No pets         Social Determinants of Health   Financial Resource Strain:   . Difficulty of Paying Living  Expenses: Not on file  Food Insecurity:   . Worried About Charity fundraiser in the Last Year: Not on file  . Ran Out of Food in the Last Year: Not on file  Transportation Needs:   . Lack of Transportation (Medical): Not on file  . Lack of Transportation (Non-Medical): Not on file  Physical Activity:   . Days of Exercise per Week: Not on file  . Minutes of Exercise per Session: Not on file  Stress:   .  Feeling of Stress : Not on file  Social Connections:   . Frequency of Communication with Friends and Family: Not on file  . Frequency of Social Gatherings with Friends and Family: Not on file  . Attends Religious Services: Not on file  . Active Member of Clubs or Organizations: Not on file  . Attends Archivist Meetings: Not on file  . Marital Status: Not on file  Intimate Partner Violence:   . Fear of Current or Ex-Partner: Not on file  . Emotionally Abused: Not on file  . Physically Abused: Not on file  . Sexually Abused: Not on file    Past Medical History, Surgical history, Social history, and Family history were reviewed and updated as appropriate.   Please see review of systems for further details on the patient's review from today.   Review of Systems:  Review of Systems  Constitutional: Negative for chills, diaphoresis and fever.  HENT: Positive for nosebleeds. Negative for trouble swallowing and voice change.   Respiratory: Negative for cough, chest tightness, shortness of breath and wheezing.   Cardiovascular: Negative for chest pain and palpitations.  Gastrointestinal: Negative for abdominal pain, anal bleeding, blood in stool, constipation, diarrhea, nausea and vomiting.  Genitourinary: Negative for hematuria and vaginal bleeding.  Musculoskeletal: Negative for back pain and myalgias.  Neurological: Negative for dizziness, light-headedness and headaches.    Objective:   Physical Exam:  BP 116/60 (BP Location: Left Arm, Patient Position: Sitting)    Pulse 70   Temp 98.2 F (36.8 C) (Temporal)   Resp 17   Ht 5\' 6"  (1.676 m)   SpO2 100%   BMI 31.22 kg/m  ECOG: 0  Physical Exam Constitutional:      General: She is not in acute distress.    Appearance: Normal appearance. She is not ill-appearing, toxic-appearing or diaphoretic.  HENT:     Head: Normocephalic and atraumatic.  Eyes:     General: No scleral icterus.       Right eye: No discharge.        Left eye: No discharge.  Neurological:     Mental Status: She is alert.     Coordination: Coordination normal.     Gait: Gait normal.  Psychiatric:        Mood and Affect: Mood normal.        Behavior: Behavior normal.        Thought Content: Thought content normal.        Judgment: Judgment normal.     Lab Review:     Component Value Date/Time   NA 145 11/28/2019 1040   K 3.8 11/28/2019 1040   CL 108 11/28/2019 1040   CO2 27 11/28/2019 1040   GLUCOSE 101 (H) 11/28/2019 1040   BUN 17 11/28/2019 1040   CREATININE 1.55 (H) 11/28/2019 1040   CREATININE 1.03 (H) 07/26/2016 1210   CALCIUM 8.8 (L) 11/28/2019 1040   PROT 6.5 11/28/2019 1040   ALBUMIN 3.3 (L) 11/28/2019 1040   AST 12 (L) 11/28/2019 1040   ALT 10 11/28/2019 1040   ALKPHOS 107 11/28/2019 1040   BILITOT 0.4 11/28/2019 1040   GFRNONAA 34 (L) 11/28/2019 1040   GFRNONAA 51 (L) 08/10/2014 1634   GFRAA 40 (L) 11/28/2019 1040   GFRAA 58 (L) 08/10/2014 1634       Component Value Date/Time   WBC 4.7 11/28/2019 1040   WBC 6.7 10/03/2019 0407   RBC 2.88 (L) 11/28/2019 1040   HGB 8.4 (L) 11/28/2019  1040   HCT 27.1 (L) 11/28/2019 1040   PLT 203 11/28/2019 1040   MCV 94.1 11/28/2019 1040   MCH 29.2 11/28/2019 1040   MCHC 31.0 11/28/2019 1040   RDW 14.6 11/28/2019 1040   LYMPHSABS 1.6 11/28/2019 1040   MONOABS 0.1 11/28/2019 1040   EOSABS 0.0 11/28/2019 1040   BASOSABS 0.0 11/28/2019 1040   -------------------------------  Imaging from last 24 hours (if applicable):  Radiology interpretation: MR  Abdomen W Wo Contrast  Result Date: 11/26/2019 CLINICAL DATA:  69 year old female with history of mass in the upper pole the left kidney and renal insufficiency. EXAM: MRI ABDOMEN WITHOUT AND WITH CONTRAST TECHNIQUE: Multiplanar multisequence MR imaging of the abdomen was performed both before and after the administration of intravenous contrast. CONTRAST:  30mL MULTIHANCE GADOBENATE DIMEGLUMINE 529 MG/ML IV SOLN COMPARISON:  CT the abdomen and pelvis 09/21/2019. No prior abdominal MRI. FINDINGS: Lower chest: Unremarkable. Hepatobiliary: No suspicious cystic or solid hepatic lesions. No intra or extrahepatic biliary ductal dilatation. Gallbladder is normal in appearance. Pancreas: No pancreatic mass. No pancreatic ductal dilatation. No pancreatic or peripancreatic fluid collections or inflammatory changes. Spleen:  Unremarkable. Adrenals/Urinary Tract: There are multiple small renal lesions bilaterally, some of which are T1 hypointense, T2 hyperintense and do not enhance, compatible with simple cysts (largest of which measure only 1.3 cm in the upper pole the right kidney). Other lesions are T1 hyperintense, variable T2 signal intensity, and do not enhance, compatible with a small proteinaceous/hemorrhagic cysts, largest of which measures 6 mm in the upper pole of the right kidney. In addition, in the upper pole the left kidney there is an exophytic lesion (axial image 30 of series 18) measuring 1.3 cm in diameter, which is heterogeneous in T1 and T2 signal intensity, but demonstrates extensive enhancement on post gadolinium images, compatible with a small neoplasm. This is contained within Gerota's fascia and is well separated from the left renal vein which is widely patent. No hydroureteronephrosis in the visualized portions of the abdomen. Bilateral adrenal glands are normal in appearance. Stomach/Bowel: Visualized portions are unremarkable. Vascular/Lymphatic: No aneurysm identified in the visualized abdominal  vasculature. No lymphadenopathy noted in the abdomen. Other: No significant volume of ascites noted in the visualized portions of the peritoneal cavity. Musculoskeletal: No aggressive appearing osseous lesions are noted in the visualized portions of the skeleton. IMPRESSION: 1. 1.3 cm exophytic enhancing lesion in the upper pole the left kidney, compatible with a small renal neoplasm. No left renal vein involvement, direct invasion of adjacent structures, or definite signs of metastatic disease in the abdomen. 2. Multiple small Bosniak class 1 and Bosniak class 2 cysts in the kidneys, as above. Electronically Signed   By: Vinnie Langton M.D.   On: 11/26/2019 10:20        This case was discussed with Dr. Lindi Adie. He expresses agreement with my management of this patient.

## 2019-12-01 ENCOUNTER — Encounter (HOSPITAL_COMMUNITY): Payer: Self-pay | Admitting: Obstetrics and Gynecology

## 2019-12-01 ENCOUNTER — Other Ambulatory Visit: Payer: Self-pay

## 2019-12-01 ENCOUNTER — Telehealth: Payer: Self-pay | Admitting: *Deleted

## 2019-12-01 ENCOUNTER — Emergency Department (HOSPITAL_COMMUNITY)
Admission: EM | Admit: 2019-12-01 | Discharge: 2019-12-01 | Disposition: A | Discharge status: Home / Self Care | Payer: Medicare Other | Attending: Emergency Medicine | Admitting: Emergency Medicine

## 2019-12-01 DIAGNOSIS — Z170 Estrogen receptor positive status [ER+]: Secondary | ICD-10-CM | POA: Diagnosis not present

## 2019-12-01 DIAGNOSIS — C50919 Malignant neoplasm of unspecified site of unspecified female breast: Secondary | ICD-10-CM | POA: Insufficient documentation

## 2019-12-01 DIAGNOSIS — I509 Heart failure, unspecified: Secondary | ICD-10-CM | POA: Insufficient documentation

## 2019-12-01 DIAGNOSIS — I2510 Atherosclerotic heart disease of native coronary artery without angina pectoris: Secondary | ICD-10-CM | POA: Diagnosis not present

## 2019-12-01 DIAGNOSIS — Z79899 Other long term (current) drug therapy: Secondary | ICD-10-CM | POA: Insufficient documentation

## 2019-12-01 DIAGNOSIS — Z87891 Personal history of nicotine dependence: Secondary | ICD-10-CM | POA: Diagnosis not present

## 2019-12-01 DIAGNOSIS — J45909 Unspecified asthma, uncomplicated: Secondary | ICD-10-CM | POA: Diagnosis not present

## 2019-12-01 DIAGNOSIS — R040 Epistaxis: Secondary | ICD-10-CM | POA: Diagnosis not present

## 2019-12-01 DIAGNOSIS — I110 Hypertensive heart disease with heart failure: Secondary | ICD-10-CM | POA: Diagnosis not present

## 2019-12-01 HISTORY — DX: Malignant (primary) neoplasm, unspecified: C80.1

## 2019-12-01 LAB — CBC WITH DIFFERENTIAL/PLATELET
Abs Immature Granulocytes: 0.04 10*3/uL (ref 0.00–0.07)
Basophils Absolute: 0 10*3/uL (ref 0.0–0.1)
Basophils Relative: 0 %
Eosinophils Absolute: 0 10*3/uL (ref 0.0–0.5)
Eosinophils Relative: 1 %
HCT: 25.2 % — ABNORMAL LOW (ref 36.0–46.0)
Hemoglobin: 7.8 g/dL — ABNORMAL LOW (ref 12.0–15.0)
Immature Granulocytes: 1 %
Lymphocytes Relative: 47 %
Lymphs Abs: 2.1 10*3/uL (ref 0.7–4.0)
MCH: 30 pg (ref 26.0–34.0)
MCHC: 31 g/dL (ref 30.0–36.0)
MCV: 96.9 fL (ref 80.0–100.0)
Monocytes Absolute: 0.3 10*3/uL (ref 0.1–1.0)
Monocytes Relative: 7 %
Neutro Abs: 2 10*3/uL (ref 1.7–7.7)
Neutrophils Relative %: 44 %
Platelets: 217 10*3/uL (ref 150–400)
RBC: 2.6 MIL/uL — ABNORMAL LOW (ref 3.87–5.11)
RDW: 14.8 % (ref 11.5–15.5)
WBC: 4.5 10*3/uL (ref 4.0–10.5)
nRBC: 0 % (ref 0.0–0.2)

## 2019-12-01 LAB — PROTIME-INR
INR: 1.2 (ref 0.8–1.2)
Prothrombin Time: 14.9 seconds (ref 11.4–15.2)

## 2019-12-01 MED ORDER — OXYMETAZOLINE HCL 0.05 % NA SOLN
1.0000 | Freq: Once | NASAL | Status: AC
  Administered 2019-12-01: 1 via NASAL
  Filled 2019-12-01: qty 30

## 2019-12-01 MED ORDER — HEPARIN SOD (PORK) LOCK FLUSH 100 UNIT/ML IV SOLN
500.0000 [IU] | Freq: Once | INTRAVENOUS | Status: AC
  Administered 2019-12-01: 500 [IU]
  Filled 2019-12-01: qty 5

## 2019-12-01 NOTE — ED Notes (Signed)
RN gave patient a gingerale at MD request. Patient sipping on it now.

## 2019-12-01 NOTE — ED Notes (Signed)
Patient reports using Afrin nasal spray x2 days without relief. Patient also reports seeing multiple clots.

## 2019-12-01 NOTE — ED Triage Notes (Signed)
Patient reports she went to the breast center on Friday as she had multiple nose bleeds. Patient reports she is being seen and treated there there for breast cancer and is currently on Warfarin. Patient reports her INR was elevated above 4, so she was told to hold her warfarin x2 days. Patient has continuously had nose bleeds off and on since Friday. Pt denies pain at this time.

## 2019-12-01 NOTE — ED Notes (Signed)
Pt stated that she can walk just fine but she feels a little unsteady on her feet. Pt walked to the restroom with no assistance.

## 2019-12-01 NOTE — Telephone Encounter (Signed)
Received VM from pt, attempt x1 to return call to pt, no answer, LVM to return call to the office.

## 2019-12-01 NOTE — Discharge Instructions (Addendum)
You were seen in the emergency department for bleeding from your nose.  Your INR is 1.2 and you are slightly more anemic than your baseline.  You have a nasal packing in which will need to stay there for a few days to stabilize the bleeding area.  Please contact Dr. Janeice Robinson office for close follow-up.  If you experience any worsening bleeding call Dr. Janeice Robinson office or return to the emergency department.

## 2019-12-01 NOTE — ED Provider Notes (Signed)
[facility] DEPT Provider Note   CSN: [id number] Arrival date & time: 12/01/19  8563     History Chief Complaint  Patient presents with  . Epistaxis    [name] Foster is a 69 y.o. female.  She has a history of breast cancer and is on Coumadin for dvt.  Complaining of nosebleed for the last 4 days intermittently.  Went to the cancer center 4 days ago and her INR was 4.  She was told to hold her Coumadin and use Afrin spray.  This worked inconsistently.  She said the bleeding is been persistent today more from the right side.  No trauma no fever no illness symptoms.  No prior history of significant nosebleeds.  The history is provided by the patient.  Epistaxis Location:  Bilateral Severity:  Moderate Duration:  4 days Timing:  Intermittent Progression:  Unchanged Chronicity:  New Context: anticoagulants   Relieved by:  Nothing Worsened by:  Nothing Ineffective treatments:  Vasoconstrictors Associated symptoms: blood in oropharynx   Associated symptoms: no cough, no fever, no headaches and no sore throat        Past Medical History:  Diagnosis Date  . Allergic rhinitis   . Aortic valve disorders   . Arthritis   . Asthma   . Cancer [facility])    Breast Cancer  . CHF (congestive heart failure) ([place])   . Coronary artery disease   . Family history of adverse reaction to anesthesia    difficulty waking mother up after surgery  . Family history of breast cancer   . Family history of cervical cancer   . Family history of colon cancer   . Family history of throat cancer   . Heart murmur   . Hyperlipidemia   . Hypertension   . Long term (current) use of anticoagulants   . Other primary cardiomyopathies   . Personal history of colonic polyps   . Personal history of venous thrombosis and embolism     Patient Active Problem List   Diagnosis Date Noted  . Dehydration   . Non-intractable vomiting   . Intractable nausea and vomiting 09/21/2019  .  Generalized weakness 09/21/2019  . AKI (acute kidney injury) ([place]) 09/21/2019  . Leukocytosis 09/21/2019  . Hypernatremia 09/21/2019  . Hyperchloremia 09/21/2019  . Thrombocytopenia ([place]) 09/21/2019  . Port-A-Cath in place 09/08/2019  . Family history of breast cancer   . Family history of throat cancer   . Family history of cervical cancer   . Malignant neoplasm of overlapping sites of left breast in female, estrogen receptor negative ([place]) 08/01/2019  . Family history of colon cancer mom 11/05/2018  . Long term (current) use of anticoagulants 09/17/2017  . Encounter for therapeutic drug monitoring 11/21/2013  . Asthma with bronchitis 09/01/2013  . Chest pain 01/01/2013  . Aortic insufficiency 10/25/2011  . Chronic anticoagulation 09/29/2011  . Nocturnal dyspnea 09/29/2011  . DVT (deep venous thrombosis) ([place]) 11/17/2010  . VITAMIN D DEFICIENCY 08/06/2009  . DVT 01/26/2009  . KNEE PAIN, RIGHT 06/19/2008  . EDEMA 06/19/2008  . ANKLE PAIN, LEFT 02/13/2008  . HYPERKALEMIA 08/20/2007  . HYPOKALEMIA 08/20/2007  . HLD (hyperlipidemia) 07/18/2007  . Cardiomyopathy, hypertensive ([place]) 07/18/2007  . Essential hypertension 06/26/2007  . Hypertensive heart disease with heart failure ([place]) 06/26/2007  . Congestive heart failure ([place]) 06/26/2007  . Allergic rhinitis, cause unspecified 06/26/2007  . ASTHMA 06/26/2007  . SYMPTOM, SYNCOPE AND COLLAPSE 06/26/2007  . HEADACHE 06/26/2007  . HEART MURMUR, HX  OF 06/26/2007  . DVT, HX OF 06/26/2007    Past Surgical History:  Procedure Laterality Date  . ABDOMINAL HYSTERECTOMY    . CARDIAC CATHETERIZATION    . COLON SURGERY     colonoscopy  . DILATION AND CURETTAGE OF UTERUS     several  . PORTACATH PLACEMENT Left 08/18/2019   Procedure: INSERTION PORT-A-CATH;  Surgeon: Stark Klein, MD;  Location: [facility];  Service: General;  Laterality: Left;  . Rt knee arthoscopic    . TEE WITHOUT CARDIOVERSION N/A 02/21/2013   Procedure:  TRANSESOPHAGEAL ECHOCARDIOGRAM (TEE);  Surgeon: Lelon Perla, MD;  [facility];  Service: Cardiovascular;  Laterality: N/A;     OB History   No obstetric history on file.     Family History  Problem Relation Age of Onset  . Alcohol abuse Other   . Depression Other   . Hyperlipidemia Other   . Hypertension Other   . Kidney disease Other   . Colon cancer Mother        dx late 23s  . Cervical cancer Maternal Grandmother   . Stroke Paternal Grandmother   . Breast cancer Maternal Aunt        dx 44s  . Breast cancer Cousin        dx 108s    Social History   Tobacco Use  . Smoking status: Former Smoker    Packs/day: 0.25    Quit date: 07/02/2019    Years since quitting: 0.4  . Smokeless tobacco: Never Used  Substance Use Topics  . Alcohol use: Yes    Comment: rare occasion  . Drug use: No    Home Medications Prior to Admission medications   Medication Sig Start Date End Date Taking? Authorizing Provider  Albuterol Sulfate (PROAIR RESPICLICK) 409 (90 Base) MCG/ACT AEPB Inhale 2 puffs into the lungs every 6 (six) hours as needed. Patient taking differently: Inhale 2 puffs into the lungs every 6 (six) hours as needed (SOB / wheezing).  11/27/18   Panosh, Standley Brooking, MD  amLODipine (NORVASC) 10 MG tablet TAKE 1 TABLET BY MOUTH  DAILY Patient taking differently: Take 10 mg by mouth daily.  08/11/19   Lelon Perla, MD  atorvastatin (LIPITOR) 20 MG tablet TAKE 1 TABLET BY MOUTH  DAILY Patient taking differently: Take 20 mg by mouth daily at 6 PM.  05/22/19   Crenshaw, Denice Bors, MD  Carboxymethylcellul-Glycerin (CLEAR EYES FOR DRY EYES) 1-0.25 % SOLN Place 1 drop into both eyes 2 (two) times daily as needed (dry eyes).    [provider]  Cholecalciferol (VITAMIN D) 50 MCG (2000 UT) tablet Take 2,000 Units by mouth daily.    [provider]  dexamethasone (DECADRON) 4 MG tablet Take 1 tablet (4 mg total) by mouth 2 (two) times daily. Take 1 tablet day  before chemo and 1 tablet day after chemo with food 08/06/19   Nicholas Lose, MD  fexofenadine (ALLEGRA) 180 MG tablet Take 180 mg by mouth daily as needed for allergies.     [provider]  fluticasone (FLONASE) 50 MCG/ACT nasal spray USE 2 SPRAYS IN EACH NOSTRIL DAILY. PT NEEDS TO SCHEDULE A FOLLOW UP APPT BEFORE NEXT REFILL. Patient taking differently: Place 2 sprays into both nostrils daily.  09/20/11   Panosh, Standley Brooking, MD  furosemide (LASIX) 40 MG tablet TAKE 1 TABLET BY MOUTH  DAILY Patient taking differently: Take 40 mg by mouth daily.  05/22/19   Lelon Perla, MD  hydrALAZINE (APRESOLINE) 50 MG tablet Take 0.5 tablets (25 mg total) by mouth 3 (three) times daily. 10/03/19   Domenic Polite, MD  lidocaine-prilocaine (EMLA) cream Apply to affected area once 08/06/19   Nicholas Lose, MD  LORazepam (ATIVAN) 0.5 MG tablet Take 1 tablet (0.5 mg total) by mouth at bedtime as needed for sleep. 08/06/19   Nicholas Lose, MD  metoprolol tartrate (LOPRESSOR) 50 MG tablet TAKE 1 TABLET BY MOUTH  TWICE DAILY Patient taking differently: Take 50 mg by mouth 2 (two) times daily.  05/22/19   Lelon Perla, MD  ondansetron (ZOFRAN) 8 MG tablet Take 1 tablet (8 mg total) by mouth 2 (two) times daily as needed (Nausea or vomiting). Begin 4 days after chemotherapy. 08/06/19   Nicholas Lose, MD  potassium chloride SA (KLOR-CON) 20 MEQ tablet TAKE 1 TABLET BY MOUTH  DAILY Patient taking differently: Take 20 mEq by mouth daily.  08/11/19   Lelon Perla, MD  prochlorperazine (COMPAZINE) 10 MG tablet Take 1 tablet (10 mg total) by mouth every 6 (six) hours as needed (Nausea or vomiting). 08/06/19   Nicholas Lose, MD  sodium chloride (OCEAN) 0.65 % SOLN nasal spray Place 1 spray into both nostrils as needed for congestion.    [provider]  warfarin (COUMADIN) 5 MG tablet Take 1 tablet daily or as directed by anticoagulation clinic. Patient taking differently: Take 2.5-5 mg by mouth See admin  instructions. Take 1 tablet daily or as directed by anticoagulation clinic; 5 mg all days except taking 2.5 mg on Wed 06/25/19   Panosh, Standley Brooking, MD    Allergies    Penicillins, Tetanus toxoid, and Aspirin  Review of Systems   Review of Systems  Constitutional: Negative for fever.  HENT: Positive for nosebleeds. Negative for sore throat.   Eyes: Negative for visual disturbance.  Respiratory: Negative for cough and shortness of breath.   Cardiovascular: Negative for chest pain.  Gastrointestinal: Negative for abdominal pain.  Genitourinary: Negative for dysuria.  Musculoskeletal: Negative for neck pain.  Skin: Negative for rash.  Neurological: Negative for headaches.    Physical Exam Updated Vital Signs BP 130/68 (BP Location: Left Arm)   Pulse 75   Temp 97.9 F (36.6 C) (Oral)   Resp 18   Ht 5\' 6"  (1.676 m)   Wt 87.5 kg   SpO2 100%   BMI 31.15 kg/m   Physical Exam Vitals and nursing note reviewed.  Constitutional:      General: She is not in acute distress.    Appearance: She is well-developed.  HENT:     Head: Normocephalic and atraumatic.     Nose:     Right Nostril: Epistaxis present.     Right Turbinates: Enlarged.     Left Turbinates: Enlarged.  Eyes:     Conjunctiva/sclera: Conjunctivae normal.  Cardiovascular:     Rate and Rhythm: Normal rate and regular rhythm.     Heart sounds: No murmur.  Pulmonary:     Effort: Pulmonary effort is normal. No respiratory distress.     Breath sounds: Normal breath sounds.  Abdominal:     Palpations: Abdomen is soft.     Tenderness: There is no abdominal tenderness.  Musculoskeletal:     Cervical back: Neck supple.  Skin:    General: Skin is warm and dry.  Neurological:     Mental Status: She is alert.     ED Results / Procedures / Treatments   Labs (all labs ordered are  listed, but only abnormal results are displayed) Labs Reviewed  CBC WITH DIFFERENTIAL/PLATELET - Abnormal; Notable for the following  components:      Result Value   RBC 2.60 (*)    Hemoglobin 7.8 (*)    HCT 25.2 (*)    All other components within normal limits  PROTIME-INR    EKG None  Radiology No results found.  Procedures .Epistaxis Management  Date/Time: 12/01/2019 8:10 AM Performed by: Hayden Rasmussen, MD Authorized by: Hayden Rasmussen, MD   Consent:    Consent obtained:  Verbal   Consent given by:  Patient   Risks discussed:  Bleeding, pain, nasal injury and infection Procedure details:    Treatment site:  R anterior   Treatment method:  Nasal balloon Post-procedure details:    Assessment:  Bleeding decreased   Patient tolerance of procedure:  Tolerated well, no immediate complications Comments:     Initiall placed Afrin impregnated cotton pledget in each side.  Unfortunately that made the right side start bleeding more briskly and switch that over to a nasal balloon with improvement.   (including critical care time)  Medications Ordered in ED Medications  oxymetazoline (AFRIN) 0.05 % nasal spray 1 spray (has no administration in time range)    ED Course  I have reviewed the triage vital signs and the nursing notes.  Pertinent labs & imaging results that were available during my care of the patient were reviewed by me and considered in my medical decision making (see chart for details).  Clinical Course as of Nov 30 1812  Mon Dec 01, 2019  9741 Discussed with Dr. Constance Holster ENT.  He said if the bleeding is stopped she can be discharged and follow-up in the office in a few days.  If she rebleeds she should call the office and be seen.   [MB]    Clinical Course User Index [MB] Hayden Rasmussen, MD   MDM Rules/Calculators/A&P                       Final Clinical Impression(s) / ED Diagnoses Final diagnoses:  Epistaxis  Malignant neoplasm of female breast, unspecified estrogen receptor status, unspecified laterality, unspecified site of breast [facility])    Rx / DC Orders ED Discharge  Orders    None       Hayden Rasmussen, MD 12/01/19 1815

## 2019-12-02 ENCOUNTER — Telehealth: Payer: Self-pay | Admitting: General Practice

## 2019-12-02 NOTE — Telephone Encounter (Signed)
LMOVM to call Villa Herb, RN @ [phone]

## 2019-12-05 DIAGNOSIS — R040 Epistaxis: Secondary | ICD-10-CM | POA: Diagnosis not present

## 2019-12-05 DIAGNOSIS — R6889 Other general symptoms and signs: Secondary | ICD-10-CM | POA: Diagnosis not present

## 2019-12-05 DIAGNOSIS — D689 Coagulation defect, unspecified: Secondary | ICD-10-CM | POA: Insufficient documentation

## 2019-12-11 NOTE — Progress Notes (Signed)
O I just need to have them done  Patient Care Team: Panosh, Standley Brooking, MD as PCP - General Crenshaw, Denice Bors, MD (Cardiology) Susa Day, MD (Orthopedic Surgery) Mauro Kaufmann, RN as Oncology Nurse Navigator Rockwell Germany, RN as Oncology Nurse Navigator  DIAGNOSIS:    ICD-10-CM   1. Malignant neoplasm of overlapping sites of left breast in female, estrogen receptor negative ([place])  C50.812 CANCELED: MR BREAST BILATERAL W [place] CAD   Z17.1     SUMMARY OF ONCOLOGIC HISTORY: Oncology History  Malignant neoplasm of overlapping sites of left breast in female, estrogen receptor negative ([place])  08/01/2019 Initial Diagnosis   Patient palpated left breast lump and skin thickening. Mammogram showed a 3.6cm mass at 3:30 position, a 0.9cm mass at 3:00 position, a 0.7cm mass at 2:00 position, a 0.5cm mass at 2:00 position, a 0.3cm mass at 1:00 position, and a 0.4cm mass at 1:00 position, with 4 abnormal left axillary lymph nodes. Biopsy showed IDC, grade 3, HER-2 + (3+), ER/PR -, Ki67 70%, in the breast and lymph nodes.    08/06/2019 Cancer Staging   Staging form: Breast, AJCC 8th Edition - Clinical: Stage IIIB (cT4, cN1, cM0, G3, ER-, PR-, HER2+) - Signed by Nicholas Lose, MD on 08/06/2019   08/19/2019 -  Chemotherapy   The patient had palonosetron (ALOXI) injection 0.25 mg, 0.25 mg, Intravenous,  Once, 5 of 6 cycles Administration: 0.25 mg (08/19/2019), 0.25 mg (09/08/2019), 0.25 mg (10/31/2019), 0.25 mg (11/22/2019) pegfilgrastim-cbqv (UDENYCA) injection 6 mg, 6 mg, Subcutaneous, Once, 2 of 2 cycles Administration: 6 mg (08/21/2019), 6 mg (09/10/2019) CARBOplatin (PARAPLATIN) 480 mg in sodium chloride 0.9 % 250 mL chemo infusion, 480 mg (110.9 % of original dose 429.6 mg), Intravenous,  Once, 2 of 2 cycles Dose modification:   (original dose 429.6 mg, Cycle 1) Administration: 480 mg (08/19/2019), 340 mg (09/08/2019) DOCEtaxel (TAXOTERE) 160 mg in sodium chloride 0.9 % 250 mL chemo  infusion, 75 mg/m2 = 160 mg, Intravenous,  Once, 5 of 6 cycles Dose modification: 60 mg/m2 (original dose 75 mg/m2, Cycle 2, Reason: Dose not tolerated), 50 mg/m2 (original dose 75 mg/m2, Cycle 5, Reason: Dose not tolerated) Administration: 160 mg (08/19/2019), 130 mg (09/08/2019), 110 mg (10/31/2019), 110 mg (11/22/2019) pertuzumab (PERJETA) 420 mg in sodium chloride 0.9 % 250 mL chemo infusion, 420 mg (100 % of original dose 420 mg), Intravenous, Once, 2 of 2 cycles Dose modification: 420 mg (original dose 420 mg, Cycle 1, Reason: Provider Judgment) Administration: 420 mg (08/19/2019), 420 mg (09/08/2019) fosaprepitant (EMEND) 150 mg, dexamethasone (DECADRON) 12 mg in sodium chloride 0.9 % 145 mL IVPB, , Intravenous,  Once, 3 of 3 cycles Administration:  (08/19/2019),  (09/08/2019),  (10/31/2019) trastuzumab-anns (KANJINTI) 750 mg in sodium chloride 0.9 % 250 mL chemo infusion, 756 mg (100 % of original dose 8 mg/kg), Intravenous,  Once, 5 of 6 cycles Dose modification: 8 mg/kg (original dose 8 mg/kg, Cycle 1, Reason: Other (see comments), Comment: change to approved brand), 6 mg/kg (original dose 6 mg/kg, Cycle 2, Reason: Other (see comments), Comment: brand change per insurance) Administration: 750 mg (08/19/2019), 567 mg (09/08/2019), 567 mg (10/31/2019), 567 mg (11/22/2019)  for chemotherapy treatment.    09/21/2019 - 10/03/2019 Hospital Admission   Intractable nausea and vomiting     CHIEF COMPLIANT: Cycle5Taxotere and Herceptin  INTERVAL HISTORY: [name] Foster is a 69 y.o. with above-mentioned history of left breast cancer.She is currently on neoadjuvant chemotherapywith Taxotere and Herceptin, as Carboplatin andPerjeta have  been discontinued.She was seen in the ED on 12/01/19 for epistaxis intermittently over 4 days. She presents to the clinic todayfor cycle5. She does not have any problems with nausea vomiting or dehydration or diarrhea.  ALLERGIES:  is allergic to penicillins; tetanus  toxoid; and aspirin.  MEDICATIONS:  Current Outpatient Medications  Medication Sig Dispense Refill  . Albuterol Sulfate (PROAIR RESPICLICK) 366 (90 Base) MCG/ACT AEPB Inhale 2 puffs into the lungs every 6 (six) hours as needed. (Patient taking differently: Inhale 2 puffs into the lungs every 6 (six) hours as needed (SOB / wheezing). ) 2 each 1  . amLODipine (NORVASC) 10 MG tablet TAKE 1 TABLET BY MOUTH  DAILY (Patient taking differently: Take 10 mg by mouth daily. ) 90 tablet 3  . atorvastatin (LIPITOR) 20 MG tablet TAKE 1 TABLET BY MOUTH  DAILY (Patient taking differently: Take 20 mg by mouth daily at 6 PM. ) 90 tablet 1  . Carboxymethylcellul-Glycerin (CLEAR EYES FOR DRY EYES) 1-0.25 % SOLN Place 1 drop into both eyes 2 (two) times daily as needed (dry eyes).    . Cholecalciferol (VITAMIN D) 50 MCG (2000 UT) tablet Take 2,000 Units by mouth daily.    Marland Kitchen dexamethasone (DECADRON) 4 MG tablet Take 1 tablet (4 mg total) by mouth 2 (two) times daily. Take 1 tablet day before chemo and 1 tablet day after chemo with food 12 tablet 0  . fexofenadine (ALLEGRA) 180 MG tablet Take 180 mg by mouth daily as needed for allergies.     . fluticasone (FLONASE) 50 MCG/ACT nasal spray USE 2 SPRAYS IN EACH NOSTRIL DAILY. PT NEEDS TO SCHEDULE A FOLLOW UP APPT BEFORE NEXT REFILL. (Patient taking differently: Place 2 sprays into both nostrils daily as needed for allergies. ) 16 g 0  . furosemide (LASIX) 40 MG tablet TAKE 1 TABLET BY MOUTH  DAILY (Patient taking differently: Take 40 mg by mouth daily. ) 90 tablet 1  . hydrALAZINE (APRESOLINE) 50 MG tablet Take 0.5 tablets (25 mg total) by mouth 3 (three) times daily. (Patient taking differently: Take 25 mg by mouth in the morning and at bedtime. ) 1 tablet 0  . lidocaine-prilocaine (EMLA) cream Apply to affected area once 30 g 3  . LORazepam (ATIVAN) 0.5 MG tablet Take 1 tablet (0.5 mg total) by mouth at bedtime as needed for sleep. 30 tablet 0  . metoprolol tartrate  (LOPRESSOR) 50 MG tablet TAKE 1 TABLET BY MOUTH  TWICE DAILY (Patient taking differently: Take 50 mg by mouth 2 (two) times daily. ) 180 tablet 1  . ondansetron (ZOFRAN) 8 MG tablet Take 1 tablet (8 mg total) by mouth 2 (two) times daily as needed (Nausea or vomiting). Begin 4 days after chemotherapy. 30 tablet 1  . potassium chloride SA (KLOR-CON) 20 MEQ tablet TAKE 1 TABLET BY MOUTH  DAILY (Patient taking differently: Take 20 mEq by mouth daily. ) 90 tablet 3  . prochlorperazine (COMPAZINE) 10 MG tablet Take 1 tablet (10 mg total) by mouth every 6 (six) hours as needed (Nausea or vomiting). 30 tablet 1  . sodium chloride (OCEAN) 0.65 % SOLN nasal spray Place 1 spray into both nostrils as needed for congestion.    Marland Kitchen warfarin (COUMADIN) 5 MG tablet Take 1 tablet daily or as directed by anticoagulation clinic. (Patient taking differently: Take 2.5-5 mg by mouth See admin instructions. Take 1 tablet daily or as directed by anticoagulation clinic; 5 mg all days except taking 2.5 mg on Wed) 100 tablet 1  No current facility-administered medications for this visit.   Facility-Administered Medications Ordered in Other Visits  Medication Dose Route Frequency Provider Last Rate Last Admin  . DOCEtaxel (TAXOTERE) 110 mg in sodium chloride 0.9 % 250 mL chemo infusion  50 mg/m2 (Treatment Plan Recorded) Intravenous Once Nicholas Lose, MD      . heparin lock flush 100 unit/mL  500 Units Intracatheter Once PRN Nicholas Lose, MD      . sodium chloride flush (NS) 0.9 % injection 10 mL  10 mL Intracatheter PRN Nicholas Lose, MD      . Theotis [facility]) 567 mg in sodium chloride 0.9 % 250 mL chemo infusion  6 mg/kg (Treatment Plan Recorded) Intravenous Once Nicholas Lose, MD 554 mL/hr at 12/12/19 1122 567 mg at 12/12/19 1122    PHYSICAL EXAMINATION: ECOG PERFORMANCE STATUS: 1 - Symptomatic but completely ambulatory  Vitals:   12/12/19 0924  BP: (!) 118/53  Pulse: 65  Resp: 18  Temp: 98.3 F (36.8  C)  SpO2: 100%    LABORATORY DATA:  I have reviewed the data as listed CMP Latest Ref Rng & Units 12/12/2019 11/28/2019 11/21/2019  Glucose 70 - 99 mg/dL 96 101(H) 102(H)  BUN 8 - 23 mg/dL '9 17 10  ' Creatinine 0.44 - 1.00 mg/dL 1.71(H) 1.55(H) 1.72(H)  Sodium 135 - 145 mmol/L 144 145 147(H)  Potassium 3.5 - 5.1 mmol/L 3.7 3.8 3.7  Chloride 98 - 111 mmol/L 108 108 110  CO2 22 - 32 mmol/L '28 27 28  ' Calcium 8.9 - 10.3 mg/dL 8.9 8.8(L) 8.8(L)  Total Protein 6.5 - 8.1 g/dL 6.4(L) 6.5 6.6  Total Bilirubin 0.3 - 1.2 mg/dL 0.2(L) 0.4 <0.2(L)  Alkaline Phos 38 - 126 U/L 107 107 114  AST 15 - 41 U/L 14(L) 12(L) 13(L)  ALT 0 - 44 U/L '8 10 10    ' Lab Results  Component Value Date   WBC 6.5 12/12/2019   HGB 9.2 (L) 12/12/2019   HCT 30.3 (L) 12/12/2019   MCV 94.7 12/12/2019   PLT 270 12/12/2019   NEUTROABS 3.4 12/12/2019    ASSESSMENT & PLAN:  Malignant neoplasm of overlapping sites of left breast in female, estrogen receptor negative ([place]) 08/01/2019:Patient palpated left breast lump and skin thickening. Mammogram showed a 3.6cm mass at 3:30 position, a 0.9cm mass at 3:00 position, a 0.7cm mass at 2:00 position, a 0.5cm mass at 2:00 position, a 0.3cm mass at 1:00 position, and a 0.4cm mass at 1:00 position, with 4 abnormal left axillary lymph nodes. Biopsy showed IDC, grade 3, HER-2 + (3+), ER/PR -, Ki67 70%, in the breast and lymph nodes.  Treatment plan 1. Neoadjuvant chemotherapy with TCH Perjeta 6 cycles followed by HerceptinPerjetamaintenance versus Kadcyla maintenance (depending on response to chemo)for 1 year 2. Followed bymastectomy withaxillary lymph node dissection 3. Followed by adjuvant radiation therapy  CT CAP 08/12/2019: 3.9 cm left breast mass with left axillary lymph nodes. 10 mm lesion upper pole of left kidney suspicious for small renal cell cancer. 3 mm left lower lobe nodule, 4 mm subcapsular lesion anterior hepatic dome too small Bone scan 08/15/2019:  Benign Breast MRI 08/12/2019: Locally advanced left breast cancer 4 cm, multiple irregular enhancing masses, diffuse skin thickening and enhancement and multiple pathogenic level 1 axillary lymph nodes at least 5-6 -------------------------------------------------------------------------------------------------------------------------------------------------- Current treatment:Cycle 5TCH Perjeta(we will discontinue Perjeta and carboplatin and reducedthe dosage of Taxotere) Chemo toxicities: 1. Hospitalization after second cycle of chemo 09/21/2019:Forintractable nausea and vomiting:Carboplatin was discontinued and dose of Taxotere reduced.  2. Diarrhea and dehydration: No further issues with diarrhea 3. Loss of taste and appetite: Same as before 4. Profound fatigue: Stable 5 .Chemotherapy-induced anemia: Today's hemoglobin is 9.4  INR: 1.5, I encouraged her to increase the dosage of Coumadin to 7.5 mg on one of the 7 days.  The rest of the days she gets 5 mg. Chronic renal failure:Being monitored closely.  Okay to treat  Return to clinic in 3 weeks for cycle 6 We will set her up for post neoadjuvant breast MRIs and surgery follow-ups    No orders of the defined types were placed in this encounter.  The patient has a good understanding of the overall plan. she agrees with it. she will call with any problems that may develop before the next visit here.  Total time spent: 30 mins including face to face time and time spent for planning, charting and coordination of care  Nicholas Lose, MD 12/12/2019  I, Cloyde Reams Dorshimer, am acting as scribe for Dr. Nicholas Lose.  I have reviewed the above documentation for accuracy and completeness, and I agree with the above.

## 2019-12-12 ENCOUNTER — Encounter: Payer: Self-pay | Admitting: *Deleted

## 2019-12-12 ENCOUNTER — Inpatient Hospital Stay: Payer: Medicare Other

## 2019-12-12 ENCOUNTER — Other Ambulatory Visit: Payer: Self-pay

## 2019-12-12 ENCOUNTER — Inpatient Hospital Stay (HOSPITAL_BASED_OUTPATIENT_CLINIC_OR_DEPARTMENT_OTHER): Payer: Medicare Other | Admitting: Hematology and Oncology

## 2019-12-12 ENCOUNTER — Inpatient Hospital Stay: Payer: Medicare Other | Attending: Hematology and Oncology

## 2019-12-12 DIAGNOSIS — C50812 Malignant neoplasm of overlapping sites of left female breast: Secondary | ICD-10-CM

## 2019-12-12 DIAGNOSIS — Z5112 Encounter for antineoplastic immunotherapy: Secondary | ICD-10-CM | POA: Diagnosis not present

## 2019-12-12 DIAGNOSIS — Z5111 Encounter for antineoplastic chemotherapy: Secondary | ICD-10-CM | POA: Insufficient documentation

## 2019-12-12 DIAGNOSIS — Z171 Estrogen receptor negative status [ER-]: Secondary | ICD-10-CM

## 2019-12-12 DIAGNOSIS — Z7901 Long term (current) use of anticoagulants: Secondary | ICD-10-CM | POA: Diagnosis not present

## 2019-12-12 LAB — CBC WITH DIFFERENTIAL (CANCER CENTER ONLY)
Abs Immature Granulocytes: 0.05 10*3/uL (ref 0.00–0.07)
Basophils Absolute: 0.1 10*3/uL (ref 0.0–0.1)
Basophils Relative: 1 %
Eosinophils Absolute: 0 10*3/uL (ref 0.0–0.5)
Eosinophils Relative: 0 %
HCT: 30.3 % — ABNORMAL LOW (ref 36.0–46.0)
Hemoglobin: 9.2 g/dL — ABNORMAL LOW (ref 12.0–15.0)
Immature Granulocytes: 1 %
Lymphocytes Relative: 37 %
Lymphs Abs: 2.4 10*3/uL (ref 0.7–4.0)
MCH: 28.8 pg (ref 26.0–34.0)
MCHC: 30.4 g/dL (ref 30.0–36.0)
MCV: 94.7 fL (ref 80.0–100.0)
Monocytes Absolute: 0.6 10*3/uL (ref 0.1–1.0)
Monocytes Relative: 9 %
Neutro Abs: 3.4 10*3/uL (ref 1.7–7.7)
Neutrophils Relative %: 52 %
Platelet Count: 270 10*3/uL (ref 150–400)
RBC: 3.2 MIL/uL — ABNORMAL LOW (ref 3.87–5.11)
RDW: 14.1 % (ref 11.5–15.5)
WBC Count: 6.5 10*3/uL (ref 4.0–10.5)
nRBC: 0 % (ref 0.0–0.2)

## 2019-12-12 LAB — CMP (CANCER CENTER ONLY)
ALT: 8 U/L (ref 0–44)
AST: 14 U/L — ABNORMAL LOW (ref 15–41)
Albumin: 3.3 g/dL — ABNORMAL LOW (ref 3.5–5.0)
Alkaline Phosphatase: 107 U/L (ref 38–126)
Anion gap: 8 (ref 5–15)
BUN: 9 mg/dL (ref 8–23)
CO2: 28 mmol/L (ref 22–32)
Calcium: 8.9 mg/dL (ref 8.9–10.3)
Chloride: 108 mmol/L (ref 98–111)
Creatinine: 1.71 mg/dL — ABNORMAL HIGH (ref 0.44–1.00)
GFR, Est AFR Am: 35 mL/min — ABNORMAL LOW (ref >60)
GFR, Estimated: 30 mL/min — ABNORMAL LOW (ref >60)
Glucose, Bld: 96 mg/dL (ref 70–99)
Potassium: 3.7 mmol/L (ref 3.5–5.1)
Sodium: 144 mmol/L (ref 135–145)
Total Bilirubin: 0.2 mg/dL — ABNORMAL LOW (ref 0.3–1.2)
Total Protein: 6.4 g/dL — ABNORMAL LOW (ref 6.5–8.1)

## 2019-12-12 LAB — PROTIME-INR
INR: 1.4 — ABNORMAL HIGH (ref 0.8–1.2)
Prothrombin Time: 17.4 seconds — ABNORMAL HIGH (ref 11.4–15.2)

## 2019-12-12 MED ORDER — TRASTUZUMAB-ANNS CHEMO 150 MG IV SOLR
6.0000 mg/kg | Freq: Once | INTRAVENOUS | Status: AC
  Administered 2019-12-12: 567 mg via INTRAVENOUS
  Filled 2019-12-12: qty 27

## 2019-12-12 MED ORDER — ACETAMINOPHEN 325 MG PO TABS
ORAL_TABLET | ORAL | Status: AC
  Filled 2019-12-12: qty 2

## 2019-12-12 MED ORDER — PALONOSETRON HCL INJECTION 0.25 MG/5ML
0.2500 mg | Freq: Once | INTRAVENOUS | Status: AC
  Administered 2019-12-12: 0.25 mg via INTRAVENOUS

## 2019-12-12 MED ORDER — DIPHENHYDRAMINE HCL 25 MG PO CAPS
50.0000 mg | ORAL_CAPSULE | Freq: Once | ORAL | Status: AC
  Administered 2019-12-12: 50 mg via ORAL

## 2019-12-12 MED ORDER — DEXAMETHASONE SODIUM PHOSPHATE 10 MG/ML IJ SOLN
10.0000 mg | Freq: Once | INTRAMUSCULAR | Status: AC
  Administered 2019-12-12: 10 mg via INTRAVENOUS

## 2019-12-12 MED ORDER — DIPHENHYDRAMINE HCL 25 MG PO CAPS
ORAL_CAPSULE | ORAL | Status: AC
  Filled 2019-12-12: qty 1

## 2019-12-12 MED ORDER — PALONOSETRON HCL INJECTION 0.25 MG/5ML
INTRAVENOUS | Status: AC
  Filled 2019-12-12: qty 5

## 2019-12-12 MED ORDER — ACETAMINOPHEN 325 MG PO TABS
650.0000 mg | ORAL_TABLET | Freq: Once | ORAL | Status: AC
  Administered 2019-12-12: 650 mg via ORAL

## 2019-12-12 MED ORDER — SODIUM CHLORIDE 0.9 % IV SOLN
Freq: Once | INTRAVENOUS | Status: AC
  Filled 2019-12-12: qty 250

## 2019-12-12 MED ORDER — DEXAMETHASONE SODIUM PHOSPHATE 10 MG/ML IJ SOLN
INTRAMUSCULAR | Status: AC
  Filled 2019-12-12: qty 1

## 2019-12-12 MED ORDER — SODIUM CHLORIDE 0.9% FLUSH
10.0000 mL | INTRAVENOUS | Status: DC | PRN
  Administered 2019-12-12: 10 mL
  Filled 2019-12-12: qty 10

## 2019-12-12 MED ORDER — SODIUM CHLORIDE 0.9 % IV SOLN
50.0000 mg/m2 | Freq: Once | INTRAVENOUS | Status: AC
  Administered 2019-12-12: 110 mg via INTRAVENOUS
  Filled 2019-12-12: qty 11

## 2019-12-12 MED ORDER — HEPARIN SOD (PORK) LOCK FLUSH 100 UNIT/ML IV SOLN
500.0000 [IU] | Freq: Once | INTRAVENOUS | Status: AC | PRN
  Administered 2019-12-12: 500 [IU]
  Filled 2019-12-12: qty 5

## 2019-12-12 NOTE — Patient Instructions (Signed)
[name] Discharge Instructions for Patients Receiving Chemotherapy  Today you received the following chemotherapy agents: Trastuzumab, Taxotere   To help prevent nausea and vomiting after your treatment, we encourage you to take your nausea medication as directed.    If you develop nausea and vomiting that is not controlled by your nausea medication, call the clinic.   BELOW ARE SYMPTOMS THAT SHOULD BE REPORTED IMMEDIATELY:  *FEVER GREATER THAN 100.5 F  *CHILLS WITH OR WITHOUT FEVER  NAUSEA AND VOMITING THAT IS NOT CONTROLLED WITH YOUR NAUSEA MEDICATION  *UNUSUAL SHORTNESS OF BREATH  *UNUSUAL BRUISING OR BLEEDING  TENDERNESS IN MOUTH AND THROAT WITH OR WITHOUT PRESENCE OF ULCERS  *URINARY PROBLEMS  *BOWEL PROBLEMS  UNUSUAL RASH Items with * indicate a potential emergency and should be followed up as soon as possible.  Feel free to call the clinic should you have any questions or concerns. The clinic phone number is [phone].  Please show the [name] at check-in to the Emergency Department and triage nurse.

## 2019-12-12 NOTE — Assessment & Plan Note (Signed)
08/01/2019:Patient palpated left breast lump and skin thickening. Mammogram showed a 3.6cm mass at 3:30 position, a 0.9cm mass at 3:00 position, a 0.7cm mass at 2:00 position, a 0.5cm mass at 2:00 position, a 0.3cm mass at 1:00 position, and a 0.4cm mass at 1:00 position, with 4 abnormal left axillary lymph nodes. Biopsy showed IDC, grade 3, HER-2 + (3+), ER/PR -, Ki67 70%, in the breast and lymph nodes.  Treatment plan 1. Neoadjuvant chemotherapy with TCH Perjeta 6 cycles followed by HerceptinPerjetamaintenance versus Kadcyla maintenance (depending on response to chemo)for 1 year 2. Followed bymastectomy withaxillary lymph node dissection 3. Followed by adjuvant radiation therapy  CT CAP 08/12/2019: 3.9 cm left breast mass with left axillary lymph nodes. 10 mm lesion upper pole of left kidney suspicious for small renal cell cancer. 3 mm left lower lobe nodule, 4 mm subcapsular lesion anterior hepatic dome too small Bone scan 08/15/2019: Benign Breast MRI 08/12/2019: Locally advanced left breast cancer 4 cm, multiple irregular enhancing masses, diffuse skin thickening and enhancement and multiple pathogenic level 1 axillary lymph nodes at least 5-6 -------------------------------------------------------------------------------------------------------------------------------------------------- Current treatment:Cycle 5TCH Perjeta(we will discontinue Perjeta and carboplatin and reducedthe dosage of Taxotere) Chemo toxicities: 1. Hospitalization after second cycle of chemo 09/21/2019:Forintractable nausea and vomiting:Carboplatin was discontinued and dose of Taxotere reduced. 2. Diarrhea and dehydration: No further issues with diarrhea 3. Loss of taste and appetite: Same as before 4. Profound fatigue: Stable 5 .Chemotherapy-induced anemia: Today's hemoglobin is 9.4  INR: 1.5, I encouraged her to increase the dosage of Coumadin to 7.5 mg on one of the 7 days.  The rest of  the days she gets 5 mg. Chronic renal failure:Being monitored closely.  Okay to treat  Return to clinic in 3 weeks for cycle 6 We will set her up for post neoadjuvant breast MRIs and surgery follow-ups

## 2019-12-16 ENCOUNTER — Telehealth: Payer: Self-pay | Admitting: *Deleted

## 2019-12-16 NOTE — Telephone Encounter (Signed)
Left vm regarding breast MRI scheduled for 4/5 at 11:30 at [facility]. Contact information provided for questions or needs.

## 2019-12-17 ENCOUNTER — Ambulatory Visit: Payer: Medicare Other | Attending: Internal Medicine

## 2019-12-17 DIAGNOSIS — Z23 Encounter for immunization: Secondary | ICD-10-CM

## 2019-12-17 NOTE — Progress Notes (Signed)
   Covid-19 Vaccination Clinic  Name:  Rasheema Truluck    MRN: [id number] DOB: May 24, 1951  12/17/2019  [name] was observed post Covid-19 immunization for 15 minutes without incident. She was provided with Vaccine Information Sheet and instruction to access the V-Safe system.   [name] was instructed to call 911 with any severe reactions post vaccine: Marland Kitchen Difficulty breathing  . Swelling of face and throat  . A fast heartbeat  . A bad rash all over body  . Dizziness and weakness   Immunizations Administered    Name Date Dose VIS Date Route   Pfizer COVID-19 Vaccine 12/17/2019  8:34 AM 0.3 mL 09/12/2019 Intramuscular   Manufacturer: [place]   Lot: [id number]   [place]: [address]-9

## 2019-12-24 ENCOUNTER — Telehealth: Payer: Self-pay | Admitting: General Practice

## 2019-12-24 NOTE — Telephone Encounter (Signed)
LMOVM to call Villa Herb, RN @ [phone].

## 2019-12-29 ENCOUNTER — Other Ambulatory Visit: Payer: Self-pay | Admitting: *Deleted

## 2019-12-29 ENCOUNTER — Other Ambulatory Visit: Payer: Self-pay | Admitting: Adult Health

## 2019-12-29 DIAGNOSIS — I119 Hypertensive heart disease without heart failure: Secondary | ICD-10-CM

## 2019-12-29 DIAGNOSIS — I5032 Chronic diastolic (congestive) heart failure: Secondary | ICD-10-CM

## 2019-12-29 DIAGNOSIS — I110 Hypertensive heart disease with heart failure: Secondary | ICD-10-CM

## 2019-12-29 DIAGNOSIS — C50812 Malignant neoplasm of overlapping sites of left female breast: Secondary | ICD-10-CM

## 2019-12-29 DIAGNOSIS — Z171 Estrogen receptor negative status [ER-]: Secondary | ICD-10-CM

## 2019-12-29 DIAGNOSIS — Z5181 Encounter for therapeutic drug level monitoring: Secondary | ICD-10-CM

## 2019-12-29 NOTE — Progress Notes (Signed)
Pharmacist Chemotherapy Monitoring - Follow Up Assessment    I verify that I have reviewed each item in the below checklist:  . Regimen for the patient is scheduled for the appropriate day and plan matches scheduled date. Marland Kitchen Appropriate non-routine labs are ordered dependent on drug ordered. . If applicable, additional medications reviewed and ordered per protocol based on lifetime cumulative doses and/or treatment regimen.   Plan for follow-up and/or issues identified: Yes . I-vent associated with next due treatment: Yes . MD and/or nursing notified: Yes  Tora Kindred 12/29/2019 11:50 AM

## 2020-01-01 ENCOUNTER — Other Ambulatory Visit: Payer: Self-pay

## 2020-01-01 ENCOUNTER — Ambulatory Visit (HOSPITAL_COMMUNITY)
Admission: RE | Admit: 2020-01-01 | Discharge: 2020-01-01 | Disposition: A | Discharge status: Home / Self Care | Payer: Medicare Other | Source: Ambulatory Visit | Attending: Hematology and Oncology | Admitting: Hematology and Oncology

## 2020-01-01 DIAGNOSIS — C50812 Malignant neoplasm of overlapping sites of left female breast: Secondary | ICD-10-CM | POA: Insufficient documentation

## 2020-01-01 DIAGNOSIS — I2510 Atherosclerotic heart disease of native coronary artery without angina pectoris: Secondary | ICD-10-CM | POA: Diagnosis not present

## 2020-01-01 DIAGNOSIS — I43 Cardiomyopathy in diseases classified elsewhere: Secondary | ICD-10-CM | POA: Insufficient documentation

## 2020-01-01 DIAGNOSIS — I5032 Chronic diastolic (congestive) heart failure: Secondary | ICD-10-CM

## 2020-01-01 DIAGNOSIS — I110 Hypertensive heart disease with heart failure: Secondary | ICD-10-CM

## 2020-01-01 DIAGNOSIS — Z5181 Encounter for therapeutic drug level monitoring: Secondary | ICD-10-CM

## 2020-01-01 DIAGNOSIS — E785 Hyperlipidemia, unspecified: Secondary | ICD-10-CM | POA: Insufficient documentation

## 2020-01-01 DIAGNOSIS — Z01818 Encounter for other preprocedural examination: Secondary | ICD-10-CM | POA: Diagnosis not present

## 2020-01-01 DIAGNOSIS — I351 Nonrheumatic aortic (valve) insufficiency: Secondary | ICD-10-CM | POA: Insufficient documentation

## 2020-01-01 DIAGNOSIS — I119 Hypertensive heart disease without heart failure: Secondary | ICD-10-CM

## 2020-01-01 DIAGNOSIS — Z171 Estrogen receptor negative status [ER-]: Secondary | ICD-10-CM

## 2020-01-01 NOTE — Progress Notes (Signed)
  Echocardiogram 2D Echocardiogram has been performed.  [name] Foster [name] 01/01/2020, 11:06 AM

## 2020-01-02 ENCOUNTER — Other Ambulatory Visit: Payer: Self-pay

## 2020-01-02 ENCOUNTER — Encounter: Payer: Self-pay | Admitting: *Deleted

## 2020-01-02 ENCOUNTER — Inpatient Hospital Stay: Payer: Medicare Other | Attending: Hematology and Oncology

## 2020-01-02 ENCOUNTER — Inpatient Hospital Stay: Payer: Medicare Other

## 2020-01-02 VITALS — BP 126/67 | HR 69 | Temp 98.5°F | Resp 19 | Ht 66.0 in | Wt 189.0 lb

## 2020-01-02 DIAGNOSIS — C50812 Malignant neoplasm of overlapping sites of left female breast: Secondary | ICD-10-CM | POA: Diagnosis not present

## 2020-01-02 DIAGNOSIS — Z5111 Encounter for antineoplastic chemotherapy: Secondary | ICD-10-CM | POA: Diagnosis not present

## 2020-01-02 DIAGNOSIS — Z5112 Encounter for antineoplastic immunotherapy: Secondary | ICD-10-CM | POA: Insufficient documentation

## 2020-01-02 DIAGNOSIS — Z7901 Long term (current) use of anticoagulants: Secondary | ICD-10-CM | POA: Insufficient documentation

## 2020-01-02 LAB — CBC WITH DIFFERENTIAL (CANCER CENTER ONLY)
Abs Immature Granulocytes: 0.08 10*3/uL — ABNORMAL HIGH (ref 0.00–0.07)
Basophils Absolute: 0 10*3/uL (ref 0.0–0.1)
Basophils Relative: 0 %
Eosinophils Absolute: 0 10*3/uL (ref 0.0–0.5)
Eosinophils Relative: 0 %
HCT: 30.8 % — ABNORMAL LOW (ref 36.0–46.0)
Hemoglobin: 9.2 g/dL — ABNORMAL LOW (ref 12.0–15.0)
Immature Granulocytes: 1 %
Lymphocytes Relative: 26 %
Lymphs Abs: 2.2 10*3/uL (ref 0.7–4.0)
MCH: 28.3 pg (ref 26.0–34.0)
MCHC: 29.9 g/dL — ABNORMAL LOW (ref 30.0–36.0)
MCV: 94.8 fL (ref 80.0–100.0)
Monocytes Absolute: 0.6 10*3/uL (ref 0.1–1.0)
Monocytes Relative: 7 %
Neutro Abs: 5.4 10*3/uL (ref 1.7–7.7)
Neutrophils Relative %: 66 %
Platelet Count: 278 10*3/uL (ref 150–400)
RBC: 3.25 MIL/uL — ABNORMAL LOW (ref 3.87–5.11)
RDW: 13.5 % (ref 11.5–15.5)
WBC Count: 8.2 10*3/uL (ref 4.0–10.5)
nRBC: 0 % (ref 0.0–0.2)

## 2020-01-02 LAB — CMP (CANCER CENTER ONLY)
ALT: 7 U/L (ref 0–44)
AST: 12 U/L — ABNORMAL LOW (ref 15–41)
Albumin: 3.1 g/dL — ABNORMAL LOW (ref 3.5–5.0)
Alkaline Phosphatase: 101 U/L (ref 38–126)
Anion gap: 11 (ref 5–15)
BUN: 12 mg/dL (ref 8–23)
CO2: 26 mmol/L (ref 22–32)
Calcium: 9.2 mg/dL (ref 8.9–10.3)
Chloride: 110 mmol/L (ref 98–111)
Creatinine: 1.99 mg/dL — ABNORMAL HIGH (ref 0.44–1.00)
GFR, Est AFR Am: 29 mL/min — ABNORMAL LOW (ref >60)
GFR, Estimated: 25 mL/min — ABNORMAL LOW (ref >60)
Glucose, Bld: 122 mg/dL — ABNORMAL HIGH (ref 70–99)
Potassium: 3.8 mmol/L (ref 3.5–5.1)
Sodium: 147 mmol/L — ABNORMAL HIGH (ref 135–145)
Total Bilirubin: <0.2 mg/dL — ABNORMAL LOW (ref 0.3–1.2)
Total Protein: 6.8 g/dL (ref 6.5–8.1)

## 2020-01-02 LAB — PROTIME-INR
INR: 1.7 — ABNORMAL HIGH (ref 0.8–1.2)
Prothrombin Time: 19.6 seconds — ABNORMAL HIGH (ref 11.4–15.2)

## 2020-01-02 MED ORDER — DIPHENHYDRAMINE HCL 25 MG PO CAPS
50.0000 mg | ORAL_CAPSULE | Freq: Once | ORAL | Status: AC
  Administered 2020-01-02: 10:00:00 50 mg via ORAL

## 2020-01-02 MED ORDER — SODIUM CHLORIDE 0.9% FLUSH
10.0000 mL | INTRAVENOUS | Status: DC | PRN
  Administered 2020-01-02: 10 mL
  Filled 2020-01-02: qty 10

## 2020-01-02 MED ORDER — DIPHENHYDRAMINE HCL 25 MG PO CAPS
ORAL_CAPSULE | ORAL | Status: AC
  Filled 2020-01-02: qty 2

## 2020-01-02 MED ORDER — DEXAMETHASONE SODIUM PHOSPHATE 10 MG/ML IJ SOLN
10.0000 mg | Freq: Once | INTRAMUSCULAR | Status: AC
  Administered 2020-01-02: 10 mg via INTRAVENOUS

## 2020-01-02 MED ORDER — SODIUM CHLORIDE 0.9 % IV SOLN
50.0000 mg/m2 | Freq: Once | INTRAVENOUS | Status: AC
  Administered 2020-01-02: 110 mg via INTRAVENOUS
  Filled 2020-01-02: qty 11

## 2020-01-02 MED ORDER — DEXAMETHASONE SODIUM PHOSPHATE 10 MG/ML IJ SOLN
INTRAMUSCULAR | Status: AC
  Filled 2020-01-02: qty 1

## 2020-01-02 MED ORDER — PALONOSETRON HCL INJECTION 0.25 MG/5ML
INTRAVENOUS | Status: AC
  Filled 2020-01-02: qty 5

## 2020-01-02 MED ORDER — TRASTUZUMAB-ANNS CHEMO 150 MG IV SOLR
6.0000 mg/kg | Freq: Once | INTRAVENOUS | Status: AC
  Administered 2020-01-02: 504 mg via INTRAVENOUS
  Filled 2020-01-02: qty 24

## 2020-01-02 MED ORDER — ACETAMINOPHEN 325 MG PO TABS
ORAL_TABLET | ORAL | Status: AC
  Filled 2020-01-02: qty 2

## 2020-01-02 MED ORDER — PALONOSETRON HCL INJECTION 0.25 MG/5ML
0.2500 mg | Freq: Once | INTRAVENOUS | Status: AC
  Administered 2020-01-02: 10:00:00 0.25 mg via INTRAVENOUS

## 2020-01-02 MED ORDER — SODIUM CHLORIDE 0.9 % IV SOLN
Freq: Once | INTRAVENOUS | Status: AC
  Filled 2020-01-02: qty 250

## 2020-01-02 MED ORDER — TRASTUZUMAB-ANNS CHEMO 150 MG IV SOLR: 6.0000 mg/kg | Freq: Once | INTRAVENOUS | Status: DC

## 2020-01-02 MED ORDER — ACETAMINOPHEN 325 MG PO TABS
650.0000 mg | ORAL_TABLET | Freq: Once | ORAL | Status: AC
  Administered 2020-01-02: 650 mg via ORAL

## 2020-01-02 MED ORDER — HEPARIN SOD (PORK) LOCK FLUSH 100 UNIT/ML IV SOLN
500.0000 [IU] | Freq: Once | INTRAVENOUS | Status: AC | PRN
  Administered 2020-01-02: 500 [IU]
  Filled 2020-01-02: qty 5

## 2020-01-02 NOTE — Progress Notes (Signed)
Per Mendel Ryder, adjust dose of Kanjinti for weight loss - orders updated. Also okay to treat w/ Kanjinti in setting of pending ECHO results - patient denies shortness of breath or any new symptoms that may suggest change in heart function.  Demetrius Charity, PharmD, BCPS, [facility] Pharmacist Pharmacy Phone: [phone] 01/02/2020

## 2020-01-02 NOTE — Patient Instructions (Signed)
[place] Discharge Instructions for Patients Receiving Chemotherapy  Today you received the following chemotherapy agents: Trastuzumab, Taxotere   To help prevent nausea and vomiting after your treatment, we encourage you to take your nausea medication as directed.    If you develop nausea and vomiting that is not controlled by your nausea medication, call the clinic.   BELOW ARE SYMPTOMS THAT SHOULD BE REPORTED IMMEDIATELY:  *FEVER GREATER THAN 100.5 F  *CHILLS WITH OR WITHOUT FEVER  NAUSEA AND VOMITING THAT IS NOT CONTROLLED WITH YOUR NAUSEA MEDICATION  *UNUSUAL SHORTNESS OF BREATH  *UNUSUAL BRUISING OR BLEEDING  TENDERNESS IN MOUTH AND THROAT WITH OR WITHOUT PRESENCE OF ULCERS  *URINARY PROBLEMS  *BOWEL PROBLEMS  UNUSUAL RASH Items with * indicate a potential emergency and should be followed up as soon as possible.  Feel free to call the clinic should you have any questions or concerns. The clinic phone number is [phone].  Please show the [place] at check-in to the Emergency Department and triage nurse.

## 2020-01-02 NOTE — Progress Notes (Signed)
Per Wilber Bihari NP okay to treat with Crt of 1.99

## 2020-01-05 ENCOUNTER — Ambulatory Visit (HOSPITAL_COMMUNITY)
Admission: RE | Admit: 2020-01-05 | Discharge: 2020-01-05 | Disposition: A | Discharge status: Home / Self Care | Payer: Medicare Other | Source: Ambulatory Visit | Attending: Hematology and Oncology | Admitting: Hematology and Oncology

## 2020-01-05 ENCOUNTER — Encounter: Payer: Self-pay | Admitting: *Deleted

## 2020-01-05 ENCOUNTER — Other Ambulatory Visit: Payer: Self-pay

## 2020-01-05 DIAGNOSIS — Z171 Estrogen receptor negative status [ER-]: Secondary | ICD-10-CM | POA: Diagnosis not present

## 2020-01-05 DIAGNOSIS — C50812 Malignant neoplasm of overlapping sites of left female breast: Secondary | ICD-10-CM | POA: Diagnosis not present

## 2020-01-05 DIAGNOSIS — C50912 Malignant neoplasm of unspecified site of left female breast: Secondary | ICD-10-CM | POA: Diagnosis not present

## 2020-01-05 IMAGING — MR MR BREAST BILAT WO/W CM
7 of 10 series · 29 of 48 positions shown · IV contrast (gadavist)
Comparison: Previous exam(s).

CLINICAL DATA: 68-year-old female with known metastatic left breast
cancer presenting for re-evaluation after neoadjuvant chemotherapy.

LABS:  None performed on site.
EXAM:
BILATERAL BREAST MRI WITH AND WITHOUT CONTRAST
TECHNIQUE: Multiplanar, multisequence MR images of both breasts were obtained
prior to and following the intravenous administration of 8 ml of
Gadavist.

[Series 3: T2 · axial · 3.0mm · 0.89mm/px · z∈[-63,+135]mm · 2 of 54 slices shown]
[im 1/54]
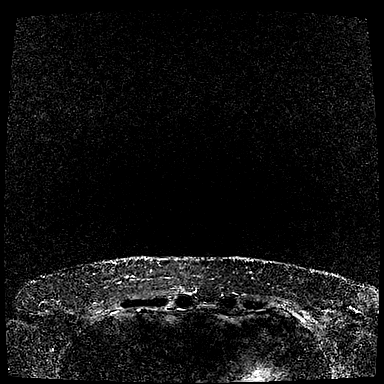
[im 54/54]
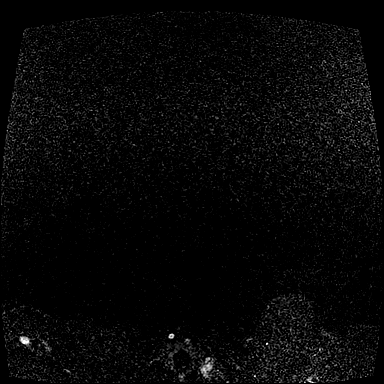

[Series 4: T1 fat-sat · axial · 1.2mm · 0.76mm/px · z∈[-59,+132]mm · 7 of 157 slices shown (1 of 4)]
[im 1/157]
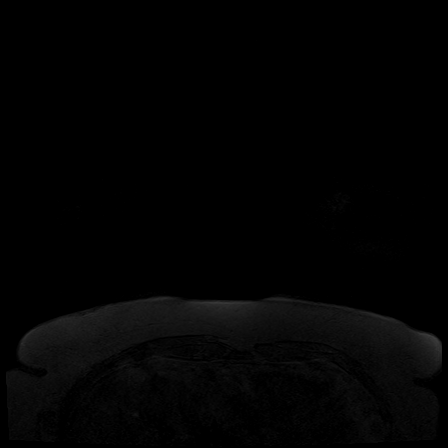
[im 27/157]
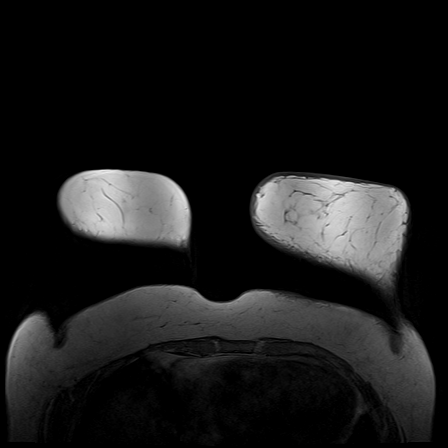
[im 53/157]
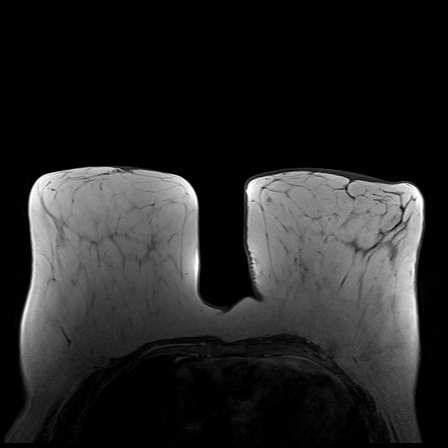
[im 79/157]
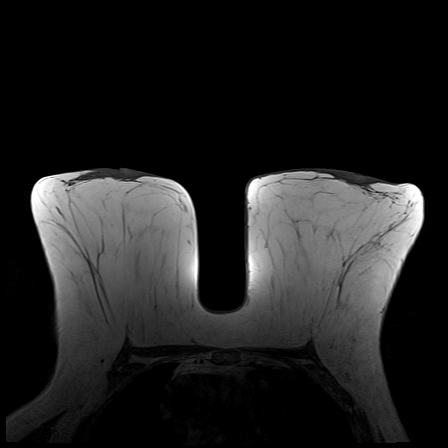
[im 105/157]
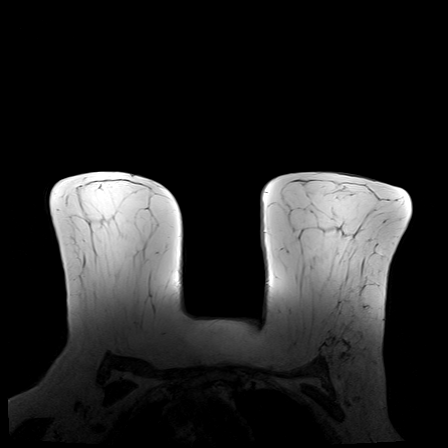
[im 131/157]
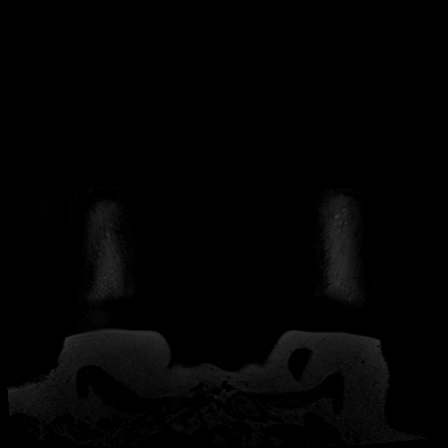
[im 157/157]
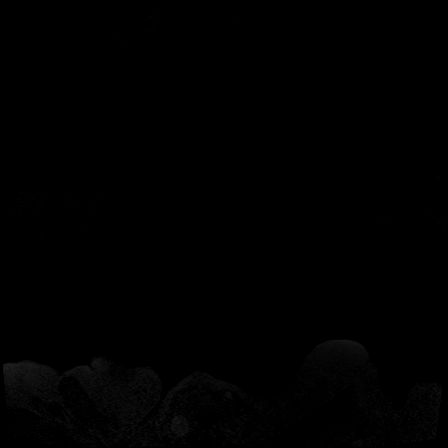

[Series 7: T1 fat-sat · axial · 1.6mm · 0.82mm/px · z∈[-70,+133]mm · 5 of 117 slices shown (2 of 4)]
[im 1/117]
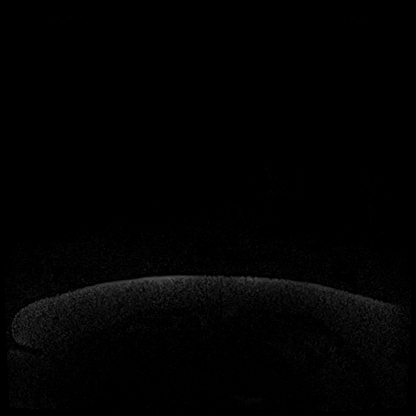
[im 30/117]
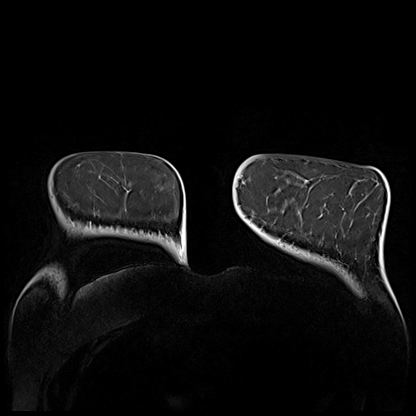
[im 59/117]
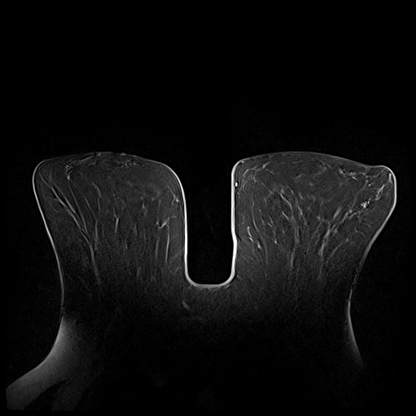
[im 88/117]
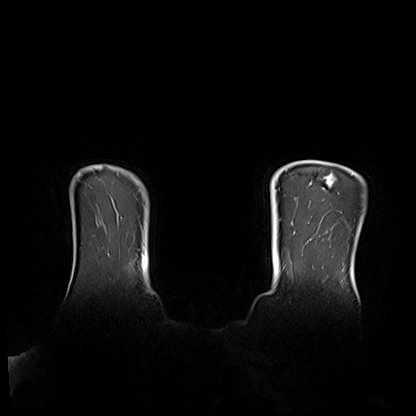
[im 117/117]
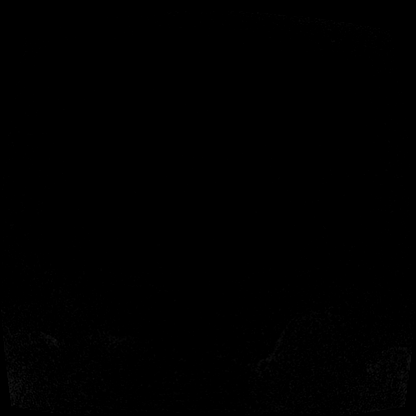

[Series 8: T1 fat-sat · axial · 1.6mm · 0.82mm/px · z∈[-70,+133]mm · 5 of 116 slices shown (3 of 4)]
[im 1/116]
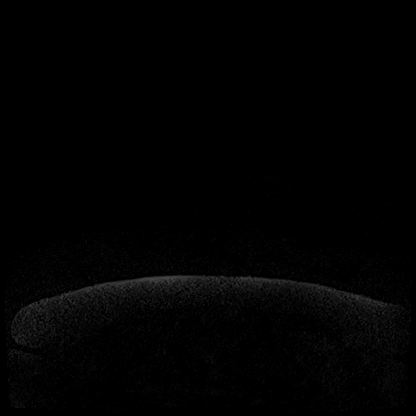
[im 29/116]
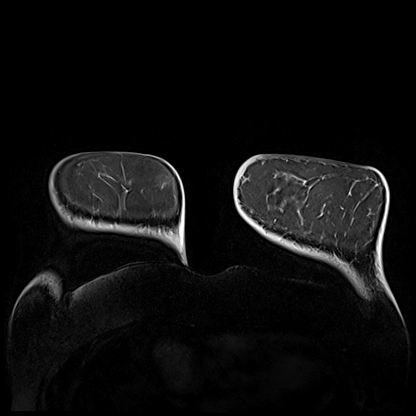
[im 58/116]
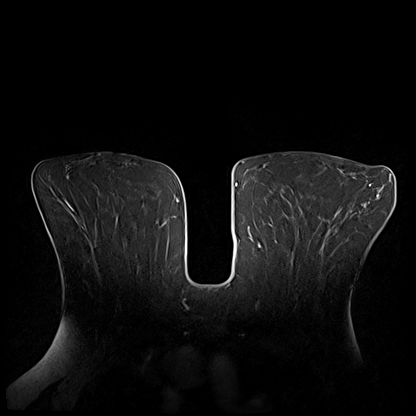
[im 87/116]
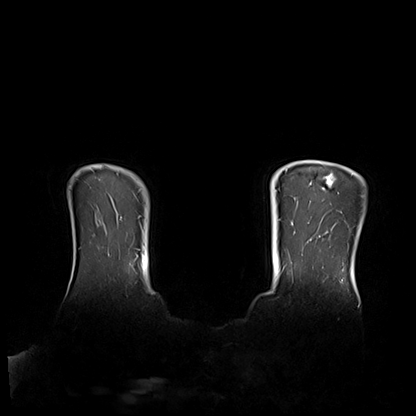
[im 116/116]
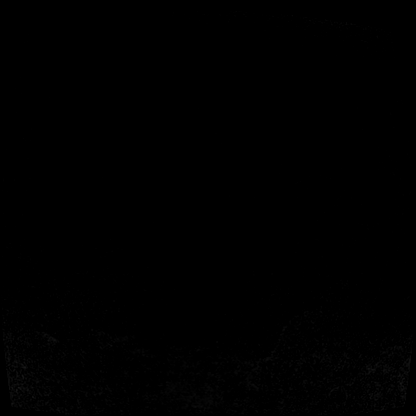

[Series 9: T1 · axial · 1.6mm · 0.82mm/px · z∈[-70,+133]mm · 6 of 128 slices shown (1 of 2)]
[im 1/128]
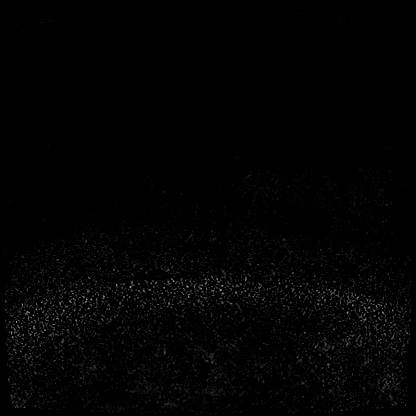
[im 26/128]
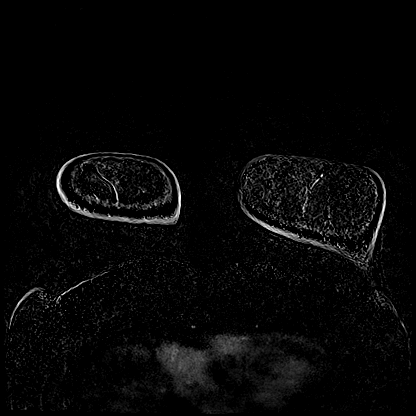
[im 51/128]
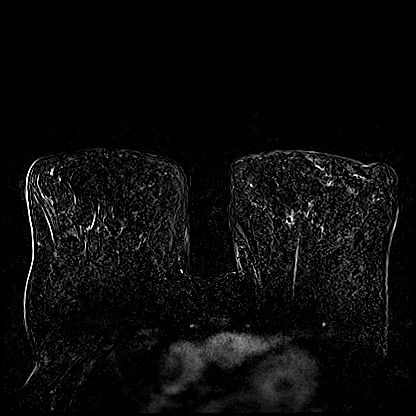
[im 77/128]
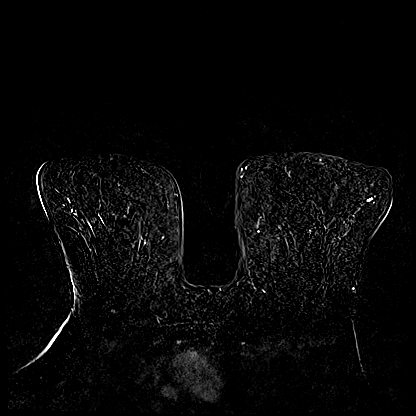
[im 102/128]
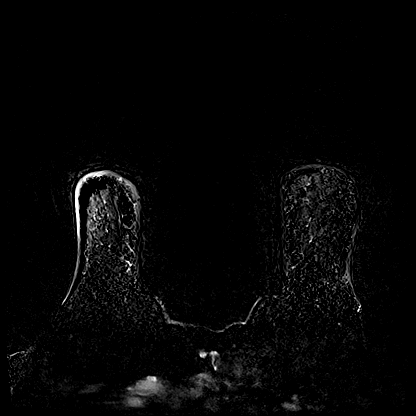
[im 128/128]
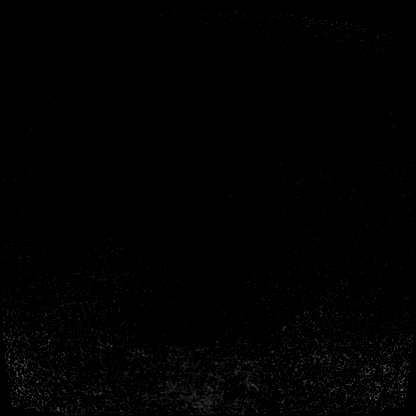

[Series 11: T1 · axial · 204.8mm · 0.82mm/px · 1 of 3 slices shown (2 of 2)]
[im 1/3]
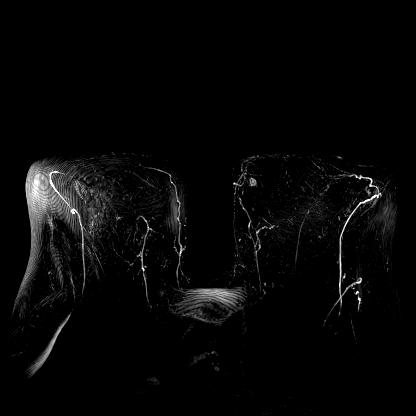

[Series 12: T1 fat-sat · axial · 1.6mm · 0.82mm/px · z∈[-70,+42]mm · 3 of 109 slices shown (4 of 4)]
[im 1/109]
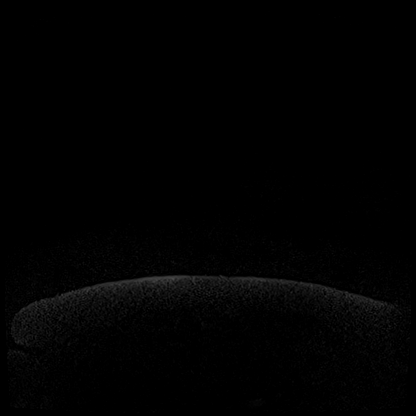
[im 28/109]
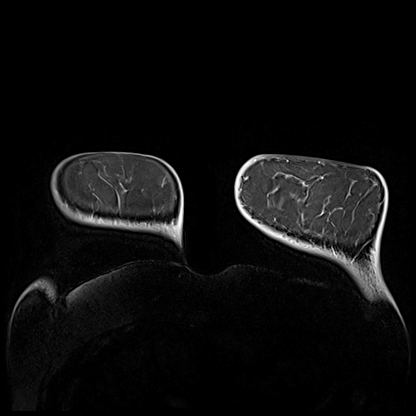
[im 55/109]
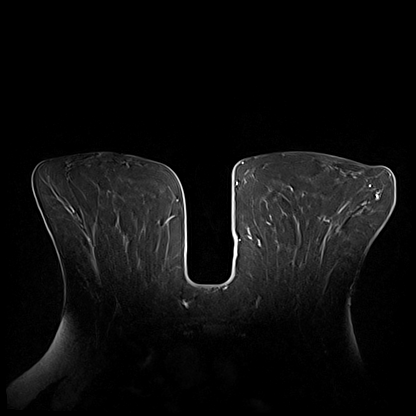

[29 of 48 positions shown; findings below may reference images not displayed]

Three-dimensional MR images were rendered by post-processing of the
original MR data on an independent workstation. The
three-dimensional MR images were interpreted, and findings are
reported in the following complete MRI report for this study. Three
dimensional images were evaluated at the independent [employer]
workstation
FINDINGS: Breast composition: b. Scattered fibroglandular tissue.

Background parenchymal enhancement: Mild.

Right breast: No mass or abnormal enhancement.

Left breast: Susceptibility artifact from 2 sites of previous left
breast biopsy are noted within the lateral left breast. There has
been complete interval resolution of the previously biopsied
dominant mass in the 3-4 o'clock position and multiple additional
enhancing masses throughout the upper-outer quadrant.

There has also been complete interval resolution of previously skin
skin thickening, enhancement and edema.

Lymph nodes: No abnormal appearing lymph nodes. Previously seen,
abnormal level 1 left axillary lymph nodes have resolved in the
interim.

Ancillary findings:  None.
IMPRESSION: Complete interval resolution of previously seen left breast masses,
skin thickening/enhancement and lymphadenopathy, consistent with
excellent treatment response.

RECOMMENDATION:
Per clinical treatment plan.

BI-RADS CATEGORY  6: Known biopsy-proven malignancy.

## 2020-01-05 MED ORDER — GADOBUTROL 1 MMOL/ML IV SOLN
8.0000 mL | Freq: Once | INTRAVENOUS | Status: AC | PRN
  Administered 2020-01-05: 14:00:00 8 mL via INTRAVENOUS

## 2020-01-07 ENCOUNTER — Telehealth: Payer: Self-pay | Admitting: Hematology and Oncology

## 2020-01-07 NOTE — Telephone Encounter (Signed)
Scheduled appt per 4/5 sch message -unable to reach pt . Left message with appt date and time

## 2020-01-12 ENCOUNTER — Encounter: Payer: Self-pay | Admitting: *Deleted

## 2020-01-12 ENCOUNTER — Other Ambulatory Visit: Payer: Self-pay | Admitting: General Surgery

## 2020-01-12 DIAGNOSIS — C50812 Malignant neoplasm of overlapping sites of left female breast: Secondary | ICD-10-CM | POA: Diagnosis not present

## 2020-01-12 DIAGNOSIS — Z7901 Long term (current) use of anticoagulants: Secondary | ICD-10-CM | POA: Diagnosis not present

## 2020-01-12 DIAGNOSIS — Z171 Estrogen receptor negative status [ER-]: Secondary | ICD-10-CM | POA: Diagnosis not present

## 2020-01-19 ENCOUNTER — Telehealth: Payer: Self-pay | Admitting: Cardiology

## 2020-01-19 ENCOUNTER — Other Ambulatory Visit: Payer: Self-pay | Admitting: Hematology and Oncology

## 2020-01-19 DIAGNOSIS — Z171 Estrogen receptor negative status [ER-]: Secondary | ICD-10-CM

## 2020-01-19 DIAGNOSIS — C50812 Malignant neoplasm of overlapping sites of left female breast: Secondary | ICD-10-CM

## 2020-01-19 NOTE — Telephone Encounter (Signed)
   [facility] Pre-operative Risk Assessment    Request for surgical clearance:  1. What type of surgery is being performed? Left breast mastectomy   2. When is this surgery scheduled? TBD   3. What type of clearance is required (medical clearance vs. Pharmacy clearance to hold med vs. Both)? Both   4. Are there any medications that need to be held prior to surgery and how long? Warfarin - Lovenox bridge?   5. Practice name and name of physician performing surgery? Stark Klein MD with [facility]   6. What is your office phone number [phone]    7.   What is your office fax number [phone] Attn: April Staton CMA  8.   Anesthesia type (None, local, MAC, general) ? General    Sheral Apley M 01/19/2020, 1:29 PM  _________________________________________________________________   (provider comments below)

## 2020-01-19 NOTE — Telephone Encounter (Signed)
Pt takes warfarin for recurrent DVT. Warfarin is managed by PCP Dr Regis Bill. Recommend bridging with Lovenox if pt needs to hold warfarin for 5 days prior to procedure. Bridge should be coordinated by PCP as they manage patient's anticoagulation.

## 2020-01-20 NOTE — Telephone Encounter (Signed)
   Primary Cardiologist: Kirk Ruths, MD  Chart reviewed as part of pre-operative protocol coverage. Patient was contacted 01/20/2020 in reference to pre-operative risk assessment for pending surgery as outlined below.  [name] Foster was last seen on 11/05/19 by Dr. Stanford Breed.  Since that day, Maryann Mccall has done well. Recent echo with normal EF. She can complete 4.0 METS without angina.    Per our clinical pharmacist: Pt takes warfarin for recurrent DVT. Warfarin is managed by PCP Dr Regis Bill. Recommend bridging with Lovenox if pt needs to hold warfarin for 5 days prior to procedure. Bridge should be coordinated by PCP as they manage patient's anticoagulation.   Therefore, based on ACC/AHA guidelines, the patient would be at acceptable risk for the planned procedure without further cardiovascular testing.   I will route this recommendation to the requesting party via Epic fax function and remove from pre-op pool.  Please call with questions.  Tami Lin Odyssey Vasbinder, PA 01/20/2020, 10:33 AM

## 2020-01-21 ENCOUNTER — Telehealth: Payer: Self-pay

## 2020-01-21 NOTE — Telephone Encounter (Signed)
April with Dr. Barry Dienes is calling to let us know that this patient needed Lovenox bridging and I let her know that Dr. Nicholas Lose has been checking INR and managing Coumadin. April will check with this office.

## 2020-01-22 ENCOUNTER — Encounter: Payer: Self-pay | Admitting: *Deleted

## 2020-01-22 ENCOUNTER — Other Ambulatory Visit: Payer: Self-pay

## 2020-01-22 DIAGNOSIS — R6889 Other general symptoms and signs: Secondary | ICD-10-CM | POA: Diagnosis not present

## 2020-01-22 DIAGNOSIS — C50812 Malignant neoplasm of overlapping sites of left female breast: Secondary | ICD-10-CM

## 2020-01-22 DIAGNOSIS — Z171 Estrogen receptor negative status [ER-]: Secondary | ICD-10-CM

## 2020-01-23 ENCOUNTER — Inpatient Hospital Stay: Payer: Medicare Other

## 2020-01-23 ENCOUNTER — Other Ambulatory Visit: Payer: Self-pay

## 2020-01-23 VITALS — BP 134/62 | HR 66 | Temp 98.2°F | Resp 18 | Wt 192.0 lb

## 2020-01-23 DIAGNOSIS — C50812 Malignant neoplasm of overlapping sites of left female breast: Secondary | ICD-10-CM

## 2020-01-23 DIAGNOSIS — Z171 Estrogen receptor negative status [ER-]: Secondary | ICD-10-CM

## 2020-01-23 DIAGNOSIS — Z5111 Encounter for antineoplastic chemotherapy: Secondary | ICD-10-CM | POA: Diagnosis not present

## 2020-01-23 DIAGNOSIS — Z5112 Encounter for antineoplastic immunotherapy: Secondary | ICD-10-CM | POA: Diagnosis not present

## 2020-01-23 DIAGNOSIS — Z7901 Long term (current) use of anticoagulants: Secondary | ICD-10-CM | POA: Diagnosis not present

## 2020-01-23 LAB — CBC WITH DIFFERENTIAL (CANCER CENTER ONLY)
Abs Immature Granulocytes: 0.11 10*3/uL — ABNORMAL HIGH (ref 0.00–0.07)
Basophils Absolute: 0 10*3/uL (ref 0.0–0.1)
Basophils Relative: 1 %
Eosinophils Absolute: 0.1 10*3/uL (ref 0.0–0.5)
Eosinophils Relative: 1 %
HCT: 32.1 % — ABNORMAL LOW (ref 36.0–46.0)
Hemoglobin: 9.5 g/dL — ABNORMAL LOW (ref 12.0–15.0)
Immature Granulocytes: 1 %
Lymphocytes Relative: 35 %
Lymphs Abs: 2.8 10*3/uL (ref 0.7–4.0)
MCH: 26.8 pg (ref 26.0–34.0)
MCHC: 29.6 g/dL — ABNORMAL LOW (ref 30.0–36.0)
MCV: 90.7 fL (ref 80.0–100.0)
Monocytes Absolute: 0.6 10*3/uL (ref 0.1–1.0)
Monocytes Relative: 7 %
Neutro Abs: 4.4 10*3/uL (ref 1.7–7.7)
Neutrophils Relative %: 55 %
Platelet Count: 282 10*3/uL (ref 150–400)
RBC: 3.54 MIL/uL — ABNORMAL LOW (ref 3.87–5.11)
RDW: 14.4 % (ref 11.5–15.5)
WBC Count: 8 10*3/uL (ref 4.0–10.5)
nRBC: 0 % (ref 0.0–0.2)

## 2020-01-23 LAB — CMP (CANCER CENTER ONLY)
ALT: 12 U/L (ref 0–44)
AST: 14 U/L — ABNORMAL LOW (ref 15–41)
Albumin: 3.3 g/dL — ABNORMAL LOW (ref 3.5–5.0)
Alkaline Phosphatase: 105 U/L (ref 38–126)
Anion gap: 9 (ref 5–15)
BUN: 14 mg/dL (ref 8–23)
CO2: 27 mmol/L (ref 22–32)
Calcium: 9.2 mg/dL (ref 8.9–10.3)
Chloride: 110 mmol/L (ref 98–111)
Creatinine: 1.77 mg/dL — ABNORMAL HIGH (ref 0.44–1.00)
GFR, Est AFR Am: 34 mL/min — ABNORMAL LOW (ref >60)
GFR, Estimated: 29 mL/min — ABNORMAL LOW (ref >60)
Glucose, Bld: 90 mg/dL (ref 70–99)
Potassium: 3.8 mmol/L (ref 3.5–5.1)
Sodium: 146 mmol/L — ABNORMAL HIGH (ref 135–145)
Total Bilirubin: 0.2 mg/dL — ABNORMAL LOW (ref 0.3–1.2)
Total Protein: 6.8 g/dL (ref 6.5–8.1)

## 2020-01-23 LAB — PROTIME-INR
INR: 1.5 — ABNORMAL HIGH (ref 0.8–1.2)
Prothrombin Time: 17.5 seconds — ABNORMAL HIGH (ref 11.4–15.2)

## 2020-01-23 MED ORDER — SODIUM CHLORIDE 0.9% FLUSH
10.0000 mL | INTRAVENOUS | Status: DC | PRN
  Administered 2020-01-23: 10 mL
  Filled 2020-01-23: qty 10

## 2020-01-23 MED ORDER — DIPHENHYDRAMINE HCL 25 MG PO CAPS
ORAL_CAPSULE | ORAL | Status: AC
  Filled 2020-01-23: qty 2

## 2020-01-23 MED ORDER — ACETAMINOPHEN 325 MG PO TABS
650.0000 mg | ORAL_TABLET | Freq: Once | ORAL | Status: AC
  Administered 2020-01-23: 650 mg via ORAL

## 2020-01-23 MED ORDER — HEPARIN SOD (PORK) LOCK FLUSH 100 UNIT/ML IV SOLN
500.0000 [IU] | Freq: Once | INTRAVENOUS | Status: AC | PRN
  Administered 2020-01-23: 500 [IU]
  Filled 2020-01-23: qty 5

## 2020-01-23 MED ORDER — SODIUM CHLORIDE 0.9 % IV SOLN
Freq: Once | INTRAVENOUS | Status: AC
  Filled 2020-01-23: qty 250

## 2020-01-23 MED ORDER — ACETAMINOPHEN 325 MG PO TABS
ORAL_TABLET | ORAL | Status: AC
  Filled 2020-01-23: qty 2

## 2020-01-23 MED ORDER — DIPHENHYDRAMINE HCL 25 MG PO CAPS
50.0000 mg | ORAL_CAPSULE | Freq: Once | ORAL | Status: AC
  Administered 2020-01-23: 13:00:00 50 mg via ORAL

## 2020-01-23 MED ORDER — TRASTUZUMAB-ANNS CHEMO 150 MG IV SOLR
6.0000 mg/kg | Freq: Once | INTRAVENOUS | Status: AC
  Administered 2020-01-23: 13:00:00 504 mg via INTRAVENOUS
  Filled 2020-01-23: qty 24

## 2020-01-23 NOTE — Patient Instructions (Signed)
[facility] °Discharge Instructions for Patients Receiving Chemotherapy ° °Today you received the following chemotherapy agents Trastuzumab ° °To help prevent nausea and vomiting after your treatment, we encourage you to take your nausea medication as directed. °  °If you develop nausea and vomiting that is not controlled by your nausea medication, call the clinic.  ° °BELOW ARE SYMPTOMS THAT SHOULD BE REPORTED IMMEDIATELY: °· *FEVER GREATER THAN 100.5 F °· *CHILLS WITH OR WITHOUT FEVER °· NAUSEA AND VOMITING THAT IS NOT CONTROLLED WITH YOUR NAUSEA MEDICATION °· *UNUSUAL SHORTNESS OF BREATH °· *UNUSUAL BRUISING OR BLEEDING °· TENDERNESS IN MOUTH AND THROAT WITH OR WITHOUT PRESENCE OF ULCERS °· *URINARY PROBLEMS °· *BOWEL PROBLEMS °· UNUSUAL RASH °Items with * indicate a potential emergency and should be followed up as soon as possible. ° °Feel free to call the clinic should you have any questions or concerns. The clinic phone number is [phone]. ° °Please show the CHEMO ALERT CARD at check-in to the Emergency Department and triage nurse. ° ° °

## 2020-01-29 ENCOUNTER — Telehealth: Payer: Self-pay | Admitting: General Practice

## 2020-01-29 ENCOUNTER — Telehealth: Payer: Self-pay

## 2020-01-29 NOTE — Telephone Encounter (Signed)
I was able to leave a VM on [phone]. I asked pt to call Villa Herb, RN @ [phone] as soon as possible.  The [phone] is unavailable, the mailbox is full.

## 2020-01-29 NOTE — Telephone Encounter (Signed)
RN placed call to inform patient regarding anticoagulation plan per MD prior to surgery on 6/8.   No answer, voicemail box full.   RN will attempt at another time.

## 2020-01-29 NOTE — Telephone Encounter (Signed)
LMOVM to call Villa Herb, RN @ [phone].

## 2020-02-03 ENCOUNTER — Telehealth: Payer: Self-pay

## 2020-02-03 ENCOUNTER — Encounter: Payer: Self-pay | Admitting: *Deleted

## 2020-02-03 NOTE — Telephone Encounter (Signed)
RN attempted call to patient at multiple numbers listed in chart.  Voicemail left for return call.

## 2020-02-06 ENCOUNTER — Telehealth: Payer: Self-pay

## 2020-02-06 NOTE — Telephone Encounter (Signed)
RN placed call [id number] bridge.  Awaiting return call.

## 2020-02-06 NOTE — Progress Notes (Signed)
MD and pharmacy reviewed need for Lovenox bridge for patient.  Pt is scheduled for surgery on 6/8.  Per Pharmacy and MD recommendations bridge is as follows:  6/2 - Pt to stop Coumadin  6/3 - Lovenox 80mg  SQ BID 6/4 - Lovenox 80mg  SQ BID  6/5 - Lovenox 80mg  SQ BID 6/6 - Lovenox 80mg  SQ BID  6/7 - Lovenox 80mg  SQ BID 6/8 - Surgery  6/9 - Resume Coumadin at previous dose, Lovenox 80mg  BID  6/10 - Coumadin, Lovenox 80mg  SQ BID 6/11 - Coumadin, Lovenox 80mg  SQ BID  6/12 - Coumadin, Lovenox 80mg  SQ BID  6/13 - Coumadin, Lovenox 80mg  SQ BID  6/14 - Coumadin, Lovenox 80mg  SQ BID Patient will need INR check on this date, 6/14 to ensure therapeutic level.

## 2020-02-09 NOTE — Progress Notes (Signed)
Pharmacist Chemotherapy Monitoring - Follow Up Assessment    I verify that I have reviewed each item in the below checklist:  . Regimen for the patient is scheduled for the appropriate day and plan matches scheduled date. Marland Kitchen Appropriate non-routine labs are ordered dependent on drug ordered. . If applicable, additional medications reviewed and ordered per protocol based on lifetime cumulative doses and/or treatment regimen.   Plan for follow-up and/or issues identified: No . I-vent associated with next due treatment: No . MD and/or nursing notified: No  Thien Berka D 02/09/2020 9:08 AM

## 2020-02-12 ENCOUNTER — Encounter: Payer: Self-pay | Admitting: *Deleted

## 2020-02-12 ENCOUNTER — Other Ambulatory Visit: Payer: Self-pay | Admitting: *Deleted

## 2020-02-12 DIAGNOSIS — Z171 Estrogen receptor negative status [ER-]: Secondary | ICD-10-CM

## 2020-02-12 NOTE — Progress Notes (Signed)
Patient Care Team: Panosh, Standley Brooking, MD as PCP - General Stanford Breed, Denice Bors, MD as PCP - Cardiology (Cardiology) Stanford Breed Denice Bors, MD (Cardiology) Susa Day, MD (Orthopedic Surgery) Mauro Kaufmann, RN as Oncology Nurse Navigator Rockwell Germany, RN as Oncology Nurse Navigator  DIAGNOSIS:    ICD-10-CM   1. Malignant neoplasm of overlapping sites of left breast in female, estrogen receptor negative ([place])  C50.812    Z17.1     SUMMARY OF ONCOLOGIC HISTORY: Oncology History  Malignant neoplasm of overlapping sites of left breast in female, estrogen receptor negative ([place])  08/01/2019 Initial Diagnosis   Patient palpated left breast lump and skin thickening. Mammogram showed a 3.6cm mass at 3:30 position, a 0.9cm mass at 3:00 position, a 0.7cm mass at 2:00 position, a 0.5cm mass at 2:00 position, a 0.3cm mass at 1:00 position, and a 0.4cm mass at 1:00 position, with 4 abnormal left axillary lymph nodes. Biopsy showed IDC, grade 3, HER-2 + (3+), ER/PR -, Ki67 70%, in the breast and lymph nodes.    08/06/2019 Cancer Staging   Staging form: Breast, AJCC 8th Edition - Clinical: Stage IIIB (cT4, cN1, cM0, G3, ER-, PR-, HER2+) - Signed by Nicholas Lose, MD on 08/06/2019   08/19/2019 - 01/22/2020 Chemotherapy   The patient had palonosetron (ALOXI) injection 0.25 mg, 0.25 mg, Intravenous,  Once, 6 of 6 cycles Administration: 0.25 mg (08/19/2019), 0.25 mg (09/08/2019), 0.25 mg (12/12/2019), 0.25 mg (01/02/2020), 0.25 mg (10/31/2019), 0.25 mg (11/22/2019) pegfilgrastim-cbqv (UDENYCA) injection 6 mg, 6 mg, Subcutaneous, Once, 2 of 2 cycles Administration: 6 mg (08/21/2019), 6 mg (09/10/2019) CARBOplatin (PARAPLATIN) 480 mg in sodium chloride 0.9 % 250 mL chemo infusion, 480 mg (110.9 % of original dose 429.6 mg), Intravenous,  Once, 2 of 2 cycles Dose modification:   (original dose 429.6 mg, Cycle 1) Administration: 480 mg (08/19/2019), 340 mg (09/08/2019) DOCEtaxel (TAXOTERE) 160 mg in sodium  chloride 0.9 % 250 mL chemo infusion, 75 mg/m2 = 160 mg, Intravenous,  Once, 6 of 6 cycles Dose modification: 60 mg/m2 (original dose 75 mg/m2, Cycle 2, Reason: Dose not tolerated), 50 mg/m2 (original dose 75 mg/m2, Cycle 5, Reason: Dose not tolerated) Administration: 160 mg (08/19/2019), 130 mg (09/08/2019), 110 mg (12/12/2019), 110 mg (01/02/2020), 110 mg (10/31/2019), 110 mg (11/22/2019) pertuzumab (PERJETA) 420 mg in sodium chloride 0.9 % 250 mL chemo infusion, 420 mg (100 % of original dose 420 mg), Intravenous, Once, 2 of 2 cycles Dose modification: 420 mg (original dose 420 mg, Cycle 1, Reason: Provider Judgment) Administration: 420 mg (08/19/2019), 420 mg (09/08/2019) fosaprepitant (EMEND) 150 mg, dexamethasone (DECADRON) 12 mg in sodium chloride 0.9 % 145 mL IVPB, , Intravenous,  Once, 3 of 3 cycles Administration:  (08/19/2019),  (09/08/2019),  (10/31/2019) trastuzumab-anns (KANJINTI) 750 mg in sodium chloride 0.9 % 250 mL chemo infusion, 756 mg (100 % of original dose 8 mg/kg), Intravenous,  Once, 6 of 6 cycles Dose modification: 8 mg/kg (original dose 8 mg/kg, Cycle 1, Reason: Other (see comments), Comment: change to approved brand), 6 mg/kg (original dose 6 mg/kg, Cycle 2, Reason: Other (see comments), Comment: brand change per insurance) Administration: 750 mg (08/19/2019), 567 mg (09/08/2019), 567 mg (10/31/2019), 567 mg (11/22/2019), 567 mg (12/12/2019), 504 mg (01/02/2020)  for chemotherapy treatment.    09/21/2019 - 10/03/2019 Hospital Admission   Intractable nausea and vomiting   01/23/2020 -  Chemotherapy   The patient had trastuzumab-anns (KANJINTI) 504 mg in sodium chloride 0.9 % 250 mL chemo infusion, 6 mg/kg =  504 mg (100 % of original dose 6 mg/kg), Intravenous,  Once, 1 of 13 cycles Dose modification: 6 mg/kg (original dose 6 mg/kg, Cycle 1, Reason: Other (see comments), Comment: biosimilar conversion; auth'd for Kanjinti) Administration: 504 mg (01/23/2020)  for chemotherapy treatment.       CHIEF COMPLIANT: Follow-up of left breast cancer   INTERVAL HISTORY: [name] Foster is a 69 y.o. with above-mentioned history of left breast cancer.She completed neoadjuvant chemotherapywith TCHP. Breast MRI on 01/05/20 showed complete resolution of the previously seen left breast masses and lymphadenopathy. She presents to the clinic todayto discuss her anticoagulation plan prior to surgery.   ALLERGIES:  is allergic to penicillins; tetanus toxoid; and aspirin.  MEDICATIONS:  Current Outpatient Medications  Medication Sig Dispense Refill  . Albuterol Sulfate (PROAIR RESPICLICK) 779 (90 Base) MCG/ACT AEPB Inhale 2 puffs into the lungs every 6 (six) hours as needed. (Patient taking differently: Inhale 2 puffs into the lungs every 6 (six) hours as needed (SOB / wheezing). ) 2 each 1  . amLODipine (NORVASC) 10 MG tablet TAKE 1 TABLET BY MOUTH  DAILY (Patient taking differently: Take 10 mg by mouth daily. ) 90 tablet 3  . atorvastatin (LIPITOR) 20 MG tablet TAKE 1 TABLET BY MOUTH  DAILY (Patient taking differently: Take 20 mg by mouth daily at 6 PM. ) 90 tablet 1  . Carboxymethylcellul-Glycerin (CLEAR EYES FOR DRY EYES) 1-0.25 % SOLN Place 1 drop into both eyes 2 (two) times daily as needed (dry eyes).    . Cholecalciferol (VITAMIN D) 50 MCG (2000 UT) tablet Take 2,000 Units by mouth daily.    . fexofenadine (ALLEGRA) 180 MG tablet Take 180 mg by mouth daily as needed for allergies.     . fluticasone (FLONASE) 50 MCG/ACT nasal spray USE 2 SPRAYS IN EACH NOSTRIL DAILY. PT NEEDS TO SCHEDULE A FOLLOW UP APPT BEFORE NEXT REFILL. (Patient taking differently: Place 2 sprays into both nostrils daily as needed for allergies. ) 16 g 0  . furosemide (LASIX) 40 MG tablet TAKE 1 TABLET BY MOUTH  DAILY (Patient taking differently: Take 40 mg by mouth daily. ) 90 tablet 1  . hydrALAZINE (APRESOLINE) 50 MG tablet Take 0.5 tablets (25 mg total) by mouth 3 (three) times daily. (Patient taking differently: Take 25  mg by mouth in the morning and at bedtime. ) 1 tablet 0  . metoprolol tartrate (LOPRESSOR) 50 MG tablet TAKE 1 TABLET BY MOUTH  TWICE DAILY (Patient taking differently: Take 50 mg by mouth 2 (two) times daily. ) 180 tablet 1  . potassium chloride SA (KLOR-CON) 20 MEQ tablet TAKE 1 TABLET BY MOUTH  DAILY (Patient taking differently: Take 20 mEq by mouth daily. ) 90 tablet 3  . sodium chloride (OCEAN) 0.65 % SOLN nasal spray Place 1 spray into both nostrils as needed for congestion.    Marland Kitchen warfarin (COUMADIN) 5 MG tablet Take 1 tablet daily or as directed by anticoagulation clinic. (Patient taking differently: Take 2.5-5 mg by mouth See admin instructions. Take 1 tablet daily or as directed by anticoagulation clinic; 5 mg all days except taking 2.5 mg on Wed) 100 tablet 1   No current facility-administered medications for this visit.    PHYSICAL EXAMINATION: ECOG PERFORMANCE STATUS: 1 - Symptomatic but completely ambulatory  Vitals:   02/13/20 1108  BP: 137/67  Pulse: (!) 56  Resp: 20  Temp: 98.5 F (36.9 C)  SpO2: 100%   Filed Weights   02/13/20 1108  Weight: 193 lb  6.4 oz (87.7 kg)    LABORATORY DATA:  I have reviewed the data as listed CMP Latest Ref Rng & Units 02/13/2020 01/23/2020 01/02/2020  Glucose 70 - 99 mg/dL 90 90 122(H)  BUN 8 - 23 mg/dL '16 14 12  ' Creatinine 0.44 - 1.00 mg/dL 1.86(H) 1.77(H) 1.99(H)  Sodium 135 - 145 mmol/L 146(H) 146(H) 147(H)  Potassium 3.5 - 5.1 mmol/L 3.8 3.8 3.8  Chloride 98 - 111 mmol/L 110 110 110  CO2 22 - 32 mmol/L '26 27 26  ' Calcium 8.9 - 10.3 mg/dL 8.9 9.2 9.2  Total Protein 6.5 - 8.1 g/dL 6.6 6.8 6.8  Total Bilirubin 0.3 - 1.2 mg/dL 0.3 0.2(L) <0.2(L)  Alkaline Phos 38 - 126 U/L 100 105 101  AST 15 - 41 U/L 22 14(L) 12(L)  ALT 0 - 44 U/L '17 12 7    ' Lab Results  Component Value Date   WBC 7.9 02/13/2020   HGB 10.0 (L) 02/13/2020   HCT 32.6 (L) 02/13/2020   MCV 90.1 02/13/2020   PLT 234 02/13/2020   NEUTROABS 4.8 02/13/2020     ASSESSMENT & PLAN:  Malignant neoplasm of overlapping sites of left breast in female, estrogen receptor negative ([place]) 08/01/2019:Patient palpated left breast lump and skin thickening. Mammogram showed a 3.6cm mass at 3:30 position, a 0.9cm mass at 3:00 position, a 0.7cm mass at 2:00 position, a 0.5cm mass at 2:00 position, a 0.3cm mass at 1:00 position, and a 0.4cm mass at 1:00 position, with 4 abnormal left axillary lymph nodes. Biopsy showed IDC, grade 3, HER-2 + (3+), ER/PR -, Ki67 70%, in the breast and lymph nodes.  Treatment plan 1. Neoadjuvant chemotherapy with TCH Perjeta 6 cycles followed by Herceptinmaintenance versus Kadcyla maintenance (depending on response to chemo)for 1 year, Completed 01/02/2020 2. Followed bymastectomy withaxillary lymph node dissection 3. Followed by adjuvant radiation therapy  CT CAP 08/12/2019: 3.9 cm left breast mass with left axillary lymph nodes. 10 mm lesion upper pole of left kidney suspicious for small renal cell cancer. 3 mm left lower lobe nodule, 4 mm subcapsular lesion anterior hepatic dome too small Bone scan 08/15/2019: Benign Breast MRI 08/12/2019: Locally advanced left breast cancer 4 cm, multiple irregular enhancing masses, diffuse skin thickening and enhancement and multiple pathogenic level 1 axillary lymph nodes at least 5-6 Breast MRI post neoadjuvant chemo: Complete imaging response in the tumor and the lymph node -------------------------------------------------------------------------------------------------------------------------------------------------- Current treatment:Herceptin maintenance Chemo toxicities: 1. Hospitalization after second cycle of chemo 09/21/2019:Forintractable nausea and vomiting:Carboplatin was discontinued and dose of Taxotere reduced. 2. Diarrhea and dehydration:No further issues with diarrhea 3. Loss of taste and appetite:Same as before 4. Profound fatigue: Stable 5  .Chemotherapy-induced anemia: Today's hemoglobin is10  INR: 1.8: On Coumadin 5 mg daily Chronic renal failure:Being monitored closely.Okay to treat    We will see her post surgery to discuss the final pathology results.    No orders of the defined types were placed in this encounter.  The patient has a good understanding of the overall plan. she agrees with it. she will call with any problems that may develop before the next visit here.  Total time spent: 20 mins including face to face time and time spent for planning, charting and coordination of care  Nicholas Lose, MD 02/13/2020  I, Cloyde Reams Dorshimer, am acting as scribe for Dr. Nicholas Lose.  I have reviewed the above documentation for accuracy and completeness, and I agree with the above.

## 2020-02-13 ENCOUNTER — Other Ambulatory Visit: Payer: Self-pay

## 2020-02-13 ENCOUNTER — Other Ambulatory Visit: Payer: Self-pay | Admitting: *Deleted

## 2020-02-13 ENCOUNTER — Inpatient Hospital Stay: Payer: Medicare Other | Attending: Hematology and Oncology

## 2020-02-13 ENCOUNTER — Inpatient Hospital Stay: Payer: Medicare Other

## 2020-02-13 ENCOUNTER — Inpatient Hospital Stay (HOSPITAL_BASED_OUTPATIENT_CLINIC_OR_DEPARTMENT_OTHER): Payer: Medicare Other | Admitting: Hematology and Oncology

## 2020-02-13 DIAGNOSIS — Z171 Estrogen receptor negative status [ER-]: Secondary | ICD-10-CM | POA: Diagnosis not present

## 2020-02-13 DIAGNOSIS — Z86718 Personal history of other venous thrombosis and embolism: Secondary | ICD-10-CM

## 2020-02-13 DIAGNOSIS — Z5112 Encounter for antineoplastic immunotherapy: Secondary | ICD-10-CM | POA: Diagnosis not present

## 2020-02-13 DIAGNOSIS — Z95828 Presence of other vascular implants and grafts: Secondary | ICD-10-CM

## 2020-02-13 DIAGNOSIS — Z7901 Long term (current) use of anticoagulants: Secondary | ICD-10-CM | POA: Diagnosis not present

## 2020-02-13 DIAGNOSIS — C50812 Malignant neoplasm of overlapping sites of left female breast: Secondary | ICD-10-CM | POA: Insufficient documentation

## 2020-02-13 LAB — CBC WITH DIFFERENTIAL (CANCER CENTER ONLY)
Abs Immature Granulocytes: 0.02 10*3/uL (ref 0.00–0.07)
Basophils Absolute: 0 10*3/uL (ref 0.0–0.1)
Basophils Relative: 0 %
Eosinophils Absolute: 0.3 10*3/uL (ref 0.0–0.5)
Eosinophils Relative: 4 %
HCT: 32.6 % — ABNORMAL LOW (ref 36.0–46.0)
Hemoglobin: 10 g/dL — ABNORMAL LOW (ref 12.0–15.0)
Immature Granulocytes: 0 %
Lymphocytes Relative: 29 %
Lymphs Abs: 2.3 10*3/uL (ref 0.7–4.0)
MCH: 27.6 pg (ref 26.0–34.0)
MCHC: 30.7 g/dL (ref 30.0–36.0)
MCV: 90.1 fL (ref 80.0–100.0)
Monocytes Absolute: 0.5 10*3/uL (ref 0.1–1.0)
Monocytes Relative: 6 %
Neutro Abs: 4.8 10*3/uL (ref 1.7–7.7)
Neutrophils Relative %: 61 %
Platelet Count: 234 10*3/uL (ref 150–400)
RBC: 3.62 MIL/uL — ABNORMAL LOW (ref 3.87–5.11)
RDW: 15.9 % — ABNORMAL HIGH (ref 11.5–15.5)
WBC Count: 7.9 10*3/uL (ref 4.0–10.5)
nRBC: 0 % (ref 0.0–0.2)

## 2020-02-13 LAB — CMP (CANCER CENTER ONLY)
ALT: 17 U/L (ref 0–44)
AST: 22 U/L (ref 15–41)
Albumin: 3.5 g/dL (ref 3.5–5.0)
Alkaline Phosphatase: 100 U/L (ref 38–126)
Anion gap: 10 (ref 5–15)
BUN: 16 mg/dL (ref 8–23)
CO2: 26 mmol/L (ref 22–32)
Calcium: 8.9 mg/dL (ref 8.9–10.3)
Chloride: 110 mmol/L (ref 98–111)
Creatinine: 1.86 mg/dL — ABNORMAL HIGH (ref 0.44–1.00)
GFR, Est AFR Am: 32 mL/min — ABNORMAL LOW (ref >60)
GFR, Estimated: 28 mL/min — ABNORMAL LOW (ref >60)
Glucose, Bld: 90 mg/dL (ref 70–99)
Potassium: 3.8 mmol/L (ref 3.5–5.1)
Sodium: 146 mmol/L — ABNORMAL HIGH (ref 135–145)
Total Bilirubin: 0.3 mg/dL (ref 0.3–1.2)
Total Protein: 6.6 g/dL (ref 6.5–8.1)

## 2020-02-13 LAB — PROTIME-INR
INR: 1.8 — ABNORMAL HIGH (ref 0.8–1.2)
Prothrombin Time: 20.4 seconds — ABNORMAL HIGH (ref 11.4–15.2)

## 2020-02-13 MED ORDER — ACETAMINOPHEN 325 MG PO TABS
650.0000 mg | ORAL_TABLET | Freq: Once | ORAL | Status: AC
  Administered 2020-02-13: 650 mg via ORAL

## 2020-02-13 MED ORDER — ALTEPLASE 2 MG IJ SOLR
2.0000 mg | Freq: Once | INTRAMUSCULAR | Status: DC | PRN
  Filled 2020-02-13: qty 2

## 2020-02-13 MED ORDER — SODIUM CHLORIDE 0.9% FLUSH
10.0000 mL | INTRAVENOUS | Status: DC | PRN
  Administered 2020-02-13: 10 mL
  Filled 2020-02-13: qty 10

## 2020-02-13 MED ORDER — DIPHENHYDRAMINE HCL 25 MG PO CAPS
ORAL_CAPSULE | ORAL | Status: AC
  Filled 2020-02-13: qty 2

## 2020-02-13 MED ORDER — DIPHENHYDRAMINE HCL 25 MG PO CAPS
50.0000 mg | ORAL_CAPSULE | Freq: Once | ORAL | Status: AC
  Administered 2020-02-13: 50 mg via ORAL

## 2020-02-13 MED ORDER — HEPARIN SOD (PORK) LOCK FLUSH 100 UNIT/ML IV SOLN
500.0000 [IU] | Freq: Once | INTRAVENOUS | Status: DC | PRN
  Filled 2020-02-13: qty 5

## 2020-02-13 MED ORDER — HEPARIN SOD (PORK) LOCK FLUSH 100 UNIT/ML IV SOLN
500.0000 [IU] | Freq: Once | INTRAVENOUS | Status: AC | PRN
  Administered 2020-02-13: 500 [IU]
  Filled 2020-02-13: qty 5

## 2020-02-13 MED ORDER — ACETAMINOPHEN 325 MG PO TABS
ORAL_TABLET | ORAL | Status: AC
  Filled 2020-02-13: qty 2

## 2020-02-13 MED ORDER — SODIUM CHLORIDE 0.9% FLUSH
10.0000 mL | INTRAVENOUS | Status: DC | PRN
  Filled 2020-02-13: qty 10

## 2020-02-13 MED ORDER — ENOXAPARIN SODIUM 80 MG/0.8ML ~~LOC~~ SOLN: 80.0000 mg | Freq: Two times a day (BID) | SUBCUTANEOUS | 0 refills | Status: DC

## 2020-02-13 MED ORDER — TRASTUZUMAB-ANNS CHEMO 150 MG IV SOLR
6.0000 mg/kg | Freq: Once | INTRAVENOUS | Status: AC
  Administered 2020-02-13: 504 mg via INTRAVENOUS
  Filled 2020-02-13: qty 24

## 2020-02-13 MED ORDER — SODIUM CHLORIDE 0.9 % IV SOLN
Freq: Once | INTRAVENOUS | Status: AC
  Filled 2020-02-13: qty 250

## 2020-02-13 NOTE — Patient Instructions (Signed)
[facility] °Discharge Instructions for Patients Receiving Chemotherapy ° °Today you received the following chemotherapy agents Trastuzumab ° °To help prevent nausea and vomiting after your treatment, we encourage you to take your nausea medication as directed. °  °If you develop nausea and vomiting that is not controlled by your nausea medication, call the clinic.  ° °BELOW ARE SYMPTOMS THAT SHOULD BE REPORTED IMMEDIATELY: °· *FEVER GREATER THAN 100.5 F °· *CHILLS WITH OR WITHOUT FEVER °· NAUSEA AND VOMITING THAT IS NOT CONTROLLED WITH YOUR NAUSEA MEDICATION °· *UNUSUAL SHORTNESS OF BREATH °· *UNUSUAL BRUISING OR BLEEDING °· TENDERNESS IN MOUTH AND THROAT WITH OR WITHOUT PRESENCE OF ULCERS °· *URINARY PROBLEMS °· *BOWEL PROBLEMS °· UNUSUAL RASH °Items with * indicate a potential emergency and should be followed up as soon as possible. ° °Feel free to call the clinic should you have any questions or concerns. The clinic phone number is [phone]. ° °Please show the CHEMO ALERT CARD at check-in to the Emergency Department and triage nurse. ° ° °

## 2020-02-13 NOTE — Assessment & Plan Note (Signed)
08/01/2019:Patient palpated left breast lump and skin thickening. Mammogram showed a 3.6cm mass at 3:30 position, a 0.9cm mass at 3:00 position, a 0.7cm mass at 2:00 position, a 0.5cm mass at 2:00 position, a 0.3cm mass at 1:00 position, and a 0.4cm mass at 1:00 position, with 4 abnormal left axillary lymph nodes. Biopsy showed IDC, grade 3, HER-2 + (3+), ER/PR -, Ki67 70%, in the breast and lymph nodes.  Treatment plan 1. Neoadjuvant chemotherapy with TCH Perjeta 6 cycles followed by HerceptinPerjetamaintenance versus Kadcyla maintenance (depending on response to chemo)for 1 year 2. Followed bymastectomy withaxillary lymph node dissection 3. Followed by adjuvant radiation therapy  CT CAP 08/12/2019: 3.9 cm left breast mass with left axillary lymph nodes. 10 mm lesion upper pole of left kidney suspicious for small renal cell cancer. 3 mm left lower lobe nodule, 4 mm subcapsular lesion anterior hepatic dome too small Bone scan 08/15/2019: Benign Breast MRI 08/12/2019: Locally advanced left breast cancer 4 cm, multiple irregular enhancing masses, diffuse skin thickening and enhancement and multiple pathogenic level 1 axillary lymph nodes at least 5-6 -------------------------------------------------------------------------------------------------------------------------------------------------- Current treatment:Cycle6TCH Perjeta(we will discontinue Perjeta and carboplatin and reducedthe dosage of Taxotere) Chemo toxicities: 1. Hospitalization after second cycle of chemo 09/21/2019:Forintractable nausea and vomiting:Carboplatin was discontinued and dose of Taxotere reduced. 2. Diarrhea and dehydration:No further issues with diarrhea 3. Loss of taste and appetite:Same as before 4. Profound fatigue: Stable 5 .Chemotherapy-induced anemia: Today's hemoglobin is9.4  INR: 1.5, I encouraged her to increase the dosage of Coumadin to 7.5 mg on one of the 7 days. The rest of  the days she gets 5 mg. Chronic renal failure:Being monitored closely.Okay to treat Patient has been set up for post neoadjuvant breast MRI and surgery appointments.  We will see her post surgery to discuss adjuvant Herceptin Perjeta versus Kadcyla.

## 2020-02-24 NOTE — Pre-Procedure Instructions (Signed)
Your procedure is scheduled on Tuesday, March 09, 2020 from 09:00 AM- 12:00 PM.  Report to [facility] Main Entrance "A" at 07:00 A.M., and check in at the Admitting office.  Call this number if you have problems the morning of surgery:  [phone]  Call [phone] if you have any questions prior to your surgery date Monday-Friday 8am-4pm.    Remember:  Do not eat after midnight the night before your surgery.  You may drink clear liquids until 06:00 AM the morning of your surgery.   Clear liquids allowed are: Water, Non-Citrus Juices (without pulp), Carbonated Beverages, Clear Tea, Black Coffee Only, and Gatorade.  . The day of surgery (if you do NOT have diabetes):  o Drink ONE (1) Pre-Surgery Clear Ensure by 06:00 AM.   o This drink was given to you during your hospital  pre-op appointment visit. o Nothing else to drink after completing the  Pre-Surgery Clear Ensure.     Take these medicines the morning of surgery with A SIP OF WATER: amLODipine (NORVASC) hydrALAZINE (APRESOLINE) metoprolol tartrate (LOPRESSOR)    IF NEEDED: fexofenadine (ALLEGRA) fluticasone (FLONASE) nasal spray sodium chloride (OCEAN) nasal spray Carboxymethylcellul-Glycerin (CLEAR EYES FOR DRY EYES) eye drops Albuterol Sulfate (PROAIR RESPICLICK) inhaler  Please bring all inhalers with you the day of surgery.   *Follow your surgeon's instructions for Coumadin and Lovenox:   6/2 - Stop Coumadin  6/3 - Lovenox twice daily 6/4 - Lovenox twice daily 6/5 - Lovenox twice daily 6/6 - Lovenox twice daily 6/7 - Lovenox twice daily 6/8 - Surgery  6/9 - Resume Coumadin at previous dose, Lovenox twice daily.  6/10 - Coumadin; Lovenox twice daily 6/11 - Coumadin; Lovenox twice daily  6/12 - Coumadin; Lovenox twice daily 6/13 - Coumadin; Lovenox twice daily 6/14 - Coumadin; Lovenox twice daily. Patient will need INR check on this date, 6/14 to ensure therapeutic level.    As of today, STOP taking any  Aspirin (unless otherwise instructed by your surgeon) and Aspirin containing products, Aleve, Naproxen, Ibuprofen, Motrin, Advil, Goody's, BC's, all herbal medications, fish oil, and all vitamins.       The Morning of Surgery:               Do not wear jewelry, make up, or nail polish.            Do not wear lotions, powders, perfumes, or deodorant.            Do not shave 48 hours prior to surgery.              Do not bring valuables to the hospital.            [facility] is not responsible for any belongings or valuables.  Do NOT Smoke (Tobacco/Vapping) or drink Alcohol 24 hours prior to your procedure. If you use a CPAP at night, you may bring all equipment for your overnight stay.   Contacts, glasses, dentures or bridgework may not be worn into surgery.      For patients admitted to the hospital, discharge time will be determined by your treatment team.   Patients discharged the day of surgery will not be allowed to drive home, and someone needs to stay with them for 24 hours.    Special instructions:   [place]- Preparing For Surgery  Before surgery, you can play an important role. Because skin is not sterile, your skin needs to be as free of germs as possible. You  can reduce the number of germs on your skin by washing with CHG (chlorahexidine gluconate) Soap before surgery.  CHG is an antiseptic cleaner which kills germs and bonds with the skin to continue killing germs even after washing.    Oral Hygiene is also important to reduce your risk of infection.  Remember - BRUSH YOUR TEETH THE MORNING OF SURGERY WITH YOUR REGULAR TOOTHPASTE  Please do not use if you have an allergy to CHG or antibacterial soaps. If your skin becomes reddened/irritated stop using the CHG.  Do not shave (including legs and underarms) for at least 48 hours prior to first CHG shower. It is OK to shave your face.  Please follow these instructions carefully.   1. Shower the NIGHT BEFORE SURGERY and the  MORNING OF SURGERY with CHG Soap.   2. If you chose to wash your hair, wash your hair first as usual with your normal shampoo.  3. After you shampoo, rinse your hair and body thoroughly to remove the shampoo.  4. Use CHG as you would any other liquid soap. You can apply CHG directly to the skin and wash gently with a scrungie or a clean washcloth.   5. Apply the CHG Soap to your body ONLY FROM THE NECK DOWN.  Do not use on open wounds or open sores. Avoid contact with your eyes, ears, mouth and genitals (private parts). Wash Face and genitals (private parts)  with your normal soap.   6. Wash thoroughly, paying special attention to the area where your surgery will be performed.  7. Thoroughly rinse your body with warm water from the neck down.  8. DO NOT shower/wash with your normal soap after using and rinsing off the CHG Soap.  9. Pat yourself dry with a CLEAN TOWEL.  10. Wear CLEAN PAJAMAS to bed the night before surgery, wear comfortable clothes the morning of surgery  11. Place CLEAN SHEETS on your bed the night of your first shower and DO NOT SLEEP WITH PETS.   Day of Surgery:   Do not apply any deodorants/lotions.  Please wear clean clothes to the hospital/surgery center.   Remember to brush your teeth WITH YOUR REGULAR TOOTHPASTE.   Please read over the following fact sheets that you were given.

## 2020-02-25 ENCOUNTER — Encounter (HOSPITAL_COMMUNITY)
Admission: RE | Admit: 2020-02-25 | Discharge: 2020-02-25 | Disposition: A | Discharge status: Home / Self Care | Payer: Medicare Other | Source: Ambulatory Visit | Attending: General Surgery | Admitting: General Surgery

## 2020-02-25 ENCOUNTER — Encounter (HOSPITAL_COMMUNITY): Payer: Self-pay

## 2020-02-25 ENCOUNTER — Other Ambulatory Visit: Payer: Self-pay

## 2020-02-25 DIAGNOSIS — Z01812 Encounter for preprocedural laboratory examination: Secondary | ICD-10-CM | POA: Insufficient documentation

## 2020-02-25 HISTORY — DX: Anemia, unspecified: D64.9

## 2020-02-25 HISTORY — DX: Peripheral vascular disease, unspecified: I73.9

## 2020-02-25 LAB — BASIC METABOLIC PANEL
Anion gap: 9 (ref 5–15)
BUN: 17 mg/dL (ref 8–23)
CO2: 28 mmol/L (ref 22–32)
Calcium: 9.1 mg/dL (ref 8.9–10.3)
Chloride: 107 mmol/L (ref 98–111)
Creatinine, Ser: 1.78 mg/dL — ABNORMAL HIGH (ref 0.44–1.00)
GFR calc Af Amer: 34 mL/min — ABNORMAL LOW (ref >60)
GFR calc non Af Amer: 29 mL/min — ABNORMAL LOW (ref >60)
Glucose, Bld: 93 mg/dL (ref 70–99)
Potassium: 3.6 mmol/L (ref 3.5–5.1)
Sodium: 144 mmol/L (ref 135–145)

## 2020-02-25 LAB — PROTIME-INR
INR: 2.2 — ABNORMAL HIGH (ref 0.8–1.2)
Prothrombin Time: 23.7 seconds — ABNORMAL HIGH (ref 11.4–15.2)

## 2020-02-25 LAB — CBC
HCT: 34.5 % — ABNORMAL LOW (ref 36.0–46.0)
Hemoglobin: 10.2 g/dL — ABNORMAL LOW (ref 12.0–15.0)
MCH: 27.1 pg (ref 26.0–34.0)
MCHC: 29.6 g/dL — ABNORMAL LOW (ref 30.0–36.0)
MCV: 91.5 fL (ref 80.0–100.0)
Platelets: 252 10*3/uL (ref 150–400)
RBC: 3.77 MIL/uL — ABNORMAL LOW (ref 3.87–5.11)
RDW: 15.9 % — ABNORMAL HIGH (ref 11.5–15.5)
WBC: 8 10*3/uL (ref 4.0–10.5)
nRBC: 0 % (ref 0.0–0.2)

## 2020-02-25 NOTE — Progress Notes (Signed)
PCP - Standley Brooking. Regis Bill, MD Cardiologist - Kirk Ruths, MD  PPM/ICD - Denies  Chest x-ray - 09/21/19 EKG - 10/03/19 Stress Test - 01/09/13 ECHO - 01/01/20 Cardiac Cath - 12/16/2002  Sleep Study - Denies  Patient denies being a diabetic.  Blood Thinner Instructions:  Per Pharmacy and MD recommendations bridge is as follows: 6/2 - Pt to stop Coumadin  6/3 - Lovenox 80mg  SQ BID 6/4 - Lovenox 80mg  SQ BID  6/5 - Lovenox 80mg  SQ BID 6/6 - Lovenox 80mg  SQ BID  6/7 - Lovenox 80mg  SQ BID 6/8 - Surgery  6/9 - Resume Coumadin at previous dose, Lovenox 80mg  BID  6/10 - Coumadin, Lovenox 80mg  SQ BID 6/11 - Coumadin, Lovenox 80mg  SQ BID  6/12 - Coumadin, Lovenox 80mg  SQ BID  6/13 - Coumadin, Lovenox 80mg  SQ BID  6/14 - Coumadin, Lovenox 80mg  SQ BID Patient will need INR check on this date, 6/14 to ensure therapeutic level.  Aspirin Instructions: N/A  ERAS Protcol - Yes PRE-SURGERY Ensure or G2- Ensure given  COVID TEST- 03/05/20   Anesthesia review: Yes, cardiac hx.  Patient denies shortness of breath, fever, cough and chest pain at PAT appointment   All instructions explained to the patient, with a verbal understanding of the material. Patient agrees to go over the instructions while at home for a better understanding. Patient also instructed to self quarantine after being tested for COVID-19. The opportunity to ask questions was provided.

## 2020-02-26 NOTE — Anesthesia Preprocedure Evaluation (Addendum)
Anesthesia Evaluation  Patient identified by MRN, date of birth, ID band Patient awake    Reviewed: Allergy & Precautions, NPO status , Patient's Chart, lab work & pertinent test results  Airway Mallampati: II  TM Distance: >3 FB Neck ROM: Full    Dental  (+) Missing   Pulmonary asthma , former smoker,    Pulmonary exam normal breath sounds clear to auscultation       Cardiovascular hypertension, Pt. on medications and Pt. on home beta blockers + CAD, + Peripheral Vascular Disease, +CHF and + DVT  Normal cardiovascular exam+ Valvular Problems/Murmurs AI  Rhythm:Regular Rate:Normal  ECHO: 1. Left ventricular ejection fraction, by estimation, is 50 to 55%. The left ventricle has low normal function. The left ventricle demonstrates regional wall motion abnormalities (see scoring diagram/findings for description). Left ventricular diastolic parameters are consistent with Grade I diastolic dysfunction (impaired relaxation). There is mild hypokinesis of the left ventricular, mid-apical septal wall. 2. Right ventricular systolic function is normal. The right ventricular size is normal. There is normal pulmonary artery systolic pressure. 3. Left atrial size was mildly dilated. 4. The mitral valve is normal in structure. Mild mitral valve regurgitation. No evidence of mitral stenosis. 5. The aortic valve is tricuspid. Aortic valve regurgitation is mild to moderate. Mild aortic valve sclerosis is present, with no evidence of aortic valve stenosis. 6. The inferior vena cava is normal in size with greater than 50% respiratory variability, suggesting right atrial pressure of 3 mmHg.   Neuro/Psych  Headaches, negative psych ROS   GI/Hepatic negative GI ROS, Neg liver ROS,   Endo/Other  negative endocrine ROS  Renal/GU Renal InsufficiencyRenal disease     Musculoskeletal  (+) Arthritis ,   Abdominal (+) + obese,   Peds   Hematology  (+) anemia , HLD   Anesthesia Other Findings LEFT BREAST CANCER  Reproductive/Obstetrics                           Anesthesia Physical Anesthesia Plan  ASA: III  Anesthesia Plan: General and Regional   Post-op Pain Management: GA combined w/ Regional for post-op pain   Induction: Intravenous  PONV Risk Score and Plan: 3 and Ondansetron, Dexamethasone, Midazolam and Treatment may vary due to age or medical condition  Airway Management Planned: LMA  Additional Equipment:   Intra-op Plan:   Post-operative Plan: Extubation in OR  Informed Consent: I have reviewed the patients History and Physical, chart, labs and discussed the procedure including the risks, benefits and alternatives for the proposed anesthesia with the patient or authorized representative who has indicated his/her understanding and acceptance.     Dental advisory given  Plan Discussed with: CRNA  Anesthesia Plan Comments: (PAT note by Karoline Caldwell, PA-C: Follows with cardiology for hx of nonischemic cardiomyopathy andaortic insufficiency. Previous catheterization in 2004 showed no obstructive coronary disease. CT of her chest in Nov 2008 showed no thoracic aneurysm. Previous Holter monitor secondary to "dizzy spells" showed PACs and PVCs. Nuclear study April 2014 showed an ejection fraction of 62% and normal perfusion. Patient underwent transesophageal echocardiogram in May of 2014. Her ejection fraction was 50-55%. There was moderate aortic insufficiency and mild mitral regurgitation. There was a linear density associated with the atrial septum of uncertain etiology. Patient has had occasional noncompliance in the past with medications. If blood pressure not controlled LV function noted to be reduced with worsening aortic insufficiency. Most recent echocardiogram 01/02/20 showed EF 50-55%, Mild hypokinesis  of the left ventricular, mid-apical septal wall, mild MR, mild-mod  AR.  Cardiac clearance per telephone encounter 01/20/20, "Chart reviewed as part of pre-operative protocol coverage. Patient was contacted 01/20/2020 in reference to pre-operative risk assessment for pending surgery as outlined below.  [name] Foster was last seen on 11/05/19 by Dr. Stanford Breed.  Since that day, [name] Foster has done well. Recent echo with normal EF. She can complete 4.0 METS without angina. Therefore, based on ACC/AHA guidelines, the patient would be at acceptable risk for the planned procedure without further cardiovascular testing.  "  Pt on coumadin for hx of recurrent DVT, followed by PCP, on lovenox bridge for surgery per pharmacy.   Preop labs reviewed. Creatinine 1.78 which is near her baseline. Anemia with Hgb 10.2 which is slightly above her recent baseline.   EKG 10/03/19: Sinus bradycardia. Rate 59. Left axis deviation. Moderate voltage criteria for LVH, may be normal variant. Nonspecific T wave abnormality  TTE 01/01/20: 1. Left ventricular ejection fraction, by estimation, is 50 to 55%. The  left ventricle has low normal function. The left ventricle demonstrates  regional wall motion abnormalities (see scoring diagram/findings for  description). Left ventricular diastolic  parameters are consistent with Grade I diastolic dysfunction (impaired  relaxation). There is mild hypokinesis of the left ventricular, mid-apical  septal wall.  2. Right ventricular systolic function is normal. The right ventricular  size is normal. There is normal pulmonary artery systolic pressure.  3. Left atrial size was mildly dilated.  4. The mitral valve is normal in structure. Mild mitral valve  regurgitation. No evidence of mitral stenosis.  5. The aortic valve is tricuspid. Aortic valve regurgitation is mild to  moderate. Mild aortic valve sclerosis is present, with no evidence of  aortic valve stenosis.  6. The inferior vena cava is normal in size with greater than 50%   respiratory variability, suggesting right atrial pressure of 3 mmHg.    )      Anesthesia Quick Evaluation

## 2020-02-26 NOTE — Progress Notes (Signed)
Anesthesia Chart Review:  Follows with cardiology for hx of nonischemic cardiomyopathy andaortic insufficiency. Previous catheterization in 2004 showed no obstructive coronary disease. CT of her chest in Nov 2008 showed no thoracic aneurysm. Previous Holter monitor secondary to "dizzy spells" showed PACs and PVCs. Nuclear study April 2014 showed an ejection fraction of 62% and normal perfusion. Patient underwent transesophageal echocardiogram in May of 2014. Her ejection fraction was 50-55%. There was moderate aortic insufficiency and mild mitral regurgitation. There was a linear density associated with the atrial septum of uncertain etiology. Patient has had occasional noncompliance in the past with medications. If blood pressure not controlled LV function noted to be reduced with worsening aortic insufficiency. Most recent echocardiogram 01/02/20 showed EF 50-55%, Mild hypokinesis of the left ventricular, mid-apical septal wall, mild MR, mild-mod AR.  Cardiac clearance per telephone encounter 01/20/20, "Chart reviewed as part of pre-operative protocol coverage. Patient was contacted 01/20/2020 in reference to pre-operative risk assessment for pending surgery as outlined below.  [name] Foster was last seen on 11/05/19 by Dr. Stanford Breed.  Since that day, [name] Foster has done well. Recent echo with normal EF. She can complete 4.0 METS without angina. Therefore, based on ACC/AHA guidelines, the patient would be at acceptable risk for the planned procedure without further cardiovascular testing.  "  Pt on coumadin for hx of recurrent DVT, followed by PCP, on lovenox bridge for surgery per pharmacy.   Preop labs reviewed. Creatinine 1.78 which is near her baseline. Anemia with Hgb 10.2 which is slightly above her recent baseline.   EKG 10/03/19: Sinus bradycardia. Rate 59. Left axis deviation. Moderate voltage criteria for LVH, may be normal variant. Nonspecific T wave abnormality  TTE 01/01/20: 1. Left  ventricular ejection fraction, by estimation, is 50 to 55%. The  left ventricle has low normal function. The left ventricle demonstrates  regional wall motion abnormalities (see scoring diagram/findings for  description). Left ventricular diastolic  parameters are consistent with Grade I diastolic dysfunction (impaired  relaxation). There is mild hypokinesis of the left ventricular, mid-apical  septal wall.  2. Right ventricular systolic function is normal. The right ventricular  size is normal. There is normal pulmonary artery systolic pressure.  3. Left atrial size was mildly dilated.  4. The mitral valve is normal in structure. Mild mitral valve  regurgitation. No evidence of mitral stenosis.  5. The aortic valve is tricuspid. Aortic valve regurgitation is mild to  moderate. Mild aortic valve sclerosis is present, with no evidence of  aortic valve stenosis.  6. The inferior vena cava is normal in size with greater than 50%  respiratory variability, suggesting right atrial pressure of 3 mmHg.     Wynonia Musty [facility] Short Stay Center/Anesthesiology Phone [phone] 02/26/2020 11:36 AM

## 2020-02-27 NOTE — Progress Notes (Signed)
Pharmacist Chemotherapy Monitoring - Follow Up Assessment    I verify that I have reviewed each item in the below checklist:   Regimen for the patient is scheduled for the appropriate day and plan matches scheduled date.  Appropriate non-routine labs are ordered dependent on drug ordered.  If applicable, additional medications reviewed and ordered per protocol based on lifetime cumulative doses and/or treatment regimen.   Plan for follow-up and/or issues identified: No  I-vent associated with next due treatment: No  MD and/or nursing notified: No  Ranard Harte D 02/27/2020 1:37 PM

## 2020-03-02 ENCOUNTER — Other Ambulatory Visit: Payer: Self-pay | Admitting: Cardiology

## 2020-03-02 ENCOUNTER — Other Ambulatory Visit: Payer: Self-pay | Admitting: Internal Medicine

## 2020-03-02 DIAGNOSIS — Z7901 Long term (current) use of anticoagulants: Secondary | ICD-10-CM

## 2020-03-05 ENCOUNTER — Inpatient Hospital Stay: Payer: Medicare Other | Attending: Hematology and Oncology

## 2020-03-05 ENCOUNTER — Inpatient Hospital Stay: Payer: Medicare Other

## 2020-03-05 ENCOUNTER — Other Ambulatory Visit (HOSPITAL_COMMUNITY)
Admission: RE | Admit: 2020-03-05 | Discharge: 2020-03-05 | Disposition: A | Discharge status: Home / Self Care | Payer: Medicare Other | Source: Ambulatory Visit | Attending: General Surgery | Admitting: General Surgery

## 2020-03-05 ENCOUNTER — Other Ambulatory Visit: Payer: Self-pay

## 2020-03-05 VITALS — BP 143/70 | HR 56 | Temp 98.2°F | Resp 16 | Wt 195.8 lb

## 2020-03-05 DIAGNOSIS — Z5111 Encounter for antineoplastic chemotherapy: Secondary | ICD-10-CM | POA: Diagnosis not present

## 2020-03-05 DIAGNOSIS — Z171 Estrogen receptor negative status [ER-]: Secondary | ICD-10-CM

## 2020-03-05 DIAGNOSIS — Z7901 Long term (current) use of anticoagulants: Secondary | ICD-10-CM | POA: Insufficient documentation

## 2020-03-05 DIAGNOSIS — Z20822 Contact with and (suspected) exposure to covid-19: Secondary | ICD-10-CM | POA: Insufficient documentation

## 2020-03-05 DIAGNOSIS — Z01812 Encounter for preprocedural laboratory examination: Secondary | ICD-10-CM | POA: Diagnosis not present

## 2020-03-05 DIAGNOSIS — C50812 Malignant neoplasm of overlapping sites of left female breast: Secondary | ICD-10-CM

## 2020-03-05 DIAGNOSIS — Z5112 Encounter for antineoplastic immunotherapy: Secondary | ICD-10-CM | POA: Diagnosis present

## 2020-03-05 LAB — SARS CORONAVIRUS 2 (TAT 6-24 HRS): SARS Coronavirus 2: NEGATIVE

## 2020-03-05 LAB — PROTIME-INR
INR: 2.4 — ABNORMAL HIGH (ref 0.8–1.2)
Prothrombin Time: 24.9 seconds — ABNORMAL HIGH (ref 11.4–15.2)

## 2020-03-05 MED ORDER — HEPARIN SOD (PORK) LOCK FLUSH 100 UNIT/ML IV SOLN
500.0000 [IU] | Freq: Once | INTRAVENOUS | Status: AC | PRN
  Administered 2020-03-05: 500 [IU]
  Filled 2020-03-05: qty 5

## 2020-03-05 MED ORDER — SODIUM CHLORIDE 0.9 % IV SOLN
Freq: Once | INTRAVENOUS | Status: AC
  Filled 2020-03-05: qty 250

## 2020-03-05 MED ORDER — DIPHENHYDRAMINE HCL 25 MG PO CAPS
ORAL_CAPSULE | ORAL | Status: AC
  Filled 2020-03-05: qty 2

## 2020-03-05 MED ORDER — DIPHENHYDRAMINE HCL 25 MG PO CAPS
50.0000 mg | ORAL_CAPSULE | Freq: Once | ORAL | Status: AC
  Administered 2020-03-05: 50 mg via ORAL

## 2020-03-05 MED ORDER — ACETAMINOPHEN 325 MG PO TABS
ORAL_TABLET | ORAL | Status: AC
  Filled 2020-03-05: qty 2

## 2020-03-05 MED ORDER — ACETAMINOPHEN 325 MG PO TABS
650.0000 mg | ORAL_TABLET | Freq: Once | ORAL | Status: AC
  Administered 2020-03-05: 650 mg via ORAL

## 2020-03-05 MED ORDER — TRASTUZUMAB-ANNS CHEMO 150 MG IV SOLR
6.0000 mg/kg | Freq: Once | INTRAVENOUS | Status: AC
  Administered 2020-03-05: 504 mg via INTRAVENOUS
  Filled 2020-03-05: qty 24

## 2020-03-05 MED ORDER — SODIUM CHLORIDE 0.9% FLUSH
10.0000 mL | INTRAVENOUS | Status: DC | PRN
  Administered 2020-03-05: 10 mL
  Filled 2020-03-05: qty 10

## 2020-03-05 NOTE — Patient Instructions (Signed)
[facility] Discharge Instructions for Patients Receiving Chemotherapy  Today you received the following Immunotherapy agent: Trastuzumab  To help prevent nausea and vomiting after your treatment, we encourage you to take your nausea medication as directed by your MD.   If you develop nausea and vomiting that is not controlled by your nausea medication, call the clinic.   BELOW ARE SYMPTOMS THAT SHOULD BE REPORTED IMMEDIATELY:  *FEVER GREATER THAN 100.5 F  *CHILLS WITH OR WITHOUT FEVER  NAUSEA AND VOMITING THAT IS NOT CONTROLLED WITH YOUR NAUSEA MEDICATION  *UNUSUAL SHORTNESS OF BREATH  *UNUSUAL BRUISING OR BLEEDING  TENDERNESS IN MOUTH AND THROAT WITH OR WITHOUT PRESENCE OF ULCERS  *URINARY PROBLEMS  *BOWEL PROBLEMS  UNUSUAL RASH Items with * indicate a potential emergency and should be followed up as soon as possible.  Feel free to call the clinic should you have any questions or concerns. The clinic phone number is [phone].  Please show the [place] at check-in to the Emergency Department and triage nurse.

## 2020-03-08 NOTE — H&P (Signed)
Collene Foster Location: [facility] Surgery Patient #: [id number] DOB: 1951/06/07 Single / Language: [name] Foster / Race: Black or African American Female   History of Present Illness The patient is a 69 year old female who presents for a follow-up for Breast cancer. Pt is a 69 yo F referred for new diagnosis of left breast cancer 07/2019. She found the mass herself on the left. She did have normal screening mammogram 04/2018. She underwent diagnostic imaging and was seen to have multiple findings. She had a 3.6 cm mass corresponding to the palpable mass. She also had a mass at 1 o'clock that was 4 mm. There were many suspicious calcifications behind the mass, and there were more than 4 suspicious nodes. The two masses and the largest node were biopsied. They were all grade 3 invasive ductal carcinoma, ER/PR negative, her 2 positive, Ki 67 70%. She does have significant family cancer history. She has a mother with colon cancer, maternal cousin wtih breast cancer, maternal aunt with breast cancer, a maternal grandmother with cervical cancer, and a maternal aunt that had throat cancer. She had menarche at age 11. She is post menopausal/ s/p hysterectomy. She is a G2P2 with first child at age 82. She took OCPs for 2-3 years and took HRT only for a few months. She had a colonoscopy in 2005 and has never had a bone density study.   Of note, she has had three blood clots "for no reason" and is on coumadin. This is managed by Dr. Regis Bill.   She has completed neoadjuvant chemotherapy. She got sick and had to be admitted for a period of time during chemo.   She had follow up MR and had complete imaging response to chemo. However, she had many small spots and these weren't all biopsied they looked similar to others that were biopsied and were cancer.   repeat breast MR 01/05/2020 CLINICAL DATA: 69 year old female with known metastatic left breast cancer presenting for  re-evaluation after neoadjuvant chemotherapy.  LABS: None performed on site.  EXAM: BILATERAL BREAST MRI WITH AND WITHOUT CONTRAST  TECHNIQUE: Multiplanar, multisequence MR images of both breasts were obtained prior to and following the intravenous administration of 8 ml of Gadavist.  Three-dimensional MR images were rendered by post-processing of the original MR data on an independent workstation. The three-dimensional MR images were interpreted, and findings are reported in the following complete MRI report for this study. Three dimensional images were evaluated at the independent DynaCad workstation  COMPARISON: Previous exam(s).  FINDINGS: Breast composition: b. Scattered fibroglandular tissue.  Background parenchymal enhancement: Mild.  Right breast: No mass or abnormal enhancement.  Left breast: Susceptibility artifact from 2 sites of previous left breast biopsy are noted within the lateral left breast. There has been complete interval resolution of the previously biopsied dominant mass in the 3-4 o'clock position and multiple additional enhancing masses throughout the upper-outer quadrant.  There has also been complete interval resolution of previously skin skin thickening, enhancement and edema.  Lymph nodes: No abnormal appearing lymph nodes. Previously seen, abnormal level 1 left axillary lymph nodes have resolved in the interim.  Ancillary findings: None.  IMPRESSION: Complete interval resolution of previously seen left breast masses, skin thickening/enhancement and lymphadenopathy, consistent with excellent treatment response.  RECOMMENDATION: Per clinical treatment plan.  BI-RADS CATEGORY 6: Known biopsy-proven malignancy.      Allergies  Aspirin *ANALGESICS - NonNarcotic*  Penicillins  Tetanus Toxoids  Allergies Reconciled   Medication History Albuterol (90MCG/ACT Aerosol  Soln, Inhalation) Active. Norvasc (10MG  Tablet, Oral)  Active. Lipitor (20MG  Tablet, Oral) Active. Cholecalciferol (25 MCG(1000 UT) Capsule, Oral) Active. Allegra Allergy (180MG  Tablet, Oral) Active. Flonase (50MCG/DOSE Inhaler, Nasal) Active. Lasix (40MG  Tablet, Oral) Active. Apresoline (50MG  Tablet, Oral) Active. Lopressor (50MG  Tablet, Oral) Active. Potassium Chloride Crys ER (20MEQ Tablet ER, Oral) Active. Altace (10MG  Capsule, Oral) Active. Coumadin (5MG  Tablet, Oral) Active. Medications Reconciled    Review of Systems  All other systems negative  Vitals Weight: 189.5 lb Height: 66in Body Surface Area: 1.95 m Body Mass Index: 30.59 kg/m  Temp.: 97.73F  Pulse: 85 (Regular)  BP: 128/74(Sitting, Left Arm, Standard)       Physical Exam  General Mental Status-Alert. General Appearance-Consistent with stated age. Hydration-Well hydrated. Voice-Normal.  Head and Neck Head-normocephalic, atraumatic with no lesions or palpable masses.  Eye Sclera/Conjunctiva - Bilateral-No scleral icterus.  Chest and Lung Exam Chest and lung exam reveals -quiet, even and easy respiratory effort with no use of accessory muscles. Inspection Chest Wall - Normal. Back - normal.  Breast Note: no palpable breast mass or lymphadenopathy on either side. no nipple retraction or skin dimpling.   Cardiovascular Cardiovascular examination reveals -normal pedal pulses bilaterally. Note: regular rate and rhythm  Abdomen Inspection-Inspection Normal. Palpation/Percussion Palpation and Percussion of the abdomen reveal - Soft, Non Tender, No Rebound tenderness, No Rigidity (guarding) and No hepatosplenomegaly.  Peripheral Vascular Upper Extremity Inspection - Bilateral - Normal - No Clubbing, No Cyanosis, No Edema, Pulses Intact. Lower Extremity Palpation - Edema - Bilateral - No edema - Bilateral.  Neurologic Neurologic evaluation reveals -alert and oriented x 3 with no impairment of recent or  remote memory. Mental Status-Normal.  Musculoskeletal Global Assessment -Note: no gross deformities.  Normal Exam - Left-Upper Extremity Strength Normal and Lower Extremity Strength Normal. Normal Exam - Right-Upper Extremity Strength Normal and Lower Extremity Strength Normal.  Lymphatic Head & Neck  General Head & Neck Lymphatics: Bilateral - Description - Normal. Axillary  General Axillary Region: Bilateral - Description - Normal. Tenderness - Non Tender.    Assessment & Plan  MALIGNANT NEOPLASM OF OVERLAPPING SITES OF LEFT BREAST IN FEMALE, ESTROGEN RECEPTOR NEGATIVE (C50.812) Impression: Pt still needs modified radical mastectomy (left total mastectomy and left axillary lymph node dissection).  she never saw plastics because her appt was around the time of her hospitalizations. She will need to discuss reconstruction decide if she wants it and if so, what timing does she want. She will definitely need post mastectomy radiation given positive nodes.  I reviewed the risks with the patient and her daughter. The surgical procedure was described to the patient. I discussed the incision type and location and that we would need radiology involved on with a wire or seed marker and/or sentinel node.  The risks and benefits of the procedure were described to the patient and she wishes to proceed.  We discussed the risks bleeding, infection, damage to other structures, need for further procedures/surgeries. We discussed the risk of seroma. The patient was advised if the area in the breast in cancer, we may need to go back to surgery for additional tissue to obtain negative margins or for a lymph node biopsy. The patient was advised that these are the most common complications, but that others can occur as well. They were advised against taking aspirin or other anti-inflammatory agents/blood thinners the week before surgery. Current Plans Referred to Surgery - Plastic, for  evaluation and follow up (Plastic Surgery). Routine. You are being scheduled  for surgery- Our schedulers will call you.  You should hear from our office's scheduling department within 5 working days about the location, date, and time of surgery. We try to make accommodations for patient's preferences in scheduling surgery, but sometimes the OR schedule or the surgeon's schedule prevents [place] from making those accommodations.  If you have not heard from our office [phone] in 5 working days, call the office and ask for your surgeon's nurse.  If you have other questions about your diagnosis, plan, or surgery, call the office and ask for your surgeon's nurse.  Pt Education - CCS Mastectomy HCI WARFARIN ANTICOAGULATION (Z79.01) Impression: Will need to hold this pre op.

## 2020-03-09 ENCOUNTER — Encounter (HOSPITAL_COMMUNITY)
Admission: RE | Disposition: A | Discharge status: Home / Self Care | Payer: Self-pay | Source: Home / Self Care | Attending: General Surgery

## 2020-03-09 ENCOUNTER — Ambulatory Visit (HOSPITAL_COMMUNITY): Payer: Medicare Other | Admitting: Physician Assistant

## 2020-03-09 ENCOUNTER — Ambulatory Visit (HOSPITAL_COMMUNITY): Payer: Medicare Other | Admitting: Certified Registered Nurse Anesthetist

## 2020-03-09 ENCOUNTER — Encounter (HOSPITAL_COMMUNITY): Payer: Self-pay | Admitting: General Surgery

## 2020-03-09 ENCOUNTER — Other Ambulatory Visit: Payer: Self-pay

## 2020-03-09 ENCOUNTER — Inpatient Hospital Stay (HOSPITAL_COMMUNITY)
Admission: RE | Admit: 2020-03-09 | Discharge: 2020-03-11 | DRG: 580 | Disposition: A | Discharge status: Home / Self Care | Payer: Medicare Other | Attending: General Surgery | Admitting: General Surgery

## 2020-03-09 DIAGNOSIS — I10 Essential (primary) hypertension: Secondary | ICD-10-CM | POA: Diagnosis not present

## 2020-03-09 DIAGNOSIS — C773 Secondary and unspecified malignant neoplasm of axilla and upper limb lymph nodes: Secondary | ICD-10-CM | POA: Diagnosis present

## 2020-03-09 DIAGNOSIS — C50912 Malignant neoplasm of unspecified site of left female breast: Secondary | ICD-10-CM | POA: Diagnosis present

## 2020-03-09 DIAGNOSIS — D62 Acute posthemorrhagic anemia: Secondary | ICD-10-CM | POA: Diagnosis not present

## 2020-03-09 DIAGNOSIS — Z171 Estrogen receptor negative status [ER-]: Secondary | ICD-10-CM | POA: Diagnosis not present

## 2020-03-09 DIAGNOSIS — Z9221 Personal history of antineoplastic chemotherapy: Secondary | ICD-10-CM | POA: Diagnosis not present

## 2020-03-09 DIAGNOSIS — N179 Acute kidney failure, unspecified: Secondary | ICD-10-CM | POA: Diagnosis not present

## 2020-03-09 DIAGNOSIS — N184 Chronic kidney disease, stage 4 (severe): Secondary | ICD-10-CM | POA: Diagnosis not present

## 2020-03-09 DIAGNOSIS — Z800 Family history of malignant neoplasm of digestive organs: Secondary | ICD-10-CM | POA: Diagnosis not present

## 2020-03-09 DIAGNOSIS — I129 Hypertensive chronic kidney disease with stage 1 through stage 4 chronic kidney disease, or unspecified chronic kidney disease: Secondary | ICD-10-CM | POA: Diagnosis present

## 2020-03-09 DIAGNOSIS — C50812 Malignant neoplasm of overlapping sites of left female breast: Secondary | ICD-10-CM | POA: Diagnosis not present

## 2020-03-09 DIAGNOSIS — G8918 Other acute postprocedural pain: Secondary | ICD-10-CM | POA: Diagnosis not present

## 2020-03-09 DIAGNOSIS — Z86718 Personal history of other venous thrombosis and embolism: Secondary | ICD-10-CM

## 2020-03-09 DIAGNOSIS — Z1501 Genetic susceptibility to malignant neoplasm of breast: Secondary | ICD-10-CM

## 2020-03-09 HISTORY — PX: MASTECTOMY MODIFIED RADICAL: SUR848

## 2020-03-09 HISTORY — PX: MASTECTOMY MODIFIED RADICAL: SHX5962

## 2020-03-09 LAB — PROTIME-INR
INR: 1 (ref 0.8–1.2)
Prothrombin Time: 12.6 seconds (ref 11.4–15.2)

## 2020-03-09 SURGERY — MASTECTOMY, MODIFIED RADICAL
Anesthesia: Regional | Site: Breast | Laterality: Left

## 2020-03-09 MED ORDER — LIDOCAINE-EPINEPHRINE 1 %-1:100000 IJ SOLN: INTRAMUSCULAR | Status: DC | PRN

## 2020-03-09 MED ORDER — HYDRALAZINE HCL 25 MG PO TABS
25.0000 mg | ORAL_TABLET | Freq: Three times a day (TID) | ORAL | Status: DC
  Administered 2020-03-09 – 2020-03-11 (×6): 25 mg via ORAL
  Filled 2020-03-09 (×6): qty 1

## 2020-03-09 MED ORDER — ATORVASTATIN CALCIUM 10 MG PO TABS
20.0000 mg | ORAL_TABLET | Freq: Every day | ORAL | Status: DC
  Administered 2020-03-10 – 2020-03-11 (×2): 20 mg via ORAL
  Filled 2020-03-09 (×2): qty 2

## 2020-03-09 MED ORDER — CHLORHEXIDINE GLUCONATE CLOTH 2 % EX PADS: 6.0000 | MEDICATED_PAD | Freq: Once | CUTANEOUS | Status: DC

## 2020-03-09 MED ORDER — ACETAMINOPHEN 500 MG PO TABS
1000.0000 mg | ORAL_TABLET | ORAL | Status: AC
  Administered 2020-03-09: 1000 mg via ORAL
  Filled 2020-03-09: qty 2

## 2020-03-09 MED ORDER — FENTANYL CITRATE (PF) 100 MCG/2ML IJ SOLN
INTRAMUSCULAR | Status: AC
  Filled 2020-03-09: qty 2

## 2020-03-09 MED ORDER — KCL IN DEXTROSE-NACL 20-5-0.45 MEQ/L-%-% IV SOLN
INTRAVENOUS | Status: DC
  Filled 2020-03-09: qty 1000

## 2020-03-09 MED ORDER — ONDANSETRON HCL 4 MG/2ML IJ SOLN
INTRAMUSCULAR | Status: AC
  Filled 2020-03-09: qty 2

## 2020-03-09 MED ORDER — TRAMADOL HCL 50 MG PO TABS
50.0000 mg | ORAL_TABLET | Freq: Four times a day (QID) | ORAL | Status: DC | PRN
  Administered 2020-03-09 – 2020-03-11 (×6): 50 mg via ORAL
  Filled 2020-03-09 (×6): qty 1

## 2020-03-09 MED ORDER — PHENYLEPHRINE 40 MCG/ML (10ML) SYRINGE FOR IV PUSH (FOR BLOOD PRESSURE SUPPORT)
PREFILLED_SYRINGE | INTRAVENOUS | Status: DC | PRN
  Administered 2020-03-09 (×3): 80 ug via INTRAVENOUS

## 2020-03-09 MED ORDER — CARBOXYMETHYLCELLUL-GLYCERIN 1-0.25 % OP SOLN: 1.0000 [drp] | Freq: Two times a day (BID) | OPHTHALMIC | Status: DC | PRN

## 2020-03-09 MED ORDER — DIPHENHYDRAMINE HCL 12.5 MG/5ML PO ELIX: 12.5000 mg | ORAL_SOLUTION | Freq: Four times a day (QID) | ORAL | Status: DC | PRN

## 2020-03-09 MED ORDER — POLYVINYL ALCOHOL 1.4 % OP SOLN
1.0000 [drp] | Freq: Two times a day (BID) | OPHTHALMIC | Status: DC | PRN
  Filled 2020-03-09: qty 15

## 2020-03-09 MED ORDER — PROPOFOL 10 MG/ML IV BOLUS
INTRAVENOUS | Status: DC | PRN
  Administered 2020-03-09: 200 mg via INTRAVENOUS

## 2020-03-09 MED ORDER — SALINE SPRAY 0.65 % NA SOLN
1.0000 | NASAL | Status: DC | PRN
  Filled 2020-03-09: qty 44

## 2020-03-09 MED ORDER — LIDOCAINE-EPINEPHRINE 1 %-1:100000 IJ SOLN
INTRAMUSCULAR | Status: AC
  Filled 2020-03-09: qty 1

## 2020-03-09 MED ORDER — METOPROLOL TARTRATE 50 MG PO TABS
50.0000 mg | ORAL_TABLET | Freq: Two times a day (BID) | ORAL | Status: DC
  Administered 2020-03-09 – 2020-03-11 (×3): 50 mg via ORAL
  Filled 2020-03-09 (×4): qty 1

## 2020-03-09 MED ORDER — LACTATED RINGERS IV SOLN: INTRAVENOUS | Status: DC

## 2020-03-09 MED ORDER — ORAL CARE MOUTH RINSE: 15.0000 mL | Freq: Once | OROMUCOSAL | Status: AC

## 2020-03-09 MED ORDER — ONDANSETRON HCL 4 MG/2ML IJ SOLN: 4.0000 mg | Freq: Once | INTRAMUSCULAR | Status: DC | PRN

## 2020-03-09 MED ORDER — LIDOCAINE-PRILOCAINE 2.5-2.5 % EX CREA
1.0000 "application " | TOPICAL_CREAM | CUTANEOUS | Status: DC
  Filled 2020-03-09: qty 5

## 2020-03-09 MED ORDER — 0.9 % SODIUM CHLORIDE (POUR BTL) OPTIME
TOPICAL | Status: DC | PRN
  Administered 2020-03-09: 1000 mL

## 2020-03-09 MED ORDER — MIDAZOLAM HCL 2 MG/2ML IJ SOLN: 2.0000 mg | Freq: Once | INTRAMUSCULAR | Status: DC

## 2020-03-09 MED ORDER — FLUTICASONE PROPIONATE 50 MCG/ACT NA SUSP
2.0000 | Freq: Every day | NASAL | Status: DC
  Administered 2020-03-11: 2 via NASAL
  Filled 2020-03-09: qty 16

## 2020-03-09 MED ORDER — FUROSEMIDE 40 MG PO TABS
40.0000 mg | ORAL_TABLET | Freq: Every day | ORAL | Status: DC
  Administered 2020-03-09 – 2020-03-10 (×2): 40 mg via ORAL
  Filled 2020-03-09 (×2): qty 1

## 2020-03-09 MED ORDER — DIPHENHYDRAMINE HCL 50 MG/ML IJ SOLN: 12.5000 mg | Freq: Four times a day (QID) | INTRAMUSCULAR | Status: DC | PRN

## 2020-03-09 MED ORDER — BUPIVACAINE HCL (PF) 0.25 % IJ SOLN
INTRAMUSCULAR | Status: AC
  Filled 2020-03-09: qty 30

## 2020-03-09 MED ORDER — PROPOFOL 10 MG/ML IV BOLUS
INTRAVENOUS | Status: AC
  Filled 2020-03-09: qty 40

## 2020-03-09 MED ORDER — ONDANSETRON 4 MG PO TBDP: 4.0000 mg | ORAL_TABLET | Freq: Four times a day (QID) | ORAL | Status: DC | PRN

## 2020-03-09 MED ORDER — EPHEDRINE SULFATE-NACL 50-0.9 MG/10ML-% IV SOSY
PREFILLED_SYRINGE | INTRAVENOUS | Status: DC | PRN
  Administered 2020-03-09: 5 mg via INTRAVENOUS
  Administered 2020-03-09: 10 mg via INTRAVENOUS
  Administered 2020-03-09: 5 mg via INTRAVENOUS

## 2020-03-09 MED ORDER — SENNA 8.6 MG PO TABS
1.0000 | ORAL_TABLET | Freq: Two times a day (BID) | ORAL | Status: DC
  Administered 2020-03-10 – 2020-03-11 (×3): 8.6 mg via ORAL
  Filled 2020-03-09 (×3): qty 1

## 2020-03-09 MED ORDER — WHITE PETROLATUM EX OINT
TOPICAL_OINTMENT | CUTANEOUS | Status: AC
  Filled 2020-03-09: qty 28.35

## 2020-03-09 MED ORDER — LORATADINE 10 MG PO TABS
10.0000 mg | ORAL_TABLET | Freq: Every day | ORAL | Status: DC
  Administered 2020-03-10 – 2020-03-11 (×2): 10 mg via ORAL
  Filled 2020-03-09 (×2): qty 1

## 2020-03-09 MED ORDER — CIPROFLOXACIN IN D5W 400 MG/200ML IV SOLN
400.0000 mg | Freq: Two times a day (BID) | INTRAVENOUS | Status: AC
  Administered 2020-03-09: 400 mg via INTRAVENOUS
  Filled 2020-03-09: qty 200

## 2020-03-09 MED ORDER — ONDANSETRON HCL 4 MG/2ML IJ SOLN: 4.0000 mg | Freq: Four times a day (QID) | INTRAMUSCULAR | Status: DC | PRN

## 2020-03-09 MED ORDER — CIPROFLOXACIN IN D5W 400 MG/200ML IV SOLN
400.0000 mg | INTRAVENOUS | Status: AC
  Administered 2020-03-09: 400 mg via INTRAVENOUS
  Filled 2020-03-09: qty 200

## 2020-03-09 MED ORDER — LIDOCAINE HCL (CARDIAC) PF 100 MG/5ML IV SOSY
PREFILLED_SYRINGE | INTRAVENOUS | Status: DC | PRN
  Administered 2020-03-09: 40 mg via INTRAVENOUS

## 2020-03-09 MED ORDER — ALBUTEROL SULFATE (2.5 MG/3ML) 0.083% IN NEBU: 2.5000 mg | INHALATION_SOLUTION | Freq: Four times a day (QID) | RESPIRATORY_TRACT | Status: DC | PRN

## 2020-03-09 MED ORDER — MIDAZOLAM HCL 2 MG/2ML IJ SOLN
INTRAMUSCULAR | Status: AC
  Administered 2020-03-09: 2 mg
  Filled 2020-03-09: qty 2

## 2020-03-09 MED ORDER — EPHEDRINE 5 MG/ML INJ
INTRAVENOUS | Status: AC
  Filled 2020-03-09: qty 10

## 2020-03-09 MED ORDER — METHOCARBAMOL 500 MG PO TABS
500.0000 mg | ORAL_TABLET | Freq: Four times a day (QID) | ORAL | Status: DC | PRN
  Administered 2020-03-09 – 2020-03-11 (×6): 500 mg via ORAL
  Filled 2020-03-09 (×6): qty 1

## 2020-03-09 MED ORDER — AMLODIPINE BESYLATE 10 MG PO TABS
10.0000 mg | ORAL_TABLET | Freq: Every day | ORAL | Status: DC
  Administered 2020-03-10 – 2020-03-11 (×2): 10 mg via ORAL
  Filled 2020-03-09 (×2): qty 1

## 2020-03-09 MED ORDER — HEMOSTATIC AGENTS (NO CHARGE) OPTIME
TOPICAL | Status: DC | PRN
  Administered 2020-03-09: 1 via TOPICAL

## 2020-03-09 MED ORDER — CHLORHEXIDINE GLUCONATE 0.12 % MT SOLN
15.0000 mL | Freq: Once | OROMUCOSAL | Status: AC
  Administered 2020-03-09: 15 mL via OROMUCOSAL
  Filled 2020-03-09: qty 15

## 2020-03-09 MED ORDER — SCOPOLAMINE 1 MG/3DAYS TD PT72
1.0000 | MEDICATED_PATCH | TRANSDERMAL | Status: DC
  Administered 2020-03-09: 1.5 mg via TRANSDERMAL
  Filled 2020-03-09: qty 1

## 2020-03-09 MED ORDER — PHENYLEPHRINE 40 MCG/ML (10ML) SYRINGE FOR IV PUSH (FOR BLOOD PRESSURE SUPPORT)
PREFILLED_SYRINGE | INTRAVENOUS | Status: AC
  Filled 2020-03-09: qty 10

## 2020-03-09 MED ORDER — FENTANYL CITRATE (PF) 250 MCG/5ML IJ SOLN
INTRAMUSCULAR | Status: DC | PRN
  Administered 2020-03-09: 50 ug via INTRAVENOUS

## 2020-03-09 MED ORDER — DEXAMETHASONE SODIUM PHOSPHATE 10 MG/ML IJ SOLN
INTRAMUSCULAR | Status: DC | PRN
Start: 2020-03-09 — End: 2020-03-09
  Administered 2020-03-09: 5 mg via INTRAVENOUS

## 2020-03-09 MED ORDER — DEXAMETHASONE SODIUM PHOSPHATE 10 MG/ML IJ SOLN
INTRAMUSCULAR | Status: AC
  Filled 2020-03-09: qty 1

## 2020-03-09 MED ORDER — POTASSIUM CHLORIDE CRYS ER 20 MEQ PO TBCR
20.0000 meq | EXTENDED_RELEASE_TABLET | Freq: Every day | ORAL | Status: DC
  Administered 2020-03-09 – 2020-03-10 (×2): 20 meq via ORAL
  Filled 2020-03-09 (×2): qty 1

## 2020-03-09 MED ORDER — ONDANSETRON HCL 4 MG/2ML IJ SOLN
INTRAMUSCULAR | Status: DC | PRN
  Administered 2020-03-09: 4 mg via INTRAVENOUS

## 2020-03-09 MED ORDER — FENTANYL CITRATE (PF) 100 MCG/2ML IJ SOLN: 25.0000 ug | INTRAMUSCULAR | Status: DC | PRN

## 2020-03-09 MED ORDER — ACETAMINOPHEN 650 MG RE SUPP: 650.0000 mg | Freq: Four times a day (QID) | RECTAL | Status: DC | PRN

## 2020-03-09 MED ORDER — KETOROLAC TROMETHAMINE 30 MG/ML IJ SOLN
INTRAMUSCULAR | Status: AC
  Filled 2020-03-09: qty 1

## 2020-03-09 MED ORDER — ACETAMINOPHEN 325 MG PO TABS
650.0000 mg | ORAL_TABLET | Freq: Four times a day (QID) | ORAL | Status: DC | PRN
  Administered 2020-03-11: 650 mg via ORAL
  Filled 2020-03-09: qty 2

## 2020-03-09 MED ORDER — FENTANYL CITRATE (PF) 250 MCG/5ML IJ SOLN
INTRAMUSCULAR | Status: AC
  Filled 2020-03-09: qty 5

## 2020-03-09 MED ORDER — BUPIVACAINE HCL (PF) 0.25 % IJ SOLN: INTRAMUSCULAR | Status: DC | PRN

## 2020-03-09 MED ORDER — LIDOCAINE 2% (20 MG/ML) 5 ML SYRINGE
INTRAMUSCULAR | Status: AC
  Filled 2020-03-09: qty 5

## 2020-03-09 MED ORDER — CLONIDINE HCL (ANALGESIA) 100 MCG/ML EP SOLN
EPIDURAL | Status: DC | PRN
  Administered 2020-03-09: 100 ug

## 2020-03-09 MED ORDER — PHENYLEPHRINE HCL-NACL 10-0.9 MG/250ML-% IV SOLN
INTRAVENOUS | Status: DC | PRN
Start: 2020-03-09 — End: 2020-03-09
  Administered 2020-03-09: 25 ug/min via INTRAVENOUS

## 2020-03-09 MED ORDER — ROPIVACAINE HCL 5 MG/ML IJ SOLN
INTRAMUSCULAR | Status: DC | PRN
Start: 2020-03-09 — End: 2020-03-09
  Administered 2020-03-09: 30 mL via PERINEURAL

## 2020-03-09 SURGICAL SUPPLY — 53 items
ADH SKN CLS APL DERMABOND .7 (GAUZE/BANDAGES/DRESSINGS) ×2
AGENT HMST 10 BLLW SHRT CANN (HEMOSTASIS) ×2
APL PRP STRL LF DISP 70% ISPRP (MISCELLANEOUS) ×2
BINDER BREAST XLRG (GAUZE/BANDAGES/DRESSINGS) ×3 IMPLANT
BIOPATCH RED 1 DISK 7.0 (GAUZE/BANDAGES/DRESSINGS) ×4 IMPLANT
BIOPATCH RED 1IN DISK 7.0MM (GAUZE/BANDAGES/DRESSINGS) ×2
BNDG COHESIVE 6X5 TAN STRL LF (GAUZE/BANDAGES/DRESSINGS) ×4 IMPLANT
CANISTER SUCT 3000ML PPV (MISCELLANEOUS) ×4 IMPLANT
CHLORAPREP W/TINT 26 (MISCELLANEOUS) ×4 IMPLANT
CLIP VESOCCLUDE LG 6/CT (CLIP) ×3 IMPLANT
CLIP VESOCCLUDE MED 24/CT (CLIP) ×1 IMPLANT
CLIP VESOCCLUDE MED 6/CT (CLIP) ×6 IMPLANT
CLIP VESOCCLUDE SM WIDE 6/CT (CLIP) ×3 IMPLANT
CLOSURE WOUND 1/2 X4 (GAUZE/BANDAGES/DRESSINGS) ×2
COVER SURGICAL LIGHT HANDLE (MISCELLANEOUS) ×4 IMPLANT
DERMABOND ADVANCED (GAUZE/BANDAGES/DRESSINGS) ×2
DERMABOND ADVANCED .7 DNX12 (GAUZE/BANDAGES/DRESSINGS) ×2 IMPLANT
DRAIN CHANNEL 19F RND (DRAIN) ×8 IMPLANT
DRSG PAD ABDOMINAL 8X10 ST (GAUZE/BANDAGES/DRESSINGS) ×6 IMPLANT
DRSG TEGADERM 4X4.75 (GAUZE/BANDAGES/DRESSINGS) ×6 IMPLANT
ELECT BLADE 6.5 EXT (BLADE) ×3 IMPLANT
ELECT REM PT RETURN 9FT ADLT (ELECTROSURGICAL) ×4
ELECTRODE REM PT RTRN 9FT ADLT (ELECTROSURGICAL) ×2 IMPLANT
EVACUATOR SILICONE 100CC (DRAIN) ×8 IMPLANT
GAUZE SPONGE 4X4 12PLY STRL (GAUZE/BANDAGES/DRESSINGS) ×3 IMPLANT
GLOVE BIO SURGEON STRL SZ 6 (GLOVE) ×4 IMPLANT
GLOVE INDICATOR 6.5 STRL GRN (GLOVE) ×4 IMPLANT
GOWN STRL REUS W/ TWL LRG LVL3 (GOWN DISPOSABLE) ×4 IMPLANT
GOWN STRL REUS W/TWL 2XL LVL3 (GOWN DISPOSABLE) ×5 IMPLANT
GOWN STRL REUS W/TWL LRG LVL3 (GOWN DISPOSABLE) ×12
HEMOSTAT HEMOBLAST BELLOWS (HEMOSTASIS) ×3 IMPLANT
KIT BASIN OR (CUSTOM PROCEDURE TRAY) ×4 IMPLANT
KIT TURNOVER KIT B (KITS) ×4 IMPLANT
LIGHT WAVEGUIDE WIDE FLAT (MISCELLANEOUS) ×3 IMPLANT
NS IRRIG 1000ML POUR BTL (IV SOLUTION) ×4 IMPLANT
PACK GENERAL/GYN (CUSTOM PROCEDURE TRAY) ×4 IMPLANT
PACK UNIVERSAL I (CUSTOM PROCEDURE TRAY) ×4 IMPLANT
PAD ARMBOARD 7.5X6 YLW CONV (MISCELLANEOUS) ×8 IMPLANT
PENCIL SMOKE EVACUATOR (MISCELLANEOUS) ×4 IMPLANT
SLEEVE SUCTION CATH 165 (SLEEVE) ×3 IMPLANT
SPONGE LAP 18X18 RF (DISPOSABLE) ×6 IMPLANT
STOCKINETTE IMPERVIOUS 9X36 MD (GAUZE/BANDAGES/DRESSINGS) ×4 IMPLANT
STRIP CLOSURE SKIN 1/2X4 (GAUZE/BANDAGES/DRESSINGS) ×5 IMPLANT
SUT ETHILON 2 0 FS 18 (SUTURE) ×8 IMPLANT
SUT MON AB 4-0 PC3 18 (SUTURE) ×4 IMPLANT
SUT SILK 2 0 PERMA HAND 18 BK (SUTURE) ×4 IMPLANT
SUT SILK 2 0 TIES 10X30 (SUTURE) ×3 IMPLANT
SUT VIC AB 3-0 SH 27 (SUTURE) ×4
SUT VIC AB 3-0 SH 27X BRD (SUTURE) ×2 IMPLANT
SUT VIC AB 3-0 SH 8-18 (SUTURE) ×8 IMPLANT
SYR CONTROL 10ML LL (SYRINGE) ×3 IMPLANT
TOWEL GREEN STERILE (TOWEL DISPOSABLE) ×4 IMPLANT
TOWEL GREEN STERILE FF (TOWEL DISPOSABLE) ×4 IMPLANT

## 2020-03-09 NOTE — Op Note (Signed)
Left Modified Radical Mastectomy (left mastectomy with axillary lymph node dissection)   Indications: This patient presents with history of left breast cancer,  cT4N1M0, grade 3 invasive ductal carcinoma, -/-/+ receptors, s/p neoadjuvant chemotherapy, good imaging response.    Pre-operative Diagnosis: left breast cancer  Post-operative Diagnosis: same  Surgeon: Stark Klein   Anesthesia: General endotracheal anesthesia and pectoral block  ASA Class: 3  Procedure Details  The patient was seen in the Holding Room. The risks, benefits, complications, treatment options, and expected outcomes were discussed with the patient. The possibilities of reaction to medication, pulmonary aspiration, bleeding, infection, the need for additional procedures, failure to diagnose a condition, and creating a complication requiring transfusion or operation were discussed with the patient. The patient concurred with the proposed plan, giving informed consent.  The site of surgery properly noted/marked. The patient was taken to Operating Room # 2, identified as Nishtha Raider and the procedure verified as left modified radical Mastectomy. After induction of anesthesia, the left arm, breast, and chest were prepped and draped in standard fashion.  A Time Out was held and the above information confirmed.     The borders of the breast were identified and marked.  An anchor incision was marked out.  The incision was made with a #10 blade.  Mastectomy hooks were used to provide elevation of the skin edges, and the cautery was used to create the mastectomy flaps. Lighted retractors were also used to assist.   The dissection was taken to the fascia of the pectoralis major.  The penetrating vessels were clipped as needed.  The superior flap was taken medially to the lateral sternal border, superiorly to the inferior border of the clavicle.  The inferior flap was similarly created, inferiorly to the inframammary fold and laterally  to the border of the latissimus.  The breast was taken off including the pectoralis fascia and the axillary tail marked.    An axillary dissection was performed with removal of the associated lymph nodes and surrounding adipose tissue. This included levels I and II. This was accomplished by exposing the axillary vein anteriorly and inferiorly to the level of the pectoralis minor and laterally over the latissimus dorsi muscle. Posteriorly, the dissection continued to the subscapularis.  Small venous tributaries, lymphatics, and vessels were clipped and ligated or cauterized and divided. The subscapularis muscle was skeletonized. The long thoracic and thoracodorsal neurovascular bundles were identified and preserved.      Hemostasis was achieved with cautery.  Hemoblast was placed on the muscle and skin flaps Two 19 Fr Blake drains were placed, with the lateral one in the axilla and the medial one under the skin flaps.  The wound was then closed with a 3-0 Vicryl deep dermal interrupted sutures and 4-0 Vicryl subcuticular closure in layers.    Sterile dressings were applied. At the end of the operation, all sponge, instrument, and needle counts were correct.  Findings: No significant findings.      Estimated Blood Loss: <50 mL         Drains: Two 19 Fr blake drains.                Specimens: left breast and left axillary contents. Additional left breast skin.           Complications:  None; patient tolerated the procedure well.         Disposition: PACU - hemodynamically stable.         Condition: stable

## 2020-03-09 NOTE — Anesthesia Postprocedure Evaluation (Signed)
Anesthesia Post Note  Patient: [name] Foster  Procedure(s) Performed: LEFT MODIFIED RADICAL MASTECTOMY (Left Breast)     Patient location during evaluation: PACU Anesthesia Type: Regional and General Level of consciousness: awake Pain management: pain level controlled Vital Signs Assessment: post-procedure vital signs reviewed and stable Respiratory status: spontaneous breathing, nonlabored ventilation, respiratory function stable and patient connected to nasal cannula oxygen Cardiovascular status: blood pressure returned to baseline and stable Postop Assessment: no apparent nausea or vomiting Anesthetic complications: no    Last Vitals:  Vitals:   03/09/20 1307 03/09/20 1343  BP: 120/65 123/69  Pulse: (!) 57 (!) 54  Resp: 14 17  Temp: 36.5 C (!) 36.4 C  SpO2: 94% 100%    Last Pain:  Vitals:   03/09/20 1343  TempSrc: Oral  PainSc:                  [name] P Wilmer Santillo

## 2020-03-09 NOTE — Transfer of Care (Signed)
Immediate Anesthesia Transfer of Care Note  Patient: [name] Foster  Procedure(s) Performed: LEFT MODIFIED RADICAL MASTECTOMY (Left Breast)  Patient Location: PACU  Anesthesia Type:GA combined with regional for post-op pain  Level of Consciousness: awake, alert , oriented, drowsy and patient cooperative  Airway & Oxygen Therapy: Patient Spontanous Breathing and Patient connected to nasal cannula oxygen  Post-op Assessment: Report given to RN and Post -op Vital signs reviewed and stable  Post vital signs: Reviewed and stable  Last Vitals:  Vitals Value Taken Time  BP 126/69 03/09/20 1237  Temp    Pulse 57 03/09/20 1241  Resp 15 03/09/20 1241  SpO2 94 % 03/09/20 1241  Vitals shown include unvalidated device data.  Last Pain:  Vitals:   03/09/20 0812  TempSrc:   PainSc: 0-No pain         Complications: No apparent anesthesia complications

## 2020-03-09 NOTE — Progress Notes (Signed)
Pt. Reported 2 areas that appeared on her skin last week. On June 4 Pt. Noticed an area on right upper arm-deltoid area, redden, purplish color. 7cm x 7cm in diameter. Warm to touch. Also, on left arm, below elbow,anterior  Purplish in color 4 cm x4 cm in diameter. Pt. Reports the area on left arm hurts only when it is touched and had itching last night. Both Dr. Roanna Banning and Dr. Barry Dienes made aware.

## 2020-03-09 NOTE — Anesthesia Procedure Notes (Signed)
Procedure Name: LMA Insertion Date/Time: 03/09/2020 9:30 AM Performed by: Raenette Rover, CRNA Pre-anesthesia Checklist: Patient identified, Emergency Drugs available, Suction available and Patient being monitored Patient Re-evaluated:Patient Re-evaluated prior to induction Oxygen Delivery Method: Circle system utilized Preoxygenation: Pre-oxygenation with 100% oxygen Induction Type: IV induction LMA: LMA inserted LMA Size: 4.0 Number of attempts: 1 Placement Confirmation: positive ETCO2 and breath sounds checked- equal and bilateral Tube secured with: Tape Dental Injury: Teeth and Oropharynx as per pre-operative assessment

## 2020-03-09 NOTE — Discharge Instructions (Signed)
[facility] °[phone] ° °MASTECTOMY: POST OP INSTRUCTIONS ° °Always review your discharge instruction sheet given to you by the facility where your surgery was performed. °IF YOU HAVE DISABILITY OR FAMILY LEAVE FORMS, YOU MUST BRING THEM TO THE OFFICE FOR PROCESSING.   °DO NOT GIVE THEM TO YOUR DOCTOR. °A prescription for pain medication may be given to you upon discharge.  Take your pain medication as prescribed, if needed.  If narcotic pain medicine is not needed, then you may take acetaminophen (Tylenol) or ibuprofen (Advil) as needed. °1. Take your usually prescribed medications unless otherwise directed. °2. If you need a refill on your pain medication, please contact your pharmacy.  They will contact our office to request authorization.  Prescriptions will not be filled after 5pm or on week-ends. °3. You should follow a light diet the first few days after arrival home, such as soup and crackers, etc.  Resume your normal diet the day after surgery. °4. Most patients will experience some swelling and bruising on the chest and underarm.  Ice packs will help.  Swelling and bruising can take several days to resolve.  °5. It is common to experience some constipation if taking pain medication after surgery.  Increasing fluid intake and taking a stool softener (such as Colace) will usually help or prevent this problem from occurring.  A mild laxative (Milk of Magnesia or Miralax) should be taken according to package instructions if there are no bowel movements after 48 hours. °6. Unless discharge instructions indicate otherwise, leave your bandage dry and in place until your next appointment in 3-5 days.  You may take a limited sponge bath.  No tube baths or showers until the drains are removed.  You may have steri-strips (small skin tapes) in place directly over the incision.  These strips should be left on the skin for 7-10 days.  If your surgeon used skin glue on the incision, you may  shower in 24 hours.  The glue will flake off over the next 2-3 weeks.  Any sutures or staples will be removed at the office during your follow-up visit. °7. DRAINS:  If you have drains in place, it is important to keep a list of the amount of drainage produced each day in your drains.  Before leaving the hospital, you should be instructed on drain care.  Call our office if you have any questions about your drains. °8. ACTIVITIES:  You may resume regular (light) daily activities beginning the next day--such as daily self-care, walking, climbing stairs--gradually increasing activities as tolerated.  You may have sexual intercourse when it is comfortable.  Refrain from any heavy lifting or straining until approved by your doctor. °a. You may drive when you are no longer taking prescription pain medication, you can comfortably wear a seatbelt, and you can safely maneuver your car and apply brakes. °b. RETURN TO WORK:  __________________________________________________________ °9. You should see your doctor in the office for a follow-up appointment approximately 3-5 days after your surgery.  Your doctor’s nurse will typically make your follow-up appointment when she calls you with your pathology report.  Expect your pathology report 2-3 business days after your surgery.  You may call to check if you do not hear from us after three days.   °10. OTHER INSTRUCTIONS: ______________________________________________________________________________________________ ____________________________________________________________________________________________ ° °WHEN TO CALL YOUR DOCTOR: °1. Fever over 101.0 °2. Nausea and/or vomiting °3. Extreme swelling or bruising °4. Continued bleeding from incision. °5. Increased pain, redness, or drainage from the   The clinic staff is available to answer your questions during regular business hours.  Please don't hesitate to call and ask to speak to one of the nurses for clinical  concerns.  If you have a medical emergency, go to the nearest emergency room or call 911.  A surgeon from [facility] is always on call at the hospital. [address] [place], [place], [place]  [address] ? [address] [place], [place], [place]   [address] [phone] ? [phone] ? FAX [phone]    [facility] drains are used to remove extra fluid that normally builds up in a surgical wound after surgery. A surgical drain helps to heal a surgical wound. Different kinds of surgical drains include:  Active drains. These drains use suction to pull drainage away from the surgical wound. Drainage flows through a tube to a container outside of the body. With these drains, you need to keep the bulb or the drainage container flat (compressed) at all times, except while you empty it. Flattening the bulb or container creates suction.  Passive drains. These drains allow fluid to drain naturally, by gravity. Drainage flows through a tube to a bandage (dressing) or a container outside of the body. Passive drains do not need to be emptied. A drain is placed during surgery. Right after surgery, drainage is usually bright red and a little thicker than water. The drainage may gradually turn yellow or pink and become thinner. It is likely that your health care provider will remove the drain when the drainage stops or when the amount decreases to 1-2 Tbsp (15-30 mL) during a 24-hour period. Supplies needed:  Tape.  Germ-free cleaning solution (sterile saline).  Cotton swabs.  Split gauze drain sponge: 4 x 4 inches (10 x 10 cm).  Gauze square: 4 x 4 inches (10 x 10 cm). How to care for your surgical drain Care for your drain as told by your health care provider. This is important to help prevent infection. If your drain is placed at your back, or any other hard-to-reach area, ask another person to assist you in performing the following tasks: General care  Keep the  skin around the drain dry and covered with a dressing at all times.  Check your drain area every day for signs of infection. Check for: ? Redness, swelling, or pain. ? Pus or a bad smell. ? Cloudy drainage. ? Tenderness or pressure at the drain exit site. Changing the dressing Follow instructions from your health care provider about how to change your dressing. Change your dressing at least once a day. Change it more often if needed to keep the dressing dry. Make sure you: 1. Gather your supplies. 2. Wash your hands with soap and water before you change your dressing. If soap and water are not available, use hand sanitizer. 3. Remove the old dressing. Avoid using scissors to do that. 4. Wash your hands with soap and water again after removing the old dressing. 5. Use sterile saline to clean your skin around the drain. You may need to use a cotton swab to clean the skin. 6. Place the tube through the slit in a drain sponge. Place the drain sponge so that it covers your wound. 7. Place the gauze square or another drain sponge on top of the drain sponge that is on the wound. Make sure the tube is between those layers. 8. Tape the dressing to your skin. 9. Tape the drainage tube to your  skin 1-2 inches (2.5-5 cm) below the place where the tube enters your body. Taping keeps the tube from pulling on any stitches (sutures) that you have. 10. Wash your hands with soap and water. 11. Write down the color of your drainage and how often you change your dressing. How to empty your active drain  1. Make sure that you have a measuring cup that you can empty your drainage into. 2. Wash your hands with soap and water. If soap and water are not available, use hand sanitizer. 3. Loosen any pins or clips that hold the tube in place. 4. If your health care provider tells you to strip the tube to prevent clots and tube blockages: ? Hold the tube at the skin with one hand. Use your other hand to pinch the  tubing with your thumb and first finger. ? Gently move your fingers down the tube while squeezing very lightly. This clears any drainage, clots, or tissue from the tube. ? You may need to do this several times each day to keep the tube clear. Do not pull on the tube. 5. Open the bulb cap or the drain plug. Do not touch the inside of the cap or the bottom of the plug. 6. Turn the device upside down and gently squeeze. 7. Empty all of the drainage into the measuring cup. 8. Compress the bulb or the container and replace the cap or the plug. To compress the bulb or the container, squeeze it firmly in the middle while you close the cap or plug the container. 9. Write down the amount of drainage that you have in each 24-hour period. If you have less than 2 Tbsp (30 mL) of drainage during 24 hours, contact your health care provider. 10. Flush the drainage down the toilet. 11. Wash your hands with soap and water. Contact a health care provider if:  You have redness, swelling, or pain around your drain area.  You have pus or a bad smell coming from your drain area.  You have a fever or chills.  The skin around your drain is warm to the touch.  The amount of drainage that you have is increasing instead of decreasing.  You have drainage that is cloudy.  There is a sudden stop or a sudden decrease in the amount of drainage that you have.  Your drain tube falls out.  Your active drain does not stay compressed after you empty it. Summary  Surgical drains are used to remove extra fluid that normally builds up in a surgical wound after surgery.  Different kinds of surgical drains include active drains and passive drains. Active drains use suction to pull drainage away from the surgical wound, and passive drains allow fluid to drain naturally.  It is important to care for your drain to prevent infection. If your drain is placed at your back, or any other hard-to-reach area, ask another person to  assist you.  Contact your health care provider if you have redness, swelling, or pain around your drain area. This information is not intended to replace advice given to you by your health care provider. Make sure you discuss any questions you have with your health care provider. Document Revised: 10/23/2018 Document Reviewed: 10/23/2018 Elsevier Patient Education  2020 [employer]   

## 2020-03-09 NOTE — Interval H&P Note (Signed)
History and Physical Interval Note:  03/09/2020 9:12 AM  [name] Foster  has presented today for surgery, with the diagnosis of LEFT BREAST CANCER.  The various methods of treatment have been discussed with the patient and family. After consideration of risks, benefits and other options for treatment, the patient has consented to  Procedure(s) with comments: LEFT MODIFIED RADICAL MASTECTOMY (Left) - GEN AND PECTORAL BLOCK (This includes the LEFT AXILLARY LYMPH NODE DISSECTION) - GENERAL AND PECTORAL BLOCK as a surgical intervention.  The patient's history has been reviewed, patient examined, no change in status, stable for surgery.  I have reviewed the patient's chart and labs.  Questions were answered to the patient's satisfaction.     Stark Klein

## 2020-03-09 NOTE — Anesthesia Procedure Notes (Signed)
Anesthesia Regional Block: Pectoralis block   Pre-Anesthetic Checklist: ,, timeout performed, Correct Patient, Correct Site, Correct Laterality, Correct Procedure, Correct Position, site marked, Risks and benefits discussed,  Surgical consent,  Pre-op evaluation,  At surgeon's request and post-op pain management  Laterality: Left  Prep: chloraprep       Needles:  Injection technique: Single-shot  Needle Type: Echogenic Stimulator Needle     Needle Length: 10cm  Needle Gauge: 21     Additional Needles:   Procedures:,,,, ultrasound used (permanent image in chart),,,,  Narrative:  Start time: 03/09/2020 9:00 AM End time: 03/09/2020 9:10 AM Injection made incrementally with aspirations every 5 mL.  Performed by: Personally  Anesthesiologist: Murvin Natal, MD  Additional Notes: Functioning IV was confirmed and monitors were applied.  A timeout was performed. Sterile prep, hand hygiene and sterile gloves were used. A 180mm 20ga BBraun echogenic stimulator needle was used. Negative aspiration and negative test dose prior to incremental administration of local anesthetic. The patient tolerated the procedure well.  Ultrasound guidance: relevent anatomy identified, needle position confirmed, local anesthetic spread visualized around nerve(s), vascular puncture avoided.  Image printed for medical record.

## 2020-03-09 NOTE — Plan of Care (Signed)
  Problem: Education: Goal: Knowledge of General Education information will improve Description Including pain rating scale, medication(s)/side effects and non-pharmacologic comfort measures Outcome: Progressing   

## 2020-03-10 DIAGNOSIS — C50812 Malignant neoplasm of overlapping sites of left female breast: Secondary | ICD-10-CM | POA: Diagnosis present

## 2020-03-10 DIAGNOSIS — D62 Acute posthemorrhagic anemia: Secondary | ICD-10-CM | POA: Diagnosis not present

## 2020-03-10 DIAGNOSIS — Z800 Family history of malignant neoplasm of digestive organs: Secondary | ICD-10-CM | POA: Diagnosis not present

## 2020-03-10 DIAGNOSIS — Z1501 Genetic susceptibility to malignant neoplasm of breast: Secondary | ICD-10-CM | POA: Diagnosis not present

## 2020-03-10 DIAGNOSIS — C50912 Malignant neoplasm of unspecified site of left female breast: Secondary | ICD-10-CM | POA: Diagnosis not present

## 2020-03-10 DIAGNOSIS — C773 Secondary and unspecified malignant neoplasm of axilla and upper limb lymph nodes: Secondary | ICD-10-CM | POA: Diagnosis present

## 2020-03-10 DIAGNOSIS — Z171 Estrogen receptor negative status [ER-]: Secondary | ICD-10-CM | POA: Diagnosis not present

## 2020-03-10 DIAGNOSIS — I129 Hypertensive chronic kidney disease with stage 1 through stage 4 chronic kidney disease, or unspecified chronic kidney disease: Secondary | ICD-10-CM | POA: Diagnosis present

## 2020-03-10 DIAGNOSIS — Z86718 Personal history of other venous thrombosis and embolism: Secondary | ICD-10-CM | POA: Diagnosis not present

## 2020-03-10 DIAGNOSIS — N184 Chronic kidney disease, stage 4 (severe): Secondary | ICD-10-CM | POA: Diagnosis present

## 2020-03-10 DIAGNOSIS — N179 Acute kidney failure, unspecified: Secondary | ICD-10-CM | POA: Diagnosis not present

## 2020-03-10 DIAGNOSIS — Z9221 Personal history of antineoplastic chemotherapy: Secondary | ICD-10-CM | POA: Diagnosis not present

## 2020-03-10 LAB — BASIC METABOLIC PANEL
Anion gap: 9 (ref 5–15)
BUN: 22 mg/dL (ref 8–23)
CO2: 22 mmol/L (ref 22–32)
Calcium: 8.4 mg/dL — ABNORMAL LOW (ref 8.9–10.3)
Chloride: 108 mmol/L (ref 98–111)
Creatinine, Ser: 2.76 mg/dL — ABNORMAL HIGH (ref 0.44–1.00)
GFR calc Af Amer: 20 mL/min — ABNORMAL LOW (ref >60)
GFR calc non Af Amer: 17 mL/min — ABNORMAL LOW (ref >60)
Glucose, Bld: 142 mg/dL — ABNORMAL HIGH (ref 70–99)
Potassium: 4.5 mmol/L (ref 3.5–5.1)
Sodium: 139 mmol/L (ref 135–145)

## 2020-03-10 LAB — CBC
HCT: 27.3 % — ABNORMAL LOW (ref 36.0–46.0)
Hemoglobin: 8.4 g/dL — ABNORMAL LOW (ref 12.0–15.0)
MCH: 27.6 pg (ref 26.0–34.0)
MCHC: 30.8 g/dL (ref 30.0–36.0)
MCV: 89.8 fL (ref 80.0–100.0)
Platelets: 176 10*3/uL (ref 150–400)
RBC: 3.04 MIL/uL — ABNORMAL LOW (ref 3.87–5.11)
RDW: 16.2 % — ABNORMAL HIGH (ref 11.5–15.5)
WBC: 11.2 10*3/uL — ABNORMAL HIGH (ref 4.0–10.5)
nRBC: 0 % (ref 0.0–0.2)

## 2020-03-10 LAB — PROTIME-INR
INR: 1.1 (ref 0.8–1.2)
Prothrombin Time: 14 seconds (ref 11.4–15.2)

## 2020-03-10 MED ORDER — ENOXAPARIN SODIUM 100 MG/ML ~~LOC~~ SOLN: 1.0000 mg/kg | SUBCUTANEOUS | Status: DC

## 2020-03-10 MED ORDER — WARFARIN SODIUM 5 MG PO TABS
5.0000 mg | ORAL_TABLET | Freq: Once | ORAL | Status: AC
  Administered 2020-03-10: 5 mg via ORAL
  Filled 2020-03-10: qty 1

## 2020-03-10 MED ORDER — ENOXAPARIN SODIUM 80 MG/0.8ML ~~LOC~~ SOLN
80.0000 mg | SUBCUTANEOUS | Status: DC
  Administered 2020-03-10 – 2020-03-11 (×2): 80 mg via SUBCUTANEOUS
  Filled 2020-03-10 (×2): qty 0.8

## 2020-03-10 MED ORDER — SODIUM CHLORIDE 0.9 % IV BOLUS
500.0000 mL | Freq: Once | INTRAVENOUS | Status: AC
  Administered 2020-03-10: 500 mL via INTRAVENOUS

## 2020-03-10 MED ORDER — WARFARIN - PHYSICIAN DOSING INPATIENT: Freq: Every day | Status: DC

## 2020-03-10 MED ORDER — SODIUM CHLORIDE 0.9 % IV SOLN: INTRAVENOUS | Status: DC

## 2020-03-10 NOTE — Progress Notes (Signed)
1 Day Post-Op   Subjective/Chief Complaint: Pt is sore, but pain tolerable.     Objective: Vital signs in last 24 hours: Temp:  [97.5 F (36.4 C)-97.9 F (36.6 C)] 97.9 F (36.6 C) (06/09 0504) Pulse Rate:  [51-60] 57 (06/09 0504) Resp:  [12-18] 17 (06/09 0504) BP: (103-126)/(60-69) 103/60 (06/09 0504) SpO2:  [94 %-100 %] 100 % (06/09 0504) Last BM Date: 03/09/20  Intake/Output from previous day: 06/08 0701 - 06/09 0700 In: 1492.7 [P.O.:340; I.V.:952.7; IV Piggyback:200] Out: 210 [Drains:160; Blood:50] Intake/Output this shift: Total I/O In: -  Out: 80 [Drains:80]  General appearance: alert, cooperative and mild distress Resp: breathing comfortably Chest wall: anticipated left sided tenderness.  no flap hematoma in mastectomy site.  drains serosanguinous, not large vo.lume Extremities: extremities normal, atraumatic, no cyanosis or edema and ambulating with walker.  Lab Results:  Recent Labs    03/10/20 0254  WBC 11.2*  HGB 8.4*  HCT 27.3*  PLT 176   BMET Recent Labs    03/10/20 0254  NA 139  K 4.5  CL 108  CO2 22  GLUCOSE 142*  BUN 22  CREATININE 2.76*  CALCIUM 8.4*   PT/INR Recent Labs    03/09/20 0728  LABPROT 12.6  INR 1.0   ABG No results for input(s): PHART, HCO3 in the last 72 hours.  Invalid input(s): PCO2, PO2  Studies/Results: No results found.  Anti-infectives: Anti-infectives (From admission, onward)   Start     Dose/Rate Route Frequency Ordered Stop   03/09/20 2200  ciprofloxacin (CIPRO) IVPB 400 mg     400 mg 200 mL/hr over 60 Minutes Intravenous Every 12 hours 03/09/20 1344 03/09/20 2216   03/09/20 0730  ciprofloxacin (CIPRO) IVPB 400 mg     400 mg 200 mL/hr over 60 Minutes Intravenous On call to O.R. 03/09/20 0723 03/09/20 1034      Assessment/Plan: s/p Procedure(s) with comments: LEFT MODIFIED RADICAL MASTECTOMY (Left) - GEN AND PECTORAL BLOCK Chronic anemia - multifactorial, chemo related and chronic kidney disease.   ABL anemia Chronic kidney disease with presumed acute kidney injury H/o DVT.  Restart coumadin.  Concerned about restarting lovenox given kidney function.  Discussed with pharmacy and will convert to daily dosing instead of BID dosing.  Keep overnight to make sure we are going back down to baseline.  Bolus NS and maintenance IV fluid. Remove K from IV fluids and d/c lasix and potassium    LOS: 0 days    Stark Klein 03/10/2020

## 2020-03-11 LAB — CBC
HCT: 26.7 % — ABNORMAL LOW (ref 36.0–46.0)
Hemoglobin: 7.8 g/dL — ABNORMAL LOW (ref 12.0–15.0)
MCH: 27.1 pg (ref 26.0–34.0)
MCHC: 29.2 g/dL — ABNORMAL LOW (ref 30.0–36.0)
MCV: 92.7 fL (ref 80.0–100.0)
Platelets: 163 10*3/uL (ref 150–400)
RBC: 2.88 MIL/uL — ABNORMAL LOW (ref 3.87–5.11)
RDW: 16.6 % — ABNORMAL HIGH (ref 11.5–15.5)
WBC: 9.5 10*3/uL (ref 4.0–10.5)
nRBC: 0 % (ref 0.0–0.2)

## 2020-03-11 LAB — BASIC METABOLIC PANEL
Anion gap: 8 (ref 5–15)
BUN: 32 mg/dL — ABNORMAL HIGH (ref 8–23)
CO2: 21 mmol/L — ABNORMAL LOW (ref 22–32)
Calcium: 7.9 mg/dL — ABNORMAL LOW (ref 8.9–10.3)
Chloride: 111 mmol/L (ref 98–111)
Creatinine, Ser: 2.61 mg/dL — ABNORMAL HIGH (ref 0.44–1.00)
GFR calc Af Amer: 21 mL/min — ABNORMAL LOW (ref >60)
GFR calc non Af Amer: 18 mL/min — ABNORMAL LOW (ref >60)
Glucose, Bld: 126 mg/dL — ABNORMAL HIGH (ref 70–99)
Potassium: 4.1 mmol/L (ref 3.5–5.1)
Sodium: 140 mmol/L (ref 135–145)

## 2020-03-11 LAB — SURGICAL PATHOLOGY

## 2020-03-11 MED ORDER — ONDANSETRON 4 MG PO TBDP: 4.0000 mg | ORAL_TABLET | Freq: Four times a day (QID) | ORAL | 0 refills | Status: DC | PRN

## 2020-03-11 MED ORDER — ENOXAPARIN SODIUM 80 MG/0.8ML ~~LOC~~ SOLN: 80.0000 mg | SUBCUTANEOUS | 0 refills | Status: DC

## 2020-03-11 MED ORDER — TRAMADOL HCL 50 MG PO TABS: 50.0000 mg | ORAL_TABLET | Freq: Four times a day (QID) | ORAL | 0 refills | Status: DC | PRN

## 2020-03-11 MED ORDER — METHOCARBAMOL 500 MG PO TABS: 500.0000 mg | ORAL_TABLET | Freq: Four times a day (QID) | ORAL | 1 refills | Status: DC | PRN

## 2020-03-11 NOTE — Plan of Care (Signed)
  Problem: Activity: Goal: Risk for activity intolerance will decrease Outcome: Adequate for Discharge   Problem: Nutrition: Goal: Adequate nutrition will be maintained Outcome: Adequate for Discharge   Problem: Elimination: Goal: Will not experience complications related to bowel motility Outcome: Adequate for Discharge   Problem: Pain Managment: Goal: General experience of comfort will improve Outcome: Adequate for Discharge   

## 2020-03-11 NOTE — Progress Notes (Signed)
Patient discharged to home with instructions and supplies.

## 2020-03-11 NOTE — Discharge Summary (Signed)
Physician Discharge Summary  Patient ID: [name] Foster MRN: [id number] DOB/AGE: [id number] y.o.  Admit date: 03/09/2020 Discharge date: 03/11/2020  Admission Diagnoses:\ Patient Active Problem List   Diagnosis Date Noted  . Breast cancer metastasized to axillary lymph node, left ([place]) 03/09/2020  . Dehydration   . Non-intractable vomiting   . Intractable nausea and vomiting 09/21/2019  . Generalized weakness 09/21/2019  . AKI (acute kidney injury) ([place]) 09/21/2019  . Leukocytosis 09/21/2019  . Hypernatremia 09/21/2019  . Hyperchloremia 09/21/2019  . Thrombocytopenia ([place]) 09/21/2019  . Port-A-Cath in place 09/08/2019  . Family history of breast cancer   . Family history of throat cancer   . Family history of cervical cancer   . Malignant neoplasm of overlapping sites of left breast in female, estrogen receptor negative ([place]) 08/01/2019  . Family history of colon cancer mom 11/05/2018  . Long term (current) use of anticoagulants 09/17/2017  . Encounter for therapeutic drug monitoring 11/21/2013  . Asthma with bronchitis 09/01/2013  . Chest pain 01/01/2013  . Aortic insufficiency 10/25/2011  . Chronic anticoagulation 09/29/2011  . Nocturnal dyspnea 09/29/2011  . DVT (deep venous thrombosis) ([place]) 11/17/2010  . VITAMIN D DEFICIENCY 08/06/2009  . DVT 01/26/2009  . KNEE PAIN, RIGHT 06/19/2008  . EDEMA 06/19/2008  . ANKLE PAIN, LEFT 02/13/2008  . HYPERKALEMIA 08/20/2007  . HYPOKALEMIA 08/20/2007  . HLD (hyperlipidemia) 07/18/2007  . Cardiomyopathy, hypertensive ([place]) 07/18/2007  . Essential hypertension 06/26/2007  . Hypertensive heart disease with heart failure ([place]) 06/26/2007  . Congestive heart failure ([place]) 06/26/2007  . Allergic rhinitis, cause unspecified 06/26/2007  . ASTHMA 06/26/2007  . SYMPTOM, SYNCOPE AND COLLAPSE 06/26/2007  . HEADACHE 06/26/2007  . HEART MURMUR, HX OF 06/26/2007  . DVT, HX OF 06/26/2007     Discharge Diagnoses:  Active Problems:    Breast cancer metastasized to axillary lymph node, left (H[facility]) Chronic kidney disease with acute kidney injury Anticoagulation.    Discharged Condition: stable  Hospital Course: Pt was admitted to the floor following left modified radical mastectomy 03/09/2020.  She did not have a significant blood loss.  The next morning, she was clinically well, ambulatory with walker, and tolerating a diet.  However, she had a significant bump in her creatinine, and her potassium was starting to creep up.  She has been on full dose lovenox for coumadin bridging, and I was concerned about supratherapeutic anticoagulation given her renal insufficiency.  She was kept for another day for hydration and observation.  Her creatinine started to go back down, but just a bit.  I converted her to single daily dose of lovenox rather than BID.  She was given a dose of lovenox the day before discharge and did not have any bleeding issues.    Consults: None  Significant Diagnostic Studies: labs: Cr 2.61 down from 2.76.  Baseline 1.7-1.9. HCT 26.7 prior to d/c.  Baseline 32-34  Treatments: surgery: see above and hydration  Discharge Exam: Blood pressure (!) 113/57, pulse (!) 54, temperature 97.6 F (36.4 C), temperature source Oral, resp. rate 16, height 5\' 6"  (1.676 m), weight 88.5 kg, SpO2 98 %. General appearance: alert, cooperative and no distress Resp: breathing comfortably Chest wall: anticipated left sided tenderness.  no flap hematoma.  drains serosang.    Disposition: Discharge disposition: 01-Home or Self Care       Discharge Instructions    Call MD for:  difficulty breathing, headache or visual disturbances   Complete by: As directed    Call MD for:  hives   Complete by: As directed    Call MD for:  persistant nausea and vomiting   Complete by: As directed    Call MD for:  redness, tenderness, or signs of infection (pain, swelling, redness, odor or green/yellow discharge around incision site)   Complete  by: As directed    Call MD for:  severe uncontrolled pain   Complete by: As directed    Call MD for:  temperature >100.4   Complete by: As directed    Diet - low sodium heart healthy   Complete by: As directed    Increase activity slowly   Complete by: As directed      Allergies as of 03/11/2020      Reactions   Penicillins Rash   Did it involve swelling of the face/tongue/throat, SOB, or low BP? Unknown Did it involve sudden or severe rash/hives, skin peeling, or any reaction on the inside of your mouth or nose? Yes Did you need to seek medical attention at a hospital or doctor's office? Yes When did it last happen?in her 68s If all above answers are "NO", may proceed with cephalosporin use.   Tetanus Toxoid Rash   Caused cellulitis    Aspirin Rash      Medication List    STOP taking these medications   furosemide 40 MG tablet Commonly known as: LASIX   potassium chloride SA 20 MEQ tablet Commonly known as: KLOR-CON     TAKE these medications   Albuterol Sulfate 108 (90 Base) MCG/ACT Aepb Commonly known as: ProAir RespiClick Inhale 2 puffs into the lungs every 6 (six) hours as needed. What changed: reasons to take this   amLODipine 10 MG tablet Commonly known as: NORVASC TAKE 1 TABLET BY MOUTH  DAILY   atorvastatin 20 MG tablet Commonly known as: LIPITOR TAKE 1 TABLET BY MOUTH  DAILY   Clear Eyes for Dry Eyes 1-0.25 % Soln Generic drug: Carboxymethylcellul-Glycerin Place 1 drop into both eyes 2 (two) times daily as needed (dry eyes).   enoxaparin 80 MG/0.8ML injection Commonly known as: Lovenox Inject 0.8 mLs (80 mg total) into the skin daily for 11 days. To begin on 03/04/2020 What changed: when to take this   fexofenadine 180 MG tablet Commonly known as: ALLEGRA Take 180 mg by mouth daily as needed for allergies.   fluticasone 50 MCG/ACT nasal spray Commonly known as: FLONASE USE 2 SPRAYS IN EACH NOSTRIL DAILY. PT NEEDS TO SCHEDULE A FOLLOW UP  APPT BEFORE NEXT REFILL. What changed: See the new instructions.   hydrALAZINE 50 MG tablet Commonly known as: APRESOLINE Take 0.5 tablets (25 mg total) by mouth 3 (three) times daily.   lidocaine-prilocaine cream Commonly known as: EMLA Apply 1 application topically as directed. To affected area once   methocarbamol 500 MG tablet Commonly known as: ROBAXIN Take 1 tablet (500 mg total) by mouth every 6 (six) hours as needed for muscle spasms.   metoprolol tartrate 50 MG tablet Commonly known as: LOPRESSOR TAKE 1 TABLET BY MOUTH  TWICE DAILY   ondansetron 4 MG disintegrating tablet Commonly known as: ZOFRAN-ODT Take 1 tablet (4 mg total) by mouth every 6 (six) hours as needed for nausea.   sodium chloride 0.65 % Soln nasal spray Commonly known as: OCEAN Place 1 spray into both nostrils as needed for congestion.   traMADol 50 MG tablet Commonly known as: ULTRAM Take 1 tablet (50 mg total) by mouth every 6 (six) hours as needed (mild pain).  Vitamin D 50 MCG (2000 UT) tablet Take 2,000 Units by mouth daily.   warfarin 5 MG tablet Commonly known as: COUMADIN Take as directed. If you are unsure how to take this medication, talk to your nurse or doctor. Original instructions: TAKE 1 TABLET BY MOUTH  DAILY OR AS DIRECTED BY  ANTICOAGULATION CLINIC       Follow-up Information    Stark Klein, MD In 2 weeks.   Specialty: General Surgery Contact information: [address] [place] [address] [phone]               Signed: Stark Klein 03/11/2020, 7:38 AM

## 2020-03-12 ENCOUNTER — Telehealth: Payer: Self-pay | Admitting: Hematology and Oncology

## 2020-03-12 ENCOUNTER — Other Ambulatory Visit: Payer: Self-pay | Admitting: *Deleted

## 2020-03-12 DIAGNOSIS — Z5181 Encounter for therapeutic drug level monitoring: Secondary | ICD-10-CM

## 2020-03-12 NOTE — Telephone Encounter (Signed)
Called pt per 6/11 sch message - no answer. Left message on mobile phone with appt date and time.   Unable to reach pt on home phone and unable to leave message.

## 2020-03-15 ENCOUNTER — Encounter: Payer: Medicare Other | Admitting: Medical

## 2020-03-15 ENCOUNTER — Encounter: Payer: Self-pay | Admitting: *Deleted

## 2020-03-15 ENCOUNTER — Inpatient Hospital Stay: Payer: Medicare Other

## 2020-03-15 ENCOUNTER — Telehealth: Payer: Self-pay | Admitting: Emergency Medicine

## 2020-03-15 ENCOUNTER — Other Ambulatory Visit: Payer: Medicare Other

## 2020-03-15 NOTE — Telephone Encounter (Signed)
Called pt regarding [facility] appt today (f/u from hospital d/c), no answer.  Left VM requesting pt call back to discuss rescheduling f/u appt and to update Gudena's office on her status.

## 2020-03-17 ENCOUNTER — Telehealth: Payer: Self-pay | Admitting: Medical

## 2020-03-17 NOTE — Telephone Encounter (Signed)
Patient no showed for 03/15/2020 appt for labs. Called yesterday and again today. Left message as patient did not pick-up call.   Sandi Mealy, MHS, PA-C

## 2020-03-19 NOTE — Progress Notes (Signed)
Pharmacist Chemotherapy Monitoring - Follow Up Assessment    I verify that I have reviewed each item in the below checklist:  . Regimen for the patient is scheduled for the appropriate day and plan matches scheduled date. Marland Kitchen Appropriate non-routine labs are ordered dependent on drug ordered. . If applicable, additional medications reviewed and ordered per protocol based on lifetime cumulative doses and/or treatment regimen.   Plan for follow-up and/or issues identified: No . I-vent associated with next due treatment: No . MD and/or nursing notified: No  [name] Foster 03/19/2020 2:20 PM

## 2020-03-24 ENCOUNTER — Telehealth: Payer: Self-pay | Admitting: Hematology and Oncology

## 2020-03-24 NOTE — Telephone Encounter (Signed)
Called per 6/23 sch message- no answer. Left message for patient to call back to reschedule.

## 2020-03-25 ENCOUNTER — Inpatient Hospital Stay: Payer: Medicare Other | Admitting: Hematology and Oncology

## 2020-03-25 ENCOUNTER — Inpatient Hospital Stay: Payer: Medicare Other

## 2020-03-25 NOTE — Assessment & Plan Note (Deleted)
08/01/2019:Patient palpated left breast lump and skin thickening. Mammogram showed a 3.6cm mass at 3:30 position, a 0.9cm mass at 3:00 position, a 0.7cm mass at 2:00 position, a 0.5cm mass at 2:00 position, a 0.3cm mass at 1:00 position, and a 0.4cm mass at 1:00 position, with 4 abnormal left axillary lymph nodes. Biopsy showed IDC, grade 3, HER-2 + (3+), ER/PR -, Ki67 70%, in the breast and lymph nodes.  Treatment plan 1. Neoadjuvant chemotherapy with TCH Perjeta 6 cycles followed by Herceptinmaintenance versus Kadcyla maintenance (depending on response to chemo)for 1 year, Completed 01/02/2020 2. Followed bymastectomy withaxillary lymph node dissection 3. Followed by adjuvant radiation therapy  CT CAP 08/12/2019: 3.9 cm left breast mass with left axillary lymph nodes. 10 mm lesion upper pole of left kidney suspicious for small renal cell cancer. 3 mm left lower lobe nodule, 4 mm subcapsular lesion anterior hepatic dome too small Bone scan 08/15/2019: Benign Breast MRI 08/12/2019: Locally advanced left breast cancer 4 cm, multiple irregular enhancing masses, diffuse skin thickening and enhancement and multiple pathogenic level 1 axillary lymph nodes at least 5-6 Breast MRI post neoadjuvant chemo: Complete imaging response in the tumor and the lymph node -------------------------------------------------------------------------------------------------------------------------------------------------- Current treatment:Herceptin maintenance Chemo toxicities: Tolerating herceptin well  INR: 1.8: On Coumadin 5 mg daily Chronic renal failure:Being monitored closely.Okay to treat

## 2020-03-29 ENCOUNTER — Telehealth: Payer: Self-pay

## 2020-03-29 NOTE — Telephone Encounter (Signed)
Warden Fillers, RN  Randall An, RN Phone Number: [phone]  Diannia Ruder,   Patient is having surgery today. She has been having chemo for breast ca. She is a patient of Dr. Regis Bill at [facility] office but she lives in [place]. Oncology has been following her while on chemo but when she is released she will probably be coming to the [facility] office for INR checks.    Per Jenny Reichmann, RN, pt has had coumadin managed by oncology since Nov 2020.  LVM for pt to call to make apt at [facility]

## 2020-04-12 NOTE — Telephone Encounter (Signed)
LVM

## 2020-04-16 ENCOUNTER — Other Ambulatory Visit: Payer: Self-pay

## 2020-04-16 ENCOUNTER — Inpatient Hospital Stay (HOSPITAL_BASED_OUTPATIENT_CLINIC_OR_DEPARTMENT_OTHER): Payer: Medicare Other | Admitting: Hematology and Oncology

## 2020-04-16 ENCOUNTER — Other Ambulatory Visit: Payer: Self-pay | Admitting: *Deleted

## 2020-04-16 ENCOUNTER — Inpatient Hospital Stay: Payer: Medicare Other | Attending: Hematology and Oncology

## 2020-04-16 ENCOUNTER — Inpatient Hospital Stay: Payer: Medicare Other

## 2020-04-16 ENCOUNTER — Encounter: Payer: Self-pay | Admitting: *Deleted

## 2020-04-16 DIAGNOSIS — C50812 Malignant neoplasm of overlapping sites of left female breast: Secondary | ICD-10-CM | POA: Insufficient documentation

## 2020-04-16 DIAGNOSIS — Z171 Estrogen receptor negative status [ER-]: Secondary | ICD-10-CM

## 2020-04-16 DIAGNOSIS — Z7901 Long term (current) use of anticoagulants: Secondary | ICD-10-CM | POA: Insufficient documentation

## 2020-04-16 DIAGNOSIS — Z5112 Encounter for antineoplastic immunotherapy: Secondary | ICD-10-CM | POA: Insufficient documentation

## 2020-04-16 DIAGNOSIS — Z5181 Encounter for therapeutic drug level monitoring: Secondary | ICD-10-CM

## 2020-04-16 DIAGNOSIS — Z95828 Presence of other vascular implants and grafts: Secondary | ICD-10-CM

## 2020-04-16 LAB — CBC WITH DIFFERENTIAL (CANCER CENTER ONLY)
Abs Immature Granulocytes: 0.02 10*3/uL (ref 0.00–0.07)
Basophils Absolute: 0 10*3/uL (ref 0.0–0.1)
Basophils Relative: 0 %
Eosinophils Absolute: 0.1 10*3/uL (ref 0.0–0.5)
Eosinophils Relative: 1 %
HCT: 32.7 % — ABNORMAL LOW (ref 36.0–46.0)
Hemoglobin: 10.1 g/dL — ABNORMAL LOW (ref 12.0–15.0)
Immature Granulocytes: 0 %
Lymphocytes Relative: 25 %
Lymphs Abs: 2.1 10*3/uL (ref 0.7–4.0)
MCH: 27.5 pg (ref 26.0–34.0)
MCHC: 30.9 g/dL (ref 30.0–36.0)
MCV: 89.1 fL (ref 80.0–100.0)
Monocytes Absolute: 0.6 10*3/uL (ref 0.1–1.0)
Monocytes Relative: 7 %
Neutro Abs: 5.6 10*3/uL (ref 1.7–7.7)
Neutrophils Relative %: 67 %
Platelet Count: 239 10*3/uL (ref 150–400)
RBC: 3.67 MIL/uL — ABNORMAL LOW (ref 3.87–5.11)
RDW: 15.3 % (ref 11.5–15.5)
WBC Count: 8.4 10*3/uL (ref 4.0–10.5)
nRBC: 0 % (ref 0.0–0.2)

## 2020-04-16 LAB — CMP (CANCER CENTER ONLY)
ALT: 26 U/L (ref 0–44)
AST: 24 U/L (ref 15–41)
Albumin: 3.2 g/dL — ABNORMAL LOW (ref 3.5–5.0)
Alkaline Phosphatase: 122 U/L (ref 38–126)
Anion gap: 11 (ref 5–15)
BUN: 16 mg/dL (ref 8–23)
CO2: 23 mmol/L (ref 22–32)
Calcium: 9.2 mg/dL (ref 8.9–10.3)
Chloride: 110 mmol/L (ref 98–111)
Creatinine: 1.75 mg/dL — ABNORMAL HIGH (ref 0.44–1.00)
GFR, Est AFR Am: 34 mL/min — ABNORMAL LOW (ref >60)
GFR, Estimated: 30 mL/min — ABNORMAL LOW (ref >60)
Glucose, Bld: 98 mg/dL (ref 70–99)
Potassium: 3.8 mmol/L (ref 3.5–5.1)
Sodium: 144 mmol/L (ref 135–145)
Total Bilirubin: 0.3 mg/dL (ref 0.3–1.2)
Total Protein: 7.1 g/dL (ref 6.5–8.1)

## 2020-04-16 LAB — PROTIME-INR
INR: 2.3 — ABNORMAL HIGH (ref 0.8–1.2)
Prothrombin Time: 24.9 seconds — ABNORMAL HIGH (ref 11.4–15.2)

## 2020-04-16 MED ORDER — HEPARIN SOD (PORK) LOCK FLUSH 100 UNIT/ML IV SOLN
500.0000 [IU] | Freq: Once | INTRAVENOUS | Status: AC | PRN
  Administered 2020-04-16: 500 [IU]
  Filled 2020-04-16: qty 5

## 2020-04-16 MED ORDER — SODIUM CHLORIDE 0.9% FLUSH
10.0000 mL | INTRAVENOUS | Status: DC | PRN
  Administered 2020-04-16: 10 mL
  Filled 2020-04-16: qty 10

## 2020-04-16 MED ORDER — TRASTUZUMAB-ANNS CHEMO 150 MG IV SOLR
6.0000 mg/kg | Freq: Once | INTRAVENOUS | Status: AC
  Administered 2020-04-16: 504 mg via INTRAVENOUS
  Filled 2020-04-16: qty 24

## 2020-04-16 MED ORDER — DIPHENHYDRAMINE HCL 25 MG PO CAPS
50.0000 mg | ORAL_CAPSULE | Freq: Once | ORAL | Status: AC
  Administered 2020-04-16: 50 mg via ORAL

## 2020-04-16 MED ORDER — DIPHENHYDRAMINE HCL 25 MG PO CAPS
ORAL_CAPSULE | ORAL | Status: AC
  Filled 2020-04-16: qty 2

## 2020-04-16 MED ORDER — SODIUM CHLORIDE 0.9 % IV SOLN
Freq: Once | INTRAVENOUS | Status: AC
  Filled 2020-04-16: qty 250

## 2020-04-16 MED ORDER — ACETAMINOPHEN 325 MG PO TABS
650.0000 mg | ORAL_TABLET | Freq: Once | ORAL | Status: AC
  Administered 2020-04-16: 650 mg via ORAL

## 2020-04-16 MED ORDER — ACETAMINOPHEN 325 MG PO TABS
ORAL_TABLET | ORAL | Status: AC
  Filled 2020-04-16: qty 2

## 2020-04-16 NOTE — Progress Notes (Signed)
Pt last received Trastuzumab on 03/05/20.   No reload for Trastuzumab today per Dr. Lindi Adie.  Kennith Center, Pharm.D., CPP 04/16/2020@11 :19 AM

## 2020-04-16 NOTE — Progress Notes (Signed)
Patient Care Team: Panosh, Standley Brooking, MD as PCP - General Stanford Breed, Denice Bors, MD as PCP - Cardiology (Cardiology) Stanford Breed Denice Bors, MD (Cardiology) Susa Day, MD (Orthopedic Surgery) Mauro Kaufmann, RN as Oncology Nurse Navigator Rockwell Germany, RN as Oncology Nurse Navigator  DIAGNOSIS:    ICD-10-CM   1. Malignant neoplasm of overlapping sites of left breast in female, estrogen receptor negative [facility])  C50.812 Ambulatory referral to Physical Therapy   Z17.1     SUMMARY OF ONCOLOGIC HISTORY: Oncology History  Malignant neoplasm of overlapping sites of left breast in female, estrogen receptor negative ([place])  08/01/2019 Initial Diagnosis   Patient palpated left breast lump and skin thickening. Mammogram showed a 3.6cm mass at 3:30 position, a 0.9cm mass at 3:00 position, a 0.7cm mass at 2:00 position, a 0.5cm mass at 2:00 position, a 0.3cm mass at 1:00 position, and a 0.4cm mass at 1:00 position, with 4 abnormal left axillary lymph nodes. Biopsy showed IDC, grade 3, HER-2 + (3+), ER/PR -, Ki67 70%, in the breast and lymph nodes.    08/06/2019 Cancer Staging   Staging form: Breast, AJCC 8th Edition - Clinical: Stage IIIB (cT4, cN1, cM0, G3, ER-, PR-, HER2+) - Signed by Nicholas Lose, MD on 08/06/2019   08/19/2019 - 01/22/2020 Chemotherapy   The patient had palonosetron (ALOXI) injection 0.25 mg, 0.25 mg, Intravenous,  Once, 6 of 6 cycles Administration: 0.25 mg (08/19/2019), 0.25 mg (09/08/2019), 0.25 mg (12/12/2019), 0.25 mg (01/02/2020), 0.25 mg (10/31/2019), 0.25 mg (11/22/2019) pegfilgrastim-cbqv (UDENYCA) injection 6 mg, 6 mg, Subcutaneous, Once, 2 of 2 cycles Administration: 6 mg (08/21/2019), 6 mg (09/10/2019) CARBOplatin (PARAPLATIN) 480 mg in sodium chloride 0.9 % 250 mL chemo infusion, 480 mg (110.9 % of original dose 429.6 mg), Intravenous,  Once, 2 of 2 cycles Dose modification:   (original dose 429.6 mg, Cycle 1) Administration: 480 mg (08/19/2019), 340 mg  (09/08/2019) DOCEtaxel (TAXOTERE) 160 mg in sodium chloride 0.9 % 250 mL chemo infusion, 75 mg/m2 = 160 mg, Intravenous,  Once, 6 of 6 cycles Dose modification: 60 mg/m2 (original dose 75 mg/m2, Cycle 2, Reason: Dose not tolerated), 50 mg/m2 (original dose 75 mg/m2, Cycle 5, Reason: Dose not tolerated) Administration: 160 mg (08/19/2019), 130 mg (09/08/2019), 110 mg (12/12/2019), 110 mg (01/02/2020), 110 mg (10/31/2019), 110 mg (11/22/2019) pertuzumab (PERJETA) 420 mg in sodium chloride 0.9 % 250 mL chemo infusion, 420 mg (100 % of original dose 420 mg), Intravenous, Once, 2 of 2 cycles Dose modification: 420 mg (original dose 420 mg, Cycle 1, Reason: Provider Judgment) Administration: 420 mg (08/19/2019), 420 mg (09/08/2019) fosaprepitant (EMEND) 150 mg, dexamethasone (DECADRON) 12 mg in sodium chloride 0.9 % 145 mL IVPB, , Intravenous,  Once, 3 of 3 cycles Administration:  (08/19/2019),  (09/08/2019),  (10/31/2019) trastuzumab-anns (KANJINTI) 750 mg in sodium chloride 0.9 % 250 mL chemo infusion, 756 mg (100 % of original dose 8 mg/kg), Intravenous,  Once, 6 of 6 cycles Dose modification: 8 mg/kg (original dose 8 mg/kg, Cycle 1, Reason: Other (see comments), Comment: change to approved brand), 6 mg/kg (original dose 6 mg/kg, Cycle 2, Reason: Other (see comments), Comment: brand change per insurance) Administration: 750 mg (08/19/2019), 567 mg (09/08/2019), 567 mg (10/31/2019), 567 mg (11/22/2019), 567 mg (12/12/2019), 504 mg (01/02/2020)  for chemotherapy treatment.    09/21/2019 - 10/03/2019 Hospital Admission   Intractable nausea and vomiting   01/23/2020 -  Chemotherapy   The patient had trastuzumab-anns (KANJINTI) 504 mg in sodium chloride 0.9 % 250 mL  chemo infusion, 6 mg/kg = 504 mg (100 % of original dose 6 mg/kg), Intravenous,  Once, 3 of 10 cycles Dose modification: 6 mg/kg (original dose 6 mg/kg, Cycle 1, Reason: Other (see comments), Comment: biosimilar conversion; auth'd for Kanjinti) Administration:  504 mg (01/23/2020), 504 mg (02/13/2020), 504 mg (03/05/2020)  for chemotherapy treatment.    03/09/2020 Surgery   Left mastectomy and left axillary lymph node dissection ([place]): no residual carcinoma, 2 negative intramammary lymph nodes, and 19 negative left axillary lymph nodes.      CHIEF COMPLIANT: Follow-up of left breast cancer s/p mastectomy  INTERVAL HISTORY: [name] Foster is a 69 y.o. with above-mentioned history of left breast cancer who completed neoadjuvant chemotherapywith TCHP. She underwent a left mastectomy and left axillary lymph node dissection on 03/09/20 with Dr. Barry Dienes for which pathology showed no residual carcinoma in the breast or 19 axillary lymph nodes.  She presents to the clinic todayto discuss the pathology report and further treatment.  Her pain is improving over time.  The drains have been removed.  She has an appointment to see Dr. Barry Dienes next week.  She has started developing lymphedema on the left arm.  ALLERGIES:  is allergic to penicillins, tetanus toxoid, and aspirin.  MEDICATIONS:  Current Outpatient Medications  Medication Sig Dispense Refill  . Albuterol Sulfate (PROAIR RESPICLICK) 845 (90 Base) MCG/ACT AEPB Inhale 2 puffs into the lungs every 6 (six) hours as needed. (Patient taking differently: Inhale 2 puffs into the lungs every 6 (six) hours as needed (SOB / wheezing). ) 2 each 1  . amLODipine (NORVASC) 10 MG tablet TAKE 1 TABLET BY MOUTH  DAILY (Patient taking differently: Take 10 mg by mouth daily. ) 90 tablet 3  . atorvastatin (LIPITOR) 20 MG tablet TAKE 1 TABLET BY MOUTH  DAILY 90 tablet 1  . Carboxymethylcellul-Glycerin (CLEAR EYES FOR DRY EYES) 1-0.25 % SOLN Place 1 drop into both eyes 2 (two) times daily as needed (dry eyes).    . Cholecalciferol (VITAMIN D) 50 MCG (2000 UT) tablet Take 2,000 Units by mouth daily.    Marland Kitchen enoxaparin (LOVENOX) 80 MG/0.8ML injection Inject 0.8 mLs (80 mg total) into the skin daily for 11 days. To begin on 03/04/2020 8.8  mL 0  . fexofenadine (ALLEGRA) 180 MG tablet Take 180 mg by mouth daily as needed for allergies.     . fluticasone (FLONASE) 50 MCG/ACT nasal spray USE 2 SPRAYS IN EACH NOSTRIL DAILY. PT NEEDS TO SCHEDULE A FOLLOW UP APPT BEFORE NEXT REFILL. (Patient taking differently: Place 2 sprays into both nostrils daily. ) 16 g 0  . hydrALAZINE (APRESOLINE) 50 MG tablet Take 0.5 tablets (25 mg total) by mouth 3 (three) times daily. 1 tablet 0  . lidocaine-prilocaine (EMLA) cream Apply 1 application topically as directed. To affected area once    . methocarbamol (ROBAXIN) 500 MG tablet Take 1 tablet (500 mg total) by mouth every 6 (six) hours as needed for muscle spasms. 20 tablet 1  . metoprolol tartrate (LOPRESSOR) 50 MG tablet TAKE 1 TABLET BY MOUTH  TWICE DAILY 180 tablet 1  . ondansetron (ZOFRAN-ODT) 4 MG disintegrating tablet Take 1 tablet (4 mg total) by mouth every 6 (six) hours as needed for nausea. 20 tablet 0  . sodium chloride (OCEAN) 0.65 % SOLN nasal spray Place 1 spray into both nostrils as needed for congestion.    . traMADol (ULTRAM) 50 MG tablet Take 1 tablet (50 mg total) by mouth every 6 (six) hours as needed (  mild pain). 20 tablet 0  . warfarin (COUMADIN) 5 MG tablet TAKE 1 TABLET BY MOUTH  DAILY OR AS DIRECTED BY  ANTICOAGULATION CLINIC 100 tablet 1   No current facility-administered medications for this visit.    PHYSICAL EXAMINATION: ECOG PERFORMANCE STATUS: 1 - Symptomatic but completely ambulatory  Vitals:   04/16/20 1041  BP: 136/71  Pulse: 71  Resp: 17  Temp: 98 F (36.7 C)  SpO2: 99%   Filed Weights   04/16/20 1041  Weight: 193 lb 1.6 oz (87.6 kg)    LABORATORY DATA:  I have reviewed the data as listed CMP Latest Ref Rng & Units 03/11/2020 03/10/2020 02/25/2020  Glucose 70 - 99 mg/dL 126(H) 142(H) 93  BUN 8 - 23 mg/dL 32(H) 22 17  Creatinine 0.44 - 1.00 mg/dL 2.61(H) 2.76(H) 1.78(H)  Sodium 135 - 145 mmol/L 140 139 144  Potassium 3.5 - 5.1 mmol/L 4.1 4.5 3.6   Chloride 98 - 111 mmol/L 111 108 107  CO2 22 - 32 mmol/L 21(L) 22 28  Calcium 8.9 - 10.3 mg/dL 7.9(L) 8.4(L) 9.1  Total Protein 6.5 - 8.1 g/dL - - -  Total Bilirubin 0.3 - 1.2 mg/dL - - -  Alkaline Phos 38 - 126 U/L - - -  AST 15 - 41 U/L - - -  ALT 0 - 44 U/L - - -    Lab Results  Component Value Date   WBC 8.4 04/16/2020   HGB 10.1 (L) 04/16/2020   HCT 32.7 (L) 04/16/2020   MCV 89.1 04/16/2020   PLT 239 04/16/2020   NEUTROABS 5.6 04/16/2020    ASSESSMENT & PLAN:  Malignant neoplasm of overlapping sites of left breast in female, estrogen receptor negative ([place]) 07/31/2020:Patient palpated left breast lump and skin thickening. Mammogram showed a 3.6cm mass at 3:30 position, a 0.9cm mass at 3:00 position, a 0.7cm mass at 2:00 position, a 0.5cm mass at 2:00 position, a 0.3cm mass at 1:00 position, and a 0.4cm mass at 1:00 position, with 4 abnormal left axillary lymph nodes. Biopsy showed IDC, grade 3, HER-2 + (3+), ER/PR -, Ki67 70%, in the breast and lymph nodes.   Treatment plan 1. Neoadjuvant chemotherapy with TCH Perjeta 6 cycles followed by Herceptinmaintenance versus Kadcyla maintenance (depending on response to chemo)for 1 year, Completed 01/02/2020 2. Followed bymastectomy withaxillary lymph node dissection 3. Followed by adjuvant radiation therapy  03/09/2020: Left mastectomy and left axillary lymph node dissection ([place]): no residual carcinoma, 2 negative intramammary lymph nodes, and 19 negative left axillary lymph nodes.  --------------------------------------------------------------------------------------------------------------------------------------------- Pathology counseling: I discussed the final pathology report of the patient provided  a copy of this report. I discussed the margins as well as lymph node surgeries. We also discussed the final staging along with previously performed ER/PR and HER-2/neu testing.  Patient had a complete response to  chemotherapy which is phenomenal considering the number of lymph nodes involved and the extent of her disease.  Treatment: She will continue with maintenance Herceptin.  Radiation will also be pursued. Return to clinic every 3 weeks for Herceptin every 6 weeks to follow-up with [place].  This will be completed 08/18/2020. Left arm lymphedema: Physical therapy consult has been placed.  She will need sleeve.   Orders Placed This Encounter  Procedures  . Ambulatory referral to Physical Therapy    Referral Priority:   Routine    Referral Type:   Physical Medicine    Referral Reason:   Specialty Services Required    Requested Specialty:  Physical Therapy    Number of Visits Requested:   1   The patient has a good understanding of the overall plan. she agrees with it. she will call with any problems that may develop before the next visit here.  Total time spent: 30 mins including face to face time and time spent for planning, charting and coordination of care  Nicholas Lose, MD 04/16/2020  I, Cloyde Reams Dorshimer, am acting as scribe for Dr. Nicholas Lose.  I have reviewed the above documentation for accuracy and completeness, and I agree with the above.

## 2020-04-16 NOTE — Patient Instructions (Signed)

## 2020-04-16 NOTE — Assessment & Plan Note (Signed)
07/31/2020:Patient palpated left breast lump and skin thickening. Mammogram showed a 3.6cm mass at 3:30 position, a 0.9cm mass at 3:00 position, a 0.7cm mass at 2:00 position, a 0.5cm mass at 2:00 position, a 0.3cm mass at 1:00 position, and a 0.4cm mass at 1:00 position, with 4 abnormal left axillary lymph nodes. Biopsy showed IDC, grade 3, HER-2 + (3+), ER/PR -, Ki67 70%, in the breast and lymph nodes.   Treatment plan 1. Neoadjuvant chemotherapy with TCH Perjeta 6 cycles followed by Herceptinmaintenance versus Kadcyla maintenance (depending on response to chemo)for 1 year, Completed 01/02/2020 2. Followed bymastectomy withaxillary lymph node dissection 3. Followed by adjuvant radiation therapy  03/09/2020: Left mastectomy and left axillary lymph node dissection ([place]): no residual carcinoma, 2 negative intramammary lymph nodes, and 19 negative left axillary lymph nodes.  --------------------------------------------------------------------------------------------------------------------------------------------- Pathology counseling: I discussed the final pathology report of the patient provided  a copy of this report. I discussed the margins as well as lymph node surgeries. We also discussed the final staging along with previously performed ER/PR and HER-2/neu testing.  Patient had a complete response to chemotherapy which is phenomenal considering the number of lymph nodes involved and the extent of her disease.  Treatment: She will continue with maintenance Herceptin.  Radiation will also be pursued. Return to clinic every 3 weeks for Herceptin every 6 weeks to follow-up with [place].

## 2020-04-16 NOTE — Patient Instructions (Signed)
[place] Discharge Instructions for Patients Receiving Chemotherapy  Today you received the following Immunotherapy agent: Trastuzumab  To help prevent nausea and vomiting after your treatment, we encourage you to take your nausea medication as directed by your MD.   If you develop nausea and vomiting that is not controlled by your nausea medication, call the clinic.   BELOW ARE SYMPTOMS THAT SHOULD BE REPORTED IMMEDIATELY:  *FEVER GREATER THAN 100.5 F  *CHILLS WITH OR WITHOUT FEVER  NAUSEA AND VOMITING THAT IS NOT CONTROLLED WITH YOUR NAUSEA MEDICATION  *UNUSUAL SHORTNESS OF BREATH  *UNUSUAL BRUISING OR BLEEDING  TENDERNESS IN MOUTH AND THROAT WITH OR WITHOUT PRESENCE OF ULCERS  *URINARY PROBLEMS  *BOWEL PROBLEMS  UNUSUAL RASH Items with * indicate a potential emergency and should be followed up as soon as possible.  Feel free to call the clinic should you have any questions or concerns. The clinic phone number is [phone].  Please show the [place] at check-in to the Emergency Department and triage nurse.

## 2020-04-19 ENCOUNTER — Ambulatory Visit: Payer: Self-pay | Admitting: General Practice

## 2020-04-19 ENCOUNTER — Telehealth: Payer: Self-pay | Admitting: Hematology and Oncology

## 2020-04-19 DIAGNOSIS — R6889 Other general symptoms and signs: Secondary | ICD-10-CM | POA: Diagnosis not present

## 2020-04-19 NOTE — Telephone Encounter (Signed)
Scheduled per 7/16 los. Called and left a msg, mailing appt letter and calendar printout

## 2020-04-20 ENCOUNTER — Encounter: Payer: Self-pay | Admitting: *Deleted

## 2020-04-22 ENCOUNTER — Ambulatory Visit: Payer: Medicare Other | Attending: Hematology and Oncology | Admitting: Rehabilitation

## 2020-04-22 ENCOUNTER — Encounter: Payer: Self-pay | Admitting: Rehabilitation

## 2020-04-22 ENCOUNTER — Other Ambulatory Visit: Payer: Self-pay

## 2020-04-22 DIAGNOSIS — R293 Abnormal posture: Secondary | ICD-10-CM | POA: Diagnosis not present

## 2020-04-22 DIAGNOSIS — I890 Lymphedema, not elsewhere classified: Secondary | ICD-10-CM | POA: Diagnosis not present

## 2020-04-22 DIAGNOSIS — Z171 Estrogen receptor negative status [ER-]: Secondary | ICD-10-CM | POA: Insufficient documentation

## 2020-04-22 DIAGNOSIS — C50812 Malignant neoplasm of overlapping sites of left female breast: Secondary | ICD-10-CM | POA: Diagnosis not present

## 2020-04-22 DIAGNOSIS — M25612 Stiffness of left shoulder, not elsewhere classified: Secondary | ICD-10-CM | POA: Insufficient documentation

## 2020-04-22 NOTE — Patient Instructions (Signed)
Call MD office regarding shower; it should be okay now  Supine AAROM punch working towards overhead flexion, pendulums, standing cane flexion and abduction

## 2020-04-22 NOTE — Therapy (Signed)
[place], [place], [address] Phone: [phone]   Fax:  [phone]  Physical Therapy Evaluation  Patient Details  Name: [name] Foster MRN: [id number] Date of Birth: [birth year] Referring Provider (PT): Dr. Lindi Adie   Encounter Date: 04/22/2020   PT End of Session - 04/22/20 1834    Visit Number 1    Number of Visits 13    Date for PT Re-Evaluation 06/03/20    Authorization Type UHC medicare    PT Start Time 0904    PT Stop Time 1000    PT Time Calculation (min) 56 min    Activity Tolerance Patient tolerated treatment well    Behavior During Therapy [facility] for tasks assessed/performed           Past Medical History:  Diagnosis Date  . Allergic rhinitis   . Anemia   . Aortic valve disorders   . Arthritis   . Asthma   . Cancer [facility])    Breast Cancer  . CHF (congestive heart failure) ([place])   . Coronary artery disease   . Family history of adverse reaction to anesthesia    difficulty waking mother up after surgery  . Family history of breast cancer   . Family history of cervical cancer   . Family history of colon cancer   . Family history of throat cancer   . Heart murmur   . Hyperlipidemia   . Hypertension   . Long term (current) use of anticoagulants   . Other primary cardiomyopathies   . Peripheral vascular disease (HCC)    right leg  . Personal history of colonic polyps   . Personal history of venous thrombosis and embolism     Past Surgical History:  Procedure Laterality Date  . ABDOMINAL HYSTERECTOMY    . BREAST BIOPSY Left 07/2019  . CARDIAC CATHETERIZATION    . COLON SURGERY     colonoscopy  . DILATION AND CURETTAGE OF UTERUS     several  . MASTECTOMY MODIFIED RADICAL Left 03/09/2020   Left Modified Radical Mastectomy (left mastectomy with axillary lymph node dissection)   . MASTECTOMY MODIFIED RADICAL Left 03/09/2020   Procedure: LEFT MODIFIED RADICAL MASTECTOMY;  Surgeon:  Stark Klein, MD;  Location: [facility];  Service: General;  Laterality: Left;  GEN AND PECTORAL BLOCK  . PORTACATH PLACEMENT Left 08/18/2019   Procedure: INSERTION PORT-A-CATH;  Surgeon: Stark Klein, MD;  Location: [place];  Service: General;  Laterality: Left;  . Rt knee arthoscopic    . TEE WITHOUT CARDIOVERSION N/A 02/21/2013   Procedure: TRANSESOPHAGEAL ECHOCARDIOGRAM (TEE);  Surgeon: Lelon Perla, MD;  Location: [facility] ENDOSCOPY;  Service: Cardiovascular;  Laterality: N/A;    There were no vitals filed for this visit.    Subjective Assessment - 04/22/20 0913    Subjective The Lt arm started swelling and the Lt arm is just not moving well. it was swelling even before surgery and now is worse.    Pertinent History Patient was diagnosed on 07/24/2019 with left grade III invasive ductal carcinoma breast cancer. ER/PR negative and HER2 positive with a Ki67 of 70%. neoadjuvant chemotherapy completed.  Lt Mastectomy 03/09/20 with 19 axillary nodes removed and 2 inframammary nodes removed.  All negative.  Radiation consultation on 05/04/20.    Patient Stated Goals swelling down, more mobility, get radiation started    Currently in Pain? Yes    Pain Score 2     Pain Location Axilla    Pain  Orientation Left    Pain Descriptors / Indicators Dull   with movement more sharp   Pain Type Surgical pain    Pain Onset More than a month ago    Pain Frequency Constant    Aggravating Factors  movement    Pain Relieving Factors medication    Effect of Pain on Daily Activities decreased arm movement              [facility] PT Assessment - 04/22/20 0001      Assessment   Medical Diagnosis Lt breast cancer    Referring Provider (PT) Dr. Lindi Adie    Onset Date/Surgical Date 03/09/20    Hand Dominance Right    Next MD Visit 05/04/20    Prior Therapy yes for shoulder pain      Precautions   Precaution Comments lymphedema      Restrictions   Weight Bearing Restrictions No      Balance Screen   Has the  patient fallen in the past 6 months No    Has the patient had a decrease in activity level because of a fear of falling?  No    Is the patient reluctant to leave their home because of a fear of falling?  No      Home Ecologist residence    Living Arrangements Children    Available Help at Discharge Family      Prior Function   Level of Ault Retired    Biomedical scientist retired Marine scientist    Leisure nothing right now      [employer]   Overall Cognitive Status Within Functional Limits for tasks assessed      Sensation   Additional Comments reports bilateral neuropathy to the ankle      Coordination   Gross Motor Movements are Fluid and Coordinated Yes      Posture/Postural Control   Posture/Postural Control Postural limitations    Postural Limitations Rounded Shoulders;Forward head      ROM / Strength   AROM / PROM / Strength AROM;PROM      AROM   AROM Assessment Site Shoulder    Right/Left Shoulder Right;Left    Left Shoulder Extension 52 Degrees    Left Shoulder Flexion 50 Degrees   axillary pain and UT pain. very guarded   Left Shoulder ABduction 60 Degrees   axillary pain     PROM   PROM Assessment Site Shoulder    Right/Left Shoulder Left    Left Shoulder Flexion 60 Degrees    Left Shoulder ABduction 60 Degrees      Palpation   Palpation comment tenderness anterior chest with possible hematoma present pt reports from last surgeon visit        Transfers   Comments reports fatigue level 5/10 overall "but it was a 10/10"              LYMPHEDEMA/ONCOLOGY QUESTIONNAIRE - 04/22/20 0001      Type   Cancer Type Left breast cancer      Surgeries   Mastectomy Date 03/09/20    Axillary Lymph Node Dissection Date 03/09/20    Number Lymph Nodes Removed --   19 axillary, 2 intramammary     Date Lymphedema/Swelling Started   Date --   before surgery     Treatment   Active Chemotherapy Treatment No      Past Chemotherapy Treatment Yes    Active Radiation Treatment No  Past Radiation Treatment Yes      What other symptoms do you have   Are you Having Heaviness or Tightness Yes    Are you having Pain Yes      Lymphedema Assessments   Lymphedema Assessments Upper extremities      Right Upper Extremity Lymphedema   15 cm Proximal to Olecranon Process 39.8 cm    10 cm Proximal to Olecranon Process 38 cm   35.5 baseline   Olecranon Process 33.5 cm   28.6 baseline   15 cm Proximal to Ulnar Styloid Process 31.7 cm    10 cm Proximal to Ulnar Styloid Process 27.7 cm   23.8 baseline   Just Proximal to Ulnar Styloid Process 20 cm   18.1 baseline   Across Hand at [employer] 22 cm   19.5 baseline   At [employer] of 2nd Digit 6.6 cm   6.1 baseline                  Outpatient Rehab from 04/22/2020 in [facility]  Lymphedema Life Impact Scale Total Score 38.24 %      Objective measurements completed on examination: See above findings.               PT Education - 04/22/20 1833    Education Details New post op POC, lymphedema and compression garments    Person(s) Educated Patient    Methods Explanation    Comprehension Verbalized understanding               PT Long Term Goals - 04/22/20 1849      PT LONG TERM GOAL #1   Title Pt will improve her Lt shoulder active flexion and abduction to at least 140 to improve overhead reaching    Baseline 50 / 60    Time 6    Period Weeks    Status New      PT LONG TERM GOAL #2   Title Pt will decrease circumferential measurements by at least 2cm globally proximal to the wrist to demonstrate improved Lt UE edema    Time 6    Period Weeks    Status New      PT LONG TERM GOAL #3   Title Pt will obtain appropriate compression garments for the Lt UE to manage lymphedema    Time 6    Period Weeks    Status New      PT LONG TERM GOAL #4   Title Pt will be ind with final HEP    Time  6    Period Weeks    Status New                  Plan - 04/22/20 1835    Clinical Impression Statement Pt returns around 6 weeks post operatively with significant Lt upper quadrant pain, stiffness, large horizontal and vertical incision with steristrips still present and Lt UE lymphedema/swelling.  Pt reports the swelling started even before surgery was improved with chemotherapy and then got worse again after surgery with removal of 19 lymph nodes. Pt will benefit from PT at this time to improve AROM, UE mobiilty, and lymphedema.  Pt has history of DVT and is currently on warfarin long term.    Personal Factors and Comorbidities Age;Finances;Comorbidity 1    Comorbidities ALND,    Examination-Activity Limitations Dressing;Sleep;Reach Overhead;Carry    Examination-Participation Restrictions Yard Work;Cleaning;Laundry;Community Activity    Stability/Clinical Decision Making Stable/Uncomplicated  Clinical Decision Making Low    Rehab Potential Excellent    PT Frequency 2x / week    PT Duration 6 weeks    PT Treatment/Interventions ADLs/Self Care Home Management;Therapeutic exercise;Patient/family education;Moist Heat;Therapeutic activities;Manual techniques;Joint Manipulations;Passive range of motion;Cryotherapy;Manual lymph drainage;Compression bandaging;Taping    PT Next Visit Plan how was tg soft for swelling? need to add/teach finger wraps/bandaging with compression garments if tolerated vs purchasing garment, PROM Lt shoulder/MLD Lt UE,    PT Home Exercise Plan supine flexion punch AAROM, standing dowel flexion, abduction, pendulums    Recommended Other Services will need garment/sleeve velcro vs regular for early post op?    Consulted and Agree with Plan of Care Patient           Patient will benefit from skilled therapeutic intervention in order to improve the following deficits and impairments:  Postural dysfunction, Decreased knowledge of precautions, Impaired UE  functional use, Pain, Decreased range of motion, Decreased strength, Increased edema  Visit Diagnosis: Malignant neoplasm of overlapping sites of left breast in female, estrogen receptor negative (HCC)  Abnormal posture  Stiffness of left shoulder, not elsewhere classified  Lymphedema     Problem List Patient Active Problem List   Diagnosis Date Noted  . Breast cancer metastasized to axillary lymph node, left ([place]) 03/09/2020  . Dehydration   . Non-intractable vomiting   . Intractable nausea and vomiting 09/21/2019  . Generalized weakness 09/21/2019  . AKI (acute kidney injury) ([place]) 09/21/2019  . Leukocytosis 09/21/2019  . Hypernatremia 09/21/2019  . Hyperchloremia 09/21/2019  . Thrombocytopenia ([place]) 09/21/2019  . Port-A-Cath in place 09/08/2019  . Family history of breast cancer   . Family history of throat cancer   . Family history of cervical cancer   . Malignant neoplasm of overlapping sites of left breast in female, estrogen receptor negative ([place]) 08/01/2019  . Family history of colon cancer mom 11/05/2018  . Encounter for therapeutic drug monitoring 11/21/2013  . Asthma with bronchitis 09/01/2013  . Chest pain 01/01/2013  . Aortic insufficiency 10/25/2011  . Chronic anticoagulation 09/29/2011  . Nocturnal dyspnea 09/29/2011  . VITAMIN D DEFICIENCY 08/06/2009  . DVT 01/26/2009  . KNEE PAIN, RIGHT 06/19/2008  . EDEMA 06/19/2008  . ANKLE PAIN, LEFT 02/13/2008  . HYPERKALEMIA 08/20/2007  . HYPOKALEMIA 08/20/2007  . HLD (hyperlipidemia) 07/18/2007  . Cardiomyopathy, hypertensive ([place]) 07/18/2007  . Essential hypertension 06/26/2007  . Hypertensive heart disease with heart failure ([place]) 06/26/2007  . Congestive heart failure ([place]) 06/26/2007  . Allergic rhinitis, cause unspecified 06/26/2007  . ASTHMA 06/26/2007  . SYMPTOM, SYNCOPE AND COLLAPSE 06/26/2007  . HEADACHE 06/26/2007  . HEART MURMUR, HX OF 06/26/2007  . DVT, HX OF 06/26/2007    Shan Levans,  PT 04/22/2020, 6:54 PM  [place], [place], [address] Phone: [phone]   Fax:  [phone]  Name: [name] Foster MRN: [id number] Date of Birth: 1951-01-27

## 2020-04-26 ENCOUNTER — Other Ambulatory Visit: Payer: Self-pay

## 2020-04-26 ENCOUNTER — Ambulatory Visit: Payer: Medicare Other

## 2020-04-26 DIAGNOSIS — I890 Lymphedema, not elsewhere classified: Secondary | ICD-10-CM

## 2020-04-26 DIAGNOSIS — Z171 Estrogen receptor negative status [ER-]: Secondary | ICD-10-CM

## 2020-04-26 DIAGNOSIS — M25612 Stiffness of left shoulder, not elsewhere classified: Secondary | ICD-10-CM | POA: Diagnosis not present

## 2020-04-26 DIAGNOSIS — R293 Abnormal posture: Secondary | ICD-10-CM

## 2020-04-26 DIAGNOSIS — C50812 Malignant neoplasm of overlapping sites of left female breast: Secondary | ICD-10-CM | POA: Diagnosis not present

## 2020-04-26 NOTE — Therapy (Signed)
[facility] [address] [place], [place], [address] Phone: [phone]   Fax:  [phone]  Physical Therapy Treatment  Patient Details  Name: [name] Foster MRN: [id number] Date of Birth: 06/07/1951 Referring Provider (PT): Dr. Gudena   Encounter Date: 04/26/2020   PT End of Session - 04/26/20 1632    Visit Number 2    Number of Visits 13    Date for PT Re-Evaluation 06/03/20    Authorization Type UHC medicare    PT Start Time 1506    PT Stop Time 1602    PT Time Calculation (min) 56 min    Activity Tolerance Patient tolerated treatment well    Behavior During Therapy WFL for tasks assessed/performed           Past Medical History:  Diagnosis Date  . Allergic rhinitis   . Anemia   . Aortic valve disorders   . Arthritis   . Asthma   . Cancer (HCC)    Breast Cancer  . CHF (congestive heart failure) (HCC)   . Coronary artery disease   . Family history of adverse reaction to anesthesia    difficulty waking mother up after surgery  . Family history of breast cancer   . Family history of cervical cancer   . Family history of colon cancer   . Family history of throat cancer   . Heart murmur   . Hyperlipidemia   . Hypertension   . Long term (current) use of anticoagulants   . Other primary cardiomyopathies   . Peripheral vascular disease (HCC)    right leg  . Personal history of colonic polyps   . Personal history of venous thrombosis and embolism     Past Surgical History:  Procedure Laterality Date  . ABDOMINAL HYSTERECTOMY    . BREAST BIOPSY Left 07/2019  . CARDIAC CATHETERIZATION    . COLON SURGERY     colonoscopy  . DILATION AND CURETTAGE OF UTERUS     several  . MASTECTOMY MODIFIED RADICAL Left 03/09/2020   Left Modified Radical Mastectomy (left mastectomy with axillary lymph node dissection)   . MASTECTOMY MODIFIED RADICAL Left 03/09/2020   Procedure: LEFT MODIFIED RADICAL MASTECTOMY;  Surgeon:  Byerly, Faera, MD;  Location: [facility];  Service: General;  Laterality: Left;  GEN AND PECTORAL BLOCK  . PORTACATH PLACEMENT Left 08/18/2019   Procedure: INSERTION PORT-A-CATH;  Surgeon: Byerly, Faera, MD;  Location: [facility];  Service: General;  Laterality: Left;  . Rt knee arthoscopic    . TEE WITHOUT CARDIOVERSION N/A 02/21/2013   Procedure: TRANSESOPHAGEAL ECHOCARDIOGRAM (TEE);  Surgeon: Brian S Crenshaw, MD;  Location: [facility] ENDOSCOPY;  Service: Cardiovascular;  Laterality: N/A;    There were no vitals filed for this visit.   Subjective Assessment - 04/26/20 1628    Subjective I've been wearing the sleeve (TG soft) she gave me last time and it did help my arm go down some.    Pertinent History Patient was diagnosed on 07/24/2019 with left grade III invasive ductal carcinoma breast cancer. ER/PR negative and HER2 positive with a Ki67 of 70%. neoadjuvant chemotherapy completed.  Lt Mastectomy 03/09/20 with 19 axillary nodes removed and 2 inframammary nodes removed.  All negative.  Radiation consultation on 05/04/20.    Patient Stated Goals swelling down, more mobility, get radiation started    Currently in Pain? No/denies   just tender to touch at Lt superior chest/ant shoulder                        Outpatient Rehab from 04/22/2020 in [facility]  Lymphedema Life Impact Scale Total Score 38.24 %            [facility] Adult PT Treatment/Exercise - 04/26/20 0001      Manual Therapy   Manual Therapy Manual Lymphatic Drainage (MLD);Compression Bandaging;Passive ROM    Manual Lymphatic Drainage (MLD) In Supine: Short neck, 5 diaphragmatic breaths, Lt inguinal and Rt axillary nodes, Lt axillo-inguinal and anterior inter-axillary (avoiding port at Lt superior chest) anastomosis, then Lt UE working from lateral upper arm to dorsum of hand then retracing all steps beginning to instruct pt throughout while performing.     Compression Bandaging To Lt UE: Coconut oil,  thin stockinette,  Elastomull to fingers 1-4, Artifoex x1, then 1-6, 1,10 and 1-12 cm short stretch compression bandages from hand to axilla.     Passive ROM To Lt shoulder gently into flexion and abduction briefly at end of session, VCs throughout to relax which pt was able to do, but struggles with muscle guarding.                       PT Long Term Goals - 04/22/20 1849      PT LONG TERM GOAL #1   Title Pt will improve her Lt shoulder active flexion and abduction to at least 140 to improve overhead reaching    Baseline 50 / 60    Time 6    Period Weeks    Status New      PT LONG TERM GOAL #2   Title Pt will decrease circumferential measurements by at least 2cm globally proximal to the wrist to demonstrate improved Lt UE edema    Time 6    Period Weeks    Status New      PT LONG TERM GOAL #3   Title Pt will obtain appropriate compression garments for the Lt UE to manage lymphedema    Time 6    Period Weeks    Status New      PT LONG TERM GOAL #4   Title Pt will be ind with final HEP    Time 6    Period Weeks    Status New                 Plan - 04/26/20 1633    Clinical Impression Statement Pt returns to physical therapy today for treatment of her Lt UE lymphedema. Began complete decongestive therapy today with education regarding infection prevention and skin care, also included manual lymph drainage and compression bandaging. Educated pt regarding anatomy of lymphatic system and basic principles of MLD while performing today answering her questions throughout. Pt was also instructed in remedial exercises for her Lt UE to help promote lymphatic flow, and also if she starts having any tingling or pain to first try these exs, if symptoms persist then can remove them. If they come off she is to bring them back to next session. Pt verbalized understanding all today and reports bandages feeling tolerable at end of session.    Personal Factors and Comorbidities  Age;Finances;Comorbidity 1    Comorbidities ALND,    Examination-Activity Limitations Dressing;Sleep;Reach Overhead;Carry    Examination-Participation Restrictions Yard Work;Cleaning;Laundry;Community Activity    Stability/Clinical Decision Making Stable/Uncomplicated    Rehab Potential Excellent    PT Frequency 2x / week    PT Duration 6 weeks    PT Treatment/Interventions ADLs/Self Care Home Management;Therapeutic exercise;Patient/family education;Moist Heat;Therapeutic activities;Manual techniques;Joint Manipulations;Passive   range of motion;Cryotherapy;Manual lymph drainage;Compression bandaging;Taping    PT Next Visit Plan How did pt tolerate compression bandages? Is she okay to cont this for now? Cont complete decongestive therapy to Lt UE if so, and cont P/ROM of Lt UE, may also benefit from pulleys. Troubleshoot other garment options if not agreeable to continuing compression bandaging.    PT Home Exercise Plan supine flexion punch AAROM, standing dowel flexion, abduction, pendulums, remedial exercises    Consulted and Agree with Plan of Care Patient           Patient will benefit from skilled therapeutic intervention in order to improve the following deficits and impairments:  Postural dysfunction, Decreased knowledge of precautions, Impaired UE functional use, Pain, Decreased range of motion, Decreased strength, Increased edema  Visit Diagnosis: Malignant neoplasm of overlapping sites of left breast in female, estrogen receptor negative (HCC)  Abnormal posture  Stiffness of left shoulder, not elsewhere classified  Lymphedema     Problem List Patient Active Problem List   Diagnosis Date Noted  . Breast cancer metastasized to axillary lymph node, left (HCC) 03/09/2020  . Dehydration   . Non-intractable vomiting   . Intractable nausea and vomiting 09/21/2019  . Generalized weakness 09/21/2019  . AKI (acute kidney injury) (HCC) 09/21/2019  . Leukocytosis 09/21/2019  .  Hypernatremia 09/21/2019  . Hyperchloremia 09/21/2019  . Thrombocytopenia (HCC) 09/21/2019  . Port-A-Cath in place 09/08/2019  . Family history of breast cancer   . Family history of throat cancer   . Family history of cervical cancer   . Malignant neoplasm of overlapping sites of left breast in female, estrogen receptor negative (HCC) 08/01/2019  . Family history of colon cancer mom 11/05/2018  . Encounter for therapeutic drug monitoring 11/21/2013  . Asthma with bronchitis 09/01/2013  . Chest pain 01/01/2013  . Aortic insufficiency 10/25/2011  . Chronic anticoagulation 09/29/2011  . Nocturnal dyspnea 09/29/2011  . VITAMIN D DEFICIENCY 08/06/2009  . DVT 01/26/2009  . KNEE PAIN, RIGHT 06/19/2008  . EDEMA 06/19/2008  . ANKLE PAIN, LEFT 02/13/2008  . HYPERKALEMIA 08/20/2007  . HYPOKALEMIA 08/20/2007  . HLD (hyperlipidemia) 07/18/2007  . Cardiomyopathy, hypertensive (HCC) 07/18/2007  . Essential hypertension 06/26/2007  . Hypertensive heart disease with heart failure (HCC) 06/26/2007  . Congestive heart failure (HCC) 06/26/2007  . Allergic rhinitis, cause unspecified 06/26/2007  . ASTHMA 06/26/2007  . SYMPTOM, SYNCOPE AND COLLAPSE 06/26/2007  . HEADACHE 06/26/2007  . HEART MURMUR, HX OF 06/26/2007  . DVT, HX OF 06/26/2007    ,  Ann, PTA 04/26/2020, 4:39 PM  [facility] [address] [place], [place], [address] Phone: [phone]   Fax:  [phone]  Name: Swathi Foster MRN: [id number] Date of Birth: 12/30/1950   

## 2020-04-28 ENCOUNTER — Other Ambulatory Visit: Payer: Self-pay

## 2020-04-28 ENCOUNTER — Ambulatory Visit: Payer: Medicare Other | Admitting: Physical Therapy

## 2020-04-28 ENCOUNTER — Encounter: Payer: Self-pay | Admitting: Physical Therapy

## 2020-04-28 DIAGNOSIS — R293 Abnormal posture: Secondary | ICD-10-CM

## 2020-04-28 DIAGNOSIS — I890 Lymphedema, not elsewhere classified: Secondary | ICD-10-CM

## 2020-04-28 DIAGNOSIS — Z171 Estrogen receptor negative status [ER-]: Secondary | ICD-10-CM | POA: Diagnosis not present

## 2020-04-28 DIAGNOSIS — M25612 Stiffness of left shoulder, not elsewhere classified: Secondary | ICD-10-CM

## 2020-04-28 DIAGNOSIS — C50812 Malignant neoplasm of overlapping sites of left female breast: Secondary | ICD-10-CM | POA: Diagnosis not present

## 2020-04-28 NOTE — Therapy (Signed)
[place], [place], [address] Phone: [phone]   Fax:  [phone]  Physical Therapy Treatment  Patient Details  Name: [name] Foster MRN: [id number] Date of Birth: [birth year] Referring Provider (PT): Dr. Lindi Adie   Encounter Date: 04/28/2020   PT End of Session - 04/28/20 1358    Visit Number 3    Number of Visits 13    Date for PT Re-Evaluation 06/03/20    Authorization Type [employer] medicare    PT Start Time 1301    PT Stop Time 1356    PT Time Calculation (min) 55 min    Activity Tolerance Patient tolerated treatment well    Behavior During Therapy [facility] for tasks assessed/performed           Past Medical History:  Diagnosis Date  . Allergic rhinitis   . Anemia   . Aortic valve disorders   . Arthritis   . Asthma   . Cancer [facility])    Breast Cancer  . CHF (congestive heart failure) ([place])   . Coronary artery disease   . Family history of adverse reaction to anesthesia    difficulty waking mother up after surgery  . Family history of breast cancer   . Family history of cervical cancer   . Family history of colon cancer   . Family history of throat cancer   . Heart murmur   . Hyperlipidemia   . Hypertension   . Long term (current) use of anticoagulants   . Other primary cardiomyopathies   . Peripheral vascular disease (HCC)    right leg  . Personal history of colonic polyps   . Personal history of venous thrombosis and embolism     Past Surgical History:  Procedure Laterality Date  . ABDOMINAL HYSTERECTOMY    . BREAST BIOPSY Left 07/2019  . CARDIAC CATHETERIZATION    . COLON SURGERY     colonoscopy  . DILATION AND CURETTAGE OF UTERUS     several  . MASTECTOMY MODIFIED RADICAL Left 03/09/2020   Left Modified Radical Mastectomy (left mastectomy with axillary lymph node dissection)   . MASTECTOMY MODIFIED RADICAL Left 03/09/2020   Procedure: LEFT MODIFIED RADICAL MASTECTOMY;  Surgeon:  Stark Klein, MD;  Location: [place];  Service: General;  Laterality: Left;  GEN AND PECTORAL BLOCK  . PORTACATH PLACEMENT Left 08/18/2019   Procedure: INSERTION PORT-A-CATH;  Surgeon: Stark Klein, MD;  Location: [place];  Service: General;  Laterality: Left;  . Rt knee arthoscopic    . TEE WITHOUT CARDIOVERSION N/A 02/21/2013   Procedure: TRANSESOPHAGEAL ECHOCARDIOGRAM (TEE);  Surgeon: Lelon Perla, MD;  Location: [facility] ENDOSCOPY;  Service: Cardiovascular;  Laterality: N/A;    There were no vitals filed for this visit.   Subjective Assessment - 04/28/20 1301    Subjective The bandages were fine I am just itchy from them.    Pertinent History Patient was diagnosed on 07/24/2019 with left grade III invasive ductal carcinoma breast cancer. ER/PR negative and HER2 positive with a Ki67 of 70%. neoadjuvant chemotherapy completed.  Lt Mastectomy 03/09/20 with 19 axillary nodes removed and 2 inframammary nodes removed.  All negative.  Radiation consultation on 05/04/20.    Patient Stated Goals swelling down, more mobility, get radiation started    Currently in Pain? No/denies    Pain Score 0-No pain                 LYMPHEDEMA/ONCOLOGY QUESTIONNAIRE - 04/28/20 0001  Right Upper Extremity Lymphedema   15 cm Proximal to Olecranon Process 38 cm (P)     10 cm Proximal to Olecranon Process 37 cm (P)     Olecranon Process 31.5 cm (P)     15 cm Proximal to Ulnar Styloid Process 29.8 cm (P)     10 cm Proximal to Ulnar Styloid Process 26.5 cm (P)     Just Proximal to Ulnar Styloid Process 21 cm (P)     Across Hand at [employer] 21.1 cm (P)     At Intercourse of 2nd Digit 6.4 cm (P)                 Outpatient Re[facility] from [facility]/22/[facility]  Lymphedema Life Impact Scale Total Score 38.24 %            [facility] Adult PT Treatment/Exercise - 04/28/20 0001      Manual Therapy   Manual Lymphatic Drainage (MLD) In Supine: Short neck, superficial and  deep abdominals, Lt inguinal and Rt axillary nodes, Lt axillo-inguinal and anterior inter-axillary (avoiding port at Lt superior chest) anastomosis, then Lt UE working from lateral upper arm to dorsum of hand then retracing all steps beginning to instruct pt throughout while performing.     Compression Bandaging To Lt UE: Coconut oil, thick stockinette,  Elastomull to fingers 1-4, Artifoex x1, then 1-6, 1,10 and 1-12 cm short stretch compression bandages from hand to axilla.     Passive ROM to left shoulder in direction of flexion, abduction and ER- pt very limited in all directions with increased stretching sensation                       PT Long Term Goals - 04/22/20 1849      PT LONG TERM GOAL #1   Title Pt will improve her Lt shoulder active flexion and abduction to at least 140 to improve overhead reaching    Baseline 50 / 60    Time 6    Period Weeks    Status New      PT LONG TERM GOAL #2   Title Pt will decrease circumferential measurements by at least 2cm globally proximal to the wrist to demonstrate improved Lt UE edema    Time 6    Period Weeks    Status New      PT LONG TERM GOAL #3   Title Pt will obtain appropriate compression garments for the Lt UE to manage lymphedema    Time 6    Period Weeks    Status New      PT LONG TERM GOAL #4   Title Pt will be ind with final HEP    Time 6    Period Weeks    Status New                 Plan - 04/28/20 1358    Clinical Impression Statement Pt developed a skin rash marked by tiny red bumps especially at forearm possibly from thin stockinette. Today used thick stockinette to see if that helped. Pt educated to remove bandages if she continues to itch. If rash is still present try stopping coconut oil prior to bandaging. Continued with PROM to L shoulder- pt is very tight and limited. Continued MLD to LUE and then bandaging. She demonstrated significant reduction today.    PT Frequency 2x / week    PT  Duration 6 weeks  PT Treatment/Interventions ADLs/Self Care Home Management;Therapeutic exercise;Patient/family education;Moist Heat;Therapeutic activities;Manual techniques;Joint Manipulations;Passive range of motion;Cryotherapy;Manual lymph drainage;Compression bandaging;Taping    PT Next Visit Plan did TG soft help rash?, Cont complete decongestive therapy to Lt UE if so, and cont P/ROM of Lt UE, may also benefit from pulleys. Troubleshoot other garment options if not agreeable to continuing compression bandaging.    PT Home Exercise Plan supine flexion punch AAROM, standing dowel flexion, abduction, pendulums, remedial exercises    Consulted and Agree with Plan of Care Patient           Patient will benefit from skilled therapeutic intervention in order to improve the following deficits and impairments:     Visit Diagnosis: Lymphedema  Stiffness of left shoulder, not elsewhere classified  Abnormal posture     Problem List Patient Active Problem List   Diagnosis Date Noted  . Breast cancer metastasized to axillary lymph node, left ([place]) 03/09/2020  . Dehydration   . Non-intractable vomiting   . Intractable nausea and vomiting 09/21/2019  . Generalized weakness 09/21/2019  . AKI (acute kidney injury) ([place]) 09/21/2019  . Leukocytosis 09/21/2019  . Hypernatremia 09/21/2019  . Hyperchloremia 09/21/2019  . Thrombocytopenia ([place]) 09/21/2019  . Port-A-Cath in place 09/08/2019  . Family history of breast cancer   . Family history of throat cancer   . Family history of cervical cancer   . Malignant neoplasm of overlapping sites of left breast in female, estrogen receptor negative ([place]) 08/01/2019  . Family history of colon cancer mom 11/05/2018  . Encounter for therapeutic drug monitoring 11/21/2013  . Asthma with bronchitis 09/01/2013  . Chest pain 01/01/2013  . Aortic insufficiency 10/25/2011  . Chronic anticoagulation 09/29/2011  . Nocturnal dyspnea 09/29/2011  . VITAMIN  D DEFICIENCY 08/06/2009  . DVT 01/26/2009  . KNEE PAIN, RIGHT 06/19/2008  . EDEMA 06/19/2008  . ANKLE PAIN, LEFT 02/13/2008  . HYPERKALEMIA 08/20/2007  . HYPOKALEMIA 08/20/2007  . HLD (hyperlipidemia) 07/18/2007  . Cardiomyopathy, hypertensive ([place]) 07/18/2007  . Essential hypertension 06/26/2007  . Hypertensive heart disease with heart failure ([place]) 06/26/2007  . Congestive heart failure ([place]) 06/26/2007  . Allergic rhinitis, cause unspecified 06/26/2007  . ASTHMA 06/26/2007  . SYMPTOM, SYNCOPE AND COLLAPSE 06/26/2007  . HEADACHE 06/26/2007  . HEART MURMUR, HX OF 06/26/2007  . DVT, HX OF 06/26/2007    Allyson Sabal [facility] 04/28/2020, 2:01 PM  [place], [place], [address] Phone: [phone]   Fax:  [phone]  Name: [name] Foster MRN: [id number] Date of Birth: 1951-05-06  Manus Gunning, PT 04/28/20 2:01 PM

## 2020-05-03 ENCOUNTER — Telehealth: Payer: Self-pay

## 2020-05-03 NOTE — Telephone Encounter (Signed)
Called patient in regards to telephone visit with Shona Simpson PA on 05/04/20 at 10:30am. Was unable to leave a voicemail message due to the mailbox being full. Will attempt another call to patient before visit.

## 2020-05-04 ENCOUNTER — Encounter: Payer: Self-pay | Admitting: Rehabilitation

## 2020-05-04 ENCOUNTER — Ambulatory Visit
Admission: RE | Admit: 2020-05-04 | Discharge: 2020-05-04 | Disposition: A | Discharge status: Home / Self Care | Payer: Medicare Other | Source: Ambulatory Visit | Attending: Radiation Oncology | Admitting: Radiation Oncology

## 2020-05-04 ENCOUNTER — Telehealth: Payer: Self-pay

## 2020-05-04 ENCOUNTER — Encounter: Payer: Self-pay | Admitting: Radiation Oncology

## 2020-05-04 ENCOUNTER — Ambulatory Visit: Payer: Medicare Other | Attending: Hematology and Oncology | Admitting: Rehabilitation

## 2020-05-04 ENCOUNTER — Other Ambulatory Visit: Payer: Self-pay

## 2020-05-04 DIAGNOSIS — Z803 Family history of malignant neoplasm of breast: Secondary | ICD-10-CM | POA: Diagnosis not present

## 2020-05-04 DIAGNOSIS — M25612 Stiffness of left shoulder, not elsewhere classified: Secondary | ICD-10-CM | POA: Insufficient documentation

## 2020-05-04 DIAGNOSIS — I890 Lymphedema, not elsewhere classified: Secondary | ICD-10-CM | POA: Insufficient documentation

## 2020-05-04 DIAGNOSIS — C50812 Malignant neoplasm of overlapping sites of left female breast: Secondary | ICD-10-CM

## 2020-05-04 DIAGNOSIS — R293 Abnormal posture: Secondary | ICD-10-CM | POA: Insufficient documentation

## 2020-05-04 DIAGNOSIS — Z171 Estrogen receptor negative status [ER-]: Secondary | ICD-10-CM | POA: Insufficient documentation

## 2020-05-04 DIAGNOSIS — Z9012 Acquired absence of left breast and nipple: Secondary | ICD-10-CM | POA: Diagnosis not present

## 2020-05-04 NOTE — Telephone Encounter (Signed)
Spoke with patient this morning is regards to telephone visit with [name] Simpson PA today at 10:30am. Meaningful use questions were reviewed with patient. TM

## 2020-05-04 NOTE — Progress Notes (Signed)
Radiation Oncology         [phone] ________________________________  Outpatient Follow Up - Conducted via telephone due to current COVID-19 concerns for limiting patient exposure  I spoke with the patient to conduct this consult visit via telephone to spare the patient unnecessary potential exposure in the healthcare setting during the current COVID-19 pandemic. The patient was notified in advance and was offered a [facility] meeting to allow for face to face communication but unfortunately reported that they did not have the appropriate resources/technology to support such a visit and instead preferred to proceed with a telephone visit.    Name: [name] Foster        MRN: [id number]  Date of Service: 05/04/2020 DOB: 06-13-1951  DG:LOVFIE, Standley Brooking, MD  Nicholas Lose, MD     REFERRING PHYSICIAN: Nicholas Lose, MD   DIAGNOSIS: The encounter diagnosis was Malignant neoplasm of overlapping sites of left breast in female, estrogen receptor negative ([place]).   HISTORY OF PRESENT ILLNESS: [name] Foster is a 69 y.o. female  Originally seen in the multidisciplinary breast clinic for a new diagnosis of left breast cancer. The patient was noted to have a palpable mass in the left breast. She proceeded to diagnostic imaging that revealed a mass at 3:30. Further diagnostic imaggin revealed a 2.8 x 2.8 x 3.6 cm mass and numerous other masses in the upper outer left breast, one at 3:00 measuring 9 mm, at 2:00 measurign 7 mm, at 2:00 also measuring 5 mm, at 1:00 measuring 3 mm, another at 1:00 measuring 4 mm, and more than 4 abnormal appearing lymph nodes. She underwent biopsy of the breast and axilla on 07/29/2019 revealing a grade 3 invasive ductal carcinoma in both breast specimens and carcinoma in the sampled node. Her tumor was ER/PR negative, HER2 amplified with a Ki 67 of 70%. She completed neoadjuvant chemotherapy between 08/19/2019-01/22/2020. A posttreatment MRI revealed complete resolution of the left  breast masses and skin thickening as well as adenopathy. She subsequently underwent left mastectomy and ALND on 03/09/20 that revealed benign breast tissue and treatment related changes. And 21 nodes were all clear of cancer. She is contacted today to proceed with adujvant radiotherapy and will continue Herceptin until completing 12 months of total treatment.  PREVIOUS RADIATION THERAPY: No   PAST MEDICAL HISTORY:  Past Medical History:  Diagnosis Date  . Allergic rhinitis   . Anemia   . Aortic valve disorders   . Arthritis   . Asthma   . Cancer [facility])    Breast Cancer  . CHF (congestive heart failure) ([place])   . Coronary artery disease   . Family history of adverse reaction to anesthesia    difficulty waking mother up after surgery  . Family history of breast cancer   . Family history of cervical cancer   . Family history of colon cancer   . Family history of throat cancer   . Heart murmur   . Hyperlipidemia   . Hypertension   . Long term (current) use of anticoagulants   . Other primary cardiomyopathies   . Peripheral vascular disease (HCC)    right leg  . Personal history of colonic polyps   . Personal history of venous thrombosis and embolism        PAST SURGICAL HISTORY: Past Surgical History:  Procedure Laterality Date  . ABDOMINAL HYSTERECTOMY    . BREAST BIOPSY Left 07/2019  . CARDIAC CATHETERIZATION    . COLON SURGERY     colonoscopy  .  DILATION AND CURETTAGE OF UTERUS     several  . MASTECTOMY MODIFIED RADICAL Left 03/09/2020   Left Modified Radical Mastectomy (left mastectomy with axillary lymph node dissection)   . MASTECTOMY MODIFIED RADICAL Left 03/09/2020   Procedure: LEFT MODIFIED RADICAL MASTECTOMY;  Surgeon: Stark Klein, MD;  Location: [place];  Service: General;  Laterality: Left;  GEN AND PECTORAL BLOCK  . PORTACATH PLACEMENT Left 08/18/2019   Procedure: INSERTION PORT-A-CATH;  Surgeon: Stark Klein, MD;  Location: [place];  Service: General;   Laterality: Left;  . Rt knee arthoscopic    . TEE WITHOUT CARDIOVERSION N/A 02/21/2013   Procedure: TRANSESOPHAGEAL ECHOCARDIOGRAM (TEE);  Surgeon: Lelon Perla, MD;  Location: [facility] ENDOSCOPY;  Service: Cardiovascular;  Laterality: N/A;     FAMILY HISTORY:  Family History  Problem Relation Age of Onset  . Alcohol abuse Other   . Depression Other   . Hyperlipidemia Other   . Hypertension Other   . Kidney disease Other   . Colon cancer Mother        dx late 59s  . Cervical cancer Maternal Grandmother   . Stroke Paternal Grandmother   . Breast cancer Maternal Aunt        dx 67s  . Breast cancer Cousin        dx 6s     SOCIAL HISTORY:  reports that she quit smoking about 10 months ago. She smoked 0.25 packs per day. She has never used smokeless tobacco. She reports current alcohol use. She reports that she does not use drugs. The patient is retired, lives in [place] and is separated. She is a retired Marine scientist and her sister joined [place] also on our call today.   ALLERGIES: Penicillins, Tetanus toxoid, and Aspirin   MEDICATIONS:  Current Outpatient Medications  Medication Sig Dispense Refill  . Albuterol Sulfate (PROAIR RESPICLICK) 503 (90 Base) MCG/ACT AEPB Inhale 2 puffs into the lungs every 6 (six) hours as needed. (Patient taking differently: Inhale 2 puffs into the lungs every 6 (six) hours as needed (SOB / wheezing). ) 2 each 1  . amLODipine (NORVASC) 10 MG tablet TAKE 1 TABLET BY MOUTH  DAILY (Patient taking differently: Take 10 mg by mouth daily. ) 90 tablet 3  . atorvastatin (LIPITOR) 20 MG tablet TAKE 1 TABLET BY MOUTH  DAILY 90 tablet 1  . Carboxymethylcellul-Glycerin (CLEAR EYES FOR DRY EYES) 1-0.25 % SOLN Place 1 drop into both eyes 2 (two) times daily as needed (dry eyes).    . Cholecalciferol (VITAMIN D) 50 MCG (2000 UT) tablet Take 2,000 Units by mouth daily.    Marland Kitchen enoxaparin (LOVENOX) 80 MG/0.8ML injection Inject 0.8 mLs (80 mg total) into the skin daily for 11 days.  To begin on 03/04/2020 8.8 mL 0  . fexofenadine (ALLEGRA) 180 MG tablet Take 180 mg by mouth daily as needed for allergies.     . fluticasone (FLONASE) 50 MCG/ACT nasal spray USE 2 SPRAYS IN EACH NOSTRIL DAILY. PT NEEDS TO SCHEDULE A FOLLOW UP APPT BEFORE NEXT REFILL. (Patient taking differently: Place 2 sprays into both nostrils daily. ) 16 g 0  . hydrALAZINE (APRESOLINE) 50 MG tablet Take 0.5 tablets (25 mg total) by mouth 3 (three) times daily. 1 tablet 0  . lidocaine-prilocaine (EMLA) cream Apply 1 application topically as directed. To affected area once    . methocarbamol (ROBAXIN) 500 MG tablet Take 1 tablet (500 mg total) by mouth every 6 (six) hours as needed for muscle spasms. 20 tablet  1  . metoprolol tartrate (LOPRESSOR) 50 MG tablet TAKE 1 TABLET BY MOUTH  TWICE DAILY 180 tablet 1  . ondansetron (ZOFRAN-ODT) 4 MG disintegrating tablet Take 1 tablet (4 mg total) by mouth every 6 (six) hours as needed for nausea. 20 tablet 0  . sodium chloride (OCEAN) 0.65 % SOLN nasal spray Place 1 spray into both nostrils as needed for congestion.    . traMADol (ULTRAM) 50 MG tablet Take 1 tablet (50 mg total) by mouth every 6 (six) hours as needed (mild pain). 20 tablet 0  . warfarin (COUMADIN) 5 MG tablet TAKE 1 TABLET BY MOUTH  DAILY OR AS DIRECTED BY  ANTICOAGULATION CLINIC 100 tablet 1   No current facility-administered medications for this encounter.     REVIEW OF SYSTEMS: On review of systems, the patient reports that she is doing well overall. She is working with PT for improvement in her ROM and lymphedema noted since surgery. Her course did take some time in order to remove her postoperative drains. Otherwise she feels quite well. No other complaints are verablized.    PHYSICAL EXAM:  Unable to assess due to encounter type.  ECOG = 0  0 - Asymptomatic (Fully active, able to carry on all predisease activities without restriction)  1 - Symptomatic but completely ambulatory (Restricted in  physically strenuous activity but ambulatory and able to carry out work of a light or sedentary nature. For example, light housework, office work)  2 - Symptomatic, <50% in bed during the day (Ambulatory and capable of all self care but unable to carry out any work activities. Up and about more than 50% of waking hours)  3 - Symptomatic, >50% in bed, but not bedbound (Capable of only limited self-care, confined to bed or chair 50% or more of waking hours)  4 - Bedbound (Completely disabled. Cannot carry on any self-care. Totally confined to bed or chair)  5 - Death   Eustace Pen MM, Creech RH, Tormey DC, et al. [phone]). "Toxicity and response criteria of the [facility] Group". [place] Oncol. 5 (6): 649-55    LABORATORY DATA:  Lab Results  Component Value Date   WBC 8.4 04/16/2020   HGB 10.1 (L) 04/16/2020   HCT 32.7 (L) 04/16/2020   MCV 89.1 04/16/2020   PLT 239 04/16/2020   Lab Results  Component Value Date   NA 144 04/16/2020   K 3.8 04/16/2020   CL 110 04/16/2020   CO2 23 04/16/2020   Lab Results  Component Value Date   ALT 26 04/16/2020   AST 24 04/16/2020   ALKPHOS 122 04/16/2020   BILITOT 0.3 04/16/2020      RADIOGRAPHY: No results found.     IMPRESSION/PLAN: 1. Stage IIIB, cT4N1M0 grade 3, HER2 amplifed invasive ductal carcinoma of the left breast. Dr. Lisbeth Renshaw discusses the pathology findings reviews how well she responded to chemotherapy radiographically and at the time of her pathologic assessement. While she is doing very well, Dr. Lisbeth Renshaw outlines that the recommendations for radiotherapy come from the clinical diagnosis when patients present with node positivity, and she would benefit from adjuvant radiotherapy to the chest wall and regional lymph nodes.  We discussed the risks, benefits, short, and long term effects of radiotherapy, and the patient is interested in proceeding at the appropriate interval. Dr. Lisbeth Renshaw discusses the delivery and logistics  of radiotherapy and recommends a course of 6 1/2 weeks of radiotherapy with deep inspiration breath hold technique. She will come next  Thursday for simulation at which time she will sign written consent to proceed. 2. Lymphedema and limited ROM. She will continue working with PT for management of this and we will follow this expectantly.   Given current concerns for patient exposure during the COVID-19 pandemic, this encounter was conducted via telephone.  The patient has provided two factor identification and has given verbal consent for this type of encounter and has been advised to only accept a meeting of this type in a secure network environment. The time spent during this encounter was 45 minutes including preparation, discussion, and coordination of the patient's care. The attendants for this meeting include Dr. Lisbeth Renshaw, Hayden Pedro  and Mardene Sayer and Elliot Gault. During the encounter,  [name] Nicely, RN, Dr. Lisbeth Renshaw, and Hayden Pedro were located at [facility] Radiation Oncology Department.  [name] Foster was located at home and her sister [name] Foster was also on the call remotely from her own home.    The above documentation reflects my direct findings during this shared patient visit. Please see the separate note by Dr. Lisbeth Renshaw on this date for the remainder of the patient's plan of care.    Carola Rhine, PAC

## 2020-05-04 NOTE — Patient Instructions (Signed)

## 2020-05-04 NOTE — Therapy (Signed)
[place], [place], [address] Phone: [phone]   Fax:  [phone]  Physical Therapy Treatment  Patient Details  Name: [name] Foster MRN: [id number] Date of Birth: October 09, 1950 Referring Provider (PT): Dr. Lindi Adie   Encounter Date: 05/04/2020   PT End of Session - 05/04/20 1650    Visit Number 4    Number of Visits 13    Date for PT Re-Evaluation 06/03/20    PT Start Time 1300    PT Stop Time 1355    PT Time Calculation (min) 55 min    Activity Tolerance Patient tolerated treatment well    Behavior During Therapy [facility] for tasks assessed/performed           Past Medical History:  Diagnosis Date  . Allergic rhinitis   . Anemia   . Aortic valve disorders   . Arthritis   . Asthma   . Cancer [facility])    Breast Cancer  . CHF (congestive heart failure) ([place])   . Coronary artery disease   . Family history of adverse reaction to anesthesia    difficulty waking mother up after surgery  . Family history of breast cancer   . Family history of cervical cancer   . Family history of colon cancer   . Family history of throat cancer   . Heart murmur   . Hyperlipidemia   . Hypertension   . Long term (current) use of anticoagulants   . Other primary cardiomyopathies   . Peripheral vascular disease (HCC)    right leg  . Personal history of colonic polyps   . Personal history of venous thrombosis and embolism     Past Surgical History:  Procedure Laterality Date  . ABDOMINAL HYSTERECTOMY    . BREAST BIOPSY Left 07/2019  . CARDIAC CATHETERIZATION    . COLON SURGERY     colonoscopy  . DILATION AND CURETTAGE OF UTERUS     several  . MASTECTOMY MODIFIED RADICAL Left 03/09/2020   Left Modified Radical Mastectomy (left mastectomy with axillary lymph node dissection)   . MASTECTOMY MODIFIED RADICAL Left 03/09/2020   Procedure: LEFT MODIFIED RADICAL MASTECTOMY;  Surgeon: [name] Klein, MD;  Location: [place];   Service: General;  Laterality: Left;  GEN AND PECTORAL BLOCK  . PORTACATH PLACEMENT Left 08/18/2019   Procedure: INSERTION PORT-A-CATH;  Surgeon: [name] Klein, MD;  Location: [place];  Service: General;  Laterality: Left;  . Rt knee arthoscopic    . TEE WITHOUT CARDIOVERSION N/A 02/21/2013   Procedure: TRANSESOPHAGEAL ECHOCARDIOGRAM (TEE);  Surgeon: Lelon Perla, MD;  Location: [facility] ENDOSCOPY;  Service: Cardiovascular;  Laterality: N/A;    There were no vitals filed for this visit.   Subjective Assessment - 05/04/20 1259    Subjective the hand is almost gone, but the forearm is still swollen.  I still feel the knot in there    Pertinent History Patient was diagnosed on 07/24/2019 with left grade III invasive ductal carcinoma breast cancer. ER/PR negative and HER2 positive with a Ki67 of 70%. neoadjuvant chemotherapy completed.  Lt Mastectomy 03/09/20 with 19 axillary nodes removed and 2 inframammary nodes removed.  All negative.  Radiation consultation on 05/04/20.    Patient Stated Goals swelling down, more mobility, get radiation started    Currently in Pain? Yes    Pain Score 5     Pain Location Axilla    Pain Orientation Left    Pain Descriptors / Indicators Aching;Dull  Pain Type Surgical pain    Pain Onset More than a month ago    Pain Frequency Constant                       Outpatient [facility]  Lymphedema Life Impact Scale Total Score 38.24 %            [facility] Adult PT Treatment/Exercise - 05/04/20 0001      Exercises   Exercises Shoulder      Shoulder Exercises: Supine   Flexion AAROM;5 reps    Flexion Limitations focus on arms to ceiling and then overhead as able; limited by pain    Other Supine Exercises supine butterfly / radiation prep position stretch 5" x 5 with this added to HEP      Shoulder Exercises: Pulleys   Flexion 2 minutes    Flexion Limitations cueing for initial performance       Manual Therapy   Manual Lymphatic Drainage (MLD) Focus on educating pt on self MLD steps with PT performing each and then patient repeating In Supine as able; educated pt that this will be easier for her in seated: Short neck, superficial and deep abdominals, Lt inguinal and Rt axillary nodes, Lt axillo-inguinal and anterior inter-axillary (avoiding port at Lt superior chest) anastomosis, then Lt UE working from lateral upper arm to dorsum of hand then retracing all steps beginning to instruct pt throughout while performing.     Compression Bandaging To Lt UE: Eucerin lotion, thick stockinette,  Elastomull to fingers 1-5, Artiflex x1, then 1-6, 1,10 and 1-12 cm short stretch compression bandages from hand to axilla. with 8cm bandage in herringbone to increase compression to the forearm    Passive ROM to left shoulder in direction of flexion, abduction and ER with vcs for relaxatin                  PT Education - 05/04/20 1410    Education Details self MLD    Person(s) Educated Patient    Methods Explanation;Demonstration;Tactile cues;Verbal cues;Handout    Comprehension Verbalized understanding;Returned demonstration;Verbal cues required;Tactile cues required;Need further instruction               PT Long Term Goals - 04/22/20 1849      PT LONG TERM GOAL #1   Title Pt will improve her Lt shoulder active flexion and abduction to at least 140 to improve overhead reaching    Baseline 50 / 60    Time 6    Period Weeks    Status New      PT LONG TERM GOAL #2   Title Pt will decrease circumferential measurements by at least 2cm globally proximal to the wrist to demonstrate improved Lt UE edema    Time 6    Period Weeks    Status New      PT LONG TERM GOAL #3   Title Pt will obtain appropriate compression garments for the Lt UE to manage lymphedema    Time 6    Period Weeks    Status New      PT LONG TERM GOAL #4   Title Pt will be ind with final HEP    Time 6     Period Weeks    Status New                 Plan - 05/04/20 1651    Clinical Impression Statement Pt with  no skin rash today improved with tg soft.  Pt is happy with reduction in hand but still has moderate congestion in the forearm so herringbone pattern was applied today to the forearm.  Pt has improved moblity and status of the Rt arm and chest compared to evaluation visit by this PT.  Pt is starting to tolerate more ROM activities with less pain and guarding.  Taught pt self MLD today and to do proximal MLD when wearing the bandage.    PT Frequency 2x / week    PT Duration 6 weeks    PT Treatment/Interventions ADLs/Self Care Home Management;Therapeutic exercise;Patient/family education;Moist Heat;Therapeutic activities;Manual techniques;Joint Manipulations;Passive range of motion;Cryotherapy;Manual lymph drainage;Compression bandaging;Taping    PT Next Visit Plan remeasure UE, Cont complete decongestive therapy to Lt UE/ teach pt self bandaging vs 3x per week?,  cont P/ROM of Lt UE, pulleys.    PT Home Exercise Plan supine flexion punch AAROM, standing dowel flexion, abduction, pendulums, remedial exercises, butterfly stretch    Consulted and Agree with Plan of Care Patient           Patient will benefit from skilled therapeutic intervention in order to improve the following deficits and impairments:     Visit Diagnosis: Lymphedema  Stiffness of left shoulder, not elsewhere classified  Abnormal posture  Malignant neoplasm of overlapping sites of left breast in female, estrogen receptor negative ([place])     Problem List Patient Active Problem List   Diagnosis Date Noted  . Breast cancer metastasized to axillary lymph node, left ([place]) 03/09/2020  . Dehydration   . Non-intractable vomiting   . Intractable nausea and vomiting 09/21/2019  . Generalized weakness 09/21/2019  . AKI (acute kidney injury) ([place]) 09/21/2019  . Leukocytosis 09/21/2019  . Hypernatremia 09/21/2019   . Hyperchloremia 09/21/2019  . Thrombocytopenia ([place]) 09/21/2019  . Port-A-Cath in place 09/08/2019  . Family history of breast cancer   . Family history of throat cancer   . Family history of cervical cancer   . Malignant neoplasm of overlapping sites of left breast in female, estrogen receptor negative ([place]) 08/01/2019  . Family history of colon cancer mom 11/05/2018  . Encounter for therapeutic drug monitoring 11/21/2013  . Asthma with bronchitis 09/01/2013  . Chest pain 01/01/2013  . Aortic insufficiency 10/25/2011  . Chronic anticoagulation 09/29/2011  . Nocturnal dyspnea 09/29/2011  . VITAMIN D DEFICIENCY 08/06/2009  . DVT 01/26/2009  . KNEE PAIN, RIGHT 06/19/2008  . EDEMA 06/19/2008  . ANKLE PAIN, LEFT 02/13/2008  . HYPERKALEMIA 08/20/2007  . HYPOKALEMIA 08/20/2007  . HLD (hyperlipidemia) 07/18/2007  . Cardiomyopathy, hypertensive ([place]) 07/18/2007  . Essential hypertension 06/26/2007  . Hypertensive heart disease with heart failure ([place]) 06/26/2007  . Congestive heart failure ([place]) 06/26/2007  . Allergic rhinitis, cause unspecified 06/26/2007  . ASTHMA 06/26/2007  . SYMPTOM, SYNCOPE AND COLLAPSE 06/26/2007  . HEADACHE 06/26/2007  . HEART MURMUR, HX OF 06/26/2007  . DVT, HX OF 06/26/2007    [name] Foster 05/04/2020, 4:54 PM  [place], [place], [address] Phone: [phone]   Fax:  [phone]  Name: [name] Foster MRN: [id number] Date of Birth: August 17, 1951

## 2020-05-05 DIAGNOSIS — C50912 Malignant neoplasm of unspecified site of left female breast: Secondary | ICD-10-CM | POA: Diagnosis not present

## 2020-05-06 ENCOUNTER — Other Ambulatory Visit: Payer: Medicare Other

## 2020-05-06 ENCOUNTER — Ambulatory Visit: Payer: Medicare Other | Admitting: Hematology and Oncology

## 2020-05-06 ENCOUNTER — Inpatient Hospital Stay: Payer: Medicare Other | Attending: Hematology and Oncology

## 2020-05-06 ENCOUNTER — Telehealth: Payer: Self-pay | Admitting: *Deleted

## 2020-05-06 ENCOUNTER — Other Ambulatory Visit: Payer: Self-pay

## 2020-05-06 ENCOUNTER — Ambulatory Visit: Payer: Medicare Other

## 2020-05-06 ENCOUNTER — Other Ambulatory Visit: Payer: Self-pay | Admitting: Hematology and Oncology

## 2020-05-06 ENCOUNTER — Encounter: Payer: Self-pay | Admitting: *Deleted

## 2020-05-06 VITALS — BP 140/61 | HR 55 | Temp 98.2°F | Resp 17 | Wt 190.5 lb

## 2020-05-06 DIAGNOSIS — I43 Cardiomyopathy in diseases classified elsewhere: Secondary | ICD-10-CM

## 2020-05-06 DIAGNOSIS — Z5112 Encounter for antineoplastic immunotherapy: Secondary | ICD-10-CM | POA: Diagnosis not present

## 2020-05-06 DIAGNOSIS — I890 Lymphedema, not elsewhere classified: Secondary | ICD-10-CM | POA: Diagnosis not present

## 2020-05-06 DIAGNOSIS — Z171 Estrogen receptor negative status [ER-]: Secondary | ICD-10-CM

## 2020-05-06 DIAGNOSIS — R293 Abnormal posture: Secondary | ICD-10-CM

## 2020-05-06 DIAGNOSIS — Z7901 Long term (current) use of anticoagulants: Secondary | ICD-10-CM | POA: Insufficient documentation

## 2020-05-06 DIAGNOSIS — C50812 Malignant neoplasm of overlapping sites of left female breast: Secondary | ICD-10-CM | POA: Insufficient documentation

## 2020-05-06 DIAGNOSIS — M25612 Stiffness of left shoulder, not elsewhere classified: Secondary | ICD-10-CM | POA: Diagnosis not present

## 2020-05-06 MED ORDER — SODIUM CHLORIDE 0.9 % IV SOLN
Freq: Once | INTRAVENOUS | Status: AC
  Filled 2020-05-06: qty 250

## 2020-05-06 MED ORDER — SODIUM CHLORIDE 0.9% FLUSH
10.0000 mL | INTRAVENOUS | Status: DC | PRN
  Administered 2020-05-06: 10 mL
  Filled 2020-05-06: qty 10

## 2020-05-06 MED ORDER — TRASTUZUMAB-ANNS CHEMO 150 MG IV SOLR
6.0000 mg/kg | Freq: Once | INTRAVENOUS | Status: AC
  Administered 2020-05-06: 504 mg via INTRAVENOUS
  Filled 2020-05-06: qty 24

## 2020-05-06 MED ORDER — ACETAMINOPHEN 325 MG PO TABS
ORAL_TABLET | ORAL | Status: AC
  Filled 2020-05-06: qty 2

## 2020-05-06 MED ORDER — ACETAMINOPHEN 325 MG PO TABS
650.0000 mg | ORAL_TABLET | Freq: Once | ORAL | Status: AC
  Administered 2020-05-06: 650 mg via ORAL

## 2020-05-06 MED ORDER — HEPARIN SOD (PORK) LOCK FLUSH 100 UNIT/ML IV SOLN
500.0000 [IU] | Freq: Once | INTRAVENOUS | Status: AC | PRN
  Administered 2020-05-06: 500 [IU]
  Filled 2020-05-06: qty 5

## 2020-05-06 MED ORDER — DIPHENHYDRAMINE HCL 25 MG PO CAPS
50.0000 mg | ORAL_CAPSULE | Freq: Once | ORAL | Status: AC
  Administered 2020-05-06: 50 mg via ORAL

## 2020-05-06 MED ORDER — DIPHENHYDRAMINE HCL 25 MG PO CAPS
ORAL_CAPSULE | ORAL | Status: AC
  Filled 2020-05-06: qty 2

## 2020-05-06 NOTE — Patient Instructions (Signed)
[facility] Discharge Instructions for Patients Receiving Chemotherapy  Today you received the following chemotherapy agents: Kanjinti  To help prevent nausea and vomiting after your treatment, we encourage you to take your nausea medication as directed.    If you develop nausea and vomiting that is not controlled by your nausea medication, call the clinic.   BELOW ARE SYMPTOMS THAT SHOULD BE REPORTED IMMEDIATELY:  *FEVER GREATER THAN 100.5 F  *CHILLS WITH OR WITHOUT FEVER  NAUSEA AND VOMITING THAT IS NOT CONTROLLED WITH YOUR NAUSEA MEDICATION  *UNUSUAL SHORTNESS OF BREATH  *UNUSUAL BRUISING OR BLEEDING  TENDERNESS IN MOUTH AND THROAT WITH OR WITHOUT PRESENCE OF ULCERS  *URINARY PROBLEMS  *BOWEL PROBLEMS  UNUSUAL RASH Items with * indicate a potential emergency and should be followed up as soon as possible.  Feel free to call the clinic should you have any questions or concerns. The clinic phone number is [phone].  Please show the CHEMO ALERT CARD at check-in to the Emergency Department and triage nurse.   

## 2020-05-06 NOTE — Progress Notes (Signed)
Per Dr. Lindi Adie, okay to treat with echo from April.

## 2020-05-06 NOTE — Therapy (Signed)
[place], [place], [address] Phone: [phone]   Fax:  [phone]  Physical Therapy Treatment  Patient Details  Name: [name] Foster MRN: [id number] Date of Birth: Jun 23, 1951 Referring Provider (PT): Dr. Lindi Adie   Encounter Date: 05/06/2020   PT End of Session - 05/06/20 1004    Visit Number 5    Number of Visits 13    Date for PT Re-Evaluation 06/03/20    PT Start Time 0906    PT Stop Time 1003    PT Time Calculation (min) 57 min    Activity Tolerance Patient tolerated treatment well    Behavior During Therapy [facility] for tasks assessed/performed           Past Medical History:  Diagnosis Date  . Allergic rhinitis   . Anemia   . Aortic valve disorders   . Arthritis   . Asthma   . Cancer [facility])    Breast Cancer  . CHF (congestive heart failure) ([place])   . Coronary artery disease   . Family history of adverse reaction to anesthesia    difficulty waking mother up after surgery  . Family history of breast cancer   . Family history of cervical cancer   . Family history of colon cancer   . Family history of throat cancer   . Heart murmur   . Hyperlipidemia   . Hypertension   . Long term (current) use of anticoagulants   . Other primary cardiomyopathies   . Peripheral vascular disease (HCC)    right leg  . Personal history of colonic polyps   . Personal history of venous thrombosis and embolism     Past Surgical History:  Procedure Laterality Date  . ABDOMINAL HYSTERECTOMY    . BREAST BIOPSY Left 07/2019  . CARDIAC CATHETERIZATION    . COLON SURGERY     colonoscopy  . DILATION AND CURETTAGE OF UTERUS     several  . MASTECTOMY MODIFIED RADICAL Left 03/09/2020   Left Modified Radical Mastectomy (left mastectomy with axillary lymph node dissection)   . MASTECTOMY MODIFIED RADICAL Left 03/09/2020   Procedure: LEFT MODIFIED RADICAL MASTECTOMY;  Surgeon: Stark Klein, MD;  Location: [place];   Service: General;  Laterality: Left;  GEN AND PECTORAL BLOCK  . PORTACATH PLACEMENT Left 08/18/2019   Procedure: INSERTION PORT-A-CATH;  Surgeon: Stark Klein, MD;  Location: [place];  Service: General;  Laterality: Left;  . Rt knee arthoscopic    . TEE WITHOUT CARDIOVERSION N/A 02/21/2013   Procedure: TRANSESOPHAGEAL ECHOCARDIOGRAM (TEE);  Surgeon: Lelon Perla, MD;  Location: [facility] ENDOSCOPY;  Service: Cardiovascular;  Laterality: N/A;    There were no vitals filed for this visit.   Subjective Assessment - 05/06/20 0910    Subjective The heat rash is much improved today. I just took the bandages off this morning to shower and put more of my anti-itch Aveeno lotion on it after my shower. Overall I can tell my arm is reducing and my axilla is feeling alot better too.    Pertinent History Patient was diagnosed on 07/24/2019 with left grade III invasive ductal carcinoma breast cancer. ER/PR negative and HER2 positive with a Ki67 of 70%. neoadjuvant chemotherapy completed.  Lt Mastectomy 03/09/20 with 19 axillary nodes removed and 2 inframammary nodes removed.  All negative.  Radiation consultation on 05/04/20.    Patient Stated Goals swelling down, more mobility, get radiation started    Currently in Pain? No/denies  LYMPHEDEMA/ONCOLOGY QUESTIONNAIRE - 05/06/20 0001      Left Upper Extremity Lymphedema   15 cm Proximal to Olecranon Process 37.1 cm    10 cm Proximal to Olecranon Process 35.7 cm    Olecranon Process 30.7 cm    15 cm Proximal to Ulnar Styloid Process 29.6 cm    10 cm Proximal to Ulnar Styloid Process 26.8 cm    Just Proximal to Ulnar Styloid Process 19.9 cm    Across Hand at [employer] 18.9 cm    At [place] of 2nd Digit 5.7 cm                Outpatient Re[facility] from [facility]22/[facility]  Lymphedema Life Impact Scale Total Score 38.24 %            [facility] Adult PT Treatment/Exercise - 05/06/20 0001       Shoulder Exercises: Pulleys   Flexion 3 minutes    Flexion Limitations Tactile and VCs throughout to decrease scapular compensation and Rt trunk lean    Scaption 2 minutes      Shoulder Exercises: Therapy Ball   Flexion Both;5 reps   forward lean into end of stretch   Flexion Limitations tactile cues by hterapist for each rep for scapular depression and upward rotation      Manual Therapy   Manual Lymphatic Drainage (MLD) In Supine as able: Short neck, superficial and deep abdominals, Lt inguinal and Rt axillary nodes, Lt axillo-inguinal and anterior inter-axillary (avoiding port at Lt superior chest) anastomosis, then Lt UE working from lateral upper arm to dorsum of hand then retracing all steps beginning to instruct pt throughout while performing.     Compression Bandaging To Lt UE: Eucerin lotion, thick stockinette,  Elastomull to fingers 1-4, Artiflex x1, then 1-6, 1,10 and 1-12 cm short stretch compression bandages from hand to axilla. with 8cm bandage in herringbone to increase compression to the forearm    Passive ROM --                       PT Long Term Goals - 04/22/20 1849      PT LONG TERM GOAL #1   Title Pt will improve her Lt shoulder active flexion and abduction to at least 140 to improve overhead reaching    Baseline 50 / 60    Time 6    Period Weeks    Status New      PT LONG TERM GOAL #2   Title Pt will decrease circumferential measurements by at least 2cm globally proximal to the wrist to demonstrate improved Lt UE edema    Time 6    Period Weeks    Status New      PT LONG TERM GOAL #3   Title Pt will obtain appropriate compression garments for the Lt UE to manage lymphedema    Time 6    Period Weeks    Status New      PT LONG TERM GOAL #4   Title Pt will be ind with final HEP    Time 6    Period Weeks    Status New                 Plan - 05/06/20 1004    Clinical Impression Statement Continued with complete decongestive therapy  and AA/ROM stretching of Lt shoulder. Focused alot on instruction of correct scapular rhythm with end ROM stretching and pt was able  to demonstrate and verbalize good understanding of this. Also encouraged her to try dowel exs in front of mirror at home for further reinforcement of corect technique. Heat rash/redness at forearm is much improved today as is itching, per pt report. Also remeasured circumference and this was much improved of Lt UE.    Personal Factors and Comorbidities Age;Finances;Comorbidity 1    Comorbidities ALND,    Examination-Activity Limitations Dressing;Sleep;Reach Overhead;Carry    Examination-Participation Restrictions Yard Work;Cleaning;Laundry;Community Activity    Stability/Clinical Decision Making Stable/Uncomplicated    Rehab Potential Excellent    PT Frequency 2x / week    PT Duration 6 weeks    PT Treatment/Interventions ADLs/Self Care Home Management;Therapeutic exercise;Patient/family education;Moist Heat;Therapeutic activities;Manual techniques;Joint Manipulations;Passive range of motion;Cryotherapy;Manual lymph drainage;Compression bandaging;Taping    PT Next Visit Plan Cont complete decongestive therapy to Lt UE/ teach pt self bandaging vs 3x per week?,  cont P/ROM of Lt UE, pulleys.    PT Home Exercise Plan supine flexion punch AAROM, standing dowel flexion, abduction, pendulums, remedial exercises, butterfly stretch    Consulted and Agree with Plan of Care Patient           Patient will benefit from skilled therapeutic intervention in order to improve the following deficits and impairments:  Postural dysfunction, Decreased knowledge of precautions, Impaired UE functional use, Pain, Decreased range of motion, Decreased strength, Increased edema  Visit Diagnosis: Lymphedema  Stiffness of left shoulder, not elsewhere classified  Abnormal posture  Malignant neoplasm of overlapping sites of left breast in female, estrogen receptor negative  ([place])     Problem List Patient Active Problem List   Diagnosis Date Noted  . Breast cancer metastasized to axillary lymph node, left ([place]) 03/09/2020  . Dehydration   . Non-intractable vomiting   . Intractable nausea and vomiting 09/21/2019  . Generalized weakness 09/21/2019  . AKI (acute kidney injury) ([place]) 09/21/2019  . Leukocytosis 09/21/2019  . Hypernatremia 09/21/2019  . Hyperchloremia 09/21/2019  . Thrombocytopenia ([place]) 09/21/2019  . Port-A-Cath in place 09/08/2019  . Family history of breast cancer   . Family history of throat cancer   . Family history of cervical cancer   . Malignant neoplasm of overlapping sites of left breast in female, estrogen receptor negative ([place]) 08/01/2019  . Family history of colon cancer mom 11/05/2018  . Encounter for therapeutic drug monitoring 11/21/2013  . Asthma with bronchitis 09/01/2013  . Chest pain 01/01/2013  . Aortic insufficiency 10/25/2011  . Chronic anticoagulation 09/29/2011  . Nocturnal dyspnea 09/29/2011  . VITAMIN D DEFICIENCY 08/06/2009  . DVT 01/26/2009  . KNEE PAIN, RIGHT 06/19/2008  . EDEMA 06/19/2008  . ANKLE PAIN, LEFT 02/13/2008  . HYPERKALEMIA 08/20/2007  . HYPOKALEMIA 08/20/2007  . HLD (hyperlipidemia) 07/18/2007  . Cardiomyopathy, hypertensive ([place]) 07/18/2007  . Essential hypertension 06/26/2007  . Hypertensive heart disease with heart failure ([place]) 06/26/2007  . Congestive heart failure ([place]) 06/26/2007  . Allergic rhinitis, cause unspecified 06/26/2007  . ASTHMA 06/26/2007  . SYMPTOM, SYNCOPE AND COLLAPSE 06/26/2007  . HEADACHE 06/26/2007  . HEART MURMUR, HX OF 06/26/2007  . DVT, HX OF 06/26/2007    Otelia Limes, PTA 05/06/2020, 10:07 AM  [place], [place], [address] Phone: [phone]   Fax:  [phone]  Name: [name] Foster MRN: [id number] Date of Birth: 03-05-1951

## 2020-05-06 NOTE — Telephone Encounter (Signed)
A detailed message was left, re: her follow up visit. 

## 2020-05-11 ENCOUNTER — Encounter: Payer: Self-pay | Admitting: Rehabilitation

## 2020-05-11 ENCOUNTER — Ambulatory Visit: Payer: Medicare Other | Admitting: Rehabilitation

## 2020-05-11 ENCOUNTER — Ambulatory Visit: Admission: RE | Admit: 2020-05-11 | Payer: Medicare Other | Source: Ambulatory Visit | Admitting: Radiation Oncology

## 2020-05-11 ENCOUNTER — Other Ambulatory Visit: Payer: Self-pay

## 2020-05-11 DIAGNOSIS — M25612 Stiffness of left shoulder, not elsewhere classified: Secondary | ICD-10-CM

## 2020-05-11 DIAGNOSIS — R293 Abnormal posture: Secondary | ICD-10-CM

## 2020-05-11 DIAGNOSIS — C50812 Malignant neoplasm of overlapping sites of left female breast: Secondary | ICD-10-CM | POA: Diagnosis not present

## 2020-05-11 DIAGNOSIS — Z171 Estrogen receptor negative status [ER-]: Secondary | ICD-10-CM | POA: Diagnosis not present

## 2020-05-11 DIAGNOSIS — I890 Lymphedema, not elsewhere classified: Secondary | ICD-10-CM

## 2020-05-11 NOTE — Therapy (Signed)
[place], [place], [address] Phone: [phone]   Fax:  [phone]  Physical Therapy Treatment  Patient Details  Name: [name] Foster MRN: [id number] Date of Birth: 27-Aug-1951 Referring Provider (PT): Dr. Lindi Adie   Encounter Date: 05/11/2020   PT End of Session - 05/11/20 1343    Visit Number 6    Number of Visits 13    Date for PT Re-Evaluation 06/03/20    Authorization Type [employer] medicare    PT Start Time 1301    PT Stop Time 4431   leaving for radiation simulation   PT Time Calculation (min) 39 min           Past Medical History:  Diagnosis Date  . Allergic rhinitis   . Anemia   . Aortic valve disorders   . Arthritis   . Asthma   . Cancer [facility])    Breast Cancer  . CHF (congestive heart failure) ([place])   . Coronary artery disease   . Family history of adverse reaction to anesthesia    difficulty waking mother up after surgery  . Family history of breast cancer   . Family history of cervical cancer   . Family history of colon cancer   . Family history of throat cancer   . Heart murmur   . Hyperlipidemia   . Hypertension   . Long term (current) use of anticoagulants   . Other primary cardiomyopathies   . Peripheral vascular disease (HCC)    right leg  . Personal history of colonic polyps   . Personal history of venous thrombosis and embolism     Past Surgical History:  Procedure Laterality Date  . ABDOMINAL HYSTERECTOMY    . BREAST BIOPSY Left 07/2019  . CARDIAC CATHETERIZATION    . COLON SURGERY     colonoscopy  . DILATION AND CURETTAGE OF UTERUS     several  . MASTECTOMY MODIFIED RADICAL Left 03/09/2020   Left Modified Radical Mastectomy (left mastectomy with axillary lymph node dissection)   . MASTECTOMY MODIFIED RADICAL Left 03/09/2020   Procedure: LEFT MODIFIED RADICAL MASTECTOMY;  Surgeon: Stark Klein, MD;  Location: [place];  Service: General;  Laterality: Left;  GEN AND  PECTORAL BLOCK  . PORTACATH PLACEMENT Left 08/18/2019   Procedure: INSERTION PORT-A-CATH;  Surgeon: Stark Klein, MD;  Location: [place];  Service: General;  Laterality: Left;  . Rt knee arthoscopic    . TEE WITHOUT CARDIOVERSION N/A 02/21/2013   Procedure: TRANSESOPHAGEAL ECHOCARDIOGRAM (TEE);  Surgeon: Lelon Perla, MD;  Location[facility] ENDOSCOPY;  Service: Cardiovascular;  Laterality: N/A;    There were no vitals filed for this visit.   Subjective Assessment - 05/11/20 1304    Subjective My rash is getting bad I think I need to stop bandaging now.    Pertinent History Patient was diagnosed on 07/24/2019 with left grade III invasive ductal carcinoma breast cancer. ER/PR negative and HER2 positive with a Ki67 of 70%. neoadjuvant chemotherapy completed.  Lt Mastectomy 03/09/20 with 19 axillary nodes removed and 2 inframammary nodes removed.  All negative.  Radiation consultation on 05/04/20.    Patient Stated Goals swelling down, more mobility, get radiation started    Currently in Pain? No/denies                       [facility]patient [facility]  Lymphedema Life Impact Scale Total Score 38.24 %  [facility] Adult PT Treatment/Exercise - 05/11/20 0001      Manual Therapy   Manual therapy comments measured arm for exostrong flat knit size L tall sleeve and size L glove. Pt is between M and L glove but went with wrist sizing as to not be too tight. Pt given information on how to order online    Compression Bandaging Pt continues with Lt forearm and elbow region and continues with itching so pt anxous to get done with bandaging.  Discussed with pt today and she would like to bandage again today and recheck tomorrow.  Turned tg soft inside out to use smooth surface vs rough, fingers not bandaged as a trial per pt request, artiflex x 1 from hand to axilla, then 1-6cm, 1 10cm, 1-12cm bandage from hand to axilla, herringbone  second bandage                        PT Long Term Goals - 04/22/20 1849      PT LONG TERM GOAL #1   Title Pt will improve her Lt shoulder active flexion and abduction to at least 140 to improve overhead reaching    Baseline 50 / 60    Time 6    Period Weeks    Status New      PT LONG TERM GOAL #2   Title Pt will decrease circumferential measurements by at least 2cm globally proximal to the wrist to demonstrate improved Lt UE edema    Time 6    Period Weeks    Status New      PT LONG TERM GOAL #3   Title Pt will obtain appropriate compression garments for the Lt UE to manage lymphedema    Time 6    Period Weeks    Status New      PT LONG TERM GOAL #4   Title Pt will be ind with final HEP    Time 6    Period Weeks    Status New                 Plan - 05/11/20 1343    Clinical Impression Statement pt continues with skin breakdown and itchiness with compression bandaging but is getting good reduction overall and would like to continue bandaging.  Pt was given ordering information for exostrong today and may order this soon.  Pt would benefit from another bandage on the UE but already has skin difficulty with current bandages    PT Frequency 2x / week    PT Duration 6 weeks    PT Treatment/Interventions ADLs/Self Care Home Management;Therapeutic exercise;Patient/family education;Moist Heat;Therapeutic activities;Manual techniques;Joint Manipulations;Passive range of motion;Cryotherapy;Manual lymph drainage;Compression bandaging;Taping    PT Next Visit Plan Cont complete decongestive therapy to Lt UE/ teach pt self bandaging vs 3x per week?,  cont P/ROM of Lt UE, pulleys.    Consulted and Agree with Plan of Care Patient           Patient will benefit from skilled therapeutic intervention in order to improve the following deficits and impairments:     Visit Diagnosis: Lymphedema  Stiffness of left shoulder, not elsewhere classified  Abnormal  posture  Malignant neoplasm of overlapping sites of left breast in female, estrogen receptor negative ([place])     Problem List Patient Active Problem List   Diagnosis Date Noted  . Breast cancer metastasized to axillary lymph node, left ([place]) 03/09/2020  . Dehydration   . Non-intractable vomiting   .  Intractable nausea and vomiting 09/21/2019  . Generalized weakness 09/21/2019  . AKI (acute kidney injury) ([place]) 09/21/2019  . Leukocytosis 09/21/2019  . Hypernatremia 09/21/2019  . Hyperchloremia 09/21/2019  . Thrombocytopenia ([place]) 09/21/2019  . Port-A-Cath in place 09/08/2019  . Family history of breast cancer   . Family history of throat cancer   . Family history of cervical cancer   . Malignant neoplasm of overlapping sites of left breast in female, estrogen receptor negative ([place]) 08/01/2019  . Family history of colon cancer mom 11/05/2018  . Encounter for therapeutic drug monitoring 11/21/2013  . Asthma with bronchitis 09/01/2013  . Chest pain 01/01/2013  . Aortic insufficiency 10/25/2011  . Chronic anticoagulation 09/29/2011  . Nocturnal dyspnea 09/29/2011  . VITAMIN D DEFICIENCY 08/06/2009  . DVT 01/26/2009  . KNEE PAIN, RIGHT 06/19/2008  . EDEMA 06/19/2008  . ANKLE PAIN, LEFT 02/13/2008  . HYPERKALEMIA 08/20/2007  . HYPOKALEMIA 08/20/2007  . HLD (hyperlipidemia) 07/18/2007  . Cardiomyopathy, hypertensive ([place]) 07/18/2007  . Essential hypertension 06/26/2007  . Hypertensive heart disease with heart failure ([place]) 06/26/2007  . Congestive heart failure ([place]) 06/26/2007  . Allergic rhinitis, cause unspecified 06/26/2007  . ASTHMA 06/26/2007  . SYMPTOM, SYNCOPE AND COLLAPSE 06/26/2007  . HEADACHE 06/26/2007  . HEART MURMUR, HX OF 06/26/2007  . DVT, HX OF 06/26/2007    Stark Bray 05/11/2020, 1:46 PM  [place], [place], [address] Phone: [phone]   Fax:  [phone]  Name:  Eunique Balik MRN: [id number] Date of Birth: 06-16-1951

## 2020-05-12 ENCOUNTER — Ambulatory Visit: Payer: Medicare Other | Admitting: Physical Therapy

## 2020-05-12 ENCOUNTER — Encounter: Payer: Self-pay | Admitting: Physical Therapy

## 2020-05-12 DIAGNOSIS — M25612 Stiffness of left shoulder, not elsewhere classified: Secondary | ICD-10-CM

## 2020-05-12 DIAGNOSIS — Z171 Estrogen receptor negative status [ER-]: Secondary | ICD-10-CM | POA: Diagnosis not present

## 2020-05-12 DIAGNOSIS — R293 Abnormal posture: Secondary | ICD-10-CM | POA: Diagnosis not present

## 2020-05-12 DIAGNOSIS — I890 Lymphedema, not elsewhere classified: Secondary | ICD-10-CM

## 2020-05-12 DIAGNOSIS — C50812 Malignant neoplasm of overlapping sites of left female breast: Secondary | ICD-10-CM | POA: Diagnosis not present

## 2020-05-12 NOTE — Therapy (Signed)
[place], [place], [address] Phone: [phone]   Fax:  [phone]  Physical Therapy Treatment  Patient Details  Name: [name] Foster MRN: [id number] Date of Birth: 1951-09-10 Referring Provider (PT): Dr. Lindi Adie   Encounter Date: 05/12/2020   PT End of Session - 05/12/20 1703    Visit Number 7    Number of Visits 13    Date for PT Re-Evaluation 06/03/20    Authorization Type UHC medicare    PT Start Time 1602    PT Stop Time 1651    PT Time Calculation (min) 49 min    Activity Tolerance Patient tolerated treatment well    Behavior During Therapy [facility] for tasks assessed/performed           Past Medical History:  Diagnosis Date  . Allergic rhinitis   . Anemia   . Aortic valve disorders   . Arthritis   . Asthma   . Cancer [facility])    Breast Cancer  . CHF (congestive heart failure) ([place])   . Coronary artery disease   . Family history of adverse reaction to anesthesia    difficulty waking mother up after surgery  . Family history of breast cancer   . Family history of cervical cancer   . Family history of colon cancer   . Family history of throat cancer   . Heart murmur   . Hyperlipidemia   . Hypertension   . Long term (current) use of anticoagulants   . Other primary cardiomyopathies   . Peripheral vascular disease (HCC)    right leg  . Personal history of colonic polyps   . Personal history of venous thrombosis and embolism     Past Surgical History:  Procedure Laterality Date  . ABDOMINAL HYSTERECTOMY    . BREAST BIOPSY Left 07/2019  . CARDIAC CATHETERIZATION    . COLON SURGERY     colonoscopy  . DILATION AND CURETTAGE OF UTERUS     several  . MASTECTOMY MODIFIED RADICAL Left 03/09/2020   Left Modified Radical Mastectomy (left mastectomy with axillary lymph node dissection)   . MASTECTOMY MODIFIED RADICAL Left 03/09/2020   Procedure: LEFT MODIFIED RADICAL MASTECTOMY;  Surgeon:  Stark Klein, MD;  Location: [place];  Service: General;  Laterality: Left;  GEN AND PECTORAL BLOCK  . PORTACATH PLACEMENT Left 08/18/2019   Procedure: INSERTION PORT-A-CATH;  Surgeon: Stark Klein, MD;  Location: [place];  Service: General;  Laterality: Left;  . Rt knee arthoscopic    . TEE WITHOUT CARDIOVERSION N/A 02/21/2013   Procedure: TRANSESOPHAGEAL ECHOCARDIOGRAM (TEE);  Surgeon: Lelon Perla, MD;  Location: [facility] ENDOSCOPY;  Service: Cardiovascular;  Laterality: N/A;    There were no vitals filed for this visit.   Subjective Assessment - 05/12/20 1603    Subjective I ordered the sleeve and glove yesterday. I took the bandages off at 2 so I could shower. My fingers were a little more swollen today.    Pertinent History Patient was diagnosed on 07/24/2019 with left grade III invasive ductal carcinoma breast cancer. ER/PR negative and HER2 positive with a Ki67 of 70%. neoadjuvant chemotherapy completed.  Lt Mastectomy 03/09/20 with 19 axillary nodes removed and 2 inframammary nodes removed.  All negative.  Radiation consultation on 05/04/20.    Patient Stated Goals swelling down, more mobility, get radiation started    Currently in Pain? No/denies    Pain Score 0-No pain  LYMPHEDEMA/ONCOLOGY QUESTIONNAIRE - 05/12/20 0001      Left Upper Extremity Lymphedema   15 cm Proximal to Olecranon Process 40 cm    10 cm Proximal to Olecranon Process 38 cm    Olecranon Process 31.9 cm    15 cm Proximal to Ulnar Styloid Process 29.5 cm    10 cm Proximal to Ulnar Styloid Process 26.5 cm    Just Proximal to Ulnar Styloid Process 20.9 cm    Across Hand at [employer] 20 cm    At [place] of 2nd Digit 6.4 cm                [facility]patient [facility]  Lymphedema Life Impact Scale Total Score 38.24 %            [facility] Adult PT Treatment/Exercise - 05/12/20 0001      Manual Therapy   Manual Lymphatic Drainage  (MLD) In Supine: Short neck, superficial and deep abdominals, Lt inguinal and Rt axillary nodes, Lt axillo-inguinal and anterior inter-axillary (avoiding port at Lt superior chest) anastomosis, then Lt UE working from lateral upper arm to dorsum of hand then retracing all steps beginning to instruct pt throughout while performing    Compression Bandaging To Lt UE: thick stockinette turned inside out so smooth side is against skin,  Elastomull to fingers 2-4, Artiflex x1, then 1-6, 1,10 and 1-12 cm short stretch compression bandages from hand to axilla    Passive ROM to left shoulder in direction of flexion, abduction and ER with vcs for relaxation- pt very tight and was unable to undergo radiation simulation                       PT Long Term Goals - 04/22/20 1849      PT LONG TERM GOAL #1   Title Pt will improve her Lt shoulder active flexion and abduction to at least 140 to improve overhead reaching    Baseline 50 / 60    Time 6    Period Weeks    Status New      PT LONG TERM GOAL #2   Title Pt will decrease circumferential measurements by at least 2cm globally proximal to the wrist to demonstrate improved Lt UE edema    Time 6    Period Weeks    Status New      PT LONG TERM GOAL #3   Title Pt will obtain appropriate compression garments for the Lt UE to manage lymphedema    Time 6    Period Weeks    Status New      PT LONG TERM GOAL #4   Title Pt will be ind with final HEP    Time 6    Period Weeks    Status New                 Plan - 05/12/20 1704    Clinical Impression Statement Pt reports that turning the TG soft inside out helped decrease rash. She reports that she went for radiation simulation but was unable to get her arm up high enough to complete it. It was rescheduled to next week. Began working on PROM this session to help improve this so pt can undergo simulation next week. Continued MLD to LUE and compression bandaging while pt awaits arrival of  compression sleeve.    PT Frequency 2x / week    PT Duration 6 weeks  PT Treatment/Interventions ADLs/Self Care Home Management;Therapeutic exercise;Patient/family education;Moist Heat;Therapeutic activities;Manual techniques;Joint Manipulations;Passive range of motion;Cryotherapy;Manual lymph drainage;Compression bandaging;Taping    PT Next Visit Plan PROM to L shoulder so pt can undergo radiation simulation which had to be rescheduled due to inability to get in position, Cont complete decongestive therapy to Lt UE/ teach pt self bandaging vs 3x per week?,  cont P/ROM of Lt UE, pulleys.    PT Home Exercise Plan supine flexion punch AAROM, standing dowel flexion, abduction, pendulums, remedial exercises, butterfly stretch    Consulted and Agree with Plan of Care Patient           Patient will benefit from skilled therapeutic intervention in order to improve the following deficits and impairments:  Postural dysfunction, Decreased knowledge of precautions, Impaired UE functional use, Pain, Decreased range of motion, Decreased strength, Increased edema  Visit Diagnosis: Lymphedema  Stiffness of left shoulder, not elsewhere classified  Abnormal posture     Problem List Patient Active Problem List   Diagnosis Date Noted  . Breast cancer metastasized to axillary lymph node, left ([place]) 03/09/2020  . Dehydration   . Non-intractable vomiting   . Intractable nausea and vomiting 09/21/2019  . Generalized weakness 09/21/2019  . AKI (acute kidney injury) ([place]) 09/21/2019  . Leukocytosis 09/21/2019  . Hypernatremia 09/21/2019  . Hyperchloremia 09/21/2019  . Thrombocytopenia ([place]) 09/21/2019  . Port-A-Cath in place 09/08/2019  . Family history of breast cancer   . Family history of throat cancer   . Family history of cervical cancer   . Malignant neoplasm of overlapping sites of left breast in female, estrogen receptor negative ([place]) 08/01/2019  . Family history of colon cancer mom  11/05/2018  . Encounter for therapeutic drug monitoring 11/21/2013  . Asthma with bronchitis 09/01/2013  . Chest pain 01/01/2013  . Aortic insufficiency 10/25/2011  . Chronic anticoagulation 09/29/2011  . Nocturnal dyspnea 09/29/2011  . VITAMIN D DEFICIENCY 08/06/2009  . DVT 01/26/2009  . KNEE PAIN, RIGHT 06/19/2008  . EDEMA 06/19/2008  . ANKLE PAIN, LEFT 02/13/2008  . HYPERKALEMIA 08/20/2007  . HYPOKALEMIA 08/20/2007  . HLD (hyperlipidemia) 07/18/2007  . Cardiomyopathy, hypertensive ([place]) 07/18/2007  . Essential hypertension 06/26/2007  . Hypertensive heart disease with heart failure ([place]) 06/26/2007  . Congestive heart failure ([place]) 06/26/2007  . Allergic rhinitis, cause unspecified 06/26/2007  . ASTHMA 06/26/2007  . SYMPTOM, SYNCOPE AND COLLAPSE 06/26/2007  . HEADACHE 06/26/2007  . HEART MURMUR, HX OF 06/26/2007  . DVT, HX OF 06/26/2007    [facility] 05/12/2020, 5:06 PM  [place], [place], [address] Phone: [phone]   Fax:  [phone]  Name: [name] Foster MRN: [id number] Date of Birth: 28-Mar-1951  Manus Gunning, PT 05/12/20 5:07 PM

## 2020-05-18 ENCOUNTER — Ambulatory Visit: Payer: Medicare Other | Admitting: Rehabilitation

## 2020-05-18 ENCOUNTER — Ambulatory Visit: Payer: Medicare Other | Admitting: Radiation Oncology

## 2020-05-18 ENCOUNTER — Other Ambulatory Visit: Payer: Self-pay

## 2020-05-18 ENCOUNTER — Encounter: Payer: Self-pay | Admitting: Rehabilitation

## 2020-05-18 DIAGNOSIS — M25612 Stiffness of left shoulder, not elsewhere classified: Secondary | ICD-10-CM

## 2020-05-18 DIAGNOSIS — R293 Abnormal posture: Secondary | ICD-10-CM

## 2020-05-18 DIAGNOSIS — Z171 Estrogen receptor negative status [ER-]: Secondary | ICD-10-CM | POA: Diagnosis not present

## 2020-05-18 DIAGNOSIS — C50812 Malignant neoplasm of overlapping sites of left female breast: Secondary | ICD-10-CM

## 2020-05-18 DIAGNOSIS — I890 Lymphedema, not elsewhere classified: Secondary | ICD-10-CM | POA: Diagnosis not present

## 2020-05-18 NOTE — Therapy (Signed)
[place], [place], [address] Phone: [phone]   Fax:  [phone]  Physical Therapy Treatment  Patient Details  Name: [name] Foster MRN: [id number] Date of Birth: 01-25-1951 Referring Provider (PT): Dr. Lindi Adie   Encounter Date: 05/18/2020   PT End of Session - 05/18/20 1058    Visit Number 8    Number of Visits 13    Date for PT Re-Evaluation 06/03/20    PT Start Time 1000    PT Stop Time 1056    PT Time Calculation (min) 56 min    Activity Tolerance Patient tolerated treatment well    Behavior During Therapy [facility] for tasks assessed/performed           Past Medical History:  Diagnosis Date  . Allergic rhinitis   . Anemia   . Aortic valve disorders   . Arthritis   . Asthma   . Cancer [facility])    Breast Cancer  . CHF (congestive heart failure) ([place])   . Coronary artery disease   . Family history of adverse reaction to anesthesia    difficulty waking mother up after surgery  . Family history of breast cancer   . Family history of cervical cancer   . Family history of colon cancer   . Family history of throat cancer   . Heart murmur   . Hyperlipidemia   . Hypertension   . Long term (current) use of anticoagulants   . Other primary cardiomyopathies   . Peripheral vascular disease (HCC)    right leg  . Personal history of colonic polyps   . Personal history of venous thrombosis and embolism     Past Surgical History:  Procedure Laterality Date  . ABDOMINAL HYSTERECTOMY    . BREAST BIOPSY Left 07/2019  . CARDIAC CATHETERIZATION    . COLON SURGERY     colonoscopy  . DILATION AND CURETTAGE OF UTERUS     several  . MASTECTOMY MODIFIED RADICAL Left 03/09/2020   Left Modified Radical Mastectomy (left mastectomy with axillary lymph node dissection)   . MASTECTOMY MODIFIED RADICAL Left 03/09/2020   Procedure: LEFT MODIFIED RADICAL MASTECTOMY;  Surgeon: Stark Klein, MD;  Location: [place];   Service: General;  Laterality: Left;  GEN AND PECTORAL BLOCK  . PORTACATH PLACEMENT Left 08/18/2019   Procedure: INSERTION PORT-A-CATH;  Surgeon: Stark Klein, MD;  Location: [place];  Service: General;  Laterality: Left;  . Rt knee arthoscopic    . TEE WITHOUT CARDIOVERSION N/A 02/21/2013   Procedure: TRANSESOPHAGEAL ECHOCARDIOGRAM (TEE);  Surgeon: Lelon Perla, MD;  Location: [facility] ENDOSCOPY;  Service: Cardiovascular;  Laterality: N/A;    There were no vitals filed for this visit.   Subjective Assessment - 05/18/20 1001    Subjective The sleeve is supposed to come today.  I try radiation simulation again tomorrow    Pertinent History Patient was diagnosed on 07/24/2019 with left grade III invasive ductal carcinoma breast cancer. ER/PR negative and HER2 positive with a Ki67 of 70%. neoadjuvant chemotherapy completed.  Lt Mastectomy 03/09/20 with 19 axillary nodes removed and 2 inframammary nodes removed.  All negative.  Radiation consultation on 05/04/20.    Patient Stated Goals swelling down, more mobility, get radiation started    Currently in Pain? No/denies              [facility] PT Assessment - 05/18/20 0001      AROM   Left Shoulder Extension 56 Degrees  Left Shoulder Flexion 90 Degrees   pull   Left Shoulder ABduction 70 Degrees   pull                  [facility]patient Re[facility]  Lymphedema Life Impact Scale Total Score 38.24 %            [facility] Adult PT Treatment/Exercise - 05/18/20 0001      Exercises   Exercises Other Exercises    Other Exercises  gave pt clinic pulleys to use at home for radiation prep      Shoulder Exercises: Supine   External Rotation 10 reps;AROM    Flexion AAROM;Both;10 reps    Flexion Limitations with dowel and supine punch x 5    Diagonals Limitations attempted but too much pectoralis activation    Other Supine Exercises supine butterfly / radiation prep position stretch 5x15"  with PT assist for hold above table      Shoulder Exercises: Standing   Other Standing Exercises dowel flexion, abduction x 10 each      Shoulder Exercises: Pulleys   Flexion 3 minutes    Flexion Limitations Tactile and VCs throughout to decrease scapular compensation and Rt trunk lean    Scaption 2 minutes    Scaption Limitations tcs and vcs to decrease lean      Manual Therapy   Manual Therapy Soft tissue mobilization    Soft tissue mobilization to the left pectoralis during flexion PROM     Compression Bandaging To Lt UE: thick stockinette turned inside out so smooth side is against skin,  [place]lastomull to fingers 2-4, Artiflex x1, then 1-6, 1,10 and 1-12 cm short stretch compression bandages from hand to axilla    Passive ROM to left shoulder in direction of flexion, abduction and ER with vcs for relaxation                  PT Education - 05/18/20 1058    Education Details how to [facility] Surveyor, quantity) Educated Patient    Methods Explanation;Demonstration    Comprehension Verbalized understanding               PT Long Term Goals - 05/18/20 1100      PT LONG TERM GOAL #1   Title Pt will improve her Lt shoulder active flexion and abduction to at least 140 to improve overhead reaching    Status On-going      PT LONG TERM GOAL #2   Title Pt will decrease circumferential measurements by at least 2cm globally proximal to the wrist to demonstrate improved Lt UE edema    Status On-going      PT LONG TERM GOAL #3   Title Pt will obtain appropriate compression garments for the Lt UE to manage lymphedema    Status On-going      PT LONG TERM GOAL #4   Title Pt will be ind with final HEP    Status On-going                 Plan - 05/18/20 1058    Clinical Impression Statement Focused today on AAROM, PROM to the left shoulder for radiation simulation tomorrow.  Added some STM to the pectoralis with flexion today.  Incisions have good mobility and no  adhesions noted.  Educated pt on donning sleeve and glove for 1-2 hours the first time and that wearing this to radiation may be better  vs wraps as she reports they were restricting last time.  pt has improved AROM overall from last measurements.    PT Frequency 2x / week    PT Duration 6 weeks    PT Treatment/Interventions ADLs/Self Care Home Management;Therapeutic exercise;Patient/family education;Moist Heat;Therapeutic activities;Manual techniques;Joint Manipulations;Passive range of motion;Cryotherapy;Manual lymph drainage;Compression bandaging;Taping    PT Next Visit Plan PROM to L shoulder ROM so pt can undergo radiation simulation which had to be rescheduled due to inability to get in position, Cont complete decongestive therapy to Lt UE/ teach pt self bandaging vs 3x per week?,  cont P/ROM of Lt UE, pulleys.    PT Home Exercise Plan supine flexion punch AAROM, standing dowel flexion, abduction, pendulums, remedial exercises, butterfly stretch    Consulted and Agree with Plan of Care Patient           Patient will benefit from skilled therapeutic intervention in order to improve the following deficits and impairments:     Visit Diagnosis: Stiffness of left shoulder, not elsewhere classified  Lymphedema  Abnormal posture  Malignant neoplasm of overlapping sites of left breast in female, estrogen receptor negative ([place])     Problem List Patient Active Problem List   Diagnosis Date Noted  . Breast cancer metastasized to axillary lymph node, left ([place]) 03/09/2020  . Dehydration   . Non-intractable vomiting   . Intractable nausea and vomiting 09/21/2019  . Generalized weakness 09/21/2019  . AKI (acute kidney injury) ([place]) 09/21/2019  . Leukocytosis 09/21/2019  . Hypernatremia 09/21/2019  . Hyperchloremia 09/21/2019  . Thrombocytopenia ([place]) 09/21/2019  . Port-A-Cath in place 09/08/2019  . Family history of breast cancer   . Family history of throat cancer   . Family  history of cervical cancer   . Malignant neoplasm of overlapping sites of left breast in female, estrogen receptor negative ([place]) 08/01/2019  . Family history of colon cancer mom 11/05/2018  . Encounter for therapeutic drug monitoring 11/21/2013  . Asthma with bronchitis 09/01/2013  . Chest pain 01/01/2013  . Aortic insufficiency 10/25/2011  . Chronic anticoagulation 09/29/2011  . Nocturnal dyspnea 09/29/2011  . VITAMIN D DEFICIENCY 08/06/2009  . DVT 01/26/2009  . KNEE PAIN, RIGHT 06/19/2008  . EDEMA 06/19/2008  . ANKLE PAIN, LEFT 02/13/2008  . HYPERKALEMIA 08/20/2007  . HYPOKALEMIA 08/20/2007  . HLD (hyperlipidemia) 07/18/2007  . Cardiomyopathy, hypertensive ([place]) 07/18/2007  . Essential hypertension 06/26/2007  . Hypertensive heart disease with heart failure ([place]) 06/26/2007  . Congestive heart failure ([place]) 06/26/2007  . Allergic rhinitis, cause unspecified 06/26/2007  . ASTHMA 06/26/2007  . SYMPTOM, SYNCOPE AND COLLAPSE 06/26/2007  . HEADACHE 06/26/2007  . HEART MURMUR, HX OF 06/26/2007  . DVT, HX OF 06/26/2007    Stark Bray 05/18/2020, 11:02 AM  [place], [place], [address] Phone: [phone]   Fax:  [phone]  Name: [name] Foster MRN: [id number] Date of Birth: June 09, 1951

## 2020-05-19 ENCOUNTER — Ambulatory Visit (HOSPITAL_COMMUNITY)
Admission: RE | Admit: 2020-05-19 | Discharge: 2020-05-19 | Disposition: A | Discharge status: Home / Self Care | Payer: Medicare Other | Source: Ambulatory Visit | Attending: Hematology and Oncology | Admitting: Hematology and Oncology

## 2020-05-19 ENCOUNTER — Ambulatory Visit
Admission: RE | Admit: 2020-05-19 | Discharge: 2020-05-19 | Disposition: A | Discharge status: Home / Self Care | Payer: Medicare Other | Source: Ambulatory Visit | Attending: Radiation Oncology | Admitting: Radiation Oncology

## 2020-05-19 ENCOUNTER — Ambulatory Visit: Payer: Medicare Other

## 2020-05-19 ENCOUNTER — Other Ambulatory Visit: Payer: Self-pay

## 2020-05-19 DIAGNOSIS — I119 Hypertensive heart disease without heart failure: Secondary | ICD-10-CM | POA: Insufficient documentation

## 2020-05-19 DIAGNOSIS — Z5181 Encounter for therapeutic drug level monitoring: Secondary | ICD-10-CM | POA: Insufficient documentation

## 2020-05-19 DIAGNOSIS — Z87891 Personal history of nicotine dependence: Secondary | ICD-10-CM | POA: Diagnosis not present

## 2020-05-19 DIAGNOSIS — C50812 Malignant neoplasm of overlapping sites of left female breast: Secondary | ICD-10-CM | POA: Diagnosis not present

## 2020-05-19 DIAGNOSIS — I429 Cardiomyopathy, unspecified: Secondary | ICD-10-CM | POA: Diagnosis not present

## 2020-05-19 DIAGNOSIS — Z510 Encounter for antineoplastic radiation therapy: Secondary | ICD-10-CM | POA: Insufficient documentation

## 2020-05-19 DIAGNOSIS — I43 Cardiomyopathy in diseases classified elsewhere: Secondary | ICD-10-CM

## 2020-05-19 DIAGNOSIS — I351 Nonrheumatic aortic (valve) insufficiency: Secondary | ICD-10-CM | POA: Insufficient documentation

## 2020-05-19 DIAGNOSIS — I739 Peripheral vascular disease, unspecified: Secondary | ICD-10-CM | POA: Diagnosis not present

## 2020-05-19 DIAGNOSIS — C50912 Malignant neoplasm of unspecified site of left female breast: Secondary | ICD-10-CM | POA: Insufficient documentation

## 2020-05-19 DIAGNOSIS — Z9012 Acquired absence of left breast and nipple: Secondary | ICD-10-CM | POA: Insufficient documentation

## 2020-05-19 DIAGNOSIS — I2510 Atherosclerotic heart disease of native coronary artery without angina pectoris: Secondary | ICD-10-CM | POA: Diagnosis not present

## 2020-05-19 DIAGNOSIS — Z171 Estrogen receptor negative status [ER-]: Secondary | ICD-10-CM | POA: Insufficient documentation

## 2020-05-19 LAB — ECHOCARDIOGRAM COMPLETE
Area-P 1/2: 4.8 cm2
Calc EF: 47.8 %
MV M vel: 4.71 m/s
MV Peak grad: 88.7 mmHg
P 1/2 time: 717 msec
S' Lateral: 4 cm
Single Plane A2C EF: 47.3 %
Single Plane A4C EF: 47.5 %

## 2020-05-19 NOTE — Progress Notes (Signed)
  Echocardiogram 2D Echocardiogram has been performed.  Michiel Cowboy 05/19/2020, 10:27 AM

## 2020-05-20 ENCOUNTER — Ambulatory Visit: Payer: Medicare Other | Admitting: Rehabilitation

## 2020-05-20 ENCOUNTER — Ambulatory Visit: Payer: Medicare Other

## 2020-05-20 ENCOUNTER — Encounter: Payer: Self-pay | Admitting: Rehabilitation

## 2020-05-20 DIAGNOSIS — M25612 Stiffness of left shoulder, not elsewhere classified: Secondary | ICD-10-CM

## 2020-05-20 DIAGNOSIS — R293 Abnormal posture: Secondary | ICD-10-CM | POA: Diagnosis not present

## 2020-05-20 DIAGNOSIS — Z171 Estrogen receptor negative status [ER-]: Secondary | ICD-10-CM | POA: Diagnosis not present

## 2020-05-20 DIAGNOSIS — I890 Lymphedema, not elsewhere classified: Secondary | ICD-10-CM

## 2020-05-20 DIAGNOSIS — C50812 Malignant neoplasm of overlapping sites of left female breast: Secondary | ICD-10-CM

## 2020-05-20 NOTE — Therapy (Signed)
[place], [place], [address] Phone: [phone]   Fax:  [phone]  Physical Therapy Treatment  Patient Details  Name: [name] Foster MRN: [id number] Date of Birth: 12/03/1950 Referring Provider (PT): Dr. Lindi Adie   Encounter Date: 05/20/2020   PT End of Session - 05/20/20 1059    Visit Number 9    Number of Visits 13    Date for PT Re-Evaluation 06/03/20    PT Start Time 1004    PT Stop Time 1058    PT Time Calculation (min) 54 min    Activity Tolerance Patient tolerated treatment well    Behavior During Therapy [facility] for tasks assessed/performed           Past Medical History:  Diagnosis Date  . Allergic rhinitis   . Anemia   . Aortic valve disorders   . Arthritis   . Asthma   . Cancer [facility])    Breast Cancer  . CHF (congestive heart failure) ([place])   . Coronary artery disease   . Family history of adverse reaction to anesthesia    difficulty waking mother up after surgery  . Family history of breast cancer   . Family history of cervical cancer   . Family history of colon cancer   . Family history of throat cancer   . Heart murmur   . Hyperlipidemia   . Hypertension   . Long term (current) use of anticoagulants   . Other primary cardiomyopathies   . Peripheral vascular disease (HCC)    right leg  . Personal history of colonic polyps   . Personal history of venous thrombosis and embolism     Past Surgical History:  Procedure Laterality Date  . ABDOMINAL HYSTERECTOMY    . BREAST BIOPSY Left 07/2019  . CARDIAC CATHETERIZATION    . COLON SURGERY     colonoscopy  . DILATION AND CURETTAGE OF UTERUS     several  . MASTECTOMY MODIFIED RADICAL Left 03/09/2020   Left Modified Radical Mastectomy (left mastectomy with axillary lymph node dissection)   . MASTECTOMY MODIFIED RADICAL Left 03/09/2020   Procedure: LEFT MODIFIED RADICAL MASTECTOMY;  Surgeon: Stark Klein, MD;  Location: [employer];   Service: General;  Laterality: Left;  GEN AND PECTORAL BLOCK  . PORTACATH PLACEMENT Left 08/18/2019   Procedure: INSERTION PORT-A-CATH;  Surgeon: Stark Klein, MD;  Location: [place];  Service: General;  Laterality: Left;  . Rt knee arthoscopic    . TEE WITHOUT CARDIOVERSION N/A 02/21/2013   Procedure: TRANSESOPHAGEAL ECHOCARDIOGRAM (TEE);  Surgeon: Lelon Perla, MD;  Location: [facility] ENDOSCOPY;  Service: Cardiovascular;  Laterality: N/A;    There were no vitals filed for this visit.   Subjective Assessment - 05/20/20 1005    Subjective They were able to get the radiation simulation done.  I also got my sleeve.  I wore it for about 3 hours and it seems to be good.    Pertinent History Patient was diagnosed on 07/24/2019 with left grade III invasive ductal carcinoma breast cancer. ER/PR negative and HER2 positive with a Ki67 of 70%. neoadjuvant chemotherapy completed.  Lt Mastectomy 03/09/20 with 19 axillary nodes removed and 2 inframammary nodes removed.  All negative.  Radiation consultation on 05/04/20.    Patient Stated Goals swelling down, more mobility, get radiation started    Currently in Pain? No/denies  [facility]patient [facility]  Lymphedema Life Impact Scale Total Score 38.24 %            [facility] Adult PT Treatment/Exercise - 05/20/20 0001      Shoulder Exercises: Supine   External Rotation 5 reps;Left    Other Supine Exercises bicep curl, tricep  skull crusher and punches each x 5      Manual Therapy   Manual therapy comments pt arrives with new garments which the sleeve is a bit too long by 2 inches so pt will return this sleeve for the regular length but overall the compression and fit seemed good.  Glove fits welll    Soft tissue mobilization to the left pectoralis during flexion PROM     Manual Lymphatic Drainage (MLD) In Supine: Short neck, superficial and deep abdominals, Lt inguinal  and Rt axillary nodes, Lt axillo-inguinal and anterior inter-axillary (avoiding port at Lt superior chest) anastomosis, then Lt UE working from lateral upper arm to dorsum of hand then retracing all steps beginning to instruct pt throughout while performing    Passive ROM to left shoulder in direction of flexion, abduction and ER with vcs for relaxation                       PT Long Term Goals - 05/18/20 1100      PT LONG TERM GOAL #1   Title Pt will improve her Lt shoulder active flexion and abduction to at least 140 to improve overhead reaching    Status On-going      PT LONG TERM GOAL #2   Title Pt will decrease circumferential measurements by at least 2cm globally proximal to the wrist to demonstrate improved Lt UE edema    Status On-going      PT LONG TERM GOAL #3   Title Pt will obtain appropriate compression garments for the Lt UE to manage lymphedema    Status On-going      PT LONG TERM GOAL #4   Title Pt will be ind with final HEP    Status On-going                 Plan - 05/20/20 1059    Clinical Impression Statement Pt was able to complete radiation simulation yesterday and will start treatments on 05/26/20.  Pt reported increased soreness from this but tolerable.  Continued to focus on PROM, pectoralis STM, and MLD.    PT Frequency 2x / week    PT Duration 6 weeks    PT Treatment/Interventions ADLs/Self Care Home Management;Therapeutic exercise;Patient/family education;Moist Heat;Therapeutic activities;Manual techniques;Joint Manipulations;Passive range of motion;Cryotherapy;Manual lymph drainage;Compression bandaging;Taping    PT Next Visit Plan AA/A/PROM to L shoulder ROM, remeasure circumferences around 8.25 to see if garment is containing    Consulted and Agree with Plan of Care Patient           Patient will benefit from skilled therapeutic intervention in order to improve the following deficits and impairments:     Visit  Diagnosis: Stiffness of left shoulder, not elsewhere classified  Lymphedema  Abnormal posture  Malignant neoplasm of overlapping sites of left breast in female, estrogen receptor negative ([place])     Problem List Patient Active Problem List   Diagnosis Date Noted  . Breast cancer metastasized to axillary lymph node, left ([place]) 03/09/2020  . Dehydration   . Non-intractable vomiting   . Intractable nausea and vomiting 09/21/2019  . Generalized  weakness 09/21/2019  . AKI (acute kidney injury) ([place]) 09/21/2019  . Leukocytosis 09/21/2019  . Hypernatremia 09/21/2019  . Hyperchloremia 09/21/2019  . Thrombocytopenia ([place]) 09/21/2019  . Port-A-Cath in place 09/08/2019  . Family history of breast cancer   . Family history of throat cancer   . Family history of cervical cancer   . Malignant neoplasm of overlapping sites of left breast in female, estrogen receptor negative ([place]) 08/01/2019  . Family history of colon cancer mom 11/05/2018  . Encounter for therapeutic drug monitoring 11/21/2013  . Asthma with bronchitis 09/01/2013  . Chest pain 01/01/2013  . Aortic insufficiency 10/25/2011  . Chronic anticoagulation 09/29/2011  . Nocturnal dyspnea 09/29/2011  . VITAMIN D DEFICIENCY 08/06/2009  . DVT 01/26/2009  . KNEE PAIN, RIGHT 06/19/2008  . EDEMA 06/19/2008  . ANKLE PAIN, LEFT 02/13/2008  . HYPERKALEMIA 08/20/2007  . HYPOKALEMIA 08/20/2007  . HLD (hyperlipidemia) 07/18/2007  . Cardiomyopathy, hypertensive ([place]) 07/18/2007  . Essential hypertension 06/26/2007  . Hypertensive heart disease with heart failure ([place]) 06/26/2007  . Congestive heart failure ([place]) 06/26/2007  . Allergic rhinitis, cause unspecified 06/26/2007  . ASTHMA 06/26/2007  . SYMPTOM, SYNCOPE AND COLLAPSE 06/26/2007  . HEADACHE 06/26/2007  . HEART MURMUR, HX OF 06/26/2007  . DVT, HX OF 06/26/2007    Stark Bray 05/20/2020, 11:02 AM  [place], [place], [address] Phone: [phone]   Fax:  [phone]  Name: [name] Foster MRN: [id number] Date of Birth: [birth year]

## 2020-05-21 ENCOUNTER — Ambulatory Visit: Payer: Medicare Other

## 2020-05-24 ENCOUNTER — Ambulatory Visit: Payer: Medicare Other

## 2020-05-25 ENCOUNTER — Other Ambulatory Visit: Payer: Self-pay

## 2020-05-25 ENCOUNTER — Ambulatory Visit: Payer: Medicare Other

## 2020-05-25 ENCOUNTER — Ambulatory Visit: Payer: Medicare Other | Admitting: Rehabilitation

## 2020-05-25 ENCOUNTER — Encounter: Payer: Self-pay | Admitting: Rehabilitation

## 2020-05-25 DIAGNOSIS — M25612 Stiffness of left shoulder, not elsewhere classified: Secondary | ICD-10-CM | POA: Diagnosis not present

## 2020-05-25 DIAGNOSIS — Z171 Estrogen receptor negative status [ER-]: Secondary | ICD-10-CM

## 2020-05-25 DIAGNOSIS — C50812 Malignant neoplasm of overlapping sites of left female breast: Secondary | ICD-10-CM | POA: Diagnosis not present

## 2020-05-25 DIAGNOSIS — C50912 Malignant neoplasm of unspecified site of left female breast: Secondary | ICD-10-CM

## 2020-05-25 DIAGNOSIS — R293 Abnormal posture: Secondary | ICD-10-CM | POA: Diagnosis not present

## 2020-05-25 DIAGNOSIS — Z510 Encounter for antineoplastic radiation therapy: Secondary | ICD-10-CM | POA: Diagnosis not present

## 2020-05-25 DIAGNOSIS — I890 Lymphedema, not elsewhere classified: Secondary | ICD-10-CM

## 2020-05-25 NOTE — Therapy (Signed)
[place], [place], [address] Phone: [phone]   Fax:  [phone]  Physical Therapy Treatment  Patient Details  Name: [name] Foster MRN: [id number] Date of Birth: 1951-09-09 Referring Provider (PT): Dr. Lindi Adie   Encounter Date: 05/25/2020   PT End of Session - 05/25/20 1054    Visit Number 10    Number of Visits 26    Date for PT Re-Evaluation 08/10/20    Authorization Type [employer] medicare    PT Start Time 1000    PT Stop Time 1059    PT Time Calculation (min) 59 min    Activity Tolerance Patient tolerated treatment well    Behavior During Therapy [facility] for tasks assessed/performed           Past Medical History:  Diagnosis Date  . Allergic rhinitis   . Anemia   . Aortic valve disorders   . Arthritis   . Asthma   . Cancer [facility])    Breast Cancer  . CHF (congestive heart failure) ([place])   . Coronary artery disease   . Family history of adverse reaction to anesthesia    difficulty waking mother up after surgery  . Family history of breast cancer   . Family history of cervical cancer   . Family history of colon cancer   . Family history of throat cancer   . Heart murmur   . Hyperlipidemia   . Hypertension   . Long term (current) use of anticoagulants   . Other primary cardiomyopathies   . Peripheral vascular disease (HCC)    right leg  . Personal history of colonic polyps   . Personal history of venous thrombosis and embolism     Past Surgical History:  Procedure Laterality Date  . ABDOMINAL HYSTERECTOMY    . BREAST BIOPSY Left 07/2019  . CARDIAC CATHETERIZATION    . COLON SURGERY     colonoscopy  . DILATION AND CURETTAGE OF UTERUS     several  . MASTECTOMY MODIFIED RADICAL Left 03/09/2020   Left Modified Radical Mastectomy (left mastectomy with axillary lymph node dissection)   . MASTECTOMY MODIFIED RADICAL Left 03/09/2020   Procedure: LEFT MODIFIED RADICAL MASTECTOMY;  Surgeon:  Stark Klein, MD;  Location: [place];  Service: General;  Laterality: Left;  GEN AND PECTORAL BLOCK  . PORTACATH PLACEMENT Left 08/18/2019   Procedure: INSERTION PORT-A-CATH;  Surgeon: Stark Klein, MD;  Location: [place];  Service: General;  Laterality: Left;  . Rt knee arthoscopic    . TEE WITHOUT CARDIOVERSION N/A 02/21/2013   Procedure: TRANSESOPHAGEAL ECHOCARDIOGRAM (TEE);  Surgeon: Lelon Perla, MD;  Location[facility] END[facility]COPY;  Service: Cardiovascular;  Laterality: N/A;    There were no vitals filed for this visit.   Subjective Assessment - 05/25/20 1001    Subjective I ordereda new sleeve. Radiation starts tomorrow    Pertinent History Patient was diagnosed on 07/24/2019 with left grade III invasive ductal carcinoma breast cancer. ER/PR negative and HER2 positive with a Ki67 of 70%. neoadjuvant chemotherapy completed.  Lt Mastectomy 03/09/20 with 19 axillary nodes removed and 2 inframammary nodes removed.  All negative.  Radiation consultation on 05/04/20.    Patient Stated Goals swelling down, more mobility, get radiation started    Currently in Pain? No/denies                       Outpatient Rehab from 04/22/2020 in [place]  Lymphedema Life Impact  Scale Total Score 38.24 %            [facility] Adult PT Treatment/Exercise - 05/25/20 0001      Manual Therapy   Manual therapy comments helped pt donn compression sleeve upon leaving.  Made pt 1/2" gray foam for lateral chest to use as needed     Soft tissue mobilization to the left pectoralis during flexion PROM     Manual Lymphatic Drainage (MLD) In Supine: Short neck, superficial and deep abdominals, Lt inguinal and Rt axillary nodes, Lt axillo-inguinal and anterior inter-axillary (avoiding port at Lt superior chest) anastomosis, then Lt UE working from lateral upper arm to dorsum of hand then retracing all steps beginning to instruct pt throughout while performing.  Reviewing all steps  with pt and then perfomring UE steps in seated still needing moderate vcs to not slide and to use the whole hand.      Passive ROM to left shoulder in direction of flexion, abduction and ER with vcs for relaxation                       PT Long Term Goals - 05/25/20 1102      PT LONG TERM GOAL #1   Title Pt will improve her Lt shoulder active flexion and abduction to at least 140 to improve overhead reaching    Status On-going      PT LONG TERM GOAL #2   Title Pt will decrease circumferential measurements by at least 2cm globally proximal to the wrist to demonstrate improved Lt UE edema    Status On-going      PT LONG TERM GOAL #3   Title Pt will obtain appropriate compression garments for the Lt UE to manage lymphedema    Status Partially Met      PT LONG TERM GOAL #4   Title Pt will be ind with final HEP    Status On-going                 Plan - 05/25/20 1100    Clinical Impression Statement Pt starts radiation tomorrow still with pain and limitations with PROM/AROM of the Lt shoulder especially in the pectoralis muscle improved with blocking and STM and gentle relaxation.  Focused on MLD review today with education and cueing still needed to minimize skin sliding and for technique.   Pt educated on pump options as well for the future.    Rehab Potential Excellent    PT Frequency 2x / week    PT Duration 8 weeks    PT Treatment/Interventions ADLs/Self Care Home Management;Therapeutic exercise;Patient/family education;Moist Heat;Therapeutic activities;Manual techniques;Joint Manipulations;Passive range of motion;Cryotherapy;Manual lymph drainage;Compression bandaging;Taping    PT Next Visit Plan AA/A/PROM to L shoulder ROM, remeasure circumferences around 8.25 to see if garment is containing    PT Home Exercise Plan supine flexion punch AAROM, standing dowel flexion, abduction, pendulums, remedial exercises, butterfly stretch    Consulted and Agree with Plan of  Care Patient           Patient will benefit from skilled therapeutic intervention in order to improve the following deficits and impairments:  Postural dysfunction, Decreased knowledge of precautions, Impaired UE functional use, Pain, Decreased range of motion, Decreased strength, Increased edema  Visit Diagnosis: Stiffness of left shoulder, not elsewhere classified  Lymphedema  Abnormal posture  Malignant neoplasm of overlapping sites of left breast in female, estrogen receptor negative ([place])     Problem List Patient Active Problem  List   Diagnosis Date Noted  . Breast cancer metastasized to axillary lymph node, left ([place]) 03/09/2020  . Dehydration   . Non-intractable vomiting   . Intractable nausea and vomiting 09/21/2019  . Generalized weakness 09/21/2019  . AKI (acute kidney injury) ([place]) 09/21/2019  . Leukocytosis 09/21/2019  . Hypernatremia 09/21/2019  . Hyperchloremia 09/21/2019  . Thrombocytopenia ([place]) 09/21/2019  . Port-A-Cath in place 09/08/2019  . Family history of breast cancer   . Family history of throat cancer   . Family history of cervical cancer   . Malignant neoplasm of overlapping sites of left breast in female, estrogen receptor negative ([place]) 08/01/2019  . Family history of colon cancer mom 11/05/2018  . Encounter for therapeutic drug monitoring 11/21/2013  . Asthma with bronchitis 09/01/2013  . Chest pain 01/01/2013  . Aortic insufficiency 10/25/2011  . Chronic anticoagulation 09/29/2011  . Nocturnal dyspnea 09/29/2011  . VITAMIN D DEFICIENCY 08/06/2009  . DVT 01/26/2009  . KNEE PAIN, RIGHT 06/19/2008  . EDEMA 06/19/2008  . ANKLE PAIN, LEFT 02/13/2008  . HYPERKALEMIA 08/20/2007  . HYPOKALEMIA 08/20/2007  . HLD (hyperlipidemia) 07/18/2007  . Cardiomyopathy, hypertensive ([place]) 07/18/2007  . Essential hypertension 06/26/2007  . Hypertensive heart disease with heart failure ([place]) 06/26/2007  . Congestive heart failure ([place]) 06/26/2007  .  Allergic rhinitis, cause unspecified 06/26/2007  . ASTHMA 06/26/2007  . SYMPTOM, SYNCOPE AND COLLAPSE 06/26/2007  . HEADACHE 06/26/2007  . HEART MURMUR, HX OF 06/26/2007  . DVT, HX OF 06/26/2007    Stark Bray 05/25/2020, 11:02 AM  [place], [place], [address] Phone: [phone]   Fax:  [phone]  Name: [name] Foster MRN: [id number] Date of Birth: 1951-05-07

## 2020-05-25 NOTE — Addendum Note (Signed)
Addended by: Shan Levans R on: 05/25/2020 11:04 AM   Modules accepted: Orders

## 2020-05-26 ENCOUNTER — Ambulatory Visit
Admission: RE | Admit: 2020-05-26 | Discharge: 2020-05-26 | Disposition: A | Discharge status: Home / Self Care | Payer: Medicare Other | Source: Ambulatory Visit | Attending: Radiation Oncology | Admitting: Radiation Oncology

## 2020-05-26 ENCOUNTER — Other Ambulatory Visit: Payer: Self-pay

## 2020-05-26 DIAGNOSIS — C50812 Malignant neoplasm of overlapping sites of left female breast: Secondary | ICD-10-CM | POA: Diagnosis not present

## 2020-05-26 DIAGNOSIS — Z171 Estrogen receptor negative status [ER-]: Secondary | ICD-10-CM | POA: Diagnosis not present

## 2020-05-26 DIAGNOSIS — Z510 Encounter for antineoplastic radiation therapy: Secondary | ICD-10-CM | POA: Diagnosis not present

## 2020-05-26 NOTE — Progress Notes (Signed)
Patient Care Team: Panosh, Standley Brooking, MD as PCP - General Stanford Breed, Denice Bors, MD as PCP - Cardiology (Cardiology) Stanford Breed Denice Bors, MD (Cardiology) Susa Day, MD (Orthopedic Surgery) Mauro Kaufmann, RN as Oncology Nurse Navigator Rockwell Germany, RN as Oncology Nurse Navigator  DIAGNOSIS:    ICD-10-CM   1. Malignant neoplasm of overlapping sites of left breast in female, estrogen receptor negative ([place])  C50.812    Z17.1     SUMMARY OF ONCOLOGIC HISTORY: Oncology History  Malignant neoplasm of overlapping sites of left breast in female, estrogen receptor negative ([place])  08/01/2019 Initial Diagnosis   Patient palpated left breast lump and skin thickening. Mammogram showed a 3.6cm mass at 3:30 position, a 0.9cm mass at 3:00 position, a 0.7cm mass at 2:00 position, a 0.5cm mass at 2:00 position, a 0.3cm mass at 1:00 position, and a 0.4cm mass at 1:00 position, with 4 abnormal left axillary lymph nodes. Biopsy showed IDC, grade 3, HER-2 + (3+), ER/PR -, Ki67 70%, in the breast and lymph nodes.    08/06/2019 Cancer Staging   Staging form: Breast, AJCC 8th Edition - Clinical: Stage IIIB (cT4, cN1, cM0, G3, ER-, PR-, HER2+) - Signed by Nicholas Lose, MD on 08/06/2019   08/19/2019 - 01/22/2020 Chemotherapy   The patient had palonosetron (ALOXI) injection 0.25 mg, 0.25 mg, Intravenous,  Once, 6 of 6 cycles Administration: 0.25 mg (08/19/2019), 0.25 mg (09/08/2019), 0.25 mg (12/12/2019), 0.25 mg (01/02/2020), 0.25 mg (10/31/2019), 0.25 mg (11/22/2019) pegfilgrastim-cbqv (UDENYCA) injection 6 mg, 6 mg, Subcutaneous, Once, 2 of 2 cycles Administration: 6 mg (08/21/2019), 6 mg (09/10/2019) CARBOplatin (PARAPLATIN) 480 mg in sodium chloride 0.9 % 250 mL chemo infusion, 480 mg (110.9 % of original dose 429.6 mg), Intravenous,  Once, 2 of 2 cycles Dose modification:   (original dose 429.6 mg, Cycle 1) Administration: 480 mg (08/19/2019), 340 mg (09/08/2019) DOCEtaxel (TAXOTERE) 160 mg in sodium  chloride 0.9 % 250 mL chemo infusion, 75 mg/m2 = 160 mg, Intravenous,  Once, 6 of 6 cycles Dose modification: 60 mg/m2 (original dose 75 mg/m2, Cycle 2, Reason: Dose not tolerated), 50 mg/m2 (original dose 75 mg/m2, Cycle 5, Reason: Dose not tolerated) Administration: 160 mg (08/19/2019), 130 mg (09/08/2019), 110 mg (12/12/2019), 110 mg (01/02/2020), 110 mg (10/31/2019), 110 mg (11/22/2019) pertuzumab (PERJETA) 420 mg in sodium chloride 0.9 % 250 mL chemo infusion, 420 mg (100 % of original dose 420 mg), Intravenous, Once, 2 of 2 cycles Dose modification: 420 mg (original dose 420 mg, Cycle 1, Reason: Provider Judgment) Administration: 420 mg (08/19/2019), 420 mg (09/08/2019) fosaprepitant (EMEND) 150 mg, dexamethasone (DECADRON) 12 mg in sodium chloride 0.9 % 145 mL IVPB, , Intravenous,  Once, 3 of 3 cycles Administration:  (08/19/2019),  (09/08/2019),  (10/31/2019) trastuzumab-anns (KANJINTI) 750 mg in sodium chloride 0.9 % 250 mL chemo infusion, 756 mg (100 % of original dose 8 mg/kg), Intravenous,  Once, 6 of 6 cycles Dose modification: 8 mg/kg (original dose 8 mg/kg, Cycle 1, Reason: Other (see comments), Comment: change to approved brand), 6 mg/kg (original dose 6 mg/kg, Cycle 2, Reason: Other (see comments), Comment: brand change per insurance) Administration: 750 mg (08/19/2019), 567 mg (09/08/2019), 567 mg (10/31/2019), 567 mg (11/22/2019), 567 mg (12/12/2019), 504 mg (01/02/2020)  for chemotherapy treatment.    09/21/2019 - 10/03/2019 Hospital Admission   Intractable nausea and vomiting   01/23/2020 -  Chemotherapy   The patient had trastuzumab-anns (KANJINTI) 504 mg in sodium chloride 0.9 % 250 mL chemo infusion, 6 mg/kg =  504 mg (100 % of original dose 6 mg/kg), Intravenous,  Once, 5 of 10 cycles Dose modification: 6 mg/kg (original dose 6 mg/kg, Cycle 1, Reason: Other (see comments), Comment: biosimilar conversion; auth'd for Kanjinti) Administration: 504 mg (01/23/2020), 504 mg (02/13/2020), 504 mg  (03/05/2020), 504 mg (04/16/2020), 504 mg (05/06/2020)  for chemotherapy treatment.    03/09/2020 Surgery   Left mastectomy and left axillary lymph node dissection ([place]): no residual carcinoma, 2 negative intramammary lymph nodes, and 19 negative left axillary lymph nodes.      CHIEF COMPLIANT: Follow-up of Herceptin maintenance  INTERVAL HISTORY: [name] Foster is a 69 y.o. with above-mentioned history of left breast cancer whocompletedneoadjuvant chemotherapy, underwent a left mastectomy, and is currently on maintenance Herceptin and undergoing radiation.She presents to the clinic today for treatment.   ALLERGIES:  is allergic to penicillins, tetanus toxoid, and aspirin.  MEDICATIONS:  Current Outpatient Medications  Medication Sig Dispense Refill  . Albuterol Sulfate (PROAIR RESPICLICK) 786 (90 Base) MCG/ACT AEPB Inhale 2 puffs into the lungs every 6 (six) hours as needed. (Patient taking differently: Inhale 2 puffs into the lungs every 6 (six) hours as needed (SOB / wheezing). ) 2 each 1  . amLODipine (NORVASC) 10 MG tablet TAKE 1 TABLET BY MOUTH  DAILY (Patient taking differently: Take 10 mg by mouth daily. ) 90 tablet 3  . atorvastatin (LIPITOR) 20 MG tablet TAKE 1 TABLET BY MOUTH  DAILY 90 tablet 1  . Carboxymethylcellul-Glycerin (CLEAR EYES FOR DRY EYES) 1-0.25 % SOLN Place 1 drop into both eyes 2 (two) times daily as needed (dry eyes).    . Cholecalciferol (VITAMIN D) 50 MCG (2000 UT) tablet Take 2,000 Units by mouth daily.    [place] Kitchen enoxaparin (LOVENOX) 80 MG/0.8ML injection Inject 0.8 mLs (80 mg total) into the skin daily for 11 days. To begin on 03/04/2020 8.8 mL 0  . fexofenadine (ALLEGRA) 180 MG tablet Take 180 mg by mouth daily as needed for allergies.     . fluticasone (FLONASE) 50 MCG/ACT nasal spray USE 2 SPRAYS IN EACH NOSTRIL DAILY. PT NEEDS TO SCHEDULE A FOLLOW UP APPT BEFORE NEXT REFILL. (Patient taking differently: Place 2 sprays into both nostrils daily. ) 16 g 0  .  hydrALAZINE (APRESOLINE) 50 MG tablet Take 0.5 tablets (25 mg total) by mouth 3 (three) times daily. 1 tablet 0  . lidocaine-prilocaine (EMLA) cream Apply 1 application topically as directed. To affected area once    . methocarbamol (ROBAXIN) 500 MG tablet Take 1 tablet (500 mg total) by mouth every 6 (six) hours as needed for muscle spasms. 20 tablet 1  . metoprolol tartrate (LOPRESSOR) 50 MG tablet TAKE 1 TABLET BY MOUTH  TWICE DAILY 180 tablet 1  . ondansetron (ZOFRAN-ODT) 4 MG disintegrating tablet Take 1 tablet (4 mg total) by mouth every 6 (six) hours as needed for nausea. 20 tablet 0  . sodium chloride (OCEAN) 0.65 % SOLN nasal spray Place 1 spray into both nostrils as needed for congestion.    . traMADol (ULTRAM) 50 MG tablet Take 1 tablet (50 mg total) by mouth every 6 (six) hours as needed (mild pain). 20 tablet 0  . warfarin (COUMADIN) 5 MG tablet TAKE 1 TABLET BY MOUTH  DAILY OR AS DIRECTED BY  ANTICOAGULATION CLINIC 100 tablet 1   No current facility-administered medications for this visit.    PHYSICAL EXAMINATION: ECOG PERFORMANCE STATUS: 1 - Symptomatic but completely ambulatory  Vitals:   05/27/20 0934  BP: 136/79  Pulse:  65  Resp: 18  Temp: (!) 97.1 F (36.2 C)  SpO2: 99%   Filed Weights   05/27/20 0934  Weight: 197 lb 1.6 oz (89.4 kg)    BREAST: No palpable masses or nodules in either right or left breasts. No palpable axillary supraclavicular or infraclavicular adenopathy no breast tenderness or nipple discharge. (exam performed in the presence of a chaperone)  LABORATORY DATA:  I have reviewed the data as listed CMP Latest Ref Rng & Units 04/16/2020 03/11/2020 03/10/2020  Glucose 70 - 99 mg/dL 98 126(H) 142(H)  BUN 8 - 23 mg/dL 16 32(H) 22  Creatinine 0.44 - 1.00 mg/dL 1.75(H) 2.61(H) 2.76(H)  Sodium 135 - 145 mmol/L 144 140 139  Potassium 3.5 - 5.1 mmol/L 3.8 4.1 4.5  Chloride 98 - 111 mmol/L 110 111 108  CO2 22 - 32 mmol/L 23 21(L) 22  Calcium 8.9 - 10.3  mg/dL 9.2 7.9(L) 8.4(L)  Total Protein 6.5 - 8.1 g/dL 7.1 - -  Total Bilirubin 0.3 - 1.2 mg/dL 0.3 - -  Alkaline Phos 38 - 126 U/L 122 - -  AST 15 - 41 U/L 24 - -  ALT 0 - 44 U/L 26 - -    Lab Results  Component Value Date   WBC 8.4 04/16/2020   HGB 10.1 (L) 04/16/2020   HCT 32.7 (L) 04/16/2020   MCV 89.1 04/16/2020   PLT 239 04/16/2020   NEUTROABS 5.6 04/16/2020    ASSESSMENT & PLAN:  Malignant neoplasm of overlapping sites of left breast in female, estrogen receptor negative ([place]) 07/31/2020:Patient palpated left breast lump and skin thickening. Mammogram showed a 3.6cm mass at 3:30 position, a 0.9cm mass at 3:00 position, a 0.7cm mass at 2:00 position, a 0.5cm mass at 2:00 position, a 0.3cm mass at 1:00 position, and a 0.4cm mass at 1:00 position, with 4 abnormal left axillary lymph nodes. Biopsy showed IDC, grade 3, HER-2 + (3+), ER/PR -, Ki67 70%, in the breast and lymph nodes.   Treatment plan 1. Neoadjuvant chemotherapy with TCH Perjeta 6 cycles completed 01/02/2020 followed by Herceptinmaintenance  2. Followed bymastectomy withaxillary lymph node dissection 3. Followed by adjuvant radiation therapy  03/09/2020: Left mastectomy and left axillary lymph node dissection ([place]): no residual carcinoma, 2 negative intramammary lymph nodes, and 19 negative left axillary lymph nodes.  --------------------------------------------------------------------------------------------------------------------------------------------- Current treatment: Maintenance Herceptin Herceptin toxicity: Occasional loose stools  05/19/2020: EF 50 to 55%: Same as previous Left arm lymphedema: Physical therapy Currently undergoing radiation.  Return to clinic every 3 weeks for maintenance therapy and every 6 weeks for follow-up with me  No orders of the defined types were placed in this encounter.  The patient has a good understanding of the overall plan. she agrees with it. she will call with  any problems that may develop before the next visit here.  Total time spent: 30 mins including face to face time and time spent for planning, charting and coordination of care  Nicholas Lose, MD 05/27/2020  I, Cloyde Reams Dorshimer, am acting as scribe for Dr. Nicholas Lose.  I have reviewed the above documentation for accuracy and completeness, and I agree with the above.

## 2020-05-27 ENCOUNTER — Inpatient Hospital Stay: Payer: Medicare Other

## 2020-05-27 ENCOUNTER — Inpatient Hospital Stay (HOSPITAL_BASED_OUTPATIENT_CLINIC_OR_DEPARTMENT_OTHER): Payer: Medicare Other | Admitting: Hematology and Oncology

## 2020-05-27 ENCOUNTER — Ambulatory Visit
Admission: RE | Admit: 2020-05-27 | Discharge: 2020-05-27 | Disposition: A | Discharge status: Home / Self Care | Payer: Medicare Other | Source: Ambulatory Visit | Attending: Radiation Oncology | Admitting: Radiation Oncology

## 2020-05-27 DIAGNOSIS — C50812 Malignant neoplasm of overlapping sites of left female breast: Secondary | ICD-10-CM

## 2020-05-27 DIAGNOSIS — Z86718 Personal history of other venous thrombosis and embolism: Secondary | ICD-10-CM

## 2020-05-27 DIAGNOSIS — Z510 Encounter for antineoplastic radiation therapy: Secondary | ICD-10-CM | POA: Diagnosis not present

## 2020-05-27 DIAGNOSIS — Z7901 Long term (current) use of anticoagulants: Secondary | ICD-10-CM | POA: Diagnosis not present

## 2020-05-27 DIAGNOSIS — Z5112 Encounter for antineoplastic immunotherapy: Secondary | ICD-10-CM | POA: Diagnosis not present

## 2020-05-27 DIAGNOSIS — Z171 Estrogen receptor negative status [ER-]: Secondary | ICD-10-CM

## 2020-05-27 DIAGNOSIS — C773 Secondary and unspecified malignant neoplasm of axilla and upper limb lymph nodes: Secondary | ICD-10-CM

## 2020-05-27 LAB — CMP (CANCER CENTER ONLY)
ALT: 16 U/L (ref 0–44)
AST: 19 U/L (ref 15–41)
Albumin: 3.6 g/dL (ref 3.5–5.0)
Alkaline Phosphatase: 121 U/L (ref 38–126)
Anion gap: 6 (ref 5–15)
BUN: 21 mg/dL (ref 8–23)
CO2: 29 mmol/L (ref 22–32)
Calcium: 9.3 mg/dL (ref 8.9–10.3)
Chloride: 108 mmol/L (ref 98–111)
Creatinine: 1.77 mg/dL — ABNORMAL HIGH (ref 0.44–1.00)
GFR, Est AFR Am: 34 mL/min — ABNORMAL LOW (ref >60)
GFR, Estimated: 29 mL/min — ABNORMAL LOW (ref >60)
Glucose, Bld: 86 mg/dL (ref 70–99)
Potassium: 3.7 mmol/L (ref 3.5–5.1)
Sodium: 143 mmol/L (ref 135–145)
Total Bilirubin: 0.3 mg/dL (ref 0.3–1.2)
Total Protein: 7.1 g/dL (ref 6.5–8.1)

## 2020-05-27 LAB — CBC WITH DIFFERENTIAL (CANCER CENTER ONLY)
Abs Immature Granulocytes: 0.01 10*3/uL (ref 0.00–0.07)
Basophils Absolute: 0 10*3/uL (ref 0.0–0.1)
Basophils Relative: 0 %
Eosinophils Absolute: 0.1 10*3/uL (ref 0.0–0.5)
Eosinophils Relative: 1 %
HCT: 34.7 % — ABNORMAL LOW (ref 36.0–46.0)
Hemoglobin: 10.6 g/dL — ABNORMAL LOW (ref 12.0–15.0)
Immature Granulocytes: 0 %
Lymphocytes Relative: 38 %
Lymphs Abs: 2.8 10*3/uL (ref 0.7–4.0)
MCH: 27.5 pg (ref 26.0–34.0)
MCHC: 30.5 g/dL (ref 30.0–36.0)
MCV: 89.9 fL (ref 80.0–100.0)
Monocytes Absolute: 0.5 10*3/uL (ref 0.1–1.0)
Monocytes Relative: 6 %
Neutro Abs: 4.1 10*3/uL (ref 1.7–7.7)
Neutrophils Relative %: 55 %
Platelet Count: 277 10*3/uL (ref 150–400)
RBC: 3.86 MIL/uL — ABNORMAL LOW (ref 3.87–5.11)
RDW: 14.4 % (ref 11.5–15.5)
WBC Count: 7.4 10*3/uL (ref 4.0–10.5)
nRBC: 0 % (ref 0.0–0.2)

## 2020-05-27 LAB — PROTIME-INR
INR: 2.7 — ABNORMAL HIGH (ref 0.8–1.2)
Prothrombin Time: 27.6 seconds — ABNORMAL HIGH (ref 11.4–15.2)

## 2020-05-27 MED ORDER — ACETAMINOPHEN 325 MG PO TABS
ORAL_TABLET | ORAL | Status: AC
  Filled 2020-05-27: qty 2

## 2020-05-27 MED ORDER — TRASTUZUMAB-ANNS CHEMO 150 MG IV SOLR
6.0000 mg/kg | Freq: Once | INTRAVENOUS | Status: AC
  Administered 2020-05-27: 504 mg via INTRAVENOUS
  Filled 2020-05-27: qty 24

## 2020-05-27 MED ORDER — SODIUM CHLORIDE 0.9% FLUSH
10.0000 mL | INTRAVENOUS | Status: DC | PRN
  Administered 2020-05-27: 10 mL
  Filled 2020-05-27: qty 10

## 2020-05-27 MED ORDER — SODIUM CHLORIDE 0.9 % IV SOLN
Freq: Once | INTRAVENOUS | Status: AC
  Filled 2020-05-27: qty 250

## 2020-05-27 MED ORDER — DIPHENHYDRAMINE HCL 25 MG PO CAPS
50.0000 mg | ORAL_CAPSULE | Freq: Once | ORAL | Status: AC
  Administered 2020-05-27: 50 mg via ORAL

## 2020-05-27 MED ORDER — HEPARIN SOD (PORK) LOCK FLUSH 100 UNIT/ML IV SOLN
500.0000 [IU] | Freq: Once | INTRAVENOUS | Status: AC | PRN
  Administered 2020-05-27: 500 [IU]
  Filled 2020-05-27: qty 5

## 2020-05-27 MED ORDER — ACETAMINOPHEN 325 MG PO TABS
650.0000 mg | ORAL_TABLET | Freq: Once | ORAL | Status: AC
  Administered 2020-05-27: 650 mg via ORAL

## 2020-05-27 MED ORDER — DIPHENHYDRAMINE HCL 25 MG PO CAPS
ORAL_CAPSULE | ORAL | Status: AC
  Filled 2020-05-27: qty 2

## 2020-05-27 NOTE — Patient Instructions (Signed)
[facility] Discharge Instructions for Patients Receiving Chemotherapy  Today you received the following chemotherapy agents: Kanjinti  To help prevent nausea and vomiting after your treatment, we encourage you to take your nausea medication as directed.    If you develop nausea and vomiting that is not controlled by your nausea medication, call the clinic.   BELOW ARE SYMPTOMS THAT SHOULD BE REPORTED IMMEDIATELY:  *FEVER GREATER THAN 100.5 F  *CHILLS WITH OR WITHOUT FEVER  NAUSEA AND VOMITING THAT IS NOT CONTROLLED WITH YOUR NAUSEA MEDICATION  *UNUSUAL SHORTNESS OF BREATH  *UNUSUAL BRUISING OR BLEEDING  TENDERNESS IN MOUTH AND THROAT WITH OR WITHOUT PRESENCE OF ULCERS  *URINARY PROBLEMS  *BOWEL PROBLEMS  UNUSUAL RASH Items with * indicate a potential emergency and should be followed up as soon as possible.  Feel free to call the clinic should you have any questions or concerns. The clinic phone number is [phone].  Please show the CHEMO ALERT CARD at check-in to the Emergency Department and triage nurse.   

## 2020-05-27 NOTE — Assessment & Plan Note (Signed)
07/31/2020:Patient palpated left breast lump and skin thickening. Mammogram showed a 3.6cm mass at 3:30 position, a 0.9cm mass at 3:00 position, a 0.7cm mass at 2:00 position, a 0.5cm mass at 2:00 position, a 0.3cm mass at 1:00 position, and a 0.4cm mass at 1:00 position, with 4 abnormal left axillary lymph nodes. Biopsy showed IDC, grade 3, HER-2 + (3+), ER/PR -, Ki67 70%, in the breast and lymph nodes.   Treatment plan 1. Neoadjuvant chemotherapy with TCH Perjeta 6 cycles completed 01/02/2020 followed by Herceptinmaintenance  2. Followed bymastectomy withaxillary lymph node dissection 3. Followed by adjuvant radiation therapy  03/09/2020: Left mastectomy and left axillary lymph node dissection ([place]): no residual carcinoma, 2 negative intramammary lymph nodes, and 19 negative left axillary lymph nodes.  --------------------------------------------------------------------------------------------------------------------------------------------- Current treatment: Maintenance Herceptin and Perjeta 05/19/2020: EF 50 to 55%: Same as previous Left arm lymphedema: Physical therapy  Return to clinic every 3 weeks for maintenance therapy and every 6 weeks for follow-up with me Return to clinic every 3 weeks for Herceptin every 6 weeks to follow-up with me

## 2020-05-27 NOTE — Progress Notes (Signed)
Pt here for patient teaching.  Pt given Radiation and You booklet, skin care instructions, Alra deodorant and Sonafine.  Reviewed areas of pertinence such as fatigue, hair loss, skin changes, breast tenderness and breast swelling . Pt able to give teach back of to pat skin and use unscented/gentle soap,apply Sonafine bid, avoid applying anything to skin within 4 hours of treatment, avoid wearing an under wire bra and to use an electric razor if they must shave. Pt verbalizes understanding of information given and will contact nursing with any questions or concerns.     Kellan Boehlke M. Alydia Gosser RN, BSN             

## 2020-05-28 ENCOUNTER — Telehealth: Payer: Self-pay | Admitting: Hematology and Oncology

## 2020-05-28 ENCOUNTER — Other Ambulatory Visit: Payer: Self-pay

## 2020-05-28 ENCOUNTER — Encounter: Payer: Self-pay | Admitting: Rehabilitation

## 2020-05-28 ENCOUNTER — Ambulatory Visit
Admission: RE | Admit: 2020-05-28 | Discharge: 2020-05-28 | Disposition: A | Discharge status: Home / Self Care | Payer: Medicare Other | Source: Ambulatory Visit | Attending: Radiation Oncology | Admitting: Radiation Oncology

## 2020-05-28 ENCOUNTER — Ambulatory Visit: Payer: Medicare Other | Admitting: Rehabilitation

## 2020-05-28 DIAGNOSIS — Z510 Encounter for antineoplastic radiation therapy: Secondary | ICD-10-CM | POA: Diagnosis not present

## 2020-05-28 DIAGNOSIS — Z171 Estrogen receptor negative status [ER-]: Secondary | ICD-10-CM

## 2020-05-28 DIAGNOSIS — I890 Lymphedema, not elsewhere classified: Secondary | ICD-10-CM | POA: Diagnosis not present

## 2020-05-28 DIAGNOSIS — C50812 Malignant neoplasm of overlapping sites of left female breast: Secondary | ICD-10-CM | POA: Diagnosis not present

## 2020-05-28 DIAGNOSIS — R293 Abnormal posture: Secondary | ICD-10-CM

## 2020-05-28 DIAGNOSIS — M25612 Stiffness of left shoulder, not elsewhere classified: Secondary | ICD-10-CM | POA: Diagnosis not present

## 2020-05-28 MED ORDER — SONAFINE EX EMUL
1.0000 "application " | Freq: Once | CUTANEOUS | Status: AC
  Administered 2020-05-28: 1 via TOPICAL

## 2020-05-28 MED ORDER — ALRA NON-METALLIC DEODORANT (RAD-ONC)
1.0000 "application " | Freq: Once | TOPICAL | Status: AC
  Administered 2020-05-28: 1 via TOPICAL

## 2020-05-28 NOTE — Telephone Encounter (Signed)
No 8/26 los, no changes made to pt schedule

## 2020-05-28 NOTE — Therapy (Signed)
[place], [place], [address] Phone: [phone]   Fax:  [phone]  Physical Therapy Treatment  Patient Details  Name: [name] Foster MRN: [id number] Date of Birth: [birth year] Referring Provider (PT): Dr. Lindi Adie   Encounter Date: 05/28/2020   PT End of Session - 05/28/20 1206    Visit Number 11    Number of Visits 26    Date for PT Re-Evaluation 08/10/20    Authorization Type UHC medicare    PT Start Time 1130    PT Stop Time 1205    PT Time Calculation (min) 35 min    Activity Tolerance Patient tolerated treatment well    Behavior During Therapy [facility] for tasks assessed/performed           Past Medical History:  Diagnosis Date  . Allergic rhinitis   . Anemia   . Aortic valve disorders   . Arthritis   . Asthma   . Cancer [facility])    Breast Cancer  . CHF (congestive heart failure) ([place])   . Coronary artery disease   . Family history of adverse reaction to anesthesia    difficulty waking mother up after surgery  . Family history of breast cancer   . Family history of cervical cancer   . Family history of colon cancer   . Family history of throat cancer   . Heart murmur   . Hyperlipidemia   . Hypertension   . Long term (current) use of anticoagulants   . Other primary cardiomyopathies   . Peripheral vascular disease (HCC)    right leg  . Personal history of colonic polyps   . Personal history of venous thrombosis and embolism     Past Surgical History:  Procedure Laterality Date  . ABDOMINAL HYSTERECTOMY    . BREAST BIOPSY Left 07/2019  . CARDIAC CATHETERIZATION    . COLON SURGERY     colonoscopy  . DILATION AND CURETTAGE OF UTERUS     several  . MASTECTOMY MODIFIED RADICAL Left 03/09/2020   Left Modified Radical Mastectomy (left mastectomy with axillary lymph node dissection)   . MASTECTOMY MODIFIED RADICAL Left 03/09/2020   Procedure: LEFT MODIFIED RADICAL MASTECTOMY;  Surgeon:  Stark Klein, MD;  Location: [place];  Service: General;  Laterality: Left;  GEN AND PECTORAL BLOCK  . PORTACATH PLACEMENT Left 08/18/2019   Procedure: INSERTION PORT-A-CATH;  Surgeon: Stark Klein, MD;  Location: [place];  Service: General;  Laterality: Left;  . Rt knee arthoscopic    . TEE WITHOUT CARDIOVERSION N/A 02/21/2013   Procedure: TRANSESOPHAGEAL ECHOCARDIOGRAM (TEE);  Surgeon: Lelon Perla, MD;  Location: [facility] ENDOSCOPY;  Service: Cardiovascular;  Laterality: N/A;    There were no vitals filed for this visit.   Subjective Assessment - 05/28/20 1128    Subjective I am late from radiation.  I have not gotten my new sleeve    Pertinent History Patient was diagnosed on 07/24/2019 with left grade III invasive ductal carcinoma breast cancer. ER/PR negative and HER2 positive with a Ki67 of 70%. neoadjuvant chemotherapy completed.  Lt Mastectomy 03/09/20 with 19 axillary nodes removed and 2 inframammary nodes removed.  All negative.  Radiation consultation on 05/04/20.    Currently in Pain? No/denies                       Outpatient Rehab from [facility]/22/2020 [facility]  Lymphedema Life Impact Scale Total Score 38.24 %  [place] Adult PT Treatment/Exercise - 05/28/20 0001      Manual Therapy   Manual therapy comments pt decided to go get a compression bra as the side of the chest has felt a bit more swollen    Soft tissue mobilization to the left pectoralis during flexion PROM     Manual Lymphatic Drainage (MLD) In Supine: Short neck, superficial and deep abdominals, Lt inguinal and Rt axillary nodes, Lt axillo-inguinal and anterior inter-axillary (avoiding port at Lt superior chest) anastomosis, then Lt UE working from lateral upper arm to dorsum of hand then retracing all steps beginning to instruct pt throughout while performing.  Reviewing all steps with pt and then perfomring UE steps in seated still needing moderate vcs to not  slide and to use the whole hand.   Some distal to proximal sweeping with coconut oil for general lymphatic movement                       PT Long Term Goals - 05/25/20 1102      PT LONG TERM GOAL #1   Title Pt will improve her Lt shoulder active flexion and abduction to at least 140 to improve overhead reaching    Status On-going      PT LONG TERM GOAL #2   Title Pt will decrease circumferential measurements by at least 2cm globally proximal to the wrist to demonstrate improved Lt UE edema    Status On-going      PT LONG TERM GOAL #3   Title Pt will obtain appropriate compression garments for the Lt UE to manage lymphedema    Status Partially Met      PT LONG TERM GOAL #4   Title Pt will be ind with final HEP    Status On-going                 Plan - 05/28/20 1207    Clinical Impression Statement Pt arrives late from radiation today with Lt UE appearing more firm and slightly larger.  pt reports she has not had the sleeve on all morning.  Advised pt to wear the sleeve as much as possible and we will measure next time to see if it is containing.  If not may need to do bandaging/teach bandaging, order a velcro garment for day/night.  Focused on Lt UE MLD    PT Frequency 2x / week    PT Duration 8 weeks    PT Treatment/Interventions ADLs/Self Care Home Management;Therapeutic exercise;Patient/family education;Moist Heat;Therapeutic activities;Manual techniques;Joint Manipulations;Passive range of motion;Cryotherapy;Manual lymph drainage;Compression bandaging;Taping    PT Next Visit Plan measure arm if pt has been wearing sleeve  and continue AA/A/PROM to L shoulder ROM    Consulted and Agree with Plan of Care Patient           Patient will benefit from skilled therapeutic intervention in order to improve the following deficits and impairments:     Visit Diagnosis: Stiffness of left shoulder, not elsewhere classified  Abnormal posture  Malignant neoplasm of  overlapping sites of left breast in female, estrogen receptor negative ([place])     Problem List Patient Active Problem List   Diagnosis Date Noted  . Breast cancer metastasized to axillary lymph node, left ([place]) 03/09/2020  . Dehydration   . Non-intractable vomiting   . Intractable nausea and vomiting 09/21/2019  . Generalized weakness 09/21/2019  . AKI (acute kidney injury) ([place]) 09/21/2019  . Leukocytosis 09/21/2019  . Hypernatremia 09/21/2019  .  Hyperchloremia 09/21/2019  . Thrombocytopenia ([place]) 09/21/2019  . Port-A-Cath in place 09/08/2019  . Family history of breast cancer   . Family history of throat cancer   . Family history of cervical cancer   . Malignant neoplasm of overlapping sites of left breast in female, estrogen receptor negative ([place]) 08/01/2019  . Family history of colon cancer mom 11/05/2018  . Encounter for therapeutic drug monitoring 11/21/2013  . Asthma with bronchitis 09/01/2013  . Chest pain 01/01/2013  . Aortic insufficiency 10/25/2011  . Chronic anticoagulation 09/29/2011  . Nocturnal dyspnea 09/29/2011  . VITAMIN D DEFICIENCY 08/06/2009  . DVT 01/26/2009  . KNEE PAIN, RIGHT 06/19/2008  . EDEMA 06/19/2008  . ANKLE PAIN, LEFT 02/13/2008  . HYPERKALEMIA 08/20/2007  . HYPOKALEMIA 08/20/2007  . HLD (hyperlipidemia) 07/18/2007  . Cardiomyopathy, hypertensive ([place]) 07/18/2007  . Essential hypertension 06/26/2007  . Hypertensive heart disease with heart failure ([place]) 06/26/2007  . Congestive heart failure ([place]) 06/26/2007  . Allergic rhinitis, cause unspecified 06/26/2007  . ASTHMA 06/26/2007  . SYMPTOM, SYNCOPE AND COLLAPSE 06/26/2007  . HEADACHE 06/26/2007  . HEART MURMUR, HX OF 06/26/2007  . DVT, HX OF 06/26/2007    Shan Levans, PT  05/28/2020, 12:12 PM  [place], [place], [address] Phone: [phone]   Fax:  [phone]  Name: [name] Foster MRN:  [id number] Date of Birth: 01-22-1951

## 2020-05-31 ENCOUNTER — Ambulatory Visit
Admission: RE | Admit: 2020-05-31 | Discharge: 2020-05-31 | Disposition: A | Discharge status: Home / Self Care | Payer: Medicare Other | Source: Ambulatory Visit | Attending: Radiation Oncology | Admitting: Radiation Oncology

## 2020-05-31 DIAGNOSIS — Z171 Estrogen receptor negative status [ER-]: Secondary | ICD-10-CM | POA: Diagnosis not present

## 2020-05-31 DIAGNOSIS — Z510 Encounter for antineoplastic radiation therapy: Secondary | ICD-10-CM | POA: Diagnosis not present

## 2020-05-31 DIAGNOSIS — C50812 Malignant neoplasm of overlapping sites of left female breast: Secondary | ICD-10-CM | POA: Diagnosis not present

## 2020-06-01 ENCOUNTER — Ambulatory Visit
Admission: RE | Admit: 2020-06-01 | Discharge: 2020-06-01 | Disposition: A | Discharge status: Home / Self Care | Payer: Medicare Other | Source: Ambulatory Visit | Attending: Radiation Oncology | Admitting: Radiation Oncology

## 2020-06-01 ENCOUNTER — Ambulatory Visit: Payer: Medicare Other | Admitting: Rehabilitation

## 2020-06-01 ENCOUNTER — Encounter: Payer: Self-pay | Admitting: Rehabilitation

## 2020-06-01 ENCOUNTER — Other Ambulatory Visit: Payer: Self-pay

## 2020-06-01 DIAGNOSIS — I890 Lymphedema, not elsewhere classified: Secondary | ICD-10-CM | POA: Diagnosis not present

## 2020-06-01 DIAGNOSIS — R293 Abnormal posture: Secondary | ICD-10-CM

## 2020-06-01 DIAGNOSIS — Z171 Estrogen receptor negative status [ER-]: Secondary | ICD-10-CM

## 2020-06-01 DIAGNOSIS — Z510 Encounter for antineoplastic radiation therapy: Secondary | ICD-10-CM | POA: Diagnosis not present

## 2020-06-01 DIAGNOSIS — M25612 Stiffness of left shoulder, not elsewhere classified: Secondary | ICD-10-CM | POA: Diagnosis not present

## 2020-06-01 DIAGNOSIS — C50812 Malignant neoplasm of overlapping sites of left female breast: Secondary | ICD-10-CM | POA: Diagnosis not present

## 2020-06-01 NOTE — Therapy (Signed)
[place], [place], [address] Phone: [phone]   Fax:  [phone]  Physical Therapy Treatment  Patient Details  Name: [name] Foster MRN: [id number] Date of Birth: 11-28-1950 Referring Provider (PT): Dr. Lindi Adie   Encounter Date: 06/01/2020   PT End of Session - 06/01/20 1157    Visit Number 12    Number of Visits 26    Date for PT Re-Evaluation 08/10/20    Authorization Type UHC medicare    PT Start Time 1000    PT Stop Time 1053    PT Time Calculation (min) 53 min    Activity Tolerance Patient tolerated treatment well    Behavior During Therapy [facility] for tasks assessed/performed           Past Medical History:  Diagnosis Date  . Allergic rhinitis   . Anemia   . Aortic valve disorders   . Arthritis   . Asthma   . Cancer [facility])    Breast Cancer  . CHF (congestive heart failure) ([place])   . Coronary artery disease   . Family history of adverse reaction to anesthesia    difficulty waking mother up after surgery  . Family history of breast cancer   . Family history of cervical cancer   . Family history of colon cancer   . Family history of throat cancer   . Heart murmur   . Hyperlipidemia   . Hypertension   . Long term (current) use of anticoagulants   . Other primary cardiomyopathies   . Peripheral vascular disease (HCC)    right leg  . Personal history of colonic polyps   . Personal history of venous thrombosis and embolism     Past Surgical History:  Procedure Laterality Date  . ABDOMINAL HYSTERECTOMY    . BREAST BIOPSY Left 07/2019  . CARDIAC CATHETERIZATION    . COLON SURGERY     colonoscopy  . DILATION AND CURETTAGE OF UTERUS     several  . MASTECTOMY MODIFIED RADICAL Left 03/09/2020   Left Modified Radical Mastectomy (left mastectomy with axillary lymph node dissection)   . MASTECTOMY MODIFIED RADICAL Left 03/09/2020   Procedure: LEFT MODIFIED RADICAL MASTECTOMY;  Surgeon:  Stark Klein, MD;  Location: [place];  Service: General;  Laterality: Left;  GEN AND PECTORAL BLOCK  . PORTACATH PLACEMENT Left 08/18/2019   Procedure: INSERTION PORT-A-CATH;  Surgeon: Stark Klein, MD;  Location: [place];  Service: General;  Laterality: Left;  . Rt knee arthoscopic    . TEE WITHOUT CARDIOVERSION N/A 02/21/2013   Procedure: TRANSESOPHAGEAL ECHOCARDIOGRAM (TEE);  Surgeon: Lelon Perla, MD;  Location: [facility] ENDOSCOPY;  Service: Cardiovascular;  Laterality: N/A;    There were no vitals filed for this visit.   Subjective Assessment - 06/01/20 0958    Subjective I have not gotten the new sleeve.    Pertinent History Patient was diagnosed on 07/24/2019 with left grade III invasive ductal carcinoma breast cancer. ER/PR negative and HER2 positive with a Ki67 of 70%. neoadjuvant chemotherapy completed.  Lt Mastectomy 03/09/20 with 19 axillary nodes removed and 2 inframammary nodes removed.  All negative.  Radiation consultation on 05/04/20.    Patient Stated Goals swelling down, more mobility, get radiation started    Currently in Pain? No/denies                 LYMPHEDEMA/ONCOLOGY QUESTIONNAIRE - 06/01/20 0001      Left Upper Extremity Lymphedema   15 cm Proximal  to Olecranon Process 39 cm    10 cm Proximal to Olecranon Process 37.7 cm    Olecranon Process 31.4 cm    15 cm Proximal to Ulnar Styloid Process 30.5 cm    10 cm Proximal to Ulnar Styloid Process 27.3 cm    Just Proximal to Ulnar Styloid Process 20.4 cm    Across Hand at [employer] 21.2 cm    At [place] of 2nd Digit 6.7 cm                Outpatient Rehab from [facility]/22/2020 [facility]  Lymphedema Life Impact Scale Total Score 38.24 %            [facility] Adult PT Treatment/Exercise - 06/01/20 0001      Shoulder Exercises: Pulleys   Flexion 2 minutes      Manual Therapy   Manual Therapy Edema management    Manual therapy comments pt will be measured on Friday for  circaid using [employer].  Will need to sign form and make prescription.      Edema Management due to increase in size and firmness of forearm pt will resume bandaging at home and gave pt gray foam to try.  Pt has video links    Manual Lymphatic Drainage (MLD) In Supine: Short neck, superficial and deep abdominals, Lt inguinal and Rt axillary nodes, Lt axillo-inguinal and anterior inter-axillary (avoiding port at Lt superior chest) anastomosis, then Lt UE working from lateral upper arm to dorsum of hand then retracing all steps with focus on forearm both sides.  Some distal to proximal sweeping  for general lymphatic movement                       PT Long Term Goals - 05/25/20 1102      PT LONG TERM GOAL #1   Title Pt will improve her Lt shoulder active flexion and abduction to at least 140 to improve overhead reaching    Status On-going      PT LONG TERM GOAL #2   Title Pt will decrease circumferential measurements by at least 2cm globally proximal to the wrist to demonstrate improved Lt UE edema    Status On-going      PT LONG TERM GOAL #3   Title Pt will obtain appropriate compression garments for the Lt UE to manage lymphedema    Status Partially Met      PT LONG TERM GOAL #4   Title Pt will be ind with final HEP    Status On-going                 Plan - 06/01/20 1157    Clinical Impression Statement Pt has radiation immediately post PT so no STM was performed with lotion.  Focused on decongestion of the Lt forearm with increase in measurements around 1cm only in the forearm and with increased fibrosis from wrist to elbow.  Pt will bandage at home again and was agreeable to getting a velcro garment for night and day use using [employer] this Friday.  Softening and decreased fibrosis whole forearm post MT.    PT Frequency 2x / week    PT Duration 8 weeks    PT Treatment/Interventions ADLs/Self Care Home Management;Therapeutic exercise;Patient/family education;Moist  Heat;Therapeutic activities;Manual techniques;Joint Manipulations;Passive range of motion;Cryotherapy;Manual lymph drainage;Compression bandaging;Taping    PT Next Visit Plan how did pt wrap at home/use glove? continue Lt UE MLD with wrapping and forearm foam,  will be measured by [facility] as able on Friday, AA/A/PROM to L shoulder ROM    Consulted and Agree with Plan of Care Patient           Patient will benefit from skilled therapeutic intervention in order to improve the following deficits and impairments:     Visit Diagnosis: Stiffness of left shoulder, not elsewhere classified  Abnormal posture  Malignant neoplasm of overlapping sites of left breast in female, estrogen receptor negative ([place])  Lymphedema     Problem List Patient Active Problem List   Diagnosis Date Noted  . Breast cancer metastasized to axillary lymph node, left ([place]) 03/09/2020  . Dehydration   . Non-intractable vomiting   . Intractable nausea and vomiting 09/21/2019  . Generalized weakness 09/21/2019  . AKI (acute kidney injury) ([place]) 09/21/2019  . Leukocytosis 09/21/2019  . Hypernatremia 09/21/2019  . Hyperchloremia 09/21/2019  . Thrombocytopenia ([place]) 09/21/2019  . Port-A-Cath in place 09/08/2019  . Family history of breast cancer   . Family history of throat cancer   . Family history of cervical cancer   . Malignant neoplasm of overlapping sites of left breast in female, estrogen receptor negative ([place]) 08/01/2019  . Family history of colon cancer mom 11/05/2018  . Encounter for therapeutic drug monitoring 11/21/2013  . Asthma with bronchitis 09/01/2013  . Chest pain 01/01/2013  . Aortic insufficiency 10/25/2011  . Chronic anticoagulation 09/29/2011  . Nocturnal dyspnea 09/29/2011  . VITAMIN D DEFICIENCY 08/06/2009  . DVT 01/26/2009  . KNEE PAIN, RIGHT 06/19/2008  . EDEMA 06/19/2008  . ANKLE PAIN, LEFT 02/13/2008  . HYPERKALEMIA 08/20/2007  . HYPOKALEMIA 08/20/2007  . HLD  (hyperlipidemia) 07/18/2007  . Cardiomyopathy, hypertensive ([place]) 07/18/2007  . Essential hypertension 06/26/2007  . Hypertensive heart disease with heart failure ([place]) 06/26/2007  . Congestive heart failure ([place]) 06/26/2007  . Allergic rhinitis, cause unspecified 06/26/2007  . ASTHMA 06/26/2007  . SYMPTOM, SYNCOPE AND COLLAPSE 06/26/2007  . HEADACHE 06/26/2007  . HEART MURMUR, HX OF 06/26/2007  . DVT, HX OF 06/26/2007    Stark Bray 06/01/2020, 12:00 PM  [place], [place], [address] Phone: [phone]   Fax:  [phone]  Name: [name] Foster MRN: [id number] Date of Birth: [birth year]

## 2020-06-02 ENCOUNTER — Other Ambulatory Visit: Payer: Self-pay

## 2020-06-02 ENCOUNTER — Ambulatory Visit
Admission: RE | Admit: 2020-06-02 | Discharge: 2020-06-02 | Disposition: A | Discharge status: Home / Self Care | Payer: Medicare Other | Source: Ambulatory Visit | Attending: Radiation Oncology | Admitting: Radiation Oncology

## 2020-06-02 DIAGNOSIS — Z171 Estrogen receptor negative status [ER-]: Secondary | ICD-10-CM | POA: Insufficient documentation

## 2020-06-02 DIAGNOSIS — Z510 Encounter for antineoplastic radiation therapy: Secondary | ICD-10-CM | POA: Insufficient documentation

## 2020-06-02 DIAGNOSIS — C50812 Malignant neoplasm of overlapping sites of left female breast: Secondary | ICD-10-CM | POA: Insufficient documentation

## 2020-06-03 ENCOUNTER — Ambulatory Visit
Admission: RE | Admit: 2020-06-03 | Discharge: 2020-06-03 | Disposition: A | Discharge status: Home / Self Care | Payer: Medicare Other | Source: Ambulatory Visit | Attending: Radiation Oncology | Admitting: Radiation Oncology

## 2020-06-03 ENCOUNTER — Other Ambulatory Visit: Payer: Self-pay

## 2020-06-03 DIAGNOSIS — Z171 Estrogen receptor negative status [ER-]: Secondary | ICD-10-CM | POA: Diagnosis not present

## 2020-06-03 DIAGNOSIS — C50812 Malignant neoplasm of overlapping sites of left female breast: Secondary | ICD-10-CM | POA: Diagnosis not present

## 2020-06-03 DIAGNOSIS — Z510 Encounter for antineoplastic radiation therapy: Secondary | ICD-10-CM | POA: Diagnosis not present

## 2020-06-04 ENCOUNTER — Other Ambulatory Visit: Payer: Self-pay

## 2020-06-04 ENCOUNTER — Ambulatory Visit
Admission: RE | Admit: 2020-06-04 | Discharge: 2020-06-04 | Disposition: A | Discharge status: Home / Self Care | Payer: Medicare Other | Source: Ambulatory Visit | Attending: Radiation Oncology | Admitting: Radiation Oncology

## 2020-06-04 ENCOUNTER — Ambulatory Visit: Payer: Medicare Other | Attending: Hematology and Oncology | Admitting: Rehabilitation

## 2020-06-04 ENCOUNTER — Encounter: Payer: Self-pay | Admitting: Rehabilitation

## 2020-06-04 DIAGNOSIS — Z510 Encounter for antineoplastic radiation therapy: Secondary | ICD-10-CM | POA: Diagnosis not present

## 2020-06-04 DIAGNOSIS — R293 Abnormal posture: Secondary | ICD-10-CM | POA: Insufficient documentation

## 2020-06-04 DIAGNOSIS — Z171 Estrogen receptor negative status [ER-]: Secondary | ICD-10-CM | POA: Insufficient documentation

## 2020-06-04 DIAGNOSIS — I890 Lymphedema, not elsewhere classified: Secondary | ICD-10-CM | POA: Insufficient documentation

## 2020-06-04 DIAGNOSIS — M25612 Stiffness of left shoulder, not elsewhere classified: Secondary | ICD-10-CM | POA: Diagnosis not present

## 2020-06-04 DIAGNOSIS — C50812 Malignant neoplasm of overlapping sites of left female breast: Secondary | ICD-10-CM | POA: Insufficient documentation

## 2020-06-04 NOTE — Therapy (Signed)
[place], [place], [address] Phone: [phone]   Fax:  [phone]  Physical Therapy Treatment  Patient Details  Name: [name] Foster MRN: [id number] Date of Birth: [birth year] Referring Provider (PT): Dr. Lindi Adie   Encounter Date: 06/04/2020   PT End of Session - 06/04/20 1544    Visit Number 13    Number of Visits 26    Date for PT Re-Evaluation 08/10/20    PT Start Time 1001   first 36minutes with Melissa for fitting   PT Stop Time 1050    PT Time Calculation (min) 49 min    Activity Tolerance Patient tolerated treatment well    Behavior During Therapy [facility] for tasks assessed/performed           Past Medical History:  Diagnosis Date  . Allergic rhinitis   . Anemia   . Aortic valve disorders   . Arthritis   . Asthma   . Cancer [facility])    Breast Cancer  . CHF (congestive heart failure) ([place])   . Coronary artery disease   . Family history of adverse reaction to anesthesia    difficulty waking mother up after surgery  . Family history of breast cancer   . Family history of cervical cancer   . Family history of colon cancer   . Family history of throat cancer   . Heart murmur   . Hyperlipidemia   . Hypertension   . Long term (current) use of anticoagulants   . Other primary cardiomyopathies   . Peripheral vascular disease (HCC)    right leg  . Personal history of colonic polyps   . Personal history of venous thrombosis and embolism     Past Surgical History:  Procedure Laterality Date  . ABDOMINAL HYSTERECTOMY    . BREAST BIOPSY Left 07/2019  . CARDIAC CATHETERIZATION    . COLON SURGERY     colonoscopy  . DILATION AND CURETTAGE OF UTERUS     several  . MASTECTOMY MODIFIED RADICAL Left 03/09/2020   Left Modified Radical Mastectomy (left mastectomy with axillary lymph node dissection)   . MASTECTOMY MODIFIED RADICAL Left 03/09/2020   Procedure: LEFT MODIFIED RADICAL MASTECTOMY;   Surgeon: Stark Klein, MD;  Location: [place];  Service: General;  Laterality: Left;  GEN AND PECTORAL BLOCK  . PORTACATH PLACEMENT Left 08/18/2019   Procedure: INSERTION PORT-A-CATH;  Surgeon: Stark Klein, MD;  Location: [place];  Service: General;  Laterality: Left;  . Rt knee arthoscopic    . TEE WITHOUT CARDIOVERSION N/A 02/21/2013   Procedure: TRANSESOPHAGEAL ECHOCARDIOGRAM (TEE);  Surgeon: Lelon Perla, MD;  Location: [facility] ENDOSCOPY;  Service: Cardiovascular;  Laterality: N/A;    There were no vitals filed for this visit.   Subjective Assessment - 06/04/20 1000    Subjective I was able to wrap myself at home pretty well.  But I can't wear it for radiation.  I have radiation after this at 1130    Pertinent History Patient was diagnosed on 07/24/2019 with left grade III invasive ductal carcinoma breast cancer. ER/PR negative and HER2 positive with a Ki67 of 70%. neoadjuvant chemotherapy completed.  Lt Mastectomy 03/09/20 with 19 axillary nodes removed and 2 inframammary nodes removed.  All negative.  Radiation consultation on 05/04/20.    Currently in Pain? No/denies                       Outpatient Rehab from 04/22/2020 in [facility]  Lymphedema Life Impact Scale Total Score 38.24 %            [facility] Adult PT Treatment/Exercise - 06/04/20 0001      Manual Therapy   Edema Management measured today for circaid velcro garment for night/day.  Still has not received new sleeve    Manual Lymphatic Drainage (MLD) In Supine: Short neck, superficial and deep abdominals, Lt inguinal and Rt axillary nodes, Lt axillo-inguinal and anterior inter-axillary (avoiding port at Lt superior chest) anastomosis, then Lt UE working from lateral upper arm to dorsum of hand then retracing all steps with focus on forearm both sides.  Some distal to proximal sweeping  for general lymphatic movement    Passive ROM to left shoulder in direction of flexion, D2 flexion,  abduction and ER with vcs for relaxation                       PT Long Term Goals - 05/25/20 1102      PT LONG TERM GOAL #1   Title Pt will improve her Lt shoulder active flexion and abduction to at least 140 to improve overhead reaching    Status On-going      PT LONG TERM GOAL #2   Title Pt will decrease circumferential measurements by at least 2cm globally proximal to the wrist to demonstrate improved Lt UE edema    Status On-going      PT LONG TERM GOAL #3   Title Pt will obtain appropriate compression garments for the Lt UE to manage lymphedema    Status Partially Met      PT LONG TERM GOAL #4   Title Pt will be ind with final HEP    Status On-going                 Plan - 06/04/20 1545    Clinical Impression Statement Pt has radiation post PT for no STM was performed with lotion.  Lt forearm much improved today with improved skin mobility and less pitting with pt self bandaging and able to use new gray foam.  Still limitations with Lt shoulder AROM/PROM and chest tightness    PT Frequency 2x / week    PT Duration 8 weeks    PT Treatment/Interventions ADLs/Self Care Home Management;Therapeutic exercise;Patient/family education;Moist Heat;Therapeutic activities;Manual techniques;Joint Manipulations;Passive range of motion;Cryotherapy;Manual lymph drainage;Compression bandaging;Taping    PT Next Visit Plan continue Lt UE MLD with wrapping and forearm foam or sleeve when radiation is close, AA/A/PROM to L shoulder ROM    Consulted and Agree with Plan of Care Patient           Patient will benefit from skilled therapeutic intervention in order to improve the following deficits and impairments:     Visit Diagnosis: Stiffness of left shoulder, not elsewhere classified  Abnormal posture  Malignant neoplasm of overlapping sites of left breast in female, estrogen receptor negative ([place])  Lymphedema     Problem List Patient Active Problem List    Diagnosis Date Noted  . Breast cancer metastasized to axillary lymph node, left ([place]) 03/09/2020  . Dehydration   . Non-intractable vomiting   . Intractable nausea and vomiting 09/21/2019  . Generalized weakness 09/21/2019  . AKI (acute kidney injury) ([place]) 09/21/2019  . Leukocytosis 09/21/2019  . Hypernatremia 09/21/2019  . Hyperchloremia 09/21/2019  . Thrombocytopenia ([place]) 09/21/2019  . Port-A-Cath in place 09/08/2019  . Family history of breast cancer   . Family history  of throat cancer   . Family history of cervical cancer   . Malignant neoplasm of overlapping sites of left breast in female, estrogen receptor negative ([place]) 08/01/2019  . Family history of colon cancer mom 11/05/2018  . Encounter for therapeutic drug monitoring 11/21/2013  . Asthma with bronchitis 09/01/2013  . Chest pain 01/01/2013  . Aortic insufficiency 10/25/2011  . Chronic anticoagulation 09/29/2011  . Nocturnal dyspnea 09/29/2011  . VITAMIN D DEFICIENCY 08/06/2009  . DVT 01/26/2009  . KNEE PAIN, RIGHT 06/19/2008  . EDEMA 06/19/2008  . ANKLE PAIN, LEFT 02/13/2008  . HYPERKALEMIA 08/20/2007  . HYPOKALEMIA 08/20/2007  . HLD (hyperlipidemia) 07/18/2007  . Cardiomyopathy, hypertensive ([place]) 07/18/2007  . Essential hypertension 06/26/2007  . Hypertensive heart disease with heart failure ([place]) 06/26/2007  . Congestive heart failure ([place]) 06/26/2007  . Allergic rhinitis, cause unspecified 06/26/2007  . ASTHMA 06/26/2007  . SYMPTOM, SYNCOPE AND COLLAPSE 06/26/2007  . HEADACHE 06/26/2007  . HEART MURMUR, HX OF 06/26/2007  . DVT, HX OF 06/26/2007    Stark Bray 06/04/2020, 3:48 PM  [place], [place], [address] Phone: [phone]   Fax:  [phone]  Name: [name] Foster MRN: [id number] Date of Birth: 08-10-1951

## 2020-06-08 ENCOUNTER — Ambulatory Visit
Admission: RE | Admit: 2020-06-08 | Discharge: 2020-06-08 | Disposition: A | Discharge status: Home / Self Care | Payer: Medicare Other | Source: Ambulatory Visit | Attending: Radiation Oncology | Admitting: Radiation Oncology

## 2020-06-08 ENCOUNTER — Other Ambulatory Visit: Payer: Self-pay

## 2020-06-08 DIAGNOSIS — Z510 Encounter for antineoplastic radiation therapy: Secondary | ICD-10-CM | POA: Diagnosis not present

## 2020-06-08 DIAGNOSIS — C50812 Malignant neoplasm of overlapping sites of left female breast: Secondary | ICD-10-CM | POA: Diagnosis not present

## 2020-06-08 DIAGNOSIS — Z171 Estrogen receptor negative status [ER-]: Secondary | ICD-10-CM | POA: Diagnosis not present

## 2020-06-09 ENCOUNTER — Ambulatory Visit
Admission: RE | Admit: 2020-06-09 | Discharge: 2020-06-09 | Disposition: A | Discharge status: Home / Self Care | Payer: Medicare Other | Source: Ambulatory Visit | Attending: Radiation Oncology | Admitting: Radiation Oncology

## 2020-06-09 ENCOUNTER — Other Ambulatory Visit: Payer: Self-pay

## 2020-06-09 DIAGNOSIS — C50812 Malignant neoplasm of overlapping sites of left female breast: Secondary | ICD-10-CM | POA: Diagnosis not present

## 2020-06-09 DIAGNOSIS — Z510 Encounter for antineoplastic radiation therapy: Secondary | ICD-10-CM | POA: Diagnosis not present

## 2020-06-09 DIAGNOSIS — Z171 Estrogen receptor negative status [ER-]: Secondary | ICD-10-CM | POA: Diagnosis not present

## 2020-06-10 ENCOUNTER — Other Ambulatory Visit: Payer: Self-pay

## 2020-06-10 ENCOUNTER — Ambulatory Visit
Admission: RE | Admit: 2020-06-10 | Discharge: 2020-06-10 | Disposition: A | Discharge status: Home / Self Care | Payer: Medicare Other | Source: Ambulatory Visit | Attending: Radiation Oncology | Admitting: Radiation Oncology

## 2020-06-10 DIAGNOSIS — Z510 Encounter for antineoplastic radiation therapy: Secondary | ICD-10-CM | POA: Diagnosis not present

## 2020-06-10 DIAGNOSIS — C50812 Malignant neoplasm of overlapping sites of left female breast: Secondary | ICD-10-CM | POA: Diagnosis not present

## 2020-06-10 DIAGNOSIS — Z171 Estrogen receptor negative status [ER-]: Secondary | ICD-10-CM | POA: Diagnosis not present

## 2020-06-11 ENCOUNTER — Ambulatory Visit
Admission: RE | Admit: 2020-06-11 | Discharge: 2020-06-11 | Disposition: A | Discharge status: Home / Self Care | Payer: Medicare Other | Source: Ambulatory Visit | Attending: Radiation Oncology | Admitting: Radiation Oncology

## 2020-06-11 ENCOUNTER — Other Ambulatory Visit: Payer: Self-pay

## 2020-06-11 DIAGNOSIS — Z510 Encounter for antineoplastic radiation therapy: Secondary | ICD-10-CM | POA: Diagnosis not present

## 2020-06-11 DIAGNOSIS — C50812 Malignant neoplasm of overlapping sites of left female breast: Secondary | ICD-10-CM | POA: Diagnosis not present

## 2020-06-11 DIAGNOSIS — Z171 Estrogen receptor negative status [ER-]: Secondary | ICD-10-CM | POA: Diagnosis not present

## 2020-06-14 ENCOUNTER — Other Ambulatory Visit: Payer: Self-pay

## 2020-06-14 ENCOUNTER — Ambulatory Visit
Admission: RE | Admit: 2020-06-14 | Discharge: 2020-06-14 | Disposition: A | Discharge status: Home / Self Care | Payer: Medicare Other | Source: Ambulatory Visit | Attending: Radiation Oncology | Admitting: Radiation Oncology

## 2020-06-14 DIAGNOSIS — C50812 Malignant neoplasm of overlapping sites of left female breast: Secondary | ICD-10-CM | POA: Diagnosis not present

## 2020-06-14 DIAGNOSIS — Z510 Encounter for antineoplastic radiation therapy: Secondary | ICD-10-CM | POA: Diagnosis not present

## 2020-06-14 DIAGNOSIS — Z171 Estrogen receptor negative status [ER-]: Secondary | ICD-10-CM | POA: Diagnosis not present

## 2020-06-15 ENCOUNTER — Other Ambulatory Visit: Payer: Self-pay

## 2020-06-15 ENCOUNTER — Ambulatory Visit
Admission: RE | Admit: 2020-06-15 | Discharge: 2020-06-15 | Disposition: A | Discharge status: Home / Self Care | Payer: Medicare Other | Source: Ambulatory Visit | Attending: Radiation Oncology | Admitting: Radiation Oncology

## 2020-06-15 ENCOUNTER — Encounter: Payer: Self-pay | Admitting: Rehabilitation

## 2020-06-15 ENCOUNTER — Ambulatory Visit: Payer: Medicare Other | Admitting: Rehabilitation

## 2020-06-15 DIAGNOSIS — I890 Lymphedema, not elsewhere classified: Secondary | ICD-10-CM | POA: Diagnosis not present

## 2020-06-15 DIAGNOSIS — Z510 Encounter for antineoplastic radiation therapy: Secondary | ICD-10-CM | POA: Diagnosis not present

## 2020-06-15 DIAGNOSIS — R293 Abnormal posture: Secondary | ICD-10-CM | POA: Diagnosis not present

## 2020-06-15 DIAGNOSIS — C50812 Malignant neoplasm of overlapping sites of left female breast: Secondary | ICD-10-CM | POA: Diagnosis not present

## 2020-06-15 DIAGNOSIS — M25612 Stiffness of left shoulder, not elsewhere classified: Secondary | ICD-10-CM | POA: Diagnosis not present

## 2020-06-15 DIAGNOSIS — Z171 Estrogen receptor negative status [ER-]: Secondary | ICD-10-CM | POA: Diagnosis not present

## 2020-06-15 NOTE — Therapy (Signed)
[place], [place], [address] Phone: [phone]   Fax:  [phone]  Physical Therapy Treatment  Patient Details  Name: [name] Foster MRN: [id number] Date of Birth: [birth year] Referring Provider (PT): Dr. Lindi Adie   Encounter Date: 06/15/2020   PT End of Session - 06/15/20 1355    Visit Number 14    Number of Visits 26    Date for PT Re-Evaluation 08/10/20    PT Start Time 1301    PT Stop Time 1352    PT Time Calculation (min) 51 min    Activity Tolerance Patient tolerated treatment well    Behavior During Therapy [facility] for tasks assessed/performed           Past Medical History:  Diagnosis Date  . Allergic rhinitis   . Anemia   . Aortic valve disorders   . Arthritis   . Asthma   . Cancer [facility])    Breast Cancer  . CHF (congestive heart failure) ([place])   . Coronary artery disease   . Family history of adverse reaction to anesthesia    difficulty waking mother up after surgery  . Family history of breast cancer   . Family history of cervical cancer   . Family history of colon cancer   . Family history of throat cancer   . Heart murmur   . Hyperlipidemia   . Hypertension   . Long term (current) use of anticoagulants   . Other primary cardiomyopathies   . Peripheral vascular disease (HCC)    right leg  . Personal history of colonic polyps   . Personal history of venous thrombosis and embolism     Past Surgical History:  Procedure Laterality Date  . ABDOMINAL HYSTERECTOMY    . BREAST BIOPSY Left 07/2019  . CARDIAC CATHETERIZATION    . COLON SURGERY     colonoscopy  . DILATION AND CURETTAGE OF UTERUS     several  . MASTECTOMY MODIFIED RADICAL Left 03/09/2020   Left Modified Radical Mastectomy (left mastectomy with axillary lymph node dissection)   . MASTECTOMY MODIFIED RADICAL Left 03/09/2020   Procedure: LEFT MODIFIED RADICAL MASTECTOMY;  Surgeon: [name] Klein, MD;  Location: [place];  Service: General;  Laterality: Left;  GEN AND PECTORAL BLOCK  . PORTACATH PLACEMENT Left 08/18/2019   Procedure: INSERTION PORT-A-CATH;  Surgeon: [name] Klein, MD;  Location: [place];  Service: General;  Laterality: Left;  . Rt knee arthoscopic    . TEE WITHOUT CARDIOVERSION N/A 02/21/2013   Procedure: TRANSESOPHAGEAL ECHOCARDIOGRAM (TEE);  Surgeon: Lelon Perla, MD;  Location: [facility] ENDOSCOPY;  Service: Cardiovascular;  Laterality: N/A;    There were no vitals filed for this visit.   Subjective Assessment - 06/15/20 1303    Subjective Nothing new. I am still able to bandage and the arm is feeling pretty good. I think they lost my other order.  It never processed so I may wait untl the velcro comes    Pertinent History Patient was diagnosed on 07/24/2019 with left grade III invasive ductal carcinoma breast cancer. ER/PR negative and HER2 positive with a Ki67 of 70%. neoadjuvant chemotherapy completed.  Lt Mastectomy 03/09/20 with 19 axillary nodes removed and 2 inframammary nodes removed.  All negative.  Radiation consultation on 05/04/20.    Patient Stated Goals swelling down, more mobility, get radiation started    Currently in Pain? No/denies  LYMPHEDEMA/ONCOLOGY QUESTIONNAIRE - 06/15/20 0001      Left Upper Extremity Lymphedema   15 cm Proximal to Olecranon Process 39 cm    10 cm Proximal to Olecranon Process 38 cm    Olecranon Process 31.5 cm    15 cm Proximal to Ulnar Styloid Process 30.5 cm    10 cm Proximal to Ulnar Styloid Process 28.4 cm    Just Proximal to Ulnar Styloid Process 19.5 cm    Across Hand at [employer] 20.9 cm    At [place] of 2nd Digit 6.2 cm                Outpatient Re[facility]  Lymphedema Life Impact Scale Total Score 38.24 %            [facility] Adult PT Treatment/Exercise - 06/15/20 0001      Manual Therapy   Edema Management measured UE    Manual Lymphatic  Drainage (MLD) In Supine: Short neck, superficial and deep abdominals, Lt inguinal and Rt axillary nodes, Lt axillo-inguinal and anterior inter-axillary (avoiding port at Lt superior chest) anastomosis, then Lt UE working from lateral upper arm to dorsum of hand then retracing all steps with focus on forearm both sides.  Some distal to proximal sweeping  for general lymphatic movement    Passive ROM to left shoulder in direction of flexion, D2 flexion, abduction and ER                        PT Long Term Goals - 05/25/20 1102      PT LONG TERM GOAL #1   Title Pt will improve her Lt shoulder active flexion and abduction to at least 140 to improve overhead reaching    Status On-going      PT LONG TERM GOAL #2   Title Pt will decrease circumferential measurements by at least 2cm globally proximal to the wrist to demonstrate improved Lt UE edema    Status On-going      PT LONG TERM GOAL #3   Title Pt will obtain appropriate compression garments for the Lt UE to manage lymphedema    Status Partially Met      PT LONG TERM GOAL #4   Title Pt will be ind with final HEP    Status On-going                 Plan - 06/15/20 1355    Clinical Impression Statement Pt has not received circaid yet or new sleeve which seems to have been stopped per pt.  Skin is still intact and not painful from radiation.  Measurements monstly unchanged from 2-3 weeks ago demonstrating no increase in swelling.  Pt continues to have flucuating edema in the forearm/fibrosis that decreases with increased self bandaging and MLD.    PT Frequency 2x / week    PT Duration 8 weeks    PT Treatment/Interventions ADLs/Self Care Home Management;Therapeutic exercise;Patient/family education;Moist Heat;Therapeutic activities;Manual techniques;Joint Manipulations;Passive range of motion;Cryotherapy;Manual lymph drainage;Compression bandaging;Taping    PT Next Visit Plan pt mentioned some pulling in the chest upon  leaving which we can check next visit, continue Lt UE MLD with pt wrapping with forearm foam or sleeve when radiation is close in time, AA/A/PROM to Lt shoulder ROM    Consulted and Agree with Plan of Care Patient           Patient will benefit from skilled therapeutic  intervention in order to improve the following deficits and impairments:     Visit Diagnosis: Stiffness of left shoulder, not elsewhere classified  Abnormal posture  Lymphedema  Malignant neoplasm of overlapping sites of left breast in female, estrogen receptor negative [facility])     Problem List Patient Active Problem List   Diagnosis Date Noted  . Breast cancer metastasized to axillary lymph node, left ([place]) 03/09/2020  . Dehydration   . Non-intractable vomiting   . Intractable nausea and vomiting 09/21/2019  . Generalized weakness 09/21/2019  . AKI (acute kidney injury) ([place]) 09/21/2019  . Leukocytosis 09/21/2019  . Hypernatremia 09/21/2019  . Hyperchloremia 09/21/2019  . Thrombocytopenia ([place]) 09/21/2019  . Port-A-Cath in place 09/08/2019  . Family history of breast cancer   . Family history of throat cancer   . Family history of cervical cancer   . Malignant neoplasm of overlapping sites of left breast in female, estrogen receptor negative ([place]) 08/01/2019  . Family history of colon cancer mom 11/05/2018  . Encounter for therapeutic drug monitoring 11/21/2013  . Asthma with bronchitis 09/01/2013  . Chest pain 01/01/2013  . Aortic insufficiency 10/25/2011  . Chronic anticoagulation 09/29/2011  . Nocturnal dyspnea 09/29/2011  . VITAMIN D DEFICIENCY 08/06/2009  . DVT 01/26/2009  . KNEE PAIN, RIGHT 06/19/2008  . EDEMA 06/19/2008  . ANKLE PAIN, LEFT 02/13/2008  . HYPERKALEMIA 08/20/2007  . HYPOKALEMIA 08/20/2007  . HLD (hyperlipidemia) 07/18/2007  . Cardiomyopathy, hypertensive ([place]) 07/18/2007  . Essential hypertension 06/26/2007  . Hypertensive heart disease with heart failure ([place]) 06/26/2007  .  Congestive heart failure ([place]) 06/26/2007  . Allergic rhinitis, cause unspecified 06/26/2007  . ASTHMA 06/26/2007  . SYMPTOM, SYNCOPE AND COLLAPSE 06/26/2007  . HEADACHE 06/26/2007  . HEART MURMUR, HX OF 06/26/2007  . DVT, HX OF 06/26/2007    [name] Foster 06/15/2020, 1:58 PM  [place], [place], [address] Phone: [phone]   Fax:  [phone]  Name: [name] Foster MRN: [id number] Date of Birth: 06/01/1951

## 2020-06-16 ENCOUNTER — Other Ambulatory Visit: Payer: Self-pay

## 2020-06-16 ENCOUNTER — Ambulatory Visit
Admission: RE | Admit: 2020-06-16 | Discharge: 2020-06-16 | Disposition: A | Discharge status: Home / Self Care | Payer: Medicare Other | Source: Ambulatory Visit | Attending: Radiation Oncology | Admitting: Radiation Oncology

## 2020-06-16 DIAGNOSIS — C50812 Malignant neoplasm of overlapping sites of left female breast: Secondary | ICD-10-CM | POA: Diagnosis not present

## 2020-06-16 DIAGNOSIS — Z510 Encounter for antineoplastic radiation therapy: Secondary | ICD-10-CM | POA: Diagnosis not present

## 2020-06-16 DIAGNOSIS — Z171 Estrogen receptor negative status [ER-]: Secondary | ICD-10-CM | POA: Diagnosis not present

## 2020-06-17 ENCOUNTER — Inpatient Hospital Stay: Payer: Medicare Other | Attending: Hematology and Oncology

## 2020-06-17 ENCOUNTER — Ambulatory Visit
Admission: RE | Admit: 2020-06-17 | Discharge: 2020-06-17 | Disposition: A | Discharge status: Home / Self Care | Payer: Medicare Other | Source: Ambulatory Visit | Attending: Radiation Oncology | Admitting: Radiation Oncology

## 2020-06-17 ENCOUNTER — Other Ambulatory Visit: Payer: Self-pay

## 2020-06-17 VITALS — BP 145/70 | HR 61 | Temp 99.2°F | Resp 16

## 2020-06-17 DIAGNOSIS — Z171 Estrogen receptor negative status [ER-]: Secondary | ICD-10-CM | POA: Diagnosis not present

## 2020-06-17 DIAGNOSIS — C50812 Malignant neoplasm of overlapping sites of left female breast: Secondary | ICD-10-CM

## 2020-06-17 DIAGNOSIS — Z5112 Encounter for antineoplastic immunotherapy: Secondary | ICD-10-CM | POA: Insufficient documentation

## 2020-06-17 DIAGNOSIS — Z79899 Other long term (current) drug therapy: Secondary | ICD-10-CM | POA: Diagnosis not present

## 2020-06-17 DIAGNOSIS — Z9012 Acquired absence of left breast and nipple: Secondary | ICD-10-CM | POA: Diagnosis not present

## 2020-06-17 DIAGNOSIS — Z510 Encounter for antineoplastic radiation therapy: Secondary | ICD-10-CM | POA: Diagnosis not present

## 2020-06-17 MED ORDER — SODIUM CHLORIDE 0.9 % IV SOLN
Freq: Once | INTRAVENOUS | Status: AC
  Filled 2020-06-17: qty 250

## 2020-06-17 MED ORDER — ACETAMINOPHEN 325 MG PO TABS
650.0000 mg | ORAL_TABLET | Freq: Once | ORAL | Status: AC
  Administered 2020-06-17: 650 mg via ORAL

## 2020-06-17 MED ORDER — TRASTUZUMAB-ANNS CHEMO 150 MG IV SOLR
6.0000 mg/kg | Freq: Once | INTRAVENOUS | Status: AC
  Administered 2020-06-17: 504 mg via INTRAVENOUS
  Filled 2020-06-17: qty 24

## 2020-06-17 MED ORDER — ACETAMINOPHEN 325 MG PO TABS
ORAL_TABLET | ORAL | Status: AC
  Filled 2020-06-17: qty 2

## 2020-06-17 MED ORDER — DIPHENHYDRAMINE HCL 25 MG PO CAPS
50.0000 mg | ORAL_CAPSULE | Freq: Once | ORAL | Status: AC
  Administered 2020-06-17: 50 mg via ORAL

## 2020-06-17 MED ORDER — SODIUM CHLORIDE 0.9% FLUSH
10.0000 mL | INTRAVENOUS | Status: DC | PRN
  Administered 2020-06-17: 10 mL
  Filled 2020-06-17: qty 10

## 2020-06-17 MED ORDER — HEPARIN SOD (PORK) LOCK FLUSH 100 UNIT/ML IV SOLN
500.0000 [IU] | Freq: Once | INTRAVENOUS | Status: AC | PRN
  Administered 2020-06-17: 500 [IU]
  Filled 2020-06-17: qty 5

## 2020-06-17 MED ORDER — DIPHENHYDRAMINE HCL 25 MG PO CAPS
ORAL_CAPSULE | ORAL | Status: AC
  Filled 2020-06-17: qty 2

## 2020-06-17 NOTE — Patient Instructions (Signed)
Cancer Center Discharge Instructions for Patients Receiving Chemotherapy  Today you received the following chemotherapy agents trastuzumab.  To help prevent nausea and vomiting after your treatment, we encourage you to take your nausea medication as directed.    If you develop nausea and vomiting that is not controlled by your nausea medication, call the clinic.   BELOW ARE SYMPTOMS THAT SHOULD BE REPORTED IMMEDIATELY:  *FEVER GREATER THAN 100.5 F  *CHILLS WITH OR WITHOUT FEVER  NAUSEA AND VOMITING THAT IS NOT CONTROLLED WITH YOUR NAUSEA MEDICATION  *UNUSUAL SHORTNESS OF BREATH  *UNUSUAL BRUISING OR BLEEDING  TENDERNESS IN MOUTH AND THROAT WITH OR WITHOUT PRESENCE OF ULCERS  *URINARY PROBLEMS  *BOWEL PROBLEMS  UNUSUAL RASH Items with * indicate a potential emergency and should be followed up as soon as possible.  Feel free to call the clinic should you have any questions or concerns. The clinic phone number is [phone].  Please show the CHEMO ALERT CARD at check-in to the Emergency Department and triage nurse.   

## 2020-06-18 ENCOUNTER — Ambulatory Visit: Payer: Medicare Other

## 2020-06-18 ENCOUNTER — Ambulatory Visit
Admission: RE | Admit: 2020-06-18 | Discharge: 2020-06-18 | Disposition: A | Discharge status: Home / Self Care | Payer: Medicare Other | Source: Ambulatory Visit | Attending: Radiation Oncology | Admitting: Radiation Oncology

## 2020-06-18 ENCOUNTER — Other Ambulatory Visit: Payer: Self-pay

## 2020-06-18 DIAGNOSIS — I890 Lymphedema, not elsewhere classified: Secondary | ICD-10-CM

## 2020-06-18 DIAGNOSIS — R293 Abnormal posture: Secondary | ICD-10-CM | POA: Diagnosis not present

## 2020-06-18 DIAGNOSIS — M25612 Stiffness of left shoulder, not elsewhere classified: Secondary | ICD-10-CM

## 2020-06-18 DIAGNOSIS — Z510 Encounter for antineoplastic radiation therapy: Secondary | ICD-10-CM | POA: Diagnosis not present

## 2020-06-18 DIAGNOSIS — C50912 Malignant neoplasm of unspecified site of left female breast: Secondary | ICD-10-CM | POA: Diagnosis not present

## 2020-06-18 DIAGNOSIS — Z171 Estrogen receptor negative status [ER-]: Secondary | ICD-10-CM | POA: Diagnosis not present

## 2020-06-18 DIAGNOSIS — C50812 Malignant neoplasm of overlapping sites of left female breast: Secondary | ICD-10-CM | POA: Diagnosis not present

## 2020-06-18 NOTE — Therapy (Signed)
[place], [place], [address] Phone: [phone]   Fax:  [phone]  Physical Therapy Treatment  Patient Details  Name: [name] Foster MRN: [id number] Date of Birth: 1951/01/06 Referring Provider (PT): Dr. Lindi Adie   Encounter Date: 06/18/2020   PT End of Session - 06/18/20 1107    Visit Number 15    Number of Visits 26    Date for PT Re-Evaluation 08/10/20    PT Start Time 1007    PT Stop Time 1101    PT Time Calculation (min) 54 min    Activity Tolerance Patient tolerated treatment well    Behavior During Therapy [facility] for tasks assessed/performed           Past Medical History:  Diagnosis Date  . Allergic rhinitis   . Anemia   . Aortic valve disorders   . Arthritis   . Asthma   . Cancer [facility])    Breast Cancer  . CHF (congestive heart failure) ([place])   . Coronary artery disease   . Family history of adverse reaction to anesthesia    difficulty waking mother up after surgery  . Family history of breast cancer   . Family history of cervical cancer   . Family history of colon cancer   . Family history of throat cancer   . Heart murmur   . Hyperlipidemia   . Hypertension   . Long term (current) use of anticoagulants   . Other primary cardiomyopathies   . Peripheral vascular disease (HCC)    right leg  . Personal history of colonic polyps   . Personal history of venous thrombosis and embolism     Past Surgical History:  Procedure Laterality Date  . ABDOMINAL HYSTERECTOMY    . BREAST BIOPSY Left 07/2019  . CARDIAC CATHETERIZATION    . COLON SURGERY     colonoscopy  . DILATION AND CURETTAGE OF UTERUS     several  . MASTECTOMY MODIFIED RADICAL Left 03/09/2020   Left Modified Radical Mastectomy (left mastectomy with axillary lymph node dissection)   . MASTECTOMY MODIFIED RADICAL Left 03/09/2020   Procedure: LEFT MODIFIED RADICAL MASTECTOMY;  Surgeon: Stark Klein, MD;  Location: [place];  Service: General;  Laterality: Left;  GEN AND PECTORAL BLOCK  . PORTACATH PLACEMENT Left 08/18/2019   Procedure: INSERTION PORT-A-CATH;  Surgeon: Stark Klein, MD;  Location: [place];  Service: General;  Laterality: Left;  . Rt knee arthoscopic    . TEE WITHOUT CARDIOVERSION N/A 02/21/2013   Procedure: TRANSESOPHAGEAL ECHOCARDIOGRAM (TEE);  Surgeon: Lelon Perla, MD;  Location: [facility] ENDOSCOPY;  Service: Cardiovascular;  Laterality: N/A;    There were no vitals filed for this visit.   Subjective Assessment - 06/18/20 1020    Subjective I got my new velcro compression sleeve when I got home Tuesday so wore it yesterday and really liked it. I think it fits good. Brought it so you could check it.    Pertinent History Patient was diagnosed on 07/24/2019 with left grade III invasive ductal carcinoma breast cancer. ER/PR negative and HER2 positive with a Ki67 of 70%. neoadjuvant chemotherapy completed.  Lt Mastectomy 03/09/20 with 19 axillary nodes removed and 2 inframammary nodes removed.  All negative.  Radiation consultation on 05/04/20.    Patient Stated Goals swelling down, more mobility, get radiation started    Currently in Pain? No/denies                 LYMPHEDEMA/ONCOLOGY  QUESTIONNAIRE - 06/18/20 0001      Left Upper Extremity Lymphedema   15 cm Proximal to Olecranon Process 37.1 cm    10 cm Proximal to Olecranon Process 36 cm    Olecranon Process 29.5 cm    15 cm Proximal to Ulnar Styloid Process 28.6 cm    10 cm Proximal to Ulnar Styloid Process 26.5 cm    Just Proximal to Ulnar Styloid Process 19.5 cm    Across Hand at [employer] 20.9 cm    At [place] of 2nd Digit 6.5 cm                [facility]patient [facility]  Lymphedema Life Impact Scale Total Score 38.24 %            [facility] Adult PT Treatment/Exercise - 06/18/20 0001      Manual Therapy   Manual Therapy Edema management;Myofascial  release;Manual Lymphatic Drainage (MLD);Passive ROM    Edema Management Pt brought in her new velcro compression garment (sleeve and hand piece) and assessed fit of this, which is very good and pt repotrs this feeling very comfortable. Also remeasured her circumference and good reductions noted since earlier in week just from wearing the velcro garment all day yesterday. Pt pleased with fit and progress.    Myofascial Release To Lt axilla during P/ROM    Manual Lymphatic Drainage (MLD) In Supine: Short neck, superficial and deep abdominals, Lt inguinal and Rt axillary nodes, Lt axillo-inguinal and anterior inter-axillary (avoiding port at Lt superior chest) anastomosis, then Lt UE working from lateral upper arm to dorsum of hand then retracing all steps with focus on forearm both sides.      Passive ROM to left shoulder in direction of flexion, D2 flexion, and abduction                       PT Long Term Goals - 05/25/20 1102      PT LONG TERM GOAL #1   Title Pt will improve her Lt shoulder active flexion and abduction to at least 140 to improve overhead reaching    Status On-going      PT LONG TERM GOAL #2   Title Pt will decrease circumferential measurements by at least 2cm globally proximal to the wrist to demonstrate improved Lt UE edema    Status On-going      PT LONG TERM GOAL #3   Title Pt will obtain appropriate compression garments for the Lt UE to manage lymphedema    Status Partially Met      PT LONG TERM GOAL #4   Title Pt will be ind with final HEP    Status On-going                 Plan - 06/18/20 1226    Clinical Impression Statement Pt brought new circaid in already wearing this. She had done excellent job of donning and was even using compression card to ascertain correct amount of compression at each strap. She repots it being very comfortable, even more so than her ExoStrong for now. So encouraged her to wear this more throughout duration of  remaining radiation treatments in upcoming 3 weeks as radiation can cause increase in lymphedema symptoms and Circaid will be best garment to counteract this. She verbalized understanding. Continued with P/ROM along with MFR as well as pt is still very tight and limited with her end motions. Encouraged her  to work stretching into her ADLs numerous times throughout day. She verbalized understanding all and left feeling very encouraged by overall progress and circumferential reductions noted today. And thus far skin still intact with no irritation noticeable, but instructed pt that if skin intergrity becomes an issue we may need to place her on hold until she finishes.    Personal Factors and Comorbidities Age;Finances;Comorbidity 1    Comorbidities ALND,    Examination-Activity Limitations Dressing;Sleep;Reach Overhead;Carry    Examination-Participation Restrictions Yard Work;Cleaning;Laundry;Community Activity    Stability/Clinical Decision Making Stable/Uncomplicated    Rehab Potential Excellent    PT Frequency 2x / week    PT Duration 8 weeks    PT Treatment/Interventions ADLs/Self Care Home Management;Therapeutic exercise;Patient/family education;Moist Heat;Therapeutic activities;Manual techniques;Joint Manipulations;Passive range of motion;Cryotherapy;Manual lymph drainage;Compression bandaging;Taping    PT Next Visit Plan continue Lt UE MLD with pt wearing her Circaid most of time except Exostrong when getting radiation as this is easier to get into position, AA/A/PROM to Lt shoulder ROM along with MFR if skin intergrity okay' may need to be placed on hold in coming weeks if skin intergrity becomes an issue    PT Home Exercise Plan supine flexion punch AAROM, standing dowel flexion, abduction, pendulums, remedial exercises, butterfly stretch; self MLD and wear compression garment accordingly    Consulted and Agree with Plan of Care Patient           Patient will benefit from skilled  therapeutic intervention in order to improve the following deficits and impairments:  Postural dysfunction, Decreased knowledge of precautions, Impaired UE functional use, Pain, Decreased range of motion, Decreased strength, Increased edema  Visit Diagnosis: Stiffness of left shoulder, not elsewhere classified  Abnormal posture  Lymphedema  Malignant neoplasm of overlapping sites of left breast in female, estrogen receptor negative ([place])     Problem List Patient Active Problem List   Diagnosis Date Noted  . Breast cancer metastasized to axillary lymph node, left ([place]) 03/09/2020  . Dehydration   . Non-intractable vomiting   . Intractable nausea and vomiting 09/21/2019  . Generalized weakness 09/21/2019  . AKI (acute kidney injury) ([place]) 09/21/2019  . Leukocytosis 09/21/2019  . Hypernatremia 09/21/2019  . Hyperchloremia 09/21/2019  . Thrombocytopenia ([place]) 09/21/2019  . Port-A-Cath in place 09/08/2019  . Family history of breast cancer   . Family history of throat cancer   . Family history of cervical cancer   . Malignant neoplasm of overlapping sites of left breast in female, estrogen receptor negative ([place]) 08/01/2019  . Family history of colon cancer mom 11/05/2018  . Encounter for therapeutic drug monitoring 11/21/2013  . Asthma with bronchitis 09/01/2013  . Chest pain 01/01/2013  . Aortic insufficiency 10/25/2011  . Chronic anticoagulation 09/29/2011  . Nocturnal dyspnea 09/29/2011  . VITAMIN D DEFICIENCY 08/06/2009  . DVT 01/26/2009  . KNEE PAIN, RIGHT 06/19/2008  . EDEMA 06/19/2008  . ANKLE PAIN, LEFT 02/13/2008  . HYPERKALEMIA 08/20/2007  . HYPOKALEMIA 08/20/2007  . HLD (hyperlipidemia) 07/18/2007  . Cardiomyopathy, hypertensive ([place]) 07/18/2007  . Essential hypertension 06/26/2007  . Hypertensive heart disease with heart failure ([place]) 06/26/2007  . Congestive heart failure ([place]) 06/26/2007  . Allergic rhinitis, cause unspecified 06/26/2007  . ASTHMA  06/26/2007  . SYMPTOM, SYNCOPE AND COLLAPSE 06/26/2007  . HEADACHE 06/26/2007  . HEART MURMUR, HX OF 06/26/2007  . DVT, HX OF 06/26/2007    Otelia Limes, PTA 06/18/2020, 12:31 PM  [place], [place],  [address] Phone: [phone]   Fax:  [phone]  Name: [name] Foster MRN: [id number] Date of Birth: [birth year]

## 2020-06-21 ENCOUNTER — Other Ambulatory Visit: Payer: Self-pay

## 2020-06-21 ENCOUNTER — Ambulatory Visit
Admission: RE | Admit: 2020-06-21 | Discharge: 2020-06-21 | Disposition: A | Discharge status: Home / Self Care | Payer: Medicare Other | Source: Ambulatory Visit | Attending: Radiation Oncology | Admitting: Radiation Oncology

## 2020-06-21 DIAGNOSIS — Z510 Encounter for antineoplastic radiation therapy: Secondary | ICD-10-CM | POA: Diagnosis not present

## 2020-06-21 DIAGNOSIS — Z171 Estrogen receptor negative status [ER-]: Secondary | ICD-10-CM | POA: Diagnosis not present

## 2020-06-21 DIAGNOSIS — C50812 Malignant neoplasm of overlapping sites of left female breast: Secondary | ICD-10-CM | POA: Diagnosis not present

## 2020-06-22 ENCOUNTER — Other Ambulatory Visit: Payer: Self-pay

## 2020-06-22 ENCOUNTER — Ambulatory Visit: Payer: Medicare Other | Admitting: Rehabilitation

## 2020-06-22 ENCOUNTER — Ambulatory Visit
Admission: RE | Admit: 2020-06-22 | Discharge: 2020-06-22 | Disposition: A | Discharge status: Home / Self Care | Payer: Medicare Other | Source: Ambulatory Visit | Attending: Radiation Oncology | Admitting: Radiation Oncology

## 2020-06-22 ENCOUNTER — Encounter: Payer: Self-pay | Admitting: Rehabilitation

## 2020-06-22 DIAGNOSIS — M25612 Stiffness of left shoulder, not elsewhere classified: Secondary | ICD-10-CM

## 2020-06-22 DIAGNOSIS — R293 Abnormal posture: Secondary | ICD-10-CM | POA: Diagnosis not present

## 2020-06-22 DIAGNOSIS — C50812 Malignant neoplasm of overlapping sites of left female breast: Secondary | ICD-10-CM | POA: Diagnosis not present

## 2020-06-22 DIAGNOSIS — I890 Lymphedema, not elsewhere classified: Secondary | ICD-10-CM

## 2020-06-22 DIAGNOSIS — Z510 Encounter for antineoplastic radiation therapy: Secondary | ICD-10-CM | POA: Diagnosis not present

## 2020-06-22 DIAGNOSIS — Z171 Estrogen receptor negative status [ER-]: Secondary | ICD-10-CM | POA: Diagnosis not present

## 2020-06-22 NOTE — Therapy (Signed)
[place], [place], [address] Phone: [phone]   Fax:  [phone]  Physical Therapy Treatment  Patient Details  Name: [name] Foster MRN: [id number] Date of Birth: [birth year] Referring Provider (PT): Dr. Lindi Adie   Encounter Date: 06/22/2020   PT End of Session - 06/22/20 1107    Visit Number 16    Number of Visits 26    Date for PT Re-Evaluation 08/10/20    PT Start Time 6301    PT Stop Time 1055    PT Time Calculation (min) 53 min    Activity Tolerance Patient tolerated treatment well    Behavior During Therapy [facility] for tasks assessed/performed           Past Medical History:  Diagnosis Date  . Allergic rhinitis   . Anemia   . Aortic valve disorders   . Arthritis   . Asthma   . Cancer [facility])    Breast Cancer  . CHF (congestive heart failure) ([place])   . Coronary artery disease   . Family history of adverse reaction to anesthesia    difficulty waking mother up after surgery  . Family history of breast cancer   . Family history of cervical cancer   . Family history of colon cancer   . Family history of throat cancer   . Heart murmur   . Hyperlipidemia   . Hypertension   . Long term (current) use of anticoagulants   . Other primary cardiomyopathies   . Peripheral vascular disease (HCC)    right leg  . Personal history of colonic polyps   . Personal history of venous thrombosis and embolism     Past Surgical History:  Procedure Laterality Date  . ABDOMINAL HYSTERECTOMY    . BREAST BIOPSY Left 07/2019  . CARDIAC CATHETERIZATION    . COLON SURGERY     colonoscopy  . DILATION AND CURETTAGE OF UTERUS     several  . MASTECTOMY MODIFIED RADICAL Left 03/09/2020   Left Modified Radical Mastectomy (left mastectomy with axillary lymph node dissection)   . MASTECTOMY MODIFIED RADICAL Left 03/09/2020   Procedure: LEFT MODIFIED RADICAL MASTECTOMY;  Surgeon: [name] Klein, MD;  Location: [place];  Service: General;  Laterality: Left;  GEN AND PECTORAL BLOCK  . PORTACATH PLACEMENT Left 08/18/2019   Procedure: INSERTION PORT-A-CATH;  Surgeon: [name] Klein, MD;  Location: [place];  Service: General;  Laterality: Left;  . Rt knee arthoscopic    . TEE WITHOUT CARDIOVERSION N/A 02/21/2013   Procedure: TRANSESOPHAGEAL ECHOCARDIOGRAM (TEE);  Surgeon: Lelon Perla, MD;  Location: [facility] ENDOSCOPY;  Service: Cardiovascular;  Laterality: N/A;    There were no vitals filed for this visit.   Subjective Assessment - 06/22/20 1003    Subjective I really like the velcro thing.  It is much lighter. Redness is starting now at the chest    Pertinent History Patient was diagnosed on 07/24/2019 with left grade III invasive ductal carcinoma breast cancer. ER/PR negative and HER2 positive with a Ki67 of 70%. neoadjuvant chemotherapy completed.  Lt Mastectomy 03/09/20 with 19 axillary nodes removed and 2 inframammary nodes removed.  All negative.  Radiation consultation on 05/04/20.    Currently in Pain? No/denies                       [facility]patient [facility]  Lymphedema Life Impact Scale Total Score 38.24 %  [place] Adult PT Treatment/Exercise - 06/22/20 0001      Manual Therapy   Manual Lymphatic Drainage (MLD) In Supine: Short neck, superficial and deep abdominals, Lt inguinal and Rt axillary nodes, Lt axillo-inguinal and anterior inter-axillary (avoiding port at Lt superior chest) anastomosis, then Lt UE working from lateral upper arm to dorsum of hand then retracing all steps with focus on forearm both sides.  Then in Right sidelying posterior interaxillary work and axillo inguinal work to lateral chest    Passive ROM to left shoulder in direction of flexion, D2 flexion, and abduction                       PT Long Term Goals - 05/25/20 1102      PT LONG TERM GOAL #1   Title Pt will improve her Lt  shoulder active flexion and abduction to at least 140 to improve overhead reaching    Status On-going      PT LONG TERM GOAL #2   Title Pt will decrease circumferential measurements by at least 2cm globally proximal to the wrist to demonstrate improved Lt UE edema    Status On-going      PT LONG TERM GOAL #3   Title Pt will obtain appropriate compression garments for the Lt UE to manage lymphedema    Status Partially Met      PT LONG TERM GOAL #4   Title Pt will be ind with final HEP    Status On-going                 Plan - 06/22/20 1123    Clinical Impression Statement Pt is continuing to like her Circaid Juxtafite with the left forearm feeling more decongested overall.  Pt is starting to have some more chest redness and discomfort but is still tolerating treatment well and would like to finihs out the month if possible.  Still with decreased PROM/AROM but lymphedema much more under control.    PT Frequency 2x / week    PT Duration 8 weeks    PT Treatment/Interventions ADLs/Self Care Home Management;Therapeutic exercise;Patient/family education;Moist Heat;Therapeutic activities;Manual techniques;Joint Manipulations;Passive range of motion;Cryotherapy;Manual lymph drainage;Compression bandaging;Taping    PT Next Visit Plan continue Lt UE MLD with pt wearing her Circaid most of time except Exostrong when getting radiation as this is easier to get into position, AA/A/PROM to Lt shoulder ROM along with MFR if skin intergrity okay' may need to be placed on hold in coming weeks if skin intergrity becomes an issue    Consulted and Agree with Plan of Care Patient           Patient will benefit from skilled therapeutic intervention in order to improve the following deficits and impairments:     Visit Diagnosis: Stiffness of left shoulder, not elsewhere classified  Abnormal posture  Lymphedema  Malignant neoplasm of overlapping sites of left breast in female, estrogen receptor  negative ([place])     Problem List Patient Active Problem List   Diagnosis Date Noted  . Breast cancer metastasized to axillary lymph node, left ([place]) 03/09/2020  . Dehydration   . Non-intractable vomiting   . Intractable nausea and vomiting 09/21/2019  . Generalized weakness 09/21/2019  . AKI (acute kidney injury) ([place]) 09/21/2019  . Leukocytosis 09/21/2019  . Hypernatremia 09/21/2019  . Hyperchloremia 09/21/2019  . Thrombocytopenia ([place]) 09/21/2019  . Port-A-Cath in place 09/08/2019  . Family history of breast cancer   . Family history of  throat cancer   . Family history of cervical cancer   . Malignant neoplasm of overlapping sites of left breast in female, estrogen receptor negative ([place]) 08/01/2019  . Family history of colon cancer mom 11/05/2018  . Encounter for therapeutic drug monitoring 11/21/2013  . Asthma with bronchitis 09/01/2013  . Chest pain 01/01/2013  . Aortic insufficiency 10/25/2011  . Chronic anticoagulation 09/29/2011  . Nocturnal dyspnea 09/29/2011  . VITAMIN D DEFICIENCY 08/06/2009  . DVT 01/26/2009  . KNEE PAIN, RIGHT 06/19/2008  . EDEMA 06/19/2008  . ANKLE PAIN, LEFT 02/13/2008  . HYPERKALEMIA 08/20/2007  . HYPOKALEMIA 08/20/2007  . HLD (hyperlipidemia) 07/18/2007  . Cardiomyopathy, hypertensive ([place]) 07/18/2007  . Essential hypertension 06/26/2007  . Hypertensive heart disease with heart failure ([place]) 06/26/2007  . Congestive heart failure ([place]) 06/26/2007  . Allergic rhinitis, cause unspecified 06/26/2007  . ASTHMA 06/26/2007  . SYMPTOM, SYNCOPE AND COLLAPSE 06/26/2007  . HEADACHE 06/26/2007  . HEART MURMUR, HX OF 06/26/2007  . DVT, HX OF 06/26/2007    [name] Foster 06/22/2020, 11:26 AM  [place], [place], [address] Phone: [phone]   Fax:  [phone]  Name: [name] Foster MRN: [id number] Date of Birth: December 23, 1950

## 2020-06-23 ENCOUNTER — Ambulatory Visit
Admission: RE | Admit: 2020-06-23 | Discharge: 2020-06-23 | Disposition: A | Discharge status: Home / Self Care | Payer: Medicare Other | Source: Ambulatory Visit | Attending: Radiation Oncology | Admitting: Radiation Oncology

## 2020-06-23 ENCOUNTER — Other Ambulatory Visit: Payer: Self-pay

## 2020-06-23 DIAGNOSIS — Z171 Estrogen receptor negative status [ER-]: Secondary | ICD-10-CM | POA: Diagnosis not present

## 2020-06-23 DIAGNOSIS — C50812 Malignant neoplasm of overlapping sites of left female breast: Secondary | ICD-10-CM | POA: Diagnosis not present

## 2020-06-23 DIAGNOSIS — Z510 Encounter for antineoplastic radiation therapy: Secondary | ICD-10-CM | POA: Diagnosis not present

## 2020-06-24 ENCOUNTER — Encounter: Payer: Self-pay | Admitting: Rehabilitation

## 2020-06-24 ENCOUNTER — Ambulatory Visit
Admission: RE | Admit: 2020-06-24 | Discharge: 2020-06-24 | Disposition: A | Discharge status: Home / Self Care | Payer: Medicare Other | Source: Ambulatory Visit | Attending: Radiation Oncology | Admitting: Radiation Oncology

## 2020-06-24 ENCOUNTER — Ambulatory Visit: Payer: Medicare Other | Admitting: Rehabilitation

## 2020-06-24 DIAGNOSIS — I890 Lymphedema, not elsewhere classified: Secondary | ICD-10-CM

## 2020-06-24 DIAGNOSIS — C50812 Malignant neoplasm of overlapping sites of left female breast: Secondary | ICD-10-CM | POA: Diagnosis not present

## 2020-06-24 DIAGNOSIS — R293 Abnormal posture: Secondary | ICD-10-CM

## 2020-06-24 DIAGNOSIS — Z171 Estrogen receptor negative status [ER-]: Secondary | ICD-10-CM | POA: Diagnosis not present

## 2020-06-24 DIAGNOSIS — M25612 Stiffness of left shoulder, not elsewhere classified: Secondary | ICD-10-CM

## 2020-06-24 DIAGNOSIS — Z510 Encounter for antineoplastic radiation therapy: Secondary | ICD-10-CM | POA: Diagnosis not present

## 2020-06-24 NOTE — Therapy (Signed)
[place], [place], [address] Phone: [phone]   Fax:  [phone]  Physical Therapy Treatment  Patient Details  Name: [name] Foster MRN: [id number] Date of Birth: 01/07/1951 Referring Provider (PT): Dr. Lindi Adie   Encounter Date: 06/24/2020   PT End of Session - 06/24/20 1046    Visit Number 17    Number of Visits 26    Date for PT Re-Evaluation 08/10/20    PT Start Time 1003    PT Stop Time 1051    PT Time Calculation (min) 48 min    Activity Tolerance Patient tolerated treatment well    Behavior During Therapy [facility] for tasks assessed/performed           Past Medical History:  Diagnosis Date  . Allergic rhinitis   . Anemia   . Aortic valve disorders   . Arthritis   . Asthma   . Cancer [facility])    Breast Cancer  . CHF (congestive heart failure) ([place])   . Coronary artery disease   . Family history of adverse reaction to anesthesia    difficulty waking mother up after surgery  . Family history of breast cancer   . Family history of cervical cancer   . Family history of colon cancer   . Family history of throat cancer   . Heart murmur   . Hyperlipidemia   . Hypertension   . Long term (current) use of anticoagulants   . Other primary cardiomyopathies   . Peripheral vascular disease (HCC)    right leg  . Personal history of colonic polyps   . Personal history of venous thrombosis and embolism     Past Surgical History:  Procedure Laterality Date  . ABDOMINAL HYSTERECTOMY    . BREAST BIOPSY Left 07/2019  . CARDIAC CATHETERIZATION    . COLON SURGERY     colonoscopy  . DILATION AND CURETTAGE OF UTERUS     several  . MASTECTOMY MODIFIED RADICAL Left 03/09/2020   Left Modified Radical Mastectomy (left mastectomy with axillary lymph node dissection)   . MASTECTOMY MODIFIED RADICAL Left 03/09/2020   Procedure: LEFT MODIFIED RADICAL MASTECTOMY;  Surgeon: Stark Klein, MD;  Location: [place];  Service: General;  Laterality: Left;  GEN AND PECTORAL BLOCK  . PORTACATH PLACEMENT Left 08/18/2019   Procedure: INSERTION PORT-A-CATH;  Surgeon: Stark Klein, MD;  Location: [place];  Service: General;  Laterality: Left;  . Rt knee arthoscopic    . TEE WITHOUT CARDIOVERSION N/A 02/21/2013   Procedure: TRANSESOPHAGEAL ECHOCARDIOGRAM (TEE);  Surgeon: Lelon Perla, MD;  Location: [facility] ENDOSCOPY;  Service: Cardiovascular;  Laterality: N/A;    There were no vitals filed for this visit.   Subjective Assessment - 06/24/20 1002    Subjective I got my glove for the circaid.  it fits well    Pertinent History Patient was diagnosed on 07/24/2019 with left grade III invasive ductal carcinoma breast cancer. ER/PR negative and HER2 positive with a Ki67 of 70%. neoadjuvant chemotherapy completed.  Lt Mastectomy 03/09/20 with 19 axillary nodes removed and 2 inframammary nodes removed.  All negative.  Radiation consultation on 05/04/20.    Patient Stated Goals swelling down, more mobility, get radiation started    Currently in Pain? No/denies                       [facility]patient [facility]  Lymphedema Life Impact Scale Total Score  38.24 %            [facility] Adult PT Treatment/Exercise - 06/24/20 0001      Manual Therapy   Manual Lymphatic Drainage (MLD) In Supine: Short neck, superficial and deep abdominals, Lt inguinal and Rt axillary nodes, Lt axillo-inguinal and anterior inter-axillary (avoiding port at Lt superior chest) anastomosis, then Lt UE working from lateral upper arm to dorsum of hand then retracing all steps with focus on forearm both sides.  Now avoiding radiation field due to redness    Passive ROM to left shoulder in direction of flexion, D2 flexion, and abduction limited end range pushing due to starting of some fragile looking skin in the axilla from radiation                       PT Long Term Goals  - 05/25/20 1102      PT LONG TERM GOAL #1   Title Pt will improve her Lt shoulder active flexion and abduction to at least 140 to improve overhead reaching    Status On-going      PT LONG TERM GOAL #2   Title Pt will decrease circumferential measurements by at least 2cm globally proximal to the wrist to demonstrate improved Lt UE edema    Status On-going      PT LONG TERM GOAL #3   Title Pt will obtain appropriate compression garments for the Lt UE to manage lymphedema    Status Partially Met      PT LONG TERM GOAL #4   Title Pt will be ind with final HEP    Status On-going                 Plan - 06/24/20 1046    Clinical Impression Statement Forearm with some mild fibrosis but much improved since starting with velcro garment.  Pt now has the glove and feels like this fits well.  Pt now with moderate redness and some desquamation appearing to start in the axilla so PROM and MLD gentle and focused on arm / forearm decongestion    PT Frequency 2x / week    PT Duration 8 weeks    PT Treatment/Interventions ADLs/Self Care Home Management;Therapeutic exercise;Patient/family education;Moist Heat;Therapeutic activities;Manual techniques;Joint Manipulations;Passive range of motion;Cryotherapy;Manual lymph drainage;Compression bandaging;Taping    PT Next Visit Plan continue Lt UE MLD with pt wearing her Circaid most of time except Exostrong when getting radiation as this is easier to get into position, AA/A/PROM to Lt shoulder ROM along with MFR if skin intergrity okay' may need to be placed on hold in coming weeks if skin intergrity becomes an issue    Consulted and Agree with Plan of Care Patient           Patient will benefit from skilled therapeutic intervention in order to improve the following deficits and impairments:     Visit Diagnosis: Stiffness of left shoulder, not elsewhere classified  Abnormal posture  Lymphedema  Malignant neoplasm of overlapping sites of left  breast in female, estrogen receptor negative ([place])     Problem List Patient Active Problem List   Diagnosis Date Noted  . Breast cancer metastasized to axillary lymph node, left ([place]) 03/09/2020  . Dehydration   . Non-intractable vomiting   . Intractable nausea and vomiting 09/21/2019  . Generalized weakness 09/21/2019  . AKI (acute kidney injury) ([place]) 09/21/2019  . Leukocytosis 09/21/2019  . Hypernatremia 09/21/2019  . Hyperchloremia 09/21/2019  . Thrombocytopenia ([place])  09/21/2019  . Port-A-Cath in place 09/08/2019  . Family history of breast cancer   . Family history of throat cancer   . Family history of cervical cancer   . Malignant neoplasm of overlapping sites of left breast in female, estrogen receptor negative ([place]) 08/01/2019  . Family history of colon cancer mom 11/05/2018  . Encounter for therapeutic drug monitoring 11/21/2013  . Asthma with bronchitis 09/01/2013  . Chest pain 01/01/2013  . Aortic insufficiency 10/25/2011  . Chronic anticoagulation 09/29/2011  . Nocturnal dyspnea 09/29/2011  . VITAMIN D DEFICIENCY 08/06/2009  . DVT 01/26/2009  . KNEE PAIN, RIGHT 06/19/2008  . EDEMA 06/19/2008  . ANKLE PAIN, LEFT 02/13/2008  . HYPERKALEMIA 08/20/2007  . HYPOKALEMIA 08/20/2007  . HLD (hyperlipidemia) 07/18/2007  . Cardiomyopathy, hypertensive ([place]) 07/18/2007  . Essential hypertension 06/26/2007  . Hypertensive heart disease with heart failure ([place]) 06/26/2007  . Congestive heart failure ([place]) 06/26/2007  . Allergic rhinitis, cause unspecified 06/26/2007  . ASTHMA 06/26/2007  . SYMPTOM, SYNCOPE AND COLLAPSE 06/26/2007  . HEADACHE 06/26/2007  . HEART MURMUR, HX OF 06/26/2007  . DVT, HX OF 06/26/2007    Stark Bray 06/24/2020, 10:55 AM  [place], [place], [address] Phone: [phone]   Fax:  [phone]  Name: [name] Foster MRN: [id number] Date of Birth: [birth year]

## 2020-06-25 ENCOUNTER — Ambulatory Visit: Payer: Medicare Other | Admitting: Radiation Oncology

## 2020-06-25 ENCOUNTER — Ambulatory Visit
Admission: RE | Admit: 2020-06-25 | Discharge: 2020-06-25 | Disposition: A | Discharge status: Home / Self Care | Payer: Medicare Other | Source: Ambulatory Visit | Attending: Radiation Oncology | Admitting: Radiation Oncology

## 2020-06-25 ENCOUNTER — Ambulatory Visit: Payer: Medicare Other

## 2020-06-25 ENCOUNTER — Other Ambulatory Visit: Payer: Self-pay

## 2020-06-25 DIAGNOSIS — C50812 Malignant neoplasm of overlapping sites of left female breast: Secondary | ICD-10-CM | POA: Diagnosis not present

## 2020-06-25 DIAGNOSIS — Z171 Estrogen receptor negative status [ER-]: Secondary | ICD-10-CM

## 2020-06-25 DIAGNOSIS — Z510 Encounter for antineoplastic radiation therapy: Secondary | ICD-10-CM | POA: Diagnosis not present

## 2020-06-25 MED ORDER — SONAFINE EX EMUL
1.0000 "application " | Freq: Once | CUTANEOUS | Status: AC
  Administered 2020-06-25: 1 via TOPICAL

## 2020-06-28 ENCOUNTER — Other Ambulatory Visit: Payer: Self-pay

## 2020-06-28 ENCOUNTER — Ambulatory Visit
Admission: RE | Admit: 2020-06-28 | Discharge: 2020-06-28 | Disposition: A | Discharge status: Home / Self Care | Payer: Medicare Other | Source: Ambulatory Visit | Attending: Radiation Oncology | Admitting: Radiation Oncology

## 2020-06-28 DIAGNOSIS — Z510 Encounter for antineoplastic radiation therapy: Secondary | ICD-10-CM | POA: Diagnosis not present

## 2020-06-28 DIAGNOSIS — Z171 Estrogen receptor negative status [ER-]: Secondary | ICD-10-CM | POA: Diagnosis not present

## 2020-06-28 DIAGNOSIS — C50812 Malignant neoplasm of overlapping sites of left female breast: Secondary | ICD-10-CM | POA: Diagnosis not present

## 2020-06-29 ENCOUNTER — Ambulatory Visit: Payer: Medicare Other | Admitting: Rehabilitation

## 2020-06-29 ENCOUNTER — Other Ambulatory Visit: Payer: Self-pay

## 2020-06-29 ENCOUNTER — Encounter: Payer: Self-pay | Admitting: Rehabilitation

## 2020-06-29 ENCOUNTER — Ambulatory Visit
Admission: RE | Admit: 2020-06-29 | Discharge: 2020-06-29 | Disposition: A | Discharge status: Home / Self Care | Payer: Medicare Other | Source: Ambulatory Visit | Attending: Radiation Oncology | Admitting: Radiation Oncology

## 2020-06-29 DIAGNOSIS — M25612 Stiffness of left shoulder, not elsewhere classified: Secondary | ICD-10-CM

## 2020-06-29 DIAGNOSIS — Z510 Encounter for antineoplastic radiation therapy: Secondary | ICD-10-CM | POA: Diagnosis not present

## 2020-06-29 DIAGNOSIS — C50812 Malignant neoplasm of overlapping sites of left female breast: Secondary | ICD-10-CM | POA: Diagnosis not present

## 2020-06-29 DIAGNOSIS — R293 Abnormal posture: Secondary | ICD-10-CM

## 2020-06-29 DIAGNOSIS — I890 Lymphedema, not elsewhere classified: Secondary | ICD-10-CM

## 2020-06-29 DIAGNOSIS — Z171 Estrogen receptor negative status [ER-]: Secondary | ICD-10-CM

## 2020-06-29 NOTE — Therapy (Signed)
[place], [place], [address] Phone: [phone]   Fax:  [phone]  Physical Therapy Treatment  Patient Details  Name: [name] Foster MRN: [id number] Date of Birth: [birth year] Referring Provider (PT): Dr. Lindi Adie   Encounter Date: 06/29/2020   PT End of Session - 06/29/20 1326    Visit Number 18    Number of Visits 26    Date for PT Re-Evaluation 08/10/20    Authorization Type [employer] medicare    PT Start Time 0954    PT Stop Time 1047    PT Time Calculation (min) 53 min    Activity Tolerance Patient tolerated treatment well    Behavior During Therapy [facility] for tasks assessed/performed           Past Medical History:  Diagnosis Date   Allergic rhinitis    Anemia    Aortic valve disorders    Arthritis    Asthma    Cancer ([place])    Breast Cancer   CHF (congestive heart failure) ([place])    Coronary artery disease    Family history of adverse reaction to anesthesia    difficulty waking mother up after surgery   Family history of breast cancer    Family history of cervical cancer    Family history of colon cancer    Family history of throat cancer    Heart murmur    Hyperlipidemia    Hypertension    Long term (current) use of anticoagulants    Other primary cardiomyopathies    Peripheral vascular disease ([place])    right leg   Personal history of colonic polyps    Personal history of venous thrombosis and embolism     Past Surgical History:  Procedure Laterality Date   ABDOMINAL HYSTERECTOMY     BREAST BIOPSY Left 07/2019   CARDIAC CATHETERIZATION     COLON SURGERY     colonoscopy   DILATION AND CURETTAGE OF UTERUS     several   MASTECTOMY MODIFIED RADICAL Left 03/09/2020   Left Modified Radical Mastectomy (left mastectomy with axillary lymph node dissection)    MASTECTOMY MODIFIED RADICAL Left 03/09/2020   Procedure: LEFT MODIFIED RADICAL MASTECTOMY;  Surgeon:  Stark Klein, MD;  Location: [place];  Service: General;  Laterality: Left;  GEN AND PECTORAL BLOCK   PORTACATH PLACEMENT Left 08/18/2019   Procedure: INSERTION PORT-A-CATH;  Surgeon: Stark Klein, MD;  Location: [place];  Service: General;  Laterality: Left;   Rt knee arthoscopic     TEE WITHOUT CARDIOVERSION N/A 02/21/2013   Procedure: TRANSESOPHAGEAL ECHOCARDIOGRAM (TEE);  Surgeon: Lelon Perla, MD;  Location: [facility] ENDOSCOPY;  Service: Cardiovascular;  Laterality: N/A;    There were no vitals filed for this visit.   Subjective Assessment - 06/29/20 0954    Subjective Nothing new    Pertinent History Patient was diagnosed on 07/24/2019 with left grade III invasive ductal carcinoma breast cancer. ER/PR negative and HER2 positive with a Ki67 of 70%. neoadjuvant chemotherapy completed.  Lt Mastectomy 03/09/20 with 19 axillary nodes removed and 2 inframammary nodes removed.  All negative.  Radiation consultation on 05/04/20.    Patient Stated Goals swelling down, more mobility, get radiation started    Currently in Pain? No/denies                       [facility]patient [facility]  Lymphedema Life Impact Scale Total Score 38.24 %  [place] Adult PT Treatment/Exercise - 06/29/20 0001      Manual Therapy   Manual therapy comments helped pt donn circaid glove and juxtalite    Edema Management Pt instructed to air dry axilla whenever possible due to wet desquamation starting.      Manual Lymphatic Drainage (MLD) In Supine: Short neck, superficial and deep abdominals, Lt inguinal and Rt axillary nodes, Lt axillo-inguinal and anterior inter-axillary (avoiding port at Lt superior chest and now radiation field) anastomosis, then Lt UE working from lateral upper arm to dorsum of hand then retracing all steps with focus on forearm both sides.  Now avoiding radiation field due to redness                       PT  Long Term Goals - 05/25/20 1102      PT LONG TERM GOAL #1   Title Pt will improve her Lt shoulder active flexion and abduction to at least 140 to improve overhead reaching    Status On-going      PT LONG TERM GOAL #2   Title Pt will decrease circumferential measurements by at least 2cm globally proximal to the wrist to demonstrate improved Lt UE edema    Status On-going      PT LONG TERM GOAL #3   Title Pt will obtain appropriate compression garments for the Lt UE to manage lymphedema    Status Partially Met      PT LONG TERM GOAL #4   Title Pt will be ind with final HEP    Status On-going                 Plan - 06/29/20 1326    Clinical Impression Statement Pt starting to have significant darkening of skin and wet desquamation in the axilla so PROM was held today with focus on forearm deconstestion during MLD.  Overall arm and forearm are much improved with use of velcro garment.    Stability/Clinical Decision Making Stable/Uncomplicated    Rehab Potential Excellent    PT Frequency 2x / week    PT Treatment/Interventions ADLs/Self Care Home Management;Therapeutic exercise;Patient/family education;Moist Heat;Therapeutic activities;Manual techniques;Joint Manipulations;Passive range of motion;Cryotherapy;Manual lymph drainage;Compression bandaging;Taping    PT Next Visit Plan continue Lt UE MLD with pt wearing her Circaid most of time except Exostrong when getting radiation as this is easier to get into position, AA/A/PROM to Lt shoulder ROM along with MFR if skin intergrity okay' may need to be placed on hold in coming weeks if skin intergrity becomes an issue    PT Home Exercise Plan supine flexion punch AAROM, standing dowel flexion, abduction, pendulums, remedial exercises, butterfly stretch; self MLD and wear compression garment accordingly    Consulted and Agree with Plan of Care Patient           Patient will benefit from skilled therapeutic intervention in order to  improve the following deficits and impairments:     Visit Diagnosis: Stiffness of left shoulder, not elsewhere classified  Abnormal posture  Lymphedema  Malignant neoplasm of overlapping sites of left breast in female, estrogen receptor negative [facility])     Problem List Patient Active Problem List   Diagnosis Date Noted   Breast cancer metastasized to axillary lymph node, left ([place]) 03/09/2020   Dehydration    Non-intractable vomiting    Intractable nausea and vomiting 09/21/2019   Generalized weakness 09/21/2019   AKI (acute kidney injury) ([place]) 09/21/2019   Leukocytosis 09/21/2019  Hypernatremia 09/21/2019   Hyperchloremia 09/21/2019   Thrombocytopenia ([place]) 09/21/2019   Port-A-Cath in place 09/08/2019   Family history of breast cancer    Family history of throat cancer    Family history of cervical cancer    Malignant neoplasm of overlapping sites of left breast in female, estrogen receptor negative ([place]) 08/01/2019   Family history of colon cancer mom 11/05/2018   Encounter for therapeutic drug monitoring 11/21/2013   Asthma with bronchitis 09/01/2013   Chest pain 01/01/2013   Aortic insufficiency 10/25/2011   Chronic anticoagulation 09/29/2011   Nocturnal dyspnea 09/29/2011   VITAMIN D DEFICIENCY 08/06/2009   DVT 01/26/2009   KNEE PAIN, RIGHT 06/19/2008   EDEMA 06/19/2008   ANKLE PAIN, LEFT 02/13/2008   HYPERKALEMIA 08/20/2007   HYPOKALEMIA 08/20/2007   HLD (hyperlipidemia) 07/18/2007   Cardiomyopathy, hypertensive ([place]) 07/18/2007   Essential hypertension 06/26/2007   Hypertensive heart disease with heart failure ([place]) 06/26/2007   Congestive heart failure ([place]) 06/26/2007   Allergic rhinitis, cause unspecified 06/26/2007   ASTHMA 06/26/2007   SYMPTOM, SYNCOPE AND COLLAPSE 06/26/2007   HEADACHE 06/26/2007   HEART MURMUR, HX OF 06/26/2007   DVT, HX OF 06/26/2007    Stark Bray 06/29/2020, 1:34 PM  [place], [place], [address] Phone: [phone]   Fax:  [phone]  Name: Tamecka Milham MRN: [id number] Date of Birth: 1951/05/30

## 2020-06-30 ENCOUNTER — Ambulatory Visit: Payer: Self-pay

## 2020-06-30 ENCOUNTER — Encounter: Payer: Self-pay | Admitting: Podiatry

## 2020-06-30 ENCOUNTER — Ambulatory Visit
Admission: RE | Admit: 2020-06-30 | Discharge: 2020-06-30 | Disposition: A | Discharge status: Home / Self Care | Payer: Medicare Other | Source: Ambulatory Visit | Attending: Radiation Oncology | Admitting: Radiation Oncology

## 2020-06-30 ENCOUNTER — Ambulatory Visit (INDEPENDENT_AMBULATORY_CARE_PROVIDER_SITE_OTHER): Payer: Medicare Other | Admitting: Podiatry

## 2020-06-30 DIAGNOSIS — G629 Polyneuropathy, unspecified: Secondary | ICD-10-CM

## 2020-06-30 DIAGNOSIS — D689 Coagulation defect, unspecified: Secondary | ICD-10-CM

## 2020-06-30 DIAGNOSIS — M79674 Pain in right toe(s): Secondary | ICD-10-CM | POA: Diagnosis not present

## 2020-06-30 DIAGNOSIS — M79675 Pain in left toe(s): Secondary | ICD-10-CM

## 2020-06-30 DIAGNOSIS — Z171 Estrogen receptor negative status [ER-]: Secondary | ICD-10-CM | POA: Diagnosis not present

## 2020-06-30 DIAGNOSIS — C50812 Malignant neoplasm of overlapping sites of left female breast: Secondary | ICD-10-CM | POA: Diagnosis not present

## 2020-06-30 DIAGNOSIS — B351 Tinea unguium: Secondary | ICD-10-CM

## 2020-06-30 DIAGNOSIS — Z510 Encounter for antineoplastic radiation therapy: Secondary | ICD-10-CM | POA: Diagnosis not present

## 2020-06-30 NOTE — Progress Notes (Signed)
Subjective:   Patient ID: [name] Foster, female   DOB: 69 y.o.   MRN: [id number]   HPI Patient presents stating she has damaged big toenails and all nails of both feet with history of chemo and states that she is on a blood thinner also and she cannot take care of these nails herself.  Patient has stopped smoking and likes to be active and is moderately obese which is also complicating factor   Review of Systems  All other systems reviewed and are negative.       Objective:  Physical Exam Vitals and nursing note reviewed.  Constitutional:      Appearance: She is well-developed.  Pulmonary:     Effort: Pulmonary effort is normal.  Musculoskeletal:        General: Normal range of motion.  Skin:    General: Skin is warm.  Neurological:     Mental Status: She is alert.     Neurovascular status found to be intact with mild diminishment of sharp dull vibratory consistent with chemo-induced neuropathy.  Patient is found to have moderate depression of the arch has thick yellow brittle nailbeds 1-5 both feet which are dystrophic painful and she is very scared to cut due to her blood thinner and the thickness and discomfort of the nailbeds     Assessment:  Neuropathy that is more related to the chemo application along with significant mycotic infected nails 1-5 both feet that are thick and on blood thinner     Plan:  H&P reviewed conditions and at this point I did debridement of her nailbeds with no iatrogenic bleeding and I discussed routine care along with daily inspections of her feet and what to do if any breakdown of tissue were to occur.  Patient will be seen back as needed

## 2020-07-01 ENCOUNTER — Ambulatory Visit: Payer: Medicare Other | Admitting: Rehabilitation

## 2020-07-01 ENCOUNTER — Ambulatory Visit
Admission: RE | Admit: 2020-07-01 | Discharge: 2020-07-01 | Disposition: A | Discharge status: Home / Self Care | Payer: Medicare Other | Source: Ambulatory Visit | Attending: Radiation Oncology | Admitting: Radiation Oncology

## 2020-07-01 ENCOUNTER — Encounter: Payer: Self-pay | Admitting: Rehabilitation

## 2020-07-01 ENCOUNTER — Other Ambulatory Visit: Payer: Self-pay

## 2020-07-01 DIAGNOSIS — M25612 Stiffness of left shoulder, not elsewhere classified: Secondary | ICD-10-CM | POA: Diagnosis not present

## 2020-07-01 DIAGNOSIS — R293 Abnormal posture: Secondary | ICD-10-CM | POA: Diagnosis not present

## 2020-07-01 DIAGNOSIS — I890 Lymphedema, not elsewhere classified: Secondary | ICD-10-CM

## 2020-07-01 DIAGNOSIS — Z510 Encounter for antineoplastic radiation therapy: Secondary | ICD-10-CM | POA: Diagnosis not present

## 2020-07-01 DIAGNOSIS — Z171 Estrogen receptor negative status [ER-]: Secondary | ICD-10-CM | POA: Diagnosis not present

## 2020-07-01 DIAGNOSIS — C50812 Malignant neoplasm of overlapping sites of left female breast: Secondary | ICD-10-CM | POA: Diagnosis not present

## 2020-07-01 NOTE — Therapy (Signed)
[place], [place], [address] Phone: [phone]   Fax:  [phone]  Physical Therapy Treatment  Patient Details  Name: [name] Foster MRN: [id number] Date of Birth: [birth year] Referring Provider (PT): Dr. Lindi Adie   Encounter Date: 07/01/2020   PT End of Session - 07/01/20 1106    Visit Number 19    Number of Visits 26    Date for PT Re-Evaluation 08/10/20    PT Start Time 1004    PT Stop Time 1049    PT Time Calculation (min) 45 min    Activity Tolerance Patient tolerated treatment well    Behavior During Therapy [facility] for tasks assessed/performed           Past Medical History:  Diagnosis Date  . Allergic rhinitis   . Anemia   . Aortic valve disorders   . Arthritis   . Asthma   . Cancer [facility])    Breast Cancer  . CHF (congestive heart failure) ([place])   . Coronary artery disease   . Family history of adverse reaction to anesthesia    difficulty waking mother up after surgery  . Family history of breast cancer   . Family history of cervical cancer   . Family history of colon cancer   . Family history of throat cancer   . Heart murmur   . Hyperlipidemia   . Hypertension   . Long term (current) use of anticoagulants   . Other primary cardiomyopathies   . Peripheral vascular disease (HCC)    right leg  . Personal history of colonic polyps   . Personal history of venous thrombosis and embolism     Past Surgical History:  Procedure Laterality Date  . ABDOMINAL HYSTERECTOMY    . BREAST BIOPSY Left 07/2019  . CARDIAC CATHETERIZATION    . COLON SURGERY     colonoscopy  . DILATION AND CURETTAGE OF UTERUS     several  . MASTECTOMY MODIFIED RADICAL Left 03/09/2020   Left Modified Radical Mastectomy (left mastectomy with axillary lymph node dissection)   . MASTECTOMY MODIFIED RADICAL Left 03/09/2020   Procedure: LEFT MODIFIED RADICAL MASTECTOMY;  Surgeon: Stark Klein, MD;  Location:  [place];  Service: General;  Laterality: Left;  GEN AND PECTORAL BLOCK  . PORTACATH PLACEMENT Left 08/18/2019   Procedure: INSERTION PORT-A-CATH;  Surgeon: Stark Klein, MD;  Location: [place];  Service: General;  Laterality: Left;  . Rt knee arthoscopic    . TEE WITHOUT CARDIOVERSION N/A 02/21/2013   Procedure: TRANSESOPHAGEAL ECHOCARDIOGRAM (TEE);  Surgeon: Lelon Perla, MD;  Location[facility] END[facility]COPY;  Service: Cardiovascular;  Laterality: N/A;    There were no vitals filed for this visit.   Subjective Assessment - 07/01/20 1006    Subjective I am just really sensitive and itchy now.    Pertinent History Patient was diagnosed on 07/24/2019 with left grade III invasive ductal carcinoma breast cancer. ER/PR negative and HER2 positive with a Ki67 of 70%. neoadjuvant chemotherapy completed.  Lt Mastectomy 03/09/20 with 19 axillary nodes removed and 2 inframammary nodes removed.  All negative.  Radiation consultation on 05/04/20.    Patient Stated Goals swelling down, more mobility, get radiation started    Currently in Pain? No/denies                 LYMPHEDEMA/ONCOLOGY QUESTIONNAIRE - 07/01/20 0001      Left Upper Extremity Lymphedema   At Axilla  42.3 cm    15  cm Proximal to Olecranon Process 39.3 cm    10 cm Proximal to Olecranon Process 36.9 cm    Olecranon Process 30 cm    15 cm Proximal to Ulnar Styloid Process 29 cm    10 cm Proximal to Ulnar Styloid Process 25.8 cm    Just Proximal to Ulnar Styloid Process 19.5 cm    Across Hand at [employer] 19.7 cm    At [place] of 2nd Digit 6.5 cm                Outpatient Re[facility]/22/[facility]  Lymphedema Life Impact Scale Total Score 38.24 %            [facility] Adult PT Treatment/Exercise - 07/01/20 0001      Self-Care   Self-Care Other Self-Care Comments    Other Self-Care Comments  Discussed lymphedema continuum consisting of replacing garments when to know when they are  worn out, etc and when to return after radiation for more possible visits.       Manual Therapy   Manual Lymphatic Drainage (MLD)  In Supine: Lt UE working from lateral upper arm to dorsum of hand then retracing all steps with focus on forearm both sides.  Now avoiding radiation field     Passive ROM held due to pain and sensitivity                        PT Long Term Goals - 07/01/20 1110      PT LONG TERM GOAL #1   Title Pt will improve her Lt shoulder active flexion and abduction to at least 140 to improve overhead reaching    Status On-going      PT LONG TERM GOAL #2   Title Pt will decrease circumferential measurements by at least 2cm globally proximal to the wrist to demonstrate improved Lt UE edema    Status Achieved      PT LONG TERM GOAL #3   Title Pt will obtain appropriate compression garments for the Lt UE to manage lymphedema    Status Achieved      PT LONG TERM GOAL #4   Title Pt will be ind with final HEP    Status On-going                 Plan - 07/01/20 1107    Clinical Impression Statement Focused on final measure for break due to radiation sensitivity and MLD of the UE arm only.  Now avoiding PROM and anastamosese due to radiation field.  pt will continue with garments until recheck needs for PROM/STM of the pectoralis    PT Frequency 2x / week    PT Duration 8 weeks    PT Treatment/Interventions ADLs/Self Care Home Management;Therapeutic exercise;Patient/family education;Moist Heat;Therapeutic activities;Manual techniques;Joint Manipulations;Passive range of motion;Cryotherapy;Manual lymph drainage;Compression bandaging;Taping    PT Next Visit Plan Reassess status    PT Home Exercise Plan supine flexion punch AAROM, standing dowel flexion, abduction, pendulums, remedial exercises, butterfly stretch; self MLD and wear compression garment accordingly    Consulted and Agree with Plan of Care Patient           Patient will benefit from  skilled therapeutic intervention in order to improve the following deficits and impairments:     Visit Diagnosis: Stiffness of left shoulder, not elsewhere classified  Abnormal posture  Lymphedema  Malignant neoplasm of overlapping sites of left breast in female, estrogen  receptor negative [facility])     Problem List Patient Active Problem List   Diagnosis Date Noted  . Breast cancer metastasized to axillary lymph node, left ([place]) 03/09/2020  . Coagulopathy ([place]) 12/05/2019  . Epistaxis, recurrent 12/05/2019  . Dehydration   . Non-intractable vomiting   . Intractable nausea and vomiting 09/21/2019  . Generalized weakness 09/21/2019  . AKI (acute kidney injury) ([place]) 09/21/2019  . Leukocytosis 09/21/2019  . Hypernatremia 09/21/2019  . Hyperchloremia 09/21/2019  . Thrombocytopenia ([place]) 09/21/2019  . Port-A-Cath in place 09/08/2019  . Family history of breast cancer   . Family history of throat cancer   . Family history of cervical cancer   . Malignant neoplasm of overlapping sites of left breast in female, estrogen receptor negative ([place]) 08/01/2019  . Family history of colon cancer mom 11/05/2018  . Encounter for therapeutic drug monitoring 11/21/2013  . Asthma with bronchitis 09/01/2013  . Chest pain 01/01/2013  . Aortic insufficiency 10/25/2011  . Chronic anticoagulation 09/29/2011  . Nocturnal dyspnea 09/29/2011  . VITAMIN D DEFICIENCY 08/06/2009  . DVT 01/26/2009  . KNEE PAIN, RIGHT 06/19/2008  . EDEMA 06/19/2008  . ANKLE PAIN, LEFT 02/13/2008  . HYPERKALEMIA 08/20/2007  . HYPOKALEMIA 08/20/2007  . HLD (hyperlipidemia) 07/18/2007  . Cardiomyopathy, hypertensive ([place]) 07/18/2007  . Essential hypertension 06/26/2007  . Hypertensive heart disease with heart failure ([place]) 06/26/2007  . Congestive heart failure ([place]) 06/26/2007  . Allergic rhinitis, cause unspecified 06/26/2007  . ASTHMA 06/26/2007  . SYMPTOM, SYNCOPE AND COLLAPSE 06/26/2007  . HEADACHE 06/26/2007   . HEART MURMUR, HX OF 06/26/2007  . DVT, HX OF 06/26/2007    Stark Bray 07/01/2020, 11:11 AM  [place], [place], [address] Phone: [phone]   Fax:  [phone]  Name: [name] Foster MRN: [id number] Date of Birth: 02-Apr-1951

## 2020-07-02 ENCOUNTER — Ambulatory Visit
Admission: RE | Admit: 2020-07-02 | Discharge: 2020-07-02 | Disposition: A | Discharge status: Home / Self Care | Payer: Medicare Other | Source: Ambulatory Visit | Attending: Radiation Oncology | Admitting: Radiation Oncology

## 2020-07-02 ENCOUNTER — Ambulatory Visit: Payer: Medicare Other | Admitting: Radiation Oncology

## 2020-07-02 DIAGNOSIS — C50812 Malignant neoplasm of overlapping sites of left female breast: Secondary | ICD-10-CM | POA: Diagnosis not present

## 2020-07-02 DIAGNOSIS — Z510 Encounter for antineoplastic radiation therapy: Secondary | ICD-10-CM | POA: Insufficient documentation

## 2020-07-02 DIAGNOSIS — Z171 Estrogen receptor negative status [ER-]: Secondary | ICD-10-CM | POA: Insufficient documentation

## 2020-07-05 ENCOUNTER — Ambulatory Visit
Admission: RE | Admit: 2020-07-05 | Discharge: 2020-07-05 | Disposition: A | Discharge status: Home / Self Care | Payer: Medicare Other | Source: Ambulatory Visit | Attending: Radiation Oncology | Admitting: Radiation Oncology

## 2020-07-05 ENCOUNTER — Encounter: Payer: Self-pay | Admitting: *Deleted

## 2020-07-05 ENCOUNTER — Other Ambulatory Visit: Payer: Self-pay

## 2020-07-05 DIAGNOSIS — Z510 Encounter for antineoplastic radiation therapy: Secondary | ICD-10-CM | POA: Diagnosis not present

## 2020-07-05 DIAGNOSIS — C50812 Malignant neoplasm of overlapping sites of left female breast: Secondary | ICD-10-CM | POA: Diagnosis not present

## 2020-07-05 DIAGNOSIS — Z171 Estrogen receptor negative status [ER-]: Secondary | ICD-10-CM | POA: Diagnosis not present

## 2020-07-06 ENCOUNTER — Ambulatory Visit
Admission: RE | Admit: 2020-07-06 | Discharge: 2020-07-06 | Disposition: A | Discharge status: Home / Self Care | Payer: Medicare Other | Source: Ambulatory Visit | Attending: Radiation Oncology | Admitting: Radiation Oncology

## 2020-07-06 DIAGNOSIS — C50812 Malignant neoplasm of overlapping sites of left female breast: Secondary | ICD-10-CM | POA: Diagnosis not present

## 2020-07-06 DIAGNOSIS — Z171 Estrogen receptor negative status [ER-]: Secondary | ICD-10-CM | POA: Diagnosis not present

## 2020-07-06 DIAGNOSIS — Z510 Encounter for antineoplastic radiation therapy: Secondary | ICD-10-CM | POA: Diagnosis not present

## 2020-07-07 ENCOUNTER — Other Ambulatory Visit: Payer: Self-pay | Admitting: *Deleted

## 2020-07-07 ENCOUNTER — Ambulatory Visit
Admission: RE | Admit: 2020-07-07 | Discharge: 2020-07-07 | Disposition: A | Discharge status: Home / Self Care | Payer: Medicare Other | Source: Ambulatory Visit | Attending: Radiation Oncology | Admitting: Radiation Oncology

## 2020-07-07 DIAGNOSIS — C50812 Malignant neoplasm of overlapping sites of left female breast: Secondary | ICD-10-CM | POA: Diagnosis not present

## 2020-07-07 DIAGNOSIS — Z510 Encounter for antineoplastic radiation therapy: Secondary | ICD-10-CM | POA: Diagnosis not present

## 2020-07-07 DIAGNOSIS — Z171 Estrogen receptor negative status [ER-]: Secondary | ICD-10-CM | POA: Diagnosis not present

## 2020-07-07 NOTE — Progress Notes (Signed)
Patient Care Team: Panosh, Standley Brooking, MD as PCP - General Stanford Breed, Denice Bors, MD as PCP - Cardiology (Cardiology) Stanford Breed Denice Bors, MD (Cardiology) Susa Day, MD (Orthopedic Surgery) Mauro Kaufmann, RN as Oncology Nurse Navigator Rockwell Germany, RN as Oncology Nurse Navigator  DIAGNOSIS:    ICD-10-CM   1. Malignant neoplasm of overlapping sites of left breast in female, estrogen receptor negative ([place])  C50.812    Z17.1     SUMMARY OF ONCOLOGIC HISTORY: Oncology History  Malignant neoplasm of overlapping sites of left breast in female, estrogen receptor negative ([place])  08/01/2019 Initial Diagnosis   Patient palpated left breast lump and skin thickening. Mammogram showed a 3.6cm mass at 3:30 position, a 0.9cm mass at 3:00 position, a 0.7cm mass at 2:00 position, a 0.5cm mass at 2:00 position, a 0.3cm mass at 1:00 position, and a 0.4cm mass at 1:00 position, with 4 abnormal left axillary lymph nodes. Biopsy showed IDC, grade 3, HER-2 + (3+), ER/PR -, Ki67 70%, in the breast and lymph nodes.    08/06/2019 Cancer Staging   Staging form: Breast, AJCC 8th Edition - Clinical: Stage IIIB (cT4, cN1, cM0, G3, ER-, PR-, HER2+) - Signed by Nicholas Lose, MD on 08/06/2019   08/19/2019 - 01/02/2020 Chemotherapy   The patient had dexamethasone (DECADRON) 4 MG tablet, 4 mg (100 % of original dose 4 mg), Oral, 2 times daily, 1 of 1 cycle, Start date: 08/06/2019, End date: 01/19/2020 Dose modification: 4 mg (original dose 4 mg, Cycle 0) palonosetron (ALOXI) injection 0.25 mg, 0.25 mg, Intravenous,  Once, 6 of 6 cycles Administration: 0.25 mg (08/19/2019), 0.25 mg (09/08/2019), 0.25 mg (12/12/2019), 0.25 mg (01/02/2020), 0.25 mg (10/31/2019), 0.25 mg (11/22/2019) pegfilgrastim-cbqv (UDENYCA) injection 6 mg, 6 mg, Subcutaneous, Once, 2 of 2 cycles Administration: 6 mg (08/21/2019), 6 mg (09/10/2019) CARBOplatin (PARAPLATIN) 480 mg in sodium chloride 0.9 % 250 mL chemo infusion, 480 mg (110.9 % of original  dose 429.6 mg), Intravenous,  Once, 2 of 2 cycles Dose modification:   (original dose 429.6 mg, Cycle 1) Administration: 480 mg (08/19/2019), 340 mg (09/08/2019) DOCEtaxel (TAXOTERE) 160 mg in sodium chloride 0.9 % 250 mL chemo infusion, 75 mg/m2 = 160 mg, Intravenous,  Once, 6 of 6 cycles Dose modification: 60 mg/m2 (original dose 75 mg/m2, Cycle 2, Reason: Dose not tolerated), 50 mg/m2 (original dose 75 mg/m2, Cycle 5, Reason: Dose not tolerated) Administration: 160 mg (08/19/2019), 130 mg (09/08/2019), 110 mg (12/12/2019), 110 mg (01/02/2020), 110 mg (10/31/2019), 110 mg (11/22/2019) pertuzumab (PERJETA) 420 mg in sodium chloride 0.9 % 250 mL chemo infusion, 420 mg (100 % of original dose 420 mg), Intravenous, Once, 2 of 2 cycles Dose modification: 420 mg (original dose 420 mg, Cycle 1, Reason: Provider Judgment) Administration: 420 mg (08/19/2019), 420 mg (09/08/2019) fosaprepitant (EMEND) 150 mg, dexamethasone (DECADRON) 12 mg in sodium chloride 0.9 % 145 mL IVPB, , Intravenous,  Once, 3 of 3 cycles Administration:  (08/19/2019),  (09/08/2019),  (10/31/2019) trastuzumab-anns (KANJINTI) 750 mg in sodium chloride 0.9 % 250 mL chemo infusion, 756 mg (100 % of original dose 8 mg/kg), Intravenous,  Once, 6 of 6 cycles Dose modification: 8 mg/kg (original dose 8 mg/kg, Cycle 1, Reason: Other (see comments), Comment: change to approved brand), 6 mg/kg (original dose 6 mg/kg, Cycle 2, Reason: Other (see comments), Comment: brand change per insurance) Administration: 750 mg (08/19/2019), 567 mg (09/08/2019), 567 mg (10/31/2019), 567 mg (11/22/2019), 567 mg (12/12/2019), 504 mg (01/02/2020)  for chemotherapy treatment.  09/21/2019 - 10/03/2019 Hospital Admission   Intractable nausea and vomiting   01/23/2020 -  Chemotherapy   The patient had trastuzumab-anns (KANJINTI) 504 mg in sodium chloride 0.9 % 250 mL chemo infusion, 6 mg/kg = 504 mg (100 % of original dose 6 mg/kg), Intravenous,  Once, 7 of 10 cycles Dose  modification: 6 mg/kg (original dose 6 mg/kg, Cycle 1, Reason: Other (see comments), Comment: biosimilar conversion; auth'd for Kanjinti) Administration: 504 mg (01/23/2020), 504 mg (02/13/2020), 504 mg (03/05/2020), 504 mg (04/16/2020), 504 mg (05/06/2020), 504 mg (05/27/2020), 504 mg (06/17/2020)  for chemotherapy treatment.    03/09/2020 Surgery   Left mastectomy and left axillary lymph node dissection ([place]): no residual carcinoma, 2 negative intramammary lymph nodes, and 19 negative left axillary lymph nodes.    05/27/2020 -  Radiation Therapy   Adjuvant left chest wall radiation     CHIEF COMPLIANT: Follow-up of Herceptin maintenance  INTERVAL HISTORY: [name] Foster is a 69 y.o. with above-mentioned history of left breast cancerwhocompletedneoadjuvant chemotherapy, underwent a left mastectomy, and is currently on maintenance Herceptin and undergoing radiation.She presents to the clinic today for treatment.   She is currently receiving radiation and has radiation dermatitis.  Occasional loose stools.  ALLERGIES:  is allergic to penicillins, tetanus toxoid, and aspirin.  MEDICATIONS:  Current Outpatient Medications  Medication Sig Dispense Refill  . Albuterol Sulfate (PROAIR RESPICLICK) 196 (90 Base) MCG/ACT AEPB Inhale 2 puffs into the lungs every 6 (six) hours as needed. (Patient taking differently: Inhale 2 puffs into the lungs every 6 (six) hours as needed (SOB / wheezing). ) 2 each 1  . amLODipine (NORVASC) 10 MG tablet TAKE 1 TABLET BY MOUTH  DAILY (Patient taking differently: Take 10 mg by mouth daily. ) 90 tablet 3  . atorvastatin (LIPITOR) 20 MG tablet TAKE 1 TABLET BY MOUTH  DAILY 90 tablet 1  . Carboxymethylcellul-Glycerin (CLEAR EYES FOR DRY EYES) 1-0.25 % SOLN Place 1 drop into both eyes 2 (two) times daily as needed (dry eyes).    . Cholecalciferol (VITAMIN D) 50 MCG (2000 UT) tablet Take 2,000 Units by mouth daily.    [place] Kitchen enoxaparin (LOVENOX) 80 MG/0.8ML injection Inject  0.8 mLs (80 mg total) into the skin daily for 11 days. To begin on 03/04/2020 8.8 mL 0  . fexofenadine (ALLEGRA) 180 MG tablet Take 180 mg by mouth daily as needed for allergies.     . fluticasone (FLONASE) 50 MCG/ACT nasal spray USE 2 SPRAYS IN EACH NOSTRIL DAILY. PT NEEDS TO SCHEDULE A FOLLOW UP APPT BEFORE NEXT REFILL. (Patient taking differently: Place 2 sprays into both nostrils daily. ) 16 g 0  . furosemide (LASIX) 40 MG tablet Take 40 mg by mouth daily.    . hydrALAZINE (APRESOLINE) 50 MG tablet Take 0.5 tablets (25 mg total) by mouth 3 (three) times daily. 1 tablet 0  . lidocaine-prilocaine (EMLA) cream Apply 1 application topically as directed. To affected area once    . methocarbamol (ROBAXIN) 500 MG tablet Take 1 tablet (500 mg total) by mouth every 6 (six) hours as needed for muscle spasms. 20 tablet 1  . metoprolol tartrate (LOPRESSOR) 50 MG tablet TAKE 1 TABLET BY MOUTH  TWICE DAILY 180 tablet 1  . ondansetron (ZOFRAN-ODT) 4 MG disintegrating tablet Take 1 tablet (4 mg total) by mouth every 6 (six) hours as needed for nausea. 20 tablet 0  . potassium chloride SA (KLOR-CON) 20 MEQ tablet Take 20 mEq by mouth daily.    . sodium  chloride (OCEAN) 0.65 % SOLN nasal spray Place 1 spray into both nostrils as needed for congestion.    . traMADol (ULTRAM) 50 MG tablet Take 1 tablet (50 mg total) by mouth every 6 (six) hours as needed (mild pain). 20 tablet 0  . warfarin (COUMADIN) 5 MG tablet TAKE 1 TABLET BY MOUTH  DAILY OR AS DIRECTED BY  ANTICOAGULATION CLINIC 100 tablet 1   No current facility-administered medications for this visit.    PHYSICAL EXAMINATION: ECOG PERFORMANCE STATUS: 1 - Symptomatic but completely ambulatory  Vitals:   07/08/20 0947  BP: 140/65  Pulse: 70  Resp: 18  Temp: (!) 97.1 F (36.2 C)  SpO2: 96%   Filed Weights   07/08/20 0947  Weight: 196 lb 3.2 oz (89 kg)    BREAST: No palpable masses or nodules in either right or left breasts. No palpable axillary  supraclavicular or infraclavicular adenopathy no breast tenderness or nipple discharge. (exam performed in the presence of a chaperone)  LABORATORY DATA:  I have reviewed the data as listed CMP Latest Ref Rng & Units 05/27/2020 04/16/2020 03/11/2020  Glucose 70 - 99 mg/dL 86 98 067(P)  BUN 8 - 23 mg/dL 21 16 03(E)  Creatinine 0.44 - 1.00 mg/dL 0.35(C) 4.81(Y) 5.90(B)  Sodium 135 - 145 mmol/L 143 144 140  Potassium 3.5 - 5.1 mmol/L 3.7 3.8 4.1  Chloride 98 - 111 mmol/L 108 110 111  CO2 22 - 32 mmol/L 29 23 21(L)  Calcium 8.9 - 10.3 mg/dL 9.3 9.2 7.9(L)  Total Protein 6.5 - 8.1 g/dL 7.1 7.1 -  Total Bilirubin 0.3 - 1.2 mg/dL 0.3 0.3 -  Alkaline Phos 38 - 126 U/L 121 122 -  AST 15 - 41 U/L 19 24 -  ALT 0 - 44 U/L 16 26 -    Lab Results  Component Value Date   WBC 4.3 07/08/2020   HGB 10.8 (L) 07/08/2020   HCT 34.0 (L) 07/08/2020   MCV 88.1 07/08/2020   PLT 183 07/08/2020   NEUTROABS 3.0 07/08/2020    ASSESSMENT & PLAN:  Malignant neoplasm of overlapping sites of left breast in female, estrogen receptor negative (HCC) 07/31/2020:Patient palpated left breast lump and skin thickening. Mammogram showed a 3.6cm mass at 3:30 position, a 0.9cm mass at 3:00 position, a 0.7cm mass at 2:00 position, a 0.5cm mass at 2:00 position, a 0.3cm mass at 1:00 position, and a 0.4cm mass at 1:00 position, with 4 abnormal left axillary lymph nodes. Biopsy showed IDC, grade 3, HER-2 + (3+), ER/PR -, Ki67 70%, in the breast and lymph nodes.  Treatment plan 1. Neoadjuvant chemotherapy with TCH Perjeta 6 cycles completed 01/02/2020 followed by Herceptinmaintenance  2. Followed bymastectomy withaxillary lymph node dissection 3. Followed by adjuvant radiation therapyto be completed 07/12/2020  03/09/2020:Left mastectomy and left axillary lymph node dissection ([place]): no residual carcinoma, 2 negative intramammary lymph nodes, and 19 negative left axillary lymph  nodes. --------------------------------------------------------------------------------------------------------------------------------------------- Current treatment: Maintenance Herceptin Herceptin toxicity: Occasional loose stools  05/19/2020: EF 50 to 55%: Same as previous Left arm lymphedema: Patient is using Velcro'straps to keep the fluid down and it is working very well.  She is working with physical therapy. Currently undergoing radiation.  This will be completed 07/12/2020.  Radiation dermatitis is noted.  Return to clinic every 3 weeks for maintenance therapy and every 6 weeks for follow-up with me I sent a message to Dr. Donell Beers to remove the port after 08/19/2020.    No orders of the defined  types were placed in this encounter.  The patient has a good understanding of the overall plan. she agrees with it. she will call with any problems that may develop before the next visit here.  Total time spent: 30 mins including face to face time and time spent for planning, charting and coordination of care  Nicholas Lose, MD 07/08/2020  I, Cloyde Reams Dorshimer, am acting as scribe for Dr. Nicholas Lose.  I have reviewed the above documentation for accuracy and completeness, and I agree with the above.

## 2020-07-08 ENCOUNTER — Inpatient Hospital Stay: Payer: Medicare Other | Attending: Hematology and Oncology

## 2020-07-08 ENCOUNTER — Inpatient Hospital Stay: Payer: Medicare Other

## 2020-07-08 ENCOUNTER — Other Ambulatory Visit: Payer: Self-pay

## 2020-07-08 ENCOUNTER — Inpatient Hospital Stay (HOSPITAL_BASED_OUTPATIENT_CLINIC_OR_DEPARTMENT_OTHER): Payer: Medicare Other | Admitting: Hematology and Oncology

## 2020-07-08 ENCOUNTER — Ambulatory Visit
Admission: RE | Admit: 2020-07-08 | Discharge: 2020-07-08 | Disposition: A | Discharge status: Home / Self Care | Payer: Medicare Other | Source: Ambulatory Visit | Attending: Radiation Oncology | Admitting: Radiation Oncology

## 2020-07-08 DIAGNOSIS — Z9221 Personal history of antineoplastic chemotherapy: Secondary | ICD-10-CM | POA: Diagnosis not present

## 2020-07-08 DIAGNOSIS — Z171 Estrogen receptor negative status [ER-]: Secondary | ICD-10-CM

## 2020-07-08 DIAGNOSIS — Z9012 Acquired absence of left breast and nipple: Secondary | ICD-10-CM | POA: Diagnosis not present

## 2020-07-08 DIAGNOSIS — Z79899 Other long term (current) drug therapy: Secondary | ICD-10-CM | POA: Diagnosis not present

## 2020-07-08 DIAGNOSIS — Z923 Personal history of irradiation: Secondary | ICD-10-CM | POA: Insufficient documentation

## 2020-07-08 DIAGNOSIS — Z510 Encounter for antineoplastic radiation therapy: Secondary | ICD-10-CM | POA: Diagnosis not present

## 2020-07-08 DIAGNOSIS — Z1501 Genetic susceptibility to malignant neoplasm of breast: Secondary | ICD-10-CM | POA: Diagnosis not present

## 2020-07-08 DIAGNOSIS — Z7901 Long term (current) use of anticoagulants: Secondary | ICD-10-CM | POA: Diagnosis not present

## 2020-07-08 DIAGNOSIS — Z5112 Encounter for antineoplastic immunotherapy: Secondary | ICD-10-CM | POA: Diagnosis not present

## 2020-07-08 DIAGNOSIS — Z23 Encounter for immunization: Secondary | ICD-10-CM | POA: Diagnosis not present

## 2020-07-08 DIAGNOSIS — C50812 Malignant neoplasm of overlapping sites of left female breast: Secondary | ICD-10-CM | POA: Insufficient documentation

## 2020-07-08 LAB — CBC WITH DIFFERENTIAL (CANCER CENTER ONLY)
Abs Immature Granulocytes: 0.01 10*3/uL (ref 0.00–0.07)
Basophils Absolute: 0 10*3/uL (ref 0.0–0.1)
Basophils Relative: 1 %
Eosinophils Absolute: 0.1 10*3/uL (ref 0.0–0.5)
Eosinophils Relative: 2 %
HCT: 34 % — ABNORMAL LOW (ref 36.0–46.0)
Hemoglobin: 10.8 g/dL — ABNORMAL LOW (ref 12.0–15.0)
Immature Granulocytes: 0 %
Lymphocytes Relative: 19 %
Lymphs Abs: 0.8 10*3/uL (ref 0.7–4.0)
MCH: 28 pg (ref 26.0–34.0)
MCHC: 31.8 g/dL (ref 30.0–36.0)
MCV: 88.1 fL (ref 80.0–100.0)
Monocytes Absolute: 0.4 10*3/uL (ref 0.1–1.0)
Monocytes Relative: 9 %
Neutro Abs: 3 10*3/uL (ref 1.7–7.7)
Neutrophils Relative %: 69 %
Platelet Count: 183 10*3/uL (ref 150–400)
RBC: 3.86 MIL/uL — ABNORMAL LOW (ref 3.87–5.11)
RDW: 14.9 % (ref 11.5–15.5)
WBC Count: 4.3 10*3/uL (ref 4.0–10.5)
nRBC: 0 % (ref 0.0–0.2)

## 2020-07-08 LAB — CMP (CANCER CENTER ONLY)
ALT: 20 U/L (ref 0–44)
AST: 23 U/L (ref 15–41)
Albumin: 3.6 g/dL (ref 3.5–5.0)
Alkaline Phosphatase: 116 U/L (ref 38–126)
Anion gap: 6 (ref 5–15)
BUN: 15 mg/dL (ref 8–23)
CO2: 27 mmol/L (ref 22–32)
Calcium: 9.2 mg/dL (ref 8.9–10.3)
Chloride: 110 mmol/L (ref 98–111)
Creatinine: 1.7 mg/dL — ABNORMAL HIGH (ref 0.44–1.00)
GFR, Estimated: 30 mL/min — ABNORMAL LOW (ref >60)
Glucose, Bld: 82 mg/dL (ref 70–99)
Potassium: 3.8 mmol/L (ref 3.5–5.1)
Sodium: 143 mmol/L (ref 135–145)
Total Bilirubin: 0.4 mg/dL (ref 0.3–1.2)
Total Protein: 7.1 g/dL (ref 6.5–8.1)

## 2020-07-08 LAB — PROTIME-INR
INR: 2.1 — ABNORMAL HIGH (ref 0.8–1.2)
Prothrombin Time: 23.1 seconds — ABNORMAL HIGH (ref 11.4–15.2)

## 2020-07-08 MED ORDER — SODIUM CHLORIDE 0.9 % IV SOLN
Freq: Once | INTRAVENOUS | Status: AC
  Filled 2020-07-08: qty 250

## 2020-07-08 MED ORDER — HEPARIN SOD (PORK) LOCK FLUSH 100 UNIT/ML IV SOLN
500.0000 [IU] | Freq: Once | INTRAVENOUS | Status: AC | PRN
  Administered 2020-07-08: 500 [IU]
  Filled 2020-07-08: qty 5

## 2020-07-08 MED ORDER — ACETAMINOPHEN 325 MG PO TABS
650.0000 mg | ORAL_TABLET | Freq: Once | ORAL | Status: AC
  Administered 2020-07-08: 650 mg via ORAL

## 2020-07-08 MED ORDER — TRASTUZUMAB-ANNS CHEMO 150 MG IV SOLR
6.0000 mg/kg | Freq: Once | INTRAVENOUS | Status: AC
  Administered 2020-07-08: 504 mg via INTRAVENOUS
  Filled 2020-07-08: qty 24

## 2020-07-08 MED ORDER — ACETAMINOPHEN 325 MG PO TABS
ORAL_TABLET | ORAL | Status: AC
  Filled 2020-07-08: qty 2

## 2020-07-08 MED ORDER — DIPHENHYDRAMINE HCL 25 MG PO CAPS
50.0000 mg | ORAL_CAPSULE | Freq: Once | ORAL | Status: AC
  Administered 2020-07-08: 50 mg via ORAL

## 2020-07-08 MED ORDER — INFLUENZA VAC A&B SA ADJ QUAD 0.5 ML IM PRSY
PREFILLED_SYRINGE | INTRAMUSCULAR | Status: AC
  Filled 2020-07-08: qty 0.5

## 2020-07-08 MED ORDER — INFLUENZA VAC A&B SA ADJ QUAD 0.5 ML IM PRSY
0.5000 mL | PREFILLED_SYRINGE | Freq: Once | INTRAMUSCULAR | Status: AC
  Administered 2020-07-08: 0.5 mL via INTRAMUSCULAR

## 2020-07-08 MED ORDER — DIPHENHYDRAMINE HCL 25 MG PO CAPS
ORAL_CAPSULE | ORAL | Status: AC
  Filled 2020-07-08: qty 2

## 2020-07-08 MED ORDER — SODIUM CHLORIDE 0.9% FLUSH
10.0000 mL | INTRAVENOUS | Status: DC | PRN
  Administered 2020-07-08: 10 mL
  Filled 2020-07-08: qty 10

## 2020-07-08 NOTE — Assessment & Plan Note (Signed)
07/31/2020:Patient palpated left breast lump and skin thickening. Mammogram showed a 3.6cm mass at 3:30 position, a 0.9cm mass at 3:00 position, a 0.7cm mass at 2:00 position, a 0.5cm mass at 2:00 position, a 0.3cm mass at 1:00 position, and a 0.4cm mass at 1:00 position, with 4 abnormal left axillary lymph nodes. Biopsy showed IDC, grade 3, HER-2 + (3+), ER/PR -, Ki67 70%, in the breast and lymph nodes.  Treatment plan 1. Neoadjuvant chemotherapy with TCH Perjeta 6 cycles completed 01/02/2020 followed by Herceptinmaintenance  2. Followed bymastectomy withaxillary lymph node dissection 3. Followed by adjuvant radiation therapy  03/09/2020:Left mastectomy and left axillary lymph node dissection ([place]): no residual carcinoma, 2 negative intramammary lymph nodes, and 19 negative left axillary lymph nodes. --------------------------------------------------------------------------------------------------------------------------------------------- Current treatment: Maintenance Herceptin Herceptin toxicity: Occasional loose stools  05/19/2020: EF 50 to 55%: Same as previous Left arm lymphedema: Physical therapy Currently undergoing radiation.  Return to clinic every 3 weeks for maintenance therapy and every 6 weeks for follow-up with me

## 2020-07-08 NOTE — Patient Instructions (Signed)
[facility] Discharge Instructions for Patients Receiving Chemotherapy  Today you received the following chemotherapy agents trastuzumab.  To help prevent nausea and vomiting after your treatment, we encourage you to take your nausea medication as directed.    If you develop nausea and vomiting that is not controlled by your nausea medication, call the clinic.   BELOW ARE SYMPTOMS THAT SHOULD BE REPORTED IMMEDIATELY:  *FEVER GREATER THAN 100.5 F  *CHILLS WITH OR WITHOUT FEVER  NAUSEA AND VOMITING THAT IS NOT CONTROLLED WITH YOUR NAUSEA MEDICATION  *UNUSUAL SHORTNESS OF BREATH  *UNUSUAL BRUISING OR BLEEDING  TENDERNESS IN MOUTH AND THROAT WITH OR WITHOUT PRESENCE OF ULCERS  *URINARY PROBLEMS  *BOWEL PROBLEMS  UNUSUAL RASH Items with * indicate a potential emergency and should be followed up as soon as possible.  Feel free to call the clinic should you have any questions or concerns. The clinic phone number is [phone].  Please show the CHEMO ALERT CARD at check-in to the Emergency Department and triage nurse.   

## 2020-07-09 ENCOUNTER — Ambulatory Visit
Admission: RE | Admit: 2020-07-09 | Discharge: 2020-07-09 | Disposition: A | Discharge status: Home / Self Care | Payer: Medicare Other | Source: Ambulatory Visit | Attending: Radiation Oncology | Admitting: Radiation Oncology

## 2020-07-09 ENCOUNTER — Telehealth: Payer: Self-pay | Admitting: Hematology and Oncology

## 2020-07-09 DIAGNOSIS — Z171 Estrogen receptor negative status [ER-]: Secondary | ICD-10-CM | POA: Diagnosis not present

## 2020-07-09 DIAGNOSIS — Z510 Encounter for antineoplastic radiation therapy: Secondary | ICD-10-CM | POA: Diagnosis not present

## 2020-07-09 DIAGNOSIS — C50812 Malignant neoplasm of overlapping sites of left female breast: Secondary | ICD-10-CM | POA: Diagnosis not present

## 2020-07-09 NOTE — Telephone Encounter (Signed)
No 10/7 los, no changes made to pt schedule

## 2020-07-12 ENCOUNTER — Encounter: Payer: Self-pay | Admitting: *Deleted

## 2020-07-12 ENCOUNTER — Ambulatory Visit
Admission: RE | Admit: 2020-07-12 | Discharge: 2020-07-12 | Disposition: A | Discharge status: Home / Self Care | Payer: Medicare Other | Source: Ambulatory Visit | Attending: Radiation Oncology | Admitting: Radiation Oncology

## 2020-07-12 ENCOUNTER — Encounter: Payer: Self-pay | Admitting: Radiation Oncology

## 2020-07-12 DIAGNOSIS — C50812 Malignant neoplasm of overlapping sites of left female breast: Secondary | ICD-10-CM | POA: Diagnosis not present

## 2020-07-12 DIAGNOSIS — Z510 Encounter for antineoplastic radiation therapy: Secondary | ICD-10-CM | POA: Diagnosis not present

## 2020-07-12 DIAGNOSIS — Z171 Estrogen receptor negative status [ER-]: Secondary | ICD-10-CM | POA: Diagnosis not present

## 2020-07-13 ENCOUNTER — Telehealth: Payer: Self-pay | Admitting: Radiation Oncology

## 2020-07-13 NOTE — Telephone Encounter (Signed)
  Radiation Oncology         [phone]) [phone] ________________________________  Name: Quenna Doepke MRN: [id number]  Date of Service: 07/13/2020  DOB: 18-Apr-1951  Post Treatment Telephone Note  Diagnosis:  Stage IIIB, cT4N1M0 grade 3, HER2 amplifed invasive ductal carcinoma of the left breast.  Interval Since Last Radiation:  yesterday  05/26/20-07/12/20: The left chest wall and regional nodes were treated to 50.4 Gy in 28 fractions, followed by a 10 Gy boost in 5 fractions.   Narrative:  The patient was contacted today for routine follow-up. During treatment she did very well with radiotherapy but did develop moderate skin changes during treatment. The patient did not stay yesterday to be evaluated, so I called today to check on her.  Impression/Plan: 1. Stage IIIB, cT4N1M0 grade 3, HER2 amplifed invasive ductal carcinoma of the left breast. I called and asked the patient to let me know how she is doing so we can determine if she needs to be examined given her skin changes she alerted the staff to yesterday.     Carola Rhine, PAC

## 2020-07-15 ENCOUNTER — Other Ambulatory Visit: Payer: Self-pay | Admitting: General Surgery

## 2020-07-17 ENCOUNTER — Ambulatory Visit: Payer: Medicare Other | Attending: Internal Medicine

## 2020-07-17 DIAGNOSIS — Z23 Encounter for immunization: Secondary | ICD-10-CM

## 2020-07-17 NOTE — Progress Notes (Signed)
   Covid-19 Vaccination Clinic  Name:  [name] Foster    MRN: [id number] DOB: 11-01-1950  07/17/2020  [name] Foster was observed post Covid-19 immunization for 15 minutes without incident. She was provided with Vaccine Information Sheet and instruction to access the V-Safe system.   [name] Foster was instructed to call 911 with any severe reactions post vaccine: Marland Kitchen Difficulty breathing  . Swelling of face and throat  . A fast heartbeat  . A bad rash all over body  . Dizziness and weakness

## 2020-07-29 ENCOUNTER — Inpatient Hospital Stay: Payer: Medicare Other

## 2020-07-29 ENCOUNTER — Other Ambulatory Visit: Payer: Self-pay

## 2020-07-29 VITALS — BP 136/71 | HR 55 | Temp 97.7°F | Resp 18

## 2020-07-29 DIAGNOSIS — Z23 Encounter for immunization: Secondary | ICD-10-CM | POA: Diagnosis not present

## 2020-07-29 DIAGNOSIS — Z7901 Long term (current) use of anticoagulants: Secondary | ICD-10-CM | POA: Diagnosis not present

## 2020-07-29 DIAGNOSIS — C50812 Malignant neoplasm of overlapping sites of left female breast: Secondary | ICD-10-CM | POA: Diagnosis not present

## 2020-07-29 DIAGNOSIS — Z79899 Other long term (current) drug therapy: Secondary | ICD-10-CM | POA: Diagnosis not present

## 2020-07-29 DIAGNOSIS — Z171 Estrogen receptor negative status [ER-]: Secondary | ICD-10-CM | POA: Diagnosis not present

## 2020-07-29 DIAGNOSIS — Z9221 Personal history of antineoplastic chemotherapy: Secondary | ICD-10-CM | POA: Diagnosis not present

## 2020-07-29 DIAGNOSIS — Z923 Personal history of irradiation: Secondary | ICD-10-CM | POA: Diagnosis not present

## 2020-07-29 DIAGNOSIS — Z5112 Encounter for antineoplastic immunotherapy: Secondary | ICD-10-CM | POA: Diagnosis not present

## 2020-07-29 DIAGNOSIS — Z9012 Acquired absence of left breast and nipple: Secondary | ICD-10-CM | POA: Diagnosis not present

## 2020-07-29 MED ORDER — TRASTUZUMAB-ANNS CHEMO 150 MG IV SOLR
6.0000 mg/kg | Freq: Once | INTRAVENOUS | Status: AC
  Administered 2020-07-29: 504 mg via INTRAVENOUS
  Filled 2020-07-29: qty 24

## 2020-07-29 MED ORDER — DIPHENHYDRAMINE HCL 25 MG PO CAPS
ORAL_CAPSULE | ORAL | Status: AC
  Filled 2020-07-29: qty 2

## 2020-07-29 MED ORDER — DIPHENHYDRAMINE HCL 25 MG PO CAPS
50.0000 mg | ORAL_CAPSULE | Freq: Once | ORAL | Status: AC
  Administered 2020-07-29: 50 mg via ORAL

## 2020-07-29 MED ORDER — ACETAMINOPHEN 325 MG PO TABS
650.0000 mg | ORAL_TABLET | Freq: Once | ORAL | Status: AC
  Administered 2020-07-29: 650 mg via ORAL

## 2020-07-29 MED ORDER — SODIUM CHLORIDE 0.9% FLUSH
10.0000 mL | INTRAVENOUS | Status: DC | PRN
  Administered 2020-07-29: 10 mL
  Filled 2020-07-29: qty 10

## 2020-07-29 MED ORDER — ACETAMINOPHEN 325 MG PO TABS
ORAL_TABLET | ORAL | Status: AC
  Filled 2020-07-29: qty 2

## 2020-07-29 MED ORDER — SODIUM CHLORIDE 0.9 % IV SOLN
Freq: Once | INTRAVENOUS | Status: AC
  Filled 2020-07-29: qty 250

## 2020-07-29 MED ORDER — HEPARIN SOD (PORK) LOCK FLUSH 100 UNIT/ML IV SOLN
500.0000 [IU] | Freq: Once | INTRAVENOUS | Status: AC | PRN
  Administered 2020-07-29: 500 [IU]
  Filled 2020-07-29: qty 5

## 2020-07-29 NOTE — Patient Instructions (Signed)
[facility] Discharge Instructions for Patients Receiving Chemotherapy  Today you received the following chemotherapy agents: trastuzumab-anns  To help prevent nausea and vomiting after your treatment, we encourage you to take your nausea medication as directed.   If you develop nausea and vomiting that is not controlled by your nausea medication, call the clinic.   BELOW ARE SYMPTOMS THAT SHOULD BE REPORTED IMMEDIATELY:  *FEVER GREATER THAN 100.5 F  *CHILLS WITH OR WITHOUT FEVER  NAUSEA AND VOMITING THAT IS NOT CONTROLLED WITH YOUR NAUSEA MEDICATION  *UNUSUAL SHORTNESS OF BREATH  *UNUSUAL BRUISING OR BLEEDING  TENDERNESS IN MOUTH AND THROAT WITH OR WITHOUT PRESENCE OF ULCERS  *URINARY PROBLEMS  *BOWEL PROBLEMS  UNUSUAL RASH Items with * indicate a potential emergency and should be followed up as soon as possible.  Feel free to call the clinic should you have any questions or concerns. The clinic phone number is [phone].  Please show the CHEMO ALERT CARD at check-in to the Emergency Department and triage nurse.   

## 2020-08-03 ENCOUNTER — Encounter: Payer: Self-pay | Admitting: Rehabilitation

## 2020-08-03 ENCOUNTER — Other Ambulatory Visit: Payer: Self-pay

## 2020-08-03 ENCOUNTER — Ambulatory Visit: Payer: Medicare Other | Attending: Hematology and Oncology | Admitting: Rehabilitation

## 2020-08-03 DIAGNOSIS — C50812 Malignant neoplasm of overlapping sites of left female breast: Secondary | ICD-10-CM | POA: Insufficient documentation

## 2020-08-03 DIAGNOSIS — I890 Lymphedema, not elsewhere classified: Secondary | ICD-10-CM | POA: Diagnosis not present

## 2020-08-03 DIAGNOSIS — R293 Abnormal posture: Secondary | ICD-10-CM | POA: Insufficient documentation

## 2020-08-03 DIAGNOSIS — Z171 Estrogen receptor negative status [ER-]: Secondary | ICD-10-CM | POA: Diagnosis not present

## 2020-08-03 DIAGNOSIS — M25612 Stiffness of left shoulder, not elsewhere classified: Secondary | ICD-10-CM | POA: Diagnosis not present

## 2020-08-03 NOTE — Patient Instructions (Signed)
Access Code: [email or web address]: [email or web address]: 11/02/2021Prepared by: Marcene Brawn TevisExercises  Supine Shoulder Flexion Extension AAROM with Dowel - 1-2 x daily - 7 x weekly - 10 reps - 5 seconds hold  Supine Shoulder External Rotation with Dowel - 1-2 x daily - 7 x weekly - 10 reps - 5 sseconds hold  Supine Chest Stretch with Elbows Bent - 1-2 x daily - 7 x weekly - 1 sets - 3 reps - 30-60seconds hold  Supine Lower Trunk Rotation - 1-2 x daily - 7 x weekly - 1 sets - 4 reps - 20-30 seconds hold  To focus on now

## 2020-08-03 NOTE — Therapy (Signed)
[place], [place], [address] Phone: [phone]   Fax:  [phone]  Physical Therapy Treatment  Patient Details  Name: [name] Foster MRN: [id number] Date of Birth: January 26, 1951 Referring Provider (PT): Dr. Lindi Adie  Physical Therapy Progress Note  Dates of Reporting Period: 05/28/20 to 08/03/20     Encounter Date: 08/03/2020   PT End of Session - 08/03/20 1054    Visit Number 20    Number of Visits 32    Date for PT Re-Evaluation 10/26/20    PT Start Time 1000    PT Stop Time 1054    PT Time Calculation (min) 54 min    Activity Tolerance Patient tolerated treatment well    Behavior During Therapy [facility] for tasks assessed/performed           Past Medical History:  Diagnosis Date  . Allergic rhinitis   . Anemia   . Aortic valve disorders   . Arthritis   . Asthma   . Cancer [facility])    Breast Cancer  . CHF (congestive heart failure) ([place])   . Coronary artery disease   . Family history of adverse reaction to anesthesia    difficulty waking mother up after surgery  . Family history of breast cancer   . Family history of cervical cancer   . Family history of colon cancer   . Family history of throat cancer   . Heart murmur   . Hyperlipidemia   . Hypertension   . Long term (current) use of anticoagulants   . Other primary cardiomyopathies   . Peripheral vascular disease (HCC)    right leg  . Personal history of colonic polyps   . Personal history of venous thrombosis and embolism     Past Surgical History:  Procedure Laterality Date  . ABDOMINAL HYSTERECTOMY    . BREAST BIOPSY Left 07/2019  . CARDIAC CATHETERIZATION    . COLON SURGERY     colonoscopy  . DILATION AND CURETTAGE OF UTERUS     several  . MASTECTOMY MODIFIED RADICAL Left 03/09/2020   Left Modified Radical Mastectomy (left mastectomy with axillary lymph node dissection)   . MASTECTOMY MODIFIED RADICAL Left 03/09/2020     Procedure: LEFT MODIFIED RADICAL MASTECTOMY;  Surgeon: [name] Klein, MD;  Location: [facility];  Service: General;  Laterality: Left;  GEN AND PECTORAL BLOCK  . PORTACATH PLACEMENT Left 08/18/2019   Procedure: INSERTION PORT-A-CATH;  Surgeon: [name] Klein, MD;  Location: [facility];  Service: General;  Laterality: Left;  . Rt knee arthoscopic    . TEE WITHOUT CARDIOVERSION N/A 02/21/2013   Procedure: TRANSESOPHAGEAL ECHOCARDIOGRAM (TEE);  Surgeon: Lelon Perla, MD;  Location[facility] ENDOSCOPY;  Service: Cardiovascular;  Laterality: N/A;    There were no vitals filed for this visit.   Subjective Assessment - 08/03/20 0959    Subjective I am done with radiation and I think doing ok.    Pertinent History Patient was diagnosed on 07/24/2019 with left grade III invasive ductal carcinoma breast cancer. ER/PR negative and HER2 positive with a Ki67 of 70%. neoadjuvant chemotherapy completed.  Lt Mastectomy 03/09/20 with 19 axillary nodes removed and 2 inframammary nodes removed.  All negative.  Radiation consultation on 05/04/20.    Patient Stated Goals swelling down, more mobility, get radiation started    Currently in Pain? No/denies              [facility] PT Assessment - 08/03/20 0001  AROM   Left Shoulder Flexion 114 Degrees   pull   Left Shoulder ABduction 115 Degrees   with pull            LYMPHEDEMA/ONCOLOGY QUESTIONNAIRE - 08/03/20 0001      Left Upper Extremity Lymphedema   15 cm Proximal to Olecranon Process 40.2 cm    10 cm Proximal to Olecranon Process 36.8 cm    Olecranon Process 30.4 cm    15 cm Proximal to Ulnar Styloid Process 29.2 cm    10 cm Proximal to Ulnar Styloid Process 26.7 cm    Just Proximal to Ulnar Styloid Process 19.2 cm    Across Hand at [employer] 19.8 cm    At [place] of 2nd Digit 6.3 cm                Outpatient Re[facility] from [facility]/22/[facility]  Lymphedema Life Impact Scale Total Score 38.24 %             [facility] Adult PT Treatment/Exercise - 08/03/20 0001      Manual Therapy   Manual therapy comments pt pt donn juxtalite arm and hand piece    Manual Lymphatic Drainage (MLD)  In Supine: sternal nodes, Lt inguinal nodes, Lt axillary region and then Lt UE working from lateral upper arm to dorsum of hand then retracing all steps with focus on forearm both sides.      Passive ROM to the left shoulder into flexion, abduction, ER at various angles and D2 to tolerance                       PT Long Term Goals - 08/03/20 1058      PT LONG TERM GOAL #1   Title Pt will improve her Lt shoulder active flexion and abduction to at least 140 to improve overhead reaching    Status On-going      PT LONG TERM GOAL #2   Title Pt will decrease circumferential measurements by at least 2cm globally proximal to the wrist to demonstrate improved Lt UE edema    Status Achieved      PT LONG TERM GOAL #3   Title Pt will obtain appropriate compression garments for the Lt UE to manage lymphedema    Status Achieved      PT LONG TERM GOAL #4   Title Pt will be ind with final HEP    Status On-going                 Plan - 08/03/20 1056    Clinical Impression Statement Pt returns 3 weeks post radiation completion with healing skin on the left chest, some sight redness remaining, and the biggest complaint of continued tightness in the left upper quadrant especially pectoralis and latissimus.  Pt demonstrates no change in circumferential measurements and has been inde with self care for her lymphedema and also demonstrates improving AROM of the shoulder.  Pt will focus on updated stretches for now and then return around 1 week post port removal for any STM/release as tolerated.    PT Frequency --   1-2x per week   PT Duration 12 weeks    PT Treatment/Interventions ADLs/Self Care Home Management;Therapeutic exercise;Patient/family education;Moist Heat;Therapeutic activities;Manual  techniques;Joint Manipulations;Passive range of motion;Cryotherapy;Manual lymph drainage;Compression bandaging;Taping    PT Next Visit Plan Reassess status    PT Home Exercise Plan focusing on HEP given 08/03/20    Consulted and Agree  with Plan of Care Patient           Patient will benefit from skilled therapeutic intervention in order to improve the following deficits and impairments:  Postural dysfunction, Decreased knowledge of precautions, Impaired UE functional use, Pain, Decreased range of motion, Decreased strength, Increased edema  Visit Diagnosis: Stiffness of left shoulder, not elsewhere classified  Abnormal posture  Lymphedema  Malignant neoplasm of overlapping sites of left breast in female, estrogen receptor negative [facility])     Problem List Patient Active Problem List   Diagnosis Date Noted  . Breast cancer metastasized to axillary lymph node, left ([place]) 03/09/2020  . Coagulopathy ([place]) 12/05/2019  . Epistaxis, recurrent 12/05/2019  . Dehydration   . Non-intractable vomiting   . Intractable nausea and vomiting 09/21/2019  . Generalized weakness 09/21/2019  . AKI (acute kidney injury) ([place]) 09/21/2019  . Leukocytosis 09/21/2019  . Hypernatremia 09/21/2019  . Hyperchloremia 09/21/2019  . Thrombocytopenia ([place]) 09/21/2019  . Port-A-Cath in place 09/08/2019  . Family history of breast cancer   . Family history of throat cancer   . Family history of cervical cancer   . Malignant neoplasm of overlapping sites of left breast in female, estrogen receptor negative ([place]) 08/01/2019  . Family history of colon cancer mom 11/05/2018  . Encounter for therapeutic drug monitoring 11/21/2013  . Asthma with bronchitis 09/01/2013  . Chest pain 01/01/2013  . Aortic insufficiency 10/25/2011  . Chronic anticoagulation 09/29/2011  . Nocturnal dyspnea 09/29/2011  . VITAMIN D DEFICIENCY 08/06/2009  . DVT 01/26/2009  . KNEE PAIN, RIGHT 06/19/2008  . EDEMA 06/19/2008  . ANKLE  PAIN, LEFT 02/13/2008  . HYPERKALEMIA 08/20/2007  . HYPOKALEMIA 08/20/2007  . HLD (hyperlipidemia) 07/18/2007  . Cardiomyopathy, hypertensive ([place]) 07/18/2007  . Essential hypertension 06/26/2007  . Hypertensive heart disease with heart failure ([place]) 06/26/2007  . Congestive heart failure ([place]) 06/26/2007  . Allergic rhinitis, cause unspecified 06/26/2007  . ASTHMA 06/26/2007  . SYMPTOM, SYNCOPE AND COLLAPSE 06/26/2007  . HEADACHE 06/26/2007  . HEART MURMUR, HX OF 06/26/2007  . DVT, HX OF 06/26/2007    [name] Foster 08/03/2020, 10:59 AM  [place], [place], [address] Phone: [phone]   Fax:  [phone]  Name: [name] Foster MRN: [id number] Date of Birth: 01-05-1951

## 2020-08-06 ENCOUNTER — Other Ambulatory Visit: Payer: Self-pay | Admitting: Cardiology

## 2020-08-06 ENCOUNTER — Other Ambulatory Visit: Payer: Self-pay | Admitting: Internal Medicine

## 2020-08-06 DIAGNOSIS — Z7901 Long term (current) use of anticoagulants: Secondary | ICD-10-CM

## 2020-08-12 ENCOUNTER — Encounter: Payer: Self-pay | Admitting: General Practice

## 2020-08-12 ENCOUNTER — Telehealth: Payer: Self-pay | Admitting: Cardiology

## 2020-08-12 NOTE — Telephone Encounter (Signed)
  Recall expunge letter sent 

## 2020-08-16 ENCOUNTER — Telehealth: Payer: Self-pay | Admitting: Radiation Oncology

## 2020-08-16 NOTE — Telephone Encounter (Signed)
  Radiation Oncology         [phone]) [phone] ________________________________  Name: [name] Foster MRN: [id number]  Date of Service: 08/16/2020  DOB: [birth year]  Post Treatment Telephone Note  Diagnosis:  Stage IIIB, PG9Q4K1 grade 3, HER2 amplifed invasive ductal carcinoma of the left breast.  Interval Since Last Radiation:  5  weeks   05/26/20-07/12/20: The left chest wall and regional nodes were treated to 50.4 Gy in 28 fractions followed by a 10 Gy boost in 5 fractions.   Narrative:  The patient was contacted today for routine follow-up. During treatment she did very well with radiotherapy and did have significant desquamation treated with sonafine.  Impression/Plan: 1. Stage IIIB, cT4N1M0 grade 3, HER2 amplifed invasive ductal carcinoma of the left breast. I was unable to reach the patient but on the voicemail, I discussed that we would be happy to continue to follow her as needed, but she will also continue to follow up with Dr. Lindi Adie in medical oncology. She was counseled on skin care as well as measures to avoid sun exposure to this area.  2. Survivorship. We discussed the importance of survivorship evaluation and encouraged her to attend her upcoming visit with that clinic.    Carola Rhine, PAC

## 2020-08-17 NOTE — Telephone Encounter (Signed)
Per Cindy's, RN, last anticoag note, pt's INR is being managed by Dr. Regis Bill.

## 2020-08-18 NOTE — Progress Notes (Signed)
Patient Care Team: Panosh, Standley Brooking, MD as PCP - General Stanford Breed, Denice Bors, MD as PCP - Cardiology (Cardiology) Stanford Breed Denice Bors, MD (Cardiology) Susa Day, MD (Orthopedic Surgery) Mauro Kaufmann, RN as Oncology Nurse Navigator Rockwell Germany, RN as Oncology Nurse Navigator  DIAGNOSIS:    ICD-10-CM   1. Malignant neoplasm of overlapping sites of left breast in female, estrogen receptor negative ([place])  C50.812    Z17.1     SUMMARY OF ONCOLOGIC HISTORY: Oncology History  Malignant neoplasm of overlapping sites of left breast in female, estrogen receptor negative ([place])  08/01/2019 Initial Diagnosis   Patient palpated left breast lump and skin thickening. Mammogram showed a 3.6cm mass at 3:30 position, a 0.9cm mass at 3:00 position, a 0.7cm mass at 2:00 position, a 0.5cm mass at 2:00 position, a 0.3cm mass at 1:00 position, and a 0.4cm mass at 1:00 position, with 4 abnormal left axillary lymph nodes. Biopsy showed IDC, grade 3, HER-2 + (3+), ER/PR -, Ki67 70%, in the breast and lymph nodes.    08/06/2019 Cancer Staging   Staging form: Breast, AJCC 8th Edition - Clinical: Stage IIIB (cT4, cN1, cM0, G3, ER-, PR-, HER2+) - Signed by Nicholas Lose, MD on 08/06/2019   08/19/2019 - 01/02/2020 Chemotherapy   The patient had dexamethasone (DECADRON) 4 MG tablet, 4 mg (100 % of original dose 4 mg), Oral, 2 times daily, 1 of 1 cycle, Start date: 08/06/2019, End date: 01/19/2020 Dose modification: 4 mg (original dose 4 mg, Cycle 0) palonosetron (ALOXI) injection 0.25 mg, 0.25 mg, Intravenous,  Once, 6 of 6 cycles Administration: 0.25 mg (08/19/2019), 0.25 mg (09/08/2019), 0.25 mg (12/12/2019), 0.25 mg (01/02/2020), 0.25 mg (10/31/2019), 0.25 mg (11/22/2019) pegfilgrastim-cbqv (UDENYCA) injection 6 mg, 6 mg, Subcutaneous, Once, 2 of 2 cycles Administration: 6 mg (08/21/2019), 6 mg (09/10/2019) CARBOplatin (PARAPLATIN) 480 mg in sodium chloride 0.9 % 250 mL chemo infusion, 480 mg (110.9 % of original  dose 429.6 mg), Intravenous,  Once, 2 of 2 cycles Dose modification:   (original dose 429.6 mg, Cycle 1) Administration: 480 mg (08/19/2019), 340 mg (09/08/2019) DOCEtaxel (TAXOTERE) 160 mg in sodium chloride 0.9 % 250 mL chemo infusion, 75 mg/m2 = 160 mg, Intravenous,  Once, 6 of 6 cycles Dose modification: 60 mg/m2 (original dose 75 mg/m2, Cycle 2, Reason: Dose not tolerated), 50 mg/m2 (original dose 75 mg/m2, Cycle 5, Reason: Dose not tolerated) Administration: 160 mg (08/19/2019), 130 mg (09/08/2019), 110 mg (12/12/2019), 110 mg (01/02/2020), 110 mg (10/31/2019), 110 mg (11/22/2019) pertuzumab (PERJETA) 420 mg in sodium chloride 0.9 % 250 mL chemo infusion, 420 mg (100 % of original dose 420 mg), Intravenous, Once, 2 of 2 cycles Dose modification: 420 mg (original dose 420 mg, Cycle 1, Reason: Provider Judgment) Administration: 420 mg (08/19/2019), 420 mg (09/08/2019) fosaprepitant (EMEND) 150 mg, dexamethasone (DECADRON) 12 mg in sodium chloride 0.9 % 145 mL IVPB, , Intravenous,  Once, 3 of 3 cycles Administration:  (08/19/2019),  (09/08/2019),  (10/31/2019) trastuzumab-anns (KANJINTI) 750 mg in sodium chloride 0.9 % 250 mL chemo infusion, 756 mg (100 % of original dose 8 mg/kg), Intravenous,  Once, 6 of 6 cycles Dose modification: 8 mg/kg (original dose 8 mg/kg, Cycle 1, Reason: Other (see comments), Comment: change to approved brand), 6 mg/kg (original dose 6 mg/kg, Cycle 2, Reason: Other (see comments), Comment: brand change per insurance) Administration: 750 mg (08/19/2019), 567 mg (09/08/2019), 567 mg (10/31/2019), 567 mg (11/22/2019), 567 mg (12/12/2019), 504 mg (01/02/2020)  for chemotherapy treatment.  09/21/2019 - 10/03/2019 Hospital Admission   Intractable nausea and vomiting   01/23/2020 -  Chemotherapy   The patient had trastuzumab-anns (KANJINTI) 504 mg in sodium chloride 0.9 % 250 mL chemo infusion, 6 mg/kg = 504 mg (100 % of original dose 6 mg/kg), Intravenous,  Once, 9 of 10 cycles Dose  modification: 6 mg/kg (original dose 6 mg/kg, Cycle 1, Reason: Other (see comments), Comment: biosimilar conversion; auth'd for Kanjinti) Administration: 504 mg (01/23/2020), 504 mg (02/13/2020), 504 mg (03/05/2020), 504 mg (04/16/2020), 504 mg (05/06/2020), 504 mg (05/27/2020), 504 mg (06/17/2020), 504 mg (07/08/2020), 504 mg (07/29/2020)  for chemotherapy treatment.    03/09/2020 Surgery   Left mastectomy and left axillary lymph node dissection ([place]): no residual carcinoma, 2 negative intramammary lymph nodes, and 19 negative left axillary lymph nodes.    05/27/2020 - 07/12/2020 Radiation Therapy   Adjuvant left chest wall radiation     CHIEF COMPLIANT: Follow-up ofHerceptin maintenance  INTERVAL HISTORY: [name] Foster is a 69 y.o. with above-mentioned history of left breast cancerwhocompletedneoadjuvant chemotherapy,underwent a left mastectomy, radiation, and is currently on maintenance Herceptin.She presents to the clinic todayfor treatment.  ALLERGIES:  is allergic to penicillins, tetanus toxoid, and aspirin.  MEDICATIONS:  Current Outpatient Medications  Medication Sig Dispense Refill  . Albuterol Sulfate (PROAIR RESPICLICK) 381 (90 Base) MCG/ACT AEPB Inhale 2 puffs into the lungs every 6 (six) hours as needed. (Patient taking differently: Inhale 2 puffs into the lungs every 6 (six) hours as needed (SOB / wheezing). ) 2 each 1  . amLODipine (NORVASC) 10 MG tablet TAKE 1 TABLET BY MOUTH  DAILY (Patient taking differently: Take 10 mg by mouth daily. ) 90 tablet 3  . atorvastatin (LIPITOR) 20 MG tablet TAKE 1 TABLET BY MOUTH  DAILY 90 tablet 3  . Carboxymethylcellul-Glycerin (CLEAR EYES FOR DRY EYES) 1-0.25 % SOLN Place 1 drop into both eyes 2 (two) times daily as needed (dry eyes).    . Cholecalciferol (VITAMIN D) 50 MCG (2000 UT) tablet Take 2,000 Units by mouth daily.    Marland Kitchen enoxaparin (LOVENOX) 80 MG/0.8ML injection Inject 0.8 mLs (80 mg total) into the skin daily for 11 days. To  begin on 03/04/2020 8.8 mL 0  . fexofenadine (ALLEGRA) 180 MG tablet Take 180 mg by mouth daily as needed for allergies.     . fluticasone (FLONASE) 50 MCG/ACT nasal spray USE 2 SPRAYS IN EACH NOSTRIL DAILY. PT NEEDS TO SCHEDULE A FOLLOW UP APPT BEFORE NEXT REFILL. (Patient taking differently: Place 2 sprays into both nostrils daily. ) 16 g 0  . furosemide (LASIX) 40 MG tablet TAKE 1 TABLET BY MOUTH  DAILY 90 tablet 3  . hydrALAZINE (APRESOLINE) 50 MG tablet Take 0.5 tablets (25 mg total) by mouth 3 (three) times daily. 1 tablet 0  . lidocaine-prilocaine (EMLA) cream Apply 1 application topically as directed. To affected area once    . methocarbamol (ROBAXIN) 500 MG tablet Take 1 tablet (500 mg total) by mouth every 6 (six) hours as needed for muscle spasms. 20 tablet 1  . metoprolol tartrate (LOPRESSOR) 50 MG tablet TAKE 1 TABLET BY MOUTH  TWICE DAILY 180 tablet 3  . ondansetron (ZOFRAN-ODT) 4 MG disintegrating tablet Take 1 tablet (4 mg total) by mouth every 6 (six) hours as needed for nausea. 20 tablet 0  . potassium chloride SA (KLOR-CON) 20 MEQ tablet Take 20 mEq by mouth daily.    . sodium chloride (OCEAN) 0.65 % SOLN nasal spray Place 1 spray into both  nostrils as needed for congestion.    . traMADol (ULTRAM) 50 MG tablet Take 1 tablet (50 mg total) by mouth every 6 (six) hours as needed (mild pain). 20 tablet 0  . warfarin (COUMADIN) 5 MG tablet TAKE 1 TABLET BY MOUTH  DAILY OR AS DIRECTED BY  ANTICOAGULATION CLINIC 100 tablet 3   No current facility-administered medications for this visit.    PHYSICAL EXAMINATION: ECOG PERFORMANCE STATUS: 1 - Symptomatic but completely ambulatory  Vitals:   08/19/20 0959  BP: 129/78  Pulse: (!) 58  Resp: 18  Temp: (!) 97 F (36.1 C)  SpO2: 99%   Filed Weights   08/19/20 0959  Weight: 197 lb 4.8 oz (89.5 kg)    LABORATORY DATA:  I have reviewed the data as listed CMP Latest Ref Rng & Units 07/08/2020 05/27/2020 04/16/2020  Glucose 70 - 99  mg/dL 82 86 98  BUN 8 - 23 mg/dL _0 Creatinine 0.44 - 1.00 mg/dL 1.70(H) 1.77(H) 1.75(H)  Sodium 135 - 145 mmol/L 143 143 144  Potassium 3.5 - 5.1 mmol/L 3.8 3.7 3.8  Chloride 98 - 111 mmol/L 110 108 110  CO2 22 - 32 mmol/L _1 Calcium 8.9 - 10.3 mg/dL 9.2 9.3 9.2  Total Protein 6.5 - 8.1 g/dL 7.1 7.1 7.1  Total Bilirubin 0.3 - 1.2 mg/dL 0.4 0.3 0.3  Alkaline Phos 38 - 126 U/L 116 121 122  AST 15 - 41 U/L _2 ALT 0 - 44 U/L _3 Lab Results  Component Value Date   WBC 4.7 08/19/2020   HGB 11.3 (L) 08/19/2020   HCT 36.0 08/19/2020   MCV 89.1 08/19/2020   PLT 200 08/19/2020   NEUTROABS 2.7 08/19/2020    ASSESSMENT & PLAN:  Malignant neoplasm of overlapping sites of left breast in female, estrogen receptor negative ([place]) 07/31/2020:Patient palpated left breast lump and skin thickening. Mammogram showed a 3.6cm mass at 3:30 position, a 0.9cm mass at 3:00 position, a 0.7cm mass at 2:00 position, a 0.5cm mass at 2:00 position, a 0.3cm mass at 1:00 position, and a 0.4cm mass at 1:00 position, with 4 abnormal left axillary lymph nodes. Biopsy showed IDC, grade 3, HER-2 + (3+), ER/PR -, Ki67 70%, in the breast and lymph nodes.  Treatment plan 1. Neoadjuvant chemotherapy with [place] Perjeta 6 cyclescompleted 01/02/2020 followed by Herceptinmaintenance  2. Followed bymastectomy withaxillary lymph node dissection 3. Followed by adjuvant radiation therapyto be completed 07/12/2020  03/09/2020:Left mastectomy and left axillary lymph node dissection ([place]): no residual carcinoma, 2 negative intramammary lymph nodes, and 19 negative left axillary lymph nodes. --------------------------------------------------------------------------------------------------------------------------------------------- Current treatment: Maintenance Herceptin Herceptin toxicity: Occasional loose stools  05/19/2020: EF 50 to 55%: Same as previous Left arm lymphedema: Patient is  using Velcro'straps to keep the fluid down and it is working very well.  She is working with physical therapy. Currently undergoing radiation.  This will be completed 07/12/2020.  Radiation dermatitis is noted. Today is her last cycle of Herceptin. She has an appointment to get the port removed on December 30.  I ordered a right breast mammogram. Return to clinic in 6 months for surveillance check and follow-up.    No orders of the defined types were placed in this encounter.  The patient has a good understanding of the overall plan. she agrees with it. she will call with any problems that may develop before the next visit here.  Total time spent: 30 mins  including face to face time and time spent for planning, charting and coordination of care  Nicholas Lose, MD 08/19/2020  I, Cloyde Reams Dorshimer, am acting as scribe for Dr. Nicholas Lose.  I have reviewed the above documentation for accuracy and completeness, and I agree with the above.

## 2020-08-18 NOTE — Assessment & Plan Note (Signed)
07/31/2020:Patient palpated left breast lump and skin thickening. Mammogram showed a 3.6cm mass at 3:30 position, a 0.9cm mass at 3:00 position, a 0.7cm mass at 2:00 position, a 0.5cm mass at 2:00 position, a 0.3cm mass at 1:00 position, and a 0.4cm mass at 1:00 position, with 4 abnormal left axillary lymph nodes. Biopsy showed IDC, grade 3, HER-2 + (3+), ER/PR -, Ki67 70%, in the breast and lymph nodes.  Treatment plan 1. Neoadjuvant chemotherapy with [place] Perjeta 6 cyclescompleted 01/02/2020 followed by Herceptinmaintenance  2. Followed bymastectomy withaxillary lymph node dissection 3. Followed by adjuvant radiation therapyto be completed 07/12/2020  03/09/2020:Left mastectomy and left axillary lymph node dissection ([place]): no residual carcinoma, 2 negative intramammary lymph nodes, and 19 negative left axillary lymph nodes. --------------------------------------------------------------------------------------------------------------------------------------------- Current treatment: Maintenance Herceptin Herceptin toxicity: Occasional loose stools  05/19/2020: EF 50 to 55%: Same as previous Left arm lymphedema: Patient is using Velcro'straps to keep the fluid down and it is working very well.  She is working with physical therapy. Currently undergoing radiation.  This will be completed 07/12/2020.  Radiation dermatitis is noted.  Return to clinic every 3 weeks for maintenance therapy and every 6 weeks for follow-up withme

## 2020-08-19 ENCOUNTER — Inpatient Hospital Stay (HOSPITAL_BASED_OUTPATIENT_CLINIC_OR_DEPARTMENT_OTHER): Payer: Medicare Other | Admitting: Hematology and Oncology

## 2020-08-19 ENCOUNTER — Inpatient Hospital Stay: Payer: Medicare Other | Attending: Hematology and Oncology

## 2020-08-19 ENCOUNTER — Other Ambulatory Visit: Payer: Self-pay

## 2020-08-19 ENCOUNTER — Encounter: Payer: Self-pay | Admitting: *Deleted

## 2020-08-19 ENCOUNTER — Inpatient Hospital Stay: Payer: Medicare Other

## 2020-08-19 ENCOUNTER — Telehealth: Payer: Self-pay | Admitting: Hematology and Oncology

## 2020-08-19 ENCOUNTER — Other Ambulatory Visit: Payer: Self-pay | Admitting: *Deleted

## 2020-08-19 DIAGNOSIS — Z7901 Long term (current) use of anticoagulants: Secondary | ICD-10-CM | POA: Insufficient documentation

## 2020-08-19 DIAGNOSIS — Z9012 Acquired absence of left breast and nipple: Secondary | ICD-10-CM | POA: Insufficient documentation

## 2020-08-19 DIAGNOSIS — Z923 Personal history of irradiation: Secondary | ICD-10-CM | POA: Insufficient documentation

## 2020-08-19 DIAGNOSIS — C50812 Malignant neoplasm of overlapping sites of left female breast: Secondary | ICD-10-CM

## 2020-08-19 DIAGNOSIS — Z171 Estrogen receptor negative status [ER-]: Secondary | ICD-10-CM | POA: Insufficient documentation

## 2020-08-19 DIAGNOSIS — Z5112 Encounter for antineoplastic immunotherapy: Secondary | ICD-10-CM | POA: Insufficient documentation

## 2020-08-19 DIAGNOSIS — Z79899 Other long term (current) drug therapy: Secondary | ICD-10-CM | POA: Insufficient documentation

## 2020-08-19 LAB — CBC WITH DIFFERENTIAL (CANCER CENTER ONLY)
Abs Immature Granulocytes: 0.01 10*3/uL (ref 0.00–0.07)
Basophils Absolute: 0 10*3/uL (ref 0.0–0.1)
Basophils Relative: 1 %
Eosinophils Absolute: 0.2 10*3/uL (ref 0.0–0.5)
Eosinophils Relative: 4 %
HCT: 36 % (ref 36.0–46.0)
Hemoglobin: 11.3 g/dL — ABNORMAL LOW (ref 12.0–15.0)
Immature Granulocytes: 0 %
Lymphocytes Relative: 28 %
Lymphs Abs: 1.3 10*3/uL (ref 0.7–4.0)
MCH: 28 pg (ref 26.0–34.0)
MCHC: 31.4 g/dL (ref 30.0–36.0)
MCV: 89.1 fL (ref 80.0–100.0)
Monocytes Absolute: 0.5 10*3/uL (ref 0.1–1.0)
Monocytes Relative: 10 %
Neutro Abs: 2.7 10*3/uL (ref 1.7–7.7)
Neutrophils Relative %: 57 %
Platelet Count: 200 10*3/uL (ref 150–400)
RBC: 4.04 MIL/uL (ref 3.87–5.11)
RDW: 14.2 % (ref 11.5–15.5)
WBC Count: 4.7 10*3/uL (ref 4.0–10.5)
nRBC: 0 % (ref 0.0–0.2)

## 2020-08-19 LAB — CMP (CANCER CENTER ONLY)
ALT: 17 U/L (ref 0–44)
AST: 21 U/L (ref 15–41)
Albumin: 3.6 g/dL (ref 3.5–5.0)
Alkaline Phosphatase: 126 U/L (ref 38–126)
Anion gap: 8 (ref 5–15)
BUN: 19 mg/dL (ref 8–23)
CO2: 28 mmol/L (ref 22–32)
Calcium: 9.1 mg/dL (ref 8.9–10.3)
Chloride: 106 mmol/L (ref 98–111)
Creatinine: 1.97 mg/dL — ABNORMAL HIGH (ref 0.44–1.00)
GFR, Estimated: 27 mL/min — ABNORMAL LOW (ref >60)
Glucose, Bld: 88 mg/dL (ref 70–99)
Potassium: 4.1 mmol/L (ref 3.5–5.1)
Sodium: 142 mmol/L (ref 135–145)
Total Bilirubin: 0.4 mg/dL (ref 0.3–1.2)
Total Protein: 7.2 g/dL (ref 6.5–8.1)

## 2020-08-19 LAB — PROTIME-INR
INR: 1.7 — ABNORMAL HIGH (ref 0.8–1.2)
Prothrombin Time: 19.5 seconds — ABNORMAL HIGH (ref 11.4–15.2)

## 2020-08-19 MED ORDER — ACETAMINOPHEN 325 MG PO TABS
650.0000 mg | ORAL_TABLET | Freq: Once | ORAL | Status: AC
  Administered 2020-08-19: 650 mg via ORAL

## 2020-08-19 MED ORDER — ACETAMINOPHEN 325 MG PO TABS
ORAL_TABLET | ORAL | Status: AC
  Filled 2020-08-19: qty 2

## 2020-08-19 MED ORDER — SODIUM CHLORIDE 0.9% FLUSH
10.0000 mL | INTRAVENOUS | Status: DC | PRN
  Administered 2020-08-19: 10 mL
  Filled 2020-08-19: qty 10

## 2020-08-19 MED ORDER — TRASTUZUMAB-ANNS CHEMO 150 MG IV SOLR
6.0000 mg/kg | Freq: Once | INTRAVENOUS | Status: AC
  Administered 2020-08-19: 504 mg via INTRAVENOUS
  Filled 2020-08-19: qty 24

## 2020-08-19 MED ORDER — HEPARIN SOD (PORK) LOCK FLUSH 100 UNIT/ML IV SOLN
500.0000 [IU] | Freq: Once | INTRAVENOUS | Status: AC | PRN
  Administered 2020-08-19: 500 [IU]
  Filled 2020-08-19: qty 5

## 2020-08-19 MED ORDER — DIPHENHYDRAMINE HCL 25 MG PO CAPS
50.0000 mg | ORAL_CAPSULE | Freq: Once | ORAL | Status: AC
  Administered 2020-08-19: 50 mg via ORAL

## 2020-08-19 MED ORDER — DIPHENHYDRAMINE HCL 25 MG PO CAPS
ORAL_CAPSULE | ORAL | Status: AC
  Filled 2020-08-19: qty 2

## 2020-08-19 MED ORDER — SODIUM CHLORIDE 0.9 % IV SOLN
Freq: Once | INTRAVENOUS | Status: AC
  Filled 2020-08-19: qty 250

## 2020-08-19 NOTE — Telephone Encounter (Signed)
Scheduled appts per 11/18 los. Gave pt a print out of AVS,

## 2020-08-19 NOTE — Progress Notes (Signed)
Per MD okay to treat today with crt. 1.97.

## 2020-08-27 ENCOUNTER — Other Ambulatory Visit (HOSPITAL_COMMUNITY)
Admission: RE | Admit: 2020-08-27 | Discharge: 2020-08-27 | Disposition: A | Discharge status: Home / Self Care | Payer: Medicare Other | Source: Ambulatory Visit | Attending: General Surgery | Admitting: General Surgery

## 2020-08-27 DIAGNOSIS — Z01812 Encounter for preprocedural laboratory examination: Secondary | ICD-10-CM | POA: Diagnosis not present

## 2020-08-27 DIAGNOSIS — Z20822 Contact with and (suspected) exposure to covid-19: Secondary | ICD-10-CM | POA: Insufficient documentation

## 2020-08-27 LAB — SARS CORONAVIRUS 2 (TAT 6-24 HRS): SARS Coronavirus 2: NEGATIVE

## 2020-08-29 ENCOUNTER — Telehealth: Payer: Self-pay | Admitting: Cardiology

## 2020-08-30 ENCOUNTER — Other Ambulatory Visit: Payer: Self-pay

## 2020-08-30 ENCOUNTER — Encounter (HOSPITAL_COMMUNITY): Payer: Self-pay | Admitting: General Surgery

## 2020-08-30 NOTE — Progress Notes (Addendum)
Anesthesia Chart Review: Same day workup  Follows with cardiology for hx of nonischemic cardiomyopathy andaortic insufficiency. Previous catheterization in 2004 showed no obstructive coronary disease. CT of her chest in Nov 2008 showed no thoracic aneurysm. Previous Holter monitor secondary to "dizzy spells" showed PACs and PVCs. Nuclear study April 2014 showed an ejection fraction of 62% and normal perfusion. Patient underwent transesophageal echocardiogram in May of 2014. Her ejection fraction was 50-55%. There was moderate aortic insufficiency and mild mitral regurgitation. There was a linear density associated with the atrial septum of uncertain etiology. Patient has had occasional noncompliance in the past with medications. If blood pressure not controlled LV function noted to be reduced with worsening aortic insufficiency. Most recent echocardiogram  05/19/2020 showed EF 50-55%, grade 2 DD, mild MR, mod AR.  Patient underwent left modified radical mastectomy on 0/10/7508 without complication.  Prior to that surgery she was given cardiac clearance per telephone encounter 01/20/20, "Chart reviewed as part of pre-operative protocol coverage. Patient was contacted4/20/2021in reference to pre-operative risk assessment for pending surgery as outlined below. Chaniece Barbato last seen on 2/3/21by Dr. Stanford Breed. Since that day, Itzell Bendavid done well.Recent echo with normal EF. She can complete 4.0 METS without angina. Therefore, based on ACC/AHA guidelines, the patient would be at acceptable risk for the planned procedure without further cardiovascular testing.  "  Pt on coumadin for hx of recurrent DVT, followed by PCP.  I spoke with nursing triage in Dr. Marlowe Aschoff office who stated Dr. Barry Dienes had instructed the patient to stop Coumadin 5 days preop.  Follows Dr. Sonny Dandy for breast cancer.  She completed neoadjuvant chemotherapy, underwent left mastectomy, radiation, and is currently on  maintenance Herceptin.  Most recent labs from 08/19/2020 show creatinine 1.97 which is near her baseline. Anemia with Hgb 11.3 which is slightly above her recent baseline.  She will need day of surgery evaluation.  EKG 10/03/19: Sinus bradycardia. Rate 59. Left axis deviation. Moderate voltage criteria for LVH, may be normal variant. Nonspecific T wave abnormality  TTE 05/19/20: 1. Left ventricular ejection fraction, by estimation, is 50 to 55%. The  left ventricle has low normal function. The left ventricle has no regional  wall motion abnormalities. Left ventricular diastolic parameters are  consistent with Grade II diastolic  dysfunction (pseudonormalization). The average left ventricular global  longitudinal strain is -18.7 %. The global longitudinal strain is normal.  2. Right ventricular systolic function is normal. The right ventricular  size is normal. There is normal pulmonary artery systolic pressure. The  estimated right ventricular systolic pressure is 25.8 mmHg.  3. The mitral valve is grossly normal. Mild mitral valve regurgitation.  No evidence of mitral stenosis.  4. The aortic valve is grossly normal. Aortic valve regurgitation is  moderate. No aortic stenosis is present.  5. The inferior vena cava is normal in size with greater than 50%  respiratory variability, suggesting right atrial pressure of 3 mmHg.   FINDINGS  Left Ventricle: Left ventricular ejection fraction, by estimation, is 50  to 55%. The left ventricle has low normal function. The left ventricle has  no regional wall motion abnormalities. The average left ventricular global  longitudinal strain is -18.7  %. The global longitudinal strain is normal. The left ventricular internal  cavity size was normal in size. There is no left ventricular hypertrophy.  Left ventricular diastolic parameters are consistent with Grade II  diastolic dysfunction  (pseudonormalization).    Karoline Caldwell, PA-C [facility]  Short Stay Center/Anesthesiology Phone [phone]  08/30/2020 9:48 AM

## 2020-08-30 NOTE — Anesthesia Preprocedure Evaluation (Addendum)
Anesthesia Evaluation  Patient identified by MRN, date of birth, ID band Patient awake    Reviewed: Allergy & Precautions, NPO status , Patient's Chart, lab work & pertinent test results, reviewed documented beta blocker date and time   History of Anesthesia Complications Negative for: history of anesthetic complications  Airway Mallampati: III  TM Distance: >3 FB Neck ROM: Full  Mouth opening: Limited Mouth Opening  Dental  (+) Dental Advisory Given   Pulmonary COPD,  COPD inhaler, former smoker,  08/27/2020 SARS coronavirus NEG   breath sounds clear to auscultation       Cardiovascular hypertension, Pt. on medications and Pt. on home beta blockers (-) angina+ Peripheral Vascular Disease and + DVT  (-) CAD + Valvular Problems/Murmurs AI and MR  Rhythm:Regular Rate:Normal  05/2020 ECHO: EF 50-55%, mild MR, mod AI   Neuro/Psych  Headaches,    GI/Hepatic negative GI ROS, Neg liver ROS,   Endo/Other  obese  Renal/GU Renal InsufficiencyRenal disease     Musculoskeletal  (+) Arthritis ,   Abdominal (+) + obese,   Peds  Hematology coumadin   Anesthesia Other Findings Breast cancer  Reproductive/Obstetrics                           Anesthesia Physical Anesthesia Plan  ASA: III  Anesthesia Plan: MAC   Post-op Pain Management:    Induction: Intravenous  PONV Risk Score and Plan: 2 and Ondansetron, Dexamethasone and Treatment may vary due to age or medical condition  Airway Management Planned:   Additional Equipment: None  Intra-op Plan:   Post-operative Plan:   Informed Consent: I have reviewed the patients History and Physical, chart, labs and discussed the procedure including the risks, benefits and alternatives for the proposed anesthesia with the patient or authorized representative who has indicated his/her understanding and acceptance.     Dental advisory given  Plan Discussed  with: CRNA and Surgeon  Anesthesia Plan Comments: (PAT note by Karoline Caldwell, PA-C:  Follows with cardiology for hx of nonischemic cardiomyopathy andaortic insufficiency. Previous catheterization in 2004 showed no obstructive coronary disease. CT of her chest in Nov 2008 showed no thoracic aneurysm. Previous Holter monitor secondary to "dizzy spells" showed PACs and PVCs. Nuclear study April 2014 showed an ejection fraction of 62% and normal perfusion. Patient underwent transesophageal echocardiogram in May of 2014. Her ejection fraction was 50-55%. There was moderate aortic insufficiency and mild mitral regurgitation. There was a linear density associated with the atrial septum of uncertain etiology. Patient has had occasional noncompliance in the past with medications. If blood pressure not controlled LV function noted to be reduced with worsening aortic insufficiency. Most recent echocardiogram  05/19/2020 showed EF 50-55%, grade 2 DD, mild MR, mod AR.  Patient underwent left modified radical mastectomy on 12/08/7562 without complication.  Prior to that surgery she was given cardiac clearance per telephone encounter 01/20/20, "Chart reviewed as part of pre-operative protocol coverage. Patient was contacted4/20/2021in reference to pre-operative risk assessment for pending surgery as outlined below. Leydi Winstead last seen on 2/3/21by Dr. Stanford Breed. Since that day, Jalexia Lalli done well.Recent echo with normal EF. She can complete 4.0 METS without angina. Therefore, based on ACC/AHA guidelines, the patient would be at acceptable risk for the planned procedure without further cardiovascular testing.  "  Pt on coumadin for hx of recurrent DVT, followed by PCP.  I spoke with nursing triage in Dr. Marlowe Aschoff office who stated Dr. Barry Dienes had instructed  the patient to stop Coumadin 5 days preop.  Follows Dr. Sonny Dandy for breast cancer.  She completed neoadjuvant chemotherapy, underwent left  mastectomy, radiation, and is currently on maintenance Herceptin.  Most recent labs from 08/19/2020 show creatinine 1.97 which is near her baseline. Anemia with Hgb 11.3 which is slightly above her recent baseline.  She will need day of surgery evaluation.  EKG 10/03/19: Sinus bradycardia. Rate 59. Left axis deviation. Moderate voltage criteria for LVH, may be normal variant. Nonspecific T wave abnormality  TTE 05/19/20: 1. Left ventricular ejection fraction, by estimation, is 50 to 55%. The  left ventricle has low normal function. The left ventricle has no regional  wall motion abnormalities. Left ventricular diastolic parameters are  consistent with Grade II diastolic  dysfunction (pseudonormalization). The average left ventricular global  longitudinal strain is -18.7 %. The global longitudinal strain is normal.  2. Right ventricular systolic function is normal. The right ventricular  size is normal. There is normal pulmonary artery systolic pressure. The  estimated right ventricular systolic pressure is 68.0 mmHg.  3. The mitral valve is grossly normal. Mild mitral valve regurgitation.  No evidence of mitral stenosis.  4. The aortic valve is grossly normal. Aortic valve regurgitation is  moderate. No aortic stenosis is present.  5. The inferior vena cava is normal in size with greater than 50%  respiratory variability, suggesting right atrial pressure of 3 mmHg.   FINDINGS  Left Ventricle: Left ventricular ejection fraction, by estimation, is 50  to 55%. The left ventricle has low normal function. The left ventricle has  no regional wall motion abnormalities. The average left ventricular global  longitudinal strain is -18.7  %. The global longitudinal strain is normal. The left ventricular internal  cavity size was normal in size. There is no left ventricular hypertrophy.  Left ventricular diastolic parameters are consistent with Grade II  diastolic dysfunction   (pseudonormalization).   )    Anesthesia Quick Evaluation

## 2020-08-30 NOTE — Progress Notes (Signed)
  Radiation Oncology         [phone] ________________________________  Name: Shalaya Swailes MRN: [id number]  Date: 07/12/2020  DOB: March 29, 1951  End of Treatment Note  Diagnosis:   left-sided breast cancer     Indication for treatment:  Curative       Radiation treatment dates:   05/26/20 - 07/12/20  Site/dose:   The patient initially received a dose of 50.4 Gy in 28 fractions to the chest wall and supraclavicular region. This was delivered using a 3-D conformal, 4 field technique. The patient then received a boost to the mastectomy scar. This delivered an additional 10 Gy in 5 fractions using an en face electron field. The total dose was 60.4 Gy.   Narrative: The patient tolerated radiation treatment relatively well.   The patient had some expected skin irritation as she progressed during treatment.    Plan: The patient has completed radiation treatment. The patient will return to radiation oncology clinic for routine followup in one month. I advised the patient to call or return sooner if they have any questions or concerns related to their recovery or treatment. ________________________________  Jodelle Gross, M.D., Ph.D.

## 2020-08-30 NOTE — H&P (Signed)
[name] Foster is an 69 y.o. female.   Chief Complaint: unneeded port for left breast cancer. HPI:  Pt was dx with left breast cancer 07/2019.  She had neoadjuvant chemo and then modified radical mastectomy 03/09/2020. She did well with this.  She has no clinical evidence of disease and desires port removal.    Past Medical History:  Diagnosis Date  . Allergic rhinitis   . Anemia   . Aortic valve disorders   . Arthritis   . Asthma   . Cancer [facility])    Breast Cancer  . CHF (congestive heart failure) ([place])   . Coronary artery disease   . Family history of adverse reaction to anesthesia    difficulty waking mother up after surgery  . Family history of breast cancer   . Family history of cervical cancer   . Family history of colon cancer   . Family history of throat cancer   . Heart murmur   . Hyperlipidemia   . Hypertension   . Long term (current) use of anticoagulants   . Other primary cardiomyopathies   . Peripheral vascular disease ([facility])    right leg  . Personal history of colonic polyps   . Personal history of venous thrombosis and embolism     Past Surgical History:  Procedure Laterality Date  . ABDOMINAL HYSTERECTOMY    . BREAST BIOPSY Left 07/2019  . CARDIAC CATHETERIZATION  2004  . COLON SURGERY     colonoscopy  . DILATION AND CURETTAGE OF UTERUS     several  . MASTECTOMY MODIFIED RADICAL Left 03/09/2020   Left Modified Radical Mastectomy (left mastectomy with axillary lymph node dissection)   . MASTECTOMY MODIFIED RADICAL Left 03/09/2020   Procedure: LEFT MODIFIED RADICAL MASTECTOMY;  Surgeon: Stark Klein, MD;  Location: [place];  Service: General;  Laterality: Left;  GEN AND PECTORAL BLOCK  . PORTACATH PLACEMENT Left 08/18/2019   Procedure: INSERTION PORT-A-CATH;  Surgeon: Stark Klein, MD;  Location: [place];  Service: General;  Laterality: Left;  . Rt knee arthoscopic    . TEE WITHOUT CARDIOVERSION N/A 02/21/2013   Procedure: TRANSESOPHAGEAL ECHOCARDIOGRAM  (TEE);  Surgeon: Lelon Perla, MD;  Location[facility] ENDOSCOPY;  Service: Cardiovascular;  Laterality: N/A;    Family History  Problem Relation Age of Onset  . Alcohol abuse Other   . Depression Other   . Hyperlipidemia Other   . Hypertension Other   . Kidney disease Other   . Colon cancer Mother        dx late 69s  . Cervical cancer Maternal Grandmother   . Stroke Paternal Grandmother   . Breast cancer Maternal Aunt        dx 104s  . Breast cancer Cousin        dx 52s   Social History:  reports that she quit smoking about 14 months ago. Her smoking use included cigarettes. She smoked 0.25 packs per day. She has never used smokeless tobacco. She reports current alcohol use. She reports that she does not use drugs.  Allergies:  Allergies  Allergen Reactions  . Penicillins Rash    Did it involve swelling of the face/tongue/throat, SOB, or low BP? Unknown Did it involve sudden or severe rash/hives, skin peeling, or any reaction on the inside of your mouth or nose? Yes Did you need to seek medical attention at a hospital or doctor's office? Yes When did it last happen?in her 58s If all above answers are "NO", may proceed with  cephalosporin use.   . Tetanus Toxoid Rash    Caused cellulitis   . Aspirin Rash    Medications Prior to Admission  Medication Sig Dispense Refill  . amLODipine (NORVASC) 10 MG tablet TAKE 1 TABLET BY MOUTH  DAILY (Patient taking differently: Take 10 mg by mouth daily. ) 90 tablet 3  . atorvastatin (LIPITOR) 20 MG tablet TAKE 1 TABLET BY MOUTH  DAILY (Patient taking differently: Take 20 mg by mouth at bedtime. ) 90 tablet 3  . Carboxymethylcellul-Glycerin (CLEAR EYES FOR DRY EYES) 1-0.25 % SOLN Place 1 drop into both eyes 3 (three) times daily as needed (dry eyes).     . Cholecalciferol (VITAMIN D) 50 MCG (2000 UT) tablet Take 2,000 Units by mouth daily.    . fexofenadine (ALLEGRA) 180 MG tablet Take 180 mg by mouth daily as needed for allergies.      . fluticasone (FLONASE) 50 MCG/ACT nasal spray USE 2 SPRAYS IN EACH NOSTRIL DAILY. PT NEEDS TO SCHEDULE A FOLLOW UP APPT BEFORE NEXT REFILL. (Patient taking differently: Place 2 sprays into both nostrils daily. ) 16 g 0  . hydrALAZINE (APRESOLINE) 50 MG tablet Take 0.5 tablets (25 mg total) by mouth 3 (three) times daily. (Patient taking differently: Take 50 mg by mouth 3 (three) times daily. ) 1 tablet 0  . methocarbamol (ROBAXIN) 500 MG tablet Take 1 tablet (500 mg total) by mouth every 6 (six) hours as needed for muscle spasms. 20 tablet 1  . metoprolol tartrate (LOPRESSOR) 50 MG tablet TAKE 1 TABLET BY MOUTH  TWICE DAILY (Patient taking differently: Take 50 mg by mouth in the morning and at bedtime. ) 180 tablet 3  . sodium chloride (OCEAN) 0.65 % SOLN nasal spray Place 1 spray into both nostrils as needed for congestion.    . traMADol (ULTRAM) 50 MG tablet Take 1 tablet (50 mg total) by mouth every 6 (six) hours as needed (mild pain). 20 tablet 0  . warfarin (COUMADIN) 5 MG tablet TAKE 1 TABLET BY MOUTH  DAILY OR AS DIRECTED BY  ANTICOAGULATION CLINIC (Patient taking differently: Take 5 mg by mouth at bedtime. TAKE 1 TABLET BY MOUTH  DAILY OR AS DIRECTED BY  ANTICOAGULATION CLINIC) 100 tablet 3  . Wound Dressings (SONAFINE EX) Apply 1 application topically in the morning and at bedtime.    . Albuterol Sulfate (PROAIR RESPICLICK) 454 (90 Base) MCG/ACT AEPB Inhale 2 puffs into the lungs every 6 (six) hours as needed. (Patient taking differently: Inhale 2 puffs into the lungs every 6 (six) hours as needed (SOB / wheezing). ) 2 each 1  . enoxaparin (LOVENOX) 80 MG/0.8ML injection Inject 0.8 mLs (80 mg total) into the skin daily for 11 days. To begin on 03/04/2020 (Patient not taking: Reported on 08/30/2020) 8.8 mL 0  . furosemide (LASIX) 40 MG tablet TAKE 1 TABLET BY MOUTH  DAILY 90 tablet 3  . ondansetron (ZOFRAN-ODT) 4 MG disintegrating tablet Take 1 tablet (4 mg total) by mouth every 6 (six) hours as  needed for nausea. (Patient not taking: Reported on 08/30/2020) 20 tablet 0  . potassium chloride SA (KLOR-CON) 20 MEQ tablet Take 20 mEq by mouth daily.      No results found for this or any previous visit (from the past 48 hour(s)). No results found.  Review of Systems  All other systems reviewed and are negative.    Blood pressure (!) 143/69, pulse 64, temperature 97.8 F (36.6 C), temperature source Temporal, resp. rate 18,  height 5\' 6"  (1.676 m), weight 89.4 kg, SpO2 100 %.   Physical Exam Vitals reviewed.  Constitutional:      Appearance: Normal appearance.  Eyes:     General: No scleral icterus.    Pupils: Pupils are equal, round, and reactive to light.  Cardiovascular:     Rate and Rhythm: Normal rate and regular rhythm.  Pulmonary:     Effort: Pulmonary effort is normal.     Comments: Left sided port in place Musculoskeletal:        General: Normal range of motion.     Cervical back: Normal range of motion and neck supple.  Skin:    Capillary Refill: Capillary refill takes 2 to 3 seconds.  Neurological:     General: No focal deficit present.     Mental Status: She is alert.  Psychiatric:        Mood and Affect: Mood normal.        Thought Content: Thought content normal.        Judgment: Judgment normal.         Assessment/Plan Left breast cancer, s/p chemo, MRM, and XRT.    She desires port removal.   I reviewed procedure and risks.    Stark Klein, MD 08/31/2020, 7:33 AM

## 2020-08-30 NOTE — Progress Notes (Signed)
Patient denies shortness of breath, fever, cough or chest pain.  PCP - Dr Shanon Ace Cardiologist - Dr Stanford Breed Oncology - Dr Lindi Adie  Chest x-ray - 09/21/19 (1V) EKG - 10/03/19 Stress Test - 01/08/13 ECHO - 05/19/20 Cardiac Cath - 12/16/2002  Blood Thinner Instructions:  Follow your surgeon's instructions on when to stop coumadin prior to surgery.  Patient stated last dose of coumadin was on 08/27/20.  Anesthesia review: Yes  STOP now taking any Aspirin (unless otherwise instructed by your surgeon), Aleve, Naproxen, Ibuprofen, Motrin, Advil, Goody's, BC's, all herbal medications, fish oil, and all vitamins.   Coronavirus Screening Covid test on 08/27/20 was negative.  Patient verbalized understanding of instructions that were given via phone.

## 2020-08-31 ENCOUNTER — Ambulatory Visit (HOSPITAL_COMMUNITY): Payer: Medicare Other | Admitting: Physician Assistant

## 2020-08-31 ENCOUNTER — Ambulatory Visit (HOSPITAL_COMMUNITY)
Admission: RE | Admit: 2020-08-31 | Discharge: 2020-08-31 | Disposition: A | Discharge status: Home / Self Care | Payer: Medicare Other | Attending: General Surgery | Admitting: General Surgery

## 2020-08-31 ENCOUNTER — Encounter (HOSPITAL_COMMUNITY)
Admission: RE | Disposition: A | Discharge status: Home / Self Care | Payer: Self-pay | Source: Home / Self Care | Attending: General Surgery

## 2020-08-31 ENCOUNTER — Encounter (HOSPITAL_COMMUNITY): Payer: Self-pay | Admitting: General Surgery

## 2020-08-31 DIAGNOSIS — Z803 Family history of malignant neoplasm of breast: Secondary | ICD-10-CM | POA: Insufficient documentation

## 2020-08-31 DIAGNOSIS — I2510 Atherosclerotic heart disease of native coronary artery without angina pectoris: Secondary | ICD-10-CM | POA: Insufficient documentation

## 2020-08-31 DIAGNOSIS — J449 Chronic obstructive pulmonary disease, unspecified: Secondary | ICD-10-CM | POA: Insufficient documentation

## 2020-08-31 DIAGNOSIS — Z9221 Personal history of antineoplastic chemotherapy: Secondary | ICD-10-CM | POA: Insufficient documentation

## 2020-08-31 DIAGNOSIS — Z887 Allergy status to serum and vaccine status: Secondary | ICD-10-CM | POA: Insufficient documentation

## 2020-08-31 DIAGNOSIS — Z8249 Family history of ischemic heart disease and other diseases of the circulatory system: Secondary | ICD-10-CM | POA: Diagnosis not present

## 2020-08-31 DIAGNOSIS — Z853 Personal history of malignant neoplasm of breast: Secondary | ICD-10-CM | POA: Diagnosis not present

## 2020-08-31 DIAGNOSIS — I739 Peripheral vascular disease, unspecified: Secondary | ICD-10-CM | POA: Diagnosis not present

## 2020-08-31 DIAGNOSIS — Z7901 Long term (current) use of anticoagulants: Secondary | ICD-10-CM | POA: Insufficient documentation

## 2020-08-31 DIAGNOSIS — Z79899 Other long term (current) drug therapy: Secondary | ICD-10-CM | POA: Diagnosis not present

## 2020-08-31 DIAGNOSIS — R011 Cardiac murmur, unspecified: Secondary | ICD-10-CM | POA: Insufficient documentation

## 2020-08-31 DIAGNOSIS — Z800 Family history of malignant neoplasm of digestive organs: Secondary | ICD-10-CM | POA: Insufficient documentation

## 2020-08-31 DIAGNOSIS — Z8349 Family history of other endocrine, nutritional and metabolic diseases: Secondary | ICD-10-CM | POA: Insufficient documentation

## 2020-08-31 DIAGNOSIS — I509 Heart failure, unspecified: Secondary | ICD-10-CM | POA: Diagnosis not present

## 2020-08-31 DIAGNOSIS — Z811 Family history of alcohol abuse and dependence: Secondary | ICD-10-CM | POA: Diagnosis not present

## 2020-08-31 DIAGNOSIS — E669 Obesity, unspecified: Secondary | ICD-10-CM | POA: Insufficient documentation

## 2020-08-31 DIAGNOSIS — Z86718 Personal history of other venous thrombosis and embolism: Secondary | ICD-10-CM | POA: Insufficient documentation

## 2020-08-31 DIAGNOSIS — Z6831 Body mass index (BMI) 31.0-31.9, adult: Secondary | ICD-10-CM | POA: Diagnosis not present

## 2020-08-31 DIAGNOSIS — E559 Vitamin D deficiency, unspecified: Secondary | ICD-10-CM | POA: Diagnosis not present

## 2020-08-31 DIAGNOSIS — Z841 Family history of disorders of kidney and ureter: Secondary | ICD-10-CM | POA: Insufficient documentation

## 2020-08-31 DIAGNOSIS — I110 Hypertensive heart disease with heart failure: Secondary | ICD-10-CM | POA: Diagnosis not present

## 2020-08-31 DIAGNOSIS — Z87891 Personal history of nicotine dependence: Secondary | ICD-10-CM | POA: Diagnosis not present

## 2020-08-31 DIAGNOSIS — Z8049 Family history of malignant neoplasm of other genital organs: Secondary | ICD-10-CM | POA: Diagnosis not present

## 2020-08-31 DIAGNOSIS — Z9012 Acquired absence of left breast and nipple: Secondary | ICD-10-CM | POA: Diagnosis not present

## 2020-08-31 DIAGNOSIS — Z880 Allergy status to penicillin: Secondary | ICD-10-CM | POA: Insufficient documentation

## 2020-08-31 DIAGNOSIS — Z818 Family history of other mental and behavioral disorders: Secondary | ICD-10-CM | POA: Insufficient documentation

## 2020-08-31 DIAGNOSIS — Z886 Allergy status to analgesic agent status: Secondary | ICD-10-CM | POA: Insufficient documentation

## 2020-08-31 DIAGNOSIS — Z90710 Acquired absence of both cervix and uterus: Secondary | ICD-10-CM | POA: Insufficient documentation

## 2020-08-31 DIAGNOSIS — Z452 Encounter for adjustment and management of vascular access device: Secondary | ICD-10-CM | POA: Insufficient documentation

## 2020-08-31 DIAGNOSIS — Z823 Family history of stroke: Secondary | ICD-10-CM | POA: Insufficient documentation

## 2020-08-31 DIAGNOSIS — E876 Hypokalemia: Secondary | ICD-10-CM | POA: Diagnosis not present

## 2020-08-31 HISTORY — PX: PORT-A-CATH REMOVAL: SHX5289

## 2020-08-31 LAB — BASIC METABOLIC PANEL
Anion gap: 9 (ref 5–15)
BUN: 14 mg/dL (ref 8–23)
CO2: 24 mmol/L (ref 22–32)
Calcium: 8.9 mg/dL (ref 8.9–10.3)
Chloride: 106 mmol/L (ref 98–111)
Creatinine, Ser: 1.87 mg/dL — ABNORMAL HIGH (ref 0.44–1.00)
GFR, Estimated: 29 mL/min — ABNORMAL LOW (ref >60)
Glucose, Bld: 102 mg/dL — ABNORMAL HIGH (ref 70–99)
Potassium: 3.7 mmol/L (ref 3.5–5.1)
Sodium: 139 mmol/L (ref 135–145)

## 2020-08-31 LAB — CBC
HCT: 37.5 % (ref 36.0–46.0)
Hemoglobin: 11.4 g/dL — ABNORMAL LOW (ref 12.0–15.0)
MCH: 28.1 pg (ref 26.0–34.0)
MCHC: 30.4 g/dL (ref 30.0–36.0)
MCV: 92.4 fL (ref 80.0–100.0)
Platelets: 208 10*3/uL (ref 150–400)
RBC: 4.06 MIL/uL (ref 3.87–5.11)
RDW: 13.9 % (ref 11.5–15.5)
WBC: 5.9 10*3/uL (ref 4.0–10.5)
nRBC: 0 % (ref 0.0–0.2)

## 2020-08-31 SURGERY — REMOVAL PORT-A-CATH
Anesthesia: Monitor Anesthesia Care

## 2020-08-31 MED ORDER — FENTANYL CITRATE (PF) 100 MCG/2ML IJ SOLN: 25.0000 ug | INTRAMUSCULAR | Status: DC | PRN

## 2020-08-31 MED ORDER — LIDOCAINE HCL 1 % IJ SOLN
INTRAMUSCULAR | Status: DC | PRN
  Administered 2020-08-31: 10 mL

## 2020-08-31 MED ORDER — CHLORHEXIDINE GLUCONATE CLOTH 2 % EX PADS: 6.0000 | MEDICATED_PAD | Freq: Once | CUTANEOUS | Status: DC

## 2020-08-31 MED ORDER — PROPOFOL 500 MG/50ML IV EMUL
INTRAVENOUS | Status: DC | PRN
  Administered 2020-08-31: 125 ug/kg/min via INTRAVENOUS

## 2020-08-31 MED ORDER — LIDOCAINE HCL 1 % IJ SOLN
INTRAMUSCULAR | Status: AC
  Filled 2020-08-31: qty 20

## 2020-08-31 MED ORDER — PROPOFOL 1000 MG/100ML IV EMUL
INTRAVENOUS | Status: AC
  Filled 2020-08-31: qty 100

## 2020-08-31 MED ORDER — MIDAZOLAM HCL 2 MG/2ML IJ SOLN: 0.5000 mg | Freq: Once | INTRAMUSCULAR | Status: DC | PRN

## 2020-08-31 MED ORDER — MIDAZOLAM HCL 5 MG/5ML IJ SOLN
INTRAMUSCULAR | Status: DC | PRN
  Administered 2020-08-31: 2 mg via INTRAVENOUS

## 2020-08-31 MED ORDER — ACETAMINOPHEN 500 MG PO TABS
1000.0000 mg | ORAL_TABLET | ORAL | Status: AC
  Administered 2020-08-31: 1000 mg via ORAL
  Filled 2020-08-31: qty 2

## 2020-08-31 MED ORDER — ONDANSETRON HCL 4 MG/2ML IJ SOLN
INTRAMUSCULAR | Status: DC | PRN
  Administered 2020-08-31: 4 mg via INTRAVENOUS

## 2020-08-31 MED ORDER — PROMETHAZINE HCL 25 MG/ML IJ SOLN: 6.2500 mg | INTRAMUSCULAR | Status: DC | PRN

## 2020-08-31 MED ORDER — FENTANYL CITRATE (PF) 250 MCG/5ML IJ SOLN
INTRAMUSCULAR | Status: AC
  Filled 2020-08-31: qty 5

## 2020-08-31 MED ORDER — MEPERIDINE HCL 25 MG/ML IJ SOLN: 6.2500 mg | INTRAMUSCULAR | Status: DC | PRN

## 2020-08-31 MED ORDER — DEXAMETHASONE SODIUM PHOSPHATE 10 MG/ML IJ SOLN
INTRAMUSCULAR | Status: DC | PRN
  Administered 2020-08-31: 5 mg via INTRAVENOUS

## 2020-08-31 MED ORDER — GLYCOPYRROLATE 0.2 MG/ML IJ SOLN
INTRAMUSCULAR | Status: DC | PRN
  Administered 2020-08-31: .2 mg via INTRAVENOUS

## 2020-08-31 MED ORDER — LACTATED RINGERS IV SOLN: INTRAVENOUS | Status: DC | PRN

## 2020-08-31 MED ORDER — CHLORHEXIDINE GLUCONATE 0.12 % MT SOLN
15.0000 mL | Freq: Once | OROMUCOSAL | Status: AC
  Administered 2020-08-31: 15 mL via OROMUCOSAL

## 2020-08-31 MED ORDER — CHLORHEXIDINE GLUCONATE 0.12 % MT SOLN
OROMUCOSAL | Status: AC
  Filled 2020-08-31: qty 15

## 2020-08-31 MED ORDER — BUPIVACAINE-EPINEPHRINE (PF) 0.25% -1:200000 IJ SOLN
INTRAMUSCULAR | Status: AC
  Filled 2020-08-31: qty 30

## 2020-08-31 MED ORDER — ORAL CARE MOUTH RINSE: 15.0000 mL | Freq: Once | OROMUCOSAL | Status: AC

## 2020-08-31 MED ORDER — 0.9 % SODIUM CHLORIDE (POUR BTL) OPTIME
TOPICAL | Status: DC | PRN
  Administered 2020-08-31: 1000 mL

## 2020-08-31 MED ORDER — MIDAZOLAM HCL 2 MG/2ML IJ SOLN
INTRAMUSCULAR | Status: AC
  Filled 2020-08-31: qty 2

## 2020-08-31 MED ORDER — OXYCODONE HCL 5 MG PO TABS: 2.5000 mg | ORAL_TABLET | Freq: Four times a day (QID) | ORAL | 0 refills | Status: DC | PRN

## 2020-08-31 MED ORDER — LIDOCAINE HCL (PF) 2 % IJ SOLN
INTRAMUSCULAR | Status: AC
  Filled 2020-08-31: qty 5

## 2020-08-31 MED ORDER — CIPROFLOXACIN IN D5W 400 MG/200ML IV SOLN
400.0000 mg | INTRAVENOUS | Status: AC
  Administered 2020-08-31: 400 mg via INTRAVENOUS
  Filled 2020-08-31: qty 200

## 2020-08-31 MED ORDER — PROPOFOL 10 MG/ML IV BOLUS
INTRAVENOUS | Status: AC
  Filled 2020-08-31: qty 20

## 2020-08-31 MED ORDER — ACETAMINOPHEN 500 MG PO TABS: 1000.0000 mg | ORAL_TABLET | Freq: Once | ORAL | Status: DC

## 2020-08-31 MED ORDER — LIDOCAINE HCL (PF) 1 % IJ SOLN
INTRAMUSCULAR | Status: AC
  Filled 2020-08-31: qty 30

## 2020-08-31 SURGICAL SUPPLY — 34 items
ADH SKN CLS APL DERMABOND .7 (GAUZE/BANDAGES/DRESSINGS) ×1
CHLORAPREP W/TINT 10.5 ML (MISCELLANEOUS) ×5 IMPLANT
COVER SURGICAL LIGHT HANDLE (MISCELLANEOUS) ×3 IMPLANT
COVER WAND RF STERILE (DRAPES) ×1 IMPLANT
DECANTER SPIKE VIAL GLASS SM (MISCELLANEOUS) ×6 IMPLANT
DERMABOND ADVANCED (GAUZE/BANDAGES/DRESSINGS) ×2
DERMABOND ADVANCED .7 DNX12 (GAUZE/BANDAGES/DRESSINGS) ×1 IMPLANT
DRAPE LAPAROTOMY 100X72 PEDS (DRAPES) ×3 IMPLANT
ELECT CAUTERY BLADE 6.4 (BLADE) ×3 IMPLANT
ELECT REM PT RETURN 9FT ADLT (ELECTROSURGICAL) ×3
ELECTRODE REM PT RTRN 9FT ADLT (ELECTROSURGICAL) ×1 IMPLANT
GAUZE 4X4 16PLY RFD (DISPOSABLE) ×3 IMPLANT
GLOVE BIO SURGEON STRL SZ 6 (GLOVE) ×3 IMPLANT
GLOVE BIO SURGEON STRL SZ7.5 (GLOVE) ×2 IMPLANT
GLOVE BIOGEL PI IND STRL 6.5 (GLOVE) IMPLANT
GLOVE BIOGEL PI INDICATOR 6.5 (GLOVE) ×2
GLOVE INDICATOR 6.5 STRL GRN (GLOVE) ×5 IMPLANT
GLOVE SURG SS PI 6.5 STRL IVOR (GLOVE) ×2 IMPLANT
GOWN STRL REUS W/ TWL LRG LVL3 (GOWN DISPOSABLE) ×1 IMPLANT
GOWN STRL REUS W/TWL 2XL LVL3 (GOWN DISPOSABLE) ×3 IMPLANT
GOWN STRL REUS W/TWL LRG LVL3 (GOWN DISPOSABLE) ×3
KIT BASIN OR (CUSTOM PROCEDURE TRAY) ×3 IMPLANT
KIT TURNOVER KIT B (KITS) ×3 IMPLANT
NDL HYPO 25GX1X1/2 BEV (NEEDLE) ×1 IMPLANT
NEEDLE HYPO 25GX1X1/2 BEV (NEEDLE) ×3 IMPLANT
NS IRRIG 1000ML POUR BTL (IV SOLUTION) ×3 IMPLANT
PACK GENERAL/GYN (CUSTOM PROCEDURE TRAY) ×3 IMPLANT
PAD ARMBOARD 7.5X6 YLW CONV (MISCELLANEOUS) ×6 IMPLANT
SUT MON AB 4-0 PC3 18 (SUTURE) ×3 IMPLANT
SUT VIC AB 3-0 SH 27 (SUTURE) ×3
SUT VIC AB 3-0 SH 27X BRD (SUTURE) ×1 IMPLANT
SYR CONTROL 10ML LL (SYRINGE) ×3 IMPLANT
TOWEL GREEN STERILE (TOWEL DISPOSABLE) ×3 IMPLANT
TOWEL GREEN STERILE FF (TOWEL DISPOSABLE) ×1 IMPLANT

## 2020-08-31 NOTE — Transfer of Care (Signed)
Immediate Anesthesia Transfer of Care Note  Patient: [name] Foster  Procedure(s) Performed: REMOVAL PORT-A-CATH (N/A )  Patient Location: PACU  Anesthesia Type:MAC  Level of Consciousness: awake and patient cooperative  Airway & Oxygen Therapy: Patient Spontanous Breathing  Post-op Assessment: Report given to RN and Post -op Vital signs reviewed and stable  Post vital signs: Reviewed and stable  Last Vitals:  Vitals Value Taken Time  BP    Temp    Pulse 63 08/31/20 0825  Resp 11 08/31/20 0825  SpO2 99 % 08/31/20 0825  Vitals shown include unvalidated device data.  Last Pain:  Vitals:   08/31/20 0612  TempSrc: Temporal  PainSc: 0-No pain      Patients Stated Pain Goal: 3 (48/27/07 8675)  Complications: No complications documented.

## 2020-08-31 NOTE — Anesthesia Postprocedure Evaluation (Signed)
Anesthesia Post Note  Patient: [name] Foster  Procedure(s) Performed: REMOVAL PORT-A-CATH (N/A )     Patient location during evaluation: Phase II Anesthesia Type: MAC Level of consciousness: awake and alert, patient cooperative and oriented Pain management: pain level controlled Vital Signs Assessment: post-procedure vital signs reviewed and stable Respiratory status: spontaneous breathing, nonlabored ventilation and respiratory function stable Cardiovascular status: blood pressure returned to baseline and stable Postop Assessment: no apparent nausea or vomiting, adequate PO intake and able to ambulate Anesthetic complications: no   No complications documented.  Last Vitals:  Vitals:   08/31/20 0845 08/31/20 0910  BP: (!) 148/71 121/73  Pulse: (!) 59 (!) 59  Resp: 11 16  Temp: 36.5 C 36.5 C  SpO2: 100% 100%    Last Pain:  Vitals:   08/31/20 0910  TempSrc:   PainSc: 0-No pain                 Javaeh Muscatello,E. Jp Eastham

## 2020-08-31 NOTE — Op Note (Signed)
  PRE-OPERATIVE DIAGNOSIS:  un-needed Port-A-Cath for left breast cancer  POST-OPERATIVE DIAGNOSIS:  Same   PROCEDURE:  Procedure(s):  REMOVAL PORT-A-CATH  SURGEON:  Surgeon(s):  Wai Minotti, MD  ANESTHESIA:   MAC + local  EBL:   Minimal  SPECIMEN:  None  Complications : none known  Procedure:   Pt was  identified in the holding area and taken to the operating room where she was placed supine on the operating room table.  MAC anesthesia was induced.  The left upper chest was prepped and draped.  The prior incision was anesthetized with local anesthetic.  The incision was opened with a #15 blade.  The subcutaneous tissue was divided with the cautery.  The port was identified and the capsule opened.  The four 2-0 prolene sutures were removed.  The port was then removed and pressure held on the tract.  The catheter appeared intact without evidence of breakage, length was 22 cm.  The wound was inspected for hemostasis, which was achieved with cautery.  The wound was closed with 3-0 vicryl deep dermal interrupted sutures and 4-0 Monocryl running subcuticular suture.  The wound was cleaned, dried, and dressed with dermabond.  The patient was awakened from anesthesia and taken to the PACU in stable condition.  Needle, sponge, and instrument counts are correct.     

## 2020-09-01 ENCOUNTER — Encounter (HOSPITAL_COMMUNITY): Payer: Self-pay | Admitting: General Surgery

## 2020-09-01 NOTE — Telephone Encounter (Signed)
Pt called in and make an appt with Dr Stanford Breed on 12/13 at 9:40 but need enough med to make it to this appt

## 2020-09-01 NOTE — Progress Notes (Signed)
HPI: FU nonischemic cardiomyopathy andaortic insufficiency. Previous catheterization in 2004 showed no obstructive coronary disease. CT of her chest in Nov 2008 showed no thoracic aneurysm. Previous Holter monitor secondary to "dizzy spells" showed PACs and PVCs. Nuclear study April 2014 showed an ejection fraction of 62% and normal perfusion. Patient underwent transesophageal echocardiogram in May of 2014. Her ejection fraction was 50-55%. There was moderate aortic insufficiency and mild mitral regurgitation. There was a linear density associated with the atrial septum of uncertain etiology. Last echocardiogram August 2021 showed ejection fraction 50 to 20%, grade 2 diastolic dysfunction, mild mitral regurgitation, moderate aortic insufficiency. Patient has had occasional noncompliance in the past with medications. If blood pressure not controlled LV function noted to be reduced with worsening aortic insufficiency. Her ACE inhibitor was discontinued previously because of dehydration/renal insufficiency. Since I last saw her,she has been treated for breast cancer.  She has some residual fatigue and dyspnea that appears to be improving as she is removed from her chemotherapy and radiation.  No orthopnea or PND.  Minimal pedal edema.  No chest pain or syncope.  Current Outpatient Medications  Medication Sig Dispense Refill   Albuterol Sulfate (PROAIR RESPICLICK) 947 (90 Base) MCG/ACT AEPB Inhale 2 puffs into the lungs every 6 (six) hours as needed. (Patient taking differently: Inhale 2 puffs into the lungs every 6 (six) hours as needed (SOB / wheezing).) 2 each 1   amLODipine (NORVASC) 10 MG tablet TAKE 1 TABLET BY MOUTH  DAILY (Patient taking differently: Take 10 mg by mouth daily.) 90 tablet 3   atorvastatin (LIPITOR) 20 MG tablet TAKE 1 TABLET BY MOUTH  DAILY (Patient taking differently: Take 20 mg by mouth at bedtime.) 90 tablet 3   Carboxymethylcellul-Glycerin (CLEAR EYES FOR DRY EYES)  1-0.25 % SOLN Place 1 drop into both eyes 3 (three) times daily as needed (dry eyes).      Cholecalciferol (VITAMIN D) 50 MCG (2000 UT) tablet Take 2,000 Units by mouth daily.     fexofenadine (ALLEGRA) 180 MG tablet Take 180 mg by mouth daily as needed for allergies.      fluticasone (FLONASE) 50 MCG/ACT nasal spray USE 2 SPRAYS IN EACH NOSTRIL DAILY. PT NEEDS TO SCHEDULE A FOLLOW UP APPT BEFORE NEXT REFILL. (Patient taking differently: Place 2 sprays into both nostrils daily.) 16 g 0   furosemide (LASIX) 40 MG tablet TAKE 1 TABLET BY MOUTH  DAILY 90 tablet 3   hydrALAZINE (APRESOLINE) 50 MG tablet Take 0.5 tablets (25 mg total) by mouth 3 (three) times daily. (Patient taking differently: Take 50 mg by mouth 3 (three) times daily.) 1 tablet 0   methocarbamol (ROBAXIN) 500 MG tablet Take 1 tablet (500 mg total) by mouth every 6 (six) hours as needed for muscle spasms. 20 tablet 1   metoprolol tartrate (LOPRESSOR) 50 MG tablet TAKE 1 TABLET BY MOUTH  TWICE DAILY (Patient taking differently: Take 50 mg by mouth in the morning and at bedtime.) 180 tablet 3   oxyCODONE (OXY IR/ROXICODONE) 5 MG immediate release tablet Take 0.5-1 tablets (2.5-5 mg total) by mouth every 6 (six) hours as needed for severe pain. 8 tablet 0   potassium chloride SA (KLOR-CON) 20 MEQ tablet Take 20 mEq by mouth daily.     sodium chloride (OCEAN) 0.65 % SOLN nasal spray Place 1 spray into both nostrils as needed for congestion.     traMADol (ULTRAM) 50 MG tablet Take 1 tablet (50 mg total) by mouth every  6 (six) hours as needed (mild pain). 20 tablet 0   warfarin (COUMADIN) 5 MG tablet TAKE 1 TABLET BY MOUTH  DAILY OR AS DIRECTED BY  ANTICOAGULATION CLINIC (Patient taking differently: Take 5 mg by mouth at bedtime. TAKE 1 TABLET BY MOUTH  DAILY OR AS DIRECTED BY  ANTICOAGULATION CLINIC) 100 tablet 3   Wound Dressings (SONAFINE EX) Apply 1 application topically in the morning and at bedtime.     No current  facility-administered medications for this visit.     Past Medical History:  Diagnosis Date   Allergic rhinitis    Anemia    Aortic valve disorders    Arthritis    Asthma    Cancer ([place])    Breast Cancer   CHF (congestive heart failure) ([place])    Coronary artery disease    Family history of adverse reaction to anesthesia    difficulty waking mother up after surgery   Family history of breast cancer    Family history of cervical cancer    Family history of colon cancer    Family history of throat cancer    Heart murmur    Hyperlipidemia    Hypertension    Long term (current) use of anticoagulants    Other primary cardiomyopathies    Peripheral vascular disease ([place])    right leg   Personal history of colonic polyps    Personal history of venous thrombosis and embolism     Past Surgical History:  Procedure Laterality Date   ABDOMINAL HYSTERECTOMY     BREAST BIOPSY Left 07/2019   CARDIAC CATHETERIZATION  2004   COLON SURGERY     colonoscopy   DILATION AND CURETTAGE OF UTERUS     several   MASTECTOMY MODIFIED RADICAL Left 03/09/2020   Left Modified Radical Mastectomy (left mastectomy with axillary lymph node dissection)    MASTECTOMY MODIFIED RADICAL Left 03/09/2020   Procedure: LEFT MODIFIED RADICAL MASTECTOMY;  Surgeon: Stark Klein, MD;  Location: [facility];  Service: General;  Laterality: Left;  GEN AND PECTORAL BLOCK   PORT-A-CATH REMOVAL N/A 08/31/2020   Procedure: REMOVAL PORT-A-CATH;  Surgeon: Stark Klein, MD;  Location: [facility];  Service: General;  Laterality: N/A;   PORTACATH PLACEMENT Left 08/18/2019   Procedure: INSERTION PORT-A-CATH;  Surgeon: Stark Klein, MD;  Location: [facility];  Service: General;  Laterality: Left;   Rt knee arthoscopic     TEE WITHOUT CARDIOVERSION N/A 02/21/2013   Procedure: TRANSESOPHAGEAL ECHOCARDIOGRAM (TEE);  Surgeon: Lelon Perla, MD;  Location: [facility] ENDOSCOPY;  Service: Cardiovascular;  Laterality: N/A;     Social History   Socioeconomic History   Marital status: Legally Separated    Spouse name: Not on file   Number of children: Not on file   Years of education: Not on file   Highest education level: Not on file  Occupational History   Not on file  Tobacco Use   Smoking status: Former Smoker    Packs/day: 0.25    Types: Cigarettes    Quit date: 07/02/2019    Years since quitting: 1.2   Smokeless tobacco: Never Used  Vaping Use   Vaping Use: Never used  Substance and Sexual Activity   Alcohol use: Yes    Comment: rare occasion   Drug use: No   Sexual activity: Not on file    Comment: Hysterectomy  Other Topics Concern   Not on file  Social History Narrative   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  Divorced   Regular exercise- no   5 hours sleep    Lives with a son age 16    No pets         Social Determinants of Radio broadcast assistant Strain: Not on file  Food Insecurity: Not on file  Transportation Needs: Not on file  Physical Activity: Not on file  Stress: Not on file  Social Connections: Not on file  Intimate Partner Violence: Not on file    Family History  Problem Relation Age of Onset   Alcohol abuse Other    Depression Other    Hyperlipidemia Other    Hypertension Other    Kidney disease Other    Colon cancer Mother        dx late 27s   Cervical cancer Maternal Grandmother    Stroke Paternal Grandmother    Breast cancer Maternal Aunt        dx 48s   Breast cancer Cousin        dx 2s    ROS: no fevers or chills, productive cough, hemoptysis, dysphasia, odynophagia, melena, hematochezia, dysuria, hematuria, rash, seizure activity, orthopnea, PND, pedal edema, claudication. Remaining systems are negative.  Physical Exam: Well-developed well-nourished in no acute distress.  Skin is warm and dry.  HEENT is normal.  Neck is supple.  Chest is clear to auscultation with normal expansion.  Cardiovascular exam  is regular rate and rhythm.  2/6 systolic murmur left sternal border. Abdominal exam nontender or distended. No masses palpated. Extremities show no edema. neuro grossly intact  ECG-normal sinus rhythm at a rate of 66, left ventricular hypertrophy, nonspecific ST changes.  Personally reviewed  A/P  1 aortic insufficiency-moderate on most recent echocardiogram.  She will need follow-up echocardiogram August 2022.  Note her aortic insufficiency has appeared worse in the past and she has been noncompliant with her medications.  2 nonischemic cardiomyopathy-LV function low normal on most recent echocardiogram.  As outlined above her LV function has deteriorated and she has not been compliant with her medications in the past.  3 hypertension-patient's blood pressure is borderline.  However she follows this at home and it is controlled.  We will consider increasing hydralazine in the future if needed.  4 hyperlipidemia-continue statin.  5 prior DVT-Per primary care.  Kirk Ruths, MD

## 2020-09-07 ENCOUNTER — Ambulatory Visit: Payer: Medicare Other | Admitting: Rehabilitation

## 2020-09-13 ENCOUNTER — Encounter: Payer: Self-pay | Admitting: Cardiology

## 2020-09-13 ENCOUNTER — Other Ambulatory Visit: Payer: Self-pay

## 2020-09-13 ENCOUNTER — Ambulatory Visit (INDEPENDENT_AMBULATORY_CARE_PROVIDER_SITE_OTHER): Payer: Medicare Other | Admitting: Cardiology

## 2020-09-13 VITALS — BP 136/78 | HR 66 | Ht 66.0 in | Wt 204.0 lb

## 2020-09-13 DIAGNOSIS — I351 Nonrheumatic aortic (valve) insufficiency: Secondary | ICD-10-CM | POA: Diagnosis not present

## 2020-09-13 DIAGNOSIS — I43 Cardiomyopathy in diseases classified elsewhere: Secondary | ICD-10-CM

## 2020-09-13 DIAGNOSIS — I119 Hypertensive heart disease without heart failure: Secondary | ICD-10-CM

## 2020-09-13 DIAGNOSIS — I10 Essential (primary) hypertension: Secondary | ICD-10-CM | POA: Diagnosis not present

## 2020-09-13 MED ORDER — HYDRALAZINE HCL 50 MG PO TABS
25.0000 mg | ORAL_TABLET | Freq: Three times a day (TID) | ORAL | 3 refills | Status: DC
Start: 2020-09-13 — End: 2020-09-16

## 2020-09-13 MED ORDER — AMLODIPINE BESYLATE 10 MG PO TABS
10.0000 mg | ORAL_TABLET | Freq: Every day | ORAL | 3 refills | Status: DC
Start: 2020-09-13 — End: 2021-08-22

## 2020-09-13 MED ORDER — AMLODIPINE BESYLATE 10 MG PO TABS
10.0000 mg | ORAL_TABLET | Freq: Every day | ORAL | 0 refills | Status: DC
Start: 2020-09-13 — End: 2020-09-13

## 2020-09-13 NOTE — Patient Instructions (Signed)
  Testing/Procedures:  Your physician has requested that you have an echocardiogram. Echocardiography is a painless test that uses sound waves to create images of your heart. It provides your doctor with information about the size and shape of your heart and how well your heart's chambers and valves are working. This procedure takes approximately one hour. There are no restrictions for this procedure.[name] Foster     Follow-Up: At [facility], you and your health needs are our priority.  As part of our continuing mission to provide you with exceptional heart care, we have created designated Provider Care Teams.  These Care Teams include your primary Cardiologist (physician) and Advanced Practice Providers (APPs -  Physician Assistants and Nurse Practitioners) who all work together to provide you with the care you need, when you need it.  We recommend signing up for the patient portal called "MyChart".  Sign up information is provided on this After Visit Summary.  MyChart is used to connect with patients for Virtual Visits (Telemedicine).  Patients are able to view lab/test results, encounter notes, upcoming appointments, etc.  Non-urgent messages can be sent to your provider as well.   To learn more about what you can do with MyChart, go to [email or web address].    Your next appointment:   8 month(s) AFTER ECHO COMPLETED  The format for your next appointment:   In Person  Provider:   Kirk Ruths, MD

## 2020-09-13 NOTE — Addendum Note (Signed)
Addended by: Cristopher Estimable on: 09/13/2020 10:19 AM   Modules accepted: Orders

## 2020-09-14 ENCOUNTER — Ambulatory Visit: Payer: Medicare Other | Attending: Hematology and Oncology | Admitting: Rehabilitation

## 2020-09-14 ENCOUNTER — Encounter: Payer: Self-pay | Admitting: Rehabilitation

## 2020-09-14 DIAGNOSIS — M25612 Stiffness of left shoulder, not elsewhere classified: Secondary | ICD-10-CM | POA: Insufficient documentation

## 2020-09-14 DIAGNOSIS — R293 Abnormal posture: Secondary | ICD-10-CM | POA: Diagnosis not present

## 2020-09-14 DIAGNOSIS — C50812 Malignant neoplasm of overlapping sites of left female breast: Secondary | ICD-10-CM | POA: Insufficient documentation

## 2020-09-14 DIAGNOSIS — I890 Lymphedema, not elsewhere classified: Secondary | ICD-10-CM | POA: Insufficient documentation

## 2020-09-14 DIAGNOSIS — Z171 Estrogen receptor negative status [ER-]: Secondary | ICD-10-CM | POA: Insufficient documentation

## 2020-09-14 NOTE — Therapy (Signed)
[facility] [address] [place], [place], [address] Phone: [phone]   Fax:  [phone]  Physical Therapy Treatment  Patient Details  Name: [name] Foster MRN: [id number] Date of Birth: [birth year] Referring Provider (PT): Dr. Pamelia Hoit   Encounter Date: 09/14/2020   PT End of Session - 09/14/20 1200    Visit Number 21    Number of Visits 32    Date for PT Re-Evaluation 10/26/20    PT Start Time 1118    PT Stop Time 1201    PT Time Calculation (min) 43 min    Activity Tolerance Patient tolerated treatment well    Behavior During Therapy [facility] for tasks assessed/performed           Past Medical History:  Diagnosis Date  . Allergic rhinitis   . Anemia   . Aortic valve disorders   . Arthritis   . Asthma   . Cancer [facility])    Breast Cancer  . CHF (congestive heart failure) ([facility])   . Coronary artery disease   . Family history of adverse reaction to anesthesia    difficulty waking mother up after surgery  . Family history of breast cancer   . Family history of cervical cancer   . Family history of colon cancer   . Family history of throat cancer   . Heart murmur   . Hyperlipidemia   . Hypertension   . Long term (current) use of anticoagulants   . Other primary cardiomyopathies   . Peripheral vascular disease ([facility])    right leg  . Personal history of colonic polyps   . Personal history of venous thrombosis and embolism     Past Surgical History:  Procedure Laterality Date  . ABDOMINAL HYSTERECTOMY    . BREAST BIOPSY Left 07/2019  . CARDIAC CATHETERIZATION  2004  . COLON SURGERY     colonoscopy  . DILATION AND CURETTAGE OF UTERUS     several  . MASTECTOMY MODIFIED RADICAL Left 03/09/2020   Left Modified Radical Mastectomy (left mastectomy with axillary lymph node dissection)   . MASTECTOMY MODIFIED RADICAL Left 03/09/2020   Procedure: LEFT MODIFIED RADICAL MASTECTOMY;  Surgeon: Almond Lint, MD;   [facility];  Service: General;  Laterality: Left;  GEN AND PECTORAL BLOCK  . PORT-A-CATH REMOVAL N/A 08/31/2020   Procedure: REMOVAL PORT-A-CATH;  Surgeon: Almond Lint, MD;  Location: [facility];  Service: General;  Laterality: N/A;  . PORTACATH PLACEMENT Left 08/18/2019   Procedure: INSERTION PORT-A-CATH;  Surgeon: Almond Lint, MD;  Location[facility];  Service: General;  Laterality: Left;  . Rt knee arthoscopic    . TEE WITHOUT CARDIOVERSION N/A 02/21/2013   Procedure: TRANSESOPHAGEAL ECHOCARDIOGRAM (TEE);  Surgeon: Lewayne Bunting, MD;  Location: [facility] ENDOSCOPY;  Service: Cardiovascular;  Laterality: N/A;    There were no vitals filed for this visit.   Subjective Assessment - 09/14/20 1118    Subjective I got my port out.  It maybe feels a little better since getting it out.  I have a funny feeling in the chest like numb.  The velcro is still holding up.    Pertinent History Patient was diagnosed on 07/24/2019 with left grade III invasive ductal carcinoma breast cancer. ER/PR negative and HER2 positive with a Ki67 of 70%. neoadjuvant chemotherapy completed.  Lt Mastectomy 03/09/20 with 19 axillary nodes removed and 2 inframammary nodes removed.  All negative.  Radiation consultation on 05/04/20.    Patient Stated Goals swelling down, more  mobility, get radiation started    Currently in Pain? No/denies              [facility] PT Assessment - 09/14/20 0001      AROM   Left Shoulder Flexion 104 Degrees   tight in axilla   Left Shoulder ABduction 105 Degrees   with pull in the axilla     PROM   Left Shoulder Flexion 125 Degrees    Left Shoulder ABduction 153 Degrees             LYMPHEDEMA/ONCOLOGY QUESTIONNAIRE - 09/14/20 0001      Left Upper Extremity Lymphedema   15 cm Proximal to Olecranon Process 40 cm    10 cm Proximal to Olecranon Process 37.2 cm    Olecranon Process 30.8 cm    15 cm Proximal to Ulnar Styloid Process 30 cm    10 cm Proximal to Ulnar Styloid Process 27 cm     Just Proximal to Ulnar Styloid Process 19.5 cm    Across Hand at [employer] 20.1 cm    At [place] of 2nd Digit 6.3 cm              Flowsheet Row Outpatient Re[facility] from [facility]  Lymphedema Life Impact Scale Total Score 38.24 %            [facility] Adult PT Treatment/Exercise - 09/14/20 0001      Manual Therapy   Myofascial Release To Lt axilla during P/ROM    Manual Lymphatic Drainage (MLD)  In Supine: sternal nodes, Lt inguinal nodes, Lt axillary region and then Lt UE working from lateral upper arm to dorsum of hand then retracing all steps with focus on forearm both sides.      Passive ROM to the left shoulder into flexion, abduction, ER at various angles and D2 to tolerance                       PT Long Term Goals - 09/14/20 1205      PT LONG TERM GOAL #1   Title Pt will improve her Lt shoulder active flexion and abduction to at least 140 to improve overhead reaching    Baseline 50 / 60, 104/105 on 09/14/20    Status On-going      PT LONG TERM GOAL #2   Title Pt will decrease circumferential measurements by at least 2cm globally proximal to the wrist to demonstrate improved Lt UE edema      PT LONG TERM GOAL #3   Title Pt will obtain appropriate compression garments for the Lt UE to manage lymphedema    Status Achieved      PT LONG TERM GOAL #4   Title Pt will be ind with final HEP    Status On-going                 Plan - 09/14/20 1202    Clinical Impression Statement Pt returns 2 weeks post port removal with decreased AROM of the Lt shoulder, continued chronic lymphedema of the LT UE but controlled by self care with sleeves and self MLD.  Will extend POC to return to prior level of AROM most likely 1x per week x 4 weeks.    PT Frequency 1x / week    PT Duration 4 weeks    PT Treatment/Interventions ADLs/Self Care Home Management;Therapeutic exercise;Patient/family education;Moist  Heat;Therapeutic activities;Manual techniques;Joint Manipulations;Passive range of motion;Cryotherapy;Manual lymph drainage;Compression bandaging;Taping  PT Next Visit Plan Lt shoulder PROM, AAROM and Lt UE MLD, show pt how to order new cover sleeve for circaid    Consulted and Agree with Plan of Care Patient           Patient will benefit from skilled therapeutic intervention in order to improve the following deficits and impairments:     Visit Diagnosis: Stiffness of left shoulder, not elsewhere classified  Abnormal posture  Lymphedema  Malignant neoplasm of overlapping sites of left breast in female, estrogen receptor negative ([place])     Problem List Patient Active Problem List   Diagnosis Date Noted  . Breast cancer metastasized to axillary lymph node, left ([place]) 03/09/2020  . Coagulopathy ([place]) 12/05/2019  . Epistaxis, recurrent 12/05/2019  . Dehydration   . Non-intractable vomiting   . Intractable nausea and vomiting 09/21/2019  . Generalized weakness 09/21/2019  . AKI (acute kidney injury) ([place]) 09/21/2019  . Leukocytosis 09/21/2019  . Hypernatremia 09/21/2019  . Hyperchloremia 09/21/2019  . Thrombocytopenia ([place]) 09/21/2019  . Port-A-Cath in place 09/08/2019  . Family history of breast cancer   . Family history of throat cancer   . Family history of cervical cancer   . Malignant neoplasm of overlapping sites of left breast in female, estrogen receptor negative ([place]) 08/01/2019  . Family history of colon cancer mom 11/05/2018  . Encounter for therapeutic drug monitoring 11/21/2013  . Asthma with bronchitis 09/01/2013  . Chest pain 01/01/2013  . Aortic insufficiency 10/25/2011  . Chronic anticoagulation 09/29/2011  . Nocturnal dyspnea 09/29/2011  . VITAMIN D DEFICIENCY 08/06/2009  . DVT 01/26/2009  . KNEE PAIN, RIGHT 06/19/2008  . EDEMA 06/19/2008  . ANKLE PAIN, LEFT 02/13/2008  . HYPERKALEMIA 08/20/2007  . HYPOKALEMIA 08/20/2007  . HLD (hyperlipidemia)  07/18/2007  . Cardiomyopathy, hypertensive ([place]) 07/18/2007  . Essential hypertension 06/26/2007  . Hypertensive heart disease with heart failure ([place]) 06/26/2007  . Congestive heart failure ([place]) 06/26/2007  . Allergic rhinitis, cause unspecified 06/26/2007  . ASTHMA 06/26/2007  . SYMPTOM, SYNCOPE AND COLLAPSE 06/26/2007  . HEADACHE 06/26/2007  . HEART MURMUR, HX OF 06/26/2007  . DVT, HX OF 06/26/2007    Stark Bray 09/14/2020, 12:54 PM  [place], [place], [address] Phone: [phone]   Fax:  [phone]  Name: [name] Foster MRN: [id number] Date of Birth: 1950/11/05

## 2020-09-16 ENCOUNTER — Telehealth: Payer: Self-pay | Admitting: Cardiology

## 2020-09-16 MED ORDER — HYDRALAZINE HCL 50 MG PO TABS
25.0000 mg | ORAL_TABLET | Freq: Three times a day (TID) | ORAL | 3 refills | Status: DC
Start: 2020-09-16 — End: 2021-08-22

## 2020-09-16 NOTE — Telephone Encounter (Signed)
*  STAT* If patient is at the pharmacy, call can be transferred to refill team.   1. Which medications need to be refilled? (please list name of each medication and dose if known)  hydrALAZINE (APRESOLINE) 50 MG tablet    2. Which pharmacy/location (including street and city if local pharmacy) is medication to be sent to? [facility], [address], [address]  3. Do they need a 30 day or 90 day supply? 90  Patient has one week of medication left, prescription was sent to wrong pharmacy.

## 2020-09-20 ENCOUNTER — Encounter: Payer: Self-pay | Admitting: Hematology and Oncology

## 2020-09-20 ENCOUNTER — Telehealth: Payer: Self-pay | Admitting: Cardiology

## 2020-09-20 NOTE — Telephone Encounter (Signed)
Follow Up:    Pt said she have not received her refill on Hydralazine, she needs this asap please.  Refill had not printed.

## 2020-09-21 NOTE — Telephone Encounter (Signed)
Spoke with [facility] and verified the hydralazine refill and called patient to let her know the medication was refilled.

## 2020-09-22 ENCOUNTER — Other Ambulatory Visit: Payer: Self-pay | Admitting: *Deleted

## 2020-09-23 ENCOUNTER — Ambulatory Visit
Admission: RE | Admit: 2020-09-23 | Discharge: 2020-09-23 | Disposition: A | Discharge status: Home / Self Care | Payer: Medicare Other | Source: Ambulatory Visit | Attending: Hematology and Oncology | Admitting: Hematology and Oncology

## 2020-09-23 ENCOUNTER — Other Ambulatory Visit: Payer: Self-pay

## 2020-09-23 DIAGNOSIS — Z171 Estrogen receptor negative status [ER-]: Secondary | ICD-10-CM

## 2020-09-23 DIAGNOSIS — Z1231 Encounter for screening mammogram for malignant neoplasm of breast: Secondary | ICD-10-CM | POA: Diagnosis not present

## 2020-09-23 IMAGING — MG DIGITAL SCREENING UNILAT RIGHT W/ TOMO W/ CAD
6 series · 6 of 18 positions shown · non-contrast
Comparison: Previous exam(s).

CLINICAL DATA: Screening.

EXAM:
DIGITAL SCREENING UNILATERAL RIGHT MAMMOGRAM WITH CAD AND TOMO

[R CC synth-2D (1 of 2)]
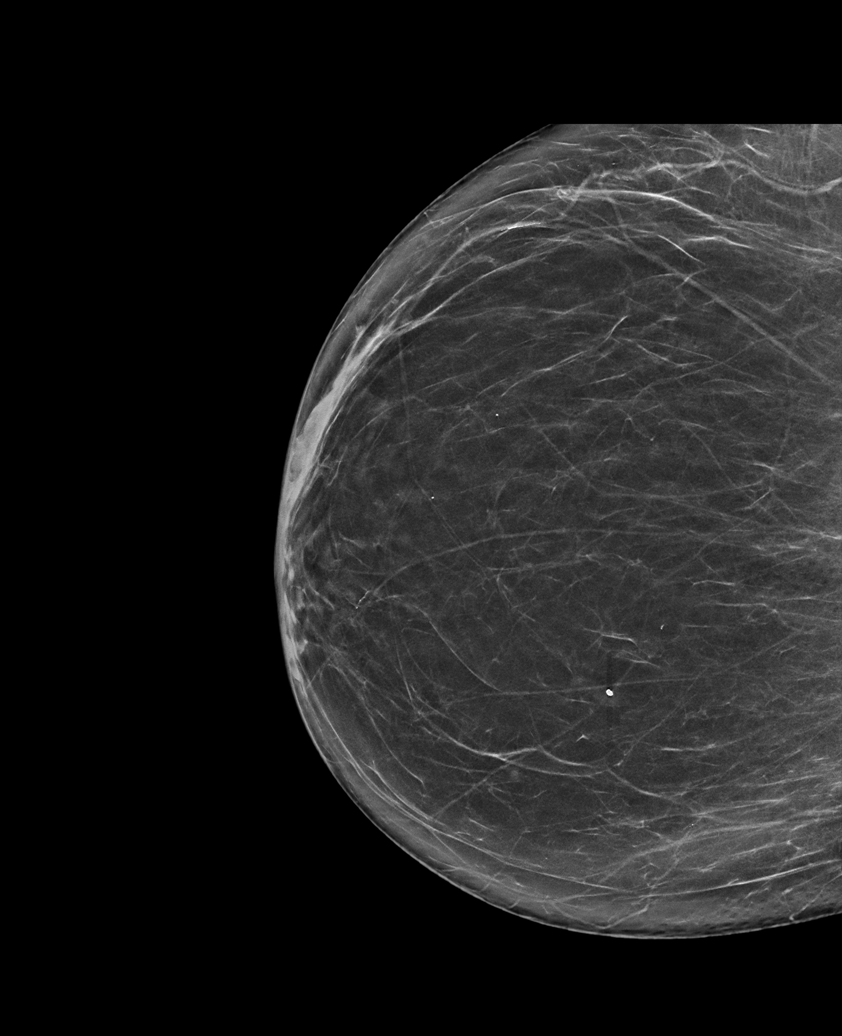

[R CC synth-2D (2 of 2)]
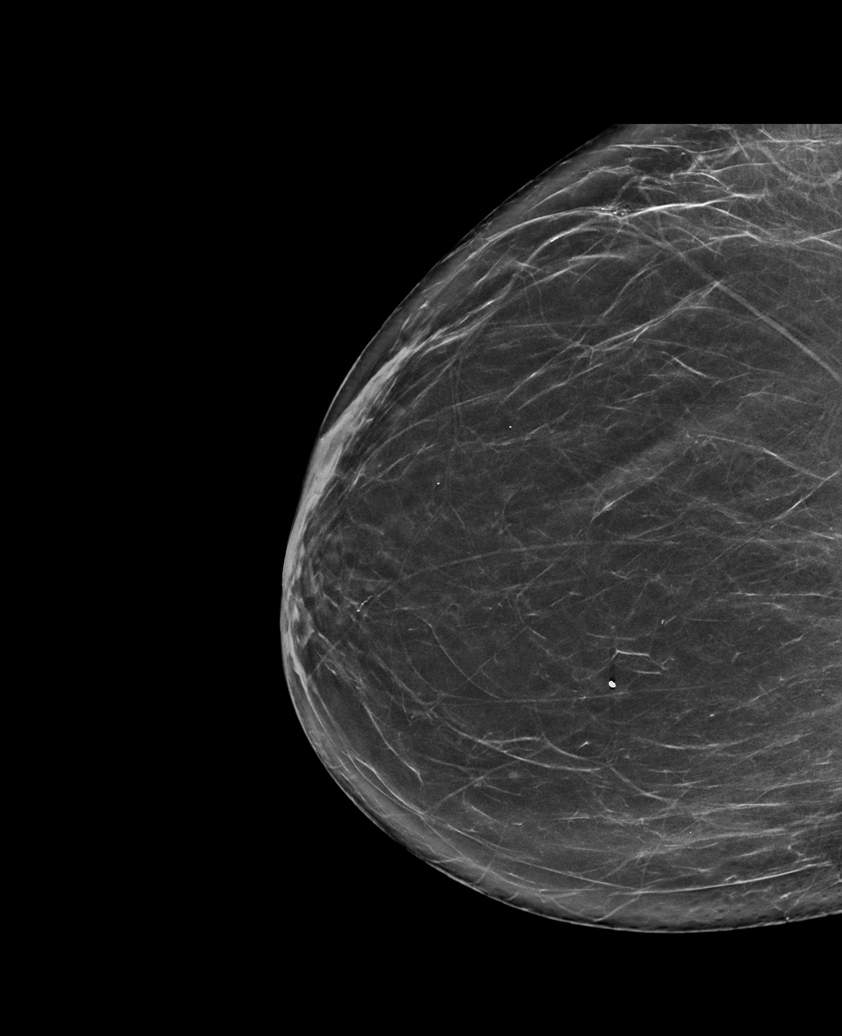

[R MLO synth-2D]
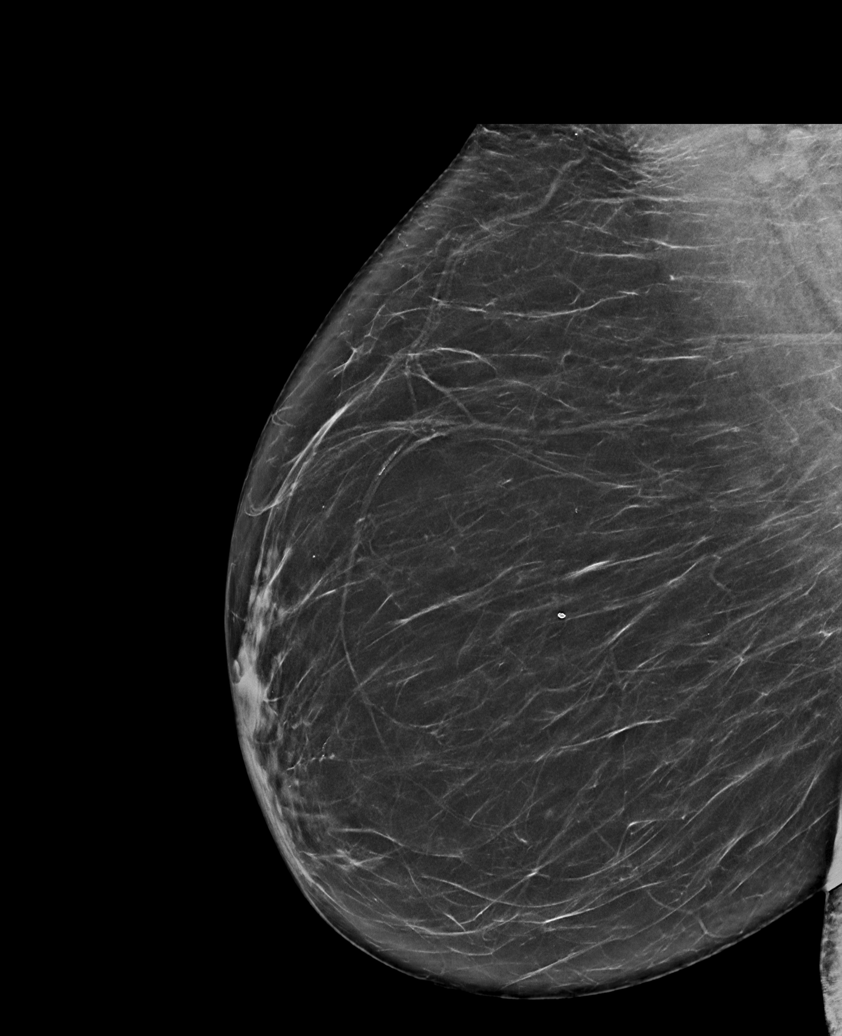

[R MLO tomo · tomo slice 39/78.0]
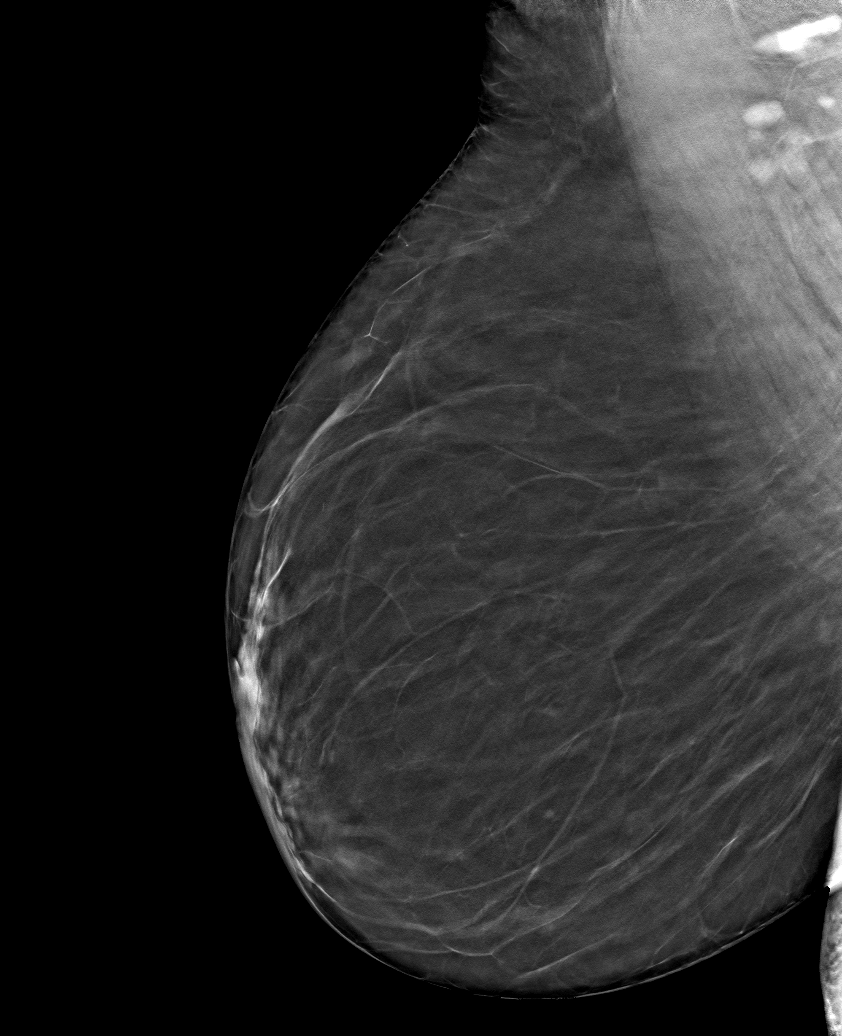

[R CC tomo (1 of 2) · tomo slice 35/70.0]
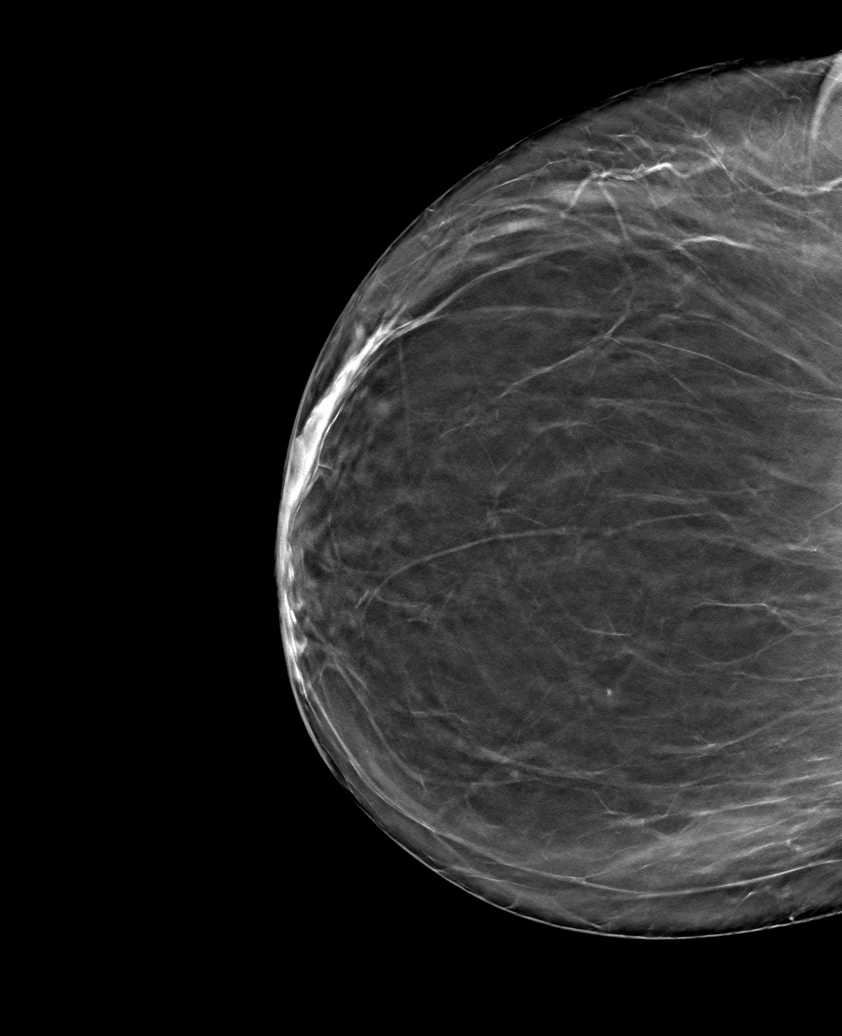

[R CC tomo (2 of 2) · tomo slice 37/72.0]
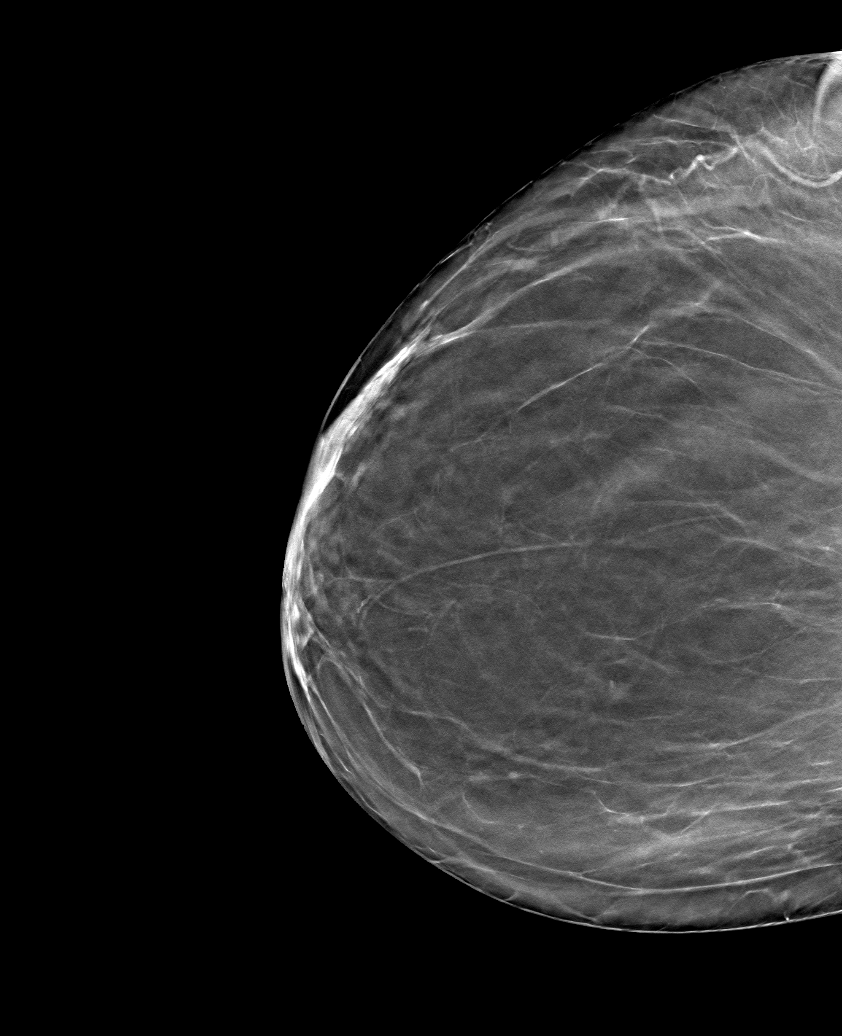

[6 of 18 positions shown; findings below may reference images not displayed]

ACR Breast Density Category b: There are scattered areas of
fibroglandular density.
FINDINGS: The patient has had a left mastectomy. There are no findings
suspicious for malignancy. Images were processed with CAD.
IMPRESSION: No mammographic evidence of malignancy. A result letter of this
screening mammogram will be mailed directly to the patient.

RECOMMENDATION:
Screening mammogram in one year.  (Code:[FD])

BI-RADS CATEGORY  1: Negative.

## 2020-10-04 ENCOUNTER — Ambulatory Visit: Payer: Medicare Other | Admitting: Podiatry

## 2020-10-05 ENCOUNTER — Encounter: Payer: Medicare Other | Admitting: Rehabilitation

## 2020-10-12 ENCOUNTER — Other Ambulatory Visit: Payer: Self-pay

## 2020-10-12 ENCOUNTER — Encounter: Payer: Self-pay | Admitting: Rehabilitation

## 2020-10-12 ENCOUNTER — Ambulatory Visit: Payer: Medicare Other | Attending: Hematology and Oncology | Admitting: Rehabilitation

## 2020-10-12 DIAGNOSIS — Z171 Estrogen receptor negative status [ER-]: Secondary | ICD-10-CM | POA: Diagnosis not present

## 2020-10-12 DIAGNOSIS — I890 Lymphedema, not elsewhere classified: Secondary | ICD-10-CM | POA: Insufficient documentation

## 2020-10-12 DIAGNOSIS — M25612 Stiffness of left shoulder, not elsewhere classified: Secondary | ICD-10-CM | POA: Diagnosis not present

## 2020-10-12 DIAGNOSIS — R293 Abnormal posture: Secondary | ICD-10-CM | POA: Diagnosis not present

## 2020-10-12 DIAGNOSIS — C50812 Malignant neoplasm of overlapping sites of left female breast: Secondary | ICD-10-CM | POA: Insufficient documentation

## 2020-10-12 NOTE — Therapy (Signed)
[place], [place], [address] Phone: [phone]   Fax:  [phone]  Physical Therapy Treatment  Patient Details  Name: [name] Foster MRN: [id number] Date of Birth: 1951/04/05 Referring Provider (PT): Dr. Lindi Adie   Encounter Date: 10/12/2020   PT End of Session - 10/12/20 1110    Visit Number 22    Number of Visits 32    Date for PT Re-Evaluation 10/26/20    PT Start Time 1000    PT Stop Time 1056    PT Time Calculation (min) 56 min           Past Medical History:  Diagnosis Date  . Allergic rhinitis   . Anemia   . Aortic valve disorders   . Arthritis   . Asthma   . Cancer [facility])    Breast Cancer  . CHF (congestive heart failure) ([place])   . Coronary artery disease   . Family history of adverse reaction to anesthesia    difficulty waking mother up after surgery  . Family history of breast cancer   . Family history of cervical cancer   . Family history of colon cancer   . Family history of throat cancer   . Heart murmur   . Hyperlipidemia   . Hypertension   . Long term (current) use of anticoagulants   . Other primary cardiomyopathies   . Peripheral vascular disease (HCC)    right leg  . Personal history of colonic polyps   . Personal history of venous thrombosis and embolism     Past Surgical History:  Procedure Laterality Date  . ABDOMINAL HYSTERECTOMY    . BREAST BIOPSY Left 07/2019  . CARDIAC CATHETERIZATION  2004  . COLON SURGERY     colonoscopy  . DILATION AND CURETTAGE OF UTERUS     several  . MASTECTOMY MODIFIED RADICAL Left 03/09/2020   Left Modified Radical Mastectomy (left mastectomy with axillary lymph node dissection)   . MASTECTOMY MODIFIED RADICAL Left 03/09/2020   Procedure: LEFT MODIFIED RADICAL MASTECTOMY;  Surgeon: Stark Klein, MD;  Location: [place];  Service: General;  Laterality: Left;  GEN AND PECTORAL BLOCK  . PORT-A-CATH REMOVAL N/A 08/31/2020    Procedure: REMOVAL PORT-A-CATH;  Surgeon: Stark Klein, MD;  Location: [place];  Service: General;  Laterality: N/A;  . PORTACATH PLACEMENT Left 08/18/2019   Procedure: INSERTION PORT-A-CATH;  Surgeon: Stark Klein, MD;  Location: [place];  Service: General;  Laterality: Left;  . Rt knee arthoscopic    . TEE WITHOUT CARDIOVERSION N/A 02/21/2013   Procedure: TRANSESOPHAGEAL ECHOCARDIOGRAM (TEE);  Surgeon: Lelon Perla, MD;  Location: [facility] ENDOSCOPY;  Service: Cardiovascular;  Laterality: N/A;    There were no vitals filed for this visit.   Subjective Assessment - 10/12/20 0956    Subjective Nothing new.  Still feeling tight in the chest and the swelling comes and goes    Pertinent History Patient was diagnosed on 07/24/2019 with left grade III invasive ductal carcinoma breast cancer. ER/PR negative and HER2 positive with a Ki67 of 70%. neoadjuvant chemotherapy completed.  Lt Mastectomy 03/09/20 with 19 axillary nodes removed and 2 inframammary nodes removed.  All negative.  Radiation consultation on 05/04/20.    Currently in Pain? No/denies                     Flowsheet Row Outpatient [facility]  Lymphedema Life Impact Scale Total Score 38.24 %  [facility] Adult PT Treatment/Exercise - 10/12/20 0001      Shoulder Exercises: Supine   Flexion AAROM;Both;10 reps    Flexion Limitations with dowel      Shoulder Exercises: Sidelying   External Rotation AROM;10 reps    External Rotation Limitations with scapular assistance by PT    ABduction Left;5 reps    ABduction Limitations with scapular assist by PT      Shoulder Exercises: Pulleys   Flexion 2 minutes    Flexion Limitations with reminder for performance    Scaption 2 minutes    Scaption Limitations with reminder for performance      Shoulder Exercises: Therapy Ball   Flexion Both;5 reps    ABduction Limitations attempted but too difficult swithced to  dowel rod abduction in front of the mirror x 10 getting to 70 degrees      Shoulder Exercises: Stretch   Wall Stretch - ABduction 2 reps;20 seconds    Wall Stretch - ABduction Limitations wall chest stretch      Manual Therapy   Manual therapy comments gave pt donated compession tshirt which pt reports makes the lateral chest feel better    Manual Lymphatic Drainage (MLD) briefly posterior interaxillary work in sidelying    Passive ROM to the left shoulder into flexion, abduction, ER at various angles and D2 to tolerance                       PT Long Term Goals - 09/14/20 1205      PT LONG TERM GOAL #1   Title Pt will improve her Lt shoulder active flexion and abduction to at least 140 to improve overhead reaching    Baseline 50 / 60, 104/105 on 09/14/20    Status On-going      PT LONG TERM GOAL #2   Title Pt will decrease circumferential measurements by at least 2cm globally proximal to the wrist to demonstrate improved Lt UE edema      PT LONG TERM GOAL #3   Title Pt will obtain appropriate compression garments for the Lt UE to manage lymphedema    Status Achieved      PT LONG TERM GOAL #4   Title Pt will be ind with final HEP    Status On-going                 Plan - 10/12/20 1111    Clinical Impression Statement Pt returns to focus on AROM and tightness in the chest.  Overall pt has decreased AROM into all directions especially abduction which seems to have worsened a bit since check 4 weeks ago.  Pt is also compensating with shoulder hike and has mild signs of adhesive capsulitis as restriction during PROM does not show chest wall tightness like it did previously more capsular.  Scapula moves well passively.    PT Frequency 1x / week    PT Duration 4 weeks    PT Treatment/Interventions ADLs/Self Care Home Management;Therapeutic exercise;Patient/family education;Moist Heat;Therapeutic activities;Manual techniques;Joint Manipulations;Passive range of  motion;Cryotherapy;Manual lymph drainage;Compression bandaging;Taping    PT Next Visit Plan Lt shoulder PROM, AAROM and Lt UE MLD, show pt how to order new cover sleeve for circaid,    PT Home Exercise Plan focusing on HEP given 08/03/20, new HEP 10/12/20    Consulted and Agree with Plan of Care Patient           Patient will benefit from skilled therapeutic intervention in order to  improve the following deficits and impairments:     Visit Diagnosis: Stiffness of left shoulder, not elsewhere classified  Abnormal posture  Lymphedema  Malignant neoplasm of overlapping sites of left breast in female, estrogen receptor negative [facility])     Problem List Patient Active Problem List   Diagnosis Date Noted  . Breast cancer metastasized to axillary lymph node, left ([place]) 03/09/2020  . Coagulopathy ([place]) 12/05/2019  . Epistaxis, recurrent 12/05/2019  . Dehydration   . Non-intractable vomiting   . Intractable nausea and vomiting 09/21/2019  . Generalized weakness 09/21/2019  . AKI (acute kidney injury) ([place]) 09/21/2019  . Leukocytosis 09/21/2019  . Hypernatremia 09/21/2019  . Hyperchloremia 09/21/2019  . Thrombocytopenia ([place]) 09/21/2019  . Port-A-Cath in place 09/08/2019  . Family history of breast cancer   . Family history of throat cancer   . Family history of cervical cancer   . Malignant neoplasm of overlapping sites of left breast in female, estrogen receptor negative ([place]) 08/01/2019  . Family history of colon cancer mom 11/05/2018  . Encounter for therapeutic drug monitoring 11/21/2013  . Asthma with bronchitis 09/01/2013  . Chest pain 01/01/2013  . Aortic insufficiency 10/25/2011  . Chronic anticoagulation 09/29/2011  . Nocturnal dyspnea 09/29/2011  . VITAMIN D DEFICIENCY 08/06/2009  . DVT 01/26/2009  . KNEE PAIN, RIGHT 06/19/2008  . EDEMA 06/19/2008  . ANKLE PAIN, LEFT 02/13/2008  . HYPERKALEMIA 08/20/2007  . HYPOKALEMIA 08/20/2007  . HLD (hyperlipidemia)  07/18/2007  . Cardiomyopathy, hypertensive ([place]) 07/18/2007  . Essential hypertension 06/26/2007  . Hypertensive heart disease with heart failure ([place]) 06/26/2007  . Congestive heart failure ([place]) 06/26/2007  . Allergic rhinitis, cause unspecified 06/26/2007  . ASTHMA 06/26/2007  . SYMPTOM, SYNCOPE AND COLLAPSE 06/26/2007  . HEADACHE 06/26/2007  . HEART MURMUR, HX OF 06/26/2007  . DVT, HX OF 06/26/2007    Stark Bray 10/12/2020, 11:14 AM  [place], [place], [address] Phone: [phone]   Fax:  [phone]  Name: [name] Foster MRN: [id number] Date of Birth: 1951-08-17

## 2020-10-12 NOTE — Patient Instructions (Signed)
Access Code: [email or web address] URL: [email or web address]: 01/11/2022Prepared by: Marcene Brawn TevisExercises  Supine Bilateral Shoulder Protraction - 1 x daily - 7 x weekly - 1 sets - 10 reps - 3 second hold  Sidelying Shoulder Horizontal Abduction - 1 x daily - 7 x weekly - 1-3 sets - 5 reps - 2-3 second hold  Sidelying Shoulder External Rotation - 1 x daily - 7 x weekly - 1-3 sets - 5 reps - 2-3 second hold  Standing Pec Stretch at Wall - 1 x daily - 7 x weekly - 1 sets - 2 reps - 20-30 seconds hold

## 2020-10-15 DIAGNOSIS — R6889 Other general symptoms and signs: Secondary | ICD-10-CM | POA: Diagnosis not present

## 2020-10-21 ENCOUNTER — Encounter: Payer: Self-pay | Admitting: Rehabilitation

## 2020-10-21 ENCOUNTER — Other Ambulatory Visit: Payer: Self-pay

## 2020-10-21 ENCOUNTER — Ambulatory Visit: Payer: Medicare Other | Admitting: Rehabilitation

## 2020-10-21 DIAGNOSIS — I890 Lymphedema, not elsewhere classified: Secondary | ICD-10-CM | POA: Diagnosis not present

## 2020-10-21 DIAGNOSIS — R293 Abnormal posture: Secondary | ICD-10-CM

## 2020-10-21 DIAGNOSIS — C50812 Malignant neoplasm of overlapping sites of left female breast: Secondary | ICD-10-CM | POA: Diagnosis not present

## 2020-10-21 DIAGNOSIS — M25612 Stiffness of left shoulder, not elsewhere classified: Secondary | ICD-10-CM

## 2020-10-21 DIAGNOSIS — Z171 Estrogen receptor negative status [ER-]: Secondary | ICD-10-CM

## 2020-10-21 NOTE — Patient Instructions (Signed)
Access Code: [email or web address]: [email or web address]: 01/20/2022Prepared by: Marcene Brawn TevisExercises  Supine Bilateral Shoulder Protraction - 1 x daily - 7 x weekly - 1 sets - 10 reps - 3 second hold  Sidelying Shoulder Horizontal Abduction - 1 x daily - 7 x weekly - 1-3 sets - 5 reps - 2-3 second hold  Sidelying Shoulder External Rotation - 1 x daily - 7 x weekly - 1-3 sets - 5 reps - 2-3 second hold  Standing Pec Stretch at Wall - 1 x daily - 7 x weekly - 1 sets - 2 reps - 20-30 seconds hold  Standing Row with Anchored Resistance - 1 x daily - 2-3 x weekly - 1-3 sets - 10 reps - 2-3 second hold  Standing Shoulder External Rotation with Resistance - 1 x daily - 2-3 x weekly - 1-3 sets - 10 reps - 2-3 second hold  Supine Shoulder Horizontal Abduction with Resistance - 1 x daily - 2-3 x weekly - 1-3 sets - 10 reps - 2-3 second hold

## 2020-10-21 NOTE — Therapy (Signed)
[place], [place], [address] Phone: [phone]   Fax:  [phone]  Physical Therapy Treatment  Patient Details  Name: [name] Foster MRN: [id number] Date of Birth: [birth year] Referring Provider (PT): Dr. Lindi Adie   Encounter Date: 10/21/2020   PT End of Session - 10/21/20 1036    Visit Number 23    Number of Visits 32    Date for PT Re-Evaluation 10/26/20    PT Start Time 0940    PT Stop Time 1035    PT Time Calculation (min) 55 min    Activity Tolerance Patient tolerated treatment well    Behavior During Therapy [facility] for tasks assessed/performed           Past Medical History:  Diagnosis Date  . Allergic rhinitis   . Anemia   . Aortic valve disorders   . Arthritis   . Asthma   . Cancer [facility])    Breast Cancer  . CHF (congestive heart failure) ([place])   . Coronary artery disease   . Family history of adverse reaction to anesthesia    difficulty waking mother up after surgery  . Family history of breast cancer   . Family history of cervical cancer   . Family history of colon cancer   . Family history of throat cancer   . Heart murmur   . Hyperlipidemia   . Hypertension   . Long term (current) use of anticoagulants   . Other primary cardiomyopathies   . Peripheral vascular disease (HCC)    right leg  . Personal history of colonic polyps   . Personal history of venous thrombosis and embolism     Past Surgical History:  Procedure Laterality Date  . ABDOMINAL HYSTERECTOMY    . BREAST BIOPSY Left 07/2019  . CARDIAC CATHETERIZATION  2004  . COLON SURGERY     colonoscopy  . DILATION AND CURETTAGE OF UTERUS     several  . MASTECTOMY MODIFIED RADICAL Left 03/09/2020   Left Modified Radical Mastectomy (left mastectomy with axillary lymph node dissection)   . MASTECTOMY MODIFIED RADICAL Left 03/09/2020   Procedure: LEFT MODIFIED RADICAL MASTECTOMY;  Surgeon: Stark Klein, MD;   Location: [place];  Service: General;  Laterality: Left;  GEN AND PECTORAL BLOCK  . PORT-A-CATH REMOVAL N/A 08/31/2020   Procedure: REMOVAL PORT-A-CATH;  Surgeon: Stark Klein, MD;  Location: [place];  Service: General;  Laterality: N/A;  . PORTACATH PLACEMENT Left 08/18/2019   Procedure: INSERTION PORT-A-CATH;  Surgeon: Stark Klein, MD;  Location: [place];  Service: General;  Laterality: Left;  . Rt knee arthoscopic    . TEE WITHOUT CARDIOVERSION N/A 02/21/2013   Procedure: TRANSESOPHAGEAL ECHOCARDIOGRAM (TEE);  Surgeon: Lelon Perla, MD;  Location: [facility] ENDOSCOPY;  Service: Cardiovascular;  Laterality: N/A;    There were no vitals filed for this visit.   Subjective Assessment - 10/21/20 0938    Subjective I saw Dr. Barry Dienes and she said that pocket place may be some lymphedema    Pertinent History Patient was diagnosed on 07/24/2019 with left grade III invasive ductal carcinoma breast cancer. ER/PR negative and HER2 positive with a Ki67 of 70%. neoadjuvant chemotherapy completed.  Lt Mastectomy 03/09/20 with 19 axillary nodes removed and 2 inframammary nodes removed.  All negative.  Radiation consultation on 05/04/20.    Patient Stated Goals swelling down, more mobility, get radiation started    Currently in Pain? No/denies  Flowsheet Row Outpatient Rehab from 04/22/2020 in [place]  Lymphedema Life Impact Scale Total Score 38.24 %            [facility] Adult PT Treatment/Exercise - 10/21/20 0001      Shoulder Exercises: Supine   Protraction Both;10 reps    Protraction Limitations pro/ret    Horizontal ABduction Both;10 reps    Theraband Level (Shoulder Horizontal ABduction) Level 1 (Yellow)    Horizontal ABduction Limitations left stopping when feeling tight in the range of motion    External Rotation Both;10 reps    Theraband Level (Shoulder External Rotation) Level 1 (Yellow)    Other Supine Exercises extension presses  6"x10      Shoulder Exercises: Standing   Row 10 reps    Theraband Level (Shoulder Row) Level 1 (Yellow)    Other Standing Exercises dowel flexion, abduction, extension x 10 each      Shoulder Exercises: Pulleys   Flexion 2 minutes    Scaption 2 minutes      Manual Therapy   Edema Management gave pt large foam rectangle for lateral trunk with compression shirt    Passive ROM to the left shoulder into flexion, abduction, ER at various angles and D2 to tolerance                       PT Long Term Goals - 09/14/20 1205      PT LONG TERM GOAL #1   Title Pt will improve her Lt shoulder active flexion and abduction to at least 140 to improve overhead reaching    Baseline 50 / 60, 104/105 on 09/14/20    Status On-going      PT LONG TERM GOAL #2   Title Pt will decrease circumferential measurements by at least 2cm globally proximal to the wrist to demonstrate improved Lt UE edema      PT LONG TERM GOAL #3   Title Pt will obtain appropriate compression garments for the Lt UE to manage lymphedema    Status Achieved      PT LONG TERM GOAL #4   Title Pt will be ind with final HEP    Status On-going                 Plan - 10/21/20 1037    Clinical Impression Statement Continued AAROM/PROM and added some new strengthening work today which was tolerated well without pain.  Pt continues with chronic Rt UE lymphedema, shoulder joint stiffness, and decreased ROM.    PT Frequency 1x / week    PT Duration 4 weeks    PT Treatment/Interventions ADLs/Self Care Home Management;Therapeutic exercise;Patient/family education;Moist Heat;Therapeutic activities;Manual techniques;Joint Manipulations;Passive range of motion;Cryotherapy;Manual lymph drainage;Compression bandaging;Taping    PT Next Visit Plan Lt shoulder PROM, AAROM and Lt UE MLD, scapular strengthening    Consulted and Agree with Plan of Care Patient           Patient will benefit from skilled therapeutic  intervention in order to improve the following deficits and impairments:     Visit Diagnosis: Stiffness of left shoulder, not elsewhere classified  Abnormal posture  Lymphedema  Malignant neoplasm of overlapping sites of left breast in female, estrogen receptor negative ([place])     Problem List Patient Active Problem List   Diagnosis Date Noted  . Breast cancer metastasized to axillary lymph node, left ([place]) 03/09/2020  . Coagulopathy ([place]) 12/05/2019  . Epistaxis, recurrent 12/05/2019  . Dehydration   .  Non-intractable vomiting   . Intractable nausea and vomiting 09/21/2019  . Generalized weakness 09/21/2019  . AKI (acute kidney injury) ([place]) 09/21/2019  . Leukocytosis 09/21/2019  . Hypernatremia 09/21/2019  . Hyperchloremia 09/21/2019  . Thrombocytopenia ([place]) 09/21/2019  . Port-A-Cath in place 09/08/2019  . Family history of breast cancer   . Family history of throat cancer   . Family history of cervical cancer   . Malignant neoplasm of overlapping sites of left breast in female, estrogen receptor negative ([place]) 08/01/2019  . Family history of colon cancer mom 11/05/2018  . Encounter for therapeutic drug monitoring 11/21/2013  . Asthma with bronchitis 09/01/2013  . Chest pain 01/01/2013  . Aortic insufficiency 10/25/2011  . Chronic anticoagulation 09/29/2011  . Nocturnal dyspnea 09/29/2011  . VITAMIN D DEFICIENCY 08/06/2009  . DVT 01/26/2009  . KNEE PAIN, RIGHT 06/19/2008  . EDEMA 06/19/2008  . ANKLE PAIN, LEFT 02/13/2008  . HYPERKALEMIA 08/20/2007  . HYPOKALEMIA 08/20/2007  . HLD (hyperlipidemia) 07/18/2007  . Cardiomyopathy, hypertensive ([place]) 07/18/2007  . Essential hypertension 06/26/2007  . Hypertensive heart disease with heart failure ([place]) 06/26/2007  . Congestive heart failure ([place]) 06/26/2007  . Allergic rhinitis, cause unspecified 06/26/2007  . ASTHMA 06/26/2007  . SYMPTOM, SYNCOPE AND COLLAPSE 06/26/2007  . HEADACHE 06/26/2007  . HEART MURMUR, HX  OF 06/26/2007  . DVT, HX OF 06/26/2007    Stark Bray 10/21/2020, 10:38 AM  [place], [place], [address] Phone: [phone]   Fax:  [phone]  Name: Caryle Helgeson MRN: [id number] Date of Birth: Mar 01, 1951

## 2020-10-26 ENCOUNTER — Other Ambulatory Visit: Payer: Self-pay

## 2020-10-26 ENCOUNTER — Encounter: Payer: Self-pay | Admitting: Rehabilitation

## 2020-10-26 ENCOUNTER — Ambulatory Visit: Payer: Medicare Other | Admitting: Rehabilitation

## 2020-10-26 DIAGNOSIS — M25612 Stiffness of left shoulder, not elsewhere classified: Secondary | ICD-10-CM

## 2020-10-26 DIAGNOSIS — Z171 Estrogen receptor negative status [ER-]: Secondary | ICD-10-CM

## 2020-10-26 DIAGNOSIS — R293 Abnormal posture: Secondary | ICD-10-CM

## 2020-10-26 DIAGNOSIS — I890 Lymphedema, not elsewhere classified: Secondary | ICD-10-CM

## 2020-10-26 DIAGNOSIS — C50812 Malignant neoplasm of overlapping sites of left female breast: Secondary | ICD-10-CM | POA: Diagnosis not present

## 2020-10-26 NOTE — Therapy (Signed)
[place], [place], [address] Phone: [phone]   Fax:  [phone]  Physical Therapy Treatment  Patient Details  Name: [name] Foster MRN: [id number] Date of Birth: 1951-02-29 Referring Provider (PT): Dr. Lindi Adie   Encounter Date: 10/26/2020   PT End of Session - 10/26/20 1202    Visit Number 24    Number of Visits 32    Date for PT Re-Evaluation 11/23/20    Authorization Type [employer] medicare    PT Start Time 1102    PT Stop Time 1202    PT Time Calculation (min) 60 min    Activity Tolerance Patient tolerated treatment well    Behavior During Therapy [facility] for tasks assessed/performed           Past Medical History:  Diagnosis Date  . Allergic rhinitis   . Anemia   . Aortic valve disorders   . Arthritis   . Asthma   . Cancer [facility])    Breast Cancer  . CHF (congestive heart failure) ([place])   . Coronary artery disease   . Family history of adverse reaction to anesthesia    difficulty waking mother up after surgery  . Family history of breast cancer   . Family history of cervical cancer   . Family history of colon cancer   . Family history of throat cancer   . Heart murmur   . Hyperlipidemia   . Hypertension   . Long term (current) use of anticoagulants   . Other primary cardiomyopathies   . Peripheral vascular disease (HCC)    right leg  . Personal history of colonic polyps   . Personal history of venous thrombosis and embolism     Past Surgical History:  Procedure Laterality Date  . ABDOMINAL HYSTERECTOMY    . BREAST BIOPSY Left 07/2019  . CARDIAC CATHETERIZATION  2004  . COLON SURGERY     colonoscopy  . DILATION AND CURETTAGE OF UTERUS     several  . MASTECTOMY MODIFIED RADICAL Left 03/09/2020   Left Modified Radical Mastectomy (left mastectomy with axillary lymph node dissection)   . MASTECTOMY MODIFIED RADICAL Left 03/09/2020   Procedure: LEFT MODIFIED RADICAL MASTECTOMY;   Surgeon: Stark Klein, MD;  Location: [place];  Service: General;  Laterality: Left;  GEN AND PECTORAL BLOCK  . PORT-A-CATH REMOVAL N/A 08/31/2020   Procedure: REMOVAL PORT-A-CATH;  Surgeon: Stark Klein, MD;  Location: [place];  Service: General;  Laterality: N/A;  . PORTACATH PLACEMENT Left 08/18/2019   Procedure: INSERTION PORT-A-CATH;  Surgeon: Stark Klein, MD;  Location: [place];  Service: General;  Laterality: Left;  . Rt knee arthoscopic    . TEE WITHOUT CARDIOVERSION N/A 02/21/2013   Procedure: TRANSESOPHAGEAL ECHOCARDIOGRAM (TEE);  Surgeon: Lelon Perla, MD;  Location: [facility] ENDOSCOPY;  Service: Cardiovascular;  Laterality: N/A;    There were no vitals filed for this visit.   Subjective Assessment - 10/26/20 1051    Subjective nothing new.  I was able to try some of the new stretches and it was ok    Pertinent History Patient was diagnosed on 07/24/2019 with left grade III invasive ductal carcinoma breast cancer. ER/PR negative and HER2 positive with a Ki67 of 70%. neoadjuvant chemotherapy completed.  Lt Mastectomy 03/09/20 with 19 axillary nodes removed and 2 inframammary nodes removed.  All negative.  Radiation consultation on 05/04/20.    Currently in Pain? No/denies  LYMPHEDEMA/ONCOLOGY QUESTIONNAIRE - 10/26/20 0001      Left Upper Extremity Lymphedema   At Axilla  43.8 cm    15 cm Proximal to Olecranon Process 41.7 cm    10 cm Proximal to Olecranon Process 39 cm    Olecranon Process 31.3 cm    15 cm Proximal to Ulnar Styloid Process 30.6 cm    10 cm Proximal to Ulnar Styloid Process 27.6 cm    Just Proximal to Ulnar Styloid Process 19.5 cm    Across Hand at [employer] 20.5 cm    At [place] of 2nd Digit 6.2 cm              Flowsheet Row [facility]patient [facility]  Lymphedema Life Impact Scale Total Score 38.24 %            [facility] Adult PT Treatment/Exercise - 10/26/20 0001       Shoulder Exercises: Supine   Flexion AAROM;Both;10 reps    Shoulder Flexion Weight (lbs) 2      Shoulder Exercises: Standing   Extension Both;10 reps;Theraband    Theraband Level (Shoulder Extension) Level 1 (Yellow)    Extension Limitations with initial cueing    Row 10 reps    Theraband Level (Shoulder Row) Level 1 (Yellow)      Shoulder Exercises: Pulleys   Flexion 2 minutes    Scaption 2 minutes      Shoulder Exercises: Therapy Ball   Flexion Both;5 reps    Scaption Left;5 reps    Scaption Limitations with Rt assistance needed the whole movement    Other Therapy Ball Exercises UE ranger flexion x 5, scaption/abduction attempts x 5 (easier than the ball for pt)      Shoulder Exercises: Stretch   External Rotation Stretch 5 reps   active movement for release   Other Shoulder Stretches supine T stretch for horizontal abduction 2x45"    Other Shoulder Stretches alternating horizontal abduction x 10 in supine for gentle stretch      Manual Therapy   Edema Management remeasured UE    Manual Lymphatic Drainage (MLD) in supine: short neck, sternal and clavicular nodes, bil axillary nodes, and left inguinal nodes, then Lt UE from proximal to distal and in sidelying posterior interaxillary and axillo inguinal work to the trunk.    Passive ROM to the left shoulder into flexion, abduction, ER at various angles and D2 to tolerance                       PT Long Term Goals - 10/26/20 1305      PT LONG TERM GOAL #1   Title Pt will improve her Lt shoulder active flexion and abduction to at least 140 to improve overhead reaching    Baseline 50 / 60, 104/105 on 09/14/20    Status On-going      PT LONG TERM GOAL #2   Title Pt will decrease circumferential measurements by at least 2cm globally proximal to the wrist to demonstrate improved Lt UE edema    Status On-going      PT LONG TERM GOAL #3   Title Pt will obtain appropriate compression garments for the Lt UE to manage  lymphedema    Status Achieved      PT LONG TERM GOAL #4   Title Pt will be ind with final HEP    Status On-going  Plan - 10/26/20 1303    Clinical Impression Statement Noticed that the upper arm and anterior chest as well as lateral trunk looked a bit more puffy today so more MLD was included as well as remeasurement of circumferences which were unchanged in the lower arm and increased by around 1-2cm in the upper arm.  Discussed pursuing a vasopneumatic pump device for trunk and UE edema as it is still worsening with use of self MLD, current compression garments, and UE exercise.  Pt is agreeable to getting more information.  Extended POC today to continue monitoring shoulder ROM and lymphedema.    Personal Factors and Comorbidities Age;Finances;Comorbidity 1    Comorbidities ALND,    Examination-Activity Limitations Dressing;Sleep;Reach Overhead;Carry    Examination-Participation Restrictions Yard Work;Cleaning;Laundry;Community Activity    PT Frequency 2x / week    PT Duration 4 weeks    PT Treatment/Interventions ADLs/Self Care Home Management;Therapeutic exercise;Patient/family education;Moist Heat;Therapeutic activities;Manual techniques;Joint Manipulations;Passive range of motion;Cryotherapy;Manual lymph drainage;Compression bandaging;Taping    PT Next Visit Plan any pump info back? Lt shoulder PROM, AAROM and Lt UE MLD, scapular strengthening    PT Home Exercise Plan focusing on HEP given 08/03/20, new HEP 10/12/20    Consulted and Agree with Plan of Care Patient           Patient will benefit from skilled therapeutic intervention in order to improve the following deficits and impairments:  Postural dysfunction,Decreased knowledge of precautions,Impaired UE functional use,Pain,Decreased range of motion,Decreased strength,Increased edema  Visit Diagnosis: Stiffness of left shoulder, not elsewhere classified  Abnormal posture  Lymphedema  Malignant  neoplasm of overlapping sites of left breast in female, estrogen receptor negative ([place])     Problem List Patient Active Problem List   Diagnosis Date Noted  . Breast cancer metastasized to axillary lymph node, left ([place]) 03/09/2020  . Coagulopathy ([place]) 12/05/2019  . Epistaxis, recurrent 12/05/2019  . Dehydration   . Non-intractable vomiting   . Intractable nausea and vomiting 09/21/2019  . Generalized weakness 09/21/2019  . AKI (acute kidney injury) ([place]) 09/21/2019  . Leukocytosis 09/21/2019  . Hypernatremia 09/21/2019  . Hyperchloremia 09/21/2019  . Thrombocytopenia ([place]) 09/21/2019  . Port-A-Cath in place 09/08/2019  . Family history of breast cancer   . Family history of throat cancer   . Family history of cervical cancer   . Malignant neoplasm of overlapping sites of left breast in female, estrogen receptor negative ([place]) 08/01/2019  . Family history of colon cancer mom 11/05/2018  . Encounter for therapeutic drug monitoring 11/21/2013  . Asthma with bronchitis 09/01/2013  . Chest pain 01/01/2013  . Aortic insufficiency 10/25/2011  . Chronic anticoagulation 09/29/2011  . Nocturnal dyspnea 09/29/2011  . VITAMIN D DEFICIENCY 08/06/2009  . DVT 01/26/2009  . KNEE PAIN, RIGHT 06/19/2008  . EDEMA 06/19/2008  . ANKLE PAIN, LEFT 02/13/2008  . HYPERKALEMIA 08/20/2007  . HYPOKALEMIA 08/20/2007  . HLD (hyperlipidemia) 07/18/2007  . Cardiomyopathy, hypertensive ([place]) 07/18/2007  . Essential hypertension 06/26/2007  . Hypertensive heart disease with heart failure ([place]) 06/26/2007  . Congestive heart failure ([place]) 06/26/2007  . Allergic rhinitis, cause unspecified 06/26/2007  . ASTHMA 06/26/2007  . SYMPTOM, SYNCOPE AND COLLAPSE 06/26/2007  . HEADACHE 06/26/2007  . HEART MURMUR, HX OF 06/26/2007  . DVT, HX OF 06/26/2007    Shan Levans, PT 10/26/2020, 1:06 PM  [place], [place],  [address] Phone: [phone]   Fax:  [phone]  Name: Emarie Paul MRN: [id number] Date of  Birth: Apr 29, 1951

## 2020-10-27 ENCOUNTER — Other Ambulatory Visit: Payer: Self-pay

## 2020-10-27 NOTE — Progress Notes (Signed)
Order faxed for lymphedema pump successfully to cancer rehab @ [phone].

## 2020-11-01 ENCOUNTER — Encounter: Payer: Self-pay | Admitting: Licensed Clinical Social Worker

## 2020-11-01 DIAGNOSIS — Z1379 Encounter for other screening for genetic and chromosomal anomalies: Secondary | ICD-10-CM | POA: Insufficient documentation

## 2020-11-02 ENCOUNTER — Ambulatory Visit: Payer: Medicare Other | Attending: Hematology and Oncology | Admitting: Rehabilitation

## 2020-11-02 ENCOUNTER — Encounter: Payer: Self-pay | Admitting: Rehabilitation

## 2020-11-02 ENCOUNTER — Other Ambulatory Visit: Payer: Self-pay

## 2020-11-02 DIAGNOSIS — I890 Lymphedema, not elsewhere classified: Secondary | ICD-10-CM | POA: Insufficient documentation

## 2020-11-02 DIAGNOSIS — C50812 Malignant neoplasm of overlapping sites of left female breast: Secondary | ICD-10-CM | POA: Diagnosis not present

## 2020-11-02 DIAGNOSIS — R293 Abnormal posture: Secondary | ICD-10-CM | POA: Insufficient documentation

## 2020-11-02 DIAGNOSIS — M25612 Stiffness of left shoulder, not elsewhere classified: Secondary | ICD-10-CM | POA: Insufficient documentation

## 2020-11-02 DIAGNOSIS — Z171 Estrogen receptor negative status [ER-]: Secondary | ICD-10-CM | POA: Diagnosis not present

## 2020-11-02 NOTE — Therapy (Signed)
[place], [place], [address] Phone: [phone]   Fax:  [phone]  Physical Therapy Treatment  Patient Details  Name: [name] Foster MRN: [id number] Date of Birth: Dec 30, 1950 Referring Provider (PT): Dr. Lindi Adie   Encounter Date: 11/02/2020   PT End of Session - 11/02/20 1048    Visit Number 25    Number of Visits 32    Date for PT Re-Evaluation 11/23/20    PT Start Time 0952    PT Stop Time 1049    PT Time Calculation (min) 57 min    Activity Tolerance Patient tolerated treatment well    Behavior During Therapy [facility] for tasks assessed/performed           Past Medical History:  Diagnosis Date  . Allergic rhinitis   . Anemia   . Aortic valve disorders   . Arthritis   . Asthma   . Cancer [facility])    Breast Cancer  . CHF (congestive heart failure) ([place])   . Coronary artery disease   . Family history of adverse reaction to anesthesia    difficulty waking mother up after surgery  . Family history of breast cancer   . Family history of cervical cancer   . Family history of colon cancer   . Family history of throat cancer   . Heart murmur   . Hyperlipidemia   . Hypertension   . Long term (current) use of anticoagulants   . Other primary cardiomyopathies   . Peripheral vascular disease (HCC)    right leg  . Personal history of colonic polyps   . Personal history of venous thrombosis and embolism     Past Surgical History:  Procedure Laterality Date  . ABDOMINAL HYSTERECTOMY    . BREAST BIOPSY Left 07/2019  . CARDIAC CATHETERIZATION  2004  . COLON SURGERY     colonoscopy  . DILATION AND CURETTAGE OF UTERUS     several  . MASTECTOMY MODIFIED RADICAL Left 03/09/2020   Left Modified Radical Mastectomy (left mastectomy with axillary lymph node dissection)   . MASTECTOMY MODIFIED RADICAL Left 03/09/2020   Procedure: LEFT MODIFIED RADICAL MASTECTOMY;  Surgeon: Stark Klein, MD;   Location: [place];  Service: General;  Laterality: Left;  GEN AND PECTORAL BLOCK  . PORT-A-CATH REMOVAL N/A 08/31/2020   Procedure: REMOVAL PORT-A-CATH;  Surgeon: Stark Klein, MD;  Location: [place];  Service: General;  Laterality: N/A;  . PORTACATH PLACEMENT Left 08/18/2019   Procedure: INSERTION PORT-A-CATH;  Surgeon: Stark Klein, MD;  Location: [place];  Service: General;  Laterality: Left;  . Rt knee arthoscopic    . TEE WITHOUT CARDIOVERSION N/A 02/21/2013   Procedure: TRANSESOPHAGEAL ECHOCARDIOGRAM (TEE);  Surgeon: Lelon Perla, MD;  Location: [facility] ENDOSCOPY;  Service: Cardiovascular;  Laterality: N/A;    There were no vitals filed for this visit.   Subjective Assessment - 11/02/20 0951    Subjective I think it may be not quite as bad    Pertinent History Patient was diagnosed on 07/24/2019 with left grade III invasive ductal carcinoma breast cancer. ER/PR negative and HER2 positive with a Ki67 of 70%. neoadjuvant chemotherapy completed.  Lt Mastectomy 03/09/20 with 19 axillary nodes removed and 2 inframammary nodes removed.  All negative.  Radiation consultation on 05/04/20.    Patient Stated Goals swelling down, more mobility, get radiation started    Currently in Pain? No/denies  Flowsheet Row Outpatient Re[facility]  Lymphedema Life Impact Scale Total Score 38.24 %            [facility] Adult PT Treatment/Exercise - 11/02/20 0001      Shoulder Exercises: Supine   Horizontal ABduction Both;15 reps    Theraband Level (Shoulder Horizontal ABduction) Level 1 (Yellow)    External Rotation Both;15 reps    Theraband Level (Shoulder External Rotation) Level 1 (Yellow)    Flexion AAROM;Both;10 reps    Shoulder Flexion Weight (lbs) 2    Flexion Limitations with dowel    Other Supine Exercises supine butterfly 5x10"      Shoulder Exercises: Pulleys   Flexion 2 minutes    Flexion Limitations ...Marland KitchenMarland KitchenMarland Kitchen     ABduction 2 minutes    ABduction Limitations as much abduction as able      Manual Therapy   Soft tissue mobilization to the left pectoralis and UT    Myofascial Release To Lt axilla during P/ROM    Manual Lymphatic Drainage (MLD) in supine: short neck, sternal and clavicular nodes, bil axillary nodes, and left inguinal nodes, then Lt UE from proximal to distal and in sidelying posterior interaxillary and axillo inguinal work to the trunk.    Passive ROM to the left shoulder into flexion, abduction, ER at various angles and D2 to tolerance                       PT Long Term Goals - 10/26/20 1305      PT LONG TERM GOAL #1   Title Pt will improve her Lt shoulder active flexion and abduction to at least 140 to improve overhead reaching    Baseline 50 / 60, 104/105 on 09/14/20    Status On-going      PT LONG TERM GOAL #2   Title Pt will decrease circumferential measurements by at least 2cm globally proximal to the wrist to demonstrate improved Lt UE edema    Status On-going      PT LONG TERM GOAL #3   Title Pt will obtain appropriate compression garments for the Lt UE to manage lymphedema    Status Achieved      PT LONG TERM GOAL #4   Title Pt will be ind with final HEP    Status On-going                 Plan - 11/02/20 1048    Clinical Impression Statement Pt with less chest and UE puffiness today improved from last visit.  heard back from pump which pt is interested in so lymphapress will most likely reach out to her after an email today.  Continued with MLD and release of the left upper quadrant to decrease tightness.    PT Frequency 2x / week    PT Duration 4 weeks    PT Treatment/Interventions ADLs/Self Care Home Management;Therapeutic exercise;Patient/family education;Moist Heat;Therapeutic activities;Manual techniques;Joint Manipulations;Passive range of motion;Cryotherapy;Manual lymph drainage;Compression bandaging;Taping    PT Next Visit Plan any pump  updates? Lt shoulder PROM, AAROM and Lt UE MLD, scapular strengthening    Consulted and Agree with Plan of Care Patient           Patient will benefit from skilled therapeutic intervention in order to improve the following deficits and impairments:     Visit Diagnosis: Abnormal posture  Stiffness of left shoulder, not elsewhere classified  Lymphedema  Malignant neoplasm of overlapping sites of left  breast in female, estrogen receptor negative [facility])     Problem List Patient Active Problem List   Diagnosis Date Noted  . Genetic testing 11/01/2020  . Breast cancer metastasized to axillary lymph node, left ([place]) 03/09/2020  . Coagulopathy ([place]) 12/05/2019  . Epistaxis, recurrent 12/05/2019  . Dehydration   . Non-intractable vomiting   . Intractable nausea and vomiting 09/21/2019  . Generalized weakness 09/21/2019  . AKI (acute kidney injury) ([place]) 09/21/2019  . Leukocytosis 09/21/2019  . Hypernatremia 09/21/2019  . Hyperchloremia 09/21/2019  . Thrombocytopenia ([place]) 09/21/2019  . Port-A-Cath in place 09/08/2019  . Family history of breast cancer   . Family history of throat cancer   . Family history of cervical cancer   . Malignant neoplasm of overlapping sites of left breast in female, estrogen receptor negative ([place]) 08/01/2019  . Family history of colon cancer mom 11/05/2018  . Encounter for therapeutic drug monitoring 11/21/2013  . Asthma with bronchitis 09/01/2013  . Chest pain 01/01/2013  . Aortic insufficiency 10/25/2011  . Chronic anticoagulation 09/29/2011  . Nocturnal dyspnea 09/29/2011  . VITAMIN D DEFICIENCY 08/06/2009  . DVT 01/26/2009  . KNEE PAIN, RIGHT 06/19/2008  . EDEMA 06/19/2008  . ANKLE PAIN, LEFT 02/13/2008  . HYPERKALEMIA 08/20/2007  . HYPOKALEMIA 08/20/2007  . HLD (hyperlipidemia) 07/18/2007  . Cardiomyopathy, hypertensive ([place]) 07/18/2007  . Essential hypertension 06/26/2007  . Hypertensive heart disease with heart failure ([place])  06/26/2007  . Congestive heart failure ([place]) 06/26/2007  . Allergic rhinitis, cause unspecified 06/26/2007  . ASTHMA 06/26/2007  . SYMPTOM, SYNCOPE AND COLLAPSE 06/26/2007  . HEADACHE 06/26/2007  . HEART MURMUR, HX OF 06/26/2007  . DVT, HX OF 06/26/2007    Stark Bray 11/02/2020, 10:54 AM  [place], [place], [address] Phone: [phone]   Fax:  [phone]  Name: Jacyln Carmer MRN: [id number] Date of Birth: [birth year]

## 2020-11-04 ENCOUNTER — Telehealth: Payer: Self-pay | Admitting: Internal Medicine

## 2020-11-04 NOTE — Telephone Encounter (Signed)
Left message for patient to call back and schedule Medicare Annual Wellness Visit (AWV) either virtually or in office. No detailed information left   Last AWV no information  please schedule at anytime with [facility] Nurse Health Advisor 1 or 2   This should be a 45 minute visit. Patient also needs appointment with PCP last appointment 11/05/2018

## 2020-11-05 ENCOUNTER — Ambulatory Visit: Payer: Medicare Other | Admitting: Rehabilitation

## 2020-11-05 ENCOUNTER — Encounter: Payer: Self-pay | Admitting: Rehabilitation

## 2020-11-05 ENCOUNTER — Other Ambulatory Visit: Payer: Self-pay

## 2020-11-05 DIAGNOSIS — I890 Lymphedema, not elsewhere classified: Secondary | ICD-10-CM

## 2020-11-05 DIAGNOSIS — R293 Abnormal posture: Secondary | ICD-10-CM

## 2020-11-05 DIAGNOSIS — C50812 Malignant neoplasm of overlapping sites of left female breast: Secondary | ICD-10-CM

## 2020-11-05 DIAGNOSIS — Z171 Estrogen receptor negative status [ER-]: Secondary | ICD-10-CM

## 2020-11-05 DIAGNOSIS — M25612 Stiffness of left shoulder, not elsewhere classified: Secondary | ICD-10-CM

## 2020-11-05 NOTE — Therapy (Signed)
[place], [place], [address] Phone: [phone]   Fax:  [phone]  Physical Therapy Treatment  Patient Details  Name: [name] Foster MRN: [id number] Date of Birth: [birth year] Referring Provider (PT): Dr. Lindi Adie   Encounter Date: 11/05/2020   PT End of Session - 11/05/20 0944    Visit Number 26    Number of Visits 32    Date for PT Re-Evaluation 11/23/20    PT Start Time 0900    PT Stop Time 0948    PT Time Calculation (min) 48 min    Activity Tolerance Patient tolerated treatment well    Behavior During Therapy [facility] for tasks assessed/performed           Past Medical History:  Diagnosis Date  . Allergic rhinitis   . Anemia   . Aortic valve disorders   . Arthritis   . Asthma   . Cancer [facility])    Breast Cancer  . CHF (congestive heart failure) ([place])   . Coronary artery disease   . Family history of adverse reaction to anesthesia    difficulty waking mother up after surgery  . Family history of breast cancer   . Family history of cervical cancer   . Family history of colon cancer   . Family history of throat cancer   . Heart murmur   . Hyperlipidemia   . Hypertension   . Long term (current) use of anticoagulants   . Other primary cardiomyopathies   . Peripheral vascular disease (HCC)    right leg  . Personal history of colonic polyps   . Personal history of venous thrombosis and embolism     Past Surgical History:  Procedure Laterality Date  . ABDOMINAL HYSTERECTOMY    . BREAST BIOPSY Left 07/2019  . CARDIAC CATHETERIZATION  2004  . COLON SURGERY     colonoscopy  . DILATION AND CURETTAGE OF UTERUS     several  . MASTECTOMY MODIFIED RADICAL Left 03/09/2020   Left Modified Radical Mastectomy (left mastectomy with axillary lymph node dissection)   . MASTECTOMY MODIFIED RADICAL Left 03/09/2020   Procedure: LEFT MODIFIED RADICAL MASTECTOMY;  Surgeon: Stark Klein, MD;   Location: [place];  Service: General;  Laterality: Left;  GEN AND PECTORAL BLOCK  . PORT-A-CATH REMOVAL N/A 08/31/2020   Procedure: REMOVAL PORT-A-CATH;  Surgeon: Stark Klein, MD;  Location: [place];  Service: General;  Laterality: N/A;  . PORTACATH PLACEMENT Left 08/18/2019   Procedure: INSERTION PORT-A-CATH;  Surgeon: Stark Klein, MD;  Location: [place];  Service: General;  Laterality: Left;  . Rt knee arthoscopic    . TEE WITHOUT CARDIOVERSION N/A 02/21/2013   Procedure: TRANSESOPHAGEAL ECHOCARDIOGRAM (TEE);  Surgeon: Lelon Perla, MD;  Location: [facility] ENDOSCOPY;  Service: Cardiovascular;  Laterality: N/A;    There were no vitals filed for this visit.   Subjective Assessment - 11/05/20 0857    Subjective I have not heard from the pump yet.    Pertinent History Patient was diagnosed on 07/24/2019 with left grade III invasive ductal carcinoma breast cancer. ER/PR negative and HER2 positive with a Ki67 of 70%. neoadjuvant chemotherapy completed.  Lt Mastectomy 03/09/20 with 19 axillary nodes removed and 2 inframammary nodes removed.  All negative.  Radiation consultation on 05/04/20.    Currently in Pain? No/denies              [facility] PT Assessment - 11/05/20 0001      AROM   Left  Shoulder Flexion 115 Degrees   pull   Left Shoulder ABduction 100 Degrees   feels stiff                Flowsheet Row Outpatient [facility]  Lymphedema Life Impact Scale Total Score 38.24 %            [facility] Adult PT Treatment/Exercise - 11/05/20 0001      Shoulder Exercises: Supine   Flexion AROM;Both;10 reps    Flexion Limitations alternating flexion      Shoulder Exercises: Standing   ABduction AAROM;Left;5 reps    ABduction Limitations PT assist into abduction with end range tightness limiting around 110      Manual Therapy   Soft tissue mobilization to the left pectoralis and UT    Myofascial Release To Lt axilla during P/ROM     Manual Lymphatic Drainage (MLD) in supine: short neck, sternal and clavicular nodes, bil axillary nodes, and left inguinal nodes, then Lt UE from proximal to distal and in sidelying posterior interaxillary and axillo inguinal work to the trunk.    Passive ROM to the left shoulder into flexion, abduction, ER at various angles and D2 to tolerance                       PT Long Term Goals - 11/05/20 0944      PT LONG TERM GOAL #1   Title Pt will improve her Lt shoulder active flexion and abduction to at least 140 to improve overhead reaching    Baseline 50 / 60, 104/105 on 09/14/20, 115 on 11/05/20    Status Partially Met      PT LONG TERM GOAL #2   Title Pt will decrease circumferential measurements by at least 2cm globally proximal to the wrist to demonstrate improved Lt UE edema    Status On-going      PT LONG TERM GOAL #3   Title Pt will obtain appropriate compression garments for the Lt UE to manage lymphedema    Status Achieved      PT LONG TERM GOAL #4   Title Pt will be ind with final HEP    Status On-going                 Plan - 11/05/20 0945    Clinical Impression Statement Pt is progressing flexion AROM but not abduction AROM with more AAROM still available so having potential for more motion.  Has not heard back from pump yet but hopefully soon.    PT Frequency 2x / week    PT Duration 4 weeks    PT Treatment/Interventions ADLs/Self Care Home Management;Therapeutic exercise;Patient/family education;Moist Heat;Therapeutic activities;Manual techniques;Joint Manipulations;Passive range of motion;Cryotherapy;Manual lymph drainage;Compression bandaging;Taping    PT Next Visit Plan any pump updates? Lt shoulder PROM, AAROM and Lt UE MLD, scapular strengthening    PT Home Exercise Plan focusing on HEP given 08/03/20, new HEP 10/12/20    Consulted and Agree with Plan of Care Patient           Patient will benefit from skilled therapeutic intervention in order  to improve the following deficits and impairments:     Visit Diagnosis: Abnormal posture  Stiffness of left shoulder, not elsewhere classified  Lymphedema  Malignant neoplasm of overlapping sites of left breast in female, estrogen receptor negative ([place])     Problem List Patient Active Problem List   Diagnosis Date Noted  . Genetic  testing 11/01/2020  . Breast cancer metastasized to axillary lymph node, left ([place]) 03/09/2020  . Coagulopathy ([place]) 12/05/2019  . Epistaxis, recurrent 12/05/2019  . Dehydration   . Non-intractable vomiting   . Intractable nausea and vomiting 09/21/2019  . Generalized weakness 09/21/2019  . AKI (acute kidney injury) ([place]) 09/21/2019  . Leukocytosis 09/21/2019  . Hypernatremia 09/21/2019  . Hyperchloremia 09/21/2019  . Thrombocytopenia ([place]) 09/21/2019  . Port-A-Cath in place 09/08/2019  . Family history of breast cancer   . Family history of throat cancer   . Family history of cervical cancer   . Malignant neoplasm of overlapping sites of left breast in female, estrogen receptor negative ([place]) 08/01/2019  . Family history of colon cancer mom 11/05/2018  . Encounter for therapeutic drug monitoring 11/21/2013  . Asthma with bronchitis 09/01/2013  . Chest pain 01/01/2013  . Aortic insufficiency 10/25/2011  . Chronic anticoagulation 09/29/2011  . Nocturnal dyspnea 09/29/2011  . VITAMIN D DEFICIENCY 08/06/2009  . DVT 01/26/2009  . KNEE PAIN, RIGHT 06/19/2008  . EDEMA 06/19/2008  . ANKLE PAIN, LEFT 02/13/2008  . HYPERKALEMIA 08/20/2007  . HYPOKALEMIA 08/20/2007  . HLD (hyperlipidemia) 07/18/2007  . Cardiomyopathy, hypertensive ([place]) 07/18/2007  . Essential hypertension 06/26/2007  . Hypertensive heart disease with heart failure ([place]) 06/26/2007  . Congestive heart failure ([place]) 06/26/2007  . Allergic rhinitis, cause unspecified 06/26/2007  . ASTHMA 06/26/2007  . SYMPTOM, SYNCOPE AND COLLAPSE 06/26/2007  . HEADACHE 06/26/2007  . HEART  MURMUR, HX OF 06/26/2007  . DVT, HX OF 06/26/2007    Stark Bray 11/05/2020, 9:47 AM  [place], [place], [address] Phone: [phone]   Fax:  [phone]  Name: Floriene Jeschke MRN: [id number] Date of Birth: 09/18/1951

## 2020-11-09 ENCOUNTER — Encounter: Payer: Self-pay | Admitting: Rehabilitation

## 2020-11-09 ENCOUNTER — Ambulatory Visit: Payer: Medicare Other | Admitting: Rehabilitation

## 2020-11-09 ENCOUNTER — Other Ambulatory Visit: Payer: Self-pay

## 2020-11-09 DIAGNOSIS — I890 Lymphedema, not elsewhere classified: Secondary | ICD-10-CM | POA: Diagnosis not present

## 2020-11-09 DIAGNOSIS — C50812 Malignant neoplasm of overlapping sites of left female breast: Secondary | ICD-10-CM

## 2020-11-09 DIAGNOSIS — Z171 Estrogen receptor negative status [ER-]: Secondary | ICD-10-CM | POA: Diagnosis not present

## 2020-11-09 DIAGNOSIS — M25612 Stiffness of left shoulder, not elsewhere classified: Secondary | ICD-10-CM

## 2020-11-09 DIAGNOSIS — R293 Abnormal posture: Secondary | ICD-10-CM | POA: Diagnosis not present

## 2020-11-09 NOTE — Therapy (Signed)
[place], [place], [address] Phone: [phone]   Fax:  [phone]  Physical Therapy Treatment  Patient Details  Name: [name] Foster MRN: [id number] Date of Birth: May 12, 1951 Referring Provider (PT): Dr. Lindi Adie   Encounter Date: 11/09/2020   PT End of Session - 11/09/20 1047    Visit Number 27    Number of Visits 32    Date for PT Re-Evaluation 11/23/20    PT Start Time 1000    PT Stop Time 1047    PT Time Calculation (min) 47 min    Activity Tolerance Patient tolerated treatment well    Behavior During Therapy [facility] for tasks assessed/performed           Past Medical History:  Diagnosis Date  . Allergic rhinitis   . Anemia   . Aortic valve disorders   . Arthritis   . Asthma   . Cancer [facility])    Breast Cancer  . CHF (congestive heart failure) ([place])   . Coronary artery disease   . Family history of adverse reaction to anesthesia    difficulty waking mother up after surgery  . Family history of breast cancer   . Family history of cervical cancer   . Family history of colon cancer   . Family history of throat cancer   . Heart murmur   . Hyperlipidemia   . Hypertension   . Long term (current) use of anticoagulants   . Other primary cardiomyopathies   . Peripheral vascular disease (HCC)    right leg  . Personal history of colonic polyps   . Personal history of venous thrombosis and embolism     Past Surgical History:  Procedure Laterality Date  . ABDOMINAL HYSTERECTOMY    . BREAST BIOPSY Left 07/2019  . CARDIAC CATHETERIZATION  2004  . COLON SURGERY     colonoscopy  . DILATION AND CURETTAGE OF UTERUS     several  . MASTECTOMY MODIFIED RADICAL Left 03/09/2020   Left Modified Radical Mastectomy (left mastectomy with axillary lymph node dissection)   . MASTECTOMY MODIFIED RADICAL Left 03/09/2020   Procedure: LEFT MODIFIED RADICAL MASTECTOMY;  Surgeon: [name] Klein, MD;   Location: [place];  Service: General;  Laterality: Left;  GEN AND PECTORAL BLOCK  . PORT-A-CATH REMOVAL N/A 08/31/2020   Procedure: REMOVAL PORT-A-CATH;  Surgeon: [name] Klein, MD;  Location: [place];  Service: General;  Laterality: N/A;  . PORTACATH PLACEMENT Left 08/18/2019   Procedure: INSERTION PORT-A-CATH;  Surgeon: [name] Klein, MD;  Location: [place];  Service: General;  Laterality: Left;  . Rt knee arthoscopic    . TEE WITHOUT CARDIOVERSION N/A 02/21/2013   Procedure: TRANSESOPHAGEAL ECHOCARDIOGRAM (TEE);  Surgeon: Lelon Perla, MD;  Location: [facility] ENDOSCOPY;  Service: Cardiovascular;  Laterality: N/A;    There were no vitals filed for this visit.   Subjective Assessment - 11/09/20 1001    Subjective The pump people are coming on Thursday for a trial    Pertinent History Patient was diagnosed on 07/24/2019 with left grade III invasive ductal carcinoma breast cancer. ER/PR negative and HER2 positive with a Ki67 of 70%. neoadjuvant chemotherapy completed.  Lt Mastectomy 03/09/20 with 19 axillary nodes removed and 2 inframammary nodes removed.  All negative.  Radiation consultation on 05/04/20.    Currently in Pain? No/denies              [facility] PT Assessment - 11/09/20 0001      AROM  Left Shoulder Flexion 110 Degrees    Left Shoulder ABduction 90 Degrees             LYMPHEDEMA/ONCOLOGY QUESTIONNAIRE - 11/09/20 0001      Left Upper Extremity Lymphedema   At Axilla  43 cm    15 cm Proximal to Olecranon Process 42.4 cm    10 cm Proximal to Olecranon Process 40.3 cm              Flowsheet Row Outpatient Rehab [facility]  Lymphedema Life Impact Scale Total Score 38.24 %            [facility] Adult PT Treatment/Exercise - 11/09/20 0001      Shoulder Exercises: Standing   Protraction Limitations arms straight at the wall x 10 with tcs/vcs but with good performance    Flexion AAROM;5 reps    Flexion Limitations PT  assist in front of mirror    ABduction AAROM;Left;5 reps    ABduction Limitations PT assist in front of the mirror    Other Standing Exercises wall push up x 10      Shoulder Exercises: Pulleys   Flexion 2 minutes    ABduction 2 minutes    ABduction Limitations as much abduction as able      Shoulder Exercises: Therapy Ball   Other Therapy Ball Exercises UE ranger flexion x 10, scaption/abduction attempts x 10      Manual Therapy   Manual Lymphatic Drainage (MLD) in supine: short neck, sternal and clavicular nodes, bil axillary nodes, and left inguinal nodes, then Lt UE from proximal to distal and in sidelying posterior interaxillary and axillo inguinal work to the trunk.    Passive ROM to the left shoulder into flexion, abduction, ER at various angles and D2 to tolerance                       PT Long Term Goals - 11/05/20 0944      PT LONG TERM GOAL #1   Title Pt will improve her Lt shoulder active flexion and abduction to at least 140 to improve overhead reaching    Baseline 50 / 60, 104/105 on 09/14/20, 115 on 11/05/20    Status Partially Met      PT LONG TERM GOAL #2   Title Pt will decrease circumferential measurements by at least 2cm globally proximal to the wrist to demonstrate improved Lt UE edema    Status On-going      PT LONG TERM GOAL #3   Title Pt will obtain appropriate compression garments for the Lt UE to manage lymphedema    Status Achieved      PT LONG TERM GOAL #4   Title Pt will be ind with final HEP    Status On-going                 Plan - 11/09/20 1047    Clinical Impression Statement Pt continues to struggle with regaining AROM into flexion and Abduction.  The edema is maintained currently fluctuating a bit in the upper arm but pt looking forward to pump trial on Thursday and new edema has seemed to have plateau/    PT Frequency 2x / week    PT Duration 4 weeks    PT Treatment/Interventions ADLs/Self Care Home  Management;Therapeutic exercise;Patient/family education;Moist Heat;Therapeutic activities;Manual techniques;Joint Manipulations;Passive range of motion;Cryotherapy;Manual lymph drainage;Compression bandaging;Taping    Consulted and Agree with Plan of Care Patient  Patient will benefit from skilled therapeutic intervention in order to improve the following deficits and impairments:     Visit Diagnosis: Abnormal posture  Stiffness of left shoulder, not elsewhere classified  Lymphedema  Malignant neoplasm of overlapping sites of left breast in female, estrogen receptor negative [facility])     Problem List Patient Active Problem List   Diagnosis Date Noted  . Genetic testing 11/01/2020  . Breast cancer metastasized to axillary lymph node, left ([place]) 03/09/2020  . Coagulopathy ([place]) 12/05/2019  . Epistaxis, recurrent 12/05/2019  . Dehydration   . Non-intractable vomiting   . Intractable nausea and vomiting 09/21/2019  . Generalized weakness 09/21/2019  . AKI (acute kidney injury) ([place]) 09/21/2019  . Leukocytosis 09/21/2019  . Hypernatremia 09/21/2019  . Hyperchloremia 09/21/2019  . Thrombocytopenia ([place]) 09/21/2019  . Port-A-Cath in place 09/08/2019  . Family history of breast cancer   . Family history of throat cancer   . Family history of cervical cancer   . Malignant neoplasm of overlapping sites of left breast in female, estrogen receptor negative ([place]) 08/01/2019  . Family history of colon cancer mom 11/05/2018  . Encounter for therapeutic drug monitoring 11/21/2013  . Asthma with bronchitis 09/01/2013  . Chest pain 01/01/2013  . Aortic insufficiency 10/25/2011  . Chronic anticoagulation 09/29/2011  . Nocturnal dyspnea 09/29/2011  . VITAMIN D DEFICIENCY 08/06/2009  . DVT 01/26/2009  . KNEE PAIN, RIGHT 06/19/2008  . EDEMA 06/19/2008  . ANKLE PAIN, LEFT 02/13/2008  . HYPERKALEMIA 08/20/2007  . HYPOKALEMIA 08/20/2007  . HLD (hyperlipidemia) 07/18/2007  .  Cardiomyopathy, hypertensive ([place]) 07/18/2007  . Essential hypertension 06/26/2007  . Hypertensive heart disease with heart failure ([place]) 06/26/2007  . Congestive heart failure ([place]) 06/26/2007  . Allergic rhinitis, cause unspecified 06/26/2007  . ASTHMA 06/26/2007  . SYMPTOM, SYNCOPE AND COLLAPSE 06/26/2007  . HEADACHE 06/26/2007  . HEART MURMUR, HX OF 06/26/2007  . DVT, HX OF 06/26/2007    [name] Foster 11/09/2020, 10:49 AM  [place], [place], [address] Phone: [phone]   Fax:  [phone]  Name: [name] Foster MRN: [id number] Date of Birth: 02-Oct-1951

## 2020-11-11 ENCOUNTER — Ambulatory Visit: Payer: Medicare Other | Admitting: Rehabilitation

## 2020-11-11 ENCOUNTER — Encounter: Payer: Self-pay | Admitting: Rehabilitation

## 2020-11-11 ENCOUNTER — Other Ambulatory Visit: Payer: Self-pay

## 2020-11-11 DIAGNOSIS — C50812 Malignant neoplasm of overlapping sites of left female breast: Secondary | ICD-10-CM

## 2020-11-11 DIAGNOSIS — I890 Lymphedema, not elsewhere classified: Secondary | ICD-10-CM

## 2020-11-11 DIAGNOSIS — Z171 Estrogen receptor negative status [ER-]: Secondary | ICD-10-CM

## 2020-11-11 NOTE — Therapy (Signed)
[place], [place], [address] Phone: [phone]   Fax:  [phone]  Physical Therapy Treatment  Patient Details  Name: [name] Foster MRN: [id number] Date of Birth: 02-21-1951 Referring Provider (PT): Dr. Lindi Adie   Encounter Date: 11/11/2020   PT End of Session - 11/11/20 1055    Visit Number 27    Number of Visits 32    Date for PT Re-Evaluation 11/23/20    PT Start Time 3159    PT Stop Time 1054    PT Time Calculation (min) 52 min    Activity Tolerance Patient tolerated treatment well    Behavior During Therapy [facility] for tasks assessed/performed           Past Medical History:  Diagnosis Date  . Allergic rhinitis   . Anemia   . Aortic valve disorders   . Arthritis   . Asthma   . Cancer [facility])    Breast Cancer  . CHF (congestive heart failure) ([place])   . Coronary artery disease   . Family history of adverse reaction to anesthesia    difficulty waking mother up after surgery  . Family history of breast cancer   . Family history of cervical cancer   . Family history of colon cancer   . Family history of throat cancer   . Heart murmur   . Hyperlipidemia   . Hypertension   . Long term (current) use of anticoagulants   . Other primary cardiomyopathies   . Peripheral vascular disease (HCC)    right leg  . Personal history of colonic polyps   . Personal history of venous thrombosis and embolism     Past Surgical History:  Procedure Laterality Date  . ABDOMINAL HYSTERECTOMY    . BREAST BIOPSY Left 07/2019  . CARDIAC CATHETERIZATION  2004  . COLON SURGERY     colonoscopy  . DILATION AND CURETTAGE OF UTERUS     several  . MASTECTOMY MODIFIED RADICAL Left 03/09/2020   Left Modified Radical Mastectomy (left mastectomy with axillary lymph node dissection)   . MASTECTOMY MODIFIED RADICAL Left 03/09/2020   Procedure: LEFT MODIFIED RADICAL MASTECTOMY;  Surgeon: Stark Klein, MD;   Location: [place];  Service: General;  Laterality: Left;  GEN AND PECTORAL BLOCK  . PORT-A-CATH REMOVAL N/A 08/31/2020   Procedure: REMOVAL PORT-A-CATH;  Surgeon: Stark Klein, MD;  Location: [place];  Service: General;  Laterality: N/A;  . PORTACATH PLACEMENT Left 08/18/2019   Procedure: INSERTION PORT-A-CATH;  Surgeon: Stark Klein, MD;  Location: [place];  Service: General;  Laterality: Left;  . Rt knee arthoscopic    . TEE WITHOUT CARDIOVERSION N/A 02/21/2013   Procedure: TRANSESOPHAGEAL ECHOCARDIOGRAM (TEE);  Surgeon: Lelon Perla, MD;  Location: [facility] ENDOSCOPY;  Service: Cardiovascular;  Laterality: N/A;    There were no vitals filed for this visit.   Subjective Assessment - 11/11/20 1000    Subjective Pt here with Shaune Spittle from lymphapress for trial on compression pumps    Pertinent History Patient was diagnosed on 07/24/2019 with left grade III invasive ductal carcinoma breast cancer. ER/PR negative and HER2 positive with a Ki67 of 70%. neoadjuvant chemotherapy completed.  Lt Mastectomy 03/09/20 with 19 axillary nodes removed and 2 inframammary nodes removed.  All negative.  Radiation consultation on 05/04/20.    Patient Stated Goals swelling down, more mobility, get radiation started    Currently in Pain? No/denies  Flowsheet Row Outpatient Rehab from 04/22/2020 in [place]  Lymphedema Life Impact Scale Total Score 38.24 %            [facility] Adult PT Treatment/Exercise - 11/11/20 0001      Manual Therapy   Edema Management pump trial x 45 minutes.  Pt does have history of CHF x 15 years ago but was okayed for trial by representative with cardiac clearance for pump delivery.  Pt wore basic pump x 39mnutes with small increase in UE size but also slight increase of the chest.  The advanced pump was worn x 163mutes with reduction of chest x .75cm and pt reports of the lateral trunk feeling much improved. Rep also went  over prices and was given prescription by oncologist to bring back to the office.                       PT Long Term Goals - 11/05/20 0944      PT LONG TERM GOAL #1   Title Pt will improve her Lt shoulder active flexion and abduction to at least 140 to improve overhead reaching    Baseline 50 / 60, 104/105 on 09/14/20, 115 on 11/05/20    Status Partially Met      PT LONG TERM GOAL #2   Title Pt will decrease circumferential measurements by at least 2cm globally proximal to the wrist to demonstrate improved Lt UE edema    Status On-going      PT LONG TERM GOAL #3   Title Pt will obtain appropriate compression garments for the Lt UE to manage lymphedema    Status Achieved      PT LONG TERM GOAL #4   Title Pt will be ind with final HEP    Status On-going                 Plan - 11/11/20 1056    Clinical Impression Statement PT present for lymphapress pump trial in clinic with pt having good chest reduction subjectively and objectively as well as reduction in the UE with 10-15 minute use.  Pt did not qualify for basic pump as her truncal edema increased with short use of this pump x 5-10 minutes.  Pt is educated on pump use and process and will consider final decision but is leaning towards getting one.  Pump may be the missing piece in her lymphedema self care.    PT Frequency 2x / week    PT Duration 4 weeks    PT Treatment/Interventions ADLs/Self Care Home Management;Therapeutic exercise;Patient/family education;Moist Heat;Therapeutic activities;Manual techniques;Joint Manipulations;Passive range of motion;Cryotherapy;Manual lymph drainage;Compression bandaging;Taping    PT Next Visit Plan any pump updates? Lt shoulder PROM, AAROM and Lt UE MLD, scapular strengthening    Consulted and Agree with Plan of Care Patient           Patient will benefit from skilled therapeutic intervention in order to improve the following deficits and impairments:     Visit  Diagnosis: Lymphedema  Malignant neoplasm of overlapping sites of left breast in female, estrogen receptor negative ([place]    Problem List Patient Active Problem List   Diagnosis Date Noted  . Genetic testing 11/01/2020  . Breast cancer metastasized to axillary lymph node, left ([place]06/05/2020  . Coagulopathy ([place]03/01/2020  . Epistaxis, recurrent 12/05/2019  . Dehydration   . Non-intractable vomiting   . Intractable nausea and vomiting 09/21/2019  . Generalized weakness 09/21/2019  .  AKI (acute kidney injury) ([place]) 09/21/2019  . Leukocytosis 09/21/2019  . Hypernatremia 09/21/2019  . Hyperchloremia 09/21/2019  . Thrombocytopenia ([place]) 09/21/2019  . Port-A-Cath in place 09/08/2019  . Family history of breast cancer   . Family history of throat cancer   . Family history of cervical cancer   . Malignant neoplasm of overlapping sites of left breast in female, estrogen receptor negative ([place]) 08/01/2019  . Family history of colon cancer mom 11/05/2018  . Encounter for therapeutic drug monitoring 11/21/2013  . Asthma with bronchitis 09/01/2013  . Chest pain 01/01/2013  . Aortic insufficiency 10/25/2011  . Chronic anticoagulation 09/29/2011  . Nocturnal dyspnea 09/29/2011  . VITAMIN D DEFICIENCY 08/06/2009  . DVT 01/26/2009  . KNEE PAIN, RIGHT 06/19/2008  . EDEMA 06/19/2008  . ANKLE PAIN, LEFT 02/13/2008  . HYPERKALEMIA 08/20/2007  . HYPOKALEMIA 08/20/2007  . HLD (hyperlipidemia) 07/18/2007  . Cardiomyopathy, hypertensive ([place]) 07/18/2007  . Essential hypertension 06/26/2007  . Hypertensive heart disease with heart failure ([place]) 06/26/2007  . Congestive heart failure ([place]) 06/26/2007  . Allergic rhinitis, cause unspecified 06/26/2007  . ASTHMA 06/26/2007  . SYMPTOM, SYNCOPE AND COLLAPSE 06/26/2007  . HEADACHE 06/26/2007  . HEART MURMUR, HX OF 06/26/2007  . DVT, HX OF 06/26/2007    Shan Levans, PT  11/11/2020, 10:59 AM  [place], [place], [address] Phone: [phone]   Fax:  [phone]  Name: [name] Foster MRN: [id number] Date of Birth: Jun 25, 1951

## 2020-11-16 ENCOUNTER — Encounter: Payer: Self-pay | Admitting: Rehabilitation

## 2020-11-16 ENCOUNTER — Ambulatory Visit: Payer: Medicare Other | Admitting: Rehabilitation

## 2020-11-16 ENCOUNTER — Other Ambulatory Visit: Payer: Self-pay

## 2020-11-16 DIAGNOSIS — M25612 Stiffness of left shoulder, not elsewhere classified: Secondary | ICD-10-CM | POA: Diagnosis not present

## 2020-11-16 DIAGNOSIS — I890 Lymphedema, not elsewhere classified: Secondary | ICD-10-CM | POA: Diagnosis not present

## 2020-11-16 DIAGNOSIS — Z171 Estrogen receptor negative status [ER-]: Secondary | ICD-10-CM

## 2020-11-16 DIAGNOSIS — R293 Abnormal posture: Secondary | ICD-10-CM

## 2020-11-16 DIAGNOSIS — C50812 Malignant neoplasm of overlapping sites of left female breast: Secondary | ICD-10-CM | POA: Diagnosis not present

## 2020-11-16 NOTE — Therapy (Signed)
[place], [place], [address] Phone: [phone]   Fax:  [phone]  Physical Therapy Treatment  Patient Details  Name: [name] Foster MRN: [id number] Date of Birth: Feb 28, 1951 Referring Provider (PT): Dr. Lindi Adie   Encounter Date: 11/16/2020   PT End of Session - 11/16/20 1149    Visit Number 28    Number of Visits 32    Date for PT Re-Evaluation 11/23/20    PT Start Time 1058    PT Stop Time 1149    PT Time Calculation (min) 51 min    Activity Tolerance Patient tolerated treatment well    Behavior During Therapy [facility] for tasks assessed/performed           Past Medical History:  Diagnosis Date  . Allergic rhinitis   . Anemia   . Aortic valve disorders   . Arthritis   . Asthma   . Cancer [facility])    Breast Cancer  . CHF (congestive heart failure) ([place])   . Coronary artery disease   . Family history of adverse reaction to anesthesia    difficulty waking mother up after surgery  . Family history of breast cancer   . Family history of cervical cancer   . Family history of colon cancer   . Family history of throat cancer   . Heart murmur   . Hyperlipidemia   . Hypertension   . Long term (current) use of anticoagulants   . Other primary cardiomyopathies   . Peripheral vascular disease (HCC)    right leg  . Personal history of colonic polyps   . Personal history of venous thrombosis and embolism     Past Surgical History:  Procedure Laterality Date  . ABDOMINAL HYSTERECTOMY    . BREAST BIOPSY Left 07/2019  . CARDIAC CATHETERIZATION  2004  . COLON SURGERY     colonoscopy  . DILATION AND CURETTAGE OF UTERUS     several  . MASTECTOMY MODIFIED RADICAL Left 03/09/2020   Left Modified Radical Mastectomy (left mastectomy with axillary lymph node dissection)   . MASTECTOMY MODIFIED RADICAL Left 03/09/2020   Procedure: LEFT MODIFIED RADICAL MASTECTOMY;  Surgeon: Stark Klein, MD;   Location: [place];  Service: General;  Laterality: Left;  GEN AND PECTORAL BLOCK  . PORT-A-CATH REMOVAL N/A 08/31/2020   Procedure: REMOVAL PORT-A-CATH;  Surgeon: Stark Klein, MD;  Location: [place];  Service: General;  Laterality: N/A;  . PORTACATH PLACEMENT Left 08/18/2019   Procedure: INSERTION PORT-A-CATH;  Surgeon: Stark Klein, MD;  Location: [place];  Service: General;  Laterality: Left;  . Rt knee arthoscopic    . TEE WITHOUT CARDIOVERSION N/A 02/21/2013   Procedure: TRANSESOPHAGEAL ECHOCARDIOGRAM (TEE);  Surgeon: Lelon Perla, MD;  Location: [facility] ENDOSCOPY;  Service: Cardiovascular;  Laterality: N/A;    There were no vitals filed for this visit.   Subjective Assessment - 11/16/20 1050    Subjective I have not heard anything from the pump people    Pertinent History Patient was diagnosed on 07/24/2019 with left grade III invasive ductal carcinoma breast cancer. ER/PR negative and HER2 positive with a Ki67 of 70%. neoadjuvant chemotherapy completed.  Lt Mastectomy 03/09/20 with 19 axillary nodes removed and 2 inframammary nodes removed.  All negative.  Radiation consultation on 05/04/20.    Currently in Pain? No/denies                     Flowsheet Row [facility]  Lymphedema Life Impact Scale Total Score 38.24 %            [facility] Adult PT Treatment/Exercise - 11/16/20 0001      Manual Therapy   Soft tissue mobilization to the left pectoralis, UT, and lat (P)     Manual Lymphatic Drainage (MLD) in supine: short neck, sternal and clavicular nodes, bil axillary nodes, and left inguinal nodes, then Lt UE from proximal to distal and in sidelying posterior interaxillary and axillo inguinal work to the trunk. (P)     Passive ROM to the left shoulder into flexion, abduction, ER at various angles and D2 to tolerance (P)                        PT Long Term Goals - 11/16/20 1054      PT LONG  TERM GOAL #1   Title Pt will improve her Lt shoulder active flexion and abduction to at least 140 to improve overhead reaching    Baseline 50 / 60, 104/105 on 09/14/20, 115 on 11/05/20                 Plan - 11/16/20 1149    Clinical Impression Statement Pt has not heard from pump yet will wait until thursday to follow up with Wille Glaser.  Pts arm feels softer today after more focus on the full MLD sequence.  Pt also noted decreased chest edema around 2 days post pump trial which is encouraging.  Cardiologist also okayed pump per inbox message.    PT Frequency 2x / week    PT Duration 4 weeks    PT Treatment/Interventions ADLs/Self Care Home Management;Therapeutic exercise;Patient/family education;Moist Heat;Therapeutic activities;Manual techniques;Joint Manipulations;Passive range of motion;Cryotherapy;Manual lymph drainage;Compression bandaging;Taping    PT Next Visit Plan any pump updates? Lt shoulder PROM, AAROM and Lt UE MLD, scapular strengthening    PT Home Exercise Plan focusing on HEP given 08/03/20, new HEP 10/12/20    Consulted and Agree with Plan of Care Patient           Patient will benefit from skilled therapeutic intervention in order to improve the following deficits and impairments:     Visit Diagnosis: Lymphedema  Malignant neoplasm of overlapping sites of left breast in female, estrogen receptor negative (HCC)  Abnormal posture  Stiffness of left shoulder, not elsewhere classified     Problem List Patient Active Problem List   Diagnosis Date Noted  . Genetic testing 11/01/2020  . Breast cancer metastasized to axillary lymph node, left ([place]) 03/09/2020  . Coagulopathy ([place]) 12/05/2019  . Epistaxis, recurrent 12/05/2019  . Dehydration   . Non-intractable vomiting   . Intractable nausea and vomiting 09/21/2019  . Generalized weakness 09/21/2019  . AKI (acute kidney injury) ([place]) 09/21/2019  . Leukocytosis 09/21/2019  . Hypernatremia 09/21/2019  .  Hyperchloremia 09/21/2019  . Thrombocytopenia ([place]) 09/21/2019  . Port-A-Cath in place 09/08/2019  . Family history of breast cancer   . Family history of throat cancer   . Family history of cervical cancer   . Malignant neoplasm of overlapping sites of left breast in female, estrogen receptor negative ([place]) 08/01/2019  . Family history of colon cancer mom 11/05/2018  . Encounter for therapeutic drug monitoring 11/21/2013  . Asthma with bronchitis 09/01/2013  . Chest pain 01/01/2013  . Aortic insufficiency 10/25/2011  . Chronic anticoagulation 09/29/2011  . Nocturnal dyspnea 09/29/2011  . VITAMIN D DEFICIENCY 08/06/2009  . DVT  01/26/2009  . KNEE PAIN, RIGHT 06/19/2008  . EDEMA 06/19/2008  . ANKLE PAIN, LEFT 02/13/2008  . HYPERKALEMIA 08/20/2007  . HYPOKALEMIA 08/20/2007  . HLD (hyperlipidemia) 07/18/2007  . Cardiomyopathy, hypertensive ([place]) 07/18/2007  . Essential hypertension 06/26/2007  . Hypertensive heart disease with heart failure ([place]) 06/26/2007  . Congestive heart failure ([place]) 06/26/2007  . Allergic rhinitis, cause unspecified 06/26/2007  . ASTHMA 06/26/2007  . SYMPTOM, SYNCOPE AND COLLAPSE 06/26/2007  . HEADACHE 06/26/2007  . HEART MURMUR, HX OF 06/26/2007  . DVT, HX OF 06/26/2007    Stark Bray 11/16/2020, 11:51 AM  [place], [place], [address] Phone: [phone]   Fax:  [phone]  Name: [name] Foster MRN: [id number] Date of Birth: [birth year]

## 2020-11-18 ENCOUNTER — Other Ambulatory Visit: Payer: Self-pay

## 2020-11-18 ENCOUNTER — Encounter: Payer: Self-pay | Admitting: Rehabilitation

## 2020-11-18 ENCOUNTER — Ambulatory Visit: Payer: Medicare Other | Admitting: Rehabilitation

## 2020-11-18 DIAGNOSIS — C50812 Malignant neoplasm of overlapping sites of left female breast: Secondary | ICD-10-CM | POA: Diagnosis not present

## 2020-11-18 DIAGNOSIS — M25612 Stiffness of left shoulder, not elsewhere classified: Secondary | ICD-10-CM

## 2020-11-18 DIAGNOSIS — I890 Lymphedema, not elsewhere classified: Secondary | ICD-10-CM

## 2020-11-18 DIAGNOSIS — R293 Abnormal posture: Secondary | ICD-10-CM | POA: Diagnosis not present

## 2020-11-18 DIAGNOSIS — Z171 Estrogen receptor negative status [ER-]: Secondary | ICD-10-CM | POA: Diagnosis not present

## 2020-11-18 NOTE — Therapy (Signed)
[place], [place], [address] Phone: [phone]   Fax:  [phone]  Physical Therapy Treatment  Patient Details  Name: [name] Foster MRN: [id number] Date of Birth: [birth year] Referring Provider (PT): Dr. Lindi Adie   Encounter Date: 11/18/2020   PT End of Session - 11/18/20 1156    Visit Number 29    Number of Visits 37    Date for PT Re-Evaluation 12/16/20    PT Start Time 1100    PT Stop Time 1158    PT Time Calculation (min) 58 min    Activity Tolerance Patient tolerated treatment well    Behavior During Therapy [facility] for tasks assessed/performed           Past Medical History:  Diagnosis Date  . Allergic rhinitis   . Anemia   . Aortic valve disorders   . Arthritis   . Asthma   . Cancer [facility])    Breast Cancer  . CHF (congestive heart failure) ([place])   . Coronary artery disease   . Family history of adverse reaction to anesthesia    difficulty waking mother up after surgery  . Family history of breast cancer   . Family history of cervical cancer   . Family history of colon cancer   . Family history of throat cancer   . Heart murmur   . Hyperlipidemia   . Hypertension   . Long term (current) use of anticoagulants   . Other primary cardiomyopathies   . Peripheral vascular disease (HCC)    right leg  . Personal history of colonic polyps   . Personal history of venous thrombosis and embolism     Past Surgical History:  Procedure Laterality Date  . ABDOMINAL HYSTERECTOMY    . BREAST BIOPSY Left 07/2019  . CARDIAC CATHETERIZATION  2004  . COLON SURGERY     colonoscopy  . DILATION AND CURETTAGE OF UTERUS     several  . MASTECTOMY MODIFIED RADICAL Left 03/09/2020   Left Modified Radical Mastectomy (left mastectomy with axillary lymph node dissection)   . MASTECTOMY MODIFIED RADICAL Left 03/09/2020   Procedure: LEFT MODIFIED RADICAL MASTECTOMY;  Surgeon: [name] Klein, MD;   Location: [place];  Service: General;  Laterality: Left;  GEN AND PECTORAL BLOCK  . PORT-A-CATH REMOVAL N/A 08/31/2020   Procedure: REMOVAL PORT-A-CATH;  Surgeon: [name] Klein, MD;  Location: [place];  Service: General;  Laterality: N/A;  . PORTACATH PLACEMENT Left 08/18/2019   Procedure: INSERTION PORT-A-CATH;  Surgeon: [name] Klein, MD;  Location: [place];  Service: General;  Laterality: Left;  . Rt knee arthoscopic    . TEE WITHOUT CARDIOVERSION N/A 02/21/2013   Procedure: TRANSESOPHAGEAL ECHOCARDIOGRAM (TEE);  Surgeon: Lelon Perla, MD;  Location: [facility] ENDOSCOPY;  Service: Cardiovascular;  Laterality: N/A;    There were no vitals filed for this visit.   Subjective Assessment - 11/18/20 1100    Subjective Nothing new.  still no pump call    Pertinent History Patient was diagnosed on 07/24/2019 with left grade III invasive ductal carcinoma breast cancer. ER/PR negative and HER2 positive with a Ki67 of 70%. neoadjuvant chemotherapy completed.  Lt Mastectomy 03/09/20 with 19 axillary nodes removed and 2 inframammary nodes removed.  All negative.  Radiation consultation on 05/04/20.    Currently in Pain? No/denies                 LYMPHEDEMA/ONCOLOGY QUESTIONNAIRE - 11/18/20 0001      Left Upper Extremity  Lymphedema   At Axilla  --    15 cm Proximal to Olecranon Process 42.5 cm    10 cm Proximal to Olecranon Process 39.7 cm    Olecranon Process 32.3 cm    15 cm Proximal to Ulnar Styloid Process 30.5 cm    10 cm Proximal to Ulnar Styloid Process 27.4 cm    Just Proximal to Ulnar Styloid Process 20.1 cm    Across Hand at [employer] 20.1 cm    At [place] of 2nd Digit 6.2 cm              Flowsheet Row Outpatient [facility]  Lymphedema Life Impact Scale Total Score 38.24 %            [facility] Adult PT Treatment/Exercise - 11/18/20 0001      Manual Therapy   Edema Management gave pt ordering information for night  garment circaid as well as rechecked goals    Soft tissue mobilization to the left pectoralis, UT, and lat    Manual Lymphatic Drainage (MLD) in supine: short neck, sternal and clavicular nodes, bil axillary nodes, and left inguinal nodes, then Lt UE from proximal to distal and in sidelying posterior interaxillary and axillo inguinal work to the trunk.    Passive ROM to the left shoulder into flexion, abduction, ER at various angles and D2 to tolerance                       PT Long Term Goals - 11/18/20 1104      PT LONG TERM GOAL #1   Title Pt will improve her Lt shoulder active flexion and abduction to at least 140 to improve overhead reaching    Baseline 50 / 60, 104/105 on 09/14/20, 115 on 11/05/20, on 11/18/20: Flex: 105 tight in the chest and abduction: Abd: 90    Status On-going      PT LONG TERM GOAL #2   Title Pt will decrease circumferential measurements by at least 2cm globally proximal to the wrist to demonstrate improved Lt UE edema    Baseline see notes    Status On-going      PT LONG TERM GOAL #3   Title Pt will obtain appropriate compression garments for the Lt UE to manage lymphedema    Status Achieved                 Plan - 11/18/20 1158    Clinical Impression Statement Pt has not heard from pump company and Wille Glaser was emailed today to check status.  Assessed goals extended POC and will continue with Left UE MLD, STM, and PROM until ind with pump use and self program.    PT Frequency 2x / week    PT Duration 4 weeks    PT Treatment/Interventions ADLs/Self Care Home Management;Therapeutic exercise;Patient/family education;Moist Heat;Therapeutic activities;Manual techniques;Joint Manipulations;Passive range of motion;Cryotherapy;Manual lymph drainage;Compression bandaging;Taping    PT Next Visit Plan any pump updates? Lt shoulder PROM, AAROM and Lt UE MLD, scapular strengthening    Consulted and Agree with Plan of Care Patient           Patient will  benefit from skilled therapeutic intervention in order to improve the following deficits and impairments:     Visit Diagnosis: Lymphedema  Malignant neoplasm of overlapping sites of left breast in female, estrogen receptor negative (HCC)  Abnormal posture  Stiffness of left shoulder, not elsewhere classified  Problem List Patient Active Problem List   Diagnosis Date Noted  . Genetic testing 11/01/2020  . Breast cancer metastasized to axillary lymph node, left ([place]) 03/09/2020  . Coagulopathy ([place]) 12/05/2019  . Epistaxis, recurrent 12/05/2019  . Dehydration   . Non-intractable vomiting   . Intractable nausea and vomiting 09/21/2019  . Generalized weakness 09/21/2019  . AKI (acute kidney injury) ([place]) 09/21/2019  . Leukocytosis 09/21/2019  . Hypernatremia 09/21/2019  . Hyperchloremia 09/21/2019  . Thrombocytopenia ([place]) 09/21/2019  . Port-A-Cath in place 09/08/2019  . Family history of breast cancer   . Family history of throat cancer   . Family history of cervical cancer   . Malignant neoplasm of overlapping sites of left breast in female, estrogen receptor negative ([place]) 08/01/2019  . Family history of colon cancer mom 11/05/2018  . Encounter for therapeutic drug monitoring 11/21/2013  . Asthma with bronchitis 09/01/2013  . Chest pain 01/01/2013  . Aortic insufficiency 10/25/2011  . Chronic anticoagulation 09/29/2011  . Nocturnal dyspnea 09/29/2011  . VITAMIN D DEFICIENCY 08/06/2009  . DVT 01/26/2009  . KNEE PAIN, RIGHT 06/19/2008  . EDEMA 06/19/2008  . ANKLE PAIN, LEFT 02/13/2008  . HYPERKALEMIA 08/20/2007  . HYPOKALEMIA 08/20/2007  . HLD (hyperlipidemia) 07/18/2007  . Cardiomyopathy, hypertensive ([place]) 07/18/2007  . Essential hypertension 06/26/2007  . Hypertensive heart disease with heart failure ([place]) 06/26/2007  . Congestive heart failure ([place]) 06/26/2007  . Allergic rhinitis, cause unspecified 06/26/2007  . ASTHMA 06/26/2007  . SYMPTOM, SYNCOPE AND  COLLAPSE 06/26/2007  . HEADACHE 06/26/2007  . HEART MURMUR, HX OF 06/26/2007  . DVT, HX OF 06/26/2007    [name] Foster 11/18/2020, 12:00 PM  [place], [place], [address] Phone: [phone]   Fax:  [phone]  Name: [name] Foster MRN: [id number] Date of Birth: 1951/01/03

## 2020-11-23 ENCOUNTER — Ambulatory Visit: Payer: Medicare Other

## 2020-11-23 ENCOUNTER — Other Ambulatory Visit: Payer: Self-pay

## 2020-11-23 DIAGNOSIS — C50812 Malignant neoplasm of overlapping sites of left female breast: Secondary | ICD-10-CM | POA: Diagnosis not present

## 2020-11-23 DIAGNOSIS — R293 Abnormal posture: Secondary | ICD-10-CM | POA: Diagnosis not present

## 2020-11-23 DIAGNOSIS — M25612 Stiffness of left shoulder, not elsewhere classified: Secondary | ICD-10-CM

## 2020-11-23 DIAGNOSIS — I890 Lymphedema, not elsewhere classified: Secondary | ICD-10-CM | POA: Diagnosis not present

## 2020-11-23 DIAGNOSIS — Z171 Estrogen receptor negative status [ER-]: Secondary | ICD-10-CM | POA: Diagnosis not present

## 2020-11-23 NOTE — Therapy (Signed)
[place], [place], [address] Phone: [phone]   Fax:  [phone]  Physical Therapy Treatment  Patient Details  Name: [name] Foster MRN: [id number] Date of Birth: 05/04/1951 Referring Provider (PT): Dr. Lindi Adie   Encounter Date: 11/23/2020   PT End of Session - 11/23/20 1010    Visit Number 30    Number of Visits 37    Date for PT Re-Evaluation 12/16/20    PT Start Time 0907    PT Stop Time 1007    PT Time Calculation (min) 60 min    Activity Tolerance Patient tolerated treatment well    Behavior During Therapy [facility] for tasks assessed/performed           Past Medical History:  Diagnosis Date  . Allergic rhinitis   . Anemia   . Aortic valve disorders   . Arthritis   . Asthma   . Cancer [facility])    Breast Cancer  . CHF (congestive heart failure) ([place])   . Coronary artery disease   . Family history of adverse reaction to anesthesia    difficulty waking mother up after surgery  . Family history of breast cancer   . Family history of cervical cancer   . Family history of colon cancer   . Family history of throat cancer   . Heart murmur   . Hyperlipidemia   . Hypertension   . Long term (current) use of anticoagulants   . Other primary cardiomyopathies   . Peripheral vascular disease (HCC)    right leg  . Personal history of colonic polyps   . Personal history of venous thrombosis and embolism     Past Surgical History:  Procedure Laterality Date  . ABDOMINAL HYSTERECTOMY    . BREAST BIOPSY Left 07/2019  . CARDIAC CATHETERIZATION  2004  . COLON SURGERY     colonoscopy  . DILATION AND CURETTAGE OF UTERUS     several  . MASTECTOMY MODIFIED RADICAL Left 03/09/2020   Left Modified Radical Mastectomy (left mastectomy with axillary lymph node dissection)   . MASTECTOMY MODIFIED RADICAL Left 03/09/2020   Procedure: LEFT MODIFIED RADICAL MASTECTOMY;  Surgeon: Stark Klein, MD;   Location: [place];  Service: General;  Laterality: Left;  GEN AND PECTORAL BLOCK  . PORT-A-CATH REMOVAL N/A 08/31/2020   Procedure: REMOVAL PORT-A-CATH;  Surgeon: Stark Klein, MD;  Location: [place];  Service: General;  Laterality: N/A;  . PORTACATH PLACEMENT Left 08/18/2019   Procedure: INSERTION PORT-A-CATH;  Surgeon: Stark Klein, MD;  Location: [place];  Service: General;  Laterality: Left;  . Rt knee arthoscopic    . TEE WITHOUT CARDIOVERSION N/A 02/21/2013   Procedure: TRANSESOPHAGEAL ECHOCARDIOGRAM (TEE);  Surgeon: Lelon Perla, MD;  Location[facility] END[facility];  Service: Cardiovascular;  Laterality: N/A;    There were no vitals filed for this visit.   Subjective Assessment - 11/23/20 0909    Subjective My velcro garment is doing well and I've been wearing that every day. I got a call from Jon from the [facility] and he said they are just waiting on the [facility] to be returned from my doctor. The front of my elbow is starting to feel a little irritated from the [facility] when I wear it.    Pertinent History Patient was diagnosed on 07/24/2019 with left grade III invasive ductal carcinoma breast cancer. ER/PR negative and HER2 positive with a Ki67 of 70%. neoadjuvant chemotherapy completed.  Lt Mastectomy 03/09/20 with 19 axillary  nodes removed and 2 inframammary nodes removed.  All negative.  Radiation consultation on 05/04/20.    Patient Stated Goals swelling down, more mobility, get radiation started    Currently in Pain? No/denies                     Flowsheet Row Outpatient Rehab from 04/22/2020 in [place]  Lymphedema Life Impact Scale Total Score 38.24 %            [facility] Adult PT Treatment/Exercise - 11/23/20 0001      Manual Therapy   Edema Management Assisted pt with donning her velcro compression garment at end of session placing new 1/2" gray foam in TG soft at antecubital fossa to see if this decreases new tenderness here     Soft tissue mobilization to the left pectoralis, UT, and lat    Manual Lymphatic Drainage (MLD) in supine: short neck, sternal and clavicular nodes, Rt axillary nodes, and left inguinal nodes, then Lt UE from proximal to distal    Passive ROM to the left shoulder into flexion and abduction, at various angles to pts tolerance with scapular depression throughout                       PT Long Term Goals - 11/18/20 1104      PT LONG TERM GOAL #1   Title Pt will improve her Lt shoulder active flexion and abduction to at least 140 to improve overhead reaching    Baseline 50 / 60, 104/105 on 09/14/20, 115 on 11/05/20, on 11/18/20: Flex: 105 tight in the chest and abduction: Abd: 90    Status On-going      PT LONG TERM GOAL #2   Title Pt will decrease circumferential measurements by at least 2cm globally proximal to the wrist to demonstrate improved Lt UE edema    Baseline see notes    Status On-going      PT LONG TERM GOAL #3   Title Pt will obtain appropriate compression garments for the Lt UE to manage lymphedema    Status Achieved                 Plan - 11/23/20 1306    Clinical Impression Statement Pt has now heard from [place] and they report are just waiting for [facility] from her doctor. Continued with P/ROM of Lt shoulder and STM as she is still mod limited with end motions. Also continued with MLD to Lt UE. Pt has not ordered nighttime garment yet but plans to soon . Issued 1/2" gray foam in TG soft for pt to wear at antecubital fossa where she repors new tenderness when wearing [employer] that gets exacrebated from velcro garment throughout day.    Personal Factors and Comorbidities Age;Finances;Comorbidity 1    Comorbidities ALND,    Examination-Activity Limitations Dressing;Sleep;Reach Overhead;Carry    Examination-Participation Restrictions Yard Work;Cleaning;Laundry;Community Activity    Stability/Clinical Decision Making Stable/Uncomplicated    Rehab Potential  Excellent    PT Frequency 2x / week    PT Duration 4 weeks    PT Treatment/Interventions ADLs/Self Care Home Management;Therapeutic exercise;Patient/family education;Moist Heat;Therapeutic activities;Manual techniques;Joint Manipulations;Passive range of motion;Cryotherapy;Manual lymph drainage;Compression bandaging;Taping    PT Next Visit Plan any new pump updates? how was foam at antecubital fossa? Lt shoulder PROM, AAROM and Lt UE MLD, scapular strengthening    PT Home Exercise Plan focusing on HEP given 08/03/20, new HEP 10/12/20  Consulted and Agree with Plan of Care Patient           Patient will benefit from skilled therapeutic intervention in order to improve the following deficits and impairments:  Postural dysfunction,Decreased knowledge of precautions,Impaired UE functional use,Pain,Decreased range of motion,Decreased strength,Increased edema  Visit Diagnosis: Lymphedema  Malignant neoplasm of overlapping sites of left breast in female, estrogen receptor negative (HCC)  Abnormal posture  Stiffness of left shoulder, not elsewhere classified     Problem List Patient Active Problem List   Diagnosis Date Noted  . Genetic testing 11/01/2020  . Breast cancer metastasized to axillary lymph node, left ([place]) 03/09/2020  . Coagulopathy ([place]) 12/05/2019  . Epistaxis, recurrent 12/05/2019  . Dehydration   . Non-intractable vomiting   . Intractable nausea and vomiting 09/21/2019  . Generalized weakness 09/21/2019  . AKI (acute kidney injury) ([place]) 09/21/2019  . Leukocytosis 09/21/2019  . Hypernatremia 09/21/2019  . Hyperchloremia 09/21/2019  . Thrombocytopenia ([place]) 09/21/2019  . Port-A-Cath in place 09/08/2019  . Family history of breast cancer   . Family history of throat cancer   . Family history of cervical cancer   . Malignant neoplasm of overlapping sites of left breast in female, estrogen receptor negative ([place]) 08/01/2019  . Family history of colon cancer mom  11/05/2018  . Encounter for therapeutic drug monitoring 11/21/2013  . Asthma with bronchitis 09/01/2013  . Chest pain 01/01/2013  . Aortic insufficiency 10/25/2011  . Chronic anticoagulation 09/29/2011  . Nocturnal dyspnea 09/29/2011  . VITAMIN D DEFICIENCY 08/06/2009  . DVT 01/26/2009  . KNEE PAIN, RIGHT 06/19/2008  . EDEMA 06/19/2008  . ANKLE PAIN, LEFT 02/13/2008  . HYPERKALEMIA 08/20/2007  . HYPOKALEMIA 08/20/2007  . HLD (hyperlipidemia) 07/18/2007  . Cardiomyopathy, hypertensive ([place]) 07/18/2007  . Essential hypertension 06/26/2007  . Hypertensive heart disease with heart failure ([place]) 06/26/2007  . Congestive heart failure ([place]) 06/26/2007  . Allergic rhinitis, cause unspecified 06/26/2007  . ASTHMA 06/26/2007  . SYMPTOM, SYNCOPE AND COLLAPSE 06/26/2007  . HEADACHE 06/26/2007  . HEART MURMUR, HX OF 06/26/2007  . DVT, HX OF 06/26/2007    Otelia Limes, PTA 11/23/2020, 1:10 PM  [place], [place], [address] Phone: [phone]   Fax:  [phone]  Name: [name] Foster MRN: [id number] Date of Birth: July 28, 1951

## 2020-11-25 ENCOUNTER — Other Ambulatory Visit: Payer: Self-pay

## 2020-11-25 ENCOUNTER — Ambulatory Visit: Payer: Medicare Other | Admitting: Rehabilitation

## 2020-11-25 ENCOUNTER — Encounter: Payer: Self-pay | Admitting: Rehabilitation

## 2020-11-25 DIAGNOSIS — R293 Abnormal posture: Secondary | ICD-10-CM | POA: Diagnosis not present

## 2020-11-25 DIAGNOSIS — M25612 Stiffness of left shoulder, not elsewhere classified: Secondary | ICD-10-CM

## 2020-11-25 DIAGNOSIS — C50812 Malignant neoplasm of overlapping sites of left female breast: Secondary | ICD-10-CM

## 2020-11-25 DIAGNOSIS — I890 Lymphedema, not elsewhere classified: Secondary | ICD-10-CM | POA: Diagnosis not present

## 2020-11-25 DIAGNOSIS — Z171 Estrogen receptor negative status [ER-]: Secondary | ICD-10-CM

## 2020-11-25 NOTE — Therapy (Signed)
[place], [place], [address] Phone: [phone]   Fax:  [phone]  Physical Therapy Treatment  Patient Details  Name: [name] Foster MRN: [id number] Date of Birth: 1951/04/30 Referring Provider (PT): Dr. Lindi Adie   Encounter Date: 11/25/2020   PT End of Session - 11/25/20 0947    Visit Number 31    Number of Visits 37    Date for PT Re-Evaluation 12/16/20    PT Start Time 0902    PT Stop Time 0953    PT Time Calculation (min) 51 min    Activity Tolerance Patient tolerated treatment well    Behavior During Therapy [facility] for tasks assessed/performed           Past Medical History:  Diagnosis Date  . Allergic rhinitis   . Anemia   . Aortic valve disorders   . Arthritis   . Asthma   . Cancer [facility])    Breast Cancer  . CHF (congestive heart failure) ([place])   . Coronary artery disease   . Family history of adverse reaction to anesthesia    difficulty waking mother up after surgery  . Family history of breast cancer   . Family history of cervical cancer   . Family history of colon cancer   . Family history of throat cancer   . Heart murmur   . Hyperlipidemia   . Hypertension   . Long term (current) use of anticoagulants   . Other primary cardiomyopathies   . Peripheral vascular disease (HCC)    right leg  . Personal history of colonic polyps   . Personal history of venous thrombosis and embolism     Past Surgical History:  Procedure Laterality Date  . ABDOMINAL HYSTERECTOMY    . BREAST BIOPSY Left 07/2019  . CARDIAC CATHETERIZATION  2004  . COLON SURGERY     colonoscopy  . DILATION AND CURETTAGE OF UTERUS     several  . MASTECTOMY MODIFIED RADICAL Left 03/09/2020   Left Modified Radical Mastectomy (left mastectomy with axillary lymph node dissection)   . MASTECTOMY MODIFIED RADICAL Left 03/09/2020   Procedure: LEFT MODIFIED RADICAL MASTECTOMY;  Surgeon: Stark Klein, MD;   Location: [place];  Service: General;  Laterality: Left;  GEN AND PECTORAL BLOCK  . PORT-A-CATH REMOVAL N/A 08/31/2020   Procedure: REMOVAL PORT-A-CATH;  Surgeon: Stark Klein, MD;  Location: [place];  Service: General;  Laterality: N/A;  . PORTACATH PLACEMENT Left 08/18/2019   Procedure: INSERTION PORT-A-CATH;  Surgeon: Stark Klein, MD;  Location: [place];  Service: General;  Laterality: Left;  . Rt knee arthoscopic    . TEE WITHOUT CARDIOVERSION N/A 02/21/2013   Procedure: TRANSESOPHAGEAL ECHOCARDIOGRAM (TEE);  Surgeon: Lelon Perla, MD;  Location: [facility] ENDOSCOPY;  Service: Cardiovascular;  Laterality: N/A;    There were no vitals filed for this visit.   Subjective Assessment - 11/25/20 0902    Subjective The foam helped a bit.  The velcro garment is fine just the exostrong can bother me    Pertinent History Patient was diagnosed on 07/24/2019 with left grade III invasive ductal carcinoma breast cancer. ER/PR negative and HER2 positive with a Ki67 of 70%. neoadjuvant chemotherapy completed.  Lt Mastectomy 03/09/20 with 19 axillary nodes removed and 2 inframammary nodes removed.  All negative.  Radiation consultation on 05/04/20.    Currently in Pain? No/denies  Flowsheet Row Outpatient Rehab from 04/22/2020 in [place]  Lymphedema Life Impact Scale Total Score 38.24 %            [facility] Adult PT Treatment/Exercise - 11/25/20 0001      Manual Therapy   Soft tissue mobilization to the left pectoralis, UT, and lat    Manual Lymphatic Drainage (MLD) in supine: short neck, sternal and clavicular nodes, Rt axillary nodes, and left inguinal nodes, then Lt UE from proximal to distal    Passive ROM to the left shoulder into flexion and abduction, at various angles to pts tolerance with scapular depression throughout                       PT Long Term Goals - 11/18/20 1104      PT LONG TERM GOAL #1   Title Pt  will improve her Lt shoulder active flexion and abduction to at least 140 to improve overhead reaching    Baseline 50 / 60, 104/105 on 09/14/20, 115 on 11/05/20, on 11/18/20: Flex: 105 tight in the chest and abduction: Abd: 90    Status On-going      PT LONG TERM GOAL #2   Title Pt will decrease circumferential measurements by at least 2cm globally proximal to the wrist to demonstrate improved Lt UE edema    Baseline see notes    Status On-going      PT LONG TERM GOAL #3   Title Pt will obtain appropriate compression garments for the Lt UE to manage lymphedema    Status Achieved                 Plan - 11/25/20 0948    Clinical Impression Statement Pt continues to benefit from PT sessions and is waiting on pump delivery to be ind with self care.    PT Frequency 2x / week    PT Duration 4 weeks    PT Treatment/Interventions ADLs/Self Care Home Management;Therapeutic exercise;Patient/family education;Moist Heat;Therapeutic activities;Manual techniques;Joint Manipulations;Passive range of motion;Cryotherapy;Manual lymph drainage;Compression bandaging;Taping    PT Next Visit Plan any new pump updates? Lt shoulder PROM, AAROM and Lt UE MLD, scapular strengthening    Consulted and Agree with Plan of Care Patient           Patient will benefit from skilled therapeutic intervention in order to improve the following deficits and impairments:     Visit Diagnosis: Lymphedema  Malignant neoplasm of overlapping sites of left breast in female, estrogen receptor negative (HCC)  Abnormal posture  Stiffness of left shoulder, not elsewhere classified     Problem List Patient Active Problem List   Diagnosis Date Noted  . Genetic testing 11/01/2020  . Breast cancer metastasized to axillary lymph node, left ([place]) 03/09/2020  . Coagulopathy ([place]) 12/05/2019  . Epistaxis, recurrent 12/05/2019  . Dehydration   . Non-intractable vomiting   . Intractable nausea and vomiting 09/21/2019  .  Generalized weakness 09/21/2019  . AKI (acute kidney injury) ([place]) 09/21/2019  . Leukocytosis 09/21/2019  . Hypernatremia 09/21/2019  . Hyperchloremia 09/21/2019  . Thrombocytopenia ([place]) 09/21/2019  . Port-A-Cath in place 09/08/2019  . Family history of breast cancer   . Family history of throat cancer   . Family history of cervical cancer   . Malignant neoplasm of overlapping sites of left breast in female, estrogen receptor negative ([place]) 08/01/2019  . Family history of colon cancer mom 11/05/2018  . Encounter for therapeutic drug monitoring 11/21/2013  .  Asthma with bronchitis 09/01/2013  . Chest pain 01/01/2013  . Aortic insufficiency 10/25/2011  . Chronic anticoagulation 09/29/2011  . Nocturnal dyspnea 09/29/2011  . VITAMIN D DEFICIENCY 08/06/2009  . DVT 01/26/2009  . KNEE PAIN, RIGHT 06/19/2008  . EDEMA 06/19/2008  . ANKLE PAIN, LEFT 02/13/2008  . HYPERKALEMIA 08/20/2007  . HYPOKALEMIA 08/20/2007  . HLD (hyperlipidemia) 07/18/2007  . Cardiomyopathy, hypertensive ([place]) 07/18/2007  . Essential hypertension 06/26/2007  . Hypertensive heart disease with heart failure ([place]) 06/26/2007  . Congestive heart failure ([place]) 06/26/2007  . Allergic rhinitis, cause unspecified 06/26/2007  . ASTHMA 06/26/2007  . SYMPTOM, SYNCOPE AND COLLAPSE 06/26/2007  . HEADACHE 06/26/2007  . HEART MURMUR, HX OF 06/26/2007  . DVT, HX OF 06/26/2007    Stark Bray 11/25/2020, 9:49 AM  [place], [place], [address] Phone: [phone]   Fax:  [phone]  Name: [name] Foster MRN: [id number] Date of Birth: August 02, 1951

## 2020-12-06 ENCOUNTER — Other Ambulatory Visit: Payer: Self-pay

## 2020-12-06 ENCOUNTER — Encounter: Payer: Self-pay | Admitting: Podiatry

## 2020-12-06 ENCOUNTER — Ambulatory Visit (INDEPENDENT_AMBULATORY_CARE_PROVIDER_SITE_OTHER): Payer: Medicare Other | Admitting: Podiatry

## 2020-12-06 DIAGNOSIS — L600 Ingrowing nail: Secondary | ICD-10-CM

## 2020-12-06 DIAGNOSIS — G629 Polyneuropathy, unspecified: Secondary | ICD-10-CM

## 2020-12-06 DIAGNOSIS — D689 Coagulation defect, unspecified: Secondary | ICD-10-CM

## 2020-12-06 DIAGNOSIS — M79675 Pain in left toe(s): Secondary | ICD-10-CM

## 2020-12-06 DIAGNOSIS — L03039 Cellulitis of unspecified toe: Secondary | ICD-10-CM

## 2020-12-06 DIAGNOSIS — M79674 Pain in right toe(s): Secondary | ICD-10-CM

## 2020-12-06 DIAGNOSIS — B351 Tinea unguium: Secondary | ICD-10-CM

## 2020-12-06 NOTE — Patient Instructions (Signed)
EPSOM SALT FOOT SOAK INSTRUCTIONS  *IF YOU HAVE BEEN PRESCRIBED ANTIBIOTICS, TAKE AS INSTRUCTED UNTIL ALL ARE GONE*  Shopping List:  A. Plain epsom salt (not scented) B. Neosporin Cream/Ointment or Bacitracin Cream/Ointment (or prescribed antiobiotic drops/cream/ointment) C. 1-inch fabric band-aids  1.  Place 1/4 cup of epsom salts in 2 quarts of warm tap water. IF YOU ARE DIABETIC, OR HAVE NEUROPATHY, CHECK THE TEMPERATURE OF THE WATER WITH YOUR ELBOW.  2.  Submerge your foot/feet in the solution and soak for 10-15 minutes.      3.  Next, remove your foot/feet from solution, blot dry the affected area.    4.  Apply light amount of antibiotic cream/ointment and cover with fabric band-aid .  5.  This soak should be done once a day for 14 days.   6.  Monitor for any signs/symptoms of infection such as redness, swelling, odor, drainage, increased pain, or non-healing of digit.   7.  Please do not hesitate to call the office and speak to a Nurse or Doctor if you have questions.   8.  If you experience fever, chills, nightsweats, nausea or vomiting with worsening of digit, please go to the emergency room.

## 2020-12-07 ENCOUNTER — Ambulatory Visit: Payer: Medicare Other | Attending: Hematology and Oncology | Admitting: Rehabilitation

## 2020-12-07 ENCOUNTER — Encounter: Payer: Self-pay | Admitting: Rehabilitation

## 2020-12-07 DIAGNOSIS — Z171 Estrogen receptor negative status [ER-]: Secondary | ICD-10-CM | POA: Diagnosis not present

## 2020-12-07 DIAGNOSIS — C50812 Malignant neoplasm of overlapping sites of left female breast: Secondary | ICD-10-CM | POA: Diagnosis not present

## 2020-12-07 DIAGNOSIS — I890 Lymphedema, not elsewhere classified: Secondary | ICD-10-CM | POA: Diagnosis not present

## 2020-12-07 DIAGNOSIS — R293 Abnormal posture: Secondary | ICD-10-CM | POA: Diagnosis not present

## 2020-12-07 DIAGNOSIS — M25612 Stiffness of left shoulder, not elsewhere classified: Secondary | ICD-10-CM | POA: Insufficient documentation

## 2020-12-07 NOTE — Therapy (Signed)
[place], [place], [address] Phone: [phone]   Fax:  [phone]  Physical Therapy Treatment  Patient Details  Name: [name] Foster MRN: [id number] Date of Birth: March 15, 1951 Referring Provider (PT): Dr. Lindi Adie   Encounter Date: 12/07/2020   PT End of Session - 12/07/20 0954    Visit Number 32    Number of Visits 37    Date for PT Re-Evaluation 12/16/20    Authorization Type [employer] medicare    PT Start Time 1002    PT Stop Time 1055    PT Time Calculation (min) 53 min    Activity Tolerance Patient tolerated treatment well    Behavior During Therapy [facility] for tasks assessed/performed           Past Medical History:  Diagnosis Date  . Allergic rhinitis   . Anemia   . Aortic valve disorders   . Arthritis   . Asthma   . Cancer [facility])    Breast Cancer  . CHF (congestive heart failure) ([place])   . Coronary artery disease   . Family history of adverse reaction to anesthesia    difficulty waking mother up after surgery  . Family history of breast cancer   . Family history of cervical cancer   . Family history of colon cancer   . Family history of throat cancer   . Heart murmur   . Hyperlipidemia   . Hypertension   . Long term (current) use of anticoagulants   . Other primary cardiomyopathies   . Peripheral vascular disease (HCC)    right leg  . Personal history of colonic polyps   . Personal history of venous thrombosis and embolism     Past Surgical History:  Procedure Laterality Date  . ABDOMINAL HYSTERECTOMY    . BREAST BIOPSY Left 07/2019  . CARDIAC CATHETERIZATION  2004  . COLON SURGERY     colonoscopy  . DILATION AND CURETTAGE OF UTERUS     several  . MASTECTOMY MODIFIED RADICAL Left 03/09/2020   Left Modified Radical Mastectomy (left mastectomy with axillary lymph node dissection)   . MASTECTOMY MODIFIED RADICAL Left 03/09/2020   Procedure: LEFT MODIFIED RADICAL MASTECTOMY;   Surgeon: Stark Klein, MD;  Location: [place];  Service: General;  Laterality: Left;  GEN AND PECTORAL BLOCK  . PORT-A-CATH REMOVAL N/A 08/31/2020   Procedure: REMOVAL PORT-A-CATH;  Surgeon: Stark Klein, MD;  Location: [place];  Service: General;  Laterality: N/A;  . PORTACATH PLACEMENT Left 08/18/2019   Procedure: INSERTION PORT-A-CATH;  Surgeon: Stark Klein, MD;  Location: [place];  Service: General;  Laterality: Left;  . Rt knee arthoscopic    . TEE WITHOUT CARDIOVERSION N/A 02/21/2013   Procedure: TRANSESOPHAGEAL ECHOCARDIOGRAM (TEE);  Surgeon: Lelon Perla, MD;  Location: [facility] ENDOSCOPY;  Service: Cardiovascular;  Laterality: N/A;    There were no vitals filed for this visit.   Subjective Assessment - 12/07/20 1004    Subjective The arm did pretty well.  Still haven't heard from the pump    Pertinent History Patient was diagnosed on 07/24/2019 with left grade III invasive ductal carcinoma breast cancer. ER/PR negative and HER2 positive with a Ki67 of 70%. neoadjuvant chemotherapy completed.  Lt Mastectomy 03/09/20 with 19 axillary nodes removed and 2 inframammary nodes removed.  All negative.  Radiation consultation on 05/04/20.    Patient Stated Goals swelling down, more mobility, get radiation started    Currently in Pain? No/denies  [facility] PT Assessment - 12/07/20 0001      AROM   Left Shoulder Flexion 110 Degrees    Left Shoulder ABduction 94 Degrees             LYMPHEDEMA/ONCOLOGY QUESTIONNAIRE - 12/07/20 0001      Left Upper Extremity Lymphedema   15 cm Proximal to Olecranon Process 42.7 cm    Olecranon Process 33 cm    10 cm Proximal to Ulnar Styloid Process 27.4 cm    Just Proximal to Ulnar Styloid Process 20.4 cm    Across Hand at [employer] 20.2 cm    At [place] of 2nd Digit 6.3 cm              Flowsheet Row [facility]  Lymphedema Life Impact Scale Total Score 38.24 %             [facility] Adult PT Treatment/Exercise - 12/07/20 0001      Manual Therapy   Soft tissue mobilization to the left pectoralis, UT, and lat    Myofascial Release To Lt axilla during P/ROM    Manual Lymphatic Drainage (MLD) in supine: short neck, sternal and clavicular nodes, Rt axillary nodes, and left inguinal nodes, then Lt UE from proximal to distal and then upper arm and posterior shoulder in sidelying    Passive ROM to the left shoulder into flexion and abduction, at various angles to pts tolerance with scapular depression throughout                       PT Long Term Goals - 11/18/20 1104      PT LONG TERM GOAL #1   Title Pt will improve her Lt shoulder active flexion and abduction to at least 140 to improve overhead reaching    Baseline 50 / 60, 104/105 on 09/14/20, 115 on 11/05/20, on 11/18/20: Flex: 105 tight in the chest and abduction: Abd: 90    Status On-going      PT LONG TERM GOAL #2   Title Pt will decrease circumferential measurements by at least 2cm globally proximal to the wrist to demonstrate improved Lt UE edema    Baseline see notes    Status On-going      PT LONG TERM GOAL #3   Title Pt will obtain appropriate compression garments for the Lt UE to manage lymphedema    Status Achieved                 Plan - 12/07/20 1054    Clinical Impression Statement Pt with increases in swelling post air travel this past week and AROM remains unchanged x a few weeks now.  Pt has still not heard back from pump yet so will benefit from continued PT for MLD and PROM.  Some increased pectoralis tenderness today for unknown reason.    PT Frequency 2x / week    PT Duration 4 weeks    PT Treatment/Interventions ADLs/Self Care Home Management;Therapeutic exercise;Patient/family education;Moist Heat;Therapeutic activities;Manual techniques;Joint Manipulations;Passive range of motion;Cryotherapy;Manual lymph drainage;Compression bandaging;Taping    PT Next  Visit Plan any new pump updates? order the night garment?  Lt shoulder PROM, AAROM and Lt UE MLD, scapular strengthening    Consulted and Agree with Plan of Care Patient           Patient will benefit from skilled therapeutic intervention in order to improve the following deficits and impairments:  Visit Diagnosis: Lymphedema  Malignant neoplasm of overlapping sites of left breast in female, estrogen receptor negative ([place])  Abnormal posture  Stiffness of left shoulder, not elsewhere classified     Problem List Patient Active Problem List   Diagnosis Date Noted  . Genetic testing 11/01/2020  . Breast cancer metastasized to axillary lymph node, left ([place]) 03/09/2020  . Coagulopathy ([place]) 12/05/2019  . Epistaxis, recurrent 12/05/2019  . Dehydration   . Non-intractable vomiting   . Intractable nausea and vomiting 09/21/2019  . Generalized weakness 09/21/2019  . AKI (acute kidney injury) ([place]) 09/21/2019  . Leukocytosis 09/21/2019  . Hypernatremia 09/21/2019  . Hyperchloremia 09/21/2019  . Thrombocytopenia ([place]) 09/21/2019  . Port-A-Cath in place 09/08/2019  . Family history of breast cancer   . Family history of throat cancer   . Family history of cervical cancer   . Malignant neoplasm of overlapping sites of left breast in female, estrogen receptor negative ([place]) 08/01/2019  . Family history of colon cancer mom 11/05/2018  . Encounter for therapeutic drug monitoring 11/21/2013  . Asthma with bronchitis 09/01/2013  . Chest pain 01/01/2013  . Aortic insufficiency 10/25/2011  . Chronic anticoagulation 09/29/2011  . Nocturnal dyspnea 09/29/2011  . VITAMIN D DEFICIENCY 08/06/2009  . DVT 01/26/2009  . KNEE PAIN, RIGHT 06/19/2008  . EDEMA 06/19/2008  . ANKLE PAIN, LEFT 02/13/2008  . HYPERKALEMIA 08/20/2007  . HYPOKALEMIA 08/20/2007  . HLD (hyperlipidemia) 07/18/2007  . Cardiomyopathy, hypertensive ([place]) 07/18/2007  . Essential hypertension 06/26/2007  .  Hypertensive heart disease with heart failure ([place]) 06/26/2007  . Congestive heart failure ([place]) 06/26/2007  . Allergic rhinitis, cause unspecified 06/26/2007  . ASTHMA 06/26/2007  . SYMPTOM, SYNCOPE AND COLLAPSE 06/26/2007  . HEADACHE 06/26/2007  . HEART MURMUR, HX OF 06/26/2007  . DVT, HX OF 06/26/2007    Stark Bray 12/07/2020, 10:57 AM  [place], [place], [address] Phone: [phone]   Fax:  [phone]  Name: Early Ord MRN: [id number] Date of Birth: 11-29-1950

## 2020-12-09 ENCOUNTER — Encounter: Payer: Self-pay | Admitting: Rehabilitation

## 2020-12-09 ENCOUNTER — Ambulatory Visit: Payer: Medicare Other | Admitting: Rehabilitation

## 2020-12-09 ENCOUNTER — Other Ambulatory Visit: Payer: Self-pay

## 2020-12-09 DIAGNOSIS — M25612 Stiffness of left shoulder, not elsewhere classified: Secondary | ICD-10-CM

## 2020-12-09 DIAGNOSIS — I890 Lymphedema, not elsewhere classified: Secondary | ICD-10-CM | POA: Diagnosis not present

## 2020-12-09 DIAGNOSIS — C50812 Malignant neoplasm of overlapping sites of left female breast: Secondary | ICD-10-CM | POA: Diagnosis not present

## 2020-12-09 DIAGNOSIS — R293 Abnormal posture: Secondary | ICD-10-CM | POA: Diagnosis not present

## 2020-12-09 DIAGNOSIS — Z171 Estrogen receptor negative status [ER-]: Secondary | ICD-10-CM | POA: Diagnosis not present

## 2020-12-09 NOTE — Therapy (Signed)
[place], [place], [address] Phone: [phone]   Fax:  [phone]  Physical Therapy Treatment  Patient Details  Name: [name] Foster MRN: [id number] Date of Birth: 02-13-1951 Referring Provider (PT): Dr. Lindi Adie   Encounter Date: 12/09/2020   PT End of Session - 12/09/20 0944    Visit Number 33    Number of Visits 37    Date for PT Re-Evaluation 12/16/20    PT Start Time 0857    PT Stop Time 0945    PT Time Calculation (min) 48 min    Activity Tolerance Patient tolerated treatment well    Behavior During Therapy [facility] for tasks assessed/performed           Past Medical History:  Diagnosis Date  . Allergic rhinitis   . Anemia   . Aortic valve disorders   . Arthritis   . Asthma   . Cancer [facility])    Breast Cancer  . CHF (congestive heart failure) ([place])   . Coronary artery disease   . Family history of adverse reaction to anesthesia    difficulty waking mother up after surgery  . Family history of breast cancer   . Family history of cervical cancer   . Family history of colon cancer   . Family history of throat cancer   . Heart murmur   . Hyperlipidemia   . Hypertension   . Long term (current) use of anticoagulants   . Other primary cardiomyopathies   . Peripheral vascular disease (HCC)    right leg  . Personal history of colonic polyps   . Personal history of venous thrombosis and embolism     Past Surgical History:  Procedure Laterality Date  . ABDOMINAL HYSTERECTOMY    . BREAST BIOPSY Left 07/2019  . CARDIAC CATHETERIZATION  2004  . COLON SURGERY     colonoscopy  . DILATION AND CURETTAGE OF UTERUS     several  . MASTECTOMY MODIFIED RADICAL Left 03/09/2020   Left Modified Radical Mastectomy (left mastectomy with axillary lymph node dissection)   . MASTECTOMY MODIFIED RADICAL Left 03/09/2020   Procedure: LEFT MODIFIED RADICAL MASTECTOMY;  Surgeon: Stark Klein, MD;   Location: [place];  Service: General;  Laterality: Left;  GEN AND PECTORAL BLOCK  . PORT-A-CATH REMOVAL N/A 08/31/2020   Procedure: REMOVAL PORT-A-CATH;  Surgeon: Stark Klein, MD;  Location: [place];  Service: General;  Laterality: N/A;  . PORTACATH PLACEMENT Left 08/18/2019   Procedure: INSERTION PORT-A-CATH;  Surgeon: Stark Klein, MD;  Location: [place];  Service: General;  Laterality: Left;  . Rt knee arthoscopic    . TEE WITHOUT CARDIOVERSION N/A 02/21/2013   Procedure: TRANSESOPHAGEAL ECHOCARDIOGRAM (TEE);  Surgeon: Lelon Perla, MD;  Location: [facility] ENDOSCOPY;  Service: Cardiovascular;  Laterality: N/A;    There were no vitals filed for this visit.   Subjective Assessment - 12/09/20 0855    Subjective Nothing new    Pertinent History Patient was diagnosed on 07/24/2019 with left grade III invasive ductal carcinoma breast cancer. ER/PR negative and HER2 positive with a Ki67 of 70%. neoadjuvant chemotherapy completed.  Lt Mastectomy 03/09/20 with 19 axillary nodes removed and 2 inframammary nodes removed.  All negative.  Radiation consultation on 05/04/20.    Patient Stated Goals swelling down, more mobility, get radiation started    Currently in Pain? No/denies                     Flowsheet  [facility]  Lymphedema Life Impact Scale Total Score 38.24 %            [facility] Adult PT Treatment/Exercise - 12/09/20 0001      Shoulder Exercises: Supine   Horizontal ABduction Both;5 reps    Horizontal ABduction Limitations alternating AROM    Flexion AROM;Both;10 reps    Flexion Limitations with dowel holding 5"      Shoulder Exercises: Sidelying   ABduction 5 reps;Left    ABduction Limitations with scapular assist by PT      Manual Therapy   Soft tissue mobilization to the left pectoralis, UT, and lat    Myofascial Release To Lt axilla during P/ROM    Manual Lymphatic Drainage (MLD) in supine: short  neck, sternal and clavicular nodes, Rt axillary nodes, and left inguinal nodes, then Lt UE from proximal to distal and then upper arm and posterior shoulder in sidelying    Passive ROM to the left shoulder into flexion, abduction, ER                       PT Long Term Goals - 11/18/20 1104      PT LONG TERM GOAL #1   Title Pt will improve her Lt shoulder active flexion and abduction to at least 140 to improve overhead reaching    Baseline 50 / 60, 104/105 on 09/14/20, 115 on 11/05/20, on 11/18/20: Flex: 105 tight in the chest and abduction: Abd: 90    Status On-going      PT LONG TERM GOAL #2   Title Pt will decrease circumferential measurements by at least 2cm globally proximal to the wrist to demonstrate improved Lt UE edema    Baseline see notes    Status On-going      PT LONG TERM GOAL #3   Title Pt will obtain appropriate compression garments for the Lt UE to manage lymphedema    Status Achieved                 Plan - 12/09/20 0945    Clinical Impression Statement Although arm did not measure larger after flying last week it did feel softer today which shows some edemamay have been increased but not measureable.  Continued with MLD, PROM, and STM to prepare pt for ind with self care for lymphedema.  Pts sister is ordering night garment.    PT Frequency 2x / week    PT Duration 4 weeks    PT Treatment/Interventions ADLs/Self Care Home Management;Therapeutic exercise;Patient/family education;Moist Heat;Therapeutic activities;Manual techniques;Joint Manipulations;Passive range of motion;Cryotherapy;Manual lymph drainage;Compression bandaging;Taping    PT Next Visit Plan any new pump updates?  Lt shoulder PROM, AAROM and Lt UE MLD, scapular strengthening    PT Home Exercise Plan focusing on HEP given 08/03/20, new HEP 10/12/20    Consulted and Agree with Plan of Care Patient           Patient will benefit from skilled therapeutic intervention in order to improve  the following deficits and impairments:     Visit Diagnosis: Lymphedema  Malignant neoplasm of overlapping sites of left breast in female, estrogen receptor negative (HCC)  Abnormal posture  Stiffness of left shoulder, not elsewhere classified     Problem List Patient Active Problem List   Diagnosis Date Noted  . Genetic testing 11/01/2020  . Breast cancer metastasized to axillary lymph node, left ([place]) 03/09/2020  . Coagulopathy ([place]) 12/05/2019  .  Epistaxis, recurrent 12/05/2019  . Dehydration   . Non-intractable vomiting   . Intractable nausea and vomiting 09/21/2019  . Generalized weakness 09/21/2019  . AKI (acute kidney injury) ([place]) 09/21/2019  . Leukocytosis 09/21/2019  . Hypernatremia 09/21/2019  . Hyperchloremia 09/21/2019  . Thrombocytopenia ([place]) 09/21/2019  . Port-A-Cath in place 09/08/2019  . Family history of breast cancer   . Family history of throat cancer   . Family history of cervical cancer   . Malignant neoplasm of overlapping sites of left breast in female, estrogen receptor negative ([place]) 08/01/2019  . Family history of colon cancer mom 11/05/2018  . Encounter for therapeutic drug monitoring 11/21/2013  . Asthma with bronchitis 09/01/2013  . Chest pain 01/01/2013  . Aortic insufficiency 10/25/2011  . Chronic anticoagulation 09/29/2011  . Nocturnal dyspnea 09/29/2011  . VITAMIN D DEFICIENCY 08/06/2009  . DVT 01/26/2009  . KNEE PAIN, RIGHT 06/19/2008  . EDEMA 06/19/2008  . ANKLE PAIN, LEFT 02/13/2008  . HYPERKALEMIA 08/20/2007  . HYPOKALEMIA 08/20/2007  . HLD (hyperlipidemia) 07/18/2007  . Cardiomyopathy, hypertensive ([place]) 07/18/2007  . Essential hypertension 06/26/2007  . Hypertensive heart disease with heart failure ([place]) 06/26/2007  . Congestive heart failure ([place]) 06/26/2007  . Allergic rhinitis, cause unspecified 06/26/2007  . ASTHMA 06/26/2007  . SYMPTOM, SYNCOPE AND COLLAPSE 06/26/2007  . HEADACHE 06/26/2007  . HEART MURMUR, HX OF  06/26/2007  . DVT, HX OF 06/26/2007    Stark Bray 12/09/2020, 9:47 AM  [place], [place], [address] Phone: [phone]   Fax:  [phone]  Name: [name] Foster MRN: [id number] Date of Birth: [birth year]

## 2020-12-12 NOTE — Progress Notes (Signed)
  Subjective:  Patient ID: [name] Foster, female    DOB: [birth year],  MRN: [id number]  [name] Foster presents to clinic today for at risk foot care with h/o clotting disorder and painful thick toenails that are difficult to trim. Pain interferes with ambulation. Aggravating factors include wearing enclosed shoe gear. Pain is relieved with periodic professional debridement.Marland Kitchen  PCP is Dr. Shanon Ace and last visit was 09/13/2020.  Allergies  Allergen Reactions  . Penicillins Rash    Did it involve swelling of the face/tongue/throat, SOB, or low BP? Unknown Did it involve sudden or severe rash/hives, skin peeling, or any reaction on the inside of your mouth or nose? Yes Did you need to seek medical attention at a hospital or doctor's office? Yes When did it last happen?in her 75s If all above answers are "NO", may proceed with cephalosporin use.   . Tetanus Toxoid Rash    Caused cellulitis   . Aspirin Rash    Review of Systems: Negative except as noted in the HPI. Objective:   Constitutional [name] Foster is a pleasant 70 y.o. African American female, in NAD. AAO x 3.   Vascular Capillary fill time to digits <3 seconds b/l lower extremities. Palpable pedal pulses b/l LE. Pedal hair sparse. Lower extremity skin temperature gradient within normal limits. No pain with calf compression b/l. No cyanosis or clubbing noted.  Neurologic Normal speech. Oriented to person, place, and time. Protective sensation diminished with 10g monofilament b/l.  Dermatologic Pedal skin with normal turgor, texture and tone bilaterally. No open wounds bilaterally. No interdigital macerations bilaterally. Toenails 1-5 b/l elongated, discolored, dystrophic, thickened, crumbly with subungual debris and tenderness to dorsal palpation. She has proximal nailfold paronychias b/l hallux with granulation tissue noted left >right.  Orthopedic: Normal muscle strength 5/5 to all lower extremity muscle  groups bilaterally. No pain crepitus or joint limitation noted with ROM b/l. Pes planus deformity noted b/l.    Radiographs: None Assessment:   1. Pain due to onychomycosis of toenails of both feet   2. Paronychia of great toe   3. Ingrown toenail without infection   4. Blood clotting disorder ([place])   5. Neuropathy    Plan:  Patient was evaluated and treated and all questions answered.  Onychomycosis with pain -Nails palliatively debridement as below -Educated on self-care  Procedure: Nail Debridement Rationale: Pain Type of Debridement: manual, sharp debridement. Instrumentation: Nail nipper, rotary burr. Number of Nails: 10 -Examined patient. -Patient to continue soft, supportive shoe gear daily. -Toenails 1-5 b/l were debrided in length and girth with sterile nail nippers and dremel without iatrogenic bleeding.  -Offending nail border debrided and curretaged L hallux and R hallux utilizing sterile nail nipper and currette. Border(s) cleansed with alcohol and triple antibiotic ointment applied. Dispensed written instructions for once daily epsom salt soaks for 14 days. -Patient to report any pedal injuries to medical professional immediately. -Patient/POA to call should there be question/concern in the interim.  Return in about 2 weeks (around 12/20/2020) with Dr. Sherryle Lis for re-evaluation of paronychia b/l great toes.  Marzetta Board, DPM

## 2020-12-13 NOTE — Progress Notes (Signed)
OK sounds good thanks

## 2020-12-14 ENCOUNTER — Other Ambulatory Visit: Payer: Self-pay

## 2020-12-14 ENCOUNTER — Ambulatory Visit: Payer: Medicare Other

## 2020-12-14 DIAGNOSIS — Z171 Estrogen receptor negative status [ER-]: Secondary | ICD-10-CM | POA: Diagnosis not present

## 2020-12-14 DIAGNOSIS — M25612 Stiffness of left shoulder, not elsewhere classified: Secondary | ICD-10-CM

## 2020-12-14 DIAGNOSIS — I890 Lymphedema, not elsewhere classified: Secondary | ICD-10-CM

## 2020-12-14 DIAGNOSIS — R293 Abnormal posture: Secondary | ICD-10-CM | POA: Diagnosis not present

## 2020-12-14 DIAGNOSIS — C50812 Malignant neoplasm of overlapping sites of left female breast: Secondary | ICD-10-CM | POA: Diagnosis not present

## 2020-12-14 NOTE — Therapy (Signed)
[place], [place], [address] Phone: [phone]   Fax:  [phone]  Physical Therapy Treatment  Patient Details  Name: [name] Foster MRN: [id number] Date of Birth: 08-09-1951 Referring Provider (PT): Dr. Lindi Adie   Encounter Date: 12/14/2020   PT End of Session - 12/14/20 1103    Visit Number 34    Number of Visits 37    Date for PT Re-Evaluation 12/16/20    Authorization Type [employer] medicare    PT Start Time 1004    PT Stop Time 1105    PT Time Calculation (min) 61 min    Activity Tolerance Patient tolerated treatment well    Behavior During Therapy [facility] for tasks assessed/performed           Past Medical History:  Diagnosis Date  . Allergic rhinitis   . Anemia   . Aortic valve disorders   . Arthritis   . Asthma   . Cancer [facility])    Breast Cancer  . CHF (congestive heart failure) ([place])   . Coronary artery disease   . Family history of adverse reaction to anesthesia    difficulty waking mother up after surgery  . Family history of breast cancer   . Family history of cervical cancer   . Family history of colon cancer   . Family history of throat cancer   . Heart murmur   . Hyperlipidemia   . Hypertension   . Long term (current) use of anticoagulants   . Other primary cardiomyopathies   . Peripheral vascular disease (HCC)    right leg  . Personal history of colonic polyps   . Personal history of venous thrombosis and embolism     Past Surgical History:  Procedure Laterality Date  . ABDOMINAL HYSTERECTOMY    . BREAST BIOPSY Left 07/2019  . CARDIAC CATHETERIZATION  2004  . COLON SURGERY     colonoscopy  . DILATION AND CURETTAGE OF UTERUS     several  . MASTECTOMY MODIFIED RADICAL Left 03/09/2020   Left Modified Radical Mastectomy (left mastectomy with axillary lymph node dissection)   . MASTECTOMY MODIFIED RADICAL Left 03/09/2020   Procedure: LEFT MODIFIED RADICAL MASTECTOMY;   Surgeon: Stark Klein, MD;  Location: [place];  Service: General;  Laterality: Left;  GEN AND PECTORAL BLOCK  . PORT-A-CATH REMOVAL N/A 08/31/2020   Procedure: REMOVAL PORT-A-CATH;  Surgeon: Stark Klein, MD;  Location: [place];  Service: General;  Laterality: N/A;  . PORTACATH PLACEMENT Left 08/18/2019   Procedure: INSERTION PORT-A-CATH;  Surgeon: Stark Klein, MD;  Location: [place];  Service: General;  Laterality: Left;  . Rt knee arthoscopic    . TEE WITHOUT CARDIOVERSION N/A 02/21/2013   Procedure: TRANSESOPHAGEAL ECHOCARDIOGRAM (TEE);  Surgeon: Lelon Perla, MD;  Location: [facility] ENDOSCOPY;  Service: Cardiovascular;  Laterality: N/A;    There were no vitals filed for this visit.   Subjective Assessment - 12/14/20 1010    Subjective We ordered the nighttime garment so that should arrive soon. The pump company called me and said insurance denied my pump but they are going to appeal. I do my self MLD but I don't feel like it's beneficial or I'm doing it right.    Pertinent History Patient was diagnosed on 07/24/2019 with left grade III invasive ductal carcinoma breast cancer. ER/PR negative and HER2 positive with a Ki67 of 70%. neoadjuvant chemotherapy completed.  Lt Mastectomy 03/09/20 with 19 axillary nodes (0 positive) removed and 2 inframammary  nodes removed.  All negative.  Radiation consultation on 05/04/20.    Patient Stated Goals swelling down, more mobility, get radiation started    Currently in Pain? No/denies                 LYMPHEDEMA/ONCOLOGY QUESTIONNAIRE - 12/14/20 0001      Left Upper Extremity Lymphedema   15 cm Proximal to Olecranon Process 40.6 cm    10 cm Proximal to Olecranon Process 37.2 cm    Olecranon Process 30.5 cm    15 cm Proximal to Ulnar Styloid Process 30 cm    10 cm Proximal to Ulnar Styloid Process 27.5 cm    Just Proximal to Ulnar Styloid Process 20.9 cm    Across Hand at [employer] 19 cm    At [place] of 2nd Digit 5.8 cm               Flowsheet [facility]  Lymphedema Life Impact Scale Total Score 38.24 %            [facility] Adult PT Treatment/Exercise - 12/14/20 0001      Shoulder Exercises: Pulleys   Flexion 2 minutes    Flexion Limitations Tactile cues to decrease Lt scapular compensations      Shoulder Exercises: Therapy Ball   Flexion Both;5 reps   forward lean into end of stretch     Manual Therapy   Soft tissue mobilization to the left pectoralis, UT, and lat    Myofascial Release To Lt axilla during P/ROM    Manual Lymphatic Drainage (MLD) in supine: short neck, sternal and clavicular nodes, superficial and deep abdominals, Rt axillary nodes, and left inguinal nodes, then Lt UE from proximal to distal and then upper arm and posterior shoulder in sidelying    Passive ROM to the left shoulder into flexion, abduction, ER                       PT Long Term Goals - 11/18/20 1104      PT LONG TERM GOAL #1   Title Pt will improve her Lt shoulder active flexion and abduction to at least 140 to improve overhead reaching    Baseline 50 / 60, 104/105 on 09/14/20, 115 on 11/05/20, on 11/18/20: Flex: 105 tight in the chest and abduction: Abd: 90    Status On-going      PT LONG TERM GOAL #2   Title Pt will decrease circumferential measurements by at least 2cm globally proximal to the wrist to demonstrate improved Lt UE edema    Baseline see notes    Status On-going      PT LONG TERM GOAL #3   Title Pt will obtain appropriate compression garments for the Lt UE to manage lymphedema    Status Achieved                 Plan - 12/14/20 1105    Clinical Impression Statement Continued with MLD to Lt UE with P/ROM to Lt shoulder .Pt struggles with relaxing at end motions due to increased muscle guarding. Ended session with AA/ROM to work on decreasing scapular compensations.    Personal Factors and Comorbidities  Age;Finances;Comorbidity 1    Comorbidities ALND,    Examination-Activity Limitations Dressing;Sleep;Reach Overhead;Carry    Examination-Participation Restrictions Yard Work;Cleaning;Laundry;Community Activity    Stability/Clinical Decision Making Stable/Uncomplicated    Rehab Potential Excellent    PT Frequency 2x /  week    PT Duration 4 weeks    PT Treatment/Interventions ADLs/Self Care Home Management;Therapeutic exercise;Patient/family education;Moist Heat;Therapeutic activities;Manual techniques;Joint Manipulations;Passive range of motion;Cryotherapy;Manual lymph drainage;Compression bandaging;Taping    PT Next Visit Plan any new pump updates since denial for appeal? nighttime garment arrive? Lt shoulder PROM, AAROM and Lt UE MLD, scapular strengthening    PT Home Exercise Plan focusing on HEP given 08/03/20, new HEP 10/12/20    Consulted and Agree with Plan of Care Patient           Patient will benefit from skilled therapeutic intervention in order to improve the following deficits and impairments:  Postural dysfunction,Decreased knowledge of precautions,Impaired UE functional use,Pain,Decreased range of motion,Decreased strength,Increased edema  Visit Diagnosis: Lymphedema  Malignant neoplasm of overlapping sites of left breast in female, estrogen receptor negative (HCC)  Abnormal posture  Stiffness of left shoulder, not elsewhere classified     Problem List Patient Active Problem List   Diagnosis Date Noted  . Genetic testing 11/01/2020  . Breast cancer metastasized to axillary lymph node, left ([place]) 03/09/2020  . Coagulopathy ([place]) 12/05/2019  . Epistaxis, recurrent 12/05/2019  . Dehydration   . Non-intractable vomiting   . Intractable nausea and vomiting 09/21/2019  . Generalized weakness 09/21/2019  . AKI (acute kidney injury) ([place]) 09/21/2019  . Leukocytosis 09/21/2019  . Hypernatremia 09/21/2019  . Hyperchloremia 09/21/2019  . Thrombocytopenia ([place])  09/21/2019  . Port-A-Cath in place 09/08/2019  . Family history of breast cancer   . Family history of throat cancer   . Family history of cervical cancer   . Malignant neoplasm of overlapping sites of left breast in female, estrogen receptor negative ([place]) 08/01/2019  . Family history of colon cancer mom 11/05/2018  . Encounter for therapeutic drug monitoring 11/21/2013  . Asthma with bronchitis 09/01/2013  . Chest pain 01/01/2013  . Aortic insufficiency 10/25/2011  . Chronic anticoagulation 09/29/2011  . Nocturnal dyspnea 09/29/2011  . VITAMIN D DEFICIENCY 08/06/2009  . DVT 01/26/2009  . KNEE PAIN, RIGHT 06/19/2008  . EDEMA 06/19/2008  . ANKLE PAIN, LEFT 02/13/2008  . HYPERKALEMIA 08/20/2007  . HYPOKALEMIA 08/20/2007  . HLD (hyperlipidemia) 07/18/2007  . Cardiomyopathy, hypertensive ([place]) 07/18/2007  . Essential hypertension 06/26/2007  . Hypertensive heart disease with heart failure ([place]) 06/26/2007  . Congestive heart failure ([place]) 06/26/2007  . Allergic rhinitis, cause unspecified 06/26/2007  . ASTHMA 06/26/2007  . SYMPTOM, SYNCOPE AND COLLAPSE 06/26/2007  . HEADACHE 06/26/2007  . HEART MURMUR, HX OF 06/26/2007  . DVT, HX OF 06/26/2007    Otelia Limes, PTA 12/14/2020, 11:09 AM  [place], [place], [address] Phone: [phone]   Fax:  [phone]  Name: [name] Foster MRN: [id number] Date of Birth: Feb 29, 1951

## 2020-12-16 ENCOUNTER — Other Ambulatory Visit: Payer: Self-pay

## 2020-12-16 ENCOUNTER — Ambulatory Visit: Payer: Medicare Other

## 2020-12-16 DIAGNOSIS — Z171 Estrogen receptor negative status [ER-]: Secondary | ICD-10-CM | POA: Diagnosis not present

## 2020-12-16 DIAGNOSIS — C50812 Malignant neoplasm of overlapping sites of left female breast: Secondary | ICD-10-CM

## 2020-12-16 DIAGNOSIS — R293 Abnormal posture: Secondary | ICD-10-CM | POA: Diagnosis not present

## 2020-12-16 DIAGNOSIS — M25612 Stiffness of left shoulder, not elsewhere classified: Secondary | ICD-10-CM

## 2020-12-16 DIAGNOSIS — I890 Lymphedema, not elsewhere classified: Secondary | ICD-10-CM

## 2020-12-16 NOTE — Therapy (Signed)
[place], [place], [address] Phone: [phone]   Fax:  [phone]  Physical Therapy Treatment  Patient Details  Name: [name] Foster MRN: [id number] Date of Birth: 08/02/1951 Referring Provider (PT): Dr. Lindi Adie   Encounter Date: 12/16/2020   PT End of Session - 12/16/20 1106    Visit Number 35    Number of Visits 37    Date for PT Re-Evaluation 12/16/20    Authorization Type [employer] medicare    PT Start Time 1006    PT Stop Time 1105    PT Time Calculation (min) 59 min    Activity Tolerance Patient tolerated treatment well    Behavior During Therapy [facility] for tasks assessed/performed           Past Medical History:  Diagnosis Date  . Allergic rhinitis   . Anemia   . Aortic valve disorders   . Arthritis   . Asthma   . Cancer [facility])    Breast Cancer  . CHF (congestive heart failure) ([place])   . Coronary artery disease   . Family history of adverse reaction to anesthesia    difficulty waking mother up after surgery  . Family history of breast cancer   . Family history of cervical cancer   . Family history of colon cancer   . Family history of throat cancer   . Heart murmur   . Hyperlipidemia   . Hypertension   . Long term (current) use of anticoagulants   . Other primary cardiomyopathies   . Peripheral vascular disease (HCC)    right leg  . Personal history of colonic polyps   . Personal history of venous thrombosis and embolism     Past Surgical History:  Procedure Laterality Date  . ABDOMINAL HYSTERECTOMY    . BREAST BIOPSY Left 07/2019  . CARDIAC CATHETERIZATION  2004  . COLON SURGERY     colonoscopy  . DILATION AND CURETTAGE OF UTERUS     several  . MASTECTOMY MODIFIED RADICAL Left 03/09/2020   Left Modified Radical Mastectomy (left mastectomy with axillary lymph node dissection)   . MASTECTOMY MODIFIED RADICAL Left 03/09/2020   Procedure: LEFT MODIFIED RADICAL MASTECTOMY;   Surgeon: Stark Klein, MD;  Location: [place];  Service: General;  Laterality: Left;  GEN AND PECTORAL BLOCK  . PORT-A-CATH REMOVAL N/A 08/31/2020   Procedure: REMOVAL PORT-A-CATH;  Surgeon: Stark Klein, MD;  Location: [place];  Service: General;  Laterality: N/A;  . PORTACATH PLACEMENT Left 08/18/2019   Procedure: INSERTION PORT-A-CATH;  Surgeon: Stark Klein, MD;  Location: [place];  Service: General;  Laterality: Left;  . Rt knee arthoscopic    . TEE WITHOUT CARDIOVERSION N/A 02/21/2013   Procedure: TRANSESOPHAGEAL ECHOCARDIOGRAM (TEE);  Surgeon: Lelon Perla, MD;  Location: [facility] ENDOSCOPY;  Service: Cardiovascular;  Laterality: N/A;    There were no vitals filed for this visit.   Subjective Assessment - 12/16/20 1010    Subjective Nothing new since I was the other day.    Pertinent History Patient was diagnosed on 07/24/2019 with left grade III invasive ductal carcinoma breast cancer. ER/PR negative and HER2 positive with a Ki67 of 70%. neoadjuvant chemotherapy completed.  Lt Mastectomy 03/09/20 with 19 axillary nodes (0 positive) removed and 2 inframammary nodes removed.  All negative.  Radiation consultation on 05/04/20.    Patient Stated Goals swelling down, more mobility, get radiation started    Currently in Pain? No/denies  Flowsheet Row Outpatient [facility]  Lymphedema Life Impact Scale Total Score 38.24 %            [facility] Adult PT Treatment/Exercise - 12/16/20 0001      Manual Therapy   Soft tissue mobilization to the left pectoralis    Myofascial Release To Lt axilla during P/ROM    Manual Lymphatic Drainage (MLD) in supine: short neck, sternal and clavicular nodes, superficial and deep abdominals, Rt axillary nodes, and left inguinal nodes, anterior inter-axillary and Lt axillo-inguinal anastomosis, then Lt UE from proximal to distal and then upper arm reviewing with pt throughout  and having her return demo usnig hand over hand technique    Passive ROM to the left shoulder into flexion, abduction and scaption; contract/relax for flexion 10 sec on/off and increased motion noted after as well as pt reporting much less tightness across chest.                       PT Long Term Goals - 11/18/20 1104      PT LONG TERM GOAL #1   Title Pt will improve her Lt shoulder active flexion and abduction to at least 140 to improve overhead reaching    Baseline 50 / 60, 104/105 on 09/14/20, 115 on 11/05/20, on 11/18/20: Flex: 105 tight in the chest and abduction: Abd: 90    Status On-going      PT LONG TERM GOAL #2   Title Pt will decrease circumferential measurements by at least 2cm globally proximal to the wrist to demonstrate improved Lt UE edema    Baseline see notes    Status On-going      PT LONG TERM GOAL #3   Title Pt will obtain appropriate compression garments for the Lt UE to manage lymphedema    Status Achieved                 Plan - 12/16/20 1107    Clinical Impression Statement Continued with MLD and reviewed this with pt today as well. She required hand over hand pressure at first for cuing for full skin stretch, then able to return correct demo. Also continued with STM with P/ROM of Lt shoulder and included contract/relax which did help improve end P/ROM and pt reported her chest not feeling as tight after.    Personal Factors and Comorbidities Age;Finances;Comorbidity 1    Comorbidities ALND,    Examination-Activity Limitations Dressing;Sleep;Reach Overhead;Carry    Examination-Participation Restrictions Yard Work;Cleaning;Laundry;Community Activity    Stability/Clinical Decision Making Stable/Uncomplicated    Rehab Potential Excellent    PT Frequency 2x / week    PT Duration 4 weeks    PT Treatment/Interventions ADLs/Self Care Home Management;Therapeutic exercise;Patient/family education;Moist Heat;Therapeutic activities;Manual  techniques;Joint Manipulations;Passive range of motion;Cryotherapy;Manual lymph drainage;Compression bandaging;Taping    PT Next Visit Plan Renewal neede next if pt to cont. May switch to focus on Lt shoulder ROM? any new pump updates since denial for appeal? nighttime garment arrive? Lt shoulder PROM, AAROM and Lt UE MLD, scapular strengthening    PT Home Exercise Plan focusing on HEP given 08/03/20, new HEP 10/12/20    Consulted and Agree with Plan of Care Patient           Patient will benefit from skilled therapeutic intervention in order to improve the following deficits and impairments:  Postural dysfunction,Decreased knowledge of precautions,Impaired UE functional use,Pain,Decreased range of motion,Decreased strength,Increased edema  Visit Diagnosis: Lymphedema    Malignant neoplasm of overlapping sites of left breast in female, estrogen receptor negative (HCC)  Abnormal posture  Stiffness of left shoulder, not elsewhere classified     Problem List Patient Active Problem List   Diagnosis Date Noted  . Genetic testing 11/01/2020  . Breast cancer metastasized to axillary lymph node, left (HCC) 03/09/2020  . Coagulopathy (HCC) 12/05/2019  . Epistaxis, recurrent 12/05/2019  . Dehydration   . Non-intractable vomiting   . Intractable nausea and vomiting 09/21/2019  . Generalized weakness 09/21/2019  . AKI (acute kidney injury) (HCC) 09/21/2019  . Leukocytosis 09/21/2019  . Hypernatremia 09/21/2019  . Hyperchloremia 09/21/2019  . Thrombocytopenia (HCC) 09/21/2019  . Port-A-Cath in place 09/08/2019  . Family history of breast cancer   . Family history of throat cancer   . Family history of cervical cancer   . Malignant neoplasm of overlapping sites of left breast in female, estrogen receptor negative (HCC) 08/01/2019  . Family history of colon cancer mom 11/05/2018  . Encounter for therapeutic drug monitoring 11/21/2013  . Asthma with bronchitis 09/01/2013  . Chest pain  01/01/2013  . Aortic insufficiency 10/25/2011  . Chronic anticoagulation 09/29/2011  . Nocturnal dyspnea 09/29/2011  . VITAMIN D DEFICIENCY 08/06/2009  . DVT 01/26/2009  . KNEE PAIN, RIGHT 06/19/2008  . EDEMA 06/19/2008  . ANKLE PAIN, LEFT 02/13/2008  . HYPERKALEMIA 08/20/2007  . HYPOKALEMIA 08/20/2007  . HLD (hyperlipidemia) 07/18/2007  . Cardiomyopathy, hypertensive (HCC) 07/18/2007  . Essential hypertension 06/26/2007  . Hypertensive heart disease with heart failure (HCC) 06/26/2007  . Congestive heart failure (HCC) 06/26/2007  . Allergic rhinitis, cause unspecified 06/26/2007  . ASTHMA 06/26/2007  . SYMPTOM, SYNCOPE AND COLLAPSE 06/26/2007  . HEADACHE 06/26/2007  . HEART MURMUR, HX OF 06/26/2007  . DVT, HX OF 06/26/2007    ,  Ann, PTA 12/16/2020, 11:35 AM  [facility] [address] [place], [place], [address] Phone: [phone]   Fax:  [phone]  Name: [name] Foster MRN: [id number] Date of Birth: 07/16/1951   

## 2020-12-19 NOTE — Progress Notes (Signed)
Chief Complaint  Patient presents with  . Annual Exam    HPI: Patient  [name] Foster  70 y.o. comes in today for [facility] visit   Last seen by me  2 2020  For pv And.mmi  Has specialty care  For breast cancer and residual lymphedema and feet neuropathy. Followed by Dr. Stanford Breed for hypertensive cardiomyopathy and follow-up for her chemo history of DVT on chronic anticoagulation followed by PCP team.  Today is doing okay will be needing an upgraded help for her lymphedema left arm.  Fortunately she is right-handed.  Question about back pain that she had before the cancer diagnosis and got worse after the chemo worse after she standing a long time without radiation rest is helpful no fever or other alarm symptoms.  Asked about the imaging studies noted in the past.  She has had an MRI of her abdomen bone scan no evidence of mets but some question of facet arthropathy at L4. Health Maintenance  Topic Date Due  . DEXA SCAN  Never done  . COLONOSCOPY (Pts 45-53yrs Insurance coverage will need to be confirmed)  06/25/2017  . TETANUS/TDAP  07/04/2027 (Originally 05/29/1970)  . COVID-19 Vaccine (4 - Booster for Pfizer series) 01/15/2021  . MAMMOGRAM  09/23/2022  . INFLUENZA VACCINE  Completed  . Hepatitis C Screening  Completed  . PNA vac Low Risk Adult  Completed  . HPV VACCINES  Aged Out   Health Maintenance Review LIFESTYLE:  Exercise: Somewhat limited cardiovascular asked to fill out handicap renewal to be done today. Tobacco/ETS: no Alcohol:  ocass wine   Sugar beverages: pepsi   Sleep: 4- 5  Bathroom   Then some .  Drug use: no HH of   2 no pets  Work:NA has retired.    ROS:  GEN/ HEENT: No fever, significant weight changes sweats headaches vision problems hearing changes, CV/ PULM; No chest pain shortness of breath cough, syncope,edema  change in exercise tolerance. GI /GU: No adominal pain, vomiting, change in bowel habits. No blood in the stool.   Nocturia every few hours no change not in day does not seem to be related to medications and no history of sleep apnea. SKIN/HEME: ,no acute skin rashes suspicious lesions or bleeding. No lymphadenopathy, nodules, masses.  NEURO/ PSYCH: Residual distal numbness feet.  No depression anxiety.  Changes IMM/ Allergy: No unusual infections.  Allergy .   REST of 12 system review negative except as per HPI   Past Medical History:  Diagnosis Date  . Allergic rhinitis   . Anemia   . Aortic valve disorders   . Arthritis   . Asthma   . Cancer [facility])    Breast Cancer  . CHF (congestive heart failure) ([place])   . Coronary artery disease   . Family history of adverse reaction to anesthesia    difficulty waking mother up after surgery  . Family history of breast cancer   . Family history of cervical cancer   . Family history of colon cancer   . Family history of throat cancer   . Heart murmur   . Hyperlipidemia   . Hypertension   . Long term (current) use of anticoagulants   . Other primary cardiomyopathies   . Peripheral vascular disease (HCC)    right leg  . Personal history of colonic polyps   . Personal history of venous thrombosis and embolism     Past Surgical History:  Procedure Laterality Date  . ABDOMINAL  HYSTERECTOMY    . BREAST BIOPSY Left 07/2019  . CARDIAC CATHETERIZATION  2004  . COLON SURGERY     colonoscopy  . DILATION AND CURETTAGE OF UTERUS     several  . MASTECTOMY MODIFIED RADICAL Left 03/09/2020   Left Modified Radical Mastectomy (left mastectomy with axillary lymph node dissection)   . MASTECTOMY MODIFIED RADICAL Left 03/09/2020   Procedure: LEFT MODIFIED RADICAL MASTECTOMY;  Surgeon: Stark Klein, MD;  Location: [facility];  Service: General;  Laterality: Left;  GEN AND PECTORAL BLOCK  . PORT-A-CATH REMOVAL N/A 08/31/2020   Procedure: REMOVAL PORT-A-CATH;  Surgeon: Stark Klein, MD;  Location: [place];  Service: General;  Laterality: N/A;  . PORTACATH PLACEMENT Left  08/18/2019   Procedure: INSERTION PORT-A-CATH;  Surgeon: Stark Klein, MD;  Location: [facility];  Service: General;  Laterality: Left;  . Rt knee arthoscopic    . TEE WITHOUT CARDIOVERSION N/A 02/21/2013   Procedure: TRANSESOPHAGEAL ECHOCARDIOGRAM (TEE);  Surgeon: Lelon Perla, MD;  Location: [facility] ENDOSCOPY;  Service: Cardiovascular;  Laterality: N/A;    Family History  Problem Relation Age of Onset  . Alcohol abuse Other   . Depression Other   . Hyperlipidemia Other   . Hypertension Other   . Kidney disease Other   . Colon cancer Mother        dx late 61s  . Cervical cancer Maternal Grandmother   . Stroke Paternal Grandmother   . Breast cancer Maternal Aunt        dx 51s  . Breast cancer Cousin        dx 46s    Social History   Socioeconomic History  . Marital status: Legally Separated    Spouse name: Not on file  . Number of children: Not on file  . Years of education: Not on file  . Highest education level: Not on file  Occupational History  . Not on file  Tobacco Use  . Smoking status: Former Smoker    Packs/day: 0.25    Types: Cigarettes    Quit date: 07/02/2019    Years since quitting: 1.4  . Smokeless tobacco: Never Used  Vaping Use  . Vaping Use: Never used  Substance and Sexual Activity  . Alcohol use: Yes    Comment: rare occasion  . Drug use: No  . Sexual activity: Not on file    Comment: Hysterectomy  Other Topics Concern  . 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    Divorced   Regular exercise- no   5 hours sleep    Lives with a son age 58    No pets         Social Determinants of Health   Financial Resource Strain: Not on file  Food Insecurity: Not on file  Transportation Needs: Not on file  Physical Activity: Not on file  Stress: Not on file  Social Connections: Not on file    Outpatient Medications Prior to Visit  Medication Sig Dispense Refill  . Albuterol Sulfate (PROAIR RESPICLICK)  620 (90 Base) MCG/ACT AEPB Inhale 2 puffs into the lungs every 6 (six) hours as needed. (Patient taking differently: Inhale 2 puffs into the lungs every 6 (six) hours as needed (SOB / wheezing).) 2 each 1  . amLODipine (NORVASC) 10 MG tablet Take 1 tablet (10 mg total) by mouth daily. 90 tablet 3  . atorvastatin (LIPITOR) 20 MG tablet TAKE 1 TABLET BY MOUTH  DAILY (  Patient taking differently: Take 20 mg by mouth at bedtime.) 90 tablet 3  . Carboxymethylcellul-Glycerin (CLEAR EYES FOR DRY EYES) 1-0.25 % SOLN Place 1 drop into both eyes 3 (three) times daily as needed (dry eyes).     . Cholecalciferol (VITAMIN D) 50 MCG (2000 UT) tablet Take 2,000 Units by mouth daily.    . fexofenadine (ALLEGRA) 180 MG tablet Take 180 mg by mouth daily as needed for allergies.     . fluticasone (FLONASE) 50 MCG/ACT nasal spray USE 2 SPRAYS IN EACH NOSTRIL DAILY. PT NEEDS TO SCHEDULE A FOLLOW UP APPT BEFORE NEXT REFILL. (Patient taking differently: Place 2 sprays into both nostrils daily.) 16 g 0  . furosemide (LASIX) 40 MG tablet TAKE 1 TABLET BY MOUTH  DAILY 90 tablet 3  . hydrALAZINE (APRESOLINE) 50 MG tablet Take 0.5 tablets (25 mg total) by mouth 3 (three) times daily. 135 tablet 3  . methocarbamol (ROBAXIN) 500 MG tablet Take 1 tablet (500 mg total) by mouth every 6 (six) hours as needed for muscle spasms. 20 tablet 1  . metoprolol tartrate (LOPRESSOR) 50 MG tablet TAKE 1 TABLET BY MOUTH  TWICE DAILY (Patient taking differently: Take 50 mg by mouth in the morning and at bedtime.) 180 tablet 3  . oxyCODONE (OXY IR/ROXICODONE) 5 MG immediate release tablet Take 0.5-1 tablets (2.5-5 mg total) by mouth every 6 (six) hours as needed for severe pain. 8 tablet 0  . potassium chloride SA (KLOR-CON) 20 MEQ tablet Take 20 mEq by mouth daily.    . sodium chloride (OCEAN) 0.65 % SOLN nasal spray Place 1 spray into both nostrils as needed for congestion.    . traMADol (ULTRAM) 50 MG tablet Take 1 tablet (50 mg total) by mouth  every 6 (six) hours as needed (mild pain). 20 tablet 0  . warfarin (COUMADIN) 5 MG tablet TAKE 1 TABLET BY MOUTH  DAILY OR AS DIRECTED BY  ANTICOAGULATION CLINIC (Patient taking differently: Take 5 mg by mouth at bedtime. TAKE 1 TABLET BY MOUTH  DAILY OR AS DIRECTED BY  ANTICOAGULATION CLINIC) 100 tablet 3  . Wound Dressings (SONAFINE EX) Apply 1 application topically in the morning and at bedtime.     No facility-administered medications prior to visit.     EXAM:  BP 136/68 (BP Location: Right Arm, Patient Position: Sitting, Cuff Size: Large)   Pulse 76   Temp 98.2 F (36.8 C) (Oral)   Ht 5' 6.5" (1.689 m)   Wt 215 lb 6.4 oz (97.7 kg)   LMP  (LMP Unknown)   SpO2 96%   BMI 34.25 kg/m   Body mass index is 34.25 kg/m. Wt Readings from Last 3 Encounters:  12/20/20 215 lb 6.4 oz (97.7 kg)  09/13/20 204 lb (92.5 kg)  08/31/20 197 lb 1.5 oz (89.4 kg)    Physical Exam: Vital signs reviewed ONG:EXBM is a well-developed well-nourished alert cooperative    who appearsr stated age in no acute distress.  HEENT: normocephalic atraumatic , Eyes: PERRL EOM's full, conjunctiva clear, Nares: paten,t no deformity discharge or tenderness., Ears: no deformity EAC's clear TMs with normal landmarks. Mouth: Masked NECK: supple without masses, thyromegaly or bruits. CHEST/PULM:  Clear to auscultation and percussion breath sounds equal no wheeze , rales or rhonchi. No chest wall deformities or tenderness. Breast: Right breast normal without masses.  Left arm in wrap splint for lymphedema. CV: PMI is nondisplaced, S1 S2 no gallops, murmurs, rubs. Peripheral pulses are present.No JVD .  ABDOMEN:  Bowel sounds normal nontender  No guard or rebound, no hepato splenomegal no CVA tenderness.  No hernia. Extremtities:  No clubbing cyanosis or edema,( left arm excluded) no acute joint swelling or redness no focal atrophy NEURO:  Oriented x3, cranial nerves 3-12 appear to be intact, no obvious focal weakness,gait  within normal limits no abnormal reflexes or asymmetrical SKIN: No acute rashes normal turgor, color, no bruising or petechiae. PSYCH: Oriented, good eye contact, no obvious depression anxiety, cognition and judgment appear normal. LN: no cervical axillary right inguinal adenopathy  Lab Results  Component Value Date   WBC 5.9 08/31/2020   HGB 11.4 (L) 08/31/2020   HCT 37.5 08/31/2020   PLT 208 08/31/2020   GLUCOSE 102 (H) 08/31/2020   CHOL 157 11/05/2018   TRIG 144.0 11/05/2018   HDL 40.00 11/05/2018   LDLDIRECT 175.9 07/01/2007   LDLCALC 88 11/05/2018   ALT 17 08/19/2020   AST 21 08/19/2020   NA 139 08/31/2020   K 3.7 08/31/2020   CL 106 08/31/2020   CREATININE 1.87 (H) 08/31/2020   BUN 14 08/31/2020   CO2 24 08/31/2020   TSH 3.058 09/22/2019   INR 1.7 (H) 08/19/2020   HGBA1C 6.5 (H) 09/22/2019    BP Readings from Last 3 Encounters:  12/20/20 136/68  09/13/20 136/78  08/31/20 121/73    Lab plan reviewed with patient   ASSESSMENT AND PLAN:  Discussed the following assessment and plan:    ICD-10-CM   1. Encounter for preventative adult health care exam with abnormal findings  Z00.01   2. Hypertensive heart disease with heart failure (HCC)  I11.0 Comprehensive metabolic panel    CBC with Differential/Platelet    Hemoglobin A1c    Lipid panel    TSH    Comprehensive metabolic panel    CBC with Differential/Platelet    Hemoglobin A1c    Lipid panel    TSH  3. Hyperlipidemia, unspecified hyperlipidemia type  E78.5 Comprehensive metabolic panel    CBC with Differential/Platelet    Hemoglobin A1c    Lipid panel    TSH    Comprehensive metabolic panel    CBC with Differential/Platelet    Hemoglobin A1c    Lipid panel    TSH  4. Breast cancer metastasized to axillary lymph node, left (HCC)  C50.912 Comprehensive metabolic panel   F35.4 CBC with Differential/Platelet    Hemoglobin A1c    Lipid panel    TSH    Comprehensive metabolic panel    CBC with  Differential/Platelet    Hemoglobin A1c    Lipid panel    TSH  5. Cardiomyopathy due to hypertension, without heart failure (HCC)  I11.9 Comprehensive metabolic panel   T62 CBC with Differential/Platelet    Hemoglobin A1c    Lipid panel    TSH    Comprehensive metabolic panel    CBC with Differential/Platelet    Hemoglobin A1c    Lipid panel    TSH  6. Lymphedema  I89.0 Comprehensive metabolic panel    CBC with Differential/Platelet    Hemoglobin A1c    Lipid panel    TSH    Comprehensive metabolic panel    CBC with Differential/Platelet    Hemoglobin A1c    Lipid panel    TSH  7. Long term (current) use of anticoagulants  Z79.01 Comprehensive metabolic panel    CBC with Differential/Platelet    Hemoglobin A1c    Lipid panel    TSH    Comprehensive  metabolic panel    CBC with Differential/Platelet    Hemoglobin A1c    Lipid panel    TSH    Protime-INR  8. Need for prophylactic vaccination against Streptococcus pneumoniae (pneumococcus)  Z23 Pneumococcal polysaccharide vaccine 23-valent greater than or equal to 2yo subcutaneous/IM  9. Chronic midline low back pain without sciatica  M54.50    G89.29   10. Stage 3 chronic kidney disease, unspecified whether stage 3a or 3b CKD (HCC)  N18.30   Discussion Prevnar 23 booster today Shingrix at some point Discussed low back pain which sounds mechanical and not alarming however would have oncology address this specifically as no reason for reimaging. Depending on lab we will plan follow-up in any other factors that come up in the interim To continue lifelong anticoagulation.  Consider physical therapy and/or sports medicine referral about her back she will contact us if it is getting worse and limiting.  Return for depending on results or yearly .  Patient Care Team: Yuval Nolet, Standley Brooking, MD as PCP - General Stanford Breed Denice Bors, MD as PCP - Cardiology (Cardiology) Stanford Breed Denice Bors, MD (Cardiology) Susa Day, MD (Orthopedic  Surgery) Mauro Kaufmann, RN as Oncology Nurse Navigator Rockwell Germany, RN as Oncology Nurse Navigator Nicholas Lose, MD as Consulting Physician (Hematology and Oncology) Patient Instructions   Will notify you  of labs when available.  Will share results with team .  shingrix vaccine in future     Health Maintenance, Female Adopting a healthy lifestyle and getting preventive care are important in promoting health and wellness. Ask your health care provider about:  The right schedule for you to have regular tests and exams.  Things you can do on your own to prevent diseases and keep yourself healthy. What should I know about diet, weight, and exercise? Eat a healthy diet  Eat a diet that includes plenty of vegetables, fruits, low-fat dairy products, and lean protein.  Do not eat a lot of foods that are high in solid fats, added sugars, or sodium.   Maintain a healthy weight Body mass index (BMI) is used to identify weight problems. It estimates body fat based on height and weight. Your health care provider can help determine your BMI and help you achieve or maintain a healthy weight. Get regular exercise Get regular exercise. This is one of the most important things you can do for your health. Most adults should:  Exercise for at least 150 minutes each week. The exercise should increase your heart rate and make you sweat (moderate-intensity exercise).  Do strengthening exercises at least twice a week. This is in addition to the moderate-intensity exercise.  Spend less time sitting. Even light physical activity can be beneficial. Watch cholesterol and blood lipids Have your blood tested for lipids and cholesterol at 69 years of age, then have this test every 5 years. Have your cholesterol levels checked more often if:  Your lipid or cholesterol levels are high.  You are older than 69 years of age.  You are at high risk for heart disease. What should I know about cancer  screening? Depending on your health history and family history, you may need to have cancer screening at various ages. This may include screening for:  Breast cancer.  Cervical cancer.  Colorectal cancer.  Skin cancer.  Lung cancer. What should I know about heart disease, diabetes, and high blood pressure? Blood pressure and heart disease  High blood pressure causes heart disease and increases the risk  of stroke. This is more likely to develop in people who have high blood pressure readings, are of African descent, or are overweight.  Have your blood pressure checked: ? Every 3-5 years if you are 82-60 years of age. ? Every year if you are 43 years old or older. Diabetes Have regular diabetes screenings. This checks your fasting blood sugar level. Have the screening done:  Once every three years after age 83 if you are at a normal weight and have a low risk for diabetes.  More often and at a younger age if you are overweight or have a high risk for diabetes. What should I know about preventing infection? Hepatitis B If you have a higher risk for hepatitis B, you should be screened for this virus. Talk with your health care provider to find out if you are at risk for hepatitis B infection. Hepatitis C Testing is recommended for:  Everyone born from [birth year] through 1964.  Anyone with known risk factors for hepatitis C. Sexually transmitted infections (STIs)  Get screened for STIs, including gonorrhea and chlamydia, if: ? You are sexually active and are younger than 69 years of age. ? You are older than 69 years of age and your health care provider tells you that you are at risk for this type of infection. ? Your sexual activity has changed since you were last screened, and you are at increased risk for chlamydia or gonorrhea. Ask your health care provider if you are at risk.  Ask your health care provider about whether you are at high risk for HIV. Your health care provider may  recommend a prescription medicine to help prevent HIV infection. If you choose to take medicine to prevent HIV, you should first get tested for HIV. You should then be tested every 3 months for as long as you are taking the medicine. Pregnancy  If you are about to stop having your period (premenopausal) and you may become pregnant, seek counseling before you get pregnant.  Take 400 to 800 micrograms (mcg) of folic acid every day if you become pregnant.  Ask for birth control (contraception) if you want to prevent pregnancy. Osteoporosis and menopause Osteoporosis is a disease in which the bones lose minerals and strength with aging. This can result in bone fractures. If you are 52 years old or older, or if you are at risk for osteoporosis and fractures, ask your health care provider if you should:  Be screened for bone loss.  Take a calcium or vitamin D supplement to lower your risk of fractures.  Be given hormone replacement therapy (HRT) to treat symptoms of menopause. Follow these instructions at home: Lifestyle  Do not use any products that contain nicotine or tobacco, such as cigarettes, e-cigarettes, and chewing tobacco. If you need help quitting, ask your health care provider.  Do not use street drugs.  Do not share needles.  Ask your health care provider for help if you need support or information about quitting drugs. Alcohol use  Do not drink alcohol if: ? Your health care provider tells you not to drink. ? You are pregnant, may be pregnant, or are planning to become pregnant.  If you drink alcohol: ? Limit how much you use to 0-1 drink a day. ? Limit intake if you are breastfeeding.  Be aware of how much alcohol is in your drink. In the [place] one drink equals one 12 oz bottle of beer (355 mL), one 5 oz glass of wine (148  mL), or one 1 oz glass of hard liquor (44 mL). General instructions  Schedule regular health, dental, and eye exams.  Stay current with your  vaccines.  Tell your health care provider if: ? You often feel depressed. ? You have ever been abused or do not feel safe at home. Summary  Adopting a healthy lifestyle and getting preventive care are important in promoting health and wellness.  Follow your health care provider's instructions about healthy diet, exercising, and getting tested or screened for diseases.  Follow your health care provider's instructions on monitoring your cholesterol and blood pressure. This information is not intended to replace advice given to you by your health care provider. Make sure you discuss any questions you have with your health care provider. Document Revised: 09/11/2018 Document Reviewed: 09/11/2018 Elsevier Patient Education  2021 [place] K. Eder Macek M.D.

## 2020-12-20 ENCOUNTER — Other Ambulatory Visit: Payer: Self-pay

## 2020-12-20 ENCOUNTER — Encounter: Payer: Self-pay | Admitting: Internal Medicine

## 2020-12-20 ENCOUNTER — Other Ambulatory Visit (INDEPENDENT_AMBULATORY_CARE_PROVIDER_SITE_OTHER): Payer: Medicare Other

## 2020-12-20 ENCOUNTER — Ambulatory Visit (INDEPENDENT_AMBULATORY_CARE_PROVIDER_SITE_OTHER): Payer: Medicare Other | Admitting: Internal Medicine

## 2020-12-20 VITALS — BP 136/68 | HR 76 | Temp 98.2°F | Ht 66.5 in | Wt 215.4 lb

## 2020-12-20 DIAGNOSIS — M545 Low back pain, unspecified: Secondary | ICD-10-CM | POA: Diagnosis not present

## 2020-12-20 DIAGNOSIS — I110 Hypertensive heart disease with heart failure: Secondary | ICD-10-CM | POA: Diagnosis not present

## 2020-12-20 DIAGNOSIS — Z0000 Encounter for general adult medical examination without abnormal findings: Secondary | ICD-10-CM | POA: Diagnosis not present

## 2020-12-20 DIAGNOSIS — Z7901 Long term (current) use of anticoagulants: Secondary | ICD-10-CM

## 2020-12-20 DIAGNOSIS — Z23 Encounter for immunization: Secondary | ICD-10-CM | POA: Diagnosis not present

## 2020-12-20 DIAGNOSIS — I890 Lymphedema, not elsewhere classified: Secondary | ICD-10-CM | POA: Diagnosis not present

## 2020-12-20 DIAGNOSIS — I43 Cardiomyopathy in diseases classified elsewhere: Secondary | ICD-10-CM

## 2020-12-20 DIAGNOSIS — C50912 Malignant neoplasm of unspecified site of left female breast: Secondary | ICD-10-CM | POA: Diagnosis not present

## 2020-12-20 DIAGNOSIS — N183 Chronic kidney disease, stage 3 unspecified: Secondary | ICD-10-CM | POA: Diagnosis not present

## 2020-12-20 DIAGNOSIS — I119 Hypertensive heart disease without heart failure: Secondary | ICD-10-CM | POA: Diagnosis not present

## 2020-12-20 DIAGNOSIS — C773 Secondary and unspecified malignant neoplasm of axilla and upper limb lymph nodes: Secondary | ICD-10-CM | POA: Diagnosis not present

## 2020-12-20 DIAGNOSIS — E785 Hyperlipidemia, unspecified: Secondary | ICD-10-CM

## 2020-12-20 DIAGNOSIS — Z0001 Encounter for general adult medical examination with abnormal findings: Secondary | ICD-10-CM

## 2020-12-20 DIAGNOSIS — G8929 Other chronic pain: Secondary | ICD-10-CM

## 2020-12-20 DIAGNOSIS — R6889 Other general symptoms and signs: Secondary | ICD-10-CM | POA: Diagnosis not present

## 2020-12-20 LAB — CBC WITH DIFFERENTIAL/PLATELET
Basophils Absolute: 0 10*3/uL (ref 0.0–0.1)
Basophils Relative: 0.5 % (ref 0.0–3.0)
Eosinophils Absolute: 0.1 10*3/uL (ref 0.0–0.7)
Eosinophils Relative: 1 % (ref 0.0–5.0)
HCT: 38 % (ref 36.0–46.0)
Hemoglobin: 12.7 g/dL (ref 12.0–15.0)
Lymphocytes Relative: 34.7 % (ref 12.0–46.0)
Lymphs Abs: 2.1 10*3/uL (ref 0.7–4.0)
MCHC: 33.4 g/dL (ref 30.0–36.0)
MCV: 87.1 fl (ref 78.0–100.0)
Monocytes Absolute: 0.4 10*3/uL (ref 0.1–1.0)
Monocytes Relative: 6.6 % (ref 3.0–12.0)
Neutro Abs: 3.4 10*3/uL (ref 1.4–7.7)
Neutrophils Relative %: 57.2 % (ref 43.0–77.0)
Platelets: 230 10*3/uL (ref 150.0–400.0)
RBC: 4.36 Mil/uL (ref 3.87–5.11)
RDW: 14.7 % (ref 11.5–15.5)
WBC: 5.9 10*3/uL (ref 4.0–10.5)

## 2020-12-20 LAB — COMPREHENSIVE METABOLIC PANEL
ALT: 16 U/L (ref 0–35)
AST: 16 U/L (ref 0–37)
Albumin: 4.4 g/dL (ref 3.5–5.2)
Alkaline Phosphatase: 128 U/L — ABNORMAL HIGH (ref 39–117)
BUN: 21 mg/dL (ref 6–23)
CO2: 29 mEq/L (ref 19–32)
Calcium: 9.5 mg/dL (ref 8.4–10.5)
Chloride: 103 mEq/L (ref 96–112)
Creatinine, Ser: 1.72 mg/dL — ABNORMAL HIGH (ref 0.40–1.20)
GFR: 30.18 mL/min — ABNORMAL LOW (ref >60.00)
Glucose, Bld: 90 mg/dL (ref 70–99)
Potassium: 3.8 mEq/L (ref 3.5–5.1)
Sodium: 142 mEq/L (ref 135–145)
Total Bilirubin: 0.4 mg/dL (ref 0.2–1.2)
Total Protein: 7.2 g/dL (ref 6.0–8.3)

## 2020-12-20 LAB — LIPID PANEL
Cholesterol: 171 mg/dL (ref 0–200)
HDL: 54.6 mg/dL (ref >39.00)
LDL Cholesterol: 94 mg/dL (ref 0–99)
NonHDL: 116.87
Total CHOL/HDL Ratio: 3
Triglycerides: 116 mg/dL (ref 0.0–149.0)
VLDL: 23.2 mg/dL (ref 0.0–40.0)

## 2020-12-20 LAB — PROTIME-INR
INR: 2 ratio — ABNORMAL HIGH (ref 0.8–1.0)
Prothrombin Time: 22.3 s — ABNORMAL HIGH (ref 9.6–13.1)

## 2020-12-20 LAB — TSH: TSH: 2.52 u[IU]/mL (ref 0.35–4.50)

## 2020-12-20 LAB — HEMOGLOBIN A1C: Hgb A1c MFr Bld: 5.8 % (ref 4.6–6.5)

## 2020-12-20 NOTE — Progress Notes (Signed)
Inr 2    in range  forwarding to [place]

## 2020-12-20 NOTE — Addendum Note (Signed)
Addended by: Tessie Fass D on: 12/20/2020 01:39 PM   Modules accepted: Orders

## 2020-12-20 NOTE — Patient Instructions (Addendum)
Will notify you  of labs when available.  Will share results with team .  shingrix vaccine in future     Health Maintenance, Female Adopting a healthy lifestyle and getting preventive care are important in promoting health and wellness. Ask your health care provider about:  The right schedule for you to have regular tests and exams.  Things you can do on your own to prevent diseases and keep yourself healthy. What should I know about diet, weight, and exercise? Eat a healthy diet  Eat a diet that includes plenty of vegetables, fruits, low-fat dairy products, and lean protein.  Do not eat a lot of foods that are high in solid fats, added sugars, or sodium.   Maintain a healthy weight Body mass index (BMI) is used to identify weight problems. It estimates body fat based on height and weight. Your health care provider can help determine your BMI and help you achieve or maintain a healthy weight. Get regular exercise Get regular exercise. This is one of the most important things you can do for your health. Most adults should:  Exercise for at least 150 minutes each week. The exercise should increase your heart rate and make you sweat (moderate-intensity exercise).  Do strengthening exercises at least twice a week. This is in addition to the moderate-intensity exercise.  Spend less time sitting. Even light physical activity can be beneficial. Watch cholesterol and blood lipids Have your blood tested for lipids and cholesterol at 69 years of age, then have this test every 5 years. Have your cholesterol levels checked more often if:  Your lipid or cholesterol levels are high.  You are older than 69 years of age.  You are at high risk for heart disease. What should I know about cancer screening? Depending on your health history and family history, you may need to have cancer screening at various ages. This may include screening for:  Breast cancer.  Cervical cancer.  Colorectal  cancer.  Skin cancer.  Lung cancer. What should I know about heart disease, diabetes, and high blood pressure? Blood pressure and heart disease  High blood pressure causes heart disease and increases the risk of stroke. This is more likely to develop in people who have high blood pressure readings, are of African descent, or are overweight.  Have your blood pressure checked: ? Every 3-5 years if you are 55-75 years of age. ? Every year if you are 61 years old or older. Diabetes Have regular diabetes screenings. This checks your fasting blood sugar level. Have the screening done:  Once every three years after age 46 if you are at a normal weight and have a low risk for diabetes.  More often and at a younger age if you are overweight or have a high risk for diabetes. What should I know about preventing infection? Hepatitis B If you have a higher risk for hepatitis B, you should be screened for this virus. Talk with your health care provider to find out if you are at risk for hepatitis B infection. Hepatitis C Testing is recommended for:  Everyone born from [birth year] through 1964.  Anyone with known risk factors for hepatitis C. Sexually transmitted infections (STIs)  Get screened for STIs, including gonorrhea and chlamydia, if: ? You are sexually active and are younger than 69 years of age. ? You are older than 70 years of age and your health care provider tells you that you are at risk for this type of infection. ? Your  sexual activity has changed since you were last screened, and you are at increased risk for chlamydia or gonorrhea. Ask your health care provider if you are at risk.  Ask your health care provider about whether you are at high risk for HIV. Your health care provider may recommend a prescription medicine to help prevent HIV infection. If you choose to take medicine to prevent HIV, you should first get tested for HIV. You should then be tested every 3 months for as long as  you are taking the medicine. Pregnancy  If you are about to stop having your period (premenopausal) and you may become pregnant, seek counseling before you get pregnant.  Take 400 to 800 micrograms (mcg) of folic acid every day if you become pregnant.  Ask for birth control (contraception) if you want to prevent pregnancy. Osteoporosis and menopause Osteoporosis is a disease in which the bones lose minerals and strength with aging. This can result in bone fractures. If you are 30 years old or older, or if you are at risk for osteoporosis and fractures, ask your health care provider if you should:  Be screened for bone loss.  Take a calcium or vitamin D supplement to lower your risk of fractures.  Be given hormone replacement therapy (HRT) to treat symptoms of menopause. Follow these instructions at home: Lifestyle  Do not use any products that contain nicotine or tobacco, such as cigarettes, e-cigarettes, and chewing tobacco. If you need help quitting, ask your health care provider.  Do not use street drugs.  Do not share needles.  Ask your health care provider for help if you need support or information about quitting drugs. Alcohol use  Do not drink alcohol if: ? Your health care provider tells you not to drink. ? You are pregnant, may be pregnant, or are planning to become pregnant.  If you drink alcohol: ? Limit how much you use to 0-1 drink a day. ? Limit intake if you are breastfeeding.  Be aware of how much alcohol is in your drink. In the [place] one drink equals one 12 oz bottle of beer (355 mL), one 5 oz glass of wine (148 mL), or one 1 oz glass of hard liquor (44 mL). General instructions  Schedule regular health, dental, and eye exams.  Stay current with your vaccines.  Tell your health care provider if: ? You often feel depressed. ? You have ever been abused or do not feel safe at home. Summary  Adopting a healthy lifestyle and getting preventive care are  important in promoting health and wellness.  Follow your health care provider's instructions about healthy diet, exercising, and getting tested or screened for diseases.  Follow your health care provider's instructions on monitoring your cholesterol and blood pressure. This information is not intended to replace advice given to you by your health care provider. Make sure you discuss any questions you have with your health care provider. Document Revised: 09/11/2018 Document Reviewed: 09/11/2018 Elsevier Patient Education  2021 [employer].

## 2020-12-23 ENCOUNTER — Other Ambulatory Visit: Payer: Self-pay

## 2020-12-23 ENCOUNTER — Ambulatory Visit: Payer: Medicare Other | Admitting: Podiatry

## 2020-12-23 ENCOUNTER — Ambulatory Visit: Payer: Medicare Other | Admitting: Rehabilitation

## 2020-12-23 DIAGNOSIS — L03039 Cellulitis of unspecified toe: Secondary | ICD-10-CM

## 2020-12-27 ENCOUNTER — Other Ambulatory Visit: Payer: Self-pay | Admitting: Internal Medicine

## 2020-12-27 DIAGNOSIS — E559 Vitamin D deficiency, unspecified: Secondary | ICD-10-CM

## 2020-12-27 DIAGNOSIS — R748 Abnormal levels of other serum enzymes: Secondary | ICD-10-CM

## 2020-12-27 DIAGNOSIS — N183 Chronic kidney disease, stage 3 unspecified: Secondary | ICD-10-CM

## 2020-12-27 DIAGNOSIS — I110 Hypertensive heart disease with heart failure: Secondary | ICD-10-CM

## 2020-12-27 DIAGNOSIS — Z79899 Other long term (current) drug therapy: Secondary | ICD-10-CM

## 2020-12-27 NOTE — Progress Notes (Signed)
Labs stable   including decrease  renal function  , blood sugar stable.  Alkaline phos  slightly up   Make sure taking vit d  3  Follow  labs in 6 months to make sure  renal function and alk phos is stable ( I will place orders but can be done  by oncology if needed)

## 2020-12-28 NOTE — Progress Notes (Signed)
  Subjective:  Patient ID: [name] Foster, female    DOB: [birth year],  MRN: [id number]  Chief Complaint  Patient presents with  . Nail Problem    Re-evaluation of bilateral hallux nail    70 y.o. female presents with the above complaint. History confirmed with patient.   Objective:  Physical Exam: warm, good capillary refill, no trophic changes or ulcerative lesions and normal DP and PT pulses.  Bilateral hallux nails appear to be healing well, no signs of infection currently, no pain Assessment:   1. Paronychia of toenail, unspecified laterality      Plan:  Patient was evaluated and treated and all questions answered.  Appears to be healing well, can resume regular bathing, no soaking or ointment at this point.  No indication for further antibiotics  No follow-ups on file.

## 2020-12-29 ENCOUNTER — Telehealth: Payer: Self-pay | Admitting: Internal Medicine

## 2020-12-29 NOTE — Telephone Encounter (Signed)
Patient is returning the call, please advise. CB is [phone]

## 2020-12-30 ENCOUNTER — Ambulatory Visit: Payer: Medicare Other | Admitting: Rehabilitation

## 2020-12-30 ENCOUNTER — Encounter: Payer: Self-pay | Admitting: Rehabilitation

## 2020-12-30 ENCOUNTER — Other Ambulatory Visit: Payer: Self-pay

## 2020-12-30 DIAGNOSIS — Z171 Estrogen receptor negative status [ER-]: Secondary | ICD-10-CM

## 2020-12-30 DIAGNOSIS — R293 Abnormal posture: Secondary | ICD-10-CM | POA: Diagnosis not present

## 2020-12-30 DIAGNOSIS — M25612 Stiffness of left shoulder, not elsewhere classified: Secondary | ICD-10-CM

## 2020-12-30 DIAGNOSIS — C50812 Malignant neoplasm of overlapping sites of left female breast: Secondary | ICD-10-CM

## 2020-12-30 DIAGNOSIS — I890 Lymphedema, not elsewhere classified: Secondary | ICD-10-CM | POA: Diagnosis not present

## 2020-12-30 NOTE — Therapy (Signed)
[place], [place], [address] Phone: [phone]   Fax:  [phone]  Physical Therapy Treatment  Patient Details  Name: [name] Foster MRN: [id number] Date of Birth: 1951-09-13 Referring Provider (PT): Dr. Lindi Adie   Encounter Date: 12/30/2020   PT End of Session - 12/30/20 1645    Visit Number 36    Number of Visits 42    Date for PT Re-Evaluation 02/10/21    PT Start Time 1600    PT Stop Time 1645    PT Time Calculation (min) 45 min    Activity Tolerance Patient tolerated treatment well    Behavior During Therapy [facility] for tasks assessed/performed           Past Medical History:  Diagnosis Date  . Allergic rhinitis   . Anemia   . Aortic valve disorders   . Arthritis   . Asthma   . Cancer [facility])    Breast Cancer  . CHF (congestive heart failure) ([place])   . Coronary artery disease   . Family history of adverse reaction to anesthesia    difficulty waking mother up after surgery  . Family history of breast cancer   . Family history of cervical cancer   . Family history of colon cancer   . Family history of throat cancer   . Heart murmur   . Hyperlipidemia   . Hypertension   . Long term (current) use of anticoagulants   . Other primary cardiomyopathies   . Peripheral vascular disease (HCC)    right leg  . Personal history of colonic polyps   . Personal history of venous thrombosis and embolism     Past Surgical History:  Procedure Laterality Date  . ABDOMINAL HYSTERECTOMY    . BREAST BIOPSY Left 07/2019  . CARDIAC CATHETERIZATION  2004  . COLON SURGERY     colonoscopy  . DILATION AND CURETTAGE OF UTERUS     several  . MASTECTOMY MODIFIED RADICAL Left 03/09/2020   Left Modified Radical Mastectomy (left mastectomy with axillary lymph node dissection)   . MASTECTOMY MODIFIED RADICAL Left 03/09/2020   Procedure: LEFT MODIFIED RADICAL MASTECTOMY;  Surgeon: Stark Klein, MD;   Location: [place];  Service: General;  Laterality: Left;  GEN AND PECTORAL BLOCK  . PORT-A-CATH REMOVAL N/A 08/31/2020   Procedure: REMOVAL PORT-A-CATH;  Surgeon: Stark Klein, MD;  Location: [place];  Service: General;  Laterality: N/A;  . PORTACATH PLACEMENT Left 08/18/2019   Procedure: INSERTION PORT-A-CATH;  Surgeon: Stark Klein, MD;  Location: [place];  Service: General;  Laterality: Left;  . Rt knee arthoscopic    . TEE WITHOUT CARDIOVERSION N/A 02/21/2013   Procedure: TRANSESOPHAGEAL ECHOCARDIOGRAM (TEE);  Surgeon: Lelon Perla, MD;  Location[facility] ENDOSCOPY;  Service: Cardiovascular;  Laterality: N/A;    There were no vitals filed for this visit.   Subjective Assessment - 12/30/20 1600    Subjective Night garment should be here Friday. They are still processing on the pump    Pertinent History Patient was diagnosed on 07/24/2019 with left grade III invasive ductal carcinoma breast cancer. ER/PR negative and HER2 positive with a Ki67 of 70%. neoadjuvant chemotherapy completed.  Lt Mastectomy 03/09/20 with 19 axillary nodes (0 positive) removed and 2 inframammary nodes removed.  All negative.  Radiation consultation on 05/04/20.    Currently in Pain? No/denies              [facility] PT Assessment - 12/30/20 0001  AROM   Left Shoulder Flexion 112 Degrees    Left Shoulder ABduction 94 Degrees             LYMPHEDEMA/ONCOLOGY QUESTIONNAIRE - 12/30/20 0001      Left Upper Extremity Lymphedema   At Axilla  44 cm    15 cm Proximal to Olecranon Process 41.9 cm    10 cm Proximal to Olecranon Process 39.4 cm    Olecranon Process 32 cm    15 cm Proximal to Ulnar Styloid Process 30.2 cm    10 cm Proximal to Ulnar Styloid Process 27.5 cm    Just Proximal to Ulnar Styloid Process 19.6 cm    Across Hand at [employer] 19.5 cm    At [place] of 2nd Digit 6 cm              Flowsheet Row Outpatient [facility]  Lymphedema  Life Impact Scale Total Score 38.24 %            [facility] Adult PT Treatment/Exercise - 12/30/20 0001      Manual Therapy   Manual Lymphatic Drainage (MLD) in supine: short neck, sternal and clavicular nodes, superficial and deep abdominals, Rt axillary nodes, and left inguinal nodes, anterior inter-axillary and Lt axillo-inguinal anastomosis, then Lt UE from proximal to distal and then upper arm reviewing and then in sidelying posterior interaxillary work and posterior arm    Compression Bandaging pt donned velcro garment    Passive ROM to the left shoulder into flexion, abduction and scaption, and ER                       PT Long Term Goals - 12/30/20 1648      PT LONG TERM GOAL #1   Title Pt will improve her Lt shoulder active flexion and abduction to at least 140 to improve overhead reaching    Baseline 50 / 60, 104/105 on 09/14/20, 115 on 11/05/20, on 11/18/20: Flex: 105 tight in the chest and abduction: Abd: 90,  on 12/30/20: flex: 112 and abd: 94    Time 6    Period Weeks    Status On-going      PT LONG TERM GOAL #2   Title Pt will decrease circumferential measurements by at least 2cm globally proximal to the wrist to demonstrate improved Lt UE edema    Baseline see notes    Time 6    Status On-going      PT LONG TERM GOAL #3   Title Pt will obtain appropriate compression garments for the Lt UE to manage lymphedema    Status Partially Met      PT LONG TERM GOAL #4   Title Pt will be ind with final HEP    Status On-going                 Plan - 12/30/20 1645    Clinical Impression Statement Pt with maintained reduction over the past 2 weeks and less edema in the lateral trunk today.  Continued with MLD and ROM work until night garment and pump arrive and pt is ind with self care.  Pt still benefits from PROM/STM to decrease tightness in the upper left quadrant and MLD to decrease fibrosis in the forearm. Will extend POC 1x per week up to 6 weeks.     Examination-Activity Limitations Dressing;Sleep;Reach Overhead;Carry    Examination-Participation Restrictions Yard Work;Cleaning;Laundry;Community Activity    Stability/Clinical  Decision Making Stable/Uncomplicated    Clinical Decision Making Low    Rehab Potential Excellent    PT Frequency 1x / week    PT Duration 6 weeks    PT Treatment/Interventions ADLs/Self Care Home Management;Therapeutic exercise;Patient/family education;Moist Heat;Therapeutic activities;Manual techniques;Joint Manipulations;Passive range of motion;Cryotherapy;Manual lymph drainage;Compression bandaging;Taping    PT Next Visit Plan any new pump updates since denial for appeal? nighttime garment arrive? Lt shoulder PROM, AAROM and Lt UE MLD, scapular strengthening    Consulted and Agree with Plan of Care Patient           Patient will benefit from skilled therapeutic intervention in order to improve the following deficits and impairments:  Postural dysfunction,Decreased knowledge of precautions,Impaired UE functional use,Pain,Decreased range of motion,Decreased strength,Increased edema  Visit Diagnosis: Lymphedema  Abnormal posture  Malignant neoplasm of overlapping sites of left breast in female, estrogen receptor negative (HCC)  Stiffness of left shoulder, not elsewhere classified     Problem List Patient Active Problem List   Diagnosis Date Noted  . Genetic testing 11/01/2020  . Breast cancer metastasized to axillary lymph node, left ([place]) 03/09/2020  . Coagulopathy ([place]) 12/05/2019  . Epistaxis, recurrent 12/05/2019  . Dehydration   . Non-intractable vomiting   . Intractable nausea and vomiting 09/21/2019  . Generalized weakness 09/21/2019  . AKI (acute kidney injury) ([place]) 09/21/2019  . Leukocytosis 09/21/2019  . Hypernatremia 09/21/2019  . Hyperchloremia 09/21/2019  . Thrombocytopenia ([place]) 09/21/2019  . Port-A-Cath in place 09/08/2019  . Family history of breast cancer   . Family history  of throat cancer   . Family history of cervical cancer   . Malignant neoplasm of overlapping sites of left breast in female, estrogen receptor negative ([place]) 08/01/2019  . Family history of colon cancer mom 11/05/2018  . Encounter for therapeutic drug monitoring 11/21/2013  . Asthma with bronchitis 09/01/2013  . Chest pain 01/01/2013  . Aortic insufficiency 10/25/2011  . Chronic anticoagulation 09/29/2011  . Nocturnal dyspnea 09/29/2011  . VITAMIN D DEFICIENCY 08/06/2009  . DVT 01/26/2009  . KNEE PAIN, RIGHT 06/19/2008  . EDEMA 06/19/2008  . ANKLE PAIN, LEFT 02/13/2008  . HYPERKALEMIA 08/20/2007  . HYPOKALEMIA 08/20/2007  . HLD (hyperlipidemia) 07/18/2007  . Cardiomyopathy, hypertensive ([place]) 07/18/2007  . Essential hypertension 06/26/2007  . Hypertensive heart disease with heart failure ([place]) 06/26/2007  . Congestive heart failure ([place]) 06/26/2007  . Allergic rhinitis, cause unspecified 06/26/2007  . ASTHMA 06/26/2007  . SYMPTOM, SYNCOPE AND COLLAPSE 06/26/2007  . HEADACHE 06/26/2007  . HEART MURMUR, HX OF 06/26/2007  . DVT, HX OF 06/26/2007    Stark Bray 12/30/2020, 4:50 PM  [place], [place], [address] Phone: [phone]   Fax:  [phone]  Name: Marielouise Amey MRN: [id number] Date of Birth: [birth year]

## 2020-12-30 NOTE — Telephone Encounter (Signed)
See result note.  

## 2021-01-11 ENCOUNTER — Encounter: Payer: Medicare Other | Admitting: Rehabilitation

## 2021-01-25 ENCOUNTER — Other Ambulatory Visit: Payer: Self-pay

## 2021-01-25 ENCOUNTER — Encounter: Payer: Self-pay | Admitting: Rehabilitation

## 2021-01-25 ENCOUNTER — Ambulatory Visit: Payer: Medicare Other | Attending: Hematology and Oncology | Admitting: Rehabilitation

## 2021-01-25 DIAGNOSIS — R293 Abnormal posture: Secondary | ICD-10-CM | POA: Insufficient documentation

## 2021-01-25 DIAGNOSIS — I890 Lymphedema, not elsewhere classified: Secondary | ICD-10-CM | POA: Insufficient documentation

## 2021-01-25 DIAGNOSIS — M25612 Stiffness of left shoulder, not elsewhere classified: Secondary | ICD-10-CM | POA: Insufficient documentation

## 2021-01-25 DIAGNOSIS — C50812 Malignant neoplasm of overlapping sites of left female breast: Secondary | ICD-10-CM | POA: Insufficient documentation

## 2021-01-25 DIAGNOSIS — Z171 Estrogen receptor negative status [ER-]: Secondary | ICD-10-CM | POA: Diagnosis not present

## 2021-01-25 NOTE — Therapy (Signed)
[place], [place], [address] Phone: [phone]   Fax:  [phone]  Physical Therapy Treatment  Patient Details  Name: [name] Foster MRN: [id number] Date of Birth: 01/06/1951 Referring Provider (PT): Dr. Lindi Adie   Encounter Date: 01/25/2021   PT End of Session - 01/25/21 1047    Visit Number 37    Number of Visits 42    Date for PT Re-Evaluation 02/10/21    PT Start Time 0959    PT Stop Time 1049    PT Time Calculation (min) 50 min    Activity Tolerance Patient tolerated treatment well    Behavior During Therapy [facility] for tasks assessed/performed           Past Medical History:  Diagnosis Date  . Allergic rhinitis   . Anemia   . Aortic valve disorders   . Arthritis   . Asthma   . Cancer [facility])    Breast Cancer  . CHF (congestive heart failure) ([place])   . Coronary artery disease   . Family history of adverse reaction to anesthesia    difficulty waking mother up after surgery  . Family history of breast cancer   . Family history of cervical cancer   . Family history of colon cancer   . Family history of throat cancer   . Heart murmur   . Hyperlipidemia   . Hypertension   . Long term (current) use of anticoagulants   . Other primary cardiomyopathies   . Peripheral vascular disease (HCC)    right leg  . Personal history of colonic polyps   . Personal history of venous thrombosis and embolism     Past Surgical History:  Procedure Laterality Date  . ABDOMINAL HYSTERECTOMY    . BREAST BIOPSY Left 07/2019  . CARDIAC CATHETERIZATION  2004  . COLON SURGERY     colonoscopy  . DILATION AND CURETTAGE OF UTERUS     several  . MASTECTOMY MODIFIED RADICAL Left 03/09/2020   Left Modified Radical Mastectomy (left mastectomy with axillary lymph node dissection)   . MASTECTOMY MODIFIED RADICAL Left 03/09/2020   Procedure: LEFT MODIFIED RADICAL MASTECTOMY;  Surgeon: Stark Klein, MD;   Location: [place];  Service: General;  Laterality: Left;  GEN AND PECTORAL BLOCK  . PORT-A-CATH REMOVAL N/A 08/31/2020   Procedure: REMOVAL PORT-A-CATH;  Surgeon: Stark Klein, MD;  Location: [place];  Service: General;  Laterality: N/A;  . PORTACATH PLACEMENT Left 08/18/2019   Procedure: INSERTION PORT-A-CATH;  Surgeon: Stark Klein, MD;  Location: [place];  Service: General;  Laterality: Left;  . Rt knee arthoscopic    . TEE WITHOUT CARDIOVERSION N/A 02/21/2013   Procedure: TRANSESOPHAGEAL ECHOCARDIOGRAM (TEE);  Surgeon: Lelon Perla, MD;  Location: [facility] ENDOSCOPY;  Service: Cardiovascular;  Laterality: N/A;    There were no vitals filed for this visit.   Subjective Assessment - 01/25/21 1000    Subjective I got my night garment; I sleep in it. I haven't heard anything back regarding the pump. My arm feels much softer    Pertinent History Patient was diagnosed on 07/24/2019 with left grade III invasive ductal carcinoma breast cancer. ER/PR negative and HER2 positive with a Ki67 of 70%. neoadjuvant chemotherapy completed.  Lt Mastectomy 03/09/20 with 19 axillary nodes (0 positive) removed and 2 inframammary nodes removed.  All negative.  Radiation consultation on 05/04/20.    Currently in Pain? No/denies  [facility] PT Assessment - 01/25/21 0001      AROM   Left Shoulder Flexion 130 Degrees    Left Shoulder ABduction 123 Degrees             LYMPHEDEMA/ONCOLOGY QUESTIONNAIRE - 01/25/21 0001      Left Upper Extremity Lymphedema   At Axilla  43.9 cm    15 cm Proximal to Olecranon Process 41.3 cm    10 cm Proximal to Olecranon Process 38.9 cm    Olecranon Process 31.4 cm    15 cm Proximal to Ulnar Styloid Process 30.6 cm    10 cm Proximal to Ulnar Styloid Process 27.7 cm    Just Proximal to Ulnar Styloid Process 19.7 cm    Across Hand at [employer] 19.8 cm    At Max of 2nd Digit 6.2 cm              Flowsheet Row Outpatient Rehab from 04/22/2020 in Outpatient  [facility] [facility]  Lymphedema Life Impact Scale Total Score 38.24 %            [facility] Adult PT Treatment/Exercise - 01/25/21 0001      Manual Therapy   Edema Management emailed lymphapress regarding pump    Manual Lymphatic Drainage (MLD) in supine: short neck, sternal and clavicular nodes, superficial and deep abdominals, Rt axillary nodes, and left inguinal nodes, anterior inter-axillary and Lt axillo-inguinal anastomosis, then Lt UE from proximal to distal and then upper arm reviewing and then in sidelying posterior interaxillary work and posterior arm                       PT Long Term Goals - 01/25/21 1055      PT LONG TERM GOAL #1   Title Pt will improve her Lt shoulder active flexion and abduction to at least 140 to improve overhead reaching    Baseline 50 / 60, 104/105 on 09/14/20, 115 on 11/05/20, on 11/18/20: Flex: 105 tight in the chest and abduction: Abd: 90,  on 12/30/20: flex: 112 and abd: 94, 01/25/21: 130/123    Status Partially Met      PT LONG TERM GOAL #2   Title Pt will decrease circumferential measurements by at least 2cm globally proximal to the wrist to demonstrate improved Lt UE edema    Baseline see notes    Status Partially Met      PT LONG TERM GOAL #3   Title Pt will obtain appropriate compression garments for the Lt UE to manage lymphedema    Status Achieved      PT LONG TERM GOAL #4   Title Pt will be ind with final HEP    Status Achieved                 Plan - 01/25/21 1047    Clinical Impression Statement Pt has maintained measurements in the lower arm and decreased in the upper arm by around 0.5cm.  Pt also demonstrates much improved skin at the medial forearm with less fibrosis and firmness and pt is pleased with decreased heaviness.  Pt has also improved AROM greatly since last visit and feels like she has turned a corner with her lymphedema.  Pt is still awaiting pump news and we discussed continuing with  lymphapress vs switching to flexitouch options.  Pt would still benefit from a pump due to chronic lymphedema, UE heaviness, increased risk of infection, truncal edema, and hyperplasia of adipose tissue.  Pt has exceeded greatly the 4 weeks of conservative care and demonstrates the need for continued MLD.    PT Frequency 1x / week    PT Duration 6 weeks    PT Treatment/Interventions ADLs/Self Care Home Management;Therapeutic exercise;Patient/family education;Moist Heat;Therapeutic activities;Manual techniques;Joint Manipulations;Passive range of motion;Cryotherapy;Manual lymph drainage;Compression bandaging;Taping    PT Next Visit Plan any new pump updates since denial for appeal?    Consulted and Agree with Plan of Care Patient           Patient will benefit from skilled therapeutic intervention in order to improve the following deficits and impairments:     Visit Diagnosis: Lymphedema  Abnormal posture  Malignant neoplasm of overlapping sites of left breast in female, estrogen receptor negative (HCC)  Stiffness of left shoulder, not elsewhere classified     Problem List Patient Active Problem List   Diagnosis Date Noted  . Genetic testing 11/01/2020  . Breast cancer metastasized to axillary lymph node, left ([place]) 03/09/2020  . Coagulopathy ([place]) 12/05/2019  . Epistaxis, recurrent 12/05/2019  . Dehydration   . Non-intractable vomiting   . Intractable nausea and vomiting 09/21/2019  . Generalized weakness 09/21/2019  . AKI (acute kidney injury) ([place]) 09/21/2019  . Leukocytosis 09/21/2019  . Hypernatremia 09/21/2019  . Hyperchloremia 09/21/2019  . Thrombocytopenia ([place]) 09/21/2019  . Port-A-Cath in place 09/08/2019  . Family history of breast cancer   . Family history of throat cancer   . Family history of cervical cancer   . Malignant neoplasm of overlapping sites of left breast in female, estrogen receptor negative ([place]) 08/01/2019  . Family history of colon cancer mom  11/05/2018  . Encounter for therapeutic drug monitoring 11/21/2013  . Asthma with bronchitis 09/01/2013  . Chest pain 01/01/2013  . Aortic insufficiency 10/25/2011  . Chronic anticoagulation 09/29/2011  . Nocturnal dyspnea 09/29/2011  . VITAMIN D DEFICIENCY 08/06/2009  . DVT 01/26/2009  . KNEE PAIN, RIGHT 06/19/2008  . EDEMA 06/19/2008  . ANKLE PAIN, LEFT 02/13/2008  . HYPERKALEMIA 08/20/2007  . HYPOKALEMIA 08/20/2007  . HLD (hyperlipidemia) 07/18/2007  . Cardiomyopathy, hypertensive ([place]) 07/18/2007  . Essential hypertension 06/26/2007  . Hypertensive heart disease with heart failure ([place]) 06/26/2007  . Congestive heart failure ([place]) 06/26/2007  . Allergic rhinitis, cause unspecified 06/26/2007  . ASTHMA 06/26/2007  . SYMPTOM, SYNCOPE AND COLLAPSE 06/26/2007  . HEADACHE 06/26/2007  . HEART MURMUR, HX OF 06/26/2007  . DVT, HX OF 06/26/2007    Stark Bray 01/25/2021, 10:56 AM  [place], [place], [address] Phone: [phone]   Fax:  [phone]  Name: Terrie Haring MRN: [id number] Date of Birth: 03-15-1951

## 2021-01-27 ENCOUNTER — Ambulatory Visit (INDEPENDENT_AMBULATORY_CARE_PROVIDER_SITE_OTHER): Payer: Medicare Other | Admitting: General Practice

## 2021-01-27 ENCOUNTER — Other Ambulatory Visit: Payer: Self-pay

## 2021-01-27 DIAGNOSIS — Z7901 Long term (current) use of anticoagulants: Secondary | ICD-10-CM | POA: Diagnosis not present

## 2021-01-27 LAB — POCT INR: INR: 2.7 (ref 2.0–3.0)

## 2021-01-27 NOTE — Patient Instructions (Addendum)
Pre visit review using our clinic review tool, if applicable. No additional management support is needed unless otherwise documented below in the visit note.  Please take 1 (5 mg) tablet daily.  Re-check in 6 weeks.

## 2021-01-27 NOTE — Progress Notes (Signed)
Medical screening examination/treatment/procedure(s) were performed by non-physician practitioner and as supervising physician I was immediately available for consultation/collaboration. I agree with above. Clydie Dillen, MD   

## 2021-02-01 ENCOUNTER — Ambulatory Visit: Payer: Medicare Other | Attending: Internal Medicine

## 2021-02-01 ENCOUNTER — Other Ambulatory Visit (HOSPITAL_BASED_OUTPATIENT_CLINIC_OR_DEPARTMENT_OTHER): Payer: Self-pay

## 2021-02-01 ENCOUNTER — Other Ambulatory Visit: Payer: Self-pay

## 2021-02-01 DIAGNOSIS — Z23 Encounter for immunization: Secondary | ICD-10-CM

## 2021-02-01 MED ORDER — PFIZER-BIONT COVID-19 VAC-TRIS 30 MCG/0.3ML IM SUSP
INTRAMUSCULAR | 0 refills | Status: DC
Start: 2021-02-01 — End: 2021-04-21
  Filled 2021-02-01: qty 0.3, 1d supply, fill #0

## 2021-02-01 NOTE — Progress Notes (Signed)
   Covid-19 Vaccination Clinic  Name:  Raahi Korber    MRN: [id number] DOB: 21-May-1951  02/01/2021  Ms. Moody-Haskins was observed post Covid-19 immunization for 15 minutes without incident. She was provided with Vaccine Information Sheet and instruction to access the V-Safe system.   Ms. Cuyler was instructed to call 911 with any severe reactions post vaccine: Marland Kitchen Difficulty breathing  . Swelling of face and throat  . A fast heartbeat  . A bad rash all over body  . Dizziness and weakness   Immunizations Administered    Name Date Dose VIS Date Route   PFIZER Comrnaty(Gray TOP) Covid-19 Vaccine 02/01/2021 11:28 AM 0.3 mL 09/09/2020 Intramuscular   Manufacturer: [employer], [employer]   Lot: [id number]   NDC: [id number]

## 2021-02-16 ENCOUNTER — Inpatient Hospital Stay: Payer: Medicare Other | Attending: Hematology and Oncology | Admitting: Hematology and Oncology

## 2021-02-16 ENCOUNTER — Other Ambulatory Visit: Payer: Self-pay

## 2021-02-16 ENCOUNTER — Telehealth: Payer: Self-pay | Admitting: Hematology and Oncology

## 2021-02-16 DIAGNOSIS — Z9012 Acquired absence of left breast and nipple: Secondary | ICD-10-CM | POA: Insufficient documentation

## 2021-02-16 DIAGNOSIS — C50812 Malignant neoplasm of overlapping sites of left female breast: Secondary | ICD-10-CM

## 2021-02-16 DIAGNOSIS — Z853 Personal history of malignant neoplasm of breast: Secondary | ICD-10-CM | POA: Diagnosis not present

## 2021-02-16 DIAGNOSIS — Z923 Personal history of irradiation: Secondary | ICD-10-CM | POA: Diagnosis not present

## 2021-02-16 DIAGNOSIS — Z9221 Personal history of antineoplastic chemotherapy: Secondary | ICD-10-CM | POA: Insufficient documentation

## 2021-02-16 DIAGNOSIS — Z171 Estrogen receptor negative status [ER-]: Secondary | ICD-10-CM | POA: Diagnosis not present

## 2021-02-16 DIAGNOSIS — Z79899 Other long term (current) drug therapy: Secondary | ICD-10-CM | POA: Diagnosis not present

## 2021-02-16 DIAGNOSIS — Z7901 Long term (current) use of anticoagulants: Secondary | ICD-10-CM | POA: Insufficient documentation

## 2021-02-16 NOTE — Progress Notes (Signed)
Patient Care Team: Panosh, Standley Brooking, MD as PCP - General Stanford Breed Denice Bors, MD as PCP - Cardiology (Cardiology) Stanford Breed Denice Bors, MD (Cardiology) Susa Day, MD (Orthopedic Surgery) Mauro Kaufmann, RN as Oncology Nurse Navigator Rockwell Germany, RN as Oncology Nurse Navigator Nicholas Lose, MD as Consulting Physician (Hematology and Oncology)  DIAGNOSIS:  Encounter Diagnosis  Name Primary?  . Malignant neoplasm of overlapping sites of left breast in female, estrogen receptor negative ([place])     SUMMARY OF ONCOLOGIC HISTORY: Oncology History  Malignant neoplasm of overlapping sites of left breast in female, estrogen receptor negative ([place])  08/01/2019 Initial Diagnosis   Patient palpated left breast lump and skin thickening. Mammogram showed a 3.6cm mass at 3:30 position, a 0.9cm mass at 3:00 position, a 0.7cm mass at 2:00 position, a 0.5cm mass at 2:00 position, a 0.3cm mass at 1:00 position, and a 0.4cm mass at 1:00 position, with 4 abnormal left axillary lymph nodes. Biopsy showed IDC, grade 3, HER-2 + (3+), ER/PR -, Ki67 70%, in the breast and lymph nodes.    08/06/2019 Cancer Staging   Staging form: Breast, AJCC 8th Edition - Clinical: Stage IIIB (cT4, cN1, cM0, G3, ER-, PR-, HER2+) - Signed by Nicholas Lose, MD on 08/06/2019   08/19/2019 - 01/02/2020 Chemotherapy   The patient had dexamethasone (DECADRON) 4 MG tablet, 4 mg (100 % of original dose 4 mg), Oral, 2 times daily, 1 of 1 cycle, Start date: 08/06/2019, End date: 01/19/2020 Dose modification: 4 mg (original dose 4 mg, Cycle 0) palonosetron (ALOXI) injection 0.25 mg, 0.25 mg, Intravenous,  Once, 6 of 6 cycles Administration: 0.25 mg (08/19/2019), 0.25 mg (09/08/2019), 0.25 mg (12/12/2019), 0.25 mg (01/02/2020), 0.25 mg (10/31/2019), 0.25 mg (11/22/2019) pegfilgrastim-cbqv (UDENYCA) injection 6 mg, 6 mg, Subcutaneous, Once, 2 of 2 cycles Administration: 6 mg (08/21/2019), 6 mg (09/10/2019) CARBOplatin (PARAPLATIN) 480 mg in  sodium chloride 0.9 % 250 mL chemo infusion, 480 mg (110.9 % of original dose 429.6 mg), Intravenous,  Once, 2 of 2 cycles Dose modification:   (original dose 429.6 mg, Cycle 1) Administration: 480 mg (08/19/2019), 340 mg (09/08/2019) DOCEtaxel (TAXOTERE) 160 mg in sodium chloride 0.9 % 250 mL chemo infusion, 75 mg/m2 = 160 mg, Intravenous,  Once, 6 of 6 cycles Dose modification: 60 mg/m2 (original dose 75 mg/m2, Cycle 2, Reason: Dose not tolerated), 50 mg/m2 (original dose 75 mg/m2, Cycle 5, Reason: Dose not tolerated) Administration: 160 mg (08/19/2019), 130 mg (09/08/2019), 110 mg (12/12/2019), 110 mg (01/02/2020), 110 mg (10/31/2019), 110 mg (11/22/2019) pertuzumab (PERJETA) 420 mg in sodium chloride 0.9 % 250 mL chemo infusion, 420 mg (100 % of original dose 420 mg), Intravenous, Once, 2 of 2 cycles Dose modification: 420 mg (original dose 420 mg, Cycle 1, Reason: Provider Judgment) Administration: 420 mg (08/19/2019), 420 mg (09/08/2019) fosaprepitant (EMEND) 150 mg, dexamethasone (DECADRON) 12 mg in sodium chloride 0.9 % 145 mL IVPB, , Intravenous,  Once, 3 of 3 cycles Administration:  (08/19/2019),  (09/08/2019),  (10/31/2019) trastuzumab-anns (KANJINTI) 750 mg in sodium chloride 0.9 % 250 mL chemo infusion, 756 mg (100 % of original dose 8 mg/kg), Intravenous,  Once, 6 of 6 cycles Dose modification: 8 mg/kg (original dose 8 mg/kg, Cycle 1, Reason: Other (see comments), Comment: change to approved brand), 6 mg/kg (original dose 6 mg/kg, Cycle 2, Reason: Other (see comments), Comment: brand change per insurance) Administration: 750 mg (08/19/2019), 567 mg (09/08/2019), 567 mg (10/31/2019), 567 mg (11/22/2019), 567 mg (12/12/2019), 504 mg (01/02/2020)  for  chemotherapy treatment.    09/21/2019 - 10/03/2019 Hospital Admission   Intractable nausea and vomiting   01/23/2020 -  Chemotherapy   The patient had trastuzumab-anns (KANJINTI) 504 mg in sodium chloride 0.9 % 250 mL chemo infusion, 6 mg/kg = 504 mg (100 %  of original dose 6 mg/kg), Intravenous,  Once, 10 of 10 cycles Dose modification: 6 mg/kg (original dose 6 mg/kg, Cycle 1, Reason: Other (see comments), Comment: biosimilar conversion; auth'd for Kanjinti) Administration: 504 mg (01/23/2020), 504 mg (02/13/2020), 504 mg (03/05/2020), 504 mg (04/16/2020), 504 mg (05/06/2020), 504 mg (05/27/2020), 504 mg (06/17/2020), 504 mg (07/08/2020), 504 mg (07/29/2020), 504 mg (08/19/2020)  for chemotherapy treatment.    03/09/2020 Surgery   Left mastectomy and left axillary lymph node dissection ([place]): no residual carcinoma, 2 negative intramammary lymph nodes, and 19 negative left axillary lymph nodes.    05/27/2020 - 07/12/2020 Radiation Therapy   Adjuvant left chest wall radiation     CHIEF COMPLIANT: Follow-up and surveillance of history of left breast cancer  INTERVAL HISTORY: [name] Foster is a 70 year old above-mentioned history of left breast cancer who was treated with neoadjuvant chemotherapy followed by left mastectomy which showed a complete pathologic response.  She then went on to receive radiation and is currently on surveillance.  She is still continuing to have some fatigue issues.  Denies any nausea vomiting.  She has not been able to exercise as much as she wishes to because of shortness of breath with exertion.  She is still trying to exercise regularly.  Left arm continues to be a problem with profound lymphedema.  She is awaiting for a lymphedema pump to arrive.   ALLERGIES:  is allergic to penicillins, tetanus toxoid, and aspirin.  MEDICATIONS:  Current Outpatient Medications  Medication Sig Dispense Refill  . Albuterol Sulfate (PROAIR RESPICLICK) 324 (90 Base) MCG/ACT AEPB Inhale 2 puffs into the lungs every 6 (six) hours as needed. (Patient taking differently: Inhale 2 puffs into the lungs every 6 (six) hours as needed (SOB / wheezing).) 2 each 1  . amLODipine (NORVASC) 10 MG tablet Take 1 tablet (10 mg total) by mouth daily. 90  tablet 3  . atorvastatin (LIPITOR) 20 MG tablet TAKE 1 TABLET BY MOUTH  DAILY (Patient taking differently: Take 20 mg by mouth at bedtime.) 90 tablet 3  . Carboxymethylcellul-Glycerin (CLEAR EYES FOR DRY EYES) 1-0.25 % SOLN Place 1 drop into both eyes 3 (three) times daily as needed (dry eyes).     . Cholecalciferol (VITAMIN D) 50 MCG (2000 UT) tablet Take 2,000 Units by mouth daily.    Marland Kitchen COVID-19 mRNA Vac-TriS, Pfizer, (PFIZER-BIONT COVID-19 VAC-TRIS) SUSP injection Inject into the muscle. 0.3 mL 0  . fexofenadine (ALLEGRA) 180 MG tablet Take 180 mg by mouth daily as needed for allergies.     . fluticasone (FLONASE) 50 MCG/ACT nasal spray USE 2 SPRAYS IN EACH NOSTRIL DAILY. PT NEEDS TO SCHEDULE A FOLLOW UP APPT BEFORE NEXT REFILL. (Patient taking differently: Place 2 sprays into both nostrils daily.) 16 g 0  . furosemide (LASIX) 40 MG tablet TAKE 1 TABLET BY MOUTH  DAILY 90 tablet 3  . hydrALAZINE (APRESOLINE) 50 MG tablet Take 0.5 tablets (25 mg total) by mouth 3 (three) times daily. 135 tablet 3  . methocarbamol (ROBAXIN) 500 MG tablet Take 1 tablet (500 mg total) by mouth every 6 (six) hours as needed for muscle spasms. 20 tablet 1  . metoprolol tartrate (LOPRESSOR) 50 MG tablet TAKE 1 TABLET BY MOUTH  TWICE DAILY (Patient taking differently: Take 50 mg by mouth in the morning and at bedtime.) 180 tablet 3  . oxyCODONE (OXY IR/ROXICODONE) 5 MG immediate release tablet Take 0.5-1 tablets (2.5-5 mg total) by mouth every 6 (six) hours as needed for severe pain. 8 tablet 0  . potassium chloride SA (KLOR-CON) 20 MEQ tablet Take 20 mEq by mouth daily.    . sodium chloride (OCEAN) 0.65 % SOLN nasal spray Place 1 spray into both nostrils as needed for congestion.    . traMADol (ULTRAM) 50 MG tablet Take 1 tablet (50 mg total) by mouth every 6 (six) hours as needed (mild pain). 20 tablet 0  . warfarin (COUMADIN) 5 MG tablet TAKE 1 TABLET BY MOUTH  DAILY OR AS DIRECTED BY  ANTICOAGULATION CLINIC (Patient  taking differently: Take 5 mg by mouth at bedtime. TAKE 1 TABLET BY MOUTH  DAILY OR AS DIRECTED BY  ANTICOAGULATION CLINIC) 100 tablet 3   No current facility-administered medications for this visit.    PHYSICAL EXAMINATION: ECOG PERFORMANCE STATUS: 1 - Symptomatic but completely ambulatory  Vitals:   02/16/21 1037  BP: (!) 152/60  Pulse: (!) 55  Resp: 16  Temp: 97.7 F (36.5 C)  SpO2: 100%   Filed Weights   02/16/21 1037  Weight: 220 lb 1.6 oz (99.8 kg)    BREAST: No palpable masses or nodules in either right or left breasts. No palpable axillary supraclavicular or infraclavicular adenopathy no breast tenderness or nipple discharge. (exam performed in the presence of a chaperone)  LABORATORY DATA:  I have reviewed the data as listed CMP Latest Ref Rng & Units 12/20/2020 08/31/2020 08/19/2020  Glucose 70 - 99 mg/dL 90 102(H) 88  BUN 6 - 23 mg/dL '21 14 19  ' Creatinine 0.40 - 1.20 mg/dL 1.72(H) 1.87(H) 1.97(H)  Sodium 135 - 145 mEq/L 142 139 142  Potassium 3.5 - 5.1 mEq/L 3.8 3.7 4.1  Chloride 96 - 112 mEq/L 103 106 106  CO2 19 - 32 mEq/L '29 24 28  ' Calcium 8.4 - 10.5 mg/dL 9.5 8.9 9.1  Total Protein 6.0 - 8.3 g/dL 7.2 - 7.2  Total Bilirubin 0.2 - 1.2 mg/dL 0.4 - 0.4  Alkaline Phos 39 - 117 U/L 128(H) - 126  AST 0 - 37 U/L 16 - 21  ALT 0 - 35 U/L 16 - 17    Lab Results  Component Value Date   WBC 5.9 12/20/2020   HGB 12.7 12/20/2020   HCT 38.0 12/20/2020   MCV 87.1 12/20/2020   PLT 230.0 12/20/2020   NEUTROABS 3.4 12/20/2020    ASSESSMENT & PLAN:  Malignant neoplasm of overlapping sites of left breast in female, estrogen receptor negative ([place]) 07/31/2020:Patient palpated left breast lump and skin thickening. Mammogram showed a 3.6cm mass at 3:30 position, a 0.9cm mass at 3:00 position, a 0.7cm mass at 2:00 position, a 0.5cm mass at 2:00 position, a 0.3cm mass at 1:00 position, and a 0.4cm mass at 1:00 position, with 4 abnormal left axillary lymph nodes. Biopsy showed  IDC, grade 3, HER-2 + (3+), ER/PR -, Ki67 70%, in the breast and lymph nodes.  Treatment plan 1. Neoadjuvant chemotherapy with [place] Perjeta 6 cyclescompleted 01/02/2020 followed by Herceptinmaintenance completed 08/19/20 2. 03/09/2020:Left mastectomy and left axillary lymph node dissection ([place]): no residual carcinoma, 2 negative intramammary lymph nodes, and 19 negative left axillary lymph nodes. 3. Followed by adjuvant radiation therapycompleted 07/12/2020 --------------------------------------------------------------------------------------------------------------------------------------------- Left arm lymphedema:She is waiting for a lymphedema pump to arrive.  She  is still working with physical therapy.   Breast Cancer Surveillance: 09/28/20: right breast mammogram: Benign, density B 02/16/21: Breast exam: Benign  Return to clinic in 1 year for surveillance check and follow-up. We will set her up for a follow-up in February 2023 so that she can alternate with Dr. Barry Dienes and myself.    No orders of the defined types were placed in this encounter.  The patient has a good understanding of the overall plan. she agrees with it. she will call with any problems that may develop before the next visit here. Total time spent: 30 mins including face to face time and time spent for planning, charting and co-ordination of care   Harriette Ohara, MD 02/16/21

## 2021-02-16 NOTE — Telephone Encounter (Signed)
Scheduled appointment per 05/18 los. Patient is aware.

## 2021-02-16 NOTE — Assessment & Plan Note (Signed)
07/31/2020:Patient palpated left breast lump and skin thickening. Mammogram showed a 3.6cm mass at 3:30 position, a 0.9cm mass at 3:00 position, a 0.7cm mass at 2:00 position, a 0.5cm mass at 2:00 position, a 0.3cm mass at 1:00 position, and a 0.4cm mass at 1:00 position, with 4 abnormal left axillary lymph nodes. Biopsy showed IDC, grade 3, HER-2 + (3+), ER/PR -, Ki67 70%, in the breast and lymph nodes.  Treatment plan 1. Neoadjuvant chemotherapy with [place] Perjeta 6 cyclescompleted 01/02/2020 followed by Herceptinmaintenance completed 08/19/20 2. 03/09/2020:Left mastectomy and left axillary lymph node dissection ([place]): no residual carcinoma, 2 negative intramammary lymph nodes, and 19 negative left axillary lymph nodes. 3. Followed by adjuvant radiation therapycompleted 07/12/2020 --------------------------------------------------------------------------------------------------------------------------------------------- Left arm lymphedema:Patient is using Velcro'straps to keep the fluid down and it is working very well. She is working with physical therapy.   Breast Cancer Surveillance: 09/28/20: right breast mammogram: Benign, density B 02/16/21: Breast exam: Benign  Return to clinic in 1 year for surveillance check and follow-up.

## 2021-03-01 ENCOUNTER — Other Ambulatory Visit: Payer: Self-pay

## 2021-03-01 ENCOUNTER — Ambulatory Visit (INDEPENDENT_AMBULATORY_CARE_PROVIDER_SITE_OTHER): Payer: Medicare Other

## 2021-03-01 VITALS — Wt 220.0 lb

## 2021-03-01 DIAGNOSIS — Z1211 Encounter for screening for malignant neoplasm of colon: Secondary | ICD-10-CM

## 2021-03-01 DIAGNOSIS — Z800 Family history of malignant neoplasm of digestive organs: Secondary | ICD-10-CM

## 2021-03-01 DIAGNOSIS — Z0000 Encounter for general adult medical examination without abnormal findings: Secondary | ICD-10-CM | POA: Diagnosis not present

## 2021-03-01 NOTE — Progress Notes (Signed)
Virtual Visit via Telephone Note  I connected with  [name] Foster on 03/01/21 at 11:45 AM EDT by telephone and verified that I am speaking with the correct person using two identifiers.  Location: Patient: Home Provider: Office Persons participating in the virtual visit: patient/Nurse Health Advisor   I discussed the limitations, risks, security and privacy concerns of performing an evaluation and management service by telephone and the availability of in person appointments. The patient expressed understanding and agreed to proceed.  Interactive audio and video telecommunications were attempted between this nurse and patient, however failed, due to patient having technical difficulties OR patient did not have access to video capability.  We continued and completed visit with audio only.  Some vital signs may be absent or patient reported.   Willette Brace, LPN    Subjective:   [name] Foster is a 70 y.o. female who presents for an Initial Medicare Annual Wellness Visit.  Review of Systems     Cardiac Risk Factors include: advanced age (>29men, >64 women);hypertension;dyslipidemia;obesity (BMI >30kg/m2)     Objective:    Today's Vitals   03/01/21 1148  Weight: 220 lb 0.3 oz (99.8 kg)   Body mass index is 34.98 kg/m.  Advanced Directives 03/01/2021 07/29/2020 07/08/2020 06/17/2020 05/06/2020 05/04/2020 04/22/2020  Does Patient Have a Medical Advance Directive? No No No No No No No  Would patient like information on creating a medical advance directive? Yes (MAU/Ambulatory/Procedural Areas - Information given) No - Patient declined No - Patient declined No - Patient declined No - Patient declined - No - Patient declined    Current Medications (verified) Outpatient Encounter Medications as of 03/01/2021  Medication Sig  . Albuterol Sulfate (PROAIR RESPICLICK) 500 (90 Base) MCG/ACT AEPB Inhale 2 puffs into the lungs every 6 (six) hours as needed. (Patient taking  differently: Inhale 2 puffs into the lungs every 6 (six) hours as needed (SOB / wheezing).)  . amLODipine (NORVASC) 10 MG tablet Take 1 tablet (10 mg total) by mouth daily.  Marland Kitchen atorvastatin (LIPITOR) 20 MG tablet TAKE 1 TABLET BY MOUTH  DAILY (Patient taking differently: Take 20 mg by mouth at bedtime.)  . Carboxymethylcellul-Glycerin (CLEAR EYES FOR DRY EYES) 1-0.25 % SOLN Place 1 drop into both eyes 3 (three) times daily as needed (dry eyes).   . Cholecalciferol (VITAMIN D) 50 MCG (2000 UT) tablet Take 2,000 Units by mouth daily.  . fexofenadine (ALLEGRA) 180 MG tablet Take 180 mg by mouth daily as needed for allergies.   . fluticasone (FLONASE) 50 MCG/ACT nasal spray USE 2 SPRAYS IN EACH NOSTRIL DAILY. PT NEEDS TO SCHEDULE A FOLLOW UP APPT BEFORE NEXT REFILL. (Patient taking differently: Place 2 sprays into both nostrils daily.)  . hydrALAZINE (APRESOLINE) 50 MG tablet Take 0.5 tablets (25 mg total) by mouth 3 (three) times daily.  . methocarbamol (ROBAXIN) 500 MG tablet Take 1 tablet (500 mg total) by mouth every 6 (six) hours as needed for muscle spasms.  . metoprolol tartrate (LOPRESSOR) 50 MG tablet TAKE 1 TABLET BY MOUTH  TWICE DAILY (Patient taking differently: Take 50 mg by mouth in the morning and at bedtime.)  . sodium chloride (OCEAN) 0.65 % SOLN nasal spray Place 1 spray into both nostrils as needed for congestion.  . traMADol (ULTRAM) 50 MG tablet Take 1 tablet (50 mg total) by mouth every 6 (six) hours as needed (mild pain).  Marland Kitchen warfarin (COUMADIN) 5 MG tablet TAKE 1 TABLET BY MOUTH  DAILY OR AS DIRECTED BY  ANTICOAGULATION CLINIC (Patient taking differently: Take 5 mg by mouth at bedtime. TAKE 1 TABLET BY MOUTH  DAILY OR AS DIRECTED BY  ANTICOAGULATION CLINIC)  . COVID-19 mRNA Vac-TriS, Pfizer, (PFIZER-BIONT COVID-19 VAC-TRIS) SUSP injection Inject into the muscle.  . furosemide (LASIX) 40 MG tablet TAKE 1 TABLET BY MOUTH  DAILY (Patient not taking: Reported on 03/01/2021)  . potassium  chloride SA (KLOR-CON) 20 MEQ tablet Take 20 mEq by mouth daily. (Patient not taking: Reported on 03/01/2021)  . [DISCONTINUED] prochlorperazine (COMPAZINE) 10 MG tablet Take 1 tablet (10 mg total) by mouth every 6 (six) hours as needed (Nausea or vomiting).   No facility-administered encounter medications on file as of 03/01/2021.    Allergies (verified) Penicillins, Tetanus toxoid, and Aspirin   History: Past Medical History:  Diagnosis Date  . Allergic rhinitis   . Anemia   . Aortic valve disorders   . Arthritis   . Asthma   . Cancer [facility])    Breast Cancer  . CHF (congestive heart failure) ([place])   . Coronary artery disease   . Family history of adverse reaction to anesthesia    difficulty waking mother up after surgery  . Family history of breast cancer   . Family history of cervical cancer   . Family history of colon cancer   . Family history of throat cancer   . Heart murmur   . Hyperlipidemia   . Hypertension   . Long term (current) use of anticoagulants   . Other primary cardiomyopathies   . Peripheral vascular disease (HCC)    right leg  . Personal history of colonic polyps   . Personal history of venous thrombosis and embolism    Past Surgical History:  Procedure Laterality Date  . ABDOMINAL HYSTERECTOMY    . BREAST BIOPSY Left 07/2019  . CARDIAC CATHETERIZATION  2004  . COLON SURGERY     colonoscopy  . DILATION AND CURETTAGE OF UTERUS     several  . MASTECTOMY MODIFIED RADICAL Left 03/09/2020   Left Modified Radical Mastectomy (left mastectomy with axillary lymph node dissection)   . MASTECTOMY MODIFIED RADICAL Left 03/09/2020   Procedure: LEFT MODIFIED RADICAL MASTECTOMY;  Surgeon: Stark Klein, MD;  Location: [place];  Service: General;  Laterality: Left;  GEN AND PECTORAL BLOCK  . PORT-A-CATH REMOVAL N/A 08/31/2020   Procedure: REMOVAL PORT-A-CATH;  Surgeon: Stark Klein, MD;  Location: [place];  Service: General;  Laterality: N/A;  . PORTACATH PLACEMENT  Left 08/18/2019   Procedure: INSERTION PORT-A-CATH;  Surgeon: Stark Klein, MD;  Location: [place];  Service: General;  Laterality: Left;  . Rt knee arthoscopic    . TEE WITHOUT CARDIOVERSION N/A 02/21/2013   Procedure: TRANSESOPHAGEAL ECHOCARDIOGRAM (TEE);  Surgeon: Lelon Perla, MD;  Location: [facility] ENDOSCOPY;  Service: Cardiovascular;  Laterality: N/A;   Family History  Problem Relation Age of Onset  . Alcohol abuse Other   . Depression Other   . Hyperlipidemia Other   . Hypertension Other   . Kidney disease Other   . Colon cancer Mother        dx late 14s  . Cervical cancer Maternal Grandmother   . Stroke Paternal Grandmother   . Breast cancer Maternal Aunt        dx 82s  . Breast cancer Cousin        dx 53s   Social History   Socioeconomic History  . Marital status: Legally Separated    Spouse name: Not on file  .  Number of children: Not on file  . Years of education: Not on file  . Highest education level: Not on file  Occupational History    Comment: retired  Tobacco Use  . Smoking status: Former Smoker    Packs/day: 0.25    Types: Cigarettes    Quit date: 07/02/2019    Years since quitting: 1.6  . Smokeless tobacco: Never Used  Vaping Use  . Vaping Use: Never used  Substance and Sexual Activity  . Alcohol use: Yes    Comment: rare occasion  . Drug use: No  . Sexual activity: Not on file    Comment: Hysterectomy  Other Topics Concern  . 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    Divorced   Regular exercise- no   5 hours sleep    Lives with a son age 51    No pets         Social Determinants of Health   Financial Resource Strain: Medium Risk  . Difficulty of Paying Living Expenses: Somewhat hard  Food Insecurity: No Food Insecurity  . Worried About Charity fundraiser in the Last Year: Never true  . Ran Out of Food in the Last Year: Never true  Transportation Needs: No Transportation Needs  . Lack of  Transportation (Medical): No  . Lack of Transportation (Non-Medical): No  Physical Activity: Inactive  . Days of Exercise per Week: 0 days  . Minutes of Exercise per Session: 0 min  Stress: No Stress Concern Present  . Feeling of Stress : Not at all  Social Connections: Moderately Isolated  . Frequency of Communication with Friends and Family: Three times a week  . Frequency of Social Gatherings with Friends and Family: Once a week  . Attends Religious Services: 1 to 4 times per year  . Active Member of Clubs or Organizations: No  . Attends Archivist Meetings: Never  . Marital Status: Separated    Tobacco Counseling Counseling given: Not Answered   Clinical Intake:  Pre-visit preparation completed: Yes  Pain : No/denies pain     BMI - recorded: 34.98 Nutritional Status: BMI > 30  Obese Nutritional Risks: None Diabetes: No  How often do you need to have someone help you when you read instructions, pamphlets, or other written materials from your doctor or pharmacy?: 1 - Never  Diabetic?No  Interpreter Needed?: No  Information entered by :: Charlott Rakes, LPN   Activities of Daily Living In your present state of health, do you have any difficulty performing the following activities: 03/01/2021 08/31/2020  Hearing? N -  Vision? N -  Difficulty concentrating or making decisions? N -  Walking or climbing stairs? N -  Dressing or bathing? N -  Doing errands, shopping? N N  Preparing Food and eating ? N -  Using the Toilet? N -  In the past six months, have you accidently leaked urine? N -  Do you have problems with loss of bowel control? N -  Managing your Medications? N -  Managing your Finances? N -  Housekeeping or managing your Housekeeping? N -  Some recent data might be hidden    Patient Care Team: Panosh, Standley Brooking, MD as PCP - General Stanford Breed, Denice Bors, MD as PCP - Cardiology (Cardiology) Stanford Breed Denice Bors, MD (Cardiology) Susa Day, MD  (Orthopedic Surgery) Mauro Kaufmann, RN as Oncology Nurse Navigator Rockwell Germany, RN as Oncology Nurse  Navigator Nicholas Lose, MD as Consulting Physician (Hematology and Oncology)  Indicate any recent Medical Services you may have received from other than [facility] providers in the past year (date may be approximate).     Assessment:   This is a routine wellness examination for [name] Foster.  Hearing/Vision screen  Hearing Screening   125Hz  250Hz  500Hz  1000Hz  2000Hz  3000Hz  4000Hz  6000Hz  8000Hz   Right ear:           Left ear:           Comments: Pt denies any hearing issues  Vision Screening Comments: Pt follows up with [facility] for annual exams  Dietary issues and exercise activities discussed: Current Exercise Habits: The patient does not participate in regular exercise at present, Type of exercise: stretching (at times), Time (Minutes): 20, Frequency (Times/Week): 1, Weekly Exercise (Minutes/Week): 20  Goals Addressed            This Visit's Progress   . Patient Stated       None at this time       Depression Screen PHQ 2/9 Scores 03/01/2021 12/20/2020 11/05/2018 07/03/2017  PHQ - 2 Score 0 0 0 0    Fall Risk Fall Risk  03/01/2021 07/03/2017  Falls in the past year? 0 No  Number falls in past yr: 0 -  Injury with Fall? 0 -  Risk for fall due to : Impaired vision;Impaired balance/gait -  Follow up Falls prevention discussed -    FALL RISK PREVENTION PERTAINING TO THE HOME:  Any stairs in or around the home? Yes  If so, are there any without handrails? No  Home free of loose throw rugs in walkways, pet beds, electrical cords, etc? Yes  Adequate lighting in your home to reduce risk of falls? Yes   ASSISTIVE DEVICES UTILIZED TO PREVENT FALLS:  Life alert? No  Use of a cane, walker or w/c? No  Grab bars in the bathroom? No  Shower chair or bench in shower? No  Elevated toilet seat or a handicapped toilet? No   TIMED UP AND GO:  Was the test performed? No .       Cognitive Function:     6CIT Screen 03/01/2021  What Year? 0 points  What month? 0 points  What time? 0 points  Count back from 20 0 points  Months in reverse 0 points  Repeat phrase 0 points  Total Score 0    Immunizations Immunization History  Administered Date(s) Administered  . Fluad Quad(high Dose 65+) 07/03/2019, 07/08/2020  . Influenza Whole 07/20/2009  . Influenza, High Dose Seasonal PF 07/03/2017  . Influenza,inj,Quad PF,6+ Mos 09/12/2018  . Influenza-Unspecified 07/02/2013, 07/13/2014  . PFIZER Comirnaty(Gray Top)Covid-19 Tri-Sucrose Vaccine 02/01/2021  . PFIZER(Purple Top)SARS-COV-2 Vaccination 11/23/2019, 12/17/2019, 07/17/2020  . Pneumococcal Conjugate-13 07/03/2017  . Pneumococcal Polysaccharide-23 12/20/2020  . Pneumococcal-Unspecified 07/03/2011      Flu Vaccine status: Up to date  Pneumococcal vaccine status: Up to date  Covid-19 vaccine status: Completed vaccines  Qualifies for Shingles Vaccine? Yes   Zostavax completed No   Shingrix Completed?: No.    Education has been provided regarding the importance of this vaccine. Patient has been advised to call insurance company to determine out of pocket expense if they have not yet received this vaccine. Advised may also receive vaccine at local pharmacy or Health Dept. Verbalized acceptance and understanding.  Screening Tests Health Maintenance  Topic Date Due  . Zoster Vaccines- Shingrix (1 of 2) Never done  .  DEXA SCAN  Never done  . COLONOSCOPY (Pts 45-17yrs Insurance coverage will need to be confirmed)  06/25/2017  . TETANUS/TDAP  07/04/2027 (Originally 05/29/1970)  . INFLUENZA VACCINE  05/02/2021  . MAMMOGRAM  09/23/2022  . COVID-19 Vaccine  Completed  . Hepatitis C Screening  Completed  . PNA vac Low Risk Adult  Completed  . HPV VACCINES  Aged Out    Health Maintenance  Health Maintenance Due  Topic Date Due  . Zoster Vaccines- Shingrix (1 of 2) Never done  . DEXA SCAN  Never done   . COLONOSCOPY (Pts 45-49yrs Insurance coverage will need to be confirmed)  06/25/2017    Colorectal cancer screening: Referral to GI placed 03/01/21. Pt aware the office will call re: appt.  Mammogram status: Completed 09/23/20. Repeat every year  Bone density: pt may want to have this done during next mammogram Additional Screening:  Hepatitis C Screening:  Completed 01/26/16  Vision Screening: Recommended annual ophthalmology exams for early detection of glaucoma and other disorders of the eye. Is the patient up to date with their annual eye exam?  Yes  Who is the provider or what is the name of the office in which the patient attends annual eye exams? Dr Ronnald Ramp at [facility] If pt is not established with a provider, would they like to be referred to a provider to establish care? No .   Dental Screening: Recommended annual dental exams for proper oral hygiene  Community Resource Referral / Chronic Care Management: CRR required this visit?  No   CCM required this visit?  No      Plan:     I have personally reviewed and noted the following in the patient's chart:   . Medical and social history . Use of alcohol, tobacco or illicit drugs  . Current medications and supplements including opioid prescriptions. Patient is currently taking opioid prescriptions. Information provided to patient regarding non-opioid alternatives. Patient advised to discuss non-opioid treatment plan with their provider. . Functional ability and status . Nutritional status . Physical activity . Advanced directives . List of other physicians . Hospitalizations, surgeries, and ER visits in previous 12 months . Vitals . Screenings to include cognitive, depression, and falls . Referrals and appointments  In addition, I have reviewed and discussed with patient certain preventive protocols, quality metrics, and best practice recommendations. A written personalized care plan for preventive services as well  as general preventive health recommendations were provided to patient.     Willette Brace, LPN   [id number]   Nurse Notes: None

## 2021-03-01 NOTE — Patient Instructions (Addendum)
[name] Foster , Thank you for taking time to come for your Medicare Wellness Visit. I appreciate your ongoing commitment to your health goals. Please review the following plan we discussed and let me know if I can assist you in the future.   Screening recommendations/referrals: Colonoscopy: Order placed 03/01/21 Mammogram: 09/23/20 Bone Density: Pt may want this scheduled during next mammogram  Recommended yearly ophthalmology/optometry visit for glaucoma screening and checkup Recommended yearly dental visit for hygiene and checkup  Vaccinations: Influenza vaccine: Up to date Pneumococcal vaccine: Up to date Tdap vaccine: Not a candidate  Shingles vaccine: Shingrix discussed. Please contact your pharmacy for coverage information.    Covid-19:Completed 2/21, 3/17, 07/17/20 & 02/01/21  Advanced directives: Please bring a copy of your health care power of attorney and living will to the office at your convenience.  Conditions/risks identified: None at this time  Next appointment: Follow up in one year for your annual wellness visit    Preventive Care 65 Years and Older, Female Preventive care refers to lifestyle choices and visits with your health care provider that can promote health and wellness. What does preventive care include?  A yearly physical exam. This is also called an annual well check.  Dental exams once or twice a year.  Routine eye exams. Ask your health care provider how often you should have your eyes checked.  Personal lifestyle choices, including:  Daily care of your teeth and gums.  Regular physical activity.  Eating a healthy diet.  Avoiding tobacco and drug use.  Limiting alcohol use.  Practicing safe sex.  Taking low-dose aspirin every day.  Taking vitamin and mineral supplements as recommended by your health care provider. What happens during an annual well check? The services and screenings done by your health care provider during your annual  well check will depend on your age, overall health, lifestyle risk factors, and family history of disease. Counseling  Your health care provider may ask you questions about your:  Alcohol use.  Tobacco use.  Drug use.  Emotional well-being.  Home and relationship well-being.  Sexual activity.  Eating habits.  History of falls.  Memory and ability to understand (cognition).  Work and work Statistician.  Reproductive health. Screening  You may have the following tests or measurements:  Height, weight, and BMI.  Blood pressure.  Lipid and cholesterol levels. These may be checked every 5 years, or more frequently if you are over 75 years old.  Skin check.  Lung cancer screening. You may have this screening every year starting at age 4 if you have a 30-pack-year history of smoking and currently smoke or have quit within the past 15 years.  Fecal occult blood test (FOBT) of the stool. You may have this test every year starting at age 76.  Flexible sigmoidoscopy or colonoscopy. You may have a sigmoidoscopy every 5 years or a colonoscopy every 10 years starting at age 21.  Hepatitis C blood test.  Hepatitis B blood test.  Sexually transmitted disease (STD) testing.  Diabetes screening. This is done by checking your blood sugar (glucose) after you have not eaten for a while (fasting). You may have this done every 1-3 years.  Bone density scan. This is done to screen for osteoporosis. You may have this done starting at age 60.  Mammogram. This may be done every 1-2 years. Talk to your health care provider about how often you should have regular mammograms. Talk with your health care provider about your test results, treatment  options, and if necessary, the need for more tests. Vaccines  Your health care provider may recommend certain vaccines, such as:  Influenza vaccine. This is recommended every year.  Tetanus, diphtheria, and acellular pertussis (Tdap, Td) vaccine.  You may need a Td booster every 10 years.  Zoster vaccine. You may need this after age 57.  Pneumococcal 13-valent conjugate (PCV13) vaccine. One dose is recommended after age 17.  Pneumococcal polysaccharide (PPSV23) vaccine. One dose is recommended after age 47. Talk to your health care provider about which screenings and vaccines you need and how often you need them. This information is not intended to replace advice given to you by your health care provider. Make sure you discuss any questions you have with your health care provider. Document Released: 10/15/2015 Document Revised: 06/07/2016 Document Reviewed: 07/20/2015 Elsevier Interactive Patient Education  2017 [place] Prevention in the Home Falls can cause injuries. They can happen to people of all ages. There are many things you can do to make your home safe and to help prevent falls. What can I do on the outside of my home?  Regularly fix the edges of walkways and driveways and fix any cracks.  Remove anything that might make you trip as you walk through a door, such as a raised step or threshold.  Trim any bushes or trees on the path to your home.  Use bright outdoor lighting.  Clear any walking paths of anything that might make someone trip, such as rocks or tools.  Regularly check to see if handrails are loose or broken. Make sure that both sides of any steps have handrails.  Any raised decks and porches should have guardrails on the edges.  Have any leaves, snow, or ice cleared regularly.  Use sand or salt on walking paths during winter.  Clean up any spills in your garage right away. This includes oil or grease spills. What can I do in the bathroom?  Use night lights.  Install grab bars by the toilet and in the tub and shower. Do not use towel bars as grab bars.  Use non-skid mats or decals in the tub or shower.  If you need to sit down in the shower, use a plastic, non-slip stool.  Keep the  floor dry. Clean up any water that spills on the floor as soon as it happens.  Remove soap buildup in the tub or shower regularly.  Attach bath mats securely with double-sided non-slip rug tape.  Do not have throw rugs and other things on the floor that can make you trip. What can I do in the bedroom?  Use night lights.  Make sure that you have a light by your bed that is easy to reach.  Do not use any sheets or blankets that are too big for your bed. They should not hang down onto the floor.  Have a firm chair that has side arms. You can use this for support while you get dressed.  Do not have throw rugs and other things on the floor that can make you trip. What can I do in the kitchen?  Clean up any spills right away.  Avoid walking on wet floors.  Keep items that you use a lot in easy-to-reach places.  If you need to reach something above you, use a strong step stool that has a grab bar.  Keep electrical cords out of the way.  Do not use floor polish or wax that makes floors slippery.  If you must use wax, use non-skid floor wax.  Do not have throw rugs and other things on the floor that can make you trip. What can I do with my stairs?  Do not leave any items on the stairs.  Make sure that there are handrails on both sides of the stairs and use them. Fix handrails that are broken or loose. Make sure that handrails are as long as the stairways.  Check any carpeting to make sure that it is firmly attached to the stairs. Fix any carpet that is loose or worn.  Avoid having throw rugs at the top or bottom of the stairs. If you do have throw rugs, attach them to the floor with carpet tape.  Make sure that you have a light switch at the top of the stairs and the bottom of the stairs. If you do not have them, ask someone to add them for you. What else can I do to help prevent falls?  Wear shoes that:  Do not have high heels.  Have rubber bottoms.  Are comfortable and fit  you well.  Are closed at the toe. Do not wear sandals.  If you use a stepladder:  Make sure that it is fully opened. Do not climb a closed stepladder.  Make sure that both sides of the stepladder are locked into place.  Ask someone to hold it for you, if possible.  Clearly mark and make sure that you can see:  Any grab bars or handrails.  First and last steps.  Where the edge of each step is.  Use tools that help you move around (mobility aids) if they are needed. These include:  Canes.  Walkers.  Scooters.  Crutches.  Turn on the lights when you go into a dark area. Replace any light bulbs as soon as they burn out.  Set up your furniture so you have a clear path. Avoid moving your furniture around.  If any of your floors are uneven, fix them.  If there are any pets around you, be aware of where they are.  Review your medicines with your doctor. Some medicines can make you feel dizzy. This can increase your chance of falling. Ask your doctor what other things that you can do to help prevent falls. This information is not intended to replace advice given to you by your health care provider. Make sure you discuss any questions you have with your health care provider. Document Released: 07/15/2009 Document Revised: 02/24/2016 Document Reviewed: 10/23/2014 Elsevier Interactive Patient Education  2017 [employer].

## 2021-03-09 ENCOUNTER — Encounter: Payer: Self-pay | Admitting: Gastroenterology

## 2021-03-15 DIAGNOSIS — I972 Postmastectomy lymphedema syndrome: Secondary | ICD-10-CM | POA: Diagnosis not present

## 2021-03-16 ENCOUNTER — Ambulatory Visit (INDEPENDENT_AMBULATORY_CARE_PROVIDER_SITE_OTHER): Payer: Medicare Other | Admitting: Podiatry

## 2021-03-16 ENCOUNTER — Other Ambulatory Visit: Payer: Self-pay

## 2021-03-16 DIAGNOSIS — M79675 Pain in left toe(s): Secondary | ICD-10-CM

## 2021-03-16 DIAGNOSIS — B351 Tinea unguium: Secondary | ICD-10-CM | POA: Diagnosis not present

## 2021-03-16 DIAGNOSIS — M79674 Pain in right toe(s): Secondary | ICD-10-CM | POA: Diagnosis not present

## 2021-03-17 ENCOUNTER — Ambulatory Visit (INDEPENDENT_AMBULATORY_CARE_PROVIDER_SITE_OTHER): Payer: Medicare Other | Admitting: General Practice

## 2021-03-17 DIAGNOSIS — Z7901 Long term (current) use of anticoagulants: Secondary | ICD-10-CM

## 2021-03-17 LAB — POCT INR: INR: 1.9 — AB (ref 2.0–3.0)

## 2021-03-17 NOTE — Patient Instructions (Addendum)
Pre visit review using our clinic review tool, if applicable. No additional management support is needed unless otherwise documented below in the visit note.  Take 1 1/2 tablets today and then continue to take 1 (5 mg) tablet daily.  Re-check in 6 weeks.

## 2021-03-17 NOTE — Progress Notes (Signed)
Medical screening examination/treatment/procedure(s) were performed by non-physician practitioner and as supervising physician I was immediately available for consultation/collaboration. I agree with above. Aeriana Speece, MD   

## 2021-03-23 ENCOUNTER — Encounter: Payer: Self-pay | Admitting: Podiatry

## 2021-03-23 NOTE — Progress Notes (Signed)
  Subjective:  Patient ID: [name] Foster, female    DOB: 04-27-1951,  MRN: [id number]  70 y.o. female presents at risk foot care with h/o clotting disorder and painful thick toenails that are difficult to trim. Pain interferes with ambulation. Aggravating factors include wearing enclosed shoe gear. Pain is relieved with periodic professional debridement.  She has h/o bilateral great toe paronychias of proximal nailfold. She states they have resolved.  Patient's PCP is Dr. Shanon Ace and last visit was 12/20/2020.  Allergies  Allergen Reactions   Penicillins Rash    Did it involve swelling of the face/tongue/throat, SOB, or low BP? Unknown Did it involve sudden or severe rash/hives, skin peeling, or any reaction on the inside of your mouth or nose? Yes Did you need to seek medical attention at a hospital or doctor's office? Yes When did it last happen?      in her 69s If all above answers are "NO", may proceed with cephalosporin use.    Tetanus Toxoid Rash    Caused cellulitis    Aspirin Rash    Review of Systems: Negative except as noted in the HPI.   Objective:   Constitutional Pt is a pleasant 70 y.o. African American female obese in NAD. AAO x 3.   Vascular Capillary fill time to digits <3 seconds b/l lower extremities. Palpable DP pulse(s) b/l lower extremities Palpable PT pulse(s) b/l lower extremities Pedal hair sparse. Lower extremity skin temperature gradient within normal limits. No pain with calf compression b/l.  Neurologic Protective sensation diminished with 10g monofilament b/l.  Dermatologic Toenails 1-5 b/l elongated, discolored, dystrophic, thickened, crumbly with subungual debris and tenderness to dorsal palpation. Proximal nailfold paronychias have resolved b/l great toes. Both are completely epithelialized.  Orthopedic: Normal muscle strength 5/5 to all lower extremity muscle groups bilaterally. No pain crepitus or joint limitation noted with ROM b/l. Pes  planus deformity noted b/l.    Radiographs: None Assessment:   1. Pain due to onychomycosis of toenails of both feet     Plan:  Patient was evaluated and treated and all questions answered.  Onychomycosis with pain -Nails palliatively debridement as below. -Educated on self-care  Procedure: Nail Debridement Rationale: Pain Type of Debridement: manual, sharp debridement. Instrumentation: Nail nipper, rotary burr. Number of Nails: 10  -Examined patient. -Patient to continue soft, supportive shoe gear daily. -Toenails 1-5 b/l were debrided in length and girth with sterile nail nippers and dremel without iatrogenic bleeding.  -Patient to report any pedal injuries to medical professional immediately. -Patient/POA to call should there be question/concern in the interim.  Return in about 3 months (around 06/16/2021).  Marzetta Board, DPM

## 2021-03-25 DIAGNOSIS — C50912 Malignant neoplasm of unspecified site of left female breast: Secondary | ICD-10-CM | POA: Diagnosis not present

## 2021-03-30 DIAGNOSIS — C50912 Malignant neoplasm of unspecified site of left female breast: Secondary | ICD-10-CM | POA: Diagnosis not present

## 2021-04-05 DIAGNOSIS — C50912 Malignant neoplasm of unspecified site of left female breast: Secondary | ICD-10-CM | POA: Diagnosis not present

## 2021-04-13 DIAGNOSIS — C50912 Malignant neoplasm of unspecified site of left female breast: Secondary | ICD-10-CM | POA: Diagnosis not present

## 2021-04-21 ENCOUNTER — Ambulatory Visit (INDEPENDENT_AMBULATORY_CARE_PROVIDER_SITE_OTHER): Payer: Medicare Other | Admitting: Gastroenterology

## 2021-04-21 ENCOUNTER — Telehealth: Payer: Self-pay

## 2021-04-21 ENCOUNTER — Encounter: Payer: Self-pay | Admitting: Gastroenterology

## 2021-04-21 VITALS — BP 128/66 | HR 57 | Ht 66.0 in | Wt 224.0 lb

## 2021-04-21 DIAGNOSIS — Z7901 Long term (current) use of anticoagulants: Secondary | ICD-10-CM | POA: Diagnosis not present

## 2021-04-21 DIAGNOSIS — Z01818 Encounter for other preprocedural examination: Secondary | ICD-10-CM | POA: Diagnosis not present

## 2021-04-21 DIAGNOSIS — Z800 Family history of malignant neoplasm of digestive organs: Secondary | ICD-10-CM

## 2021-04-21 DIAGNOSIS — Z1211 Encounter for screening for malignant neoplasm of colon: Secondary | ICD-10-CM

## 2021-04-21 NOTE — Progress Notes (Signed)
Referring Provider: Burnis Medin, MD Primary Care Physician:  Burnis Medin, MD  Reason for Consultation:  Family history of colon cancer   IMPRESSION:  Family history of colon cancer (mother in her 34s) Last colonoscopy 2005 with hyperplastic polyp Chronic warfarin use BMI 36  Colonoscopy recommended. Will need to hold warfarin before endoscopy.  I discussed with the patient that there is a low, but real, risk of a cardiovascular event such as heart attack, stroke, or embolism/thrombosis while off warfarin. Will communicate by EMR with patient's prescribing provider to confirm that holding the warfarin is appropriate at this time and to determine if a anticoagulation bridge is indicated.    PLAN: Colonoscopy after warfarin washout with INR <1.6  Please see the "Patient Instructions" section for addition details about the plan.  HPI: [name] Foster is a 70 y.o. female referred by Dr. Regis Bill for a family history of colon cancer and colon cancer screening.  The history is obtained through the patient and review of her electronic health record.  She is on chronic warfarin. Echo 05/19/20 showed EF of 50-55%.   Prior colonoscopy with Dr. Cristina Gong in 2005 revealed a hyperplastic polyp (path results but not procedure note are available in EPIC).  She knows she is overdue for surveillance.  GI ROS is negative.  MRI of the abdomen with and without contrast 11/25/2019 showed a small renal neoplasm and kidney cysts  Labs 12/20/20: normal CBC   Mother with colon cancer in her 50s. No other known family history of colon cancer or polyps. No family history of uterine/endometrial cancer, pancreatic cancer or gastric/stomach cancer. Maternal grandmother with cervical cancer. Maternal first cousin with esophageal cancer in her late 4s.    Past Medical History:  Diagnosis Date   Allergic rhinitis    Anemia    Aortic valve disorders    Arthritis    Asthma    Cancer ([place])    Breast  Cancer   CHF (congestive heart failure) ([place])    Coronary artery disease    Family history of adverse reaction to anesthesia    difficulty waking mother up after surgery   Family history of breast cancer    Family history of cervical cancer    Family history of colon cancer    Family history of throat cancer    Heart murmur    Hyperlipidemia    Hypertension    Long term (current) use of anticoagulants    Other primary cardiomyopathies    Peripheral vascular disease ([place])    right leg   Personal history of colonic polyps    Personal history of venous thrombosis and embolism     Past Surgical History:  Procedure Laterality Date   ABDOMINAL HYSTERECTOMY     BREAST BIOPSY Left 07/2019   CARDIAC CATHETERIZATION  2004   DILATION AND CURETTAGE OF UTERUS     several   MASTECTOMY MODIFIED RADICAL Left 03/09/2020   Left Modified Radical Mastectomy (left mastectomy with axillary lymph node dissection)    MASTECTOMY MODIFIED RADICAL Left 03/09/2020   Procedure: LEFT MODIFIED RADICAL MASTECTOMY;  Surgeon: Stark Klein, MD;  Location: [facility];  Service: General;  Laterality: Left;  GEN AND PECTORAL BLOCK   PORT-A-CATH REMOVAL N/A 08/31/2020   Procedure: REMOVAL PORT-A-CATH;  Surgeon: Stark Klein, MD;  Location: [facility];  Service: General;  Laterality: N/A;   PORTACATH PLACEMENT Left 08/18/2019   Procedure: INSERTION PORT-A-CATH;  Surgeon: Stark Klein, MD;  Location: [facility];  Service:  General;  Laterality: Left;   Rt knee arthoscopic     SHOULDER ARTHROSCOPY W/ ROTATOR CUFF REPAIR Right 07/2016   TEE WITHOUT CARDIOVERSION N/A 02/21/2013   Procedure: TRANSESOPHAGEAL ECHOCARDIOGRAM (TEE);  Surgeon: Lelon Perla, MD;  Location: [facility] ENDOSCOPY;  Service: Cardiovascular;  Laterality: N/A;    Current Outpatient Medications  Medication Sig Dispense Refill   Albuterol Sulfate (PROAIR RESPICLICK) 086 (90 Base) MCG/ACT AEPB Inhale 2 puffs into the lungs every 6 (six) hours as needed. (Patient  taking differently: Inhale 2 puffs into the lungs every 6 (six) hours as needed (SOB / wheezing).) 2 each 1   amLODipine (NORVASC) 10 MG tablet Take 1 tablet (10 mg total) by mouth daily. 90 tablet 3   atorvastatin (LIPITOR) 20 MG tablet TAKE 1 TABLET BY MOUTH  DAILY (Patient taking differently: Take 20 mg by mouth at bedtime.) 90 tablet 3   Carboxymethylcellul-Glycerin (CLEAR EYES FOR DRY EYES) 1-0.25 % SOLN Place 1 drop into both eyes 3 (three) times daily as needed (dry eyes).      Cholecalciferol (VITAMIN D) 50 MCG (2000 UT) tablet Take 2,000 Units by mouth daily.     fexofenadine (ALLEGRA) 180 MG tablet Take 180 mg by mouth daily as needed for allergies.      fluticasone (FLONASE) 50 MCG/ACT nasal spray USE 2 SPRAYS IN EACH NOSTRIL DAILY. PT NEEDS TO SCHEDULE A FOLLOW UP APPT BEFORE NEXT REFILL. (Patient taking differently: Place 2 sprays into both nostrils daily.) 16 g 0   hydrALAZINE (APRESOLINE) 50 MG tablet Take 0.5 tablets (25 mg total) by mouth 3 (three) times daily. (Patient taking differently: Take 50 mg by mouth 3 (three) times daily.) 135 tablet 3   methocarbamol (ROBAXIN) 500 MG tablet Take 1 tablet (500 mg total) by mouth every 6 (six) hours as needed for muscle spasms. 20 tablet 1   metoprolol tartrate (LOPRESSOR) 50 MG tablet TAKE 1 TABLET BY MOUTH  TWICE DAILY (Patient taking differently: Take 50 mg by mouth in the morning and at bedtime.) 180 tablet 3   sodium chloride (OCEAN) 0.65 % SOLN nasal spray Place 1 spray into both nostrils as needed for congestion.     traMADol (ULTRAM) 50 MG tablet Take 1 tablet (50 mg total) by mouth every 6 (six) hours as needed (mild pain). 20 tablet 0   warfarin (COUMADIN) 5 MG tablet TAKE 1 TABLET BY MOUTH  DAILY OR AS DIRECTED BY  ANTICOAGULATION CLINIC (Patient taking differently: Take 5 mg by mouth at bedtime. TAKE 1 TABLET BY MOUTH  DAILY OR AS DIRECTED BY  ANTICOAGULATION CLINIC) 100 tablet 3   furosemide (LASIX) 40 MG tablet TAKE 1 TABLET BY  MOUTH  DAILY (Patient not taking: No sig reported) 90 tablet 3   potassium chloride SA (KLOR-CON) 20 MEQ tablet Take 20 mEq by mouth daily. (Patient not taking: No sig reported)     No current facility-administered medications for this visit.    Allergies as of 04/21/2021 - Review Complete 04/21/2021  Allergen Reaction Noted   Penicillins Rash 06/26/2007   Tetanus toxoid Rash 06/26/2007   Aspirin Rash 06/26/2007    Family History  Problem Relation Age of Onset   Alcohol abuse Other    Depression Other    Hyperlipidemia Other    Hypertension Other    Kidney disease Other    Colon cancer Mother        dx late 56s   Cervical cancer Maternal Grandmother    Stroke Paternal Grandmother  Breast cancer Maternal Aunt        dx 78s   Breast cancer Cousin        dx 38s     Review of Systems: 12 system ROS is negative except as noted above with the additions of anxiety, arthritis, back pain, fatigue, shortness of breath.   Physical Exam: General:   Alert,  well-nourished, pleasant and cooperative in NAD Head:  Normocephalic and atraumatic. Eyes:  Sclera clear, no icterus.   Conjunctiva pink. Ears:  Normal auditory acuity. Nose:  No deformity, discharge,  or lesions. Mouth:  No deformity or lesions.   Neck:  Supple; no masses or thyromegaly. Lungs:  Clear throughout to auscultation.   No wheezes. Heart:  Regular rate and rhythm; no murmurs. Abdomen:  Soft,nontender, nondistended, normal bowel sounds, no rebound or guarding. No hepatosplenomegaly.   Rectal:  Deferred  Msk:  Symmetrical. No boney deformities LAD: No inguinal or umbilical LAD Extremities:  No clubbing or edema. Neurologic:  Alert and  oriented x4;  grossly nonfocal Skin:  Intact without significant lesions or rashes. Psych:  Alert and cooperative. Normal mood and affect.     [name] Foster L. Tarri Glenn, MD, MPH 04/21/2021, 10:16 AM

## 2021-04-21 NOTE — Telephone Encounter (Signed)
[facility] Pre-operative Risk Assessment     [name] Foster 02-Mar-1951 [id number]  Procedure: Colonoscopy Anesthesia type:  MAC Procedure Date: 05/10/21 Provider: Dr. Tarri Glenn  Type of Clearance needed: Pharmacy  Medication(s) needing held: Coumadin   Length of time for medication to be held: 5 days  Please review request and advise by either responding to this message or by sending your response to the fax # provided below.  Thank you,  [facility]  Phone: [phone] Fax: [phone] ATTENTION: Phylicia Mcgaugh, LPN

## 2021-04-21 NOTE — Telephone Encounter (Signed)
Called pt and advised about Dr. Jacalyn Lefevre response below. Verbalized acceptance and understanding.

## 2021-04-21 NOTE — Patient Instructions (Addendum)
It was my pleasure to provide care to you today. Based on our discussion, I am providing you with my recommendations below:  RECOMMENDATION(S):   You have been scheduled for a colonoscopy. Please follow written instructions given to you at your visit today.   PREP:   Please pick up your prep supplies at the pharmacy within the next 1-3 days.  INHALERS:   If you use inhalers (even only as needed), please bring them with you on the day of your procedure.  MEDICATIONS TO HOLD:  We will contact your provider to request permission for you to hold COUMADIN. Once we receive a response, you will be contacted by our office. If you do not hear from our office 1 week prior to your scheduled procedure, please contact our office at [phone].   COLONOSCOPY TIPS:  To reduce nausea and dehydration, stay well hydrated for 3-4 days prior to the exam.  To prevent skin/hemorrhoid irritation - prior to wiping, put A&Dointment or vaseline on the toilet paper. Keep a towel or pad on the bed.  BEFORE STARTING YOUR PREP, drink  64oz of clear liquids in the morning. This will help to flush the colon and will ensure you are well hydrated!!!!  NOTE - This is in addition to the fluids required for to complete your prep. Use of a flavored hard candy, such as grape Anise Salvo, can counteract some of the flavor of the prep and may prevent some nausea.   FOLLOW UP:  After your procedure, you will receive a call from my office staff regarding my recommendation for follow up.  BMI:  If you are age 82 or older, your body mass index should be between 23-30. Your Body mass index is 36.15 kg/m. If this is out of the aforementioned range listed, please consider follow up with your Primary Care Provider.  MY CHART:  The [facility] providers would like to encourage you to use [facility] to communicate with providers for non-urgent requests or questions.  Due to long hold times on the telephone, sending your  provider a message by [facility] may be a faster and more efficient way to get a response.  Please allow 48 business hours for a response.  Please remember that this is for non-urgent requests.   Thank you for trusting me with your gastrointestinal care!    Thornton Park, MD, MPH

## 2021-04-25 NOTE — Telephone Encounter (Signed)
   Primary Cardiologist: Kirk Ruths, MD  Chart reviewed as part of pre-operative protocol coverage. Given past medical history and time since last visit, based on ACC/AHA guidelines, Toshie Moody-Haskins would be at acceptable risk for the planned procedure without further cardiovascular testing.   Per Dr. Stanford Breed: Patient may hold Coumadin 5 days prior to procedure and resume after procedure is completed on the day of procedure.  I will route this recommendation to the requesting party via Epic fax function and remove from pre-op pool.  Please call with questions.  Jossie Ng. Jamiah Homeyer NP-C    04/25/2021, 10:51 AM [address] Office [phone] Fax [phone]

## 2021-04-28 ENCOUNTER — Ambulatory Visit (INDEPENDENT_AMBULATORY_CARE_PROVIDER_SITE_OTHER): Payer: Medicare Other | Admitting: General Practice

## 2021-04-28 ENCOUNTER — Other Ambulatory Visit: Payer: Self-pay

## 2021-04-28 DIAGNOSIS — Z7901 Long term (current) use of anticoagulants: Secondary | ICD-10-CM

## 2021-04-28 LAB — POCT INR: INR: 2.3 (ref 2.0–3.0)

## 2021-04-28 NOTE — Patient Instructions (Addendum)
Pre visit review using our clinic review tool, if applicable. No additional management support is needed unless otherwise documented below in the visit note.  Continue to take 1 (5 mg) tablet daily.   Please follow patient instructions for colonoscopy on 8/9.  Re-check on 8/16.  8/4 - Take last dose of coumadin until after procedure.  8/10 through 8/13 take 1 1/2 tablets  8/14 resume 1 tablet daily  8/16 - Re-check INR

## 2021-04-28 NOTE — Progress Notes (Signed)
Medical screening examination/treatment/procedure(s) were performed by non-physician practitioner and as supervising physician I was immediately available for consultation/collaboration. I agree with above. Juanice Warburton, MD   

## 2021-05-06 ENCOUNTER — Ambulatory Visit (HOSPITAL_COMMUNITY): Payer: Medicare Other | Attending: Cardiovascular Disease

## 2021-05-06 ENCOUNTER — Other Ambulatory Visit: Payer: Self-pay

## 2021-05-06 DIAGNOSIS — I351 Nonrheumatic aortic (valve) insufficiency: Secondary | ICD-10-CM | POA: Diagnosis not present

## 2021-05-06 LAB — ECHOCARDIOGRAM COMPLETE
AR max vel: 1.42 cm2
AV Area VTI: 1.63 cm2
AV Area mean vel: 1.68 cm2
AV Mean grad: 9 mmHg
AV Peak grad: 18 mmHg
Ao pk vel: 2.12 m/s
Area-P 1/2: 2.55 cm2
P 1/2 time: 553 msec
S' Lateral: 3.8 cm

## 2021-05-10 ENCOUNTER — Other Ambulatory Visit: Payer: Self-pay

## 2021-05-10 ENCOUNTER — Ambulatory Visit (AMBULATORY_SURGERY_CENTER): Payer: Medicare Other | Admitting: Gastroenterology

## 2021-05-10 ENCOUNTER — Encounter: Payer: Self-pay | Admitting: Gastroenterology

## 2021-05-10 VITALS — BP 132/58 | HR 63 | Temp 97.0°F | Resp 16 | Ht 66.0 in | Wt 224.0 lb

## 2021-05-10 DIAGNOSIS — D126 Benign neoplasm of colon, unspecified: Secondary | ICD-10-CM | POA: Diagnosis not present

## 2021-05-10 DIAGNOSIS — Z86010 Personal history of colonic polyps: Secondary | ICD-10-CM | POA: Diagnosis not present

## 2021-05-10 DIAGNOSIS — D124 Benign neoplasm of descending colon: Secondary | ICD-10-CM | POA: Diagnosis not present

## 2021-05-10 DIAGNOSIS — D123 Benign neoplasm of transverse colon: Secondary | ICD-10-CM | POA: Diagnosis not present

## 2021-05-10 DIAGNOSIS — Z7901 Long term (current) use of anticoagulants: Secondary | ICD-10-CM

## 2021-05-10 DIAGNOSIS — Z800 Family history of malignant neoplasm of digestive organs: Secondary | ICD-10-CM | POA: Diagnosis not present

## 2021-05-10 DIAGNOSIS — D122 Benign neoplasm of ascending colon: Secondary | ICD-10-CM

## 2021-05-10 MED ORDER — SODIUM CHLORIDE 0.9 % IV SOLN: 500.0000 mL | Freq: Once | INTRAVENOUS | Status: DC

## 2021-05-10 NOTE — Progress Notes (Signed)
Called to room to assist during endoscopic procedure.  Patient ID and intended procedure confirmed with present staff. Received instructions for my participation in the procedure from the performing physician.  

## 2021-05-10 NOTE — Progress Notes (Signed)
pt tolerated well. VSS. awake and to recovery. Report given to RN.  

## 2021-05-10 NOTE — Progress Notes (Signed)
Medical history reviewed with no changes noted. VS assessed by C.W 

## 2021-05-10 NOTE — Op Note (Signed)
[place] Patient Name: [name] Foster Procedure Date: 05/10/2021 7:41 AM MRN: [id number] Endoscopist: Thornton Park MD, MD Age: 70 Referring MD:  Date of Birth: 11/25/1950 Gender: Female Account #: [id number] Procedure:                Colonoscopy Indications:              Screening in patient at increased risk: Family                            history of 1st-degree relative with colorectal                            cancer                           Family history of colon cancer (mother in her 3s)                           Last colonoscopy 2005 with hyperplastic polyp Medicines:                Monitored Anesthesia Care Procedure:                Pre-Anesthesia Assessment:                           - Prior to the procedure, a History and Physical                            was performed, and patient medications and                            allergies were reviewed. The patient's tolerance of                            previous anesthesia was also reviewed. The risks                            and benefits of the procedure and the sedation                            options and risks were discussed with the patient.                            All questions were answered, and informed consent                            was obtained. Prior Anticoagulants: The patient has                            taken Coumadin (warfarin), last dose was 5 days                            prior to procedure. ASA Grade Assessment: III - A  patient with severe systemic disease. After                            reviewing the risks and benefits, the patient was                            deemed in satisfactory condition to undergo the                            procedure.                           After obtaining informed consent, the colonoscope                            was passed under direct vision. Throughout the                            procedure, the  patient's blood pressure, pulse, and                            oxygen saturations were monitored continuously. The                            CF [id number] #[id number] was introduced through the anus                            and advanced to the the cecum, identified by                            appendiceal orifice and ileocecal valve. A second                            forward view of the right colon was performed. The                            colonoscopy was technically difficult and complex                            due to a redundant colon, significant looping and a                            tortuous colon. Successful completion of the                            procedure was aided by changing the patient's                            position, withdrawing and reinserting the scope and                            applying abdominal pressure. The patient tolerated  the procedure well. The quality of the bowel                            preparation was good although 744mL of green liquid                            was removed during the procedure. The ileocecal                            valve, appendiceal orifice, and rectum were                            photographed. Scope In: 7:46:55 AM Scope Out: 8:13:43 AM Scope Withdrawal Time: 0 hours 18 minutes 41 seconds  Total Procedure Duration: 0 hours 26 minutes 48 seconds  Findings:                 The perianal and digital rectal examinations were                            normal.                           Non-bleeding internal hemorrhoids were found.                           A few small-mouthed diverticula were found in the                            sigmoid colon.                           Two sessile polyps were found in the descending                            colon. The polyps were 2 to 4 mm in size. These                            polyps were removed with a cold snare. Resection                             and retrieval were complete. Estimated blood loss                            was minimal.                           A 3 mm polyp was found in the transverse colon. The                            polyp was sessile. The polyp was removed with a                            cold snare. Resection and retrieval were complete.  Estimated blood loss was minimal.                           Three sessile polyps were found in the hepatic                            flexure. The polyps were 3 to 6 mm in size. These                            polyps were removed with a cold snare. Resection                            and retrieval were complete. Estimated blood loss                            was minimal.                           Two sessile polyps were found in the ascending                            colon. The polyps were 3 to 5 mm in size. These                            polyps were removed with a cold snare. Resection                            and retrieval were complete. Estimated blood loss                            was minimal.                           The colon is tortuous and redundant. The exam was                            otherwise without abnormality on direct and                            retroflexion views. Complications:            No immediate complications. Estimated blood loss:                            Minimal. Estimated Blood Loss:     Estimated blood loss was minimal. Impression:               - Non-bleeding internal hemorrhoids.                           - Diverticulosis in the sigmoid colon.                           - Two 2 to 4 mm polyps in the descending colon,  removed with a cold snare. Resected and retrieved.                           - One 3 mm polyp in the transverse colon, removed                            with a cold snare. Resected and retrieved.                           - Three 3 to 6 mm polyps at the  hepatic flexure,                            removed with a cold snare. Resected and retrieved.                           - Two 3 to 5 mm polyps in the ascending colon,                            removed with a cold snare. Resected and retrieved. Recommendation:           - Patient has a contact number available for                            emergencies. The signs and symptoms of potential                            delayed complications were discussed with the                            patient. Return to normal activities tomorrow.                            Written discharge instructions were provided to the                            patient.                           - High fiber diet.                           - Continue present medications.                           - Resume Coumadin (warfarin) at prior dose in 2                            days.                           - Await pathology results.                           - Repeat colonoscopy in 3 years for surveillance.  Consider a different prep at that time.                           - Emerging evidence supports eating a diet of                            fruits, vegetables, grains, calcium, and yogurt                            while reducing red meat and alcohol may reduce the                            risk of colon cancer.                           - Thank you for allowing me to be involved in your                            colon cancer prevention. Thornton Park MD, MD 05/10/2021 8:18:20 AM This report has been signed electronically.

## 2021-05-10 NOTE — Patient Instructions (Signed)
Handouts given for polyps, diverticulosis, hemorrhoids and high fiber diet.  Resume your Coumadin in 2 days (Thursday).  Await pathology results.  YOU HAD AN ENDOSCOPIC PROCEDURE TODAY AT [facility]:   Refer to the procedure report that was given to you for any specific questions about what was found during the examination.  If the procedure report does not answer your questions, please call your gastroenterologist to clarify.  If you requested that your care partner not be given the details of your procedure findings, then the procedure report has been included in a sealed envelope for you to review at your convenience later.  YOU SHOULD EXPECT: Some feelings of bloating in the abdomen. Passage of more gas than usual.  Walking can help get rid of the air that was put into your GI tract during the procedure and reduce the bloating. If you had a lower endoscopy (such as a colonoscopy or flexible sigmoidoscopy) you may notice spotting of blood in your stool or on the toilet paper. If you underwent a bowel prep for your procedure, you may not have a normal bowel movement for a few days.  Please Note:  You might notice some irritation and congestion in your nose or some drainage.  This is from the oxygen used during your procedure.  There is no need for concern and it should clear up in a day or so.  SYMPTOMS TO REPORT IMMEDIATELY:  Following lower endoscopy (colonoscopy or flexible sigmoidoscopy):  Excessive amounts of blood in the stool  Significant tenderness or worsening of abdominal pains  Swelling of the abdomen that is new, acute  Fever of 100F or higher  For urgent or emergent issues, a gastroenterologist can be reached at any hour by calling [phone]. Do not use MyChart messaging for urgent concerns.    DIET:  We do recommend a small meal at first, but then you may proceed to your regular diet.  Drink plenty of fluids but you should avoid alcoholic beverages for  24 hours.  ACTIVITY:  You should plan to take it easy for the rest of today and you should NOT DRIVE or use heavy machinery until tomorrow (because of the sedation medicines used during the test).    FOLLOW UP: Our staff will call the number listed on your records 48-72 hours following your procedure to check on you and address any questions or concerns that you may have regarding the information given to you following your procedure. If we do not reach you, we will leave a message.  We will attempt to reach you two times.  During this call, we will ask if you have developed any symptoms of COVID 19. If you develop any symptoms (ie: fever, flu-like symptoms, shortness of breath, cough etc.) before then, please call [phone].  If you test positive for Covid 19 in the 2 weeks post procedure, please call and report this information to [place].    If any biopsies were taken you will be contacted by phone or by letter within the next 1-3 weeks.  Please call us at [phone] if you have not heard about the biopsies in 3 weeks.    SIGNATURES/CONFIDENTIALITY: You and/or your care partner have signed paperwork which will be entered into your electronic medical record.  These signatures attest to the fact that that the information above on your After Visit Summary has been reviewed and is understood.  Full responsibility of the confidentiality of this discharge information lies with you  and/or your care-partner.

## 2021-05-12 ENCOUNTER — Telehealth: Payer: Self-pay

## 2021-05-12 ENCOUNTER — Encounter: Payer: Self-pay | Admitting: Gastroenterology

## 2021-05-12 NOTE — Telephone Encounter (Signed)
   Follow up Call-  Call back number 05/10/2021  Post procedure Call Back phone  # [phone]  Permission to leave phone message Yes  Some recent data might be hidden     Patient questions:  Do you have a fever, pain , or abdominal swelling? No. Pain Score  0 *  Have you tolerated food without any problems? Yes.    Have you been able to return to your normal activities? Yes.    Do you have any questions about your discharge instructions: Diet   No. Medications  No. Follow up visit  No.  Do you have questions or concerns about your Care? No.  Actions: * If pain score is 4 or above: No action needed, pain <4.

## 2021-05-17 ENCOUNTER — Ambulatory Visit (INDEPENDENT_AMBULATORY_CARE_PROVIDER_SITE_OTHER): Payer: Medicare Other

## 2021-05-17 ENCOUNTER — Other Ambulatory Visit: Payer: Self-pay

## 2021-05-17 DIAGNOSIS — Z7901 Long term (current) use of anticoagulants: Secondary | ICD-10-CM | POA: Diagnosis not present

## 2021-05-17 LAB — POCT INR: INR: 1.7 — AB (ref 2.0–3.0)

## 2021-05-17 NOTE — Progress Notes (Deleted)
Virtual Visit via Video Note   This visit type was conducted due to national recommendations for restrictions regarding the COVID-19 Pandemic (e.g. social distancing) in an effort to limit this patient's exposure and mitigate transmission in our community.  Due to her co-morbid illnesses, this patient is at least at moderate risk for complications without adequate follow up.  This format is felt to be most appropriate for this patient at this time.  All issues noted in this document were discussed and addressed.  A limited physical exam was performed with this format.  Please refer to the patient's chart for her consent to telehealth for [facility].      Date:  05/17/2021   ID:  [name] Foster, DOB 07/24/1951, MRN [id number]  Patient Location:Home Provider Location: Home  PCP:  Burnis Medin, MD  Cardiologist:  Dr Stanford Breed  Evaluation Performed:  Follow-Up Visit  Chief Complaint:  FU NICM and AI  History of Present Illness:    FU nonischemic cardiomyopathy and aortic insufficiency. Previous catheterization in 2004 showed no obstructive coronary disease. CT of her chest in Nov 2008 showed no thoracic aneurysm. Previous Holter monitor secondary to "dizzy spells" showed PACs and PVCs. Nuclear study April 2014 showed an ejection fraction of 62% and normal perfusion. Patient underwent transesophageal echocardiogram in May of 2014. Her ejection fraction was 50-55%. There was moderate aortic insufficiency and mild mitral regurgitation. There was a linear density associated with the atrial septum of uncertain etiology. Patient has had occasional noncompliance in the past with medications. If blood pressure not controlled LV function noted to be reduced with worsening aortic insufficiency. Her ACE inhibitor was discontinued previously because of dehydration/renal insufficiency. Last echo 8/22 showed EF 55, mild LVH, mild mitral regurgitation, moderate aortic insufficiency.  Since I last  saw her,   The patient does not have symptoms concerning for COVID-19 infection (fever, chills, cough, or new shortness of breath).    Past Medical History:  Diagnosis Date   Allergic rhinitis    Anemia    Aortic valve disorders    Arthritis    Asthma    Cancer ([place])    Breast Cancer   CHF (congestive heart failure) ([place])    Coronary artery disease    Family history of adverse reaction to anesthesia    difficulty waking mother up after surgery   Family history of breast cancer    Family history of cervical cancer    Family history of colon cancer    Family history of throat cancer    Heart murmur    Hyperlipidemia    Hypertension    Long term (current) use of anticoagulants    Other primary cardiomyopathies    Peripheral vascular disease ([place])    right leg   Personal history of colonic polyps    Personal history of venous thrombosis and embolism    Past Surgical History:  Procedure Laterality Date   ABDOMINAL HYSTERECTOMY     BREAST BIOPSY Left 07/2019   CARDIAC CATHETERIZATION  2004   DILATION AND CURETTAGE OF UTERUS     several   MASTECTOMY MODIFIED RADICAL Left 03/09/2020   Left Modified Radical Mastectomy (left mastectomy with axillary lymph node dissection)    MASTECTOMY MODIFIED RADICAL Left 03/09/2020   Procedure: LEFT MODIFIED RADICAL MASTECTOMY;  Surgeon: Stark Klein, MD;  Location: [facility];  Service: General;  Laterality: Left;  GEN AND PECTORAL BLOCK   PORT-A-CATH REMOVAL N/A 08/31/2020   Procedure: REMOVAL PORT-A-CATH;  Surgeon: Barry Dienes,  Dorris Fetch, MD;  Location: [facility];  Service: General;  Laterality: N/A;   PORTACATH PLACEMENT Left 08/18/2019   Procedure: INSERTION PORT-A-CATH;  Surgeon: Stark Klein, MD;  Location: [facility];  Service: General;  Laterality: Left;   Rt knee arthoscopic     SHOULDER ARTHROSCOPY W/ ROTATOR CUFF REPAIR Right 07/2016   TEE WITHOUT CARDIOVERSION N/A 02/21/2013   Procedure: TRANSESOPHAGEAL ECHOCARDIOGRAM (TEE);  Surgeon: Lelon Perla, MD;  Location[facility];  Service: Cardiovascular;  Laterality: N/A;     No outpatient medications have been marked as taking for the 05/27/21 encounter (Appointment) with Lelon Perla, MD.     Allergies:   Penicillins, Tetanus toxoid, and Aspirin   Social History   Tobacco Use   Smoking status: Former    Packs/day: 0.25    Types: Cigarettes    Quit date: 07/02/2019    Years since quitting: 1.8   Smokeless tobacco: Never  Vaping Use   Vaping Use: Never used  Substance Use Topics   Alcohol use: Yes    Comment: rare occasion   Drug use: No     Family Hx: The patient's family history includes Alcohol abuse in an other family member; Breast cancer in her cousin and maternal aunt; Cervical cancer in her maternal grandmother; Colon cancer in her mother; Depression in an other family member; Hyperlipidemia in an other family member; Hypertension in an other family member; Kidney disease in an other family member; Stroke in her paternal grandmother.  ROS:   Please see the history of present illness.    No Fever, chills  or productive cough All other systems reviewed and are negative.  Recent Labs: 12/20/2020: ALT 16; BUN 21; Creatinine, Ser 1.72; Hemoglobin 12.7; Platelets 230.0; Potassium 3.8; Sodium 142; TSH 2.52   Recent Lipid Panel Lab Results  Component Value Date/Time   CHOL 171 12/20/2020 11:36 AM   TRIG 116.0 12/20/2020 11:36 AM   TRIG 92 01/03/2010 12:00 AM   HDL 54.60 12/20/2020 11:36 AM   CHOLHDL 3 12/20/2020 11:36 AM   LDLCALC 94 12/20/2020 11:36 AM   LDLDIRECT 175.9 07/01/2007 10:48 AM    Wt Readings from Last 3 Encounters:  05/10/21 224 lb (101.6 kg)  04/21/21 224 lb (101.6 kg)  03/01/21 220 lb 0.3 oz (99.8 kg)     Objective:    Vital Signs:  LMP  (LMP Unknown)    VITAL SIGNS:  reviewed NAD Answers questions appropriately Normal affect Remainder of physical examination not performed (telehealth visit; coronavirus  pandemic)  ASSESSMENT & PLAN:    Aortic insufficiency-moderate on most recent echocardiogram.  We will continue to follow annual echocardiograms.  As outlined in previous notes her aortic insufficiency and LV function were reduced in the past when she was not compliant with her medications and blood pressure elevated. Hypertension-blood pressure is controlled.  Continue present medications. Nonischemic cardiomyopathy-LV function low normal on most recent echocardiogram.  Some reduction in the past when not compliant with antihypertensives. Hyperlipidemia-continue statin.  COVID-19 Education: The importance of social distancing was discussed today.  Time:   Today, I have spent 16 minutes with the patient with telehealth technology discussing the above problems.     Medication Adjustments/Labs and Tests Ordered: Current medicines are reviewed at length with the patient today.  Concerns regarding medicines are outlined above.   Tests Ordered: No orders of the defined types were placed in this encounter.   Medication Changes: No orders of the defined types were placed in this encounter.  Follow Up:  In Person in 1 year(s)  Signed, Kirk Ruths, MD  05/17/2021 3:30 PM    [place]

## 2021-05-17 NOTE — Patient Instructions (Addendum)
Pre visit review using our clinic review tool, if applicable. No additional management support is needed unless otherwise documented below in the visit note.  Increase dose today to 10 mg and then continue to take 1 (5 mg) tablet daily.  Recheck in 4 wks.

## 2021-05-19 NOTE — Progress Notes (Signed)
Medical screening examination/treatment/procedure(s) were performed by non-physician practitioner and as supervising physician I was immediately available for consultation/collaboration. I agree with above. Greely Atiyeh, MD   

## 2021-05-27 ENCOUNTER — Telehealth: Payer: Medicare Other | Admitting: Cardiology

## 2021-06-07 DIAGNOSIS — C50812 Malignant neoplasm of overlapping sites of left female breast: Secondary | ICD-10-CM | POA: Diagnosis not present

## 2021-06-07 DIAGNOSIS — I890 Lymphedema, not elsewhere classified: Secondary | ICD-10-CM | POA: Insufficient documentation

## 2021-06-07 DIAGNOSIS — Z171 Estrogen receptor negative status [ER-]: Secondary | ICD-10-CM | POA: Diagnosis not present

## 2021-06-13 ENCOUNTER — Other Ambulatory Visit: Payer: Self-pay

## 2021-06-13 ENCOUNTER — Ambulatory Visit (INDEPENDENT_AMBULATORY_CARE_PROVIDER_SITE_OTHER): Payer: Medicare Other | Admitting: Podiatry

## 2021-06-13 ENCOUNTER — Encounter: Payer: Self-pay | Admitting: Podiatry

## 2021-06-13 DIAGNOSIS — B351 Tinea unguium: Secondary | ICD-10-CM

## 2021-06-13 DIAGNOSIS — G629 Polyneuropathy, unspecified: Secondary | ICD-10-CM

## 2021-06-13 DIAGNOSIS — M79674 Pain in right toe(s): Secondary | ICD-10-CM

## 2021-06-13 DIAGNOSIS — D689 Coagulation defect, unspecified: Secondary | ICD-10-CM

## 2021-06-13 DIAGNOSIS — M79675 Pain in left toe(s): Secondary | ICD-10-CM | POA: Diagnosis not present

## 2021-06-15 ENCOUNTER — Other Ambulatory Visit (INDEPENDENT_AMBULATORY_CARE_PROVIDER_SITE_OTHER): Payer: Medicare Other

## 2021-06-15 ENCOUNTER — Other Ambulatory Visit: Payer: Self-pay

## 2021-06-15 ENCOUNTER — Ambulatory Visit (INDEPENDENT_AMBULATORY_CARE_PROVIDER_SITE_OTHER): Payer: Medicare Other

## 2021-06-15 DIAGNOSIS — N183 Chronic kidney disease, stage 3 unspecified: Secondary | ICD-10-CM | POA: Diagnosis not present

## 2021-06-15 DIAGNOSIS — Z7901 Long term (current) use of anticoagulants: Secondary | ICD-10-CM

## 2021-06-15 DIAGNOSIS — I110 Hypertensive heart disease with heart failure: Secondary | ICD-10-CM | POA: Diagnosis not present

## 2021-06-15 DIAGNOSIS — Z79899 Other long term (current) drug therapy: Secondary | ICD-10-CM | POA: Diagnosis not present

## 2021-06-15 DIAGNOSIS — E559 Vitamin D deficiency, unspecified: Secondary | ICD-10-CM

## 2021-06-15 DIAGNOSIS — R748 Abnormal levels of other serum enzymes: Secondary | ICD-10-CM

## 2021-06-15 LAB — HEPATIC FUNCTION PANEL
ALT: 13 U/L (ref 0–35)
AST: 15 U/L (ref 0–37)
Albumin: 4 g/dL (ref 3.5–5.2)
Alkaline Phosphatase: 118 U/L — ABNORMAL HIGH (ref 39–117)
Bilirubin, Direct: 0.1 mg/dL (ref 0.0–0.3)
Total Bilirubin: 0.4 mg/dL (ref 0.2–1.2)
Total Protein: 7.4 g/dL (ref 6.0–8.3)

## 2021-06-15 LAB — BASIC METABOLIC PANEL
BUN: 17 mg/dL (ref 6–23)
CO2: 27 mEq/L (ref 19–32)
Calcium: 9.1 mg/dL (ref 8.4–10.5)
Chloride: 101 mEq/L (ref 96–112)
Creatinine, Ser: 1.55 mg/dL — ABNORMAL HIGH (ref 0.40–1.20)
GFR: 34.08 mL/min — ABNORMAL LOW (ref >60.00)
Glucose, Bld: 74 mg/dL (ref 70–99)
Potassium: 3.5 mEq/L (ref 3.5–5.1)
Sodium: 138 mEq/L (ref 135–145)

## 2021-06-15 LAB — VITAMIN D 25 HYDROXY (VIT D DEFICIENCY, FRACTURES): VITD: 54.26 ng/mL (ref 30.00–100.00)

## 2021-06-15 LAB — POCT INR: INR: 1.5 — AB (ref 2.0–3.0)

## 2021-06-15 NOTE — Patient Instructions (Addendum)
Pre visit review using our clinic review tool, if applicable. No additional management support is needed unless otherwise documented below in the visit note.  Increase dose today to 7.5 mg and increase dose tomorrow to 7.5 mg and then change weekly dosing to take 5 mg daily except take 7.5 mg on Wednesday. Recheck in 4 wks.

## 2021-06-16 NOTE — Progress Notes (Signed)
Subjective: [name] Foster is a pleasant 70 y.o. female patient seen today for at risk foot care with h/o clotting disorder and painful thick toenails that are difficult to trim. Pain interferes with ambulation. Aggravating factors include wearing enclosed shoe gear. Pain is relieved with periodic professional debridement.   Patient also has h/o chemotherapy induced neuroapthy.  PCP is Panosh, Standley Brooking, MD. Last visit was: March, 2022.Marland Kitchen  Allergies  Allergen Reactions   Penicillins Rash    Did it involve swelling of the face/tongue/throat, SOB, or low BP? Unknown Did it involve sudden or severe rash/hives, skin peeling, or any reaction on the inside of your mouth or nose? Yes Did you need to seek medical attention at a hospital or doctor's office? Yes When did it last happen?      in her 51s If all above answers are "NO", may proceed with cephalosporin use.    Tetanus Toxoid Rash    Caused cellulitis    Aspirin Rash   Objective: Physical Exam  General: Skilar Marcou is a pleasant 70 y.o. African American female, morbidly obese in NAD. AAO x 3.   Vascular:  Capillary fill time to digits <3 seconds b/l lower extremities. Palpable pedal pulses b/l LE. Pedal hair sparse. Lower extremity skin temperature gradient within normal limits. No pain with calf compression b/l.  Dermatological:  Skin warm and supple b/l lower extremities. No open wounds b/l lower extremities. No interdigital macerations b/l lower extremities. Toenails b/l feet well maintained with adequate length. No erythema, no edema, no drainage, no fluctuance. Incurvated nailplate medial border(s) L 2nd toe.  Nail border hypertrophy minimal. There is tenderness to palpation. Sign(s) of infection: no clinical signs of infection noted on examination today. Evidence of permanent partial nail avulsion left hallux medial border and right hallux lateral border.  Musculoskeletal:  Normal muscle strength 5/5 to all lower  extremity muscle groups bilaterally. Pes planus deformity noted b/l lower extremities.  Neurological:  Protective sensation diminished with 10g monofilament b/l.  Assessment and Plan:  1. Pain due to onychomycosis of toenails of both feet   2. Blood clotting disorder ([place])   3. Neuropathy   -Patient to continue soft, supportive shoe gear daily. -Toenails 1-5 b/l were debrided in length and girth with sterile nail nippers and dremel without iatrogenic bleeding.  -Offending nail border debrided and curretaged L 2nd toe utilizing sterile nail nipper and currette. Border cleansed with alcohol and triple antibiotic applied. No further treatment required by patient/caregiver. She was instructed to schedule appointment with Dr. Paulla Dolly if left 2nd digit does not improve. -Patient to report any pedal injuries to medical professional immediately. -Patient/POA to call should there be question/concern in the interim.  Return in about 3 months (around 09/12/2021).  Marzetta Board, DPM

## 2021-06-17 ENCOUNTER — Ambulatory Visit: Payer: Medicare Other

## 2021-06-19 NOTE — Progress Notes (Signed)
Lab improved  . Plan 6 months  cpx and can do  follow  up labs at that time

## 2021-07-12 ENCOUNTER — Ambulatory Visit (INDEPENDENT_AMBULATORY_CARE_PROVIDER_SITE_OTHER): Payer: Medicare Other

## 2021-07-12 ENCOUNTER — Other Ambulatory Visit: Payer: Self-pay

## 2021-07-12 DIAGNOSIS — Z7901 Long term (current) use of anticoagulants: Secondary | ICD-10-CM | POA: Diagnosis not present

## 2021-07-12 DIAGNOSIS — Z23 Encounter for immunization: Secondary | ICD-10-CM | POA: Diagnosis not present

## 2021-07-12 LAB — POCT INR: INR: 3.1 — AB (ref 2.0–3.0)

## 2021-07-12 NOTE — Progress Notes (Signed)
Medical screening examination/treatment/procedure(s) were performed by non-physician practitioner and as supervising physician I was immediately available for consultation/collaboration. I agree with above. Celia Gibbons, MD   

## 2021-07-12 NOTE — Patient Instructions (Addendum)
Pre visit review using our clinic review tool, if applicable. No additional management support is needed unless otherwise documented below in the visit note.  Only take 2.5 mg today and then continue 5 mg daily except take 7.5 mg on Wednesday. Recheck in 4 wks.

## 2021-08-09 ENCOUNTER — Ambulatory Visit (INDEPENDENT_AMBULATORY_CARE_PROVIDER_SITE_OTHER): Payer: Medicare Other

## 2021-08-09 ENCOUNTER — Other Ambulatory Visit: Payer: Self-pay

## 2021-08-09 DIAGNOSIS — Z7901 Long term (current) use of anticoagulants: Secondary | ICD-10-CM | POA: Diagnosis not present

## 2021-08-09 LAB — POCT INR: INR: 2.7 (ref 2.0–3.0)

## 2021-08-09 NOTE — Patient Instructions (Addendum)
Pre visit review using our clinic review tool, if applicable. No additional management support is needed unless otherwise documented below in the visit note.  Continue 5 mg daily except take 7.5 mg on Wednesday. Recheck in 5 wks.

## 2021-08-09 NOTE — Progress Notes (Signed)
Patient ID: [name] Foster, female   DOB: [birth year], 70 y.o.   MRN: [id number]  Medical screening examination/treatment/procedure(s) were performed by non-physician practitioner and as supervising physician I was immediately available for consultation/collaboration.  I agree with above. Cathlean Cower, MD

## 2021-08-09 NOTE — Progress Notes (Signed)
Continue 5 mg daily except take 7.5 mg on Wednesday. Recheck in 5 wks.

## 2021-08-12 ENCOUNTER — Other Ambulatory Visit: Payer: Self-pay | Admitting: Hematology and Oncology

## 2021-08-12 DIAGNOSIS — Z1231 Encounter for screening mammogram for malignant neoplasm of breast: Secondary | ICD-10-CM

## 2021-08-21 ENCOUNTER — Other Ambulatory Visit: Payer: Self-pay | Admitting: Internal Medicine

## 2021-08-21 ENCOUNTER — Other Ambulatory Visit: Payer: Self-pay | Admitting: Cardiology

## 2021-08-21 DIAGNOSIS — Z7901 Long term (current) use of anticoagulants: Secondary | ICD-10-CM

## 2021-08-22 NOTE — Telephone Encounter (Signed)
Pt is compliant with coumadin management and PCP apts.  Sent in refill

## 2021-09-12 ENCOUNTER — Encounter: Payer: Self-pay | Admitting: Podiatry

## 2021-09-12 ENCOUNTER — Ambulatory Visit (INDEPENDENT_AMBULATORY_CARE_PROVIDER_SITE_OTHER): Payer: Medicare Other | Admitting: Podiatry

## 2021-09-12 ENCOUNTER — Other Ambulatory Visit: Payer: Self-pay

## 2021-09-12 DIAGNOSIS — M79674 Pain in right toe(s): Secondary | ICD-10-CM | POA: Diagnosis not present

## 2021-09-12 DIAGNOSIS — L600 Ingrowing nail: Secondary | ICD-10-CM

## 2021-09-12 DIAGNOSIS — G629 Polyneuropathy, unspecified: Secondary | ICD-10-CM | POA: Diagnosis not present

## 2021-09-12 DIAGNOSIS — M79675 Pain in left toe(s): Secondary | ICD-10-CM

## 2021-09-12 DIAGNOSIS — B351 Tinea unguium: Secondary | ICD-10-CM

## 2021-09-12 DIAGNOSIS — D689 Coagulation defect, unspecified: Secondary | ICD-10-CM

## 2021-09-13 ENCOUNTER — Ambulatory Visit (INDEPENDENT_AMBULATORY_CARE_PROVIDER_SITE_OTHER): Payer: Medicare Other

## 2021-09-13 DIAGNOSIS — Z7901 Long term (current) use of anticoagulants: Secondary | ICD-10-CM

## 2021-09-13 LAB — POCT INR: INR: 4.3 — AB (ref 2.0–3.0)

## 2021-09-13 NOTE — Progress Notes (Signed)
Hold dose today and then change weekly dose to take 1 tablet daily except take 1/2 tablet on Mondays. Recheck in 2 weeks.

## 2021-09-13 NOTE — Patient Instructions (Addendum)
Pre visit review using our clinic review tool, if applicable. No additional management support is needed unless otherwise documented below in the visit note.  Hold dose today and then change weekly dose to take 1 tablet daily except take 1/2 tablet on Mondays. Recheck in 2 weeks.

## 2021-09-15 NOTE — Progress Notes (Signed)
Subjective:  Patient ID: [name] Foster, female    DOB: 1950/12/21,  MRN: [id number]  Tedi Hughson presents to clinic today for at risk foot care with history of peripheral neuropathy and painful elongated mycotic toenails 1-5 bilaterally which are tender when wearing enclosed shoe gear. Pain is relieved with periodic professional debridement.  Patient states she has been experiencing intermittent bleeding of bilateral 3rd digits since last visit. She states she has been soaking  in epsom salt and applying antibiotic ointment. Symptoms resolve about 3-5 days, then bleeding starts again. She denies any redness, pus or edema.   PCP is Panosh, Standley Brooking, MD , and last visit was 12/20/2020.  Allergies  Allergen Reactions   Penicillins Rash    Did it involve swelling of the face/tongue/throat, SOB, or low BP? Unknown Did it involve sudden or severe rash/hives, skin peeling, or any reaction on the inside of your mouth or nose? Yes Did you need to seek medical attention at a hospital or doctor's office? Yes When did it last happen?      in her 44s If all above answers are "NO", may proceed with cephalosporin use.  Did it involve swelling of the face/tongue/throat, SOB, or low BP? Unknown Did it involve sudden or severe rash/hives, skin peeling, or any reaction on the inside of your mouth or nose? Yes Did you need to seek medical attention at a hospital or doctor's office? Yes When did it last happen?      in her 61s If all above answers are "NO", may proceed with cephalosporin use. Did it involve swelling of the face/tongue/throat, SOB, or low BP? Unknown Did it involve sudden or severe rash/hives, skin peeling, or any reaction on the inside of your mouth or nose? Yes Did you need to seek medical attention at a hospital or doctor's office? Yes When did it last happen?      in her 33s If all above answers are "NO", may proceed with cephalosporin use.   Tetanus Toxoid Rash    Caused  cellulitis    Aspirin Rash    Review of Systems: Negative except as noted in the HPI. Objective:   Constitutional [name] Foster is a pleasant 70 y.o. African American female, in NAD. AAO x 3.   Vascular CFT <3 seconds b/l LE. Palpable DP/PT pulses b/l LE. Digital hair sparse b/l. Skin temperature gradient WNL b/l. No pain with calf compression b/l. No edema noted b/l. No cyanosis or clubbing noted b/l LE.  Neurologic Normal speech. Oriented to person, place, and time. Protective sensation diminished with 10g monofilament b/l.  Dermatologic Pedal integument with normal turgor, texture and tone b/l LE. No open wounds b/l. No interdigital macerations b/l. Toenails L hallux, L 2nd toe, L 4th toe, L 5th toe, R hallux, R 2nd toe, R 4th toe, and R 5th toe elongated, thickened, discolored with subungual debris. +Tenderness with dorsal palpation of nailplates. No hyperkeratotic or porokeratotic lesions present. Incurvated nailplate lateral  border(s) L 3rd toe and R 3rd toe.  Nail border hypertrophy present. There is tenderness to palpation. Granulation tissue noted lateral border of left 3rd toe. Sign(s) of infection: no clinical signs of infection noted on examination today..  Orthopedic: Muscle strength 5/5 to all lower extremity muscle groups bilaterally. Pes planus deformity noted bilateral LE.   Radiographs: None  Last A1c:  Hemoglobin A1C Latest Ref Rng & Units 12/20/2020  HGBA1C 4.6 - 6.5 % 5.8  Some recent data might be hidden   Assessment:  1. Pain due to onychomycosis of toenails of both feet   2. Ingrown toenail without infection   3. Blood clotting disorder ([place])   4. Neuropathy    Plan:  Patient was evaluated and treated and all questions answered. Consent given for treatment as described below: -Examined patient. -Mycotic toenails L hallux, L 2nd toe, L 4th toe, L 5th toe, R hallux, R 2nd toe, R 4th toe, and R 5th toe were debrided in length and girth with sterile nail  nippers and dremel without iatrogenic bleeding. -Offending nail border debrided and curretaged L 3rd toe and R 3rd toe utilizing sterile nail nipper and currette. Border(s) cleansed with alcohol and triple antibiotic ointment applied. Dispensed written instructions for once daily epsom salt soaks for 7 days. -Patient scheduled to see Dr. Paulla Dolly in one week for follow up of ingrown toenails b/l 3rd toes. -Patient/POA to call should there be question/concern in the interim.  Return in about 1 week (around 09/19/2021).  Marzetta Board, DPM

## 2021-09-16 ENCOUNTER — Ambulatory Visit (INDEPENDENT_AMBULATORY_CARE_PROVIDER_SITE_OTHER): Payer: Medicare Other | Admitting: Podiatry

## 2021-09-16 ENCOUNTER — Other Ambulatory Visit: Payer: Self-pay

## 2021-09-16 ENCOUNTER — Encounter: Payer: Self-pay | Admitting: Podiatry

## 2021-09-16 DIAGNOSIS — D689 Coagulation defect, unspecified: Secondary | ICD-10-CM | POA: Diagnosis not present

## 2021-09-16 DIAGNOSIS — L600 Ingrowing nail: Secondary | ICD-10-CM

## 2021-09-16 NOTE — Patient Instructions (Signed)

## 2021-09-17 NOTE — Progress Notes (Signed)
Subjective:   Patient ID: [name] Foster, female   DOB: 70 y.o.   MRN: [id number]   HPI Patient presents concerned about possibility for ingrown toenail or in section of the third nails of both feet after they were trimmed by another physician.  States that she noted some drainage it seems improved but she has diabetes and wants this checked   ROS      Objective:  Physical Exam  Neuro vascular status unchanged from previous visit with slight irritation of the left third nailbed lateral border and slight on the right but they are nonpainful currently there is no erythema edema or drainage noted     Assessment:  Probability for low-grade paronychia which appears resolved third nail bilateral and may have somewhat chronic ingrown toenail deformity     Plan:  H NP reviewed condition and currently I do not recommend treatment with no active drainage no pain and what appears to be healing sites.  If any abnormalities were to occur or for any drainage she is to reappoint immediately but I do think this looks uneventful and the team

## 2021-09-27 ENCOUNTER — Ambulatory Visit
Admission: RE | Admit: 2021-09-27 | Discharge: 2021-09-27 | Disposition: A | Discharge status: Home / Self Care | Payer: Medicare Other | Source: Ambulatory Visit | Attending: Hematology and Oncology | Admitting: Hematology and Oncology

## 2021-09-27 ENCOUNTER — Ambulatory Visit (INDEPENDENT_AMBULATORY_CARE_PROVIDER_SITE_OTHER): Payer: Medicare Other

## 2021-09-27 ENCOUNTER — Other Ambulatory Visit: Payer: Self-pay

## 2021-09-27 DIAGNOSIS — Z7901 Long term (current) use of anticoagulants: Secondary | ICD-10-CM

## 2021-09-27 DIAGNOSIS — Z1231 Encounter for screening mammogram for malignant neoplasm of breast: Secondary | ICD-10-CM

## 2021-09-27 HISTORY — DX: Personal history of antineoplastic chemotherapy: Z92.21

## 2021-09-27 HISTORY — DX: Personal history of irradiation: Z92.3

## 2021-09-27 LAB — POCT INR: INR: 2.8 (ref 2.0–3.0)

## 2021-09-27 IMAGING — MG MM DIGITAL SCREENING UNILAT*R* W/ TOMO W/ CAD
4 series · 4 of 12 positions shown · non-contrast
Comparison: Previous exam(s).

CLINICAL DATA: Screening.

EXAM:
DIGITAL SCREENING UNILATERAL RIGHT MAMMOGRAM WITH CAD AND
TOMOSYNTHESIS
TECHNIQUE: Right screening digital craniocaudal and mediolateral oblique
mammograms were obtained. Right screening digital breast
tomosynthesis was performed. The images were evaluated with
computer-aided detection.

[R CC synth-2D]
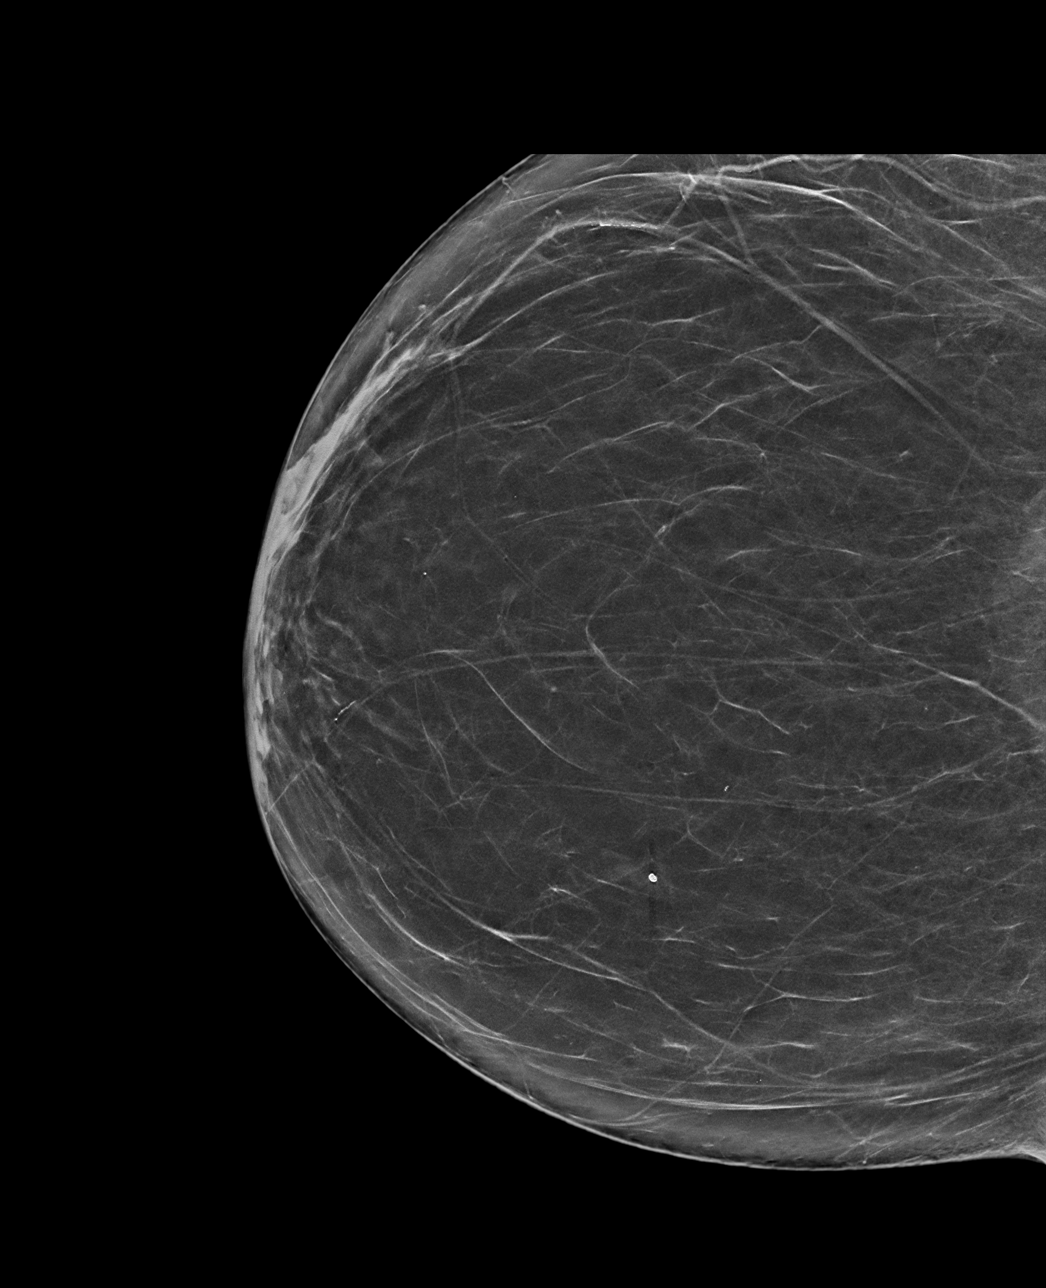

[R MLO synth-2D]
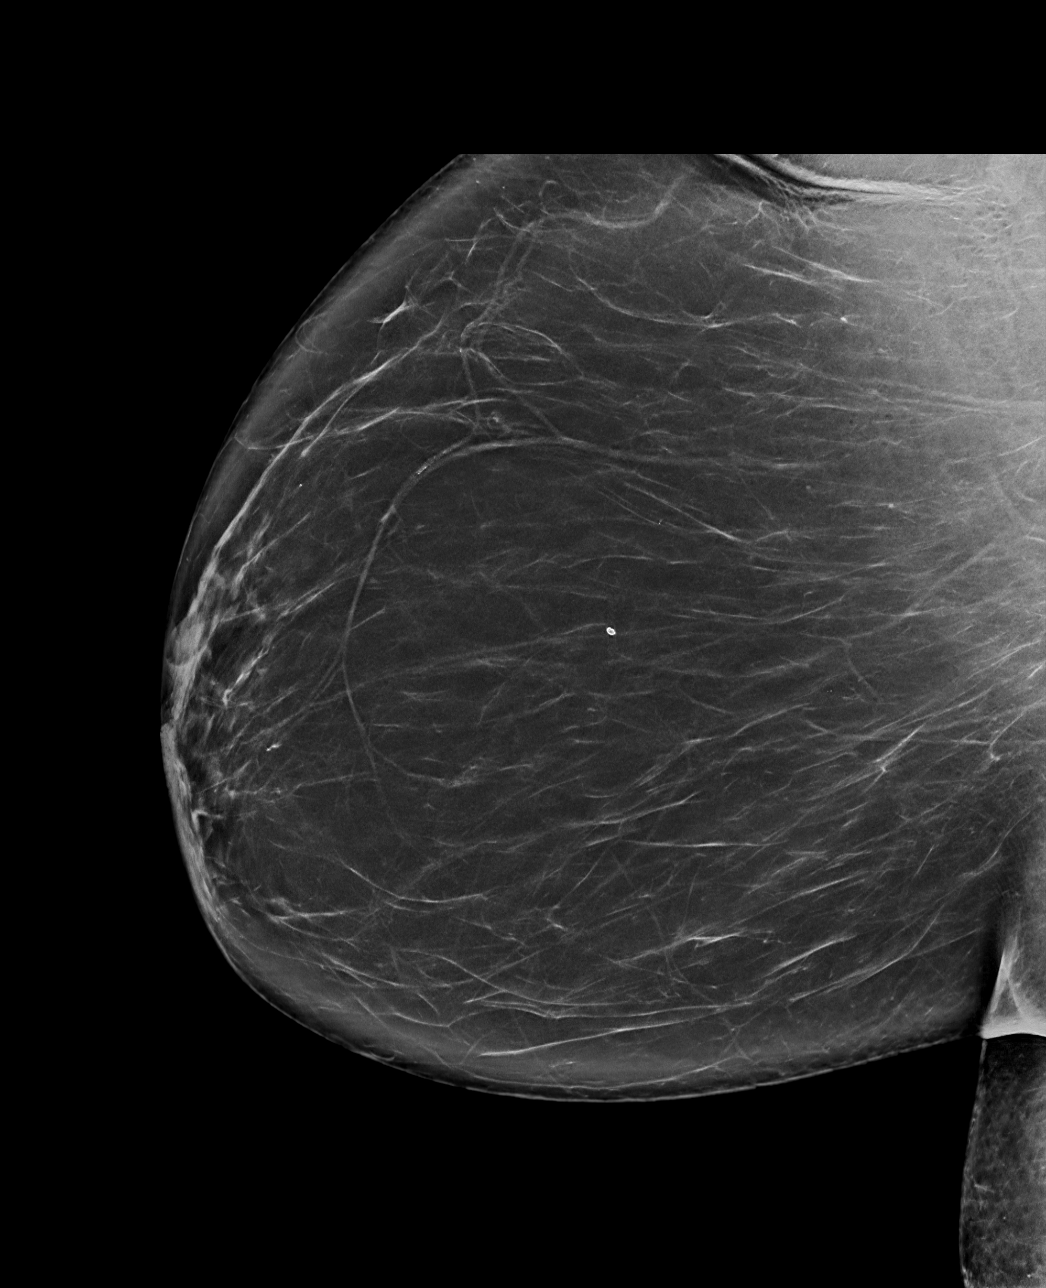

[R CC tomo · tomo slice 39/76.0]
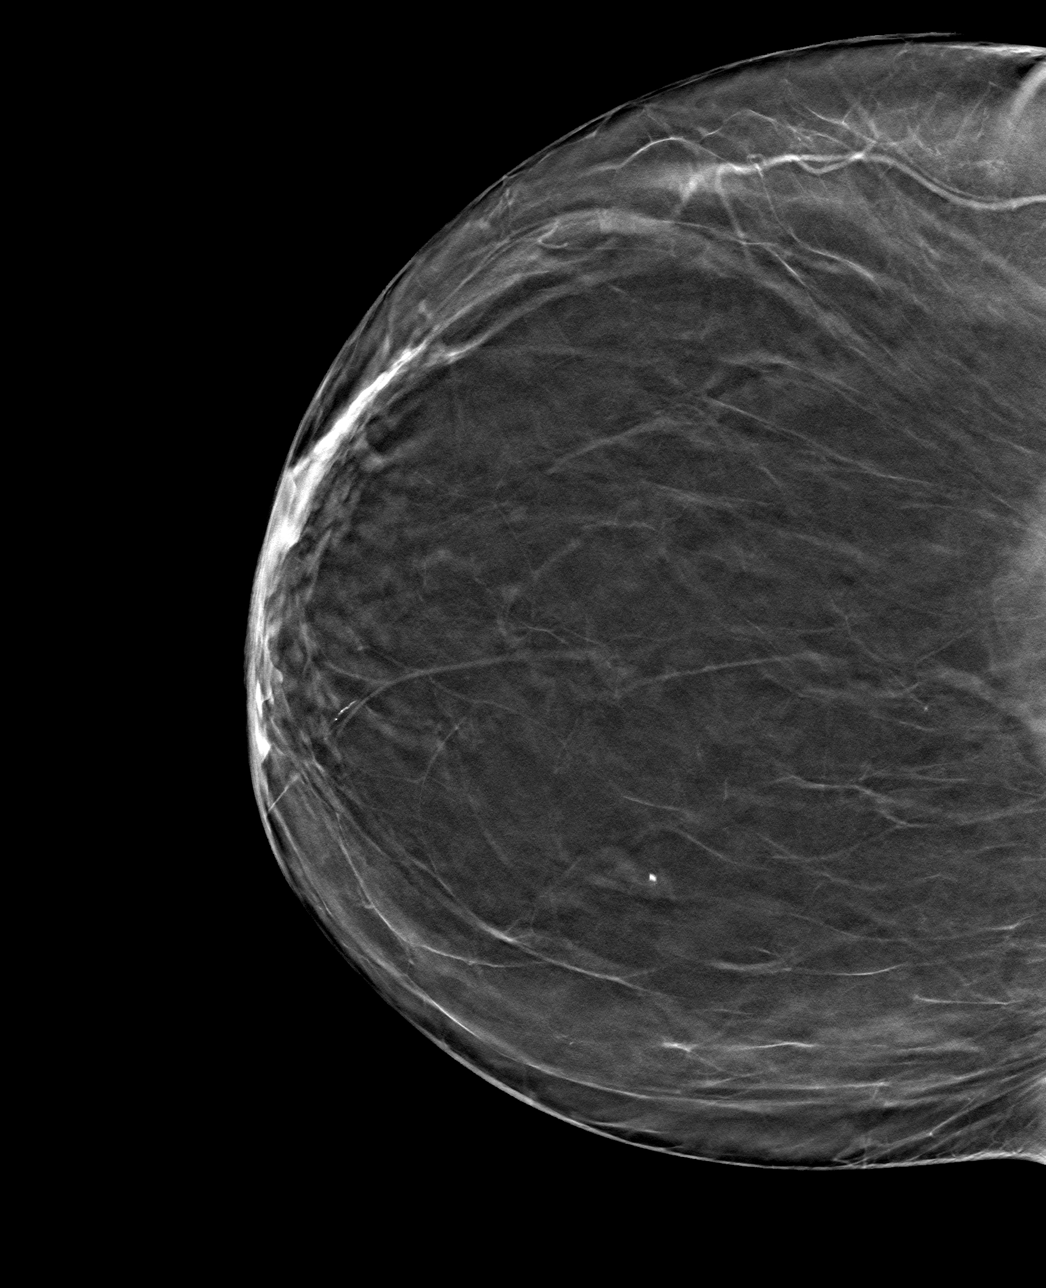

[R MLO tomo · tomo slice 47/92.0]
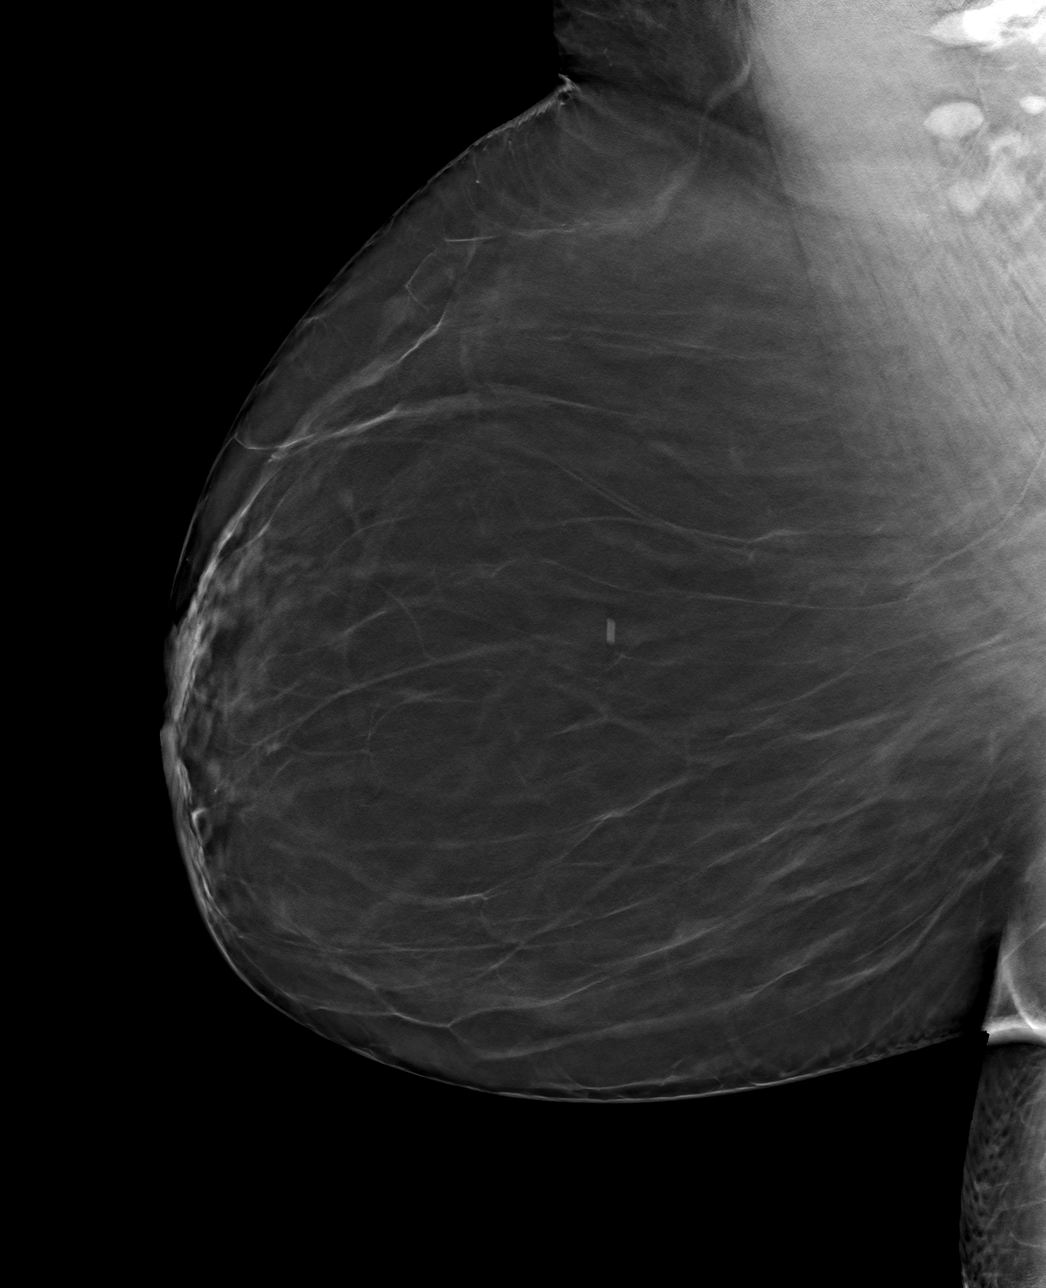

[4 of 12 positions shown; findings below may reference images not displayed]

ACR Breast Density Category b: There are scattered areas of
fibroglandular density.
FINDINGS: There are no findings suspicious for malignancy.
IMPRESSION: No mammographic evidence of malignancy. A result letter of this
screening mammogram will be mailed directly to the patient.

RECOMMENDATION:
Screening mammogram in one year. (Code:[2H])

BI-RADS CATEGORY  1: Negative.

## 2021-09-27 NOTE — Patient Instructions (Addendum)
Pre visit review using our clinic review tool, if applicable. No additional management support is needed unless otherwise documented below in the visit note.  Continue 1 tablet daily except take 1/2 tablet on Mondays.  Re-check in 6  weeks.   

## 2021-09-27 NOTE — Progress Notes (Signed)
Continue 1 tablet daily except take 1/2 tablet on Mondays.  Re-check in 6  weeks.   

## 2021-10-05 NOTE — Progress Notes (Signed)
HPI: FU nonischemic cardiomyopathy and aortic insufficiency. Previous catheterization in 2004 showed no obstructive coronary disease. CT of her chest in Nov 2008 showed no thoracic aneurysm. Previous Holter monitor secondary to "dizzy spells" showed PACs and PVCs. Nuclear study April 2014 showed an ejection fraction of 62% and normal perfusion. Patient underwent transesophageal echocardiogram in May of 2014. Her ejection fraction was 50-55%. There was moderate aortic insufficiency and mild mitral regurgitation. There was a linear density associated with the atrial septum of uncertain etiology. Patient has had occasional noncompliance in the past with medications. If blood pressure not controlled LV function noted to be reduced with worsening aortic insufficiency. Her ACE inhibitor was discontinued previously because of dehydration/renal insufficiency.  Last echocardiogram August 2022 showed ejection fraction 55%, distal septal hypokinesis, mild mitral regurgitation, moderate aortic insufficiency; no change compared to previous.  Since I last saw her, she does note increased bilateral lower extremity edema.  There is dyspnea with more vigorous activities but not routine activities.  She denies chest pain or syncope.  Current Outpatient Medications  Medication Sig Dispense Refill   Albuterol Sulfate (PROAIR RESPICLICK) 233 (90 Base) MCG/ACT AEPB Inhale 2 puffs into the lungs every 6 (six) hours as needed. 2 each 1   amLODipine (NORVASC) 10 MG tablet TAKE 1 TABLET BY MOUTH  DAILY 90 tablet 3   atorvastatin (LIPITOR) 20 MG tablet TAKE 1 TABLET BY MOUTH  DAILY 90 tablet 3   Carboxymethylcellul-Glycerin (CLEAR EYES FOR DRY EYES) 1-0.25 % SOLN Place 1 drop into both eyes 3 (three) times daily as needed (dry eyes).      Cholecalciferol (VITAMIN D) 50 MCG (2000 UT) tablet Take 2,000 Units by mouth daily.     fexofenadine (ALLEGRA) 180 MG tablet Take 180 mg by mouth daily as needed for allergies.       fluticasone (FLONASE) 50 MCG/ACT nasal spray USE 2 SPRAYS IN EACH NOSTRIL DAILY. PT NEEDS TO SCHEDULE A FOLLOW UP APPT BEFORE NEXT REFILL. (Patient taking differently: Place 2 sprays into both nostrils daily.) 16 g 0   hydrALAZINE (APRESOLINE) 50 MG tablet TAKE 1 TABLET BY MOUTH 3  TIMES DAILY 270 tablet 3   methocarbamol (ROBAXIN) 500 MG tablet Take 1 tablet (500 mg total) by mouth every 6 (six) hours as needed for muscle spasms. 20 tablet 1   metoprolol tartrate (LOPRESSOR) 50 MG tablet TAKE 1 TABLET BY MOUTH  TWICE DAILY 180 tablet 3   sodium chloride (OCEAN) 0.65 % nasal spray Place into the nose.     traMADol (ULTRAM) 50 MG tablet Take 1 tablet (50 mg total) by mouth every 6 (six) hours as needed (mild pain). 20 tablet 0   warfarin (COUMADIN) 5 MG tablet TAKE 1 TABLET BY MOUTH  DAILY EXCEPT TAKE 1 1/2 TABLETS ON WEDNESDAYS OR AS DIRECTED BY  ANTICOAGULATION CLINIC 100 tablet 1   furosemide (LASIX) 40 MG tablet Take 1 tablet by mouth daily. (Patient not taking: Reported on 10/18/2021)     potassium chloride SA (KLOR-CON) 20 MEQ tablet Take 20 mEq by mouth daily. (Patient not taking: Reported on 03/01/2021)     No current facility-administered medications for this visit.     Past Medical History:  Diagnosis Date   Allergic rhinitis    Anemia    Aortic valve disorders    Arthritis    Asthma    Cancer ([place])    Breast Cancer   CHF (congestive heart failure) ([place])    Coronary artery  disease    Family history of adverse reaction to anesthesia    difficulty waking mother up after surgery   Family history of breast cancer    Family history of cervical cancer    Family history of colon cancer    Family history of throat cancer    Heart murmur    Hyperlipidemia    Hypertension    Long term (current) use of anticoagulants    Other primary cardiomyopathies    Peripheral vascular disease ([place])    right leg   Personal history of chemotherapy    Personal history of colonic polyps     Personal history of radiation therapy    Personal history of venous thrombosis and embolism     Past Surgical History:  Procedure Laterality Date   ABDOMINAL HYSTERECTOMY     BREAST BIOPSY Left 07/2019   CARDIAC CATHETERIZATION  2004   DILATION AND CURETTAGE OF UTERUS     several   MASTECTOMY MODIFIED RADICAL Left 03/09/2020   Left Modified Radical Mastectomy (left mastectomy with axillary lymph node dissection)    MASTECTOMY MODIFIED RADICAL Left 03/09/2020   Procedure: LEFT MODIFIED RADICAL MASTECTOMY;  Surgeon: Stark Klein, MD;  Location: [facility];  Service: General;  Laterality: Left;  GEN AND PECTORAL BLOCK   PORT-A-CATH REMOVAL N/A 08/31/2020   Procedure: REMOVAL PORT-A-CATH;  Surgeon: Stark Klein, MD;  Location: [facility];  Service: General;  Laterality: N/A;   PORTACATH PLACEMENT Left 08/18/2019   Procedure: INSERTION PORT-A-CATH;  Surgeon: Stark Klein, MD;  Location: [facility];  Service: General;  Laterality: Left;   Rt knee arthoscopic     SHOULDER ARTHROSCOPY W/ ROTATOR CUFF REPAIR Right 07/2016   TEE WITHOUT CARDIOVERSION N/A 02/21/2013   Procedure: TRANSESOPHAGEAL ECHOCARDIOGRAM (TEE);  Surgeon: Lelon Perla, MD;  Location: [facility];  Service: Cardiovascular;  Laterality: N/A;    Social History   Socioeconomic History   Marital status: Legally Separated    Spouse name: Not on file   Number of children: Not on file   Years of education: Not on file   Highest education level: Not on file  Occupational History    Comment: retired  Tobacco Use   Smoking status: Former    Packs/day: 0.25    Types: Cigarettes    Quit date: 07/02/2019    Years since quitting: 2.2   Smokeless tobacco: Never  Vaping Use   Vaping Use: Never used  Substance and Sexual Activity   Alcohol use: Yes    Comment: rare occasion   Drug use: No   Sexual activity: Not on file    Comment: Hysterectomy  Other Topics Concern   Not on file  Social History Narrative   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    Divorced   Regular exercise- no   5 hours sleep    Lives with a son age 76    No pets         Social Determinants of Health   Financial Resource Strain: Medium Risk   Difficulty of Paying Living Expenses: Somewhat hard  Food Insecurity: No Food Insecurity   Worried About Charity fundraiser in the Last Year: Never true   Ran Out of Food in the Last Year: Never true  Transportation Needs: No Transportation Needs   Lack of Transportation (Medical): No   Lack of Transportation (Non-Medical): No  Physical Activity: Inactive   Days of Exercise per Week: 0 days   Minutes of Exercise  per Session: 0 min  Stress: No Stress Concern Present   Feeling of Stress : Not at all  Social Connections: Moderately Isolated   Frequency of Communication with Friends and Family: Three times a week   Frequency of Social Gatherings with Friends and Family: Once a week   Attends Religious Services: 1 to 4 times per year   Active Member of [employer] or Organizations: No   Attends Music therapist: Never   Marital Status: Separated  Human resources officer Violence: Not At Risk   Fear of Current or Ex-Partner: No   Emotionally Abused: No   Physically Abused: No   Sexually Abused: No    Family History  Problem Relation Age of Onset   Alcohol abuse Other    Depression Other    Hyperlipidemia Other    Hypertension Other    Kidney disease Other    Colon cancer Mother        dx late 48s   Cervical cancer Maternal Grandmother    Stroke Paternal Grandmother    Breast cancer Maternal Aunt        dx 51s   Breast cancer Cousin        dx 70s    ROS: no fevers or chills, productive cough, hemoptysis, dysphasia, odynophagia, melena, hematochezia, dysuria, hematuria, rash, seizure activity, orthopnea, PND, claudication. Remaining systems are negative.  Physical Exam: Well-developed well-nourished in no acute distress.  Skin is warm and dry.  HEENT is normal.  Neck is  supple.  Chest is clear to auscultation with normal expansion.  Cardiovascular exam is regular rate and rhythm.  Abdominal exam nontender or distended. No masses palpated. Extremities show trace edema. neuro grossly intact  ECG-sinus bradycardia at a rate of 56, left axis deviation, left ventricular hypertrophy, nonspecific ST changes.  Personally reviewed  A/P  1 aortic insufficiency-moderate on most recent echocardiogram.  Plan follow-up study August 2023.  AI has appeared worse in the past when she has been noncompliant with her medications and blood pressure elevated.  2 history of nonischemic cardiomyopathy-LV function normal on most recent echocardiogram.  3 hypertension-blood pressure elevated; she also notes lower extremity edema.  I will begin Lasix 20 mg daily.  Check potassium and renal function in 1 week.  If blood pressure remains elevated can advance hydralazine or change metoprolol to carvedilol.  4 hyperlipidemia-continue statin.  5 chronic stage III kidney disease-follow renal function closely with the addition of Lasix.  6 history of recurrent DVTs-continue Coumadin.  Per primary care.  Kirk Ruths, MD

## 2021-10-18 ENCOUNTER — Ambulatory Visit: Payer: Medicare Other | Admitting: Cardiology

## 2021-10-18 ENCOUNTER — Telehealth: Payer: Self-pay | Admitting: Licensed Clinical Social Worker

## 2021-10-18 ENCOUNTER — Other Ambulatory Visit: Payer: Self-pay

## 2021-10-18 ENCOUNTER — Encounter: Payer: Self-pay | Admitting: Cardiology

## 2021-10-18 VITALS — BP 146/90 | HR 56 | Ht 66.0 in | Wt 232.4 lb

## 2021-10-18 DIAGNOSIS — I10 Essential (primary) hypertension: Secondary | ICD-10-CM | POA: Diagnosis not present

## 2021-10-18 DIAGNOSIS — I351 Nonrheumatic aortic (valve) insufficiency: Secondary | ICD-10-CM

## 2021-10-18 DIAGNOSIS — I119 Hypertensive heart disease without heart failure: Secondary | ICD-10-CM

## 2021-10-18 DIAGNOSIS — I43 Cardiomyopathy in diseases classified elsewhere: Secondary | ICD-10-CM

## 2021-10-18 DIAGNOSIS — I428 Other cardiomyopathies: Secondary | ICD-10-CM | POA: Diagnosis not present

## 2021-10-18 DIAGNOSIS — I5032 Chronic diastolic (congestive) heart failure: Secondary | ICD-10-CM

## 2021-10-18 MED ORDER — FUROSEMIDE 40 MG PO TABS: 20.0000 mg | ORAL_TABLET | Freq: Every day | ORAL | 3 refills | Status: DC

## 2021-10-18 NOTE — Patient Instructions (Signed)
Medication Instructions:   START FUROSEMIDE 20 MG ONCE DAILY = 1/2 OF THE 40 MG TABLET ONCE DAILY  *If you need a refill on your cardiac medications before your next appointment, please call your pharmacy*   Lab Work:  Your physician recommends that you return for lab work in: [place]  If you have labs (blood work) drawn today and your tests are completely normal, you will receive your results only by: [employer] (if you have MyChart) OR A paper copy in the mail If you have any lab test that is abnormal or we need to change your treatment, we will call you to review the results.   Follow-Up: At [facility], you and your health needs are our priority.  As part of our continuing mission to provide you with exceptional heart care, we have created designated Provider Care Teams.  These Care Teams include your primary Cardiologist (physician) and Advanced Practice Providers (APPs -  Physician Assistants and Nurse Practitioners) who all work together to provide you with the care you need, when you need it.  We recommend signing up for the patient portal called "MyChart".  Sign up information is provided on this After Visit Summary.  MyChart is used to connect with patients for Virtual Visits (Telemedicine).  Patients are able to view lab/test results, encounter notes, upcoming appointments, etc.  Non-urgent messages can be sent to your provider as well.   To learn more about what you can do with MyChart, go to [email or web address].    Your next appointment:   6 month(s)  The format for your next appointment:   In Person  Provider:   Kirk Ruths, MD

## 2021-10-18 NOTE — Telephone Encounter (Signed)
Assisted pt with ride home today, I also assisted pt with ride to get labs done on 1/26 at [facility] office.   Westley Hummer, MSW, [place]  [phone]- work cell phone (preferred) [phone]- desk phone

## 2021-10-27 ENCOUNTER — Telehealth: Payer: Self-pay | Admitting: Licensed Clinical Social Worker

## 2021-10-27 NOTE — Telephone Encounter (Signed)
Assisted pt with ride last week to get to office to complete labs.  LCSW spoke with lab staff and can assist with pt getting a ride home. I attempted to call and speak with pt directly. No answer, voicemail left.   Westley Hummer, MSW, [place]  [phone]- work cell phone (preferred) [phone]- desk phone

## 2021-11-08 ENCOUNTER — Ambulatory Visit (INDEPENDENT_AMBULATORY_CARE_PROVIDER_SITE_OTHER): Payer: Medicare Other

## 2021-11-08 ENCOUNTER — Other Ambulatory Visit: Payer: Self-pay

## 2021-11-08 DIAGNOSIS — Z7901 Long term (current) use of anticoagulants: Secondary | ICD-10-CM

## 2021-11-08 LAB — POCT INR: INR: 2.8 (ref 2.0–3.0)

## 2021-11-08 NOTE — Patient Instructions (Addendum)
Pre visit review using our clinic review tool, if applicable. No additional management support is needed unless otherwise documented below in the visit note.  Continue 1 tablet daily except take 1/2 tablet on Mondays.  Re-check in 6  weeks.   

## 2021-11-08 NOTE — Progress Notes (Signed)
Continue 1 tablet daily except take 1/2 tablet on Mondays.  Re-check in 6  weeks.   

## 2021-11-15 NOTE — Progress Notes (Signed)
Patient Care Team: Panosh, Standley Brooking, MD as PCP - General Stanford Breed, Denice Bors, MD as PCP - Cardiology (Cardiology) Stanford Breed Denice Bors, MD (Cardiology) Susa Day, MD (Orthopedic Surgery) Mauro Kaufmann, RN as Oncology Nurse Navigator Rockwell Germany, RN as Oncology Nurse Navigator Nicholas Lose, MD as Consulting Physician (Hematology and Oncology)  DIAGNOSIS:    ICD-10-CM   1. Malignant neoplasm of overlapping sites of left breast in female, estrogen receptor negative ([place])  C50.812    Z17.1       SUMMARY OF ONCOLOGIC HISTORY: Oncology History  Malignant neoplasm of overlapping sites of left breast in female, estrogen receptor negative ([place])  08/01/2019 Initial Diagnosis   Patient palpated left breast lump and skin thickening. Mammogram showed a 3.6cm mass at 3:30 position, a 0.9cm mass at 3:00 position, a 0.7cm mass at 2:00 position, a 0.5cm mass at 2:00 position, a 0.3cm mass at 1:00 position, and a 0.4cm mass at 1:00 position, with 4 abnormal left axillary lymph nodes. Biopsy showed IDC, grade 3, HER-2 + (3+), ER/PR -, Ki67 70%, in the breast and lymph nodes.    08/06/2019 Cancer Staging   Staging form: Breast, AJCC 8th Edition - Clinical: Stage IIIB (cT4, cN1, cM0, G3, ER-, PR-, HER2+) - Signed by Nicholas Lose, MD on 08/06/2019    08/19/2019 - 01/02/2020 Chemotherapy   The patient had dexamethasone (DECADRON) 4 MG tablet, 4 mg (100 % of original dose 4 mg), Oral, 2 times daily, 1 of 1 cycle, Start date: 08/06/2019, End date: 01/19/2020 Dose modification: 4 mg (original dose 4 mg, Cycle 0) palonosetron (ALOXI) injection 0.25 mg, 0.25 mg, Intravenous,  Once, 6 of 6 cycles Administration: 0.25 mg (08/19/2019), 0.25 mg (09/08/2019), 0.25 mg (12/12/2019), 0.25 mg (01/02/2020), 0.25 mg (10/31/2019), 0.25 mg (11/22/2019) pegfilgrastim-cbqv (UDENYCA) injection 6 mg, 6 mg, Subcutaneous, Once, 2 of 2 cycles Administration: 6 mg (08/21/2019), 6 mg (09/10/2019) CARBOplatin (PARAPLATIN) 480 mg in  sodium chloride 0.9 % 250 mL chemo infusion, 480 mg (110.9 % of original dose 429.6 mg), Intravenous,  Once, 2 of 2 cycles Dose modification:   (original dose 429.6 mg, Cycle 1) Administration: 480 mg (08/19/2019), 340 mg (09/08/2019) DOCEtaxel (TAXOTERE) 160 mg in sodium chloride 0.9 % 250 mL chemo infusion, 75 mg/m2 = 160 mg, Intravenous,  Once, 6 of 6 cycles Dose modification: 60 mg/m2 (original dose 75 mg/m2, Cycle 2, Reason: Dose not tolerated), 50 mg/m2 (original dose 75 mg/m2, Cycle 5, Reason: Dose not tolerated) Administration: 160 mg (08/19/2019), 130 mg (09/08/2019), 110 mg (12/12/2019), 110 mg (01/02/2020), 110 mg (10/31/2019), 110 mg (11/22/2019) pertuzumab (PERJETA) 420 mg in sodium chloride 0.9 % 250 mL chemo infusion, 420 mg (100 % of original dose 420 mg), Intravenous, Once, 2 of 2 cycles Dose modification: 420 mg (original dose 420 mg, Cycle 1, Reason: Provider Judgment) Administration: 420 mg (08/19/2019), 420 mg (09/08/2019) fosaprepitant (EMEND) 150 mg, dexamethasone (DECADRON) 12 mg in sodium chloride 0.9 % 145 mL IVPB, , Intravenous,  Once, 3 of 3 cycles Administration:  (08/19/2019),  (09/08/2019),  (10/31/2019) trastuzumab-anns (KANJINTI) 750 mg in sodium chloride 0.9 % 250 mL chemo infusion, 756 mg (100 % of original dose 8 mg/kg), Intravenous,  Once, 6 of 6 cycles Dose modification: 8 mg/kg (original dose 8 mg/kg, Cycle 1, Reason: Other (see comments), Comment: change to approved brand), 6 mg/kg (original dose 6 mg/kg, Cycle 2, Reason: Other (see comments), Comment: brand change per insurance) Administration: 750 mg (08/19/2019), 567 mg (09/08/2019), 567 mg (10/31/2019), 567 mg (11/22/2019),  567 mg (12/12/2019), 504 mg (01/02/2020)   for chemotherapy treatment.     09/21/2019 - 10/03/2019 Hospital Admission   Intractable nausea and vomiting   01/23/2020 -  Chemotherapy   The patient had trastuzumab-anns (KANJINTI) 504 mg in sodium chloride 0.9 % 250 mL chemo infusion, 6 mg/kg = 504 mg  (100 % of original dose 6 mg/kg), Intravenous,  Once, 10 of 10 cycles Dose modification: 6 mg/kg (original dose 6 mg/kg, Cycle 1, Reason: Other (see comments), Comment: biosimilar conversion; auth'd for Kanjinti) Administration: 504 mg (01/23/2020), 504 mg (02/13/2020), 504 mg (03/05/2020), 504 mg (04/16/2020), 504 mg (05/06/2020), 504 mg (05/27/2020), 504 mg (06/17/2020), 504 mg (07/08/2020), 504 mg (07/29/2020), 504 mg (08/19/2020)   for chemotherapy treatment.     03/09/2020 Surgery   Left mastectomy and left axillary lymph node dissection ([facility]): no residual carcinoma, 2 negative intramammary lymph nodes, and 19 negative left axillary lymph nodes.    05/27/2020 - 07/12/2020 Radiation Therapy   Adjuvant left chest wall radiation     CHIEF COMPLIANT: Follow-up and surveillance of history of left breast cancer  INTERVAL HISTORY: [name] Foster is a 71 y.o. with above-mentioned history of left breast cancer. She presents to the clinic today for follow-up.  She denies any lumps or nodules in the breast.  ALLERGIES:  is allergic to penicillins, tetanus toxoid, and aspirin.  MEDICATIONS:  Current Outpatient Medications  Medication Sig Dispense Refill   Albuterol Sulfate (PROAIR RESPICLICK) 863 (90 Base) MCG/ACT AEPB Inhale 2 puffs into the lungs every 6 (six) hours as needed. 2 each 1   amLODipine (NORVASC) 10 MG tablet TAKE 1 TABLET BY MOUTH  DAILY 90 tablet 3   atorvastatin (LIPITOR) 20 MG tablet TAKE 1 TABLET BY MOUTH  DAILY 90 tablet 3   Carboxymethylcellul-Glycerin (CLEAR EYES FOR DRY EYES) 1-0.25 % SOLN Place 1 drop into both eyes 3 (three) times daily as needed (dry eyes).      Cholecalciferol (VITAMIN D) 50 MCG (2000 UT) tablet Take 2,000 Units by mouth daily.     fexofenadine (ALLEGRA) 180 MG tablet Take 180 mg by mouth daily as needed for allergies.      fluticasone (FLONASE) 50 MCG/ACT nasal spray USE 2 SPRAYS IN EACH NOSTRIL DAILY. PT NEEDS TO SCHEDULE A FOLLOW UP APPT BEFORE NEXT  REFILL. (Patient taking differently: Place 2 sprays into both nostrils daily.) 16 g 0   furosemide (LASIX) 40 MG tablet Take 0.5 tablets (20 mg total) by mouth daily. 90 tablet 3   hydrALAZINE (APRESOLINE) 50 MG tablet TAKE 1 TABLET BY MOUTH 3  TIMES DAILY 270 tablet 3   methocarbamol (ROBAXIN) 500 MG tablet Take 1 tablet (500 mg total) by mouth every 6 (six) hours as needed for muscle spasms. 20 tablet 1   metoprolol tartrate (LOPRESSOR) 50 MG tablet TAKE 1 TABLET BY MOUTH  TWICE DAILY 180 tablet 3   potassium chloride SA (KLOR-CON) 20 MEQ tablet Take 20 mEq by mouth daily. (Patient not taking: Reported on 03/01/2021)     sodium chloride (OCEAN) 0.65 % nasal spray Place into the nose.     traMADol (ULTRAM) 50 MG tablet Take 1 tablet (50 mg total) by mouth every 6 (six) hours as needed (mild pain). 20 tablet 0   warfarin (COUMADIN) 5 MG tablet TAKE 1 TABLET BY MOUTH  DAILY EXCEPT TAKE 1 1/2 TABLETS ON WEDNESDAYS OR AS DIRECTED BY  ANTICOAGULATION CLINIC 100 tablet 1   No current facility-administered medications for this visit.  PHYSICAL EXAMINATION: ECOG PERFORMANCE STATUS: 1 - Symptomatic but completely ambulatory  Vitals:   11/16/21 1031  BP: (!) 156/66  Pulse: 63  Resp: 18  Temp: 97.8 F (36.6 C)  SpO2: 99%   Filed Weights   11/16/21 1031  Weight: 233 lb 8 oz (105.9 kg)    BREAST: Left mastectomy without any palpable lumps or nodules.  Right breast is benign. (exam performed in the presence of a chaperone)  LABORATORY DATA:  I have reviewed the data as listed CMP Latest Ref Rng & Units 06/15/2021 12/20/2020 08/31/2020  Glucose 70 - 99 mg/dL 74 90 102(H)  BUN 6 - 23 mg/dL '17 21 14  ' Creatinine 0.40 - 1.20 mg/dL 1.55(H) 1.72(H) 1.87(H)  Sodium 135 - 145 mEq/L 138 142 139  Potassium 3.5 - 5.1 mEq/L 3.5 3.8 3.7  Chloride 96 - 112 mEq/L 101 103 106  CO2 19 - 32 mEq/L '27 29 24  ' Calcium 8.4 - 10.5 mg/dL 9.1 9.5 8.9  Total Protein 6.0 - 8.3 g/dL 7.4 7.2 -  Total Bilirubin 0.2 -  1.2 mg/dL 0.4 0.4 -  Alkaline Phos 39 - 117 U/L 118(H) 128(H) -  AST 0 - 37 U/L 15 16 -  ALT 0 - 35 U/L 13 16 -    Lab Results  Component Value Date   WBC 5.9 12/20/2020   HGB 12.7 12/20/2020   HCT 38.0 12/20/2020   MCV 87.1 12/20/2020   PLT 230.0 12/20/2020   NEUTROABS 3.4 12/20/2020    ASSESSMENT & PLAN:  Malignant neoplasm of overlapping sites of left breast in female, estrogen receptor negative ([place]) 07/31/2020:Patient palpated left breast lump and skin thickening. Mammogram showed a 3.6cm mass at 3:30 position, a 0.9cm mass at 3:00 position, a 0.7cm mass at 2:00 position, a 0.5cm mass at 2:00 position, a 0.3cm mass at 1:00 position, and a 0.4cm mass at 1:00 position, with 4 abnormal left axillary lymph nodes. Biopsy showed IDC, grade 3, HER-2 + (3+), ER/PR -, Ki67 70%, in the breast and lymph nodes.    Treatment plan  1. Neoadjuvant chemotherapy with [facility] Perjeta 6 cycles completed 01/02/2020 followed by Herceptin maintenance completed 08/19/20  2. 03/09/2020: Left mastectomy and left axillary lymph node dissection ([facility]): no residual carcinoma, 2 negative intramammary lymph nodes, and 19 negative left axillary lymph nodes. 3. Followed by adjuvant radiation therapy completed 07/12/2020  --------------------------------------------------------------------------------------------------------------------------------------------- Left arm lymphedema: She is waiting for a lymphedema pump to arrive.  She is still working with physical therapy.    Breast Cancer Surveillance: 09/27/2021: right breast mammogram: Benign, density B 11/16/2021: Breast exam: Benign   Return to clinic in 1 year for surveillance check and follow-up.    No orders of the defined types were placed in this encounter.  The patient has a good understanding of the overall plan. she agrees with it. she will call with any problems that may develop before the next visit here.  Total time spent: 20 mins including face  to face time and time spent for planning, charting and coordination of care  Rulon Eisenmenger, MD, MPH 11/16/2021  I, Thana Ates, am acting as scribe for Dr. Nicholas Lose.  I have reviewed the above documentation for accuracy and completeness, and I agree with the above.

## 2021-11-15 NOTE — Assessment & Plan Note (Signed)
07/31/2020:Patient palpated left breast lump and skin thickening. Mammogram showed a 3.6cm mass at 3:30 position, a 0.9cm mass at 3:00 position, a 0.7cm mass at 2:00 position, a 0.5cm mass at 2:00 position, a 0.3cm mass at 1:00 position, and a 0.4cm mass at 1:00 position, with 4 abnormal left axillary lymph nodes. Biopsy showed IDC, grade 3, HER-2 + (3+), ER/PR -, Ki67 70%, in the breast and lymph nodes.  Treatment plan 1. Neoadjuvant chemotherapy with [place] Perjeta 6 cyclescompleted 01/02/2020 followed by Herceptinmaintenance completed 08/19/20 2. 03/09/2020:Left mastectomy and left axillary lymph node dissection ([facility]): no residual carcinoma, 2 negative intramammary lymph nodes, and 19 negative left axillary lymph nodes. 3. Followed by adjuvant radiation therapycompleted 07/12/2020 --------------------------------------------------------------------------------------------------------------------------------------------- Left arm lymphedema:She is waiting for a lymphedema pump to arrive.  She is still working with physical therapy.   Breast Cancer Surveillance: 09/28/20: right breast mammogram: Benign, density B 02/16/21: Breast exam: Benign  Return to clinic in 1 year for surveillance check and follow-up.

## 2021-11-16 ENCOUNTER — Inpatient Hospital Stay: Payer: Medicare Other | Attending: Hematology and Oncology | Admitting: Hematology and Oncology

## 2021-11-16 ENCOUNTER — Other Ambulatory Visit: Payer: Self-pay

## 2021-11-16 DIAGNOSIS — Z923 Personal history of irradiation: Secondary | ICD-10-CM | POA: Insufficient documentation

## 2021-11-16 DIAGNOSIS — Z79899 Other long term (current) drug therapy: Secondary | ICD-10-CM | POA: Insufficient documentation

## 2021-11-16 DIAGNOSIS — C50812 Malignant neoplasm of overlapping sites of left female breast: Secondary | ICD-10-CM

## 2021-11-16 DIAGNOSIS — Z853 Personal history of malignant neoplasm of breast: Secondary | ICD-10-CM | POA: Diagnosis not present

## 2021-11-16 DIAGNOSIS — Z7901 Long term (current) use of anticoagulants: Secondary | ICD-10-CM | POA: Insufficient documentation

## 2021-11-16 DIAGNOSIS — Z9012 Acquired absence of left breast and nipple: Secondary | ICD-10-CM | POA: Diagnosis not present

## 2021-11-16 DIAGNOSIS — Z171 Estrogen receptor negative status [ER-]: Secondary | ICD-10-CM | POA: Diagnosis not present

## 2021-11-16 NOTE — Research (Signed)
[facility]U-[facility]-[id number] - TREATMENT OF ESTABLISHED CHEMOTHERAPY-INDUCED NEUROPATHY WITH N-PALMITOYLETHANOLAMIDE, A CANNABIMIMETIC NUTRACEUTICAL: A RANDOMIZED DOUBLE-BLIND PHASE II PILOT TRIAL Patient [name] Foster was identified by Dr Lindi Adie as a potential candidate for the above listed study.  This Clinical Research Nurse met with [name] Foster, [id number], on 11/16/21 in a manner and location that ensures patient privacy to discuss participation in the above listed research study.  Patient is Unaccompanied.  A copy of the informed consent document with embedded HIPAA language was provided to the patient.  Patient reads, speaks, and understands [place].   Patient was provided with the business card of this Nurse and encouraged to contact the research team with any questions.  Approximately 20 minutes were spent with the patient reviewing the informed consent documents.  Patient was provided the option of taking informed consent documents home to review and was encouraged to review at their convenience with their support network, including other care providers. Patient took the consent documents home to review. Patient reported a neuropathy score of 5/10 today. She requested a follow-up call on 11/22/21 to discuss further participation in the study. Vickii Penna, RN, BSN, CPN Clinical Research Nurse I [phone] 11/16/2021 11:27 AM

## 2021-11-23 ENCOUNTER — Encounter: Payer: Self-pay | Admitting: *Deleted

## 2021-11-25 ENCOUNTER — Telehealth: Payer: Self-pay

## 2021-11-25 NOTE — Telephone Encounter (Signed)
[facility]CRU-[facility]-2102 - TREATMENT OF ESTABLISHED CHEMOTHERAPY-INDUCED NEUROPATHY WITH N-PALMITOYLETHANOLAMIDE, A CANNABIMIMETIC NUTRACEUTICAL: A RANDOMIZED DOUBLE-BLIND PHASE II PILOT TRIAL  Called to follow-up with patient regarding interest in above listed study. Upon eligibility review, patient's creatinine is out of range for study's inclusion criteria. Told patient she was unable to participate with current lab values. Will alert referring MD.  Vickii Penna, RN, BSN, CPN Clinical Research Nurse I [phone]  11/25/2021 11:14 AM

## 2021-12-12 ENCOUNTER — Other Ambulatory Visit: Payer: Self-pay

## 2021-12-12 ENCOUNTER — Encounter: Payer: Self-pay | Admitting: Podiatry

## 2021-12-12 ENCOUNTER — Ambulatory Visit: Payer: Medicare Other | Admitting: Podiatry

## 2021-12-12 DIAGNOSIS — B351 Tinea unguium: Secondary | ICD-10-CM | POA: Diagnosis not present

## 2021-12-12 DIAGNOSIS — M79674 Pain in right toe(s): Secondary | ICD-10-CM | POA: Diagnosis not present

## 2021-12-12 DIAGNOSIS — M79675 Pain in left toe(s): Secondary | ICD-10-CM

## 2021-12-18 NOTE — Progress Notes (Signed)
?  Subjective:  ?Patient ID: [name] Foster, female    DOB: May 04, 1951,  MRN: [id number] ? ?[name] Foster presents to clinic today for at risk foot care. Patient has history of neuropathy and coagulopathy and painful elongated mycotic toenails 1-5 bilaterally which are tender when wearing enclosed shoe gear. Pain is relieved with periodic professional debridement. ? ?She relates no new problems with her 3rd digits on today's visit. ? ?New problem(s): None.  ? ?PCP is Panosh, Standley Brooking, MD , and last visit was December 20, 2020. ? ?Allergies  ?Allergen Reactions  ? Penicillins Rash  ?  Did it involve swelling of the face/tongue/throat, SOB, or low BP? Unknown ?Did it involve sudden or severe rash/hives, skin peeling, or any reaction on the inside of your mouth or nose? Yes ?Did you need to seek medical attention at a hospital or doctor's office? Yes ?When did it last happen?      in her 21s ?If all above answers are "NO", may proceed with cephalosporin use. ? ?Did it involve swelling of the face/tongue/throat, SOB, or low BP? Unknown ?Did it involve sudden or severe rash/hives, skin peeling, or any reaction on the inside of your mouth or nose? Yes ?Did you need to seek medical attention at a hospital or doctor's office? Yes ?When did it last happen?      in her 93s ?If all above answers are "NO", may proceed with cephalosporin use. ?Did it involve swelling of the face/tongue/throat, SOB, or low BP? Unknown ?Did it involve sudden or severe rash/hives, skin peeling, or any reaction on the inside of your mouth or nose? Yes ?Did you need to seek medical attention at a hospital or doctor's office? Yes ?When did it last happen?      in her 72s ?If all above answers are "NO", may proceed with cephalosporin use.  ? Tetanus Toxoid Rash  ?  Caused cellulitis   ? Aspirin Rash  ? ? ?Review of Systems: Negative except as noted in the HPI. ? ?Objective: No changes noted in today's physical examination. ?Constitutional Pt is  a pleasant 71 y.o. African American female obese in NAD. AAO x 3.   ?Vascular Capillary fill time to digits <3 seconds b/l lower extremities. Palpable DP pulse(s) b/l lower extremities Palpable PT pulse(s) b/l lower extremities Pedal hair sparse. Lower extremity skin temperature gradient within normal limits. No pain with calf compression b/l.  ?Neurologic Protective sensation diminished with 10g monofilament b/l.  ?Dermatologic Toenails 1-5 b/l elongated, discolored, dystrophic, thickened, crumbly with subungual debris and tenderness to dorsal palpation. Incurvated nailplates digits bilateral 2nd and 3rd digits.  ?Orthopedic: Normal muscle strength 5/5 to all lower extremity muscle groups bilaterally. No pain crepitus or joint limitation noted with ROM b/l. Pes planus deformity noted b/l.   ? ?Radiographs: None ?Hemoglobin A1C Latest Ref Rng & Units 12/20/2020  ?HGBA1C 4.6 - 6.5 % 5.8  ?Some recent data might be hidden  ? ?Assessment/Plan: ?1. Pain due to onychomycosis of toenails of both feet   ?  ?-Patient to continue soft, supportive shoe gear daily. ?-Toenails 1-5 b/l were debrided in length and girth with sterile nail nippers and dremel without iatrogenic bleeding.  ?-Patient/POA to call should there be question/concern in the interim.  ?Return in about 3 months (around 03/14/2022). ? ?Marzetta Board, DPM  ?

## 2021-12-20 ENCOUNTER — Ambulatory Visit (INDEPENDENT_AMBULATORY_CARE_PROVIDER_SITE_OTHER): Payer: Medicare Other

## 2021-12-20 ENCOUNTER — Other Ambulatory Visit: Payer: Self-pay

## 2021-12-20 DIAGNOSIS — Z7901 Long term (current) use of anticoagulants: Secondary | ICD-10-CM

## 2021-12-20 LAB — POCT INR: INR: 2.9 (ref 2.0–3.0)

## 2021-12-20 NOTE — Progress Notes (Signed)
Continue 1 tablet daily except take 1/2 tablet on Mondays.  Re-check in 6  weeks.   

## 2021-12-20 NOTE — Patient Instructions (Addendum)
Pre visit review using our clinic review tool, if applicable. No additional management support is needed unless otherwise documented below in the visit note.  Continue 1 tablet daily except take 1/2 tablet on Mondays.  Re-check in 6  weeks.   

## 2022-01-26 ENCOUNTER — Telehealth: Payer: Self-pay

## 2022-01-26 NOTE — Telephone Encounter (Signed)
Called pt to complete SDOH Screening. Pt has transportation services included with her insurance company. Pt is advised to call them for upcoming appts on 01/31/22 (coumadin appt) & CPE on 02/15/22. Pt verb understanding. ?

## 2022-01-31 ENCOUNTER — Ambulatory Visit (INDEPENDENT_AMBULATORY_CARE_PROVIDER_SITE_OTHER): Payer: Medicare Other

## 2022-01-31 DIAGNOSIS — Z7901 Long term (current) use of anticoagulants: Secondary | ICD-10-CM

## 2022-01-31 LAB — POCT INR: INR: 2.9 (ref 2.0–3.0)

## 2022-01-31 NOTE — Patient Instructions (Addendum)
Pre visit review using our clinic review tool, if applicable. No additional management support is needed unless otherwise documented below in the visit note.  Continue 1 tablet daily except take 1/2 tablet on Mondays.  Re-check in 6  weeks.   

## 2022-01-31 NOTE — Progress Notes (Signed)
Continue 1 tablet daily except take 1/2 tablet on Mondays.  Re-check in 6  weeks.   

## 2022-02-14 ENCOUNTER — Other Ambulatory Visit: Payer: Self-pay | Admitting: Internal Medicine

## 2022-02-14 DIAGNOSIS — Z7901 Long term (current) use of anticoagulants: Secondary | ICD-10-CM

## 2022-02-14 NOTE — Telephone Encounter (Signed)
Pt is compliant with warfarin management and PCP apts. ?Sent in refill.  ?

## 2022-02-15 ENCOUNTER — Encounter: Payer: Medicare Other | Admitting: Internal Medicine

## 2022-03-07 ENCOUNTER — Ambulatory Visit (INDEPENDENT_AMBULATORY_CARE_PROVIDER_SITE_OTHER): Payer: Medicare Other

## 2022-03-07 ENCOUNTER — Telehealth: Payer: Self-pay

## 2022-03-07 VITALS — Ht 66.0 in | Wt 230.0 lb

## 2022-03-07 DIAGNOSIS — Z0000 Encounter for general adult medical examination without abnormal findings: Secondary | ICD-10-CM

## 2022-03-07 NOTE — Telephone Encounter (Signed)
   Telephone encounter was:  Unsuccessful.  03/07/2022 Name: Bryanna Yim MRN: [id number] DOB: 13-Jul-1951  Unsuccessful outbound call made today to assist with:  Food Insecurity  Outreach Attempt:  1st Attempt  A HIPAA compliant voice message was left requesting a return call.  Instructed patient to call back at earliest convenience. [facility], Care Management  [phone] [address], [place], [place] [address] Phone: [phone] Email: [email or web address]

## 2022-03-07 NOTE — Progress Notes (Signed)
I connected with  Brandee Markin today via telehealth video enabled device and verified that I am speaking with the correct person using two identifiers.   Location: Patient: home Provider: work  Persons participating in virtual visit: Briea Moody-Haskins, Glenna Durand LPN  I discussed the limitations, risks, security and privacy concerns of performing an evaluation and management service by video and the availability of in person appointments. The patient expressed understanding and agreed to proceed.   Some vital signs may be absent or patient reported.     Subjective:   [name] Foster is a 71 y.o. female who presents for Medicare Annual (Subsequent) preventive examination.  Review of Systems     Cardiac Risk Factors include: advanced age (>45mn, >>5women);dyslipidemia;hypertension;obesity (BMI >30kg/m2)     Objective:    Today's Vitals   03/07/22 1212  Weight: 230 lb (104.3 kg)  Height: '5\' 6"'$  (1.676 m)   Body mass index is 37.12 kg/m.     03/07/2022   12:19 PM 03/01/2021   11:56 AM 07/29/2020   12:15 PM 07/08/2020   10:45 AM 06/17/2020   10:05 AM 05/06/2020   12:08 PM 05/04/2020    9:03 AM  Advanced Directives  Does Patient Have a Medical Advance Directive? No No No No No No No  Would patient like information on creating a medical advance directive?  Yes (MAU/Ambulatory/Procedural Areas - Information given) No - Patient declined No - Patient declined No - Patient declined No - Patient declined     Current Medications (verified) Outpatient Encounter Medications as of 03/07/2022  Medication Sig   Albuterol Sulfate (PROAIR RESPICLICK) 1630(90 Base) MCG/ACT AEPB Inhale 2 puffs into the lungs every 6 (six) hours as needed.   amLODipine (NORVASC) 10 MG tablet TAKE 1 TABLET BY MOUTH  DAILY   atorvastatin (LIPITOR) 20 MG tablet TAKE 1 TABLET BY MOUTH  DAILY   Carboxymethylcellul-Glycerin (CLEAR EYES FOR DRY EYES) 1-0.25 % SOLN Place 1 drop into both eyes 3 (three)  times daily as needed (dry eyes).    Cholecalciferol (VITAMIN D) 50 MCG (2000 UT) tablet Take 2,000 Units by mouth daily.   fexofenadine (ALLEGRA) 180 MG tablet Take 180 mg by mouth daily as needed for allergies.    fluticasone (FLONASE) 50 MCG/ACT nasal spray USE 2 SPRAYS IN EACH NOSTRIL DAILY. PT NEEDS TO SCHEDULE A FOLLOW UP APPT BEFORE NEXT REFILL. (Patient taking differently: Place 2 sprays into both nostrils daily.)   hydrALAZINE (APRESOLINE) 50 MG tablet TAKE 1 TABLET BY MOUTH 3  TIMES DAILY   metoprolol tartrate (LOPRESSOR) 50 MG tablet TAKE 1 TABLET BY MOUTH  TWICE DAILY   sodium chloride (OCEAN) 0.65 % nasal spray Place into the nose.   warfarin (COUMADIN) 5 MG tablet TAKE 1 TABLET BY MOUTH DAILY EXCEPT TAKE 1 AND 1/2 TABLETS BY MOUTH ON MONDAYS OR AS  DIRECTED BY ANTICOAGULATION  CLINIC   furosemide (LASIX) 40 MG tablet Take 0.5 tablets (20 mg total) by mouth daily.   potassium chloride SA (KLOR-CON) 20 MEQ tablet Take 20 mEq by mouth daily. (Patient not taking: Reported on 03/01/2021)   [DISCONTINUED] prochlorperazine (COMPAZINE) 10 MG tablet Take 1 tablet (10 mg total) by mouth every 6 (six) hours as needed (Nausea or vomiting).   No facility-administered encounter medications on file as of 03/07/2022.    Allergies (verified) Penicillins, Tetanus toxoid, and Aspirin   History: Past Medical History:  Diagnosis Date   Allergic rhinitis    Anemia    Aortic valve  disorders    Arthritis    Asthma    Cancer ([place])    Breast Cancer   CHF (congestive heart failure) ([place])    Coronary artery disease    Family history of adverse reaction to anesthesia    difficulty waking mother up after surgery   Family history of breast cancer    Family history of cervical cancer    Family history of colon cancer    Family history of throat cancer    Heart murmur    Hyperlipidemia    Hypertension    Long term (current) use of anticoagulants    Other primary cardiomyopathies    Peripheral  vascular disease ([place])    right leg   Personal history of chemotherapy    Personal history of colonic polyps    Personal history of radiation therapy    Personal history of venous thrombosis and embolism    Past Surgical History:  Procedure Laterality Date   ABDOMINAL HYSTERECTOMY     BREAST BIOPSY Left 07/2019   CARDIAC CATHETERIZATION  2004   DILATION AND CURETTAGE OF UTERUS     several   MASTECTOMY MODIFIED RADICAL Left 03/09/2020   Left Modified Radical Mastectomy (left mastectomy with axillary lymph node dissection)    MASTECTOMY MODIFIED RADICAL Left 03/09/2020   Procedure: LEFT MODIFIED RADICAL MASTECTOMY;  Surgeon: Stark Klein, MD;  Location: [facility];  Service: General;  Laterality: Left;  GEN AND PECTORAL BLOCK   PORT-A-CATH REMOVAL N/A 08/31/2020   Procedure: REMOVAL PORT-A-CATH;  Surgeon: Stark Klein, MD;  Location: [facility];  Service: General;  Laterality: N/A;   PORTACATH PLACEMENT Left 08/18/2019   Procedure: INSERTION PORT-A-CATH;  Surgeon: Stark Klein, MD;  Location: [facility];  Service: General;  Laterality: Left;   Rt knee arthoscopic     SHOULDER ARTHROSCOPY W/ ROTATOR CUFF REPAIR Right 07/2016   TEE WITHOUT CARDIOVERSION N/A 02/21/2013   Procedure: TRANSESOPHAGEAL ECHOCARDIOGRAM (TEE);  Surgeon: Lelon Perla, MD;  [facility] END[facility]COPY;  Service: Cardiovascular;  Laterality: N/A;   Family History  Problem Relation Age of Onset   Alcohol abuse Other    Depression Other    Hyperlipidemia Other    Hypertension Other    Kidney disease Other    Colon cancer Mother        dx late 32s   Cervical cancer Maternal Grandmother    Stroke Paternal Grandmother    Breast cancer Maternal Aunt        dx 64s   Breast cancer Cousin        dx 73s   Social History   Socioeconomic History   Marital status: Legally Separated    Spouse name: Not on file   Number of children: Not on file   Years of education: Not on file   Highest education level: Not on file   Occupational History    Comment: retired  Tobacco Use   Smoking status: Former    Packs/day: 0.25    Types: Cigarettes    Quit date: 07/02/2019    Years since quitting: 2.6   Smokeless tobacco: Never  Vaping Use   Vaping Use: Never used  Substance and Sexual Activity   Alcohol use: Yes    Comment: rare occasion   Drug use: No   Sexual activity: Not on file    Comment: Hysterectomy  Other Topics Concern   Not on file  Social History Narrative   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  Divorced   Regular exercise- no   5 hours sleep    Lives with a son age 33    No pets         Social Determinants of Health   Financial Resource Strain: Medium Risk   Difficulty of Paying Living Expenses: Somewhat hard  Food Insecurity: No Food Insecurity   Worried About Charity fundraiser in the Last Year: Never true   Ran Out of Food in the Last Year: Never true  Transportation Needs: No Transportation Needs   Lack of Transportation (Medical): No   Lack of Transportation (Non-Medical): No  Physical Activity: Inactive   Days of Exercise per Week: 0 days   Minutes of Exercise per Session: 0 min  Stress: No Stress Concern Present   Feeling of Stress : Only a little  Social Connections: Not on file    Tobacco Counseling Counseling given: Not Answered   Clinical Intake:  Pre-visit preparation completed: Yes  Pain : No/denies pain     Nutritional Status: BMI > 30  Obese Nutritional Risks: None Diabetes: No  How often do you need to have someone help you when you read instructions, pamphlets, or other written materials from your doctor or pharmacy?: 1 - Never What is the last grade level you completed in school?: 42yr college  Diabetic? no  Interpreter Needed?: No  Information entered by :: NAllen LPN   Activities of Daily Living    03/07/2022   12:20 PM  In your present state of health, do you have any difficulty performing the following activities:   Hearing? 0  Vision? 0  Difficulty concentrating or making decisions? 0  Walking or climbing stairs? 1  Dressing or bathing? 0  Doing errands, shopping? 0  Preparing Food and eating ? N  Using the Toilet? N  In the past six months, have you accidently leaked urine? N  Do you have problems with loss of bowel control? N  Managing your Medications? N  Managing your Finances? N  Housekeeping or managing your Housekeeping? N    Patient Care Team: Panosh, WStandley Brooking MD as PCP - General CStanford Breed BDenice Bors MD as PCP - Cardiology (Cardiology) CStanford BreedBDenice Bors MD (Cardiology) BSusa Day MD (Orthopedic Surgery) SMauro Kaufmann RN as Oncology Nurse Navigator MRockwell Germany RN as Oncology Nurse Navigator GNicholas Lose MD as Consulting Physician (Hematology and Oncology)  Indicate any recent Medical Services you may have received from other than [facility] providers in the past year (date may be approximate).     Assessment:   This is a routine wellness examination for ELilas  Hearing/Vision screen Vision Screening - Comments:: No regular eye exams, [facility] Dietary issues and exercise activities discussed: Current Exercise Habits: The patient does not participate in regular exercise at present   Goals Addressed             This Visit's Progress    Patient Stated       03/07/2022, no goals       Depression Screen    03/07/2022   12:20 PM 03/01/2021   11:54 AM 12/20/2020   10:28 AM 11/05/2018    9:24 AM 07/03/2017    1:43 PM  PHQ 2/9 Scores  PHQ - 2 Score 0 0 0 0 0    Fall Risk    03/07/2022   12:19 PM 03/02/2022    8:29 AM 03/01/2021   11:57 AM 07/03/2017    1:43 PM  Fall  Risk   Falls in the past year? 0 0 0 No  Number falls in past yr: 0  0   Injury with Fall? 0  0   Risk for fall due to : Medication side effect  Impaired vision;Impaired balance/gait   Follow up Falls evaluation completed;Education provided;Falls prevention discussed  Falls prevention discussed      FALL RISK PREVENTION PERTAINING TO THE HOME:  Any stairs in or around the home? Yes  If so, are there any without handrails? No  Home free of loose throw rugs in walkways, pet beds, electrical cords, etc? Yes  Adequate lighting in your home to reduce risk of falls? Yes   ASSISTIVE DEVICES UTILIZED TO PREVENT FALLS:  Life alert? No  Use of a cane, walker or w/c? No  Grab bars in the bathroom? No  Shower chair or bench in shower? No  Elevated toilet seat or a handicapped toilet? No   TIMED UP AND GO:  Was the test performed? No .      Cognitive Function:        03/07/2022   12:21 PM 03/01/2021   11:58 AM  6CIT Screen  What Year? 0 points 0 points  What month? 0 points 0 points  What time? 0 points 0 points  Count back from 20 0 points 0 points  Months in reverse 0 points 0 points  Repeat phrase 0 points 0 points  Total Score 0 points 0 points    Immunizations Immunization History  Administered Date(s) Administered   Fluad Quad(high Dose 65+) 07/03/2019, 07/08/2020, 07/12/2021   Influenza Whole 07/20/2009   Influenza, High Dose Seasonal PF 07/03/2017   Influenza,inj,Quad PF,6+ Mos 09/12/2018   Influenza-Unspecified 07/02/2013, 07/13/2014   PFIZER Comirnaty(Gray Top)Covid-19 Tri-Sucrose Vaccine 02/01/2021   PFIZER(Purple Top)SARS-COV-2 Vaccination 11/23/2019, 12/17/2019, 07/17/2020   Pneumococcal Conjugate-13 07/03/2017   Pneumococcal Polysaccharide-23 12/20/2020   Pneumococcal-Unspecified 07/03/2011    TDAP status: Up to date  Flu Vaccine status: Up to date  Pneumococcal vaccine status: Up to date  Covid-19 vaccine status: Completed vaccines  Qualifies for Shingles Vaccine? Yes   Zostavax completed No   Shingrix Completed?: No.    Education has been provided regarding the importance of this vaccine. Patient has been advised to call insurance company to determine out of pocket expense if they have not yet received this vaccine. Advised may also receive  vaccine at local pharmacy or Health Dept. Verbalized acceptance and understanding.  Screening Tests Health Maintenance  Topic Date Due   Zoster Vaccines- Shingrix (1 of 2) Never done   DEXA SCAN  Never done   COVID-19 Vaccine (5 - Booster for Pfizer series) 03/29/2021   TETANUS/TDAP  07/04/2027 (Originally 05/29/1970)   INFLUENZA VACCINE  05/02/2022   MAMMOGRAM  09/28/2023   COLONOSCOPY (Pts 45-10yr Insurance coverage will need to be confirmed)  05/10/2024   Pneumonia Vaccine 70 Years old  Completed   Hepatitis C Screening  Completed   HPV VACCINES  Aged Out    Health Maintenance  Health Maintenance Due  Topic Date Due   Zoster Vaccines- Shingrix (1 of 2) Never done   DEXA SCAN  Never done   COVID-19 Vaccine (5 - Booster for Pfizer series) 03/29/2021    Colorectal cancer screening: Type of screening: Colonoscopy. Completed 05/10/2021. Repeat every 3 years  Mammogram status: Completed 09/27/2021. Repeat every year  Bone Density status: due  Lung Cancer Screening: (Low Dose CT Chest recommended if Age 70-80years, 30 pack-year currently smoking OR  have quit w/in 15years.) does not qualify.   Lung Cancer Screening Referral: no  Additional Screening:  Hepatitis C Screening: does qualify; Completed 01/26/2016  Vision Screening: Recommended annual ophthalmology exams for early detection of glaucoma and other disorders of the eye. Is the patient up to date with their annual eye exam?  No  Who is the provider or what is the name of the office in which the patient attends annual eye exams? [facility] If pt is not established with a provider, would they like to be referred to a provider to establish care? No .   Dental Screening: Recommended annual dental exams for proper oral hygiene  Community Resource Referral / Chronic Care Management: CRR required this visit?  No   CCM required this visit?  No      Plan:     I have personally reviewed and noted the following in the  patient's chart:   Medical and social history Use of alcohol, tobacco or illicit drugs  Current medications and supplements including opioid prescriptions.  Functional ability and status Nutritional status Physical activity Advanced directives List of other physicians Hospitalizations, surgeries, and ER visits in previous 12 months Vitals Screenings to include cognitive, depression, and falls Referrals and appointments  In addition, I have reviewed and discussed with patient certain preventive protocols, quality metrics, and best practice recommendations. A written personalized care plan for preventive services as well as general preventive health recommendations were provided to patient.     Kellie Simmering, LPN   02/05/3148   Nurse Notes: none  Due to this being a virtual visit, the after visit summary with patients personalized plan was offered to patient via mail or my-chart.  Patient would like to access on my-chart

## 2022-03-07 NOTE — Patient Instructions (Signed)
[name] Foster , Thank you for taking time to come for your Medicare Wellness Visit. I appreciate your ongoing commitment to your health goals. Please review the following plan we discussed and let me know if I can assist you in the future.   Screening recommendations/referrals: Colonoscopy: completed 05/10/2021, due 05/10/2024 Mammogram: completed 09/27/2021, due 09/28/2022 Bone Density: due Recommended yearly ophthalmology/optometry visit for glaucoma screening and checkup Recommended yearly dental visit for hygiene and checkup  Vaccinations: Influenza vaccine: due 05/02/2022 Pneumococcal vaccine: completed 12/20/2020 Tdap vaccine: allergy Shingles vaccine: discussed   Covid-19: 02/01/2021, 07/17/2020, 12/17/2019, 11/23/2019  Advanced directives: Advance directive discussed with you today.   Conditions/risks identified: none  Next appointment: Follow up in one year for your annual wellness visit    Preventive Care 65 Years and Older, Female Preventive care refers to lifestyle choices and visits with your health care provider that can promote health and wellness. What does preventive care include? A yearly physical exam. This is also called an annual well check. Dental exams once or twice a year. Routine eye exams. Ask your health care provider how often you should have your eyes checked. Personal lifestyle choices, including: Daily care of your teeth and gums. Regular physical activity. Eating a healthy diet. Avoiding tobacco and drug use. Limiting alcohol use. Practicing safe sex. Taking low-dose aspirin every day. Taking vitamin and mineral supplements as recommended by your health care provider. What happens during an annual well check? The services and screenings done by your health care provider during your annual well check will depend on your age, overall health, lifestyle risk factors, and family history of disease. Counseling  Your health care provider may ask you  questions about your: Alcohol use. Tobacco use. Drug use. Emotional well-being. Home and relationship well-being. Sexual activity. Eating habits. History of falls. Memory and ability to understand (cognition). Work and work Statistician. Reproductive health. Screening  You may have the following tests or measurements: Height, weight, and BMI. Blood pressure. Lipid and cholesterol levels. These may be checked every 5 years, or more frequently if you are over 70 years old. Skin check. Lung cancer screening. You may have this screening every year starting at age 70 if you have a 30-pack-year history of smoking and currently smoke or have quit within the past 15 years. Fecal occult blood test (FOBT) of the stool. You may have this test every year starting at age 70. Flexible sigmoidoscopy or colonoscopy. You may have a sigmoidoscopy every 5 years or a colonoscopy every 10 years starting at age 70. Hepatitis C blood test. Hepatitis B blood test. Sexually transmitted disease (STD) testing. Diabetes screening. This is done by checking your blood sugar (glucose) after you have not eaten for a while (fasting). You may have this done every 1-3 years. Bone density scan. This is done to screen for osteoporosis. You may have this done starting at age 70. Mammogram. This may be done every 1-2 years. Talk to your health care provider about how often you should have regular mammograms. Talk with your health care provider about your test results, treatment options, and if necessary, the need for more tests. Vaccines  Your health care provider may recommend certain vaccines, such as: Influenza vaccine. This is recommended every year. Tetanus, diphtheria, and acellular pertussis (Tdap, Td) vaccine. You may need a Td booster every 10 years. Zoster vaccine. You may need this after age 70. Pneumococcal 13-valent conjugate (PCV13) vaccine. One dose is recommended after age 70. Pneumococcal polysaccharide  (PPSV23) vaccine.  One dose is recommended after age 70. Talk to your health care provider about which screenings and vaccines you need and how often you need them. This information is not intended to replace advice given to you by your health care provider. Make sure you discuss any questions you have with your health care provider. Document Released: 10/15/2015 Document Revised: 06/07/2016 Document Reviewed: 07/20/2015 Elsevier Interactive Patient Education  2017 [place] Prevention in the Home Falls can cause injuries. They can happen to people of all ages. There are many things you can do to make your home safe and to help prevent falls. What can I do on the outside of my home? Regularly fix the edges of walkways and driveways and fix any cracks. Remove anything that might make you trip as you walk through a door, such as a raised step or threshold. Trim any bushes or trees on the path to your home. Use bright outdoor lighting. Clear any walking paths of anything that might make someone trip, such as rocks or tools. Regularly check to see if handrails are loose or broken. Make sure that both sides of any steps have handrails. Any raised decks and porches should have guardrails on the edges. Have any leaves, snow, or ice cleared regularly. Use sand or salt on walking paths during winter. Clean up any spills in your garage right away. This includes oil or grease spills. What can I do in the bathroom? Use night lights. Install grab bars by the toilet and in the tub and shower. Do not use towel bars as grab bars. Use non-skid mats or decals in the tub or shower. If you need to sit down in the shower, use a plastic, non-slip stool. Keep the floor dry. Clean up any water that spills on the floor as soon as it happens. Remove soap buildup in the tub or shower regularly. Attach bath mats securely with double-sided non-slip rug tape. Do not have throw rugs and other things on the  floor that can make you trip. What can I do in the bedroom? Use night lights. Make sure that you have a light by your bed that is easy to reach. Do not use any sheets or blankets that are too big for your bed. They should not hang down onto the floor. Have a firm chair that has side arms. You can use this for support while you get dressed. Do not have throw rugs and other things on the floor that can make you trip. What can I do in the kitchen? Clean up any spills right away. Avoid walking on wet floors. Keep items that you use a lot in easy-to-reach places. If you need to reach something above you, use a strong step stool that has a grab bar. Keep electrical cords out of the way. Do not use floor polish or wax that makes floors slippery. If you must use wax, use non-skid floor wax. Do not have throw rugs and other things on the floor that can make you trip. What can I do with my stairs? Do not leave any items on the stairs. Make sure that there are handrails on both sides of the stairs and use them. Fix handrails that are broken or loose. Make sure that handrails are as long as the stairways. Check any carpeting to make sure that it is firmly attached to the stairs. Fix any carpet that is loose or worn. Avoid having throw rugs at the top or bottom of the  stairs. If you do have throw rugs, attach them to the floor with carpet tape. Make sure that you have a light switch at the top of the stairs and the bottom of the stairs. If you do not have them, ask someone to add them for you. What else can I do to help prevent falls? Wear shoes that: Do not have high heels. Have rubber bottoms. Are comfortable and fit you well. Are closed at the toe. Do not wear sandals. If you use a stepladder: Make sure that it is fully opened. Do not climb a closed stepladder. Make sure that both sides of the stepladder are locked into place. Ask someone to hold it for you, if possible. Clearly mark and make  sure that you can see: Any grab bars or handrails. First and last steps. Where the edge of each step is. Use tools that help you move around (mobility aids) if they are needed. These include: Canes. Walkers. Scooters. Crutches. Turn on the lights when you go into a dark area. Replace any light bulbs as soon as they burn out. Set up your furniture so you have a clear path. Avoid moving your furniture around. If any of your floors are uneven, fix them. If there are any pets around you, be aware of where they are. Review your medicines with your doctor. Some medicines can make you feel dizzy. This can increase your chance of falling. Ask your doctor what other things that you can do to help prevent falls. This information is not intended to replace advice given to you by your health care provider. Make sure you discuss any questions you have with your health care provider. Document Released: 07/15/2009 Document Revised: 02/24/2016 Document Reviewed: 10/23/2014 Elsevier Interactive Patient Education  2017 [employer].

## 2022-03-08 ENCOUNTER — Telehealth: Payer: Self-pay

## 2022-03-08 NOTE — Telephone Encounter (Signed)
   Telephone encounter was:  Unsuccessful.  03/08/2022 Name: [name] Foster MRN: [id number] DOB: December 07, 1950  Unsuccessful outbound call made today to assist with:  Food Insecurity  Outreach Attempt:  2nd Attempt  A HIPAA compliant voice message was left requesting a return call.  Instructed patient to call back at  earliest convenience.  [place], Care Management  [phone] [address], [place], [place] [address] Phone: [phone] Email: [email or web address]

## 2022-03-10 ENCOUNTER — Telehealth: Payer: Self-pay

## 2022-03-10 NOTE — Telephone Encounter (Signed)
   Telephone encounter was:  Successful.  03/10/2022 Name: Rosali Augello MRN: [id number] DOB: 08-15-1951  Shanayah Kaffenberger is a 71 y.o. year old female who is a primary care patient of Panosh, Standley Brooking, MD . The community resource team was consulted for assistance with [place] guide performed the following interventions: Patient provided with information about care guide support team and interviewed to confirm resource needs.Patient requested I email resouces for food banks and financial assistance.   Follow Up Plan:  No further follow up planned at this time. The patient has been provided with needed resources.    [place], Care Management  [phone] [address], [place], [place] [address] Phone: [phone] Email: [email or web address]

## 2022-03-14 ENCOUNTER — Telehealth: Payer: Self-pay

## 2022-03-14 ENCOUNTER — Ambulatory Visit (INDEPENDENT_AMBULATORY_CARE_PROVIDER_SITE_OTHER): Payer: Medicare Other

## 2022-03-14 DIAGNOSIS — Z7901 Long term (current) use of anticoagulants: Secondary | ICD-10-CM

## 2022-03-14 LAB — POCT INR: INR: 3 (ref 2.0–3.0)

## 2022-03-14 NOTE — Progress Notes (Signed)
Continue 1 tablet daily except take 1/2 tablet on Mondays. Recheck in 7 weeks.

## 2022-03-14 NOTE — Telephone Encounter (Signed)
Pt in today for coumadin clinic. Requested she return for coumadin clinic in 6 weeks but pt will be out of town the entire month of July. She has apt with PCP on 8/1, Tuesday, and requested to have her INR checked at that time. There is no coumadin clinic on Tuesdays at [facility] so sending msg to PCP requesting POCT INR or lab INR be completed at that visit. Made note in scheduling note.

## 2022-03-14 NOTE — Patient Instructions (Addendum)
Pre visit review using our clinic review tool, if applicable. No additional management support is needed unless otherwise documented below in the visit note.  Continue 1 tablet daily except take 1/2 tablet on Mondays. Recheck in 7 weeks.

## 2022-03-15 ENCOUNTER — Encounter: Payer: Self-pay | Admitting: Podiatry

## 2022-03-15 ENCOUNTER — Ambulatory Visit (INDEPENDENT_AMBULATORY_CARE_PROVIDER_SITE_OTHER): Payer: Medicare Other | Admitting: Podiatry

## 2022-03-15 DIAGNOSIS — M79674 Pain in right toe(s): Secondary | ICD-10-CM | POA: Diagnosis not present

## 2022-03-15 DIAGNOSIS — D689 Coagulation defect, unspecified: Secondary | ICD-10-CM

## 2022-03-15 DIAGNOSIS — G629 Polyneuropathy, unspecified: Secondary | ICD-10-CM

## 2022-03-15 DIAGNOSIS — B351 Tinea unguium: Secondary | ICD-10-CM

## 2022-03-15 DIAGNOSIS — M79675 Pain in left toe(s): Secondary | ICD-10-CM | POA: Diagnosis not present

## 2022-03-17 ENCOUNTER — Ambulatory Visit: Payer: Medicare Other | Admitting: Podiatry

## 2022-03-20 NOTE — Progress Notes (Signed)
  Subjective:  Patient ID: [name] Foster, female    DOB: Mar 30, 1951,  MRN: [id number]  [name] Foster presents to clinic today for at risk foot care with h/o neuropathy, clotting disorder and painful elongated mycotic toenails 1-5 bilaterally which are tender when wearing enclosed shoe gear. Pain is relieved with periodic professional debridement.   New problem(s): None.   PCP is Panosh, Standley Brooking, MD , and last visit was December 27, 2021.  Allergies  Allergen Reactions   Penicillins Rash    Did it involve swelling of the face/tongue/throat, SOB, or low BP? Unknown Did it involve sudden or severe rash/hives, skin peeling, or any reaction on the inside of your mouth or nose? Yes Did you need to seek medical attention at a hospital or doctor's office? Yes When did it last happen?      in her 2s If all above answers are "NO", may proceed with cephalosporin use.  Did it involve swelling of the face/tongue/throat, SOB, or low BP? Unknown Did it involve sudden or severe rash/hives, skin peeling, or any reaction on the inside of your mouth or nose? Yes Did you need to seek medical attention at a hospital or doctor's office? Yes When did it last happen?      in her 44s If all above answers are "NO", may proceed with cephalosporin use. Did it involve swelling of the face/tongue/throat, SOB, or low BP? Unknown Did it involve sudden or severe rash/hives, skin peeling, or any reaction on the inside of your mouth or nose? Yes Did you need to seek medical attention at a hospital or doctor's office? Yes When did it last happen?      in her 78s If all above answers are "NO", may proceed with cephalosporin use.   Tetanus Toxoid Rash    Caused cellulitis    Aspirin Rash    Review of Systems: Negative except as noted in the HPI.  Objective:  Constitutional Pt is a pleasant 71 y.o. African American female obese in NAD. AAO x 3.   Vascular Capillary fill time to digits <3 seconds b/l lower  extremities. Palpable DP pulse(s) b/l lower extremities Palpable PT pulse(s) b/l lower extremities Pedal hair sparse. Lower extremity skin temperature gradient within normal limits. No pain with calf compression b/l.  Neurologic Protective sensation diminished with 10g monofilament b/l.  Dermatologic Toenails 1-5 b/l elongated, discolored, dystrophic, thickened, crumbly with subungual debris and tenderness to dorsal palpation. Incurvated nailplate medial border left 2nd toe. No erythema, no edema, no drainage, no fluctuance.  Orthopedic: Normal muscle strength 5/5 to all lower extremity muscle groups bilaterally. No pain crepitus or joint limitation noted with ROM b/l. Pes planus deformity noted b/l.    Radiographs: None Assessment/Plan: 1. Pain due to onychomycosis of toenails of both feet   2. Blood clotting disorder ([place])   3. Neuropathy     -Patient was evaluated and treated. All patient's and/or POA's questions/concerns answered on today's visit. -Mycotic toenails 1-5 bilaterally were debrided in length and girth with sterile nail nippers and dremel without incident. -Offending nail border debrided and curretaged L 2nd toe utilizing sterile nail nipper and currette. Border cleansed with alcohol and triple antibiotic applied. No further treatment required by patient/caregiver. Call office if there are any concerns. -Patient/POA to call should there be question/concern in the interim.   Return in about 3 months (around 06/15/2022).  Marzetta Board, DPM

## 2022-03-25 ENCOUNTER — Other Ambulatory Visit: Payer: Self-pay | Admitting: Nurse Practitioner

## 2022-03-28 ENCOUNTER — Emergency Department
Admission: EM | Admit: 2022-03-28 | Discharge: 2022-03-28 | Disposition: A | Discharge status: Home / Self Care | Payer: Medicare Other | Attending: Emergency Medicine | Admitting: Emergency Medicine

## 2022-03-28 ENCOUNTER — Emergency Department: Payer: Medicare Other

## 2022-03-28 ENCOUNTER — Other Ambulatory Visit: Payer: Self-pay

## 2022-03-28 DIAGNOSIS — M7061 Trochanteric bursitis, right hip: Secondary | ICD-10-CM | POA: Insufficient documentation

## 2022-03-28 DIAGNOSIS — M47816 Spondylosis without myelopathy or radiculopathy, lumbar region: Secondary | ICD-10-CM | POA: Diagnosis not present

## 2022-03-28 DIAGNOSIS — N189 Chronic kidney disease, unspecified: Secondary | ICD-10-CM | POA: Insufficient documentation

## 2022-03-28 DIAGNOSIS — M7071 Other bursitis of hip, right hip: Secondary | ICD-10-CM | POA: Diagnosis not present

## 2022-03-28 DIAGNOSIS — M25551 Pain in right hip: Secondary | ICD-10-CM | POA: Diagnosis not present

## 2022-03-28 DIAGNOSIS — Y9389 Activity, other specified: Secondary | ICD-10-CM | POA: Diagnosis not present

## 2022-03-28 MED ORDER — PREDNISONE 10 MG PO TABS: 10.0000 mg | ORAL_TABLET | ORAL | 0 refills | Status: DC

## 2022-03-28 MED ORDER — PREDNISONE 20 MG PO TABS
60.0000 mg | ORAL_TABLET | Freq: Once | ORAL | Status: AC
  Administered 2022-03-28: 60 mg via ORAL
  Filled 2022-03-28: qty 3

## 2022-03-28 NOTE — ED Provider Notes (Signed)
[facility] Provider Note  Patient Contact: 6:05 PM (approximate)   History   Hip Pain   HPI  [name] Foster is a 71 y.o. female who presents to the emergency department complaining of nontraumatic right hip pain.  Patient is having pain along the lateral aspect of her hip.  She states that she was laying on her bed last night, on her abdomen.  She rolled over, felt a pulling sensation in her hip, stood up and felt a sharp pain in the lateral hip.  She states that with no movement, and at rest she has minimal pain or symptoms.  With any weightbearing she has a sharp pain along the lateral aspect of her hip.  She has good range of motion when there is no weight on the hip.  No knee or back pain.  No history of previous hip issues.  Again no trauma.  No other complaints at this time.     Physical Exam   Triage Vital Signs: ED Triage Vitals  Enc Vitals Group     BP 03/28/22 1724 (!) 141/79     Pulse Rate 03/28/22 1724 65     Resp 03/28/22 1724 17     Temp 03/28/22 1724 98.7 F (37.1 C)     Temp Source 03/28/22 1724 Oral     SpO2 03/28/22 1724 94 %     Weight --      Height --      Head Circumference --      Peak Flow --      Pain Score 03/28/22 1722 8     Pain Loc --      Pain Edu? --      Excl. in GC? --     Most recent vital signs: Vitals:   03/28/22 1724  BP: (!) 141/79  Pulse: 65  Resp: 17  Temp: 98.7 F (37.1 C)  SpO2: 94%     General: Alert and in no acute distress.  Cardiovascular:  Good peripheral perfusion Respiratory: Normal respiratory effort without tachypnea or retractions. Lungs CTAB.  Gastrointestinal: Bowel sounds 4 quadrants. Soft and nontender to palpation. No guarding or rigidity. No palpable masses. No distention. No CVA tenderness. Musculoskeletal: Full range of motion to all extremities.  No acute findings to the lumbar spine.  No tenderness over the SI joint or sciatic notch.  Patient is tender to palpation  along the lateral hip over the greater trochanter region with no palpable abnormality.  No tenderness of the hip.  Good range of motion.  No shortening or rotation of the right lower extremity.  Dorsalis pedis pulses sensation intact distally. Neurologic:  No gross focal neurologic deficits are appreciated.  Skin:   No rash noted Other:   ED Results / Procedures / Treatments   Labs (all labs ordered are listed, but only abnormal results are displayed) Labs Reviewed - No data to display   EKG     RADIOLOGY  I personally viewed, evaluated, and interpreted these images as part of my medical decision making, as well as reviewing the written report by the radiologist.  ED Provider Interpretation: No acute traumatic findings to the right hip.  Specifically no fracture, dislocation.  Mild degenerative changes appreciated.  DG Hip Unilat  With Pelvis 2-3 Views Right  Result Date: 03/28/2022 CLINICAL DATA:  Hip pain EXAM: DG HIP (WITH OR WITHOUT PELVIS) 3V RIGHT COMPARISON:  None Available. FINDINGS: There is no evidence of hip fracture or dislocation. Degenerative  changes of the partially visualized lumbar spine. Soft tissues are unremarkable. IMPRESSION: No acute osseous abnormality. Electronically Signed   By: Allegra Lai M.D.   On: 03/28/2022 18:10    PROCEDURES:  Critical Care performed: No  Procedures   MEDICATIONS ORDERED IN ED: Medications  predniSONE (DELTASONE) tablet 60 mg (has no administration in time range)     IMPRESSION / MDM / ASSESSMENT AND PLAN / ED COURSE  I reviewed the triage vital signs and the nursing notes.                              Differential diagnosis includes, but is not limited to, strain, hip fracture, bursitis, hip dislocation, intertrochanteric fracture  Patient's presentation is most consistent with acute illness / injury with system symptoms.   Patient's diagnosis is consistent with bursitis.  Patient presents to the ED complaining  of right lateral hip pain.  Nontraumatic in nature.  Tender over the greater trochanter region.  Imaging reveals no acute findings such as fracture or dislocation.  Patient will be treated symptomatically with anti-inflammatory.  She she does have some chronic kidney disease with GFR in the 30s and creatinines anywhere from 1.5-2.  As such I will place the patient on a course of steroids.  Follow-up with orthopedics if symptoms or not improving with conservative therapy.   Patient is given ED precautions to return to the ED for any worsening or new symptoms.        FINAL CLINICAL IMPRESSION(S) / ED DIAGNOSES   Final diagnoses:  Trochanteric bursitis of right hip     Rx / DC Orders   ED Discharge Orders          Ordered    predniSONE (DELTASONE) 10 MG tablet  As directed       Note to Pharmacy: Take on a pattern of 6, 6, 5, 5, 4, 4, 3, 3, 2, 2, 1, 1   03/28/22 1828             Note:  This document was prepared using Dragon voice recognition software and may include unintentional dictation errors.   Racheal Patches, PA-C 03/28/22 1828    Merwyn Katos, MD 03/28/22 [phone]

## 2022-05-01 NOTE — Progress Notes (Unsigned)
No chief complaint on file.   HPI: Patient  [name] Foster  71 y.o. comes in today for Preventive Health Care visit    Ed bursitis hip JUne Hochrein sees for cards  and bp meds  Coumadin Flonase  Albuterol  Health Maintenance  Topic Date Due   Zoster Vaccines- Shingrix (1 of 2) Never done   DEXA SCAN  Never done   COVID-19 Vaccine (5 - Booster for Pfizer series) 03/29/2021   TETANUS/TDAP  07/04/2027 (Originally 05/29/1971)   INFLUENZA VACCINE  05/02/2022   MAMMOGRAM  09/28/2023   COLONOSCOPY (Pts 45-84yr Insurance coverage will need to be confirmed)  05/10/2024   Pneumonia Vaccine 70 Years old  Completed   Hepatitis C Screening  Completed   HPV VACCINES  Aged Out   Health Maintenance Review LIFESTYLE:  Exercise:   Tobacco/ETS: Alcohol:  Sugar beverages: Sleep: Drug use: no HH of  Work:    ROS:  GEN/ HEENT: No fever, significant weight changes sweats headaches vision problems hearing changes, CV/ PULM; No chest pain shortness of breath cough, syncope,edema  change in exercise tolerance. GI /GU: No adominal pain, vomiting, change in bowel habits. No blood in the stool. No significant GU symptoms. SKIN/HEME: ,no acute skin rashes suspicious lesions or bleeding. No lymphadenopathy, nodules, masses.  NEURO/ PSYCH:  No neurologic signs such as weakness numbness. No depression anxiety. IMM/ Allergy: No unusual infections.  Allergy .   REST of 12 system review negative except as per HPI   Past Medical History:  Diagnosis Date   Allergic rhinitis    Anemia    Aortic valve disorders    Arthritis    Asthma    Cancer ([place]    Breast Cancer   CHF (congestive heart failure) ([facility]    Coronary artery disease    Family history of adverse reaction to anesthesia    difficulty waking mother up after surgery   Family history of breast cancer    Family history of cervical cancer    Family history of colon cancer    Family history of throat cancer    Heart murmur     Hyperlipidemia    Hypertension    Long term (current) use of anticoagulants    Other primary cardiomyopathies    Peripheral vascular disease ([facility]    right leg   Personal history of chemotherapy    Personal history of colonic polyps    Personal history of radiation therapy    Personal history of venous thrombosis and embolism     Past Surgical History:  Procedure Laterality Date   ABDOMINAL HYSTERECTOMY     BREAST BIOPSY Left 07/2019   CARDIAC CATHETERIZATION  2004   DILATION AND CURETTAGE OF UTERUS     several   MASTECTOMY MODIFIED RADICAL Left 03/09/2020   Left Modified Radical Mastectomy (left mastectomy with axillary lymph node dissection)    MASTECTOMY MODIFIED RADICAL Left 03/09/2020   Procedure: LEFT MODIFIED RADICAL MASTECTOMY;  Surgeon: BStark Klein MD;  [facility]  Service: General;  Laterality: Left;  GEN AND PECTORAL BLOCK   PORT-A-CATH REMOVAL N/A 08/31/2020   Procedure: REMOVAL PORT-A-CATH;  Surgeon: BStark Klein MD;  [facility]  Service: General;  Laterality: N/A;   PORTACATH PLACEMENT Left 08/18/2019   Procedure: INSERTION PORT-A-CATH;  Surgeon: BStark Klein MD;  [facility]  Service: General;  Laterality: Left;   Rt knee arthoscopic     SHOULDER ARTHROSCOPY W/ ROTATOR CUFF REPAIR Right 07/2016   TEE  WITHOUT CARDIOVERSION N/A 02/21/2013   Procedure: TRANSESOPHAGEAL ECHOCARDIOGRAM (TEE);  Surgeon: Lelon Perla, MD;  Location[facility] ENDOSCOPY;  Service: Cardiovascular;  Laterality: N/A;    Family History  Problem Relation Age of Onset   Alcohol abuse Other    Depression Other    Hyperlipidemia Other    Hypertension Other    Kidney disease Other    Colon cancer Mother        dx late 36s   Cervical cancer Maternal Grandmother    Stroke Paternal Grandmother    Breast cancer Maternal Aunt        dx 47s   Breast cancer Cousin        dx 58s    Social History   Socioeconomic History   Marital status: Legally Separated    Spouse  name: Not on file   Number of children: Not on file   Years of education: Not on file   Highest education level: Not on file  Occupational History    Comment: retired  Tobacco Use   Smoking status: Former    Packs/day: 0.25    Types: Cigarettes    Quit date: 07/02/2019    Years since quitting: 2.8   Smokeless tobacco: Never  Vaping Use   Vaping Use: Never used  Substance and Sexual Activity   Alcohol use: Yes    Comment: rare occasion   Drug use: No   Sexual activity: Not on file    Comment: Hysterectomy  Other Topics Concern   Not on file  Social History Narrative   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    Divorced   Regular exercise- no   5 hours sleep    Lives with a son age 33    No pets         Social Determinants of Health   Financial Resource Strain: Medium Risk (03/07/2022)   Overall Financial Resource Strain (CARDIA)    Difficulty of Paying Living Expenses: Somewhat hard  Food Insecurity: No Food Insecurity (03/07/2022)   Hunger Vital Sign    Worried About Running Out of Food in the Last Year: Never true    Ran Out of Food in the Last Year: Never true  Transportation Needs: No Transportation Needs (03/07/2022)   PRAPARE - Hydrologist (Medical): No    Lack of Transportation (Non-Medical): No  Recent Concern: Transportation Needs - Unmet Transportation Needs (01/26/2022)   PRAPARE - Hydrologist (Medical): Yes    Lack of Transportation (Non-Medical): No  Physical Activity: Inactive (03/07/2022)   Exercise Vital Sign    Days of Exercise per Week: 0 days    Minutes of Exercise per Session: 0 min  Stress: No Stress Concern Present (03/07/2022)   [place]    Feeling of Stress : Only a little  Social Connections: Moderately Isolated (03/01/2021)   Social Connection and Isolation Panel [NHANES]    Frequency of Communication with Friends and  Family: Three times a week    Frequency of Social Gatherings with Friends and Family: Once a week    Attends Religious Services: 1 to 4 times per year    Active Member of [employer] or Organizations: No    Attends Archivist Meetings: Never    Marital Status: Separated    Outpatient Medications Prior to Visit  Medication Sig Dispense Refill   Albuterol Sulfate (PROAIR RESPICLICK) 660 (90  Base) MCG/ACT AEPB Inhale 2 puffs into the lungs every 6 (six) hours as needed. 2 each 1   amLODipine (NORVASC) 10 MG tablet TAKE 1 TABLET BY MOUTH  DAILY 90 tablet 3   atorvastatin (LIPITOR) 20 MG tablet TAKE 1 TABLET BY MOUTH  DAILY 90 tablet 3   Carboxymethylcellul-Glycerin (CLEAR EYES FOR DRY EYES) 1-0.25 % SOLN Place 1 drop into both eyes 3 (three) times daily as needed (dry eyes).      Cholecalciferol (VITAMIN D) 50 MCG (2000 UT) tablet Take 2,000 Units by mouth daily.     fexofenadine (ALLEGRA) 180 MG tablet Take 180 mg by mouth daily as needed for allergies.      fluticasone (FLONASE) 50 MCG/ACT nasal spray USE 2 SPRAYS IN EACH NOSTRIL DAILY. PT NEEDS TO SCHEDULE A FOLLOW UP APPT BEFORE NEXT REFILL. (Patient taking differently: Place 2 sprays into both nostrils daily.) 16 g 0   furosemide (LASIX) 40 MG tablet Take 0.5 tablets (20 mg total) by mouth daily. 90 tablet 3   hydrALAZINE (APRESOLINE) 50 MG tablet TAKE 1 TABLET BY MOUTH 3  TIMES DAILY 270 tablet 3   metoprolol tartrate (LOPRESSOR) 50 MG tablet TAKE 1 TABLET BY MOUTH  TWICE DAILY 180 tablet 3   potassium chloride SA (KLOR-CON) 20 MEQ tablet Take 20 mEq by mouth daily. (Patient not taking: Reported on 03/01/2021)     predniSONE (DELTASONE) 10 MG tablet Take 1 tablet (10 mg total) by mouth as directed. 42 tablet 0   sodium chloride (OCEAN) 0.65 % nasal spray Place into the nose.     warfarin (COUMADIN) 5 MG tablet TAKE 1 TABLET BY MOUTH DAILY EXCEPT TAKE 1 AND 1/2 TABLETS BY MOUTH ON MONDAYS OR AS  DIRECTED BY ANTICOAGULATION  CLINIC 102  tablet 2   No facility-administered medications prior to visit.     EXAM:  LMP  (LMP Unknown)   There is no height or weight on file to calculate BMI. Wt Readings from Last 3 Encounters:  03/28/22 229 lb 15 oz (104.3 kg)  03/07/22 230 lb (104.3 kg)  11/16/21 233 lb 8 oz (105.9 kg)    Physical Exam: Vital signs reviewed ERD:EYCX is a well-developed well-nourished alert cooperative    who appearsr stated age in no acute distress.  HEENT: normocephalic atraumatic , Eyes: PERRL EOM's full, conjunctiva clear, Nares: paten,t no deformity discharge or tenderness., Ears: no deformity EAC's clear TMs with normal landmarks. Mouth: clear OP, no lesions, edema.  Moist mucous membranes. Dentition in adequate repair. NECK: supple without masses, thyromegaly or bruits. CHEST/PULM:  Clear to auscultation and percussion breath sounds equal no wheeze , rales or rhonchi. No chest wall deformities or tenderness. Breast: normal by inspection . No dimpling, discharge, masses, tenderness or discharge . CV: PMI is nondisplaced, S1 S2 no gallops, murmurs, rubs. Peripheral pulses are full without delay.No JVD .  ABDOMEN: Bowel sounds normal nontender  No guard or rebound, no hepato splenomegal no CVA tenderness.  No hernia. Extremtities:  No clubbing cyanosis or edema, no acute joint swelling or redness no focal atrophy NEURO:  Oriented x3, cranial nerves 3-12 appear to be intact, no obvious focal weakness,gait within normal limits no abnormal reflexes or asymmetrical SKIN: No acute rashes normal turgor, color, no bruising or petechiae. PSYCH: Oriented, good eye contact, no obvious depression anxiety, cognition and judgment appear normal. LN: no cervical axillary inguinal adenopathy  Lab Results  Component Value Date   WBC 5.9 12/20/2020   HGB 12.7 12/20/2020  HCT 38.0 12/20/2020   PLT 230.0 12/20/2020   GLUCOSE 74 06/15/2021   CHOL 171 12/20/2020   TRIG 116.0 12/20/2020   HDL 54.60 12/20/2020    LDLDIRECT 175.9 07/01/2007   LDLCALC 94 12/20/2020   ALT 13 06/15/2021   AST 15 06/15/2021   NA 138 06/15/2021   K 3.5 06/15/2021   CL 101 06/15/2021   CREATININE 1.55 (H) 06/15/2021   BUN 17 06/15/2021   CO2 27 06/15/2021   TSH 2.52 12/20/2020   INR 3.0 03/14/2022   HGBA1C 5.8 12/20/2020    BP Readings from Last 3 Encounters:  03/28/22 (!) 141/79  11/16/21 (!) 156/66  10/18/21 (!) 146/90    Lab reviewed with patient   ASSESSMENT AND PLAN:  Discussed the following assessment and plan:    ICD-10-CM   1. Visit for preventive health examination  Z00.00     2. Stage 3 chronic kidney disease, unspecified whether stage 3a or 3b CKD (HCC)  N18.30     3. Hypertensive heart disease with heart failure (HCC)  I11.0      No follow-ups on file.  Patient Care Team: Avyn Coate, Standley Brooking, MD as PCP - General Stanford Breed Denice Bors, MD as PCP - Cardiology (Cardiology) Stanford Breed Denice Bors, MD (Cardiology) Susa Day, MD (Orthopedic Surgery) Mauro Kaufmann, RN as Oncology Nurse Navigator Rockwell Germany, RN as Oncology Nurse Navigator Nicholas Lose, MD as Consulting Physician (Hematology and Oncology) There are no Patient Instructions on file for this visit.  Standley Brooking. Maymie Brunke M.D.

## 2022-05-02 ENCOUNTER — Ambulatory Visit (INDEPENDENT_AMBULATORY_CARE_PROVIDER_SITE_OTHER): Payer: Medicare Other | Admitting: Internal Medicine

## 2022-05-02 ENCOUNTER — Encounter: Payer: Self-pay | Admitting: Internal Medicine

## 2022-05-02 VITALS — BP 126/70 | HR 62 | Temp 98.5°F | Ht 66.0 in | Wt 228.4 lb

## 2022-05-02 DIAGNOSIS — C773 Secondary and unspecified malignant neoplasm of axilla and upper limb lymph nodes: Secondary | ICD-10-CM

## 2022-05-02 DIAGNOSIS — I43 Cardiomyopathy in diseases classified elsewhere: Secondary | ICD-10-CM | POA: Diagnosis not present

## 2022-05-02 DIAGNOSIS — Z79899 Other long term (current) drug therapy: Secondary | ICD-10-CM

## 2022-05-02 DIAGNOSIS — Z0000 Encounter for general adult medical examination without abnormal findings: Secondary | ICD-10-CM

## 2022-05-02 DIAGNOSIS — C50912 Malignant neoplasm of unspecified site of left female breast: Secondary | ICD-10-CM

## 2022-05-02 DIAGNOSIS — I890 Lymphedema, not elsewhere classified: Secondary | ICD-10-CM

## 2022-05-02 DIAGNOSIS — I110 Hypertensive heart disease with heart failure: Secondary | ICD-10-CM | POA: Diagnosis not present

## 2022-05-02 DIAGNOSIS — I119 Hypertensive heart disease without heart failure: Secondary | ICD-10-CM

## 2022-05-02 DIAGNOSIS — M25551 Pain in right hip: Secondary | ICD-10-CM

## 2022-05-02 DIAGNOSIS — E785 Hyperlipidemia, unspecified: Secondary | ICD-10-CM | POA: Diagnosis not present

## 2022-05-02 DIAGNOSIS — N183 Chronic kidney disease, stage 3 unspecified: Secondary | ICD-10-CM | POA: Diagnosis not present

## 2022-05-02 DIAGNOSIS — Z7901 Long term (current) use of anticoagulants: Secondary | ICD-10-CM | POA: Diagnosis not present

## 2022-05-02 DIAGNOSIS — I509 Heart failure, unspecified: Secondary | ICD-10-CM

## 2022-05-02 LAB — BASIC METABOLIC PANEL
BUN: 14 mg/dL (ref 6–23)
CO2: 30 mEq/L (ref 19–32)
Calcium: 9.3 mg/dL (ref 8.4–10.5)
Chloride: 101 mEq/L (ref 96–112)
Creatinine, Ser: 1.74 mg/dL — ABNORMAL HIGH (ref 0.40–1.20)
GFR: 29.48 mL/min — ABNORMAL LOW (ref >60.00)
Glucose, Bld: 96 mg/dL (ref 70–99)
Potassium: 3.5 mEq/L (ref 3.5–5.1)
Sodium: 140 mEq/L (ref 135–145)

## 2022-05-02 LAB — CBC WITH DIFFERENTIAL/PLATELET
Basophils Absolute: 0 10*3/uL (ref 0.0–0.1)
Basophils Relative: 0.5 % (ref 0.0–3.0)
Eosinophils Absolute: 0.1 10*3/uL (ref 0.0–0.7)
Eosinophils Relative: 1.1 % (ref 0.0–5.0)
HCT: 37.9 % (ref 36.0–46.0)
Hemoglobin: 12.2 g/dL (ref 12.0–15.0)
Lymphocytes Relative: 36.9 % (ref 12.0–46.0)
Lymphs Abs: 1.9 10*3/uL (ref 0.7–4.0)
MCHC: 32.2 g/dL (ref 30.0–36.0)
MCV: 87.8 fl (ref 78.0–100.0)
Monocytes Absolute: 0.6 10*3/uL (ref 0.1–1.0)
Monocytes Relative: 10.8 % (ref 3.0–12.0)
Neutro Abs: 2.7 10*3/uL (ref 1.4–7.7)
Neutrophils Relative %: 50.7 % (ref 43.0–77.0)
Platelets: 243 10*3/uL (ref 150.0–400.0)
RBC: 4.31 Mil/uL (ref 3.87–5.11)
RDW: 14.5 % (ref 11.5–15.5)
WBC: 5.3 10*3/uL (ref 4.0–10.5)

## 2022-05-02 LAB — HEMOGLOBIN A1C: Hgb A1c MFr Bld: 7 % — ABNORMAL HIGH (ref 4.6–6.5)

## 2022-05-02 LAB — LIPID PANEL
Cholesterol: 149 mg/dL (ref 0–200)
HDL: 42.1 mg/dL (ref >39.00)
LDL Cholesterol: 82 mg/dL (ref 0–99)
NonHDL: 106.86
Total CHOL/HDL Ratio: 4
Triglycerides: 122 mg/dL (ref 0.0–149.0)
VLDL: 24.4 mg/dL (ref 0.0–40.0)

## 2022-05-02 LAB — HEPATIC FUNCTION PANEL
ALT: 20 U/L (ref 0–35)
AST: 25 U/L (ref 0–37)
Albumin: 4.1 g/dL (ref 3.5–5.2)
Alkaline Phosphatase: 124 U/L — ABNORMAL HIGH (ref 39–117)
Bilirubin, Direct: 0.2 mg/dL (ref 0.0–0.3)
Total Bilirubin: 0.4 mg/dL (ref 0.2–1.2)
Total Protein: 7.3 g/dL (ref 6.0–8.3)

## 2022-05-02 LAB — TSH: TSH: 2.15 u[IU]/mL (ref 0.35–5.50)

## 2022-05-02 LAB — POCT INR: INR: 2.4 (ref 2.0–3.0)

## 2022-05-02 LAB — PROTIME-INR
INR: 2.4 ratio — ABNORMAL HIGH (ref 0.8–1.0)
Prothrombin Time: 25.1 s — ABNORMAL HIGH (ref 9.6–13.1)

## 2022-05-02 NOTE — Patient Instructions (Signed)
Good to see  you today . Make appt  [facility] ,your team ,about hip if ongoing recurrence  .  Lab today .will forward to  Dr Percival Spanish Healthy weight loss can help your joints no matter the cause.  Will

## 2022-05-03 ENCOUNTER — Ambulatory Visit (INDEPENDENT_AMBULATORY_CARE_PROVIDER_SITE_OTHER): Payer: Medicare Other

## 2022-05-03 DIAGNOSIS — Z7901 Long term (current) use of anticoagulants: Secondary | ICD-10-CM

## 2022-05-03 NOTE — Progress Notes (Signed)
Inr in range  A1c now in diabetic range...; Kidney function about the same  Forwarding info to [place]  and  [place] We need to address the blood sugar issue . Make  either a virtual visit  or in person  next week to discuss .

## 2022-05-03 NOTE — Progress Notes (Signed)
Continue 1 tablet daily except take 1/2 tablet on Mondays. Recheck in 6 weeks

## 2022-05-03 NOTE — Progress Notes (Signed)
BC ,Do you think she is a candidate for farxiga or jardiance even with borderline gfr since now her a1c is 7 ?  Thanks [facility]

## 2022-05-03 NOTE — Patient Instructions (Addendum)
Pre visit review using our clinic review tool, if applicable. No additional management support is needed unless otherwise documented below in the visit note.  Continue 1 tablet daily except take 1/2 tablet on Mondays. Recheck in 6 weeks

## 2022-05-09 ENCOUNTER — Encounter: Payer: Self-pay | Admitting: Internal Medicine

## 2022-05-09 ENCOUNTER — Telehealth (INDEPENDENT_AMBULATORY_CARE_PROVIDER_SITE_OTHER): Payer: Medicare Other | Admitting: Internal Medicine

## 2022-05-09 DIAGNOSIS — Z79899 Other long term (current) drug therapy: Secondary | ICD-10-CM | POA: Diagnosis not present

## 2022-05-09 DIAGNOSIS — E1165 Type 2 diabetes mellitus with hyperglycemia: Secondary | ICD-10-CM | POA: Diagnosis not present

## 2022-05-09 DIAGNOSIS — N183 Chronic kidney disease, stage 3 unspecified: Secondary | ICD-10-CM

## 2022-05-09 DIAGNOSIS — E559 Vitamin D deficiency, unspecified: Secondary | ICD-10-CM | POA: Diagnosis not present

## 2022-05-09 DIAGNOSIS — R748 Abnormal levels of other serum enzymes: Secondary | ICD-10-CM | POA: Diagnosis not present

## 2022-05-09 NOTE — Progress Notes (Addendum)
Virtual Visit via Video Note  I connected with [name] Foster on 05/09/22 at 10:00 AM EDT by a video enabled telemedicine application and verified that I am speaking with the correct person using two identifiers. Location patient: home Location provider:work office Persons participating in the virtual visit: patient, provider  Patient aware  of the limitations of evaluation and management by telemedicine and  availability of in person appointments. and agreed to proceed.   HPI: [name] Foster presents for video visit  fu labs noted related to blood sugar. She has a strong family history diabetes in her parents. She was eating a lot of candy on her vacation but had stopped and perhaps more bread.  Would like to do dietary lifestyle intervention to control the blood sugar at this time before adding medications.  She is taking 2000 international units of vitamin D per day. ROS: See pertinent positives and negatives per HPI.  Past Medical History:  Diagnosis Date   Allergic rhinitis    Anemia    Aortic valve disorders    Arthritis    Asthma    Cancer ([place])    Breast Cancer   CHF (congestive heart failure) ([place])    Coronary artery disease    Family history of adverse reaction to anesthesia    difficulty waking mother up after surgery   Family history of breast cancer    Family history of cervical cancer    Family history of colon cancer    Family history of throat cancer    Heart murmur    Hyperlipidemia    Hypertension    Long term (current) use of anticoagulants    Other primary cardiomyopathies    Peripheral vascular disease ([place])    right leg   Personal history of chemotherapy    Personal history of colonic polyps    Personal history of radiation therapy    Personal history of venous thrombosis and embolism     Past Surgical History:  Procedure Laterality Date   ABDOMINAL HYSTERECTOMY     BREAST BIOPSY Left 07/2019   CARDIAC CATHETERIZATION  2004    DILATION AND CURETTAGE OF UTERUS     several   MASTECTOMY MODIFIED RADICAL Left 03/09/2020   Left Modified Radical Mastectomy (left mastectomy with axillary lymph node dissection)    MASTECTOMY MODIFIED RADICAL Left 03/09/2020   Procedure: LEFT MODIFIED RADICAL MASTECTOMY;  Surgeon: Stark Klein, MD;  Location: [place];  Service: General;  Laterality: Left;  GEN AND PECTORAL BLOCK   PORT-A-CATH REMOVAL N/A 08/31/2020   Procedure: REMOVAL PORT-A-CATH;  Surgeon: Stark Klein, MD;  Location: [place];  Service: General;  Laterality: N/A;   PORTACATH PLACEMENT Left 08/18/2019   Procedure: INSERTION PORT-A-CATH;  Surgeon: Stark Klein, MD;  Location: [place];  Service: General;  Laterality: Left;   Rt knee arthoscopic     SHOULDER ARTHROSCOPY W/ ROTATOR CUFF REPAIR Right 07/2016   TEE WITHOUT CARDIOVERSION N/A 02/21/2013   Procedure: TRANSESOPHAGEAL ECHOCARDIOGRAM (TEE);  Surgeon: Lelon Perla, MD;  Location: [facility] ENDOSCOPY;  Service: Cardiovascular;  Laterality: N/A;    Family History  Problem Relation Age of Onset   Alcohol abuse Other    Depression Other    Hyperlipidemia Other    Hypertension Other    Kidney disease Other    Colon cancer Mother        dx late 83s   Cervical cancer Maternal Grandmother    Stroke Paternal Grandmother    Breast cancer Maternal Aunt  dx 107s   Breast cancer Cousin        dx 23s    Social History   Tobacco Use   Smoking status: Former    Packs/day: 0.25    Types: Cigarettes    Quit date: 07/02/2019    Years since quitting: 2.8   Smokeless tobacco: Never  Vaping Use   Vaping Use: Never used  Substance Use Topics   Alcohol use: Yes    Comment: rare occasion   Drug use: No      Current Outpatient Medications:    Albuterol Sulfate (PROAIR RESPICLICK) 202 (90 Base) MCG/ACT AEPB, Inhale 2 puffs into the lungs every 6 (six) hours as needed., Disp: 2 each, Rfl: 1   amLODipine (NORVASC) 10 MG tablet, TAKE 1 TABLET BY MOUTH  DAILY, Disp: 90  tablet, Rfl: 3   atorvastatin (LIPITOR) 20 MG tablet, TAKE 1 TABLET BY MOUTH  DAILY, Disp: 90 tablet, Rfl: 3   Carboxymethylcellul-Glycerin (CLEAR EYES FOR DRY EYES) 1-0.25 % SOLN, Place 1 drop into both eyes 3 (three) times daily as needed (dry eyes). , Disp: , Rfl:    Cholecalciferol (VITAMIN D) 50 MCG (2000 UT) tablet, Take 2,000 Units by mouth daily., Disp: , Rfl:    fexofenadine (ALLEGRA) 180 MG tablet, Take 180 mg by mouth daily as needed for allergies. , Disp: , Rfl:    fluticasone (FLONASE) 50 MCG/ACT nasal spray, USE 2 SPRAYS IN EACH NOSTRIL DAILY. PT NEEDS TO SCHEDULE A FOLLOW UP APPT BEFORE NEXT REFILL. (Patient taking differently: Place 2 sprays into both nostrils daily.), Disp: 16 g, Rfl: 0   hydrALAZINE (APRESOLINE) 50 MG tablet, TAKE 1 TABLET BY MOUTH 3  TIMES DAILY, Disp: 270 tablet, Rfl: 3   metoprolol tartrate (LOPRESSOR) 50 MG tablet, TAKE 1 TABLET BY MOUTH  TWICE DAILY, Disp: 180 tablet, Rfl: 3   potassium chloride SA (KLOR-CON) 20 MEQ tablet, Take 20 mEq by mouth daily., Disp: , Rfl:    sodium chloride (OCEAN) 0.65 % nasal spray, Place into the nose., Disp: , Rfl:    warfarin (COUMADIN) 5 MG tablet, TAKE 1 TABLET BY MOUTH DAILY EXCEPT TAKE 1 AND 1/2 TABLETS BY MOUTH ON MONDAYS OR AS  DIRECTED BY ANTICOAGULATION  CLINIC, Disp: 102 tablet, Rfl: 2   furosemide (LASIX) 40 MG tablet, Take 0.5 tablets (20 mg total) by mouth daily., Disp: 90 tablet, Rfl: 3  EXAM: BP Readings from Last 3 Encounters:  05/02/22 126/70  03/28/22 (!) 141/79  11/16/21 (!) 156/66    VITALS per patient if applicable:  GENERAL: alert, oriented, appears well and in no acute distress pleasant normal conversation  HEENT: atraumatic, conjunttiva clear, no obvious abnormalities on inspection of external nose and ears  NECK: normal movements of the head and neck  LUNGS: on inspection no signs of respiratory distress, breathing rate appears normal, no obvious gross SOB, gasping or wheezing  CV: no obvious  cyanosis   PSYCH/NEURO: pleasant and cooperative, no obvious depression or anxiety, speech and thought processing grossly intact Lab Results  Component Value Date   WBC 5.3 05/02/2022   HGB 12.2 05/02/2022   HCT 37.9 05/02/2022   PLT 243.0 05/02/2022   GLUCOSE 96 05/02/2022   CHOL 149 05/02/2022   TRIG 122.0 05/02/2022   HDL 42.10 05/02/2022   LDLDIRECT 175.9 07/01/2007   LDLCALC 82 05/02/2022   ALT 20 05/02/2022   AST 25 05/02/2022   NA 140 05/02/2022   K 3.5 05/02/2022   CL 101 05/02/2022  CREATININE 1.74 (H) 05/02/2022   BUN 14 05/02/2022   CO2 30 05/02/2022   TSH 2.15 05/02/2022   INR 2.4 (H) 05/02/2022   HGBA1C 7.0 (H) 05/02/2022    ASSESSMENT AND PLAN:  Discussed the following assessment and plan:    ICD-10-CM   1. Type 2 diabetes mellitus with hyperglycemia, without long-term current use of insulin (HCC)  T41.96 Basic metabolic panel    Hemoglobin A1c    Hepatic function panel    VITAMIN D 25 Hydroxy (Vit-D Deficiency, Fractures)    Microalbumin / creatinine urine ratio    2. Stage 3 chronic kidney disease, unspecified whether stage 3a or 3b CKD (HCC)  Q22.97 Basic metabolic panel    Hemoglobin A1c    Hepatic function panel    VITAMIN D 25 Hydroxy (Vit-D Deficiency, Fractures)    Microalbumin / creatinine urine ratio    3. Medication management  L89.211 Basic metabolic panel    Hemoglobin A1c    Hepatic function panel    VITAMIN D 25 Hydroxy (Vit-D Deficiency, Fractures)    4. Vitamin D deficiency  H41.7 Basic metabolic panel    Hemoglobin A1c    Hepatic function panel    VITAMIN D 25 Hydroxy (Vit-D Deficiency, Fractures)    5. Elevated alkaline phosphatase measurement  E08.1 Basic metabolic panel    Hemoglobin A1c    Hepatic function panel    VITAMIN D 25 Hydroxy (Vit-D Deficiency, Fractures)    Discussed options she is planning on intensification of dietary lifestyle intervention at this time is declining nutritional referral because she is aware  of initial appropriate interventions such as decreasing processed carbs sugar sweets discussed other interventions.  We will follow-up lab in about 3 months to check RFT's vitamin D level also at that time in addition to A1c If her kidney function is appropriate she might benefit from SGLT2 last GFR was borderline 29 but has been 34 in the past ( dr Stanford Breed agrees  would be bendficial   and fu bmp if started but will hold for now and she will be doing lsi . )  Expectant management and discussion of plan and treatment with opportunity to ask questions and all were answered. The patient agreed with the plan and demonstrated an understanding of the instructions. 3 months ordered BMP hemoglobin A1c vitamin D LFTs urine microalb cr ratio And then follow-up visit either in person or virtual. Advised to call back or seek an in-person evaluation if worsening  or having  further concerns  in interim.  In the interim Return in about 3 months (around 08/09/2022) for lab fu and then visit or virutal for results .    Shanon Ace, MD

## 2022-05-10 ENCOUNTER — Telehealth: Payer: Self-pay | Admitting: Internal Medicine

## 2022-05-10 NOTE — Telephone Encounter (Signed)
Pt stated she had a missed call. I could not find who called her. Pt stated that if I did find who called her to tell them to call her back within the hour because she is getting on a plane.

## 2022-05-11 NOTE — Telephone Encounter (Signed)
Looks like Apolonio Schneiders was trying to reach patient for lab result yesterday. Today, tried to reach patient. Left a voicemail to call back.

## 2022-05-26 ENCOUNTER — Other Ambulatory Visit: Payer: Self-pay | Admitting: Cardiology

## 2022-06-09 DIAGNOSIS — I824Y9 Acute embolism and thrombosis of unspecified deep veins of unspecified proximal lower extremity: Secondary | ICD-10-CM | POA: Diagnosis not present

## 2022-06-09 DIAGNOSIS — I890 Lymphedema, not elsewhere classified: Secondary | ICD-10-CM | POA: Diagnosis not present

## 2022-06-09 DIAGNOSIS — Z171 Estrogen receptor negative status [ER-]: Secondary | ICD-10-CM | POA: Diagnosis not present

## 2022-06-09 DIAGNOSIS — C50812 Malignant neoplasm of overlapping sites of left female breast: Secondary | ICD-10-CM | POA: Diagnosis not present

## 2022-06-13 ENCOUNTER — Ambulatory Visit (INDEPENDENT_AMBULATORY_CARE_PROVIDER_SITE_OTHER): Payer: Medicare Other

## 2022-06-13 DIAGNOSIS — Z7901 Long term (current) use of anticoagulants: Secondary | ICD-10-CM | POA: Diagnosis not present

## 2022-06-13 LAB — POCT INR: INR: 2.2 (ref 2.0–3.0)

## 2022-06-13 NOTE — Progress Notes (Signed)
Continue 1 tablet daily except take 1/2 tablet on Mondays. Recheck in 6 weeks at [place].

## 2022-06-13 NOTE — Patient Instructions (Addendum)
Pre visit review using our clinic review tool, if applicable. No additional management support is needed unless otherwise documented below in the visit note.   Continue 1 tablet daily except take 1/2 tablet on Mondays. Recheck in 6 weeks at [place].

## 2022-06-14 ENCOUNTER — Ambulatory Visit: Payer: Medicare Other | Admitting: Podiatry

## 2022-06-15 ENCOUNTER — Other Ambulatory Visit: Payer: Self-pay

## 2022-06-15 ENCOUNTER — Encounter: Payer: Self-pay | Admitting: Cardiology

## 2022-06-15 DIAGNOSIS — I351 Nonrheumatic aortic (valve) insufficiency: Secondary | ICD-10-CM

## 2022-06-15 NOTE — Progress Notes (Signed)
Order for repeat echo placed.

## 2022-06-29 ENCOUNTER — Ambulatory Visit (HOSPITAL_COMMUNITY): Payer: Medicare Other | Attending: Cardiology

## 2022-06-29 DIAGNOSIS — I351 Nonrheumatic aortic (valve) insufficiency: Secondary | ICD-10-CM | POA: Insufficient documentation

## 2022-06-29 LAB — ECHOCARDIOGRAM COMPLETE
Area-P 1/2: 2.6 cm2
P 1/2 time: 493 msec
S' Lateral: 3.6 cm

## 2022-07-24 ENCOUNTER — Other Ambulatory Visit (INDEPENDENT_AMBULATORY_CARE_PROVIDER_SITE_OTHER): Payer: Medicare Other

## 2022-07-24 ENCOUNTER — Ambulatory Visit (INDEPENDENT_AMBULATORY_CARE_PROVIDER_SITE_OTHER): Payer: Medicare Other

## 2022-07-24 DIAGNOSIS — Z79899 Other long term (current) drug therapy: Secondary | ICD-10-CM | POA: Diagnosis not present

## 2022-07-24 DIAGNOSIS — R748 Abnormal levels of other serum enzymes: Secondary | ICD-10-CM

## 2022-07-24 DIAGNOSIS — E559 Vitamin D deficiency, unspecified: Secondary | ICD-10-CM

## 2022-07-24 DIAGNOSIS — E1165 Type 2 diabetes mellitus with hyperglycemia: Secondary | ICD-10-CM | POA: Diagnosis not present

## 2022-07-24 DIAGNOSIS — Z7901 Long term (current) use of anticoagulants: Secondary | ICD-10-CM

## 2022-07-24 DIAGNOSIS — N183 Chronic kidney disease, stage 3 unspecified: Secondary | ICD-10-CM

## 2022-07-24 LAB — BASIC METABOLIC PANEL
BUN: 21 mg/dL (ref 6–23)
CO2: 24 mEq/L (ref 19–32)
Calcium: 9.3 mg/dL (ref 8.4–10.5)
Chloride: 103 mEq/L (ref 96–112)
Creatinine, Ser: 1.85 mg/dL — ABNORMAL HIGH (ref 0.40–1.20)
GFR: 27.35 mL/min — ABNORMAL LOW (ref >60.00)
Glucose, Bld: 95 mg/dL (ref 70–99)
Potassium: 3.2 mEq/L — ABNORMAL LOW (ref 3.5–5.1)
Sodium: 142 mEq/L (ref 135–145)

## 2022-07-24 LAB — MICROALBUMIN / CREATININE URINE RATIO
Creatinine,U: 29.9 mg/dL
Microalb Creat Ratio: 2.3 mg/g (ref 0.0–30.0)
Microalb, Ur: <0.7 mg/dL (ref 0.0–1.9)

## 2022-07-24 LAB — HEPATIC FUNCTION PANEL
ALT: 17 U/L (ref 0–35)
AST: 23 U/L (ref 0–37)
Albumin: 4.4 g/dL (ref 3.5–5.2)
Alkaline Phosphatase: 141 U/L — ABNORMAL HIGH (ref 39–117)
Bilirubin, Direct: 0.1 mg/dL (ref 0.0–0.3)
Total Bilirubin: 0.4 mg/dL (ref 0.2–1.2)
Total Protein: 7.8 g/dL (ref 6.0–8.3)

## 2022-07-24 LAB — VITAMIN D 25 HYDROXY (VIT D DEFICIENCY, FRACTURES): VITD: 86.26 ng/mL (ref 30.00–100.00)

## 2022-07-24 LAB — HEMOGLOBIN A1C: Hgb A1c MFr Bld: 6.4 % (ref 4.6–6.5)

## 2022-07-24 LAB — POCT INR: INR: 3.4 — AB (ref 2.0–3.0)

## 2022-07-24 NOTE — Progress Notes (Signed)
Hg a1c is much better !, vit d in good range . But potassium is low .  Make a  fu visit to discuss  ( virtual is ok Thursday  since  I have had limited hours this week ) or next week

## 2022-07-24 NOTE — Progress Notes (Signed)
Pt reports being out of town over the weekend and drinking more wine than is typical for her.   Hold today's dose.  Then continue 1 tablet daily except take 1/2 tablet on Mondays. Recheck on 08/21/22 at 11:30.

## 2022-07-24 NOTE — Patient Instructions (Signed)
Hold today's dose.  Then continue 1 tablet daily except take 1/2 tablet on Mondays. Recheck on 08/21/22 at 11:30.

## 2022-08-14 ENCOUNTER — Telehealth: Payer: Self-pay | Admitting: Internal Medicine

## 2022-08-14 ENCOUNTER — Other Ambulatory Visit: Payer: Self-pay | Admitting: Hematology and Oncology

## 2022-08-14 DIAGNOSIS — Z1231 Encounter for screening mammogram for malignant neoplasm of breast: Secondary | ICD-10-CM

## 2022-08-14 NOTE — Telephone Encounter (Signed)
Pt call and stated she need a [facility] density [facility] as soon as possible so she can have the mammogram and bone density on the same day.

## 2022-08-14 NOTE — Telephone Encounter (Signed)
Yes please order dexa scan at breast center   dx  estrogen deficient , with a note to have mammo on same day

## 2022-08-15 ENCOUNTER — Other Ambulatory Visit: Payer: Self-pay

## 2022-08-15 DIAGNOSIS — E2839 Other primary ovarian failure: Secondary | ICD-10-CM

## 2022-08-15 NOTE — Telephone Encounter (Signed)
Called patient made aware that Order place to [facility] imaging breast center for Bone density exam, order placed with message to perform mammogram on the same day

## 2022-08-15 NOTE — Addendum Note (Signed)
Addended by: Otilio Miu on: 08/15/2022 01:06 PM   Modules accepted: Orders

## 2022-08-21 ENCOUNTER — Ambulatory Visit: Payer: Medicare Other

## 2022-09-05 ENCOUNTER — Other Ambulatory Visit: Payer: Self-pay | Admitting: Cardiology

## 2022-09-05 ENCOUNTER — Other Ambulatory Visit: Payer: Self-pay | Admitting: Internal Medicine

## 2022-09-05 DIAGNOSIS — Z7901 Long term (current) use of anticoagulants: Secondary | ICD-10-CM

## 2022-09-06 ENCOUNTER — Telehealth: Payer: Self-pay

## 2022-09-06 NOTE — Telephone Encounter (Signed)
Pt due for coumadin clinic apt from 11/13 but cancelled. Last apt was 10/23. LVM for pt to call to [facility] coumadin clinic apt. She likes her apts at [facility].

## 2022-09-08 NOTE — Telephone Encounter (Signed)
LVM on cell number.

## 2022-09-12 NOTE — Telephone Encounter (Signed)
Pt has returned call to [facility] apt.

## 2022-09-15 ENCOUNTER — Ambulatory Visit: Payer: Medicare Other | Admitting: Podiatry

## 2022-09-18 ENCOUNTER — Ambulatory Visit (INDEPENDENT_AMBULATORY_CARE_PROVIDER_SITE_OTHER): Payer: Medicare Other

## 2022-09-18 DIAGNOSIS — Z23 Encounter for immunization: Secondary | ICD-10-CM | POA: Diagnosis not present

## 2022-09-18 LAB — POCT INR: INR: 3.1 — AB (ref 2.0–3.0)

## 2022-09-18 NOTE — Patient Instructions (Addendum)
Pre visit review using our clinic review tool, if applicable. No additional management support is needed unless otherwise documented below in the visit note.  Hold dose today and the change weekly 1 tablet daily except take 1/2 tablet on Monday and Thursday. Recheck in 3 weeks.

## 2022-09-18 NOTE — Progress Notes (Addendum)
Pt reports decrease in consumption of greens.   Hold dose today and the change weekly 1 tablet daily except take 1/2 tablet on Monday and Thursday. Recheck in 3 weeks.   Pt requested flu vaccine. Administered high dose flu vaccine in R deltoid. Pt tolerated well.

## 2022-09-29 ENCOUNTER — Ambulatory Visit: Payer: Medicare Other

## 2022-10-05 ENCOUNTER — Ambulatory Visit
Admission: RE | Admit: 2022-10-05 | Discharge: 2022-10-05 | Disposition: A | Discharge status: Home / Self Care | Payer: Medicare Other | Source: Ambulatory Visit | Attending: Internal Medicine | Admitting: Internal Medicine

## 2022-10-05 ENCOUNTER — Ambulatory Visit
Admission: RE | Admit: 2022-10-05 | Discharge: 2022-10-05 | Disposition: A | Discharge status: Home / Self Care | Payer: Medicare Other | Source: Ambulatory Visit | Attending: Hematology and Oncology | Admitting: Hematology and Oncology

## 2022-10-05 DIAGNOSIS — Z780 Asymptomatic menopausal state: Secondary | ICD-10-CM | POA: Diagnosis not present

## 2022-10-05 DIAGNOSIS — Z1231 Encounter for screening mammogram for malignant neoplasm of breast: Secondary | ICD-10-CM | POA: Diagnosis not present

## 2022-10-05 DIAGNOSIS — E2839 Other primary ovarian failure: Secondary | ICD-10-CM

## 2022-10-05 DIAGNOSIS — M85832 Other specified disorders of bone density and structure, left forearm: Secondary | ICD-10-CM | POA: Diagnosis not present

## 2022-10-09 ENCOUNTER — Ambulatory Visit (INDEPENDENT_AMBULATORY_CARE_PROVIDER_SITE_OTHER): Payer: Medicare Other

## 2022-10-09 DIAGNOSIS — Z7901 Long term (current) use of anticoagulants: Secondary | ICD-10-CM | POA: Diagnosis not present

## 2022-10-09 LAB — POCT INR: INR: 1.9 — AB (ref 2.0–3.0)

## 2022-10-09 NOTE — Progress Notes (Signed)
Increase dose to take 1 tablet today and then continue taking 1 tablet daily except take1/2 tablet on Monday and Thursday. Recheck in 4 weeks.

## 2022-10-09 NOTE — Patient Instructions (Addendum)
Pre visit review using our clinic review tool, if applicable. No additional management support is needed unless otherwise documented below in the visit note.  Increase dose to take 1 tablet today and then continue taking 1 tablet daily except take1/2 tablet on Monday and Thursday. Recheck in 4 weeks.

## 2022-10-18 NOTE — Progress Notes (Signed)
HPI: FU nonischemic cardiomyopathy and aortic insufficiency. Previous catheterization in 2004 showed no obstructive coronary disease. CT of her chest in Nov 2008 showed no thoracic aneurysm. Previous Holter monitor secondary to "dizzy spells" showed PACs and PVCs. Nuclear study April 2014 showed an ejection fraction of 62% and normal perfusion. Patient underwent transesophageal echocardiogram in May of 2014. Her ejection fraction was 50-55%. There was moderate aortic insufficiency and mild mitral regurgitation. There was a linear density associated with the atrial septum of uncertain etiology. Patient has had occasional noncompliance in the past with medications. If blood pressure not controlled LV function noted to be reduced with worsening aortic insufficiency. Her ACE inhibitor was discontinued previously because of dehydration/renal insufficiency.  Last echocardiogram September 2023 showed normal LV function, mild left ventricular hypertrophy, grade 1 diastolic dysfunction, mild mitral regurgitation, moderate to severe aortic insufficiency.  Since I last saw her, the patient has dyspnea with more extreme activities but not with routine activities. It is relieved with rest. It is not associated with chest pain. There is no orthopnea, PND or pedal edema. There is no syncope or palpitations. There is no exertional chest pain.   Current Outpatient Medications  Medication Sig Dispense Refill   Albuterol Sulfate (PROAIR RESPICLICK) 657 (90 Base) MCG/ACT AEPB Inhale 2 puffs into the lungs every 6 (six) hours as needed. 2 each 1   amLODipine (NORVASC) 10 MG tablet TAKE 1 TABLET BY MOUTH DAILY 100 tablet 2   atorvastatin (LIPITOR) 20 MG tablet TAKE 1 TABLET BY MOUTH ONCE  DAILY 100 tablet 2   Carboxymethylcellul-Glycerin (CLEAR EYES FOR DRY EYES) 1-0.25 % SOLN Place 1 drop into both eyes 3 (three) times daily as needed (dry eyes).      Cholecalciferol (VITAMIN D) 50 MCG (2000 UT) tablet Take 2,000 Units  by mouth daily.     fexofenadine (ALLEGRA) 180 MG tablet Take 180 mg by mouth daily as needed for allergies.      fluticasone (FLONASE) 50 MCG/ACT nasal spray USE 2 SPRAYS IN EACH NOSTRIL DAILY. PT NEEDS TO SCHEDULE A FOLLOW UP APPT BEFORE NEXT REFILL. (Patient taking differently: Place 2 sprays into both nostrils daily.) 16 g 0   hydrALAZINE (APRESOLINE) 50 MG tablet TAKE 1 TABLET BY MOUTH 3 TIMES  DAILY 300 tablet 2   metoprolol tartrate (LOPRESSOR) 50 MG tablet TAKE 1 TABLET BY MOUTH TWICE  DAILY 200 tablet 2   sodium chloride (OCEAN) 0.65 % nasal spray Place into the nose.     warfarin (COUMADIN) 5 MG tablet TAKE 1 TABLET BY MOUTH DAILY  EXCEPT TAKE 1 AND 1/2 TABLETS BY MOUTH ON MONDAYS OR AS DIRECTED  BY ANTICOAGULATION CLINIC 109 tablet 2   furosemide (LASIX) 40 MG tablet Take 0.5 tablets (20 mg total) by mouth daily. 90 tablet 3   potassium chloride SA (KLOR-CON) 20 MEQ tablet Take 20 mEq by mouth daily. (Patient not taking: Reported on 11/01/2022)     No current facility-administered medications for this visit.     Past Medical History:  Diagnosis Date   Allergic rhinitis    Anemia    Aortic valve disorders    Arthritis    Asthma    Cancer (HCC)    Breast Cancer   CHF (congestive heart failure) (HCC)    Coronary artery disease    Family history of adverse reaction to anesthesia    difficulty waking mother up after surgery   Family history of breast cancer    Family history  of cervical cancer    Family history of colon cancer    Family history of throat cancer    Heart murmur    Hyperlipidemia    Hypertension    Long term (current) use of anticoagulants    Other primary cardiomyopathies    Peripheral vascular disease ([place])    right leg   Personal history of chemotherapy    Personal history of colonic polyps    Personal history of radiation therapy    Personal history of venous thrombosis and embolism     Past Surgical History:  Procedure Laterality Date   ABDOMINAL  HYSTERECTOMY     BREAST BIOPSY Left 07/2019   CARDIAC CATHETERIZATION  2004   DILATION AND CURETTAGE OF UTERUS     several   MASTECTOMY MODIFIED RADICAL Left 03/09/2020   Left Modified Radical Mastectomy (left mastectomy with axillary lymph node dissection)    MASTECTOMY MODIFIED RADICAL Left 03/09/2020   Procedure: LEFT MODIFIED RADICAL MASTECTOMY;  Surgeon: Stark Klein, MD;  Location: [place];  Service: General;  Laterality: Left;  GEN AND PECTORAL BLOCK   PORT-A-CATH REMOVAL N/A 08/31/2020   Procedure: REMOVAL PORT-A-CATH;  Surgeon: Stark Klein, MD;  Location: [place];  Service: General;  Laterality: N/A;   PORTACATH PLACEMENT Left 08/18/2019   Procedure: INSERTION PORT-A-CATH;  Surgeon: Stark Klein, MD;  Location: [place];  Service: General;  Laterality: Left;   Rt knee arthoscopic     SHOULDER ARTHROSCOPY W/ ROTATOR CUFF REPAIR Right 07/2016   TEE WITHOUT CARDIOVERSION N/A 02/21/2013   Procedure: TRANSESOPHAGEAL ECHOCARDIOGRAM (TEE);  Surgeon: Lelon Perla, MD;  Location: [facility] ENDOSCOPY;  Service: Cardiovascular;  Laterality: N/A;    Social History   Socioeconomic History   Marital status: Legally Separated    Spouse name: Not on file   Number of children: Not on file   Years of education: Not on file   Highest education level: Not on file  Occupational History    Comment: retired  Tobacco Use   Smoking status: Former    Packs/day: 0.25    Types: Cigarettes    Quit date: 07/02/2019    Years since quitting: 3.3   Smokeless tobacco: Never  Vaping Use   Vaping Use: Never used  Substance and Sexual Activity   Alcohol use: Yes    Comment: rare occasion   Drug use: No   Sexual activity: Not on file    Comment: Hysterectomy  Other Topics Concern   Not on file  Social History Narrative   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    Divorced   Regular exercise- no   5 hours sleep    Lives with a son age 49    No pets         Social Determinants of Health    Financial Resource Strain: Medium Risk (03/07/2022)   Overall Financial Resource Strain (CARDIA)    Difficulty of Paying Living Expenses: Somewhat hard  Food Insecurity: No Food Insecurity (03/07/2022)   Hunger Vital Sign    Worried About Running Out of Food in the Last Year: Never true    Ran Out of Food in the Last Year: Never true  Transportation Needs: No Transportation Needs (03/07/2022)   PRAPARE - Hydrologist (Medical): No    Lack of Transportation (Non-Medical): No  Recent Concern: Transportation Needs - Unmet Transportation Needs (01/26/2022)   PRAPARE - Hydrologist (Medical): Yes  Lack of Transportation (Non-Medical): No  Physical Activity: Inactive (03/07/2022)   Exercise Vital Sign    Days of Exercise per Week: 0 days    Minutes of Exercise per Session: 0 min  Stress: No Stress Concern Present (03/07/2022)   [place]    Feeling of Stress : Only a little  Social Connections: Moderately Isolated (03/01/2021)   Social Connection and Isolation Panel [NHANES]    Frequency of Communication with Friends and Family: Three times a week    Frequency of Social Gatherings with Friends and Family: Once a week    Attends Religious Services: 1 to 4 times per year    Active Member of [employer] or Organizations: No    Attends Archivist Meetings: Never    Marital Status: Separated  Intimate Partner Violence: Not At Risk (03/01/2021)   Humiliation, Afraid, Rape, and Kick questionnaire    Fear of Current or Ex-Partner: No    Emotionally Abused: No    Physically Abused: No    Sexually Abused: No    Family History  Problem Relation Age of Onset   Alcohol abuse Other    Depression Other    Hyperlipidemia Other    Hypertension Other    Kidney disease Other    Colon cancer Mother        dx late 25s   Cervical cancer Maternal Grandmother    Stroke Paternal  Grandmother    Breast cancer Maternal Aunt        dx 51s   Breast cancer Cousin        dx 4s    ROS: no fevers or chills, productive cough, hemoptysis, dysphasia, odynophagia, melena, hematochezia, dysuria, hematuria, rash, seizure activity, orthopnea, PND, pedal edema, claudication. Remaining systems are negative.  Physical Exam: Well-developed well-nourished in no acute distress.  Skin is warm and dry.  HEENT is normal.  Neck is supple.  Chest is clear to auscultation with normal expansion.  Cardiovascular exam is regular rate and rhythm.  Abdominal exam nontender or distended. No masses palpated. Extremities show no edema. neuro grossly intact  ECG-sinus bradycardia at a rate of 53, left ventricular hypertrophy, left axis deviation, nonspecific ST changes.  Personally reviewed  A/P  1 aortic insufficiency-moderate to severe on most recent echocardiogram.  However LV function was normal and there was no left ventricular enlargement.  Note her AI has appeared worse in the past when she has been noncompliant with medications and her blood pressure was elevated.  We will plan to repeat echocardiogram September 2024.  She understands she may require aortic valve replacement in the future.  2 history of nonischemic cardiomyopathy-LV function has normalized on most recent echocardiogram.  3 hypertension-blood pressure controlled.  Continue present medications.  4 history of recurrent DVTs-managed by primary care.  Continue Coumadin.  5 hyperlipidemia-continue statin.  6 chronic stage III kidney disease-continue to follow renal function.  Kirk Ruths, MD

## 2022-11-01 ENCOUNTER — Encounter: Payer: Self-pay | Admitting: Cardiology

## 2022-11-01 ENCOUNTER — Ambulatory Visit: Payer: Medicare Other | Attending: Cardiology | Admitting: Cardiology

## 2022-11-01 VITALS — BP 132/70 | HR 53 | Ht 66.0 in | Wt 218.0 lb

## 2022-11-01 DIAGNOSIS — I428 Other cardiomyopathies: Secondary | ICD-10-CM

## 2022-11-01 DIAGNOSIS — I10 Essential (primary) hypertension: Secondary | ICD-10-CM | POA: Diagnosis not present

## 2022-11-01 DIAGNOSIS — I351 Nonrheumatic aortic (valve) insufficiency: Secondary | ICD-10-CM | POA: Diagnosis not present

## 2022-11-01 NOTE — Patient Instructions (Signed)
  Follow-Up: At [facility], you and your health needs are our priority.  As part of our continuing mission to provide you with exceptional heart care, we have created designated Provider Care Teams.  These Care Teams include your primary Cardiologist (physician) and Advanced Practice Providers (APPs -  Physician Assistants and Nurse Practitioners) who all work together to provide you with the care you need, when you need it.  We recommend signing up for the patient portal called "MyChart".  Sign up information is provided on this After Visit Summary.  MyChart is used to connect with patients for Virtual Visits (Telemedicine).  Patients are able to view lab/test results, encounter notes, upcoming appointments, etc.  Non-urgent messages can be sent to your provider as well.   To learn more about what you can do with MyChart, go to [email or web address].    Your next appointment:   6 month(s)  Provider:   Kirk Ruths, MD

## 2022-11-06 ENCOUNTER — Ambulatory Visit: Payer: Medicare Other

## 2022-11-08 ENCOUNTER — Telehealth: Payer: Self-pay

## 2022-11-08 NOTE — Telephone Encounter (Signed)
Pt missed apt on 2/5 for coumadin clinic.  LVM to call to [facility].

## 2022-11-10 NOTE — Telephone Encounter (Signed)
LVM

## 2022-11-15 NOTE — Telephone Encounter (Signed)
LVM on home and cell number.

## 2022-11-16 ENCOUNTER — Inpatient Hospital Stay: Payer: Medicare Other | Attending: Hematology and Oncology | Admitting: Hematology and Oncology

## 2022-11-16 VITALS — BP 145/67 | HR 64 | Temp 97.3°F | Resp 18 | Wt 218.0 lb

## 2022-11-16 DIAGNOSIS — Z79899 Other long term (current) drug therapy: Secondary | ICD-10-CM | POA: Insufficient documentation

## 2022-11-16 DIAGNOSIS — I972 Postmastectomy lymphedema syndrome: Secondary | ICD-10-CM | POA: Insufficient documentation

## 2022-11-16 DIAGNOSIS — Z171 Estrogen receptor negative status [ER-]: Secondary | ICD-10-CM

## 2022-11-16 DIAGNOSIS — Z9012 Acquired absence of left breast and nipple: Secondary | ICD-10-CM | POA: Insufficient documentation

## 2022-11-16 DIAGNOSIS — Z9221 Personal history of antineoplastic chemotherapy: Secondary | ICD-10-CM | POA: Diagnosis not present

## 2022-11-16 DIAGNOSIS — Z7901 Long term (current) use of anticoagulants: Secondary | ICD-10-CM | POA: Diagnosis not present

## 2022-11-16 DIAGNOSIS — M8580 Other specified disorders of bone density and structure, unspecified site: Secondary | ICD-10-CM | POA: Diagnosis not present

## 2022-11-16 DIAGNOSIS — N2889 Other specified disorders of kidney and ureter: Secondary | ICD-10-CM

## 2022-11-16 DIAGNOSIS — Z923 Personal history of irradiation: Secondary | ICD-10-CM | POA: Diagnosis not present

## 2022-11-16 DIAGNOSIS — C50812 Malignant neoplasm of overlapping sites of left female breast: Secondary | ICD-10-CM

## 2022-11-16 NOTE — Progress Notes (Signed)
Patient Care Team: Panosh, Standley Brooking, MD as PCP - General (Internal Medicine) Stanford Breed Denice Bors, MD as PCP - Cardiology (Cardiology) Stanford Breed Denice Bors, MD (Cardiology) Susa Day, MD (Orthopedic Surgery) Mauro Kaufmann, RN as Oncology Nurse Navigator Rockwell Germany, RN as Oncology Nurse Navigator Nicholas Lose, MD as Consulting Physician (Hematology and Oncology)  DIAGNOSIS:  Encounter Diagnoses  Name Primary?   Malignant neoplasm of overlapping sites of left breast in female, estrogen receptor negative ([place]) Yes   Renal mass     SUMMARY OF ONCOLOGIC HISTORY: Oncology History  Malignant neoplasm of overlapping sites of left breast in female, estrogen receptor negative ([place])  08/01/2019 Initial Diagnosis   Patient palpated left breast lump and skin thickening. Mammogram showed a 3.6cm mass at 3:30 position, a 0.9cm mass at 3:00 position, a 0.7cm mass at 2:00 position, a 0.5cm mass at 2:00 position, a 0.3cm mass at 1:00 position, and a 0.4cm mass at 1:00 position, with 4 abnormal left axillary lymph nodes. Biopsy showed IDC, grade 3, HER-2 + (3+), ER/PR -, Ki67 70%, in the breast and lymph nodes.    08/06/2019 Cancer Staging   Staging form: Breast, AJCC 8th Edition - Clinical: Stage IIIB (cT4, cN1, cM0, G3, ER-, PR-, HER2+) - Signed by Nicholas Lose, MD on 08/06/2019   08/19/2019 - 01/02/2020 Chemotherapy   The patient had dexamethasone (DECADRON) 4 MG tablet, 4 mg (100 % of original dose 4 mg), Oral, 2 times daily, 1 of 1 cycle, Start date: 08/06/2019, End date: 01/19/2020 Dose modification: 4 mg (original dose 4 mg, Cycle 0) palonosetron (ALOXI) injection 0.25 mg, 0.25 mg, Intravenous,  Once, 6 of 6 cycles Administration: 0.25 mg (08/19/2019), 0.25 mg (09/08/2019), 0.25 mg (12/12/2019), 0.25 mg (01/02/2020), 0.25 mg (10/31/2019), 0.25 mg (11/22/2019) pegfilgrastim-cbqv (UDENYCA) injection 6 mg, 6 mg, Subcutaneous, Once, 2 of 2 cycles Administration: 6 mg (08/21/2019), 6 mg  (09/10/2019) CARBOplatin (PARAPLATIN) 480 mg in sodium chloride 0.9 % 250 mL chemo infusion, 480 mg (110.9 % of original dose 429.6 mg), Intravenous,  Once, 2 of 2 cycles Dose modification:   (original dose 429.6 mg, Cycle 1) Administration: 480 mg (08/19/2019), 340 mg (09/08/2019) DOCEtaxel (TAXOTERE) 160 mg in sodium chloride 0.9 % 250 mL chemo infusion, 75 mg/m2 = 160 mg, Intravenous,  Once, 6 of 6 cycles Dose modification: 60 mg/m2 (original dose 75 mg/m2, Cycle 2, Reason: Dose not tolerated), 50 mg/m2 (original dose 75 mg/m2, Cycle 5, Reason: Dose not tolerated) Administration: 160 mg (08/19/2019), 130 mg (09/08/2019), 110 mg (12/12/2019), 110 mg (01/02/2020), 110 mg (10/31/2019), 110 mg (11/22/2019) pertuzumab (PERJETA) 420 mg in sodium chloride 0.9 % 250 mL chemo infusion, 420 mg (100 % of original dose 420 mg), Intravenous, Once, 2 of 2 cycles Dose modification: 420 mg (original dose 420 mg, Cycle 1, Reason: Provider Judgment) Administration: 420 mg (08/19/2019), 420 mg (09/08/2019) fosaprepitant (EMEND) 150 mg, dexamethasone (DECADRON) 12 mg in sodium chloride 0.9 % 145 mL IVPB, , Intravenous,  Once, 3 of 3 cycles Administration:  (08/19/2019),  (09/08/2019),  (10/31/2019) trastuzumab-anns (KANJINTI) 750 mg in sodium chloride 0.9 % 250 mL chemo infusion, 756 mg (100 % of original dose 8 mg/kg), Intravenous,  Once, 6 of 6 cycles Dose modification: 8 mg/kg (original dose 8 mg/kg, Cycle 1, Reason: Other (see comments), Comment: change to approved brand), 6 mg/kg (original dose 6 mg/kg, Cycle 2, Reason: Other (see comments), Comment: brand change per insurance) Administration: 750 mg (08/19/2019), 567 mg (09/08/2019), 567 mg (10/31/2019), 567 mg (11/22/2019), 567  mg (12/12/2019), 504 mg (01/02/2020)  for chemotherapy treatment.    09/21/2019 - 10/03/2019 Hospital Admission   Intractable nausea and vomiting   01/23/2020 - 08/19/2020 Chemotherapy   Patient is on Treatment Plan : BREAST Trastuzumab q21d      03/09/2020 Surgery   Left mastectomy and left axillary lymph node dissection ([facility]): no residual carcinoma, 2 negative intramammary lymph nodes, and 19 negative left axillary lymph nodes.    05/27/2020 - 07/12/2020 Radiation Therapy   Adjuvant left chest wall radiation     CHIEF COMPLIANT: Breast cancer surveillance  INTERVAL HISTORY: [name] Foster is a 72 y.o. with above-mentioned history of left breast cancer. She presents to the clinic today for follow-up.  She reports some tightness in the left chest wall and axilla but otherwise doing quite well.  Denies any lumps or nodules in the right breast.   ALLERGIES:  is allergic to penicillins, tetanus toxoid, and aspirin.  MEDICATIONS:  Current Outpatient Medications  Medication Sig Dispense Refill   Albuterol Sulfate (PROAIR RESPICLICK) 123XX123 (90 Base) MCG/ACT AEPB Inhale 2 puffs into the lungs every 6 (six) hours as needed. 2 each 1   amLODipine (NORVASC) 10 MG tablet TAKE 1 TABLET BY MOUTH DAILY 100 tablet 2   atorvastatin (LIPITOR) 20 MG tablet TAKE 1 TABLET BY MOUTH ONCE  DAILY 100 tablet 2   Carboxymethylcellul-Glycerin (CLEAR EYES FOR DRY EYES) 1-0.25 % SOLN Place 1 drop into both eyes 3 (three) times daily as needed (dry eyes).      Cholecalciferol (VITAMIN D) 50 MCG (2000 UT) tablet Take 2,000 Units by mouth daily.     fexofenadine (ALLEGRA) 180 MG tablet Take 180 mg by mouth daily as needed for allergies.      fluticasone (FLONASE) 50 MCG/ACT nasal spray USE 2 SPRAYS IN EACH NOSTRIL DAILY. PT NEEDS TO SCHEDULE A FOLLOW UP APPT BEFORE NEXT REFILL. (Patient taking differently: Place 2 sprays into both nostrils daily.) 16 g 0   hydrALAZINE (APRESOLINE) 50 MG tablet TAKE 1 TABLET BY MOUTH 3 TIMES  DAILY 300 tablet 2   metoprolol tartrate (LOPRESSOR) 50 MG tablet TAKE 1 TABLET BY MOUTH TWICE  DAILY 200 tablet 2   sodium chloride (OCEAN) 0.65 % nasal spray Place into the nose.     warfarin (COUMADIN) 5 MG tablet TAKE 1 TABLET BY  MOUTH DAILY  EXCEPT TAKE 1 AND 1/2 TABLETS BY MOUTH ON MONDAYS OR AS DIRECTED  BY ANTICOAGULATION CLINIC 109 tablet 2   furosemide (LASIX) 40 MG tablet Take 0.5 tablets (20 mg total) by mouth daily. 90 tablet 3   No current facility-administered medications for this visit.    PHYSICAL EXAMINATION: ECOG PERFORMANCE STATUS: 1 - Symptomatic but completely ambulatory  Vitals:   11/16/22 1016  BP: (!) 145/67  Pulse: 64  Resp: 18  Temp: (!) 97.3 F (36.3 C)  SpO2: 100%   Filed Weights   11/16/22 1016  Weight: 218 lb (98.9 kg)    BREAST: No palpable lumps or nodules in the right breast or axilla.  No lumps or nodules in the left chest wall or axilla.. (exam performed in the presence of a chaperone)  LABORATORY DATA:  I have reviewed the data as listed    Latest Ref Rng & Units 07/24/2022   10:18 AM 05/02/2022   11:02 AM 06/15/2021    1:22 PM  CMP  Glucose 70 - 99 mg/dL 95  96  74   BUN 6 - 23 mg/dL 21  14  17   Creatinine 0.40 - 1.20 mg/dL 1.85  1.74  1.55   Sodium 135 - 145 mEq/L 142  140  138   Potassium 3.5 - 5.1 mEq/L 3.2  3.5  3.5   Chloride 96 - 112 mEq/L 103  101  101   CO2 19 - 32 mEq/L 24  30  27   $ Calcium 8.4 - 10.5 mg/dL 9.3  9.3  9.1   Total Protein 6.0 - 8.3 g/dL 7.8  7.3  7.4   Total Bilirubin 0.2 - 1.2 mg/dL 0.4  0.4  0.4   Alkaline Phos 39 - 117 U/L 141  124  118   AST 0 - 37 U/L 23  25  15   $ ALT 0 - 35 U/L 17  20  13     $ Lab Results  Component Value Date   WBC 5.3 05/02/2022   HGB 12.2 05/02/2022   HCT 37.9 05/02/2022   MCV 87.8 05/02/2022   PLT 243.0 05/02/2022   NEUTROABS 2.7 05/02/2022    ASSESSMENT & PLAN:  Malignant neoplasm of overlapping sites of left breast in female, estrogen receptor negative ([place]) 07/31/2020:Patient palpated left breast lump and skin thickening. Mammogram showed a 3.6cm mass at 3:30 position, a 0.9cm mass at 3:00 position, a 0.7cm mass at 2:00 position, a 0.5cm mass at 2:00 position, a 0.3cm mass at 1:00 position, and a  0.4cm mass at 1:00 position, with 4 abnormal left axillary lymph nodes. Biopsy showed IDC, grade 3, HER-2 + (3+), ER/PR -, Ki67 70%, in the breast and lymph nodes.    Treatment plan  1. Neoadjuvant chemotherapy with TCH Perjeta 6 cycles completed 01/02/2020 followed by Herceptin maintenance completed 08/19/20  2. 03/09/2020: Left mastectomy and left axillary lymph node dissection ([place]): no residual carcinoma, 2 negative intramammary lymph nodes, and 19 negative left axillary lymph nodes. 3. Followed by adjuvant radiation therapy completed 07/12/2020  --------------------------------------------------------------------------------------------------------------------------------------------- Left arm lymphedema: She is waiting for a lymphedema pump to arrive.  She is still working with physical therapy.    Breast Cancer Surveillance: 10/06/2022: right breast mammogram: Benign, density B 11/16/2022: Breast exam: Benign 10/06/2022: Bone density: T-score -1.4: Osteopenia: Calcium vitamin D and weightbearing exercises  Return to clinic in 1 year for follow-up   Return to clinic in 1 year for surveillance check and follow-up.    Orders Placed This Encounter  Procedures   MR Abdomen W Wo Contrast    Standing Status:   Future    Standing Expiration Date:   11/17/2023    Order Specific Question:   If indicated for the ordered procedure, I authorize the administration of contrast media per Radiology protocol    Answer:   Yes    Order Specific Question:   What is the patient's sedation requirement?    Answer:   No Sedation    Order Specific Question:   Does the patient have a pacemaker or implanted devices?    Answer:   No    Order Specific Question:   Preferred imaging location?    Answer:   [id number] (table limit-550lbs)   Ambulatory referral to Urology    Referral Priority:   Routine    Referral Type:   Consultation    Referral Reason:   Specialty Services Required    Referred to  Provider:   Festus Aloe, MD    Requested Specialty:   Urology    Number of Visits Requested:   1   The patient has a  good understanding of the overall plan. she agrees with it. she will call with any problems that may develop before the next visit here. Total time spent: 30 mins including face to face time and time spent for planning, charting and co-ordination of care   Harriette Ohara, MD 11/16/22    I Gardiner Coins am acting as a Education administrator for [employer]  I have reviewed the above documentation for accuracy and completeness, and I agree with the above.

## 2022-11-16 NOTE — Assessment & Plan Note (Signed)
07/31/2020:Patient palpated left breast lump and skin thickening. Mammogram showed a 3.6cm mass at 3:30 position, a 0.9cm mass at 3:00 position, a 0.7cm mass at 2:00 position, a 0.5cm mass at 2:00 position, a 0.3cm mass at 1:00 position, and a 0.4cm mass at 1:00 position, with 4 abnormal left axillary lymph nodes. Biopsy showed IDC, grade 3, HER-2 + (3+), ER/PR -, Ki67 70%, in the breast and lymph nodes.    Treatment plan  1. Neoadjuvant chemotherapy with TCH Perjeta 6 cycles completed 01/02/2020 followed by Herceptin maintenance completed 08/19/20  2. 03/09/2020: Left mastectomy and left axillary lymph node dissection ([place]): no residual carcinoma, 2 negative intramammary lymph nodes, and 19 negative left axillary lymph nodes. 3. Followed by adjuvant radiation therapy completed 07/12/2020  --------------------------------------------------------------------------------------------------------------------------------------------- Left arm lymphedema: She is waiting for a lymphedema pump to arrive.  She is still working with physical therapy.    Breast Cancer Surveillance: 10/06/2022: right breast mammogram: Benign, density B 11/16/2022: Breast exam: Benign 10/06/2022: Bone density: T-score -1.4: Osteopenia: Calcium vitamin D and weightbearing exercises  Return to clinic in 1 year for follow-up   Return to clinic in 1 year for surveillance check and follow-up.

## 2022-11-22 NOTE — Telephone Encounter (Signed)
LVM to call to schedule coumadin clinic apt.

## 2022-11-29 NOTE — Telephone Encounter (Signed)
LVM to call to [facility] coumadin clinic apt.

## 2022-12-01 ENCOUNTER — Ambulatory Visit
Admission: RE | Admit: 2022-12-01 | Discharge: 2022-12-01 | Disposition: A | Discharge status: Home / Self Care | Payer: Medicare Other | Source: Ambulatory Visit | Attending: Hematology and Oncology | Admitting: Hematology and Oncology

## 2022-12-01 DIAGNOSIS — N2889 Other specified disorders of kidney and ureter: Secondary | ICD-10-CM

## 2022-12-01 MED ORDER — GADOPICLENOL 0.5 MMOL/ML IV SOLN
10.0000 mL | Freq: Once | INTRAVENOUS | Status: AC | PRN
  Administered 2022-12-01: 10 mL via INTRAVENOUS

## 2022-12-04 ENCOUNTER — Encounter: Payer: Self-pay | Admitting: *Deleted

## 2022-12-04 NOTE — Progress Notes (Signed)
Per MD request referral faxed to [facility] [phone]) for evaluation of renal mass.

## 2022-12-06 NOTE — Telephone Encounter (Signed)
LVM

## 2022-12-11 ENCOUNTER — Telehealth: Payer: Self-pay | Admitting: *Deleted

## 2022-12-11 NOTE — Telephone Encounter (Signed)
RN placed call to [facility] to confirm that referral has been received.  Team states they received referral and would need to send a message to available providers for an available "work in" date and time.  States they will be in contact with pt.

## 2022-12-14 NOTE — Telephone Encounter (Signed)
LVM on cell phone

## 2022-12-18 ENCOUNTER — Ambulatory Visit: Payer: Medicare Other | Admitting: Podiatry

## 2022-12-18 NOTE — Telephone Encounter (Signed)
Noted. Letter printed and mailed.

## 2022-12-26 DIAGNOSIS — R590 Localized enlarged lymph nodes: Secondary | ICD-10-CM | POA: Diagnosis not present

## 2022-12-26 DIAGNOSIS — C642 Malignant neoplasm of left kidney, except renal pelvis: Secondary | ICD-10-CM | POA: Diagnosis not present

## 2023-01-17 NOTE — Telephone Encounter (Signed)
Pt overdue for INR check. Sending mychart msg.

## 2023-01-31 NOTE — Telephone Encounter (Signed)
LVM

## 2023-02-14 ENCOUNTER — Ambulatory Visit: Payer: Self-pay

## 2023-02-14 NOTE — Telephone Encounter (Signed)
Pt has not responded to multiple attempts and paths to contact her concerning warfarin management. Resolving anticoagulation episodes.

## 2023-02-14 NOTE — Progress Notes (Signed)
Resolving anticoagulation episodes. Pt has not been seen in coumadin clinic for over 3 months. Has not responded to multiple attempts at reaching her via various paths.

## 2023-03-08 ENCOUNTER — Encounter: Payer: Self-pay | Admitting: Family Medicine

## 2023-03-08 ENCOUNTER — Ambulatory Visit (INDEPENDENT_AMBULATORY_CARE_PROVIDER_SITE_OTHER): Payer: Medicare Other | Admitting: Family Medicine

## 2023-03-08 DIAGNOSIS — Z0000 Encounter for general adult medical examination without abnormal findings: Secondary | ICD-10-CM | POA: Diagnosis not present

## 2023-03-08 NOTE — Patient Instructions (Signed)
I really enjoyed getting to talk with you today! I am available on Tuesdays and Thursdays for virtual visits if you have any questions or concerns, or if I can be of any further assistance.   CHECKLIST FROM ANNUAL WELLNESS VISIT:  -Follow up (please call to schedule if not scheduled after visit):   -yearly for annual wellness visit with primary care office  Here is a list of your preventive care/health maintenance measures and the plan for each if any are due:  PLAN For any measures below that may be due:  -can get the covid booster and shingles vaccines at the pharmacy  Health Maintenance  Topic Date Due   COVID-19 Vaccine (5 - 2023-24 season) 03/24/2023 (Originally 06/02/2022)   Zoster Vaccines- Shingrix (1 of 2) 06/08/2023 (Originally 05/29/1970)   INFLUENZA VACCINE  05/03/2023   Medicare Annual Wellness (AWV)  03/07/2024   Colonoscopy  05/10/2024   MAMMOGRAM  10/05/2024   Pneumonia Vaccine 32+ Years old  Completed   DEXA SCAN  Completed   Hepatitis C Screening  Completed   HPV VACCINES  Aged Out   DTaP/Tdap/Td  Discontinued    -See a dentist at least yearly  -Get your eyes checked and then per your eye specialist's recommendations  -Other issues addressed today:  -advise no more than one 5oz pour of wine per 24 hours  -I have included below further information regarding a healthy whole foods based diet, physical activity guidelines for adults, stress management and opportunities for social connections. I hope you find this information useful.   -----------------------------------------------------------------------------------------------------------------------------------------------------------------------------------------------------------------------------------------------------------  NUTRITION: -eat real food: lots of colorful vegetables (half the plate) and fruits -5-7 servings of vegetables and fruits per day (fresh or steamed is best), exp. 2 servings of  vegetables with lunch and dinner and 2 servings of fruit per day. Berries and greens such as kale and collards are great choices.  -consume on a regular basis: whole grains (make sure first ingredient on label contains the word "whole"), fresh fruits, fish, nuts, seeds, healthy oils (such as olive oil, avocado oil, grape seed oil) -may eat small amounts of dairy and lean meat on occasion, but avoid processed meats such as ham, bacon, lunch meat, etc. -drink water -try to avoid fast food and pre-packaged foods, processed meat -most experts advise limiting sodium to < 2300mg  per day, should limit further is any chronic conditions such as high blood pressure, heart disease, diabetes, etc. The American Heart Association advised that < 1500mg  is is ideal -try to avoid foods that contain any ingredients with names you do not recognize  -try to avoid sugar/sweets (except for the natural sugar that occurs in fresh fruit) -try to avoid sweet drinks -try to avoid white rice, white bread, pasta (unless whole grain), white or yellow potatoes  EXERCISE GUIDELINES FOR ADULTS: -if you wish to increase your physical activity, do so gradually and with the approval of your doctor -STOP and seek medical care immediately if you have any chest pain, chest discomfort or trouble breathing when starting or increasing exercise  -move and stretch your body, legs, feet and arms when sitting for long periods -Physical activity guidelines for optimal health in adults: -least 150 minutes per week of aerobic exercise (can talk, but not sing) once approved by your doctor, 20-30 minutes of sustained activity or two 10 minute episodes of sustained activity every day.  -resistance training at least 2 days per week if approved by your doctor -balance exercises 3+ days per week:  Stand somewhere where you have something sturdy to hold onto if you lose balance.    1) lift up on toes, start with 5x per day and work up to 20x   2)  stand and lift on leg straight out to the side so that foot is a few inches of the floor, start with 5x each side and work up to 20x each side   3) stand on one foot, start with 5 seconds each side and work up to 20 seconds on each side  If you need ideas or help with getting more active:  -Silver sneakers https://tools.silversneakers.com  -Walk with a Doc: http://www.duncan-williams.com/  -try to include resistance (weight lifting/strength building) and balance exercises twice per week: or the following link for ideas: http://castillo-powell.com/  BuyDucts.dk  STRESS MANAGEMENT: -can try meditating, or just sitting quietly with deep breathing while intentionally relaxing all parts of your body for 5 minutes daily -if you need further help with stress, anxiety or depression please follow up with your primary doctor or contact the wonderful folks at [facility]: [phone]  SOCIAL CONNECTIONS: -options in [place] if you wish to engage in more social and exercise related activities:  -Silver sneakers https://tools.silversneakers.com  -Walk with a Doc: http://www.duncan-williams.com/  -Check out the [facility] Active Adults 50+ section on the [place] [employer] (hiking clubs, book clubs, cards and games, chess, exercise classes, aquatic classes and much more) - see the website for details: https://www.[place].gov/departments/parks-recreation/active-adults50  -YouTube has lots of exercise videos for different ages and abilities as well  -[facility] (a variety of indoor and outdoor inperson activities for adults). 7262966246. [address].  -Virtual Online Classes (a variety of topics): see seniorplanet.org or call [phone]  -consider volunteering at a school, hospice center, church, senior center or elsewhere    ADVANCED HEALTHCARE  DIRECTIVES:  Everyone should have advanced health care directives in place. This is so that you get the care you want, should you ever be in a situation where you are unable to make your own medical decisions.   From the [place] Advanced Directive Website: "Advance Health Care Directives are legal documents in which you give written instructions about your health care if, in the future, you cannot speak for yourself.   A health care power of attorney allows you to name a person you trust to make your health care decisions if you cannot make them yourself. A declaration of a desire for a natural death (or living will) is document, which states that you desire not to have your life prolonged by extraordinary measures if you have a terminal or incurable illness or if you are in a vegetative state. An advance instruction for mental health treatment makes a declaration of instructions, information and preferences regarding your mental health treatment. It also states that you are aware that the advance instruction authorizes a mental health treatment provider to act according to your wishes. It may also outline your consent or refusal of mental health treatment. A declaration of an anatomical gift allows anyone over the age of 75 to make a gift by will, organ donor card or other document."   Please see the following website or an elder law attorney for forms, FAQs and for completion of advanced directives: [place] Health Care Directives Advance Health Care Directives (http://guzman.com/)  Or copy and paste the following to your web browser: PoshChat.fi

## 2023-03-08 NOTE — Progress Notes (Signed)
PATIENT CHECK-IN and HEALTH RISK ASSESSMENT QUESTIONNAIRE:  -completed by phone/video for upcoming Medicare Preventive Visit  Pre-Visit Check-in: 1)Vitals (height, wt, BP, etc) - record in vitals section for visit on day of visit 2)Review and Update Medications, Allergies PMH, Surgeries, Social history in Epic 3)Hospitalizations in the last year with date/reason? no  4)Review and Update Care Team (patient's specialists) in Epic 5) Complete PHQ9 in Epic  6) Complete Fall Screening in Epic 7)Review all Health Maintenance Due and order under PCP if not done.  8)Medicare Wellness Questionnaire: Answer theses question about your habits: Do you drink alcohol? Yes  If yes, how many drinks do you have a day?1 glass  of wine ,2-3 times a month Have you ever smoked?yes  Quit date if applicable? 07/02/2019  How many packs a day do/did you smoke? 3 cigarette a day.  Do you use smokeless tobacco?no Do you use an illicit drugs?no Do you exercises? Yes IF so, what type and how many days/minutes per week?walks for 2-3 days of the week for at least 30 mins.  Are you sexually active? No Lives with son so doesn't have as much control over diet as she would like Typical breakfast:cereal, sausage biscuit Typical lunch: sandwich-tuna or Lunchmeat([place], [place]) Typical dinner: frozen entry, sometimes in the weekend have dinner with son. Eat what they prepared. Typical snacks:grapes, strawberry, banana  Beverages: water, Ocean spray brand juice, pepsi, a cup of tea or coffee in the morning(mostly in winter time)  Answer theses question about you: Can you perform most household chores?yes but have to sit and rest every so often.  Do you find it hard to follow a conversation in a noisy room?no Do you often ask people to speak up or repeat themselves?no Do you feel that you have a problem with memory?no Do you balance your checkbook and or bank acounts?yes Do you feel safe at home?yes Last dentist visit?its  been over a year.  Do you need assistance with any of the following: Please note if so - none  Driving? Yes, No transportation  Feeding yourself?  Getting from bed to chair?  Getting to the toilet?  Bathing or showering?  Dressing yourself?  Managing money?  Climbing a flight of stairs  Preparing meals?  Do you have Advanced Directives in place (Living Will, Healthcare Power or Attorney)? No   Last eye Exam and location?Its been over a year. Trying to get an appointment now. Usually go to [facility].    Do you currently use prescribed or non-prescribed narcotic or opioid pain medications?No  Do you have a history or close family history of breast, ovarian, tubal or peritoneal cancer or a family member with BRCA (breast cancer susceptibility 1 and 2) gene mutations? Yes, pt had breast cancer.   Nurse/Assistant Credentials/time stamp: Karpuih M./CMA/11:45am.    ----------------------------------------------------------------------------------------------------------------------------------------------------------------------------------------------------------------------   MEDICARE ANNUAL PREVENTIVE VISIT WITH PROVIDER: (Welcome to [employer], initial annual wellness or annual wellness exam)  Virtual Visit via Video Note  I connected with Dalida Moody-Haskins on 03/08/23 by a video enabled telemedicine application and verified that I am speaking with the correct person using two identifiers.  Location patient: home Location provider:work or home office Persons participating in the virtual visit: patient, provider  Concerns and/or follow up today: none, reports stable   See HM section in Epic for other details of completed HM.    ROS: negative for report of fevers, unintentional weight loss, vision changes, vision loss, hearing loss or change, chest pain, sob, hemoptysis,  melena, hematochezia, hematuria, falls, bleeding or bruising, thoughts of suicide or self harm, memory  loss  Patient-completed extensive health risk assessment - reviewed and discussed with the patient: See Health Risk Assessment completed with patient prior to the visit either above or in recent phone note. This was reviewed in detailed with the patient today and appropriate recommendations, orders and referrals were placed as needed per Summary below and patient instructions.   Review of Medical History: -PMH, PSH, Family History and current specialty and care providers reviewed and updated and listed below   Patient Care Team: Panosh, Neta Mends, MD as PCP - General (Internal Medicine) Jens Som Madolyn Frieze, MD as PCP - Cardiology (Cardiology) Lewayne Bunting, MD (Cardiology) Jene Every, MD (Orthopedic Surgery) Pershing Proud, RN as Oncology Nurse Navigator Donnelly Angelica, RN as Oncology Nurse Navigator Serena Croissant, MD as Consulting Physician (Hematology and Oncology)   Past Medical History:  Diagnosis Date   Allergic rhinitis    Anemia    Aortic valve disorders    Arthritis    Asthma    Cancer [facility])    Breast Cancer   CHF (congestive heart failure) (HCC)    Coronary artery disease    Family history of adverse reaction to anesthesia    difficulty waking mother up after surgery   Family history of breast cancer    Family history of cervical cancer    Family history of colon cancer    Family history of throat cancer    Heart murmur    Hyperlipidemia    Hypertension    Intractable nausea and vomiting 09/21/2019   Long term (current) use of anticoagulants    Other primary cardiomyopathies    Peripheral vascular disease (HCC)    right leg   Personal history of chemotherapy    Personal history of colonic polyps    Personal history of radiation therapy    Personal history of venous thrombosis and embolism     Past Surgical History:  Procedure Laterality Date   ABDOMINAL HYSTERECTOMY     BREAST BIOPSY Left 07/2019   CARDIAC CATHETERIZATION  2004   DILATION AND  CURETTAGE OF UTERUS     several   MASTECTOMY MODIFIED RADICAL Left 03/09/2020   Left Modified Radical Mastectomy (left mastectomy with axillary lymph node dissection)    MASTECTOMY MODIFIED RADICAL Left 03/09/2020   Procedure: LEFT MODIFIED RADICAL MASTECTOMY;  Surgeon: Almond Lint, MD;  Location: [facility] OR;  Service: General;  Laterality: Left;  GEN AND PECTORAL BLOCK   PORT-A-CATH REMOVAL N/A 08/31/2020   Procedure: REMOVAL PORT-A-CATH;  Surgeon: Almond Lint, MD;  Location: [facility] OR;  Service: General;  Laterality: N/A;   PORTACATH PLACEMENT Left 08/18/2019   Procedure: INSERTION PORT-A-CATH;  Surgeon: Almond Lint, MD;  Location: [facility] OR;  Service: General;  Laterality: Left;   Rt knee arthoscopic     SHOULDER ARTHROSCOPY W/ ROTATOR CUFF REPAIR Right 07/2016   TEE WITHOUT CARDIOVERSION N/A 02/21/2013   Procedure: TRANSESOPHAGEAL ECHOCARDIOGRAM (TEE);  Surgeon: Lewayne Bunting, MD;  Location[facility] ENDOSCOPY;  Service: Cardiovascular;  Laterality: N/A;    Social History   Socioeconomic History   Marital status: Legally Separated    Spouse name: Not on file   Number of children: Not on file   Years of education: Not on file   Highest education level: Not on file  Occupational History    Comment: retired  Tobacco Use   Smoking status: Former    Packs/day: .25  Types: Cigarettes    Quit date: 07/02/2019    Years since quitting: 3.6   Smokeless tobacco: Never  Vaping Use   Vaping Use: Never used  Substance and Sexual Activity   Alcohol use: Yes    Alcohol/week: 1.0 standard drink of alcohol    Types: 1 Glasses of wine per week    Comment: rare occasion   Drug use: No   Sexual activity: Not on file    Comment: Hysterectomy  Other Topics Concern   Not on file  Social History Narrative   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    Divorced   Regular exercise- no   5 hours sleep    Lives with a son age 42    No pets         Social Determinants of Health   Financial  Resource Strain: Medium Risk (03/08/2023)   Overall Financial Resource Strain (CARDIA)    Difficulty of Paying Living Expenses: Somewhat hard  Food Insecurity: Food Insecurity Present (03/08/2023)   Hunger Vital Sign    Worried About Running Out of Food in the Last Year: Sometimes true    Ran Out of Food in the Last Year: Sometimes true  Transportation Needs: Unmet Transportation Needs (03/08/2023)   PRAPARE - Administrator, Civil Service (Medical): Yes    Lack of Transportation (Non-Medical): Yes  Physical Activity: Insufficiently Active (03/08/2023)   Exercise Vital Sign    Days of Exercise per Week: 3 days    Minutes of Exercise per Session: 30 min  Stress: No Stress Concern Present (03/08/2023)   [employer] of Occupational Health - Occupational Stress Questionnaire    Feeling of Stress : Not at all  Social Connections: Unknown (03/08/2023)   Social Connection and Isolation Panel [NHANES]    Frequency of Communication with Friends and Family: Once a week    Frequency of Social Gatherings with Friends and Family: Never    Attends Religious Services: Patient unable to answer    Active Member of Clubs or Organizations: No    Attends Banker Meetings: Never    Marital Status: Separated  Intimate Partner Violence: Not At Risk (03/01/2021)   Humiliation, Afraid, Rape, and Kick questionnaire    Fear of Current or Ex-Partner: No    Emotionally Abused: No    Physically Abused: No    Sexually Abused: No    Family History  Problem Relation Age of Onset   Alcohol abuse Other    Depression Other    Hyperlipidemia Other    Hypertension Other    Kidney disease Other    Colon cancer Mother        dx late 104s   Cervical cancer Maternal Grandmother    Stroke Paternal Grandmother    Breast cancer Maternal Aunt        dx 88s   Breast cancer Cousin        dx 47s    Current Outpatient Medications on File Prior to Visit  Medication Sig Dispense Refill   Albuterol  Sulfate (PROAIR RESPICLICK) 108 (90 Base) MCG/ACT AEPB Inhale 2 puffs into the lungs every 6 (six) hours as needed. 2 each 1   amLODipine (NORVASC) 10 MG tablet TAKE 1 TABLET BY MOUTH DAILY 100 tablet 2   atorvastatin (LIPITOR) 20 MG tablet TAKE 1 TABLET BY MOUTH ONCE  DAILY 100 tablet 2   Carboxymethylcellul-Glycerin (CLEAR EYES FOR DRY EYES) 1-0.25 % SOLN Place 1 drop into both  eyes 3 (three) times daily as needed (dry eyes).      Cholecalciferol (VITAMIN D) 50 MCG (2000 UT) tablet Take 2,000 Units by mouth daily.     fexofenadine (ALLEGRA) 180 MG tablet Take 180 mg by mouth daily as needed for allergies.      fluticasone (FLONASE) 50 MCG/ACT nasal spray USE 2 SPRAYS IN EACH NOSTRIL DAILY. PT NEEDS TO SCHEDULE A FOLLOW UP APPT BEFORE NEXT REFILL. (Patient taking differently: Place 2 sprays into both nostrils daily.) 16 g 0   hydrALAZINE (APRESOLINE) 50 MG tablet TAKE 1 TABLET BY MOUTH 3 TIMES  DAILY 300 tablet 2   metoprolol tartrate (LOPRESSOR) 50 MG tablet TAKE 1 TABLET BY MOUTH TWICE  DAILY 200 tablet 2   sodium chloride (OCEAN) 0.65 % nasal spray Place into the nose.     warfarin (COUMADIN) 5 MG tablet TAKE 1 TABLET BY MOUTH DAILY  EXCEPT TAKE 1 AND 1/2 TABLETS BY MOUTH ON MONDAYS OR AS DIRECTED  BY ANTICOAGULATION CLINIC 109 tablet 2   furosemide (LASIX) 40 MG tablet Take 0.5 tablets (20 mg total) by mouth daily. 90 tablet 3   [DISCONTINUED] prochlorperazine (COMPAZINE) 10 MG tablet Take 1 tablet (10 mg total) by mouth every 6 (six) hours as needed (Nausea or vomiting). 30 tablet 1   No current facility-administered medications on file prior to visit.    Allergies  Allergen Reactions   Penicillins Rash    Did it involve swelling of the face/tongue/throat, SOB, or low BP? Unknown Did it involve sudden or severe rash/hives, skin peeling, or any reaction on the inside of your mouth or nose? Yes Did you need to seek medical attention at a hospital or doctor's office? Yes When did it last  happen?      in her 30s If all above answers are "NO", may proceed with cephalosporin use.  Did it involve swelling of the face/tongue/throat, SOB, or low BP? Unknown Did it involve sudden or severe rash/hives, skin peeling, or any reaction on the inside of your mouth or nose? Yes Did you need to seek medical attention at a hospital or doctor's office? Yes When did it last happen?      in her 30s If all above answers are "NO", may proceed with cephalosporin use. Did it involve swelling of the face/tongue/throat, SOB, or low BP? Unknown Did it involve sudden or severe rash/hives, skin peeling, or any reaction on the inside of your mouth or nose? Yes Did you need to seek medical attention at a hospital or doctor's office? Yes When did it last happen?      in her 30s If all above answers are "NO", may proceed with cephalosporin use.   Tetanus Toxoid Rash    Caused cellulitis    Aspirin Rash       Physical Exam There were no vitals filed for this visit. Estimated body mass index is 35.19 kg/m as calculated from the following:   Height as of 11/01/22: 5\' 6"  (1.676 m).   Weight as of 11/16/22: 218 lb (98.9 kg).  EKG (optional): deferred due to virtual visit  GENERAL: alert, oriented, no acute distress detected, full vision exam deferred due to pandemic and/or virtual encounter   HEENT: atraumatic, conjunttiva clear, no obvious abnormalities on inspection of external nose and ears  NECK: normal movements of the head and neck  LUNGS: on inspection no signs of respiratory distress, breathing rate appears normal, no obvious gross SOB, gasping or wheezing  CV: no  obvious cyanosis  MS: moves all visible extremities without noticeable abnormality  PSYCH/NEURO: pleasant and cooperative, no obvious depression or anxiety, speech and thought processing grossly intact, Cognitive function grossly intact  Flowsheet Row Office Visit from 05/02/2022 in [facility] [place]   PHQ-9 Total Score 0           03/08/2023   11:33 AM 05/02/2022   10:36 AM 03/07/2022   12:20 PM 03/01/2021   11:54 AM 12/20/2020   10:28 AM  Depression screen PHQ 2/9  Decreased Interest 0 0 0 0 0  Down, Depressed, Hopeless 0 0 0 0 0  PHQ - 2 Score 0 0 0 0 0  Altered sleeping  0     Tired, decreased energy  0     Change in appetite  0     Feeling bad or failure about yourself   0     Trouble concentrating  0     Moving slowly or fidgety/restless  0     Suicidal thoughts  0     PHQ-9 Score  0          03/07/2022   12:19 PM 03/28/2022    5:23 PM 05/02/2022   10:37 AM 03/08/2023   10:11 AM 03/08/2023   11:27 AM  Fall Risk  Falls in the past year? 0  0 0 0  Was there an injury with Fall? 0  0  0  Fall Risk Category Calculator 0  0  0  Fall Risk Category (Retired) Low  Low    (RETIRED) Patient Fall Risk Level Low fall risk Low fall risk Low fall risk    Patient at Risk for Falls Due to Medication side effect  No Fall Risks  No Fall Risks  Fall risk Follow up Falls evaluation completed;Education provided;Falls prevention discussed  Falls evaluation completed  Falls evaluation completed     SUMMARY AND PLAN:  Encounter for Medicare annual wellness exam   Discussed applicable health maintenance/preventive health measures and advised and referred or ordered per patient preferences: -she is allergic to tetanus vaccine, reports had major reaction and was told to never have again - discontinued -discussed covid boosters and shingrix and how to obtain if she wishes Health Maintenance  Topic Date Due   COVID-19 Vaccine (5 - 2023-24 season) 03/24/2023 (Originally 06/02/2022)   Zoster Vaccines- Shingrix (1 of 2) 06/08/2023 (Originally 05/29/1970)   INFLUENZA VACCINE  05/03/2023   Medicare Annual Wellness (AWV)  03/07/2024   Colonoscopy  05/10/2024   MAMMOGRAM  10/05/2024   Pneumonia Vaccine 23+ Years old  Completed   DEXA SCAN  Completed   Hepatitis C Screening  Completed   HPV VACCINES   Aged Out   DTaP/Tdap/Td  Discontinued     Education and counseling on the following was provided based on the above review of health and a plan/checklist for the patient, along with additional information discussed, was provided for the patient in the patient instructions :  -Advised on importance of completing advanced directives, discussed options for completing and provided information in patient instructions as well -Provided counseling for balance. Reviewed and demonstrated safe balance exercises that can be done at home to improve balance and discussed exercise guidelines for adults with include balance exercises at least 3 days per week.  -Advised and counseled on a healthy lifestyle - including the importance of a healthy diet, regular physical activity, social connections and stress management. -Reviewed patient's current diet. Advised and counseled on  a whole foods based healthy diet. A summary of a healthy diet was provided in the Patient Instructions.  -reviewed patient's current physical activity level and discussed exercise guidelines for adults. Discussed community resources and ideas for safe exercise at home to assist in meeting exercise guideline recommendations in a safe and healthy way. She seems interested in trying to increase exercise at home - balance exercise, small hand weights, get up and go, walking -Advise yearly dental visits at minimum and regular eye exams -Advised and counseled on alcohol safe limits  Follow up: see patient instructions     Patient Instructions  I really enjoyed getting to talk with you today! I am available on Tuesdays and Thursdays for virtual visits if you have any questions or concerns, or if I can be of any further assistance.   CHECKLIST FROM ANNUAL WELLNESS VISIT:  -Follow up (please call to schedule if not scheduled after visit):   -yearly for annual wellness visit with primary care office  Here is a list of your preventive  care/health maintenance measures and the plan for each if any are due:  PLAN For any measures below that may be due:  -can get the covid booster and shingles vaccines at the pharmacy  Health Maintenance  Topic Date Due   COVID-19 Vaccine (5 - 2023-24 season) 03/24/2023 (Originally 06/02/2022)   Zoster Vaccines- Shingrix (1 of 2) 06/08/2023 (Originally 05/29/1970)   INFLUENZA VACCINE  05/03/2023   Medicare Annual Wellness (AWV)  03/07/2024   Colonoscopy  05/10/2024   MAMMOGRAM  10/05/2024   Pneumonia Vaccine 88+ Years old  Completed   DEXA SCAN  Completed   Hepatitis C Screening  Completed   HPV VACCINES  Aged Out   DTaP/Tdap/Td  Discontinued    -See a dentist at least yearly  -Get your eyes checked and then per your eye specialist's recommendations  -Other issues addressed today:  -advise no more than one 5oz pour of wine per 24 hours  -I have included below further information regarding a healthy whole foods based diet, physical activity guidelines for adults, stress management and opportunities for social connections. I hope you find this information useful.   -----------------------------------------------------------------------------------------------------------------------------------------------------------------------------------------------------------------------------------------------------------  NUTRITION: -eat real food: lots of colorful vegetables (half the plate) and fruits -5-7 servings of vegetables and fruits per day (fresh or steamed is best), exp. 2 servings of vegetables with lunch and dinner and 2 servings of fruit per day. Berries and greens such as kale and collards are great choices.  -consume on a regular basis: whole grains (make sure first ingredient on label contains the word "whole"), fresh fruits, fish, nuts, seeds, healthy oils (such as olive oil, avocado oil, grape seed oil) -may eat small amounts of dairy and lean meat on occasion, but avoid  processed meats such as ham, bacon, lunch meat, etc. -drink water -try to avoid fast food and pre-packaged foods, processed meat -most experts advise limiting sodium to < 2300mg  per day, should limit further is any chronic conditions such as high blood pressure, heart disease, diabetes, etc. The American Heart Association advised that < 1500mg  is is ideal -try to avoid foods that contain any ingredients with names you do not recognize  -try to avoid sugar/sweets (except for the natural sugar that occurs in fresh fruit) -try to avoid sweet drinks -try to avoid white rice, white bread, pasta (unless whole grain), white or yellow potatoes  EXERCISE GUIDELINES FOR ADULTS: -if you wish to increase your physical activity, do so gradually  and with the approval of your doctor -STOP and seek medical care immediately if you have any chest pain, chest discomfort or trouble breathing when starting or increasing exercise  -move and stretch your body, legs, feet and arms when sitting for long periods -Physical activity guidelines for optimal health in adults: -least 150 minutes per week of aerobic exercise (can talk, but not sing) once approved by your doctor, 20-30 minutes of sustained activity or two 10 minute episodes of sustained activity every day.  -resistance training at least 2 days per week if approved by your doctor -balance exercises 3+ days per week:   Stand somewhere where you have something sturdy to hold onto if you lose balance.    1) lift up on toes, start with 5x per day and work up to 20x   2) stand and lift on leg straight out to the side so that foot is a few inches of the floor, start with 5x each side and work up to 20x each side   3) stand on one foot, start with 5 seconds each side and work up to 20 seconds on each side  If you need ideas or help with getting more active:  -Silver sneakers https://tools.silversneakers.com  -Walk with a Doc: http://www.duncan-williams.com/  -try to  include resistance (weight lifting/strength building) and balance exercises twice per week: or the following link for ideas: http://castillo-powell.com/  BuyDucts.dk  STRESS MANAGEMENT: -can try meditating, or just sitting quietly with deep breathing while intentionally relaxing all parts of your body for 5 minutes daily -if you need further help with stress, anxiety or depression please follow up with your primary doctor or contact the wonderful folks at [facility]: [phone]  SOCIAL CONNECTIONS: -options in [place] if you wish to engage in more social and exercise related activities:  -Silver sneakers https://tools.silversneakers.com  -Walk with a Doc: http://www.duncan-williams.com/  -Check out the [facility] Active Adults 50+ section on the [place] of [employer] (hiking clubs, book clubs, cards and games, chess, exercise classes, aquatic classes and much more) - see the website for details: https://www.[place].gov/departments/parks-recreation/active-adults50  -YouTube has lots of exercise videos for different ages and abilities as well  -[facility] (a variety of indoor and outdoor inperson activities for adults). (564)653-4171. [address].  -Virtual Online Classes (a variety of topics): see seniorplanet.org or call [phone]  -consider volunteering at a school, hospice center, church, senior center or elsewhere    ADVANCED HEALTHCARE DIRECTIVES:  Everyone should have advanced health care directives in place. This is so that you get the care you want, should you ever be in a situation where you are unable to make your own medical decisions.   From the [place] Advanced Directive Website: "Advance Health Care Directives are legal documents in which you give written instructions about your health care if, in the future, you cannot speak for  yourself.   A health care power of attorney allows you to name a person you trust to make your health care decisions if you cannot make them yourself. A declaration of a desire for a natural death (or living will) is document, which states that you desire not to have your life prolonged by extraordinary measures if you have a terminal or incurable illness or if you are in a vegetative state. An advance instruction for mental health treatment makes a declaration of instructions, information and preferences regarding your mental health treatment. It also states that you are aware that the advance instruction authorizes a  mental health treatment provider to act according to your wishes. It may also outline your consent or refusal of mental health treatment. A declaration of an anatomical gift allows anyone over the age of 44 to make a gift by will, organ donor card or other document."   Please see the following website or an elder law attorney for forms, FAQs and for completion of advanced directives: [place] TEFL teacher Health Care Directives [facility] Care Directives ([email or web address]  Or copy and paste the following to your web browser: [email or web address]         Terressa Koyanagi, DO

## 2023-03-14 NOTE — Progress Notes (Signed)
Pt contacted coumadin clinic today and reports she has been out of town taking care of aunt. Made coumadin clinic apt for 6/18. Pt reports she is taking the same dose of warfarin as per the encounter on 1/8.

## 2023-03-14 NOTE — Telephone Encounter (Signed)
Pt reports she has been in [place] taking care of her aunt and had left her phone at home so she did not receive any of the messages.   Pt reports she is taking the same dose  as given at the anticoag encounter on 1/8. She denies any changes. She reports she may have to go out of town again. Advised she can have INR checked at a lab if needed out of town and this nurse would call with any dosing instructions. She was not aware this could be done.   Made pt a coumadin clinic apt for 6/18 at [facility] per her request.  Advised her she will also be due for yearly CPE after 8/1 with PCP. Pt said she will contact the office to schedule it.   Advised if any changes before coumadin clinic apt to contact the clinic. Pt verbalized understanding.

## 2023-03-19 ENCOUNTER — Ambulatory Visit: Payer: Medicare Other | Admitting: Podiatry

## 2023-03-20 ENCOUNTER — Ambulatory Visit (INDEPENDENT_AMBULATORY_CARE_PROVIDER_SITE_OTHER): Payer: Medicare Other

## 2023-03-20 ENCOUNTER — Other Ambulatory Visit (HOSPITAL_BASED_OUTPATIENT_CLINIC_OR_DEPARTMENT_OTHER): Payer: Self-pay

## 2023-03-20 DIAGNOSIS — Z7901 Long term (current) use of anticoagulants: Secondary | ICD-10-CM

## 2023-03-20 LAB — POCT INR: INR: 2.5 (ref 2.0–3.0)

## 2023-03-20 MED ORDER — COVID-19 MRNA VAC-TRIS(PFIZER) 30 MCG/0.3ML IM SUSY
0.3000 mL | PREFILLED_SYRINGE | Freq: Once | INTRAMUSCULAR | 0 refills | Status: AC
  Filled 2023-03-20: qty 0.3, 1d supply, fill #0

## 2023-03-20 NOTE — Patient Instructions (Addendum)
Pre visit review using our clinic review tool, if applicable. No additional management support is needed unless otherwise documented below in the visit note.  Continue 1 tablet daily except take 1/2 tablet on Mondays and Thursdays. Recheck in 6 weeks.  

## 2023-03-20 NOTE — Progress Notes (Signed)
Pt has not had INR checked since Jan 2024. Multiple attempts were made to contact pt. Pt reports she has been in [place] taking care of her aunt and had left her phone at home so she did not receive any of the messages.    Pt reports she is taking the same dose  as given at the anticoag encounter on 1/8. She denies any changes. She reports she may have to go out of town again. Advised she can have INR checked at a lab if needed out of town and this nurse would call with any dosing instructions. She was not aware this could be done.       Continue 1 tablet daily except take 1/2 tablet on Mondays and Thursdays. Recheck in 6 weeks.

## 2023-04-02 ENCOUNTER — Telehealth: Payer: Self-pay | Admitting: Cardiology

## 2023-04-02 DIAGNOSIS — I351 Nonrheumatic aortic (valve) insufficiency: Secondary | ICD-10-CM

## 2023-04-02 NOTE — Telephone Encounter (Signed)
Left voicemail to return call to office.

## 2023-04-02 NOTE — Telephone Encounter (Signed)
Patient would like to know if she can still have her echo done even though she was not able to have her 6 month follow up with you. She stated she received letter to schedule follow in June that was dated for April and when she called you are booked until after the new year. She was told they do not have schedule for January yet so she still is not able to book follow up. She stated she did not want to see an APP nor is she having any issue that requires her to see someone else. Ok to order echo?

## 2023-04-02 NOTE — Telephone Encounter (Signed)
Patient is returning call. Requesting return call.  

## 2023-04-02 NOTE — Telephone Encounter (Signed)
  Per MyChart scheduling message:  Patient is requesting orders be placed for her yearly echo so that she can have that done before she comes in for her annual follow up. Please advise.

## 2023-04-02 NOTE — Telephone Encounter (Signed)
Spoke with pt, Aware of dr crenshaw's recommendations.  °

## 2023-04-03 ENCOUNTER — Other Ambulatory Visit: Payer: Self-pay | Admitting: Internal Medicine

## 2023-04-03 DIAGNOSIS — Z7901 Long term (current) use of anticoagulants: Secondary | ICD-10-CM

## 2023-05-01 ENCOUNTER — Ambulatory Visit (INDEPENDENT_AMBULATORY_CARE_PROVIDER_SITE_OTHER): Payer: Medicare Other

## 2023-05-01 DIAGNOSIS — Z7901 Long term (current) use of anticoagulants: Secondary | ICD-10-CM

## 2023-05-01 LAB — POCT INR: INR: 1.6 — AB (ref 2.0–3.0)

## 2023-05-01 NOTE — Patient Instructions (Addendum)
Pre visit review using our clinic review tool, if applicable. No additional management support is needed unless otherwise documented below in the visit note.  Increase dose today to take 1 1/2 tablets and increase dose tomorrow to take 1 1/2 tablets and then continue 1 tablet daily except take 1/2 tablet on Mondays and Thursdays. Recheck in 3 weeks.

## 2023-05-01 NOTE — Progress Notes (Signed)
Increase dose today to take 1 1/2 tablets and increase dose tomorrow to take 1 1/2 tablets and then continue 1 tablet daily except take 1/2 tablet on Mondays and Thursdays. Recheck in 3 weeks.

## 2023-05-02 ENCOUNTER — Encounter (INDEPENDENT_AMBULATORY_CARE_PROVIDER_SITE_OTHER): Payer: Self-pay

## 2023-05-09 NOTE — Progress Notes (Unsigned)
No chief complaint on file.   HPI: Patient  [name] Foster  72 y.o. comes in today for Preventive Health Care visit  Ht cm Hld anticoagulation  Health Maintenance  Topic Date Due   COVID-19 Vaccine (5 - 2023-24 season) 06/02/2022   INFLUENZA VACCINE  05/03/2023   Zoster Vaccines- Shingrix (1 of 2) 06/08/2023 (Originally 05/29/1970)   Medicare Annual Wellness (AWV)  03/07/2024   Colonoscopy  05/10/2024   MAMMOGRAM  10/05/2024   Pneumonia Vaccine 69+ Years old  Completed   DEXA SCAN  Completed   Hepatitis C Screening  Completed   HPV VACCINES  Aged Out   DTaP/Tdap/Td  Discontinued   Health Maintenance Review LIFESTYLE:  Exercise:   Tobacco/ETS: Alcohol:  Sugar beverages: Sleep: Drug use: no HH of  Work:    ROS:  GEN/ HEENT: No fever, significant weight changes sweats headaches vision problems hearing changes, CV/ PULM; No chest pain shortness of breath cough, syncope,edema  change in exercise tolerance. GI /GU: No adominal pain, vomiting, change in bowel habits. No blood in the stool. No significant GU symptoms. SKIN/HEME: ,no acute skin rashes suspicious lesions or bleeding. No lymphadenopathy, nodules, masses.  NEURO/ PSYCH:  No neurologic signs such as weakness numbness. No depression anxiety. IMM/ Allergy: No unusual infections.  Allergy .   REST of 12 system review negative except as per HPI   Past Medical History:  Diagnosis Date   Allergic rhinitis    Anemia    Aortic valve disorders    Arthritis    Asthma    Cancer (HCC)    Breast Cancer   CHF (congestive heart failure) (HCC)    Coronary artery disease    Family history of adverse reaction to anesthesia    difficulty waking mother up after surgery   Family history of breast cancer    Family history of cervical cancer    Family history of colon cancer    Family history of throat cancer    Heart murmur    Hyperlipidemia    Hypertension    Intractable nausea and vomiting 09/21/2019   Long  term (current) use of anticoagulants    Other primary cardiomyopathies    Peripheral vascular disease (HCC)    right leg   Personal history of chemotherapy    Personal history of colonic polyps    Personal history of radiation therapy    Personal history of venous thrombosis and embolism     Past Surgical History:  Procedure Laterality Date   ABDOMINAL HYSTERECTOMY     BREAST BIOPSY Left 07/2019   CARDIAC CATHETERIZATION  2004   DILATION AND CURETTAGE OF UTERUS     several   MASTECTOMY MODIFIED RADICAL Left 03/09/2020   Left Modified Radical Mastectomy (left mastectomy with axillary lymph node dissection)    MASTECTOMY MODIFIED RADICAL Left 03/09/2020   Procedure: LEFT MODIFIED RADICAL MASTECTOMY;  Surgeon: Almond Lint, MD;  Location: [facility] OR;  Service: General;  Laterality: Left;  GEN AND PECTORAL BLOCK   PORT-A-CATH REMOVAL N/A 08/31/2020   Procedure: REMOVAL PORT-A-CATH;  Surgeon: Almond Lint, MD;  Location: [facility] OR;  Service: General;  Laterality: N/A;   PORTACATH PLACEMENT Left 08/18/2019   Procedure: INSERTION PORT-A-CATH;  Surgeon: Almond Lint, MD;  Location: [facility] OR;  Service: General;  Laterality: Left;   Rt knee arthoscopic     SHOULDER ARTHROSCOPY W/ ROTATOR CUFF REPAIR Right 07/2016   TEE WITHOUT CARDIOVERSION N/A 02/21/2013   Procedure: TRANSESOPHAGEAL ECHOCARDIOGRAM (TEE);  Surgeon: Arlys John  Ludwig Clarks, MD;  Location: [facility] ENDOSCOPY;  Service: Cardiovascular;  Laterality: N/A;    Family History  Problem Relation Age of Onset   Alcohol abuse Other    Depression Other    Hyperlipidemia Other    Hypertension Other    Kidney disease Other    Colon cancer Mother        dx late 46s   Cervical cancer Maternal Grandmother    Stroke Paternal Grandmother    Breast cancer Maternal Aunt        dx 50s   Breast cancer Cousin        dx 68s    Social History   Socioeconomic History   Marital status: Legally Separated    Spouse name: Not on file   Number of children: Not  on file   Years of education: Not on file   Highest education level: Not on file  Occupational History    Comment: retired  Tobacco Use   Smoking status: Former    Current packs/day: 0.00    Types: Cigarettes    Quit date: 07/02/2019    Years since quitting: 3.8   Smokeless tobacco: Never  Vaping Use   Vaping status: Never Used  Substance and Sexual Activity   Alcohol use: Yes    Alcohol/week: 1.0 standard drink of alcohol    Types: 1 Glasses of wine per week    Comment: rare occasion   Drug use: No   Sexual activity: Not on file    Comment: Hysterectomy  Other Topics Concern   Not on file  Social History Narrative   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    Divorced   Regular exercise- no   5 hours sleep    Lives with a son age 53    No pets         Social Determinants of Health   Financial Resource Strain: Medium Risk (03/08/2023)   Overall Financial Resource Strain (CARDIA)    Difficulty of Paying Living Expenses: Somewhat hard  Food Insecurity: Food Insecurity Present (03/08/2023)   Hunger Vital Sign    Worried About Running Out of Food in the Last Year: Sometimes true    Ran Out of Food in the Last Year: Sometimes true  Transportation Needs: Unmet Transportation Needs (03/08/2023)   PRAPARE - Administrator, Civil Service (Medical): Yes    Lack of Transportation (Non-Medical): Yes  Physical Activity: Insufficiently Active (03/08/2023)   Exercise Vital Sign    Days of Exercise per Week: 3 days    Minutes of Exercise per Session: 30 min  Stress: No Stress Concern Present (03/08/2023)   [employer] of Occupational Health - Occupational Stress Questionnaire    Feeling of Stress : Not at all  Social Connections: Unknown (03/08/2023)   Social Connection and Isolation Panel [NHANES]    Frequency of Communication with Friends and Family: Once a week    Frequency of Social Gatherings with Friends and Family: Never    Attends Religious Services: Patient  unable to answer    Active Member of Clubs or Organizations: No    Attends Banker Meetings: Never    Marital Status: Separated    Outpatient Medications Prior to Visit  Medication Sig Dispense Refill   Albuterol Sulfate (PROAIR RESPICLICK) 108 (90 Base) MCG/ACT AEPB Inhale 2 puffs into the lungs every 6 (six) hours as needed. 2 each 1   amLODipine (NORVASC) 10 MG tablet TAKE 1 TABLET BY  MOUTH DAILY 100 tablet 2   atorvastatin (LIPITOR) 20 MG tablet TAKE 1 TABLET BY MOUTH ONCE  DAILY 100 tablet 2   Carboxymethylcellul-Glycerin (CLEAR EYES FOR DRY EYES) 1-0.25 % SOLN Place 1 drop into both eyes 3 (three) times daily as needed (dry eyes).      Cholecalciferol (VITAMIN D) 50 MCG (2000 UT) tablet Take 2,000 Units by mouth daily.     fexofenadine (ALLEGRA) 180 MG tablet Take 180 mg by mouth daily as needed for allergies.      fluticasone (FLONASE) 50 MCG/ACT nasal spray USE 2 SPRAYS IN EACH NOSTRIL DAILY. PT NEEDS TO SCHEDULE A FOLLOW UP APPT BEFORE NEXT REFILL. (Patient taking differently: Place 2 sprays into both nostrils daily.) 16 g 0   furosemide (LASIX) 40 MG tablet Take 0.5 tablets (20 mg total) by mouth daily. 90 tablet 3   hydrALAZINE (APRESOLINE) 50 MG tablet TAKE 1 TABLET BY MOUTH 3 TIMES  DAILY 300 tablet 2   metoprolol tartrate (LOPRESSOR) 50 MG tablet TAKE 1 TABLET BY MOUTH TWICE  DAILY 200 tablet 2   sodium chloride (OCEAN) 0.65 % nasal spray Place into the nose.     warfarin (COUMADIN) 5 MG tablet TAKE 1 TABLET BY MOUTH DAILY  EXCEPT TAKE 1 AND 1/2 TABLETS BY MOUTH ON MONDAYS OR AS DIRECTED  BY ANTICOAGULATION CLINIC 122 tablet 2   No facility-administered medications prior to visit.     EXAM:  LMP  (LMP Unknown)   There is no height or weight on file to calculate BMI. Wt Readings from Last 3 Encounters:  11/16/22 218 lb (98.9 kg)  11/01/22 218 lb (98.9 kg)  05/02/22 228 lb 6.4 oz (103.6 kg)    Physical Exam: Vital signs reviewed WGN:FAOZ is a  well-developed well-nourished alert cooperative    who appearsr stated age in no acute distress.  HEENT: normocephalic atraumatic , Eyes: PERRL EOM's full, conjunctiva clear, Nares: paten,t no deformity discharge or tenderness., Ears: no deformity EAC's clear TMs with normal landmarks. Mouth: clear OP, no lesions, edema.  Moist mucous membranes. Dentition in adequate repair. NECK: supple without masses, thyromegaly or bruits. CHEST/PULM:  Clear to auscultation and percussion breath sounds equal no wheeze , rales or rhonchi. No chest wall deformities or tenderness. Breast: normal by inspection . No dimpling, discharge, masses, tenderness or discharge . CV: PMI is nondisplaced, S1 S2 no gallops, murmurs, rubs. Peripheral pulses are full without delay.No JVD .  ABDOMEN: Bowel sounds normal nontender  No guard or rebound, no hepato splenomegal no CVA tenderness.  No hernia. Extremtities:  No clubbing cyanosis or edema, no acute joint swelling or redness no focal atrophy NEURO:  Oriented x3, cranial nerves 3-12 appear to be intact, no obvious focal weakness,gait within normal limits no abnormal reflexes or asymmetrical SKIN: No acute rashes normal turgor, color, no bruising or petechiae. PSYCH: Oriented, good eye contact, no obvious depression anxiety, cognition and judgment appear normal. LN: no cervical axillary inguinal adenopathy  Lab Results  Component Value Date   WBC 5.3 05/02/2022   HGB 12.2 05/02/2022   HCT 37.9 05/02/2022   PLT 243.0 05/02/2022   GLUCOSE 95 07/24/2022   CHOL 149 05/02/2022   TRIG 122.0 05/02/2022   HDL 42.10 05/02/2022   LDLDIRECT 175.9 07/01/2007   LDLCALC 82 05/02/2022   ALT 17 07/24/2022   AST 23 07/24/2022   NA 142 07/24/2022   K 3.2 (L) 07/24/2022   CL 103 07/24/2022   CREATININE 1.85 (H) 07/24/2022  BUN 21 07/24/2022   CO2 24 07/24/2022   TSH 2.15 05/02/2022   INR 1.6 (A) 05/01/2023   HGBA1C 6.4 07/24/2022   MICROALBUR <0.7 07/24/2022    BP Readings  from Last 3 Encounters:  11/16/22 (!) 145/67  11/01/22 132/70  05/02/22 126/70    Lab results reviewed with patient   ASSESSMENT AND PLAN:  Discussed the following assessment and plan:    ICD-10-CM   1. Cardiomyopathy due to hypertension, without heart failure (HCC)  I11.9    I43     2. Medication management  Z79.899      No follow-ups on file.  Patient Care Team: , Neta Mends, MD as PCP - General (Internal Medicine) Jens Som Madolyn Frieze, MD as PCP - Cardiology (Cardiology) Jens Som Madolyn Frieze, MD (Cardiology) Jene Every, MD (Orthopedic Surgery) Pershing Proud, RN as Oncology Nurse Navigator Donnelly Angelica, RN as Oncology Nurse Navigator Serena Croissant, MD as Consulting Physician (Hematology and Oncology) There are no Patient Instructions on file for this visit.  Neta Mends.  M.D.

## 2023-05-10 ENCOUNTER — Ambulatory Visit: Payer: Medicare Other | Admitting: Internal Medicine

## 2023-05-10 ENCOUNTER — Encounter: Payer: Self-pay | Admitting: Internal Medicine

## 2023-05-10 VITALS — BP 134/68 | HR 57 | Temp 98.1°F | Ht 65.8 in | Wt 219.6 lb

## 2023-05-10 DIAGNOSIS — C773 Secondary and unspecified malignant neoplasm of axilla and upper limb lymph nodes: Secondary | ICD-10-CM | POA: Diagnosis not present

## 2023-05-10 DIAGNOSIS — I43 Cardiomyopathy in diseases classified elsewhere: Secondary | ICD-10-CM | POA: Diagnosis not present

## 2023-05-10 DIAGNOSIS — I119 Hypertensive heart disease without heart failure: Secondary | ICD-10-CM | POA: Diagnosis not present

## 2023-05-10 DIAGNOSIS — E1165 Type 2 diabetes mellitus with hyperglycemia: Secondary | ICD-10-CM | POA: Diagnosis not present

## 2023-05-10 DIAGNOSIS — C50912 Malignant neoplasm of unspecified site of left female breast: Secondary | ICD-10-CM

## 2023-05-10 DIAGNOSIS — Z0000 Encounter for general adult medical examination without abnormal findings: Secondary | ICD-10-CM

## 2023-05-10 DIAGNOSIS — N183 Chronic kidney disease, stage 3 unspecified: Secondary | ICD-10-CM | POA: Diagnosis not present

## 2023-05-10 DIAGNOSIS — Z79899 Other long term (current) drug therapy: Secondary | ICD-10-CM

## 2023-05-10 DIAGNOSIS — E785 Hyperlipidemia, unspecified: Secondary | ICD-10-CM | POA: Diagnosis not present

## 2023-05-10 LAB — CBC WITH DIFFERENTIAL/PLATELET
Basophils Absolute: 0 10*3/uL (ref 0.0–0.1)
Basophils Relative: 0.5 % (ref 0.0–3.0)
Eosinophils Absolute: 0 10*3/uL (ref 0.0–0.7)
Eosinophils Relative: 0.5 % (ref 0.0–5.0)
HCT: 47.6 % — ABNORMAL HIGH (ref 36.0–46.0)
Hemoglobin: 15 g/dL (ref 12.0–15.0)
Lymphocytes Relative: 39.3 % (ref 12.0–46.0)
Lymphs Abs: 3.1 10*3/uL (ref 0.7–4.0)
MCHC: 31.5 g/dL (ref 30.0–36.0)
MCV: 89.8 fl (ref 78.0–100.0)
Monocytes Absolute: 0.3 10*3/uL (ref 0.1–1.0)
Monocytes Relative: 3.9 % (ref 3.0–12.0)
Neutro Abs: 4.4 10*3/uL (ref 1.4–7.7)
Neutrophils Relative %: 55.8 % (ref 43.0–77.0)
Platelets: 146 10*3/uL — ABNORMAL LOW (ref 150.0–400.0)
RBC: 5.31 Mil/uL — ABNORMAL HIGH (ref 3.87–5.11)
RDW: 14.4 % (ref 11.5–15.5)
WBC: 7.8 10*3/uL (ref 4.0–10.5)

## 2023-05-10 LAB — COMPREHENSIVE METABOLIC PANEL
ALT: 15 U/L (ref 0–35)
AST: 23 U/L (ref 0–37)
Albumin: 4.7 g/dL (ref 3.5–5.2)
Alkaline Phosphatase: 127 U/L — ABNORMAL HIGH (ref 39–117)
BUN: 19 mg/dL (ref 6–23)
CO2: 26 mEq/L (ref 19–32)
Calcium: 9.8 mg/dL (ref 8.4–10.5)
Chloride: 106 mEq/L (ref 96–112)
Creatinine, Ser: 1.57 mg/dL — ABNORMAL HIGH (ref 0.40–1.20)
GFR: 33.12 mL/min — ABNORMAL LOW (ref >60.00)
Glucose, Bld: 89 mg/dL (ref 70–99)
Potassium: 3.6 mEq/L (ref 3.5–5.1)
Sodium: 143 mEq/L (ref 135–145)
Total Bilirubin: 0.5 mg/dL (ref 0.2–1.2)
Total Protein: 8.4 g/dL — ABNORMAL HIGH (ref 6.0–8.3)

## 2023-05-10 LAB — MICROALBUMIN / CREATININE URINE RATIO
Creatinine,U: 176.6 mg/dL
Microalb Creat Ratio: 3.1 mg/g (ref 0.0–30.0)
Microalb, Ur: 5.4 mg/dL — ABNORMAL HIGH (ref 0.0–1.9)

## 2023-05-10 LAB — LIPID PANEL
Cholesterol: 159 mg/dL (ref 0–200)
HDL: 56.4 mg/dL (ref >39.00)
LDL Cholesterol: 81 mg/dL (ref 0–99)
NonHDL: 102.64
Total CHOL/HDL Ratio: 3
Triglycerides: 110 mg/dL (ref 0.0–149.0)
VLDL: 22 mg/dL (ref 0.0–40.0)

## 2023-05-10 LAB — TSH: TSH: 1.84 u[IU]/mL (ref 0.35–5.50)

## 2023-05-10 LAB — HEMOGLOBIN A1C: Hgb A1c MFr Bld: 6.1 % (ref 4.6–6.5)

## 2023-05-10 NOTE — Patient Instructions (Signed)
Good to see you today  Will share  lab results with medical team . Shingles vaccine when convenient  Keep fu with cardiology and  oncology  If all o then yearly exam visit

## 2023-05-18 ENCOUNTER — Ambulatory Visit (INDEPENDENT_AMBULATORY_CARE_PROVIDER_SITE_OTHER): Payer: Medicare Other

## 2023-05-18 DIAGNOSIS — Z7901 Long term (current) use of anticoagulants: Secondary | ICD-10-CM | POA: Diagnosis not present

## 2023-05-18 LAB — POCT INR: INR: 1.7 — AB (ref 2.0–3.0)

## 2023-05-18 NOTE — Progress Notes (Signed)
Increase dose today to take 1 1/2 tablets and then change weekly dose to take 1 tablet daily except take 1/2 tablet on Mondays. Recheck in 4 weeks per pt request due to being out of town.

## 2023-05-18 NOTE — Patient Instructions (Addendum)
Pre visit review using our clinic review tool, if applicable. No additional management support is needed unless otherwise documented below in the visit note.  Increase dose today to take 1 1/2 tablets and then change weekly dose to take 1 tablet daily except take 1/2 tablet on Mondays. Recheck in 4 weeks

## 2023-05-22 ENCOUNTER — Ambulatory Visit: Payer: Medicare Other

## 2023-05-30 ENCOUNTER — Ambulatory Visit (HOSPITAL_COMMUNITY): Payer: Medicare Other | Attending: Cardiology

## 2023-05-30 DIAGNOSIS — I351 Nonrheumatic aortic (valve) insufficiency: Secondary | ICD-10-CM | POA: Insufficient documentation

## 2023-05-30 LAB — ECHOCARDIOGRAM COMPLETE
AR max vel: 1.45 cm2
AV Area VTI: 1.38 cm2
AV Area mean vel: 1.44 cm2
AV Mean grad: 8.7 mmHg
AV Peak grad: 17.6 mmHg
Ao pk vel: 2.1 m/s
Area-P 1/2: 3.65 cm2
P 1/2 time: 527 msec
S' Lateral: 3.5 cm

## 2023-06-01 ENCOUNTER — Other Ambulatory Visit (HOSPITAL_COMMUNITY): Payer: Medicare Other

## 2023-06-11 DIAGNOSIS — I890 Lymphedema, not elsewhere classified: Secondary | ICD-10-CM | POA: Diagnosis not present

## 2023-06-11 DIAGNOSIS — C50812 Malignant neoplasm of overlapping sites of left female breast: Secondary | ICD-10-CM | POA: Diagnosis not present

## 2023-06-11 DIAGNOSIS — Z171 Estrogen receptor negative status [ER-]: Secondary | ICD-10-CM | POA: Diagnosis not present

## 2023-06-15 ENCOUNTER — Ambulatory Visit (INDEPENDENT_AMBULATORY_CARE_PROVIDER_SITE_OTHER): Payer: Medicare Other

## 2023-06-15 DIAGNOSIS — Z7901 Long term (current) use of anticoagulants: Secondary | ICD-10-CM | POA: Diagnosis not present

## 2023-06-15 DIAGNOSIS — Z23 Encounter for immunization: Secondary | ICD-10-CM

## 2023-06-15 LAB — POCT INR: INR: 2.1 (ref 2.0–3.0)

## 2023-06-15 NOTE — Patient Instructions (Addendum)
Pre visit review using our clinic review tool, if applicable. No additional management support is needed unless otherwise documented below in the visit note.  Continue 1 tablet daily except take 1/2 tablet on Mondays.  Re-check in 6  weeks.

## 2023-06-15 NOTE — Progress Notes (Addendum)
Continue 1 tablet daily except take 1/2 tablet on Mondays. Recheck in 6 weeks.   Pt requested high dose flu vaccine. Administered high dose vaccine in R deltoid. Pt tolerated well.

## 2023-06-18 ENCOUNTER — Ambulatory Visit: Payer: Medicare Other | Admitting: Podiatry

## 2023-06-25 NOTE — Progress Notes (Signed)
Gfr still decreased but slightly  better;  rest of labs about the same . The gfr should be followed and I see that urology follows the kidney mass   . I suggest the gfr bmp be repeated in 4-6 months if not done bu one of your specialists . ( Please place order for bmp in 4-6 months and patient can let us know if already done by others)

## 2023-06-27 ENCOUNTER — Other Ambulatory Visit: Payer: Self-pay | Admitting: Cardiology

## 2023-07-16 NOTE — Progress Notes (Signed)
HPI: FU nonischemic cardiomyopathy and aortic insufficiency. Previous catheterization in 2004 showed no obstructive coronary disease. CT of her chest in Nov 2008 showed no thoracic aneurysm. Previous Holter monitor secondary to "dizzy spells" showed PACs and PVCs. Nuclear study April 2014 showed an ejection fraction of 62% and normal perfusion. Patient underwent transesophageal echocardiogram in May of 2014. Her ejection fraction was 50-55%. There was moderate aortic insufficiency and mild mitral regurgitation. There was a linear density associated with the atrial septum of uncertain etiology. Patient has had occasional noncompliance in the past with medications. If blood pressure not controlled LV function noted to be reduced with worsening aortic insufficiency. Her ACE inhibitor was discontinued previously because of dehydration/renal insufficiency.  Echocardiogram August 2024 showed normal LV function, grade 1 diastolic dysfunction, moderate to severe aortic insufficiency.   Since I last saw her, the patient has dyspnea with more extreme activities but not with routine activities. It is relieved with rest. It is not associated with chest pain. There is no orthopnea, PND or pedal edema. There is no syncope or palpitations. There is no exertional chest pain.   Current Outpatient Medications  Medication Sig Dispense Refill   Albuterol Sulfate (PROAIR RESPICLICK) 108 (90 Base) MCG/ACT AEPB Inhale 2 puffs into the lungs every 6 (six) hours as needed. 2 each 1   amLODipine (NORVASC) 10 MG tablet Take 1 tablet (10 mg total) by mouth daily. Please keep scheduled appointment for future refills. Thank you. 90 tablet 0   atorvastatin (LIPITOR) 20 MG tablet Take 1 tablet (20 mg total) by mouth daily. Please keep scheduled appointment for future refills. Thank you. 90 tablet 0   Carboxymethylcellul-Glycerin (CLEAR EYES FOR DRY EYES) 1-0.25 % SOLN Place 1 drop into both eyes 3 (three) times daily as needed (dry  eyes).      Cholecalciferol (VITAMIN D) 50 MCG (2000 UT) tablet Take 2,000 Units by mouth daily.     fexofenadine (ALLEGRA) 180 MG tablet Take 180 mg by mouth daily as needed for allergies.      fluticasone (FLONASE) 50 MCG/ACT nasal spray USE 2 SPRAYS IN EACH NOSTRIL DAILY. PT NEEDS TO SCHEDULE A FOLLOW UP APPT BEFORE NEXT REFILL. (Patient taking differently: Place 2 sprays into both nostrils daily.) 16 g 0   hydrALAZINE (APRESOLINE) 50 MG tablet Take 1 tablet (50 mg total) by mouth 3 (three) times daily. Please keep scheduled appointment for future refills. Thank you. 270 tablet 0   metoprolol tartrate (LOPRESSOR) 50 MG tablet Take 1 tablet (50 mg total) by mouth 2 (two) times daily. Please keep scheduled appointment for future refills. Thank you. 180 tablet 0   sodium chloride (OCEAN) 0.65 % nasal spray Place into the nose as needed.     warfarin (COUMADIN) 5 MG tablet TAKE 1 TABLET BY MOUTH DAILY  EXCEPT TAKE 1 AND 1/2 TABLETS BY MOUTH ON MONDAYS OR AS DIRECTED  BY ANTICOAGULATION CLINIC 122 tablet 2   furosemide (LASIX) 40 MG tablet Take 0.5 tablets (20 mg total) by mouth daily. 90 tablet 3   No current facility-administered medications for this visit.     Past Medical History:  Diagnosis Date   Allergic rhinitis    Anemia    Aortic valve disorders    Arthritis    Asthma    Cancer (HCC)    Breast Cancer   CHF (congestive heart failure) (HCC)    Coronary artery disease    Family history of adverse reaction to anesthesia  difficulty waking mother up after surgery   Family history of breast cancer    Family history of cervical cancer    Family history of colon cancer    Family history of throat cancer    Heart murmur    Hyperlipidemia    Hypertension    Intractable nausea and vomiting 09/21/2019   Long term (current) use of anticoagulants    Other primary cardiomyopathies    Peripheral vascular disease (HCC)    right leg   Personal history of chemotherapy    Personal  history of colonic polyps    Personal history of radiation therapy    Personal history of venous thrombosis and embolism     Past Surgical History:  Procedure Laterality Date   ABDOMINAL HYSTERECTOMY     BREAST BIOPSY Left 07/2019   CARDIAC CATHETERIZATION  2004   DILATION AND CURETTAGE OF UTERUS     several   MASTECTOMY MODIFIED RADICAL Left 03/09/2020   Left Modified Radical Mastectomy (left mastectomy with axillary lymph node dissection)    MASTECTOMY MODIFIED RADICAL Left 03/09/2020   Procedure: LEFT MODIFIED RADICAL MASTECTOMY;  Surgeon: Almond Lint, MD;  Location: [facility] OR;  Service: General;  Laterality: Left;  GEN AND PECTORAL BLOCK   PORT-A-CATH REMOVAL N/A 08/31/2020   Procedure: REMOVAL PORT-A-CATH;  Surgeon: Almond Lint, MD;  Location: [facility] OR;  Service: General;  Laterality: N/A;   PORTACATH PLACEMENT Left 08/18/2019   Procedure: INSERTION PORT-A-CATH;  Surgeon: Almond Lint, MD;  Location: [facility] OR;  Service: General;  Laterality: Left;   Rt knee arthoscopic     SHOULDER ARTHROSCOPY W/ ROTATOR CUFF REPAIR Right 07/2016   TEE WITHOUT CARDIOVERSION N/A 02/21/2013   Procedure: TRANSESOPHAGEAL ECHOCARDIOGRAM (TEE);  Surgeon: Lewayne Bunting, MD;  Location[facility] ENDOSCOPY;  Service: Cardiovascular;  Laterality: N/A;    Social History   Socioeconomic History   Marital status: Legally Separated    Spouse name: Not on file   Number of children: Not on file   Years of education: Not on file   Highest education level: Not on file  Occupational History    Comment: retired  Tobacco Use   Smoking status: Former    Current packs/day: 0.00    Types: Cigarettes    Quit date: 07/02/2019    Years since quitting: 4.0   Smokeless tobacco: Never  Vaping Use   Vaping status: Never Used  Substance and Sexual Activity   Alcohol use: Yes    Alcohol/week: 1.0 standard drink of alcohol    Types: 1 Glasses of wine per week    Comment: rare occasion   Drug use: No   Sexual activity:  Not on file    Comment: Hysterectomy  Other Topics Concern   Not on file  Social History Narrative   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    Divorced   Regular exercise- no   5 hours sleep    Lives with a son age 64    No pets         Social Determinants of Health   Financial Resource Strain: Medium Risk (03/08/2023)   Overall Financial Resource Strain (CARDIA)    Difficulty of Paying Living Expenses: Somewhat hard  Food Insecurity: Food Insecurity Present (03/08/2023)   Hunger Vital Sign    Worried About Running Out of Food in the Last Year: Sometimes true    Ran Out of Food in the Last Year: Sometimes true  Transportation Needs: Unmet Transportation Needs (03/08/2023)  PRAPARE - Administrator, Civil Service (Medical): Yes    Lack of Transportation (Non-Medical): Yes  Physical Activity: Insufficiently Active (03/08/2023)   Exercise Vital Sign    Days of Exercise per Week: 3 days    Minutes of Exercise per Session: 30 min  Stress: No Stress Concern Present (03/08/2023)   [employer] of Occupational Health - Occupational Stress Questionnaire    Feeling of Stress : Not at all  Social Connections: Unknown (03/08/2023)   Social Connection and Isolation Panel [NHANES]    Frequency of Communication with Friends and Family: Once a week    Frequency of Social Gatherings with Friends and Family: Never    Attends Religious Services: Patient unable to answer    Active Member of Clubs or Organizations: No    Attends Banker Meetings: Never    Marital Status: Separated  Intimate Partner Violence: Not At Risk (03/01/2021)   Humiliation, Afraid, Rape, and Kick questionnaire    Fear of Current or Ex-Partner: No    Emotionally Abused: No    Physically Abused: No    Sexually Abused: No    Family History  Problem Relation Age of Onset   Alcohol abuse Other    Depression Other    Hyperlipidemia Other    Hypertension Other    Kidney disease Other     Colon cancer Mother        dx late 14s   Cervical cancer Maternal Grandmother    Stroke Paternal Grandmother    Breast cancer Maternal Aunt        dx 20s   Breast cancer Cousin        dx 50s    ROS: no fevers or chills, productive cough, hemoptysis, dysphasia, odynophagia, melena, hematochezia, dysuria, hematuria, rash, seizure activity, orthopnea, PND, pedal edema, claudication. Remaining systems are negative.  Physical Exam: Well-developed well-nourished in no acute distress.  Skin is warm and dry.  HEENT is normal.  Neck is supple.  Chest is clear to auscultation with normal expansion.  Cardiovascular exam is regular rate and rhythm.  2/6 systolic murmur left sternal border. Abdominal exam nontender or distended. No masses palpated. Extremities show no edema. neuro grossly intact  A/P  1 aortic insufficiency-moderate to severe on most recent echocardiogram.  However LV function is normal and no LV enlargement is noted.  She remains symptomatically unchanged.  Plan to repeat echocardiogram August 2025.  She understands she may require aortic valve replacement in the future.  2 history of nonischemic cardiomyopathy-LV function normalized on most recent study.  3 hypertension-blood pressure controlled.  Continue present medications.  4 recurrent DVTs-continue Coumadin.  This issue is managed by primary care.  5 hyperlipidemia-continue statin.  6 chronic stage III kidney disease  Olga Millers, MD

## 2023-07-27 ENCOUNTER — Telehealth: Payer: Self-pay

## 2023-07-27 ENCOUNTER — Ambulatory Visit: Payer: Medicare Other

## 2023-07-27 NOTE — Telephone Encounter (Signed)
Pt reports she would appreciate if she could cancel her coumadin clinic apt today due to her having to pay Benedetto Goad, and request her INR be checked at her cardiology apt on 10/28. Advised a msg would be sent to cardiology requesting testing while at that apt and this nurse will f/u with results on 10/28 or 10/29, when results are received. Pt very appreciative and verbalized understanding.

## 2023-07-30 ENCOUNTER — Ambulatory Visit: Payer: Medicare Other | Attending: Cardiology | Admitting: Cardiology

## 2023-07-30 ENCOUNTER — Encounter: Payer: Self-pay | Admitting: Cardiology

## 2023-07-30 VITALS — BP 134/80 | HR 53 | Ht 68.0 in | Wt 226.6 lb

## 2023-07-30 DIAGNOSIS — E7800 Pure hypercholesterolemia, unspecified: Secondary | ICD-10-CM | POA: Diagnosis not present

## 2023-07-30 DIAGNOSIS — I428 Other cardiomyopathies: Secondary | ICD-10-CM | POA: Diagnosis not present

## 2023-07-30 DIAGNOSIS — I10 Essential (primary) hypertension: Secondary | ICD-10-CM | POA: Diagnosis not present

## 2023-07-30 DIAGNOSIS — I351 Nonrheumatic aortic (valve) insufficiency: Secondary | ICD-10-CM

## 2023-07-30 NOTE — Patient Instructions (Signed)

## 2023-07-31 ENCOUNTER — Ambulatory Visit (INDEPENDENT_AMBULATORY_CARE_PROVIDER_SITE_OTHER): Payer: Medicare Other

## 2023-07-31 DIAGNOSIS — Z7901 Long term (current) use of anticoagulants: Secondary | ICD-10-CM

## 2023-07-31 LAB — PROTIME-INR
INR: 1.6 — ABNORMAL HIGH (ref 0.9–1.2)
Prothrombin Time: 17.7 s — ABNORMAL HIGH (ref 9.1–12.0)

## 2023-07-31 NOTE — Patient Instructions (Addendum)
Pre visit review using our clinic review tool, if applicable. No additional management support is needed unless otherwise documented below in the visit note.  Increase dose today to take 1 1/2 tablets and increase dose tomorrow to take 1 1/2 tablets and then continue 1 tablet daily except take 1/2 tablet on Mondays. Recheck in 3 weeks.

## 2023-07-31 NOTE — Progress Notes (Signed)
Pt had lab draw INR at cardiology apt yesterday. Received result today.  Increase dose today to take 1 1/2 tablets and increase dose tomorrow to take 1 1/2 tablets and then continue 1 tablet daily except take 1/2 tablet on Mondays. Recheck in 3 weeks.

## 2023-08-21 ENCOUNTER — Ambulatory Visit (INDEPENDENT_AMBULATORY_CARE_PROVIDER_SITE_OTHER): Payer: Medicare Other

## 2023-08-21 DIAGNOSIS — Z7901 Long term (current) use of anticoagulants: Secondary | ICD-10-CM | POA: Diagnosis not present

## 2023-08-21 LAB — POCT INR: INR: 2 (ref 2.0–3.0)

## 2023-08-21 NOTE — Patient Instructions (Addendum)
Pre visit review using our clinic review tool, if applicable. No additional management support is needed unless otherwise documented below in the visit note.  Change weekly dose to take 1 tablet. Recheck in 4 weeks.

## 2023-08-21 NOTE — Progress Notes (Signed)
Pt has had subtherapeutic INRs for the last 2 INR checks and is on the bottom edge of her range today. Will make small weekly change to try to move INR closer to the middle of the INR range.  Change weekly dose to take 1 tablet. Recheck in 4 weeks.

## 2023-08-27 ENCOUNTER — Telehealth: Payer: Self-pay

## 2023-08-27 NOTE — Patient Outreach (Signed)
Attempted to contact patient regarding caring gaps. Left voicemail for patient to return my call at [phone].  Nicholes Rough, CMA Care Guide [employer]

## 2023-09-10 ENCOUNTER — Ambulatory Visit: Payer: Medicare Other | Admitting: Podiatry

## 2023-09-13 ENCOUNTER — Encounter: Payer: Self-pay | Admitting: Internal Medicine

## 2023-09-13 DIAGNOSIS — H25813 Combined forms of age-related cataract, bilateral: Secondary | ICD-10-CM | POA: Diagnosis not present

## 2023-09-17 ENCOUNTER — Ambulatory Visit (INDEPENDENT_AMBULATORY_CARE_PROVIDER_SITE_OTHER): Payer: Medicare Other

## 2023-09-17 ENCOUNTER — Other Ambulatory Visit: Payer: Self-pay | Admitting: Hematology and Oncology

## 2023-09-17 DIAGNOSIS — Z7901 Long term (current) use of anticoagulants: Secondary | ICD-10-CM | POA: Diagnosis not present

## 2023-09-17 DIAGNOSIS — Z1231 Encounter for screening mammogram for malignant neoplasm of breast: Secondary | ICD-10-CM

## 2023-09-17 LAB — POCT INR: INR: 2 (ref 2.0–3.0)

## 2023-09-17 NOTE — Patient Instructions (Addendum)
Pre visit review using our clinic review tool, if applicable. No additional management support is needed unless otherwise documented below in the visit note.  Continue 1 tablet daily. Recheck in 6 weeks 

## 2023-09-17 NOTE — Progress Notes (Signed)
Pt has had subtherapeutic INRs or at the bottom of her range for the last 3 INR checks and is on the bottom edge of her range today.  So a small weekly change was made at her last coumadin clinic apt to try to move INR closer to the middle of the INR range.  She is still on the lowest end of her range but will leave dosing the same and recheck in 6 weeks. Advised if still on the lowest side of her INR range at next apt there may be another dose change made. Continue 1 tablet daily. Recheck in 6 weeks.

## 2023-09-18 ENCOUNTER — Ambulatory Visit: Payer: Medicare Other

## 2023-10-01 ENCOUNTER — Telehealth: Payer: Self-pay | Admitting: Hematology and Oncology

## 2023-10-01 NOTE — Telephone Encounter (Signed)
Patient is aware of rescheduled appointment times/dates due to provider being out of office

## 2023-10-10 ENCOUNTER — Other Ambulatory Visit: Payer: Self-pay | Admitting: Cardiology

## 2023-10-11 ENCOUNTER — Ambulatory Visit
Admission: RE | Admit: 2023-10-11 | Discharge: 2023-10-11 | Disposition: A | Discharge status: Home / Self Care | Payer: Medicare Other | Source: Ambulatory Visit | Attending: Hematology and Oncology | Admitting: Hematology and Oncology

## 2023-10-11 DIAGNOSIS — Z1231 Encounter for screening mammogram for malignant neoplasm of breast: Secondary | ICD-10-CM

## 2023-10-30 ENCOUNTER — Ambulatory Visit (INDEPENDENT_AMBULATORY_CARE_PROVIDER_SITE_OTHER): Payer: Medicare Other

## 2023-10-30 DIAGNOSIS — Z7901 Long term (current) use of anticoagulants: Secondary | ICD-10-CM

## 2023-10-30 LAB — POCT INR: INR: 1.9 — AB (ref 2.0–3.0)

## 2023-10-30 NOTE — Patient Instructions (Addendum)
Pre visit review using our clinic review tool, if applicable. No additional management support is needed unless otherwise documented below in the visit note.  Increase dose today to take 1 1/2 tablets and then change weekly dose to take 1 tablet daily except take 1 1/2 tablets on Mondays. Recheck in 4 weeks.

## 2023-10-30 NOTE — Progress Notes (Signed)
Pt has had subtherapeutic INRs or at the bottom of her range for the last 3 INR checks and is on the bottom edge of her range today.  So a small weekly change was made at her last coumadin clinic apt to try to move INR closer to the middle of the INR range.  Increase dose today to take 1 1/2 tablets and then change weekly dose to take 1 tablet daily except take 1 1/2 tablets on Mondays. Recheck in 4 weeks.

## 2023-11-13 ENCOUNTER — Telehealth: Payer: Self-pay

## 2023-11-13 ENCOUNTER — Other Ambulatory Visit: Payer: Self-pay | Admitting: *Deleted

## 2023-11-13 DIAGNOSIS — Z7901 Long term (current) use of anticoagulants: Secondary | ICD-10-CM

## 2023-11-13 NOTE — Telephone Encounter (Signed)
Pt requesting to have INR checked at oncology apt on 2/18 and cancel her coumadin clinic apt for 8/25 to save her an extra trip.  Advised this nurse will send a msg to oncology requesting INR lab draw but for her also to remind them it is needed when labs are drawn. Advised this nurse will f/u with her with dosing instructions when result is received. Pt verbalized  understanding.

## 2023-11-19 ENCOUNTER — Ambulatory Visit: Payer: Medicare Other | Admitting: Hematology and Oncology

## 2023-11-19 NOTE — Assessment & Plan Note (Signed)
07/31/2020:Patient palpated left breast lump and skin thickening. Mammogram showed a 3.6cm mass at 3:30 position, a 0.9cm mass at 3:00 position, a 0.7cm mass at 2:00 position, a 0.5cm mass at 2:00 position, a 0.3cm mass at 1:00 position, and a 0.4cm mass at 1:00 position, with 4 abnormal left axillary lymph nodes. Biopsy showed IDC, grade 3, HER-2 + (3+), ER/PR -, Ki67 70%, in the breast and lymph nodes.    Treatment plan  1. Neoadjuvant chemotherapy with TCH Perjeta 6 cycles completed 01/02/2020 followed by Herceptin maintenance completed 08/19/20  2. 03/09/2020: Left mastectomy and left axillary lymph node dissection ([place]): no residual carcinoma, 2 negative intramammary lymph nodes, and 19 negative left axillary lymph nodes. 3. Followed by adjuvant radiation therapy completed 07/12/2020  --------------------------------------------------------------------------------------------------------------------------------------------- Left arm lymphedema: She is waiting for a lymphedema pump to arrive.  She is still working with physical therapy.    Breast Cancer Surveillance: 10/12/2023: right breast mammogram: Benign, density A 11/20/2023: Breast exam: Benign 10/06/2022: Bone density: T-score -1.4: Osteopenia: Calcium vitamin D and weightbearing exercises    Return to clinic in 1 year for surveillance check and follow-up.

## 2023-11-20 ENCOUNTER — Inpatient Hospital Stay: Payer: Medicare Other

## 2023-11-20 ENCOUNTER — Ambulatory Visit (INDEPENDENT_AMBULATORY_CARE_PROVIDER_SITE_OTHER): Payer: Medicare Other

## 2023-11-20 ENCOUNTER — Inpatient Hospital Stay: Payer: Medicare Other | Attending: Hematology and Oncology | Admitting: Hematology and Oncology

## 2023-11-20 VITALS — BP 137/67 | HR 58 | Temp 97.7°F | Resp 20 | Ht 68.0 in | Wt 225.8 lb

## 2023-11-20 DIAGNOSIS — Z1731 Human epidermal growth factor receptor 2 positive status: Secondary | ICD-10-CM | POA: Insufficient documentation

## 2023-11-20 DIAGNOSIS — Z7901 Long term (current) use of anticoagulants: Secondary | ICD-10-CM

## 2023-11-20 DIAGNOSIS — Z923 Personal history of irradiation: Secondary | ICD-10-CM | POA: Insufficient documentation

## 2023-11-20 DIAGNOSIS — Z171 Estrogen receptor negative status [ER-]: Secondary | ICD-10-CM | POA: Insufficient documentation

## 2023-11-20 DIAGNOSIS — Z1722 Progesterone receptor negative status: Secondary | ICD-10-CM | POA: Diagnosis not present

## 2023-11-20 DIAGNOSIS — C50812 Malignant neoplasm of overlapping sites of left female breast: Secondary | ICD-10-CM | POA: Insufficient documentation

## 2023-11-20 DIAGNOSIS — Z9012 Acquired absence of left breast and nipple: Secondary | ICD-10-CM | POA: Insufficient documentation

## 2023-11-20 LAB — PROTIME-INR
INR: 1.8 — ABNORMAL HIGH (ref 0.8–1.2)
Prothrombin Time: 21.5 s — ABNORMAL HIGH (ref 11.4–15.2)

## 2023-11-20 NOTE — Progress Notes (Signed)
Pt had apt with oncology today and had lab INR draw.  Increase dose today to take 1 1/2 tablets and then change weekly dose to take 1 tablet daily except take 1 1/2 tablets on Mondays and Thursdays. Recheck in 3 weeks.  Contacted pt by phone and advised of dosing and recheck date. Pt reports she may be out of town in 3 weeks but will call if th at occurs. Pt verbalized understanding.

## 2023-11-20 NOTE — Progress Notes (Signed)
Patient Care Team: Panosh, Neta Mends, MD as PCP - General (Internal Medicine) Jens Som Madolyn Frieze, MD as PCP - Cardiology (Cardiology) Jens Som Madolyn Frieze, MD (Cardiology) Jene Every, MD (Orthopedic Surgery) Pershing Proud, RN as Oncology Nurse Navigator Donnelly Angelica, RN as Oncology Nurse Navigator Serena Croissant, MD as Consulting Physician (Hematology and Oncology) [facility]  DIAGNOSIS:  Encounter Diagnosis  Name Primary?   Malignant neoplasm of overlapping sites of left breast in female, estrogen receptor negative (HCC) Yes    SUMMARY OF ONCOLOGIC HISTORY: Oncology History  Malignant neoplasm of overlapping sites of left breast in female, estrogen receptor negative (HCC)  08/01/2019 Initial Diagnosis   Patient palpated left breast lump and skin thickening. Mammogram showed a 3.6cm mass at 3:30 position, a 0.9cm mass at 3:00 position, a 0.7cm mass at 2:00 position, a 0.5cm mass at 2:00 position, a 0.3cm mass at 1:00 position, and a 0.4cm mass at 1:00 position, with 4 abnormal left axillary lymph nodes. Biopsy showed IDC, grade 3, HER-2 + (3+), ER/PR -, Ki67 70%, in the breast and lymph nodes.    08/06/2019 Cancer Staging   Staging form: Breast, AJCC 8th Edition - Clinical: Stage IIIB (cT4, cN1, cM0, G3, ER-, PR-, HER2+) - Signed by Serena Croissant, MD on 08/06/2019   08/19/2019 - 01/02/2020 Chemotherapy   The patient had dexamethasone (DECADRON) 4 MG tablet, 4 mg (100 % of original dose 4 mg), Oral, 2 times daily, 1 of 1 cycle, Start date: 08/06/2019, End date: 01/19/2020 Dose modification: 4 mg (original dose 4 mg, Cycle 0) palonosetron (ALOXI) injection 0.25 mg, 0.25 mg, Intravenous,  Once, 6 of 6 cycles Administration: 0.25 mg (08/19/2019), 0.25 mg (09/08/2019), 0.25 mg (12/12/2019), 0.25 mg (01/02/2020), 0.25 mg (10/31/2019), 0.25 mg (11/22/2019) pegfilgrastim-cbqv (UDENYCA) injection 6 mg, 6 mg, Subcutaneous, Once, 2 of 2 cycles Administration: 6 mg (08/21/2019), 6 mg  (09/10/2019) CARBOplatin (PARAPLATIN) 480 mg in sodium chloride 0.9 % 250 mL chemo infusion, 480 mg (110.9 % of original dose 429.6 mg), Intravenous,  Once, 2 of 2 cycles Dose modification:   (original dose 429.6 mg, Cycle 1) Administration: 480 mg (08/19/2019), 340 mg (09/08/2019) DOCEtaxel (TAXOTERE) 160 mg in sodium chloride 0.9 % 250 mL chemo infusion, 75 mg/m2 = 160 mg, Intravenous,  Once, 6 of 6 cycles Dose modification: 60 mg/m2 (original dose 75 mg/m2, Cycle 2, Reason: Dose not tolerated), 50 mg/m2 (original dose 75 mg/m2, Cycle 5, Reason: Dose not tolerated) Administration: 160 mg (08/19/2019), 130 mg (09/08/2019), 110 mg (12/12/2019), 110 mg (01/02/2020), 110 mg (10/31/2019), 110 mg (11/22/2019) pertuzumab (PERJETA) 420 mg in sodium chloride 0.9 % 250 mL chemo infusion, 420 mg (100 % of original dose 420 mg), Intravenous, Once, 2 of 2 cycles Dose modification: 420 mg (original dose 420 mg, Cycle 1, Reason: Provider Judgment) Administration: 420 mg (08/19/2019), 420 mg (09/08/2019) fosaprepitant (EMEND) 150 mg, dexamethasone (DECADRON) 12 mg in sodium chloride 0.9 % 145 mL IVPB, , Intravenous,  Once, 3 of 3 cycles Administration:  (08/19/2019),  (09/08/2019),  (10/31/2019) trastuzumab-anns (KANJINTI) 750 mg in sodium chloride 0.9 % 250 mL chemo infusion, 756 mg (100 % of original dose 8 mg/kg), Intravenous,  Once, 6 of 6 cycles Dose modification: 8 mg/kg (original dose 8 mg/kg, Cycle 1, Reason: Other (see comments), Comment: change to approved brand), 6 mg/kg (original dose 6 mg/kg, Cycle 2, Reason: Other (see comments), Comment: brand change per insurance) Administration: 750 mg (08/19/2019), 567 mg (09/08/2019), 567 mg (10/31/2019), 567 mg (11/22/2019), 567 mg (12/12/2019),  504 mg (01/02/2020)  for chemotherapy treatment.    09/21/2019 - 10/03/2019 Hospital Admission   Intractable nausea and vomiting   01/23/2020 - 08/19/2020 Chemotherapy   Patient is on Treatment Plan : BREAST Trastuzumab q21d      03/09/2020 Surgery   Left mastectomy and left axillary lymph node dissection ([facility]): no residual carcinoma, 2 negative intramammary lymph nodes, and 19 negative left axillary lymph nodes.    05/27/2020 - 07/12/2020 Radiation Therapy   Adjuvant left chest wall radiation     CHIEF COMPLIANT: Surveillance of breast cancer  HISTORY OF PRESENT ILLNESS:  History of Present Illness   [name] Foster is a 73 year old female with breast cancer who presents for follow-up care.  She is in her fourth year post-diagnosis of breast cancer, having undergone chemotherapy, surgery, and radiation therapy. She is not currently on any medication such as Anastrozole. Recent mammogram results show very low breast density.  She experiences discomfort under the arm, described as feeling like a board, a common post-treatment symptom. No pain or discomfort in the breast is reported.  She continues to experience numbness in her toes, a residual effect from chemotherapy. No memory loss or additional neuropathy in fingers is reported.         ALLERGIES:  is allergic to penicillins, tetanus toxoid, and aspirin.  MEDICATIONS:  Current Outpatient Medications  Medication Sig Dispense Refill   Albuterol Sulfate (PROAIR RESPICLICK) 108 (90 Base) MCG/ACT AEPB Inhale 2 puffs into the lungs every 6 (six) hours as needed. 2 each 1   amLODipine (NORVASC) 10 MG tablet TAKE 1 TABLET BY MOUTH DAILY 90 tablet 2   atorvastatin (LIPITOR) 20 MG tablet TAKE 1 TABLET BY MOUTH ONCE  DAILY 90 tablet 2   Carboxymethylcellul-Glycerin (CLEAR EYES FOR DRY EYES) 1-0.25 % SOLN Place 1 drop into both eyes 3 (three) times daily as needed (dry eyes).      Cholecalciferol (VITAMIN D) 50 MCG (2000 UT) tablet Take 2,000 Units by mouth daily.     fexofenadine (ALLEGRA) 180 MG tablet Take 180 mg by mouth daily as needed for allergies.      fluticasone (FLONASE) 50 MCG/ACT nasal spray USE 2 SPRAYS IN EACH NOSTRIL DAILY. PT NEEDS TO SCHEDULE  A FOLLOW UP APPT BEFORE NEXT REFILL. (Patient taking differently: Place 2 sprays into both nostrils daily.) 16 g 0   furosemide (LASIX) 40 MG tablet Take 0.5 tablets (20 mg total) by mouth daily. 90 tablet 3   hydrALAZINE (APRESOLINE) 50 MG tablet TAKE 1 TABLET BY MOUTH 3 TIMES  DAILY PLEASE KEEP SCHEDULED  APPOINTMENT FOR FUTURE REFILLS.  THANK YOU 270 tablet 2   metoprolol tartrate (LOPRESSOR) 50 MG tablet TAKE 1 TABLET BY MOUTH TWICE  DAILY 180 tablet 2   sodium chloride (OCEAN) 0.65 % nasal spray Place into the nose as needed.     warfarin (COUMADIN) 5 MG tablet TAKE 1 TABLET BY MOUTH DAILY  EXCEPT TAKE 1 AND 1/2 TABLETS BY MOUTH ON MONDAYS OR AS DIRECTED  BY ANTICOAGULATION CLINIC 122 tablet 2   No current facility-administered medications for this visit.    PHYSICAL EXAMINATION: ECOG PERFORMANCE STATUS: 1 - Symptomatic but completely ambulatory  Vitals:   11/20/23 1045 11/20/23 1051  BP: (!) 157/64 137/67  Pulse: (!) 58   Resp: 20   Temp: 97.7 F (36.5 C)   SpO2: 100%    Filed Weights   11/20/23 1045  Weight: 225 lb 12.8 oz (102.4 kg)    Physical  Exam no palpable lumps or nodules in the right breast.  No lumps or nodules in the left chest wall or axilla        (exam performed in the presence of a chaperone)  LABORATORY DATA:  I have reviewed the data as listed    Latest Ref Rng & Units 05/10/2023   11:30 AM 07/24/2022   10:18 AM 05/02/2022   11:02 AM  CMP  Glucose 70 - 99 mg/dL 89  95  96   BUN 6 - 23 mg/dL 19  21  14    Creatinine 0.40 - 1.20 mg/dL 4.69  6.29  5.28   Sodium 135 - 145 mEq/L 143  142  140   Potassium 3.5 - 5.1 mEq/L 3.6  3.2  3.5   Chloride 96 - 112 mEq/L 106  103  101   CO2 19 - 32 mEq/L 26  24  30    Calcium 8.4 - 10.5 mg/dL 9.8  9.3  9.3   Total Protein 6.0 - 8.3 g/dL 8.4  7.8  7.3   Total Bilirubin 0.2 - 1.2 mg/dL 0.5  0.4  0.4   Alkaline Phos 39 - 117 U/L 127  141  124   AST 0 - 37 U/L 23  23  25    ALT 0 - 35 U/L 15  17  20      Lab Results   Component Value Date   WBC 7.8 05/10/2023   HGB 15.0 05/10/2023   HCT 47.6 (H) 05/10/2023   MCV 89.8 05/10/2023   PLT 146.0 (L) 05/10/2023   NEUTROABS 4.4 05/10/2023    ASSESSMENT & PLAN:  Malignant neoplasm of overlapping sites of left breast in female, estrogen receptor negative (HCC) 07/31/2020:Patient palpated left breast lump and skin thickening. Mammogram showed a 3.6cm mass at 3:30 position, a 0.9cm mass at 3:00 position, a 0.7cm mass at 2:00 position, a 0.5cm mass at 2:00 position, a 0.3cm mass at 1:00 position, and a 0.4cm mass at 1:00 position, with 4 abnormal left axillary lymph nodes. Biopsy showed IDC, grade 3, HER-2 + (3+), ER/PR -, Ki67 70%, in the breast and lymph nodes.    Treatment plan  1. Neoadjuvant chemotherapy with TCH Perjeta 6 cycles completed 01/02/2020 followed by Herceptin maintenance completed 08/19/20  2. 03/09/2020: Left mastectomy and left axillary lymph node dissection ([facility]): no residual carcinoma, 2 negative intramammary lymph nodes, and 19 negative left axillary lymph nodes. 3. Followed by adjuvant radiation therapy completed 07/12/2020  --------------------------------------------------------------------------------------------------------------------------------------------- Left arm lymphedema: She is waiting for a lymphedema pump to arrive.  She is still working with physical therapy.    Breast Cancer Surveillance: 10/12/2023: right breast mammogram: Benign, density A 11/20/2023: Breast exam: Benign 10/06/2022: Bone density: T-score -1.4: Osteopenia: Calcium vitamin D and weightbearing exercises    Return to clinic in 1 year for surveillance check and follow-up. ------------------------------------- Assessment and Plan    Breast Cancer (Post-Treatment) [name] Foster is in her fourth year post-diagnosis of breast cancer, having completed chemotherapy, surgery, and radiation. She is not currently on Anastrozole. She reports no breast pain but experiences  discomfort under her arm, described as feeling like a board, a common post-treatment symptom. Her recent mammogram showed very low breast density (A), which is favorable for detecting abnormalities. - Continue regular follow-up mammograms - Monitor for any new symptoms or changes in existing symptoms  Chemotherapy-Induced Peripheral Neuropathy Masyn reports persistent numbness in her toes, a residual effect from chemotherapy, with no worsening or new areas of numbness. - Monitor  symptoms of peripheral neuropathy - Consider referral to neurology if symptoms worsen or new symptoms develop  General Health Maintenance Hadleigh's overall health is good. - Continue routine health maintenance and screenings.          No orders of the defined types were placed in this encounter.  The patient has a good understanding of the overall plan. she agrees with it. she will call with any problems that may develop before the next visit here. Total time spent: 30 mins including face to face time and time spent for planning, charting and co-ordination of care   Tamsen Meek, MD 11/20/23

## 2023-11-20 NOTE — Patient Instructions (Addendum)
Pre visit review using our clinic review tool, if applicable. No additional management support is needed unless otherwise documented below in the visit note.  Increase dose today to take 1 1/2 tablets and then change weekly dose to take 1 tablet daily except take 1 1/2 tablets on Mondays and Thursdays. Recheck in 3 weeks.

## 2023-11-27 ENCOUNTER — Ambulatory Visit: Payer: Medicare Other

## 2023-12-11 ENCOUNTER — Ambulatory Visit (INDEPENDENT_AMBULATORY_CARE_PROVIDER_SITE_OTHER): Payer: Medicare Other

## 2023-12-11 DIAGNOSIS — C642 Malignant neoplasm of left kidney, except renal pelvis: Secondary | ICD-10-CM | POA: Diagnosis not present

## 2023-12-11 DIAGNOSIS — Z7901 Long term (current) use of anticoagulants: Secondary | ICD-10-CM

## 2023-12-11 LAB — POCT INR: INR: 3.4 — AB (ref 2.0–3.0)

## 2023-12-11 NOTE — Progress Notes (Signed)
 Pt reduced amount of vitamin K containing foods in diet. Pt reports she will increase those and be more consistent with these foods. So, no change in weekly dose will be made. Advised if any s/s of bleeding or abnormal bruising to go to ER. Pt verbalized understanding.  Hold dose and then continue 1 tablet daily except take 1 1/2 tablets on Mondays and Thursdays. Recheck in 2 weeks.

## 2023-12-11 NOTE — Patient Instructions (Addendum)
 Pre visit review using our clinic review tool, if applicable. No additional management support is needed unless otherwise documented below in the visit note.  Hold dose and then continue 1 tablet daily except take 1 1/2 tablets on Mondays and Thursdays. Recheck in 2 weeks.

## 2023-12-12 DIAGNOSIS — R599 Enlarged lymph nodes, unspecified: Secondary | ICD-10-CM | POA: Diagnosis not present

## 2023-12-12 DIAGNOSIS — C642 Malignant neoplasm of left kidney, except renal pelvis: Secondary | ICD-10-CM | POA: Diagnosis not present

## 2023-12-12 DIAGNOSIS — C50912 Malignant neoplasm of unspecified site of left female breast: Secondary | ICD-10-CM | POA: Diagnosis not present

## 2023-12-13 ENCOUNTER — Ambulatory Visit: Payer: Medicare Other | Admitting: Podiatry

## 2023-12-27 ENCOUNTER — Ambulatory Visit

## 2023-12-28 ENCOUNTER — Telehealth: Payer: Self-pay

## 2023-12-28 DIAGNOSIS — Z7901 Long term (current) use of anticoagulants: Secondary | ICD-10-CM

## 2023-12-28 NOTE — Telephone Encounter (Signed)
Pt NS coumadin clinic apt yesterday. LVM for pt to return call to RS apt.

## 2023-12-28 NOTE — Telephone Encounter (Signed)
 Pt returned call. Reports she is out of town and forgot about apt yesterday for INR check. She reports she could go to a [facility] today or tomorrow. Advised labs done today, Friday, or tomorrow, Saturday, would not be received until Monday and would not be good for dosing. She reports she is headed to [place] next week and is not sure if she can find a [facility] there. She is going to f/u with the coumadin clinic when she finds one and request an order. Pt denies any changes. Advised if any s/s of abnormal bruising or bleeding or any s/s of a clot to go to ER or call 911. Pt verbalized understanding.

## 2024-01-09 NOTE — Telephone Encounter (Signed)
 LVM for pt to return call concerning have INR check while out of town.

## 2024-01-15 NOTE — Telephone Encounter (Signed)
 LVM

## 2024-01-24 ENCOUNTER — Other Ambulatory Visit: Payer: Self-pay | Admitting: Internal Medicine

## 2024-01-24 DIAGNOSIS — Z7901 Long term (current) use of anticoagulants: Secondary | ICD-10-CM

## 2024-01-24 NOTE — Telephone Encounter (Signed)
 Pt is compliant with warfarin management and PCP apts.  Sent in refill of warfarin to requested pharmacy.    Pt has not been completely compliant with warfarin management in the last several months. Pt reported she has been travelling out of town a lot helping ailing family members. Tried to get pt to go to lab out of town but that has not been successful.   Will send in 90 day supply of warfarin with no refills.

## 2024-01-24 NOTE — Telephone Encounter (Signed)
 Contacted pt and she reports she is in [place]  and can go to a [facility] there on Monday, 4/28. Advised an order will be in her chart and this nurse will f/u with dosing instructions the day after the lab is drawn. Pt verbalized understanding.   Placed order for lab INR. Made note to check result on 4/28 and 4/29.

## 2024-01-24 NOTE — Addendum Note (Signed)
 Addended by: Ian Maine A on: 01/24/2024 03:15 PM   Modules accepted: Orders

## 2024-01-28 DIAGNOSIS — Z7901 Long term (current) use of anticoagulants: Secondary | ICD-10-CM | POA: Diagnosis not present

## 2024-01-28 NOTE — Telephone Encounter (Signed)
 Pt reports she went to [facility] today for INR check.

## 2024-01-29 ENCOUNTER — Ambulatory Visit (INDEPENDENT_AMBULATORY_CARE_PROVIDER_SITE_OTHER)

## 2024-01-29 DIAGNOSIS — Z7901 Long term (current) use of anticoagulants: Secondary | ICD-10-CM | POA: Diagnosis not present

## 2024-01-29 LAB — POCT INR: INR: 2.8 (ref 2.0–3.0)

## 2024-01-29 LAB — PROTIME-INR
INR: 2.8 — ABNORMAL HIGH (ref 0.9–1.2)
Prothrombin Time: 28.8 s — ABNORMAL HIGH (ref 9.1–12.0)

## 2024-01-29 NOTE — Progress Notes (Signed)
 Pt is out of town and went to a local [facility] for INR lab draw yesterday. No POCT INR was performed. Result back this morning of 2.8 Continue 1 tablet daily except take 1 1/2 tablets on Mondays and Thursdays. Recheck in 6 weeks at [place] location. Contacted pt by phone and advised of dosing and recheck date per pt request on Wednesday because that is when she is back in town for a graduation.

## 2024-01-29 NOTE — Patient Instructions (Addendum)
 Pre visit review using our clinic review tool, if applicable. No additional management support is needed unless otherwise documented below in the visit note.  Continue 1 tablet daily except take 1 1/2 tablets on Mondays and Thursdays. Recheck in 6 weeks at [place] location.

## 2024-03-04 DIAGNOSIS — N184 Chronic kidney disease, stage 4 (severe): Secondary | ICD-10-CM | POA: Diagnosis not present

## 2024-03-04 DIAGNOSIS — R590 Localized enlarged lymph nodes: Secondary | ICD-10-CM | POA: Diagnosis not present

## 2024-03-04 DIAGNOSIS — C642 Malignant neoplasm of left kidney, except renal pelvis: Secondary | ICD-10-CM | POA: Diagnosis not present

## 2024-03-12 ENCOUNTER — Ambulatory Visit

## 2024-03-12 ENCOUNTER — Other Ambulatory Visit: Payer: Self-pay

## 2024-03-12 ENCOUNTER — Telehealth: Payer: Self-pay

## 2024-03-12 DIAGNOSIS — Z7901 Long term (current) use of anticoagulants: Secondary | ICD-10-CM

## 2024-03-12 NOTE — Telephone Encounter (Signed)
 Pt LVM she got caught out of town and will not make it in for her coumadin  clinic apt today. Pt requests a lab order be placed so she can go to [facility] tomorrow.   Advised pt an order will be placed. Advised to contact coumadin  clinic when she has had the lab draw so the result can be obtained. Advised this nurse will f/u with dosing instructions and f/u apt when results are received.   Placed order for lab INR

## 2024-03-12 NOTE — Progress Notes (Signed)
 Adding lab INR order for pt to have drawn at [facility] out of town.

## 2024-03-13 DIAGNOSIS — Z7901 Long term (current) use of anticoagulants: Secondary | ICD-10-CM | POA: Diagnosis not present

## 2024-03-14 ENCOUNTER — Other Ambulatory Visit: Payer: Self-pay

## 2024-03-14 ENCOUNTER — Ambulatory Visit (INDEPENDENT_AMBULATORY_CARE_PROVIDER_SITE_OTHER)

## 2024-03-14 DIAGNOSIS — Z7901 Long term (current) use of anticoagulants: Secondary | ICD-10-CM

## 2024-03-14 NOTE — Progress Notes (Signed)
 Adding lab INR order

## 2024-03-14 NOTE — Patient Instructions (Addendum)
 Pre visit review using our clinic review tool, if applicable. No additional management support is needed unless otherwise documented below in the visit note.  Continue 1 tablet daily except take 1 1/2 tablets on Mondays and Thursdays. Recheck in 6 weeks.

## 2024-03-14 NOTE — Progress Notes (Signed)
 Pt is out of town and went to a local [facility] for INR lab draw yesterday. No POCT INR was performed. Result back this morning of 2.2 Continue 1 tablet daily except take 1 1/2 tablets on Mondays and Thursdays. Recheck in 6 weeks. Contacted pt by phone and advised of dosing and recheck date. Advised if any changes to contact coumadin  clinic. Pt verbalized understanding. Pt will still be out of town and will retest on 7/21 or 7/22 at a [facility].  Entered another lab INR order.

## 2024-03-24 ENCOUNTER — Ambulatory Visit: Payer: Self-pay

## 2024-04-21 ENCOUNTER — Other Ambulatory Visit: Payer: Self-pay

## 2024-04-21 DIAGNOSIS — Z7901 Long term (current) use of anticoagulants: Secondary | ICD-10-CM | POA: Diagnosis not present

## 2024-04-22 ENCOUNTER — Ambulatory Visit (INDEPENDENT_AMBULATORY_CARE_PROVIDER_SITE_OTHER)

## 2024-04-22 DIAGNOSIS — Z7901 Long term (current) use of anticoagulants: Secondary | ICD-10-CM | POA: Diagnosis not present

## 2024-04-22 LAB — PROTIME-INR
INR: 2.5 — ABNORMAL HIGH (ref 0.9–1.2)
Prothrombin Time: 25.4 s — ABNORMAL HIGH (ref 9.1–12.0)

## 2024-04-22 MED ORDER — WARFARIN SODIUM 5 MG PO TABS: ORAL_TABLET | ORAL | 1 refills | Status: DC

## 2024-04-22 NOTE — Progress Notes (Signed)
 Pt is out of town and went to a local [facility] for INR lab draw yesterday. No POCT INR was performed. Result back this morning of 2.5 Continue 1 tablet daily except take 1 1/2 tablets on Mondays and Thursdays. Recheck in 5 weeks. Contacted pt by phone and advised of dosing and recheck date. Advised if any changes to contact coumadin  clinic. Pt verbalized understanding. Pt will still be out of town and will retest on 8/25 or 8/26 at a [facility].  Entered another lab INR order.   Pt is compliant with warfarin management and PCP apts.  Sent in refill of warfarin to requested pharmacy.

## 2024-04-22 NOTE — Patient Instructions (Signed)
Pre visit review using our clinic review tool, if applicable. No additional management support is needed unless otherwise documented below in the visit note. 

## 2024-04-26 ENCOUNTER — Other Ambulatory Visit: Payer: Self-pay | Admitting: Cardiology

## 2024-05-28 ENCOUNTER — Telehealth: Payer: Self-pay

## 2024-05-28 LAB — POCT INR: INR: 4.3 — AB (ref 2.0–3.0)

## 2024-05-28 NOTE — Telephone Encounter (Signed)
 Pt is out of town and goes to local [facility] and was due for INR check on 8/25. No result received.  Contacted pt who reports she went to the lab on Monday but they were closed for lunch and she could not wait. She is going to try to go today. Advised this nurse will be out of the office next week so if she cannot make it to the lab today to wait to go to the lab until the week after next. Pt verbalized understanding.

## 2024-05-29 ENCOUNTER — Ambulatory Visit (INDEPENDENT_AMBULATORY_CARE_PROVIDER_SITE_OTHER)

## 2024-05-29 DIAGNOSIS — Z7901 Long term (current) use of anticoagulants: Secondary | ICD-10-CM

## 2024-05-29 LAB — PROTIME-INR
INR: 4.3 — ABNORMAL HIGH (ref 0.9–1.2)
Prothrombin Time: 42.9 s — ABNORMAL HIGH (ref 9.1–12.0)

## 2024-05-29 NOTE — Patient Instructions (Addendum)
 Pre visit review using our clinic review tool, if applicable. No additional management support is needed unless otherwise documented below in the visit note.  Hold dose today and reduce dose tomorrow to take 1/2 tablet and then change weekly dose to take 1 tablet daily except take 1 1/2 tablets on  Thursdays. Recheck in 2 weeks.

## 2024-05-29 NOTE — Progress Notes (Signed)
 Pt is out of town and went to a local [facility] for INR lab draw yesterday. No POCT INR was performed. Result back this morning of 4.3 Hold dose today and reduce dose tomorrow to take 1/2 tablet and then change weekly dose to take 1 tablet daily except take 1 1/2 tablets on  Thursdays. Recheck in 3 weeks, on 9/15.  Contacted pt by phone and advised of dosing and recheck date. Advised if any changes to contact coumadin  clinic. Pt verbalized understanding. Pt will still be out of town and will retest at a [facility].  Entered another lab INR order.

## 2024-06-16 ENCOUNTER — Ambulatory Visit (INDEPENDENT_AMBULATORY_CARE_PROVIDER_SITE_OTHER)

## 2024-06-16 ENCOUNTER — Telehealth: Payer: Self-pay

## 2024-06-16 ENCOUNTER — Other Ambulatory Visit (HOSPITAL_BASED_OUTPATIENT_CLINIC_OR_DEPARTMENT_OTHER): Payer: Self-pay

## 2024-06-16 DIAGNOSIS — C642 Malignant neoplasm of left kidney, except renal pelvis: Secondary | ICD-10-CM | POA: Diagnosis not present

## 2024-06-16 DIAGNOSIS — Z171 Estrogen receptor negative status [ER-]: Secondary | ICD-10-CM | POA: Diagnosis not present

## 2024-06-16 DIAGNOSIS — I890 Lymphedema, not elsewhere classified: Secondary | ICD-10-CM | POA: Diagnosis not present

## 2024-06-16 DIAGNOSIS — Z7901 Long term (current) use of anticoagulants: Secondary | ICD-10-CM

## 2024-06-16 LAB — POCT INR: INR: 2.8 (ref 2.0–3.0)

## 2024-06-16 MED ORDER — FLUZONE HIGH-DOSE 0.5 ML IM SUSY
0.5000 mL | PREFILLED_SYRINGE | Freq: Once | INTRAMUSCULAR | 0 refills | Status: AC
  Filled 2024-06-16: qty 0.5, 1d supply, fill #0

## 2024-06-16 MED ORDER — COMIRNATY 30 MCG/0.3ML IM SUSY
0.3000 mL | PREFILLED_SYRINGE | Freq: Once | INTRAMUSCULAR | 0 refills | Status: AC
Start: 2024-06-16 — End: 2024-06-17
  Filled 2024-06-16: qty 0.3, 1d supply, fill #0

## 2024-06-16 NOTE — Telephone Encounter (Signed)
 Pt had a coumadin  clinic apt today at [facility] at 2:30. She reports she is running late today and also has an apt at with [facility] for a one year f/u after breast surgery.  She requested to come as late as possible today. Advised latest apt is 4:30. She said if she cannot make it by 4:30 she will go to [facility] for a lab INR draw as she has been doing when she was out of town. Advised to let coumadin  clinic know when she will have the lab so results can be obtained. Pt verbalized understanding.

## 2024-06-16 NOTE — Patient Instructions (Addendum)
 Pre visit review using our clinic review tool, if applicable. No additional management support is needed unless otherwise documented below in the visit note.  Continue 1 tablet daily except take 1 1/2 tablets on  Thursdays. Recheck in 6 weeks.

## 2024-06-16 NOTE — Progress Notes (Signed)
 Continue 1 tablet daily except take 1 1/2 tablets on  Thursdays. Recheck in 6 weeks.  Pt will be out of town for next testing date. She will go [facility] a local [facility] for lab draw. Will f/u with pt with dosing instructions when result is received. Pt is aware of requested test date.  Placed order for lab INR

## 2024-06-30 ENCOUNTER — Telehealth: Payer: Self-pay | Admitting: Cardiology

## 2024-06-30 DIAGNOSIS — I428 Other cardiomyopathies: Secondary | ICD-10-CM

## 2024-06-30 DIAGNOSIS — I5032 Chronic diastolic (congestive) heart failure: Secondary | ICD-10-CM

## 2024-06-30 MED ORDER — FUROSEMIDE 40 MG PO TABS: 20.0000 mg | ORAL_TABLET | Freq: Every day | ORAL | 0 refills | Status: DC

## 2024-06-30 NOTE — Telephone Encounter (Signed)
*  STAT* If patient is at the pharmacy, call can be transferred to refill team.   1. Which medications need to be refilled? (please list name of each medication and dose if known) furosemide  (LASIX ) 40 MG tablet    2. Would you like to learn more about the convenience, safety, & potential cost savings by using the [facility]?      3. Are you open to using the [facility] (Type [facility].  ).   4. Which pharmacy/location (including street and city if local pharmacy) is medication to be sent to? [facility] - [place], [place] - [address] [place]   5. Do they need a 30 day or 90 day supply? 90 day  Patient is out of medication

## 2024-07-02 ENCOUNTER — Other Ambulatory Visit: Payer: Self-pay | Admitting: Cardiology

## 2024-07-02 DIAGNOSIS — I428 Other cardiomyopathies: Secondary | ICD-10-CM

## 2024-07-02 DIAGNOSIS — I5032 Chronic diastolic (congestive) heart failure: Secondary | ICD-10-CM

## 2024-07-03 MED ORDER — FUROSEMIDE 40 MG PO TABS: 20.0000 mg | ORAL_TABLET | Freq: Every day | ORAL | 0 refills | Status: DC

## 2024-07-28 ENCOUNTER — Telehealth: Payer: Self-pay

## 2024-07-28 NOTE — Telephone Encounter (Signed)
 Pt has been going to [facility] lab out of town due to a lot of traveling in the last several months. Pt was to test this week. Pt reports she went to the lab this morning and they unsuccessfully tried to obtain a specimen 6 times.  She reports she will hydrate and try again on 10/29. She requested an apt for 11/7 for coumadin  clinic when she has a cardiology apt that morning. She will cancel if they are successful getting a sample on 10/29.  Placed pt on coumadin  clinic schedule.

## 2024-07-29 NOTE — Progress Notes (Signed)
 HPI: FU nonischemic cardiomyopathy and aortic insufficiency. Previous catheterization in 2004 showed no obstructive coronary disease. CT of her chest in Nov 2008 showed no thoracic aneurysm. Previous Holter monitor secondary to dizzy spells showed PACs and PVCs. Nuclear study April 2014 showed an ejection fraction of 62% and normal perfusion. Patient underwent transesophageal echocardiogram in May of 2014. Her ejection fraction was 50-55%. There was moderate aortic insufficiency and mild mitral regurgitation. There was a linear density associated with the atrial septum of uncertain etiology. Patient has had occasional noncompliance in the past with medications. If blood pressure not controlled LV function noted to be reduced with worsening aortic insufficiency. Her ACE inhibitor was discontinued previously because of dehydration/renal insufficiency.  Echocardiogram August 2024 showed normal LV function, grade 1 diastolic dysfunction, moderate to severe aortic insufficiency.   Since I last saw her, patient notes increased dyspnea on exertion and mild increased pedal edema.  She denies orthopnea, chest pain or syncope.   Current Outpatient Medications  Medication Sig Dispense Refill   Albuterol  Sulfate (PROAIR  RESPICLICK) 108 (90 Base) MCG/ACT AEPB Inhale 2 puffs into the lungs every 6 (six) hours as needed. 2 each 1   amLODipine  (NORVASC ) 10 MG tablet TAKE 1 TABLET BY MOUTH DAILY 100 tablet 2   atorvastatin  (LIPITOR) 20 MG tablet TAKE 1 TABLET BY MOUTH ONCE  DAILY 100 tablet 2   Carboxymethylcellul-Glycerin  (CLEAR EYES FOR DRY EYES) 1-0.25 % SOLN Place 1 drop into both eyes 3 (three) times daily as needed (dry eyes).      Cholecalciferol  (VITAMIN D ) 50 MCG (2000 UT) tablet Take 2,000 Units by mouth daily.     fexofenadine  (ALLEGRA ) 180 MG tablet Take 180 mg by mouth daily as needed for allergies.      fluticasone  (FLONASE ) 50 MCG/ACT nasal spray USE 2 SPRAYS IN EACH NOSTRIL DAILY. PT NEEDS TO  SCHEDULE A FOLLOW UP APPT BEFORE NEXT REFILL. (Patient taking differently: Place 2 sprays into both nostrils daily.) 16 g 0   furosemide  (LASIX ) 40 MG tablet Take 0.5 tablets (20 mg total) by mouth daily. 45 tablet 0   hydrALAZINE  (APRESOLINE ) 50 MG tablet Take 1 tablet (50 mg total) by mouth 3 (three) times daily. 300 tablet 2   metoprolol  tartrate (LOPRESSOR ) 50 MG tablet TAKE 1 TABLET BY MOUTH TWICE  DAILY 200 tablet 2   sodium chloride  (OCEAN) 0.65 % nasal spray Place into the nose as needed.     warfarin (COUMADIN ) 5 MG tablet TAKE 1 TABLET BY MOUTH DAILY EXCEPT TAKE 1 AND 1/2 TABLETS BY MOUTH ON MONDAYS AND THURSDAYS OR AS  DIRECTED BY ANTICOAGULATION  CLINIC 110 tablet 1   No current facility-administered medications for this visit.     Past Medical History:  Diagnosis Date   Allergic rhinitis    Anemia    Aortic valve disorders    Arthritis    Asthma    Cancer (HCC)    Breast Cancer   CHF (congestive heart failure) (HCC)    Coronary artery disease    Family history of adverse reaction to anesthesia    difficulty waking mother up after surgery   Family history of breast cancer    Family history of cervical cancer    Family history of colon cancer    Family history of throat cancer    Heart murmur    Hyperlipidemia    Hypertension    Intractable nausea and vomiting 09/21/2019   Long term (current) use of anticoagulants  Other primary cardiomyopathies    Peripheral vascular disease    right leg   Personal history of chemotherapy    Personal history of colonic polyps    Personal history of radiation therapy    Personal history of venous thrombosis and embolism     Past Surgical History:  Procedure Laterality Date   ABDOMINAL HYSTERECTOMY     BREAST BIOPSY Left 07/2019   CARDIAC CATHETERIZATION  2004   DILATION AND CURETTAGE OF UTERUS     several   MASTECTOMY MODIFIED RADICAL Left 03/09/2020   Left Modified Radical Mastectomy (left mastectomy with axillary lymph  node dissection)    MASTECTOMY MODIFIED RADICAL Left 03/09/2020   Procedure: LEFT MODIFIED RADICAL MASTECTOMY;  Surgeon: Aron Shoulders, MD;  Location: [facility] OR;  Service: General;  Laterality: Left;  GEN AND PECTORAL BLOCK   PORT-A-CATH REMOVAL N/A 08/31/2020   Procedure: REMOVAL PORT-A-CATH;  Surgeon: Aron Shoulders, MD;  Location: [facility] OR;  Service: General;  Laterality: N/A;   PORTACATH PLACEMENT Left 08/18/2019   Procedure: INSERTION PORT-A-CATH;  Surgeon: Aron Shoulders, MD;  Location: [facility] OR;  Service: General;  Laterality: Left;   Rt knee arthoscopic     SHOULDER ARTHROSCOPY W/ ROTATOR CUFF REPAIR Right 07/2016   TEE WITHOUT CARDIOVERSION N/A 02/21/2013   Procedure: TRANSESOPHAGEAL ECHOCARDIOGRAM (TEE);  Surgeon: Redell GORMAN Shallow, MD;  Location: [facility] ENDOSCOPY;  Service: Cardiovascular;  Laterality: N/A;    Social History   Socioeconomic History   Marital status: Legally Separated    Spouse name: Not on file   Number of children: Not on file   Years of education: Not on file   Highest education level: Not on file  Occupational History    Comment: retired  Tobacco Use   Smoking status: Former    Current packs/day: 0.00    Types: Cigarettes    Quit date: 07/02/2019    Years since quitting: 5.1   Smokeless tobacco: Never  Vaping Use   Vaping status: Never Used  Substance and Sexual Activity   Alcohol  use: Yes    Alcohol /week: 1.0 standard drink of alcohol     Types: 1 Glasses of wine per week    Comment: rare occasion   Drug use: No   Sexual activity: Not on file    Comment: Hysterectomy  Other Topics Concern   Not on file  Social History Narrative   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    Divorced   Regular exercise- no   5 hours sleep    Lives with a son age 59    No pets         Social Drivers of Corporate Investment Banker Strain: Medium Risk (03/08/2023)   Overall Financial Resource Strain (CARDIA)    Difficulty of Paying Living Expenses: Somewhat hard   Food Insecurity: Food Insecurity Present (03/08/2023)   Hunger Vital Sign    Worried About Running Out of Food in the Last Year: Sometimes true    Ran Out of Food in the Last Year: Sometimes true  Transportation Needs: Unmet Transportation Needs (11/20/2023)   PRAPARE - Administrator, Civil Service (Medical): Yes    Lack of Transportation (Non-Medical): Yes  Physical Activity: Insufficiently Active (03/08/2023)   Exercise Vital Sign    Days of Exercise per Week: 3 days    Minutes of Exercise per Session: 30 min  Stress: No Stress Concern Present (03/08/2023)   [employer] of Occupational Health - Occupational Stress Questionnaire  Feeling of Stress : Not at all  Social Connections: Unknown (03/08/2023)   Social Connection and Isolation Panel    Frequency of Communication with Friends and Family: Once a week    Frequency of Social Gatherings with Friends and Family: Never    Attends Religious Services: Patient unable to answer    Active Member of Clubs or Organizations: No    Attends Banker Meetings: Never    Marital Status: Separated  Intimate Partner Violence: Not At Risk (03/01/2021)   Humiliation, Afraid, Rape, and Kick questionnaire    Fear of Current or Ex-Partner: No    Emotionally Abused: No    Physically Abused: No    Sexually Abused: No    Family History  Problem Relation Age of Onset   Alcohol  abuse Other    Depression Other    Hyperlipidemia Other    Hypertension Other    Kidney disease Other    Colon cancer Mother        dx late 70s   Cervical cancer Maternal Grandmother    Stroke Paternal Grandmother    Breast cancer Maternal Aunt        dx 75s   Breast cancer Cousin        dx 50s    ROS: no fevers or chills, productive cough, hemoptysis, dysphasia, odynophagia, melena, hematochezia, dysuria, hematuria, rash, seizure activity, orthopnea, PND, pedal edema, claudication. Remaining systems are negative.  Physical  Exam: Well-developed well-nourished in no acute distress.  Skin is warm and dry.  HEENT is normal.  Neck is supple.  Chest is clear to auscultation with normal expansion.  Cardiovascular exam is regular rate and rhythm.  2/6 systolic murmur and diastolic murmur left sternal border. Abdominal exam nontender or distended. No masses palpated. Extremities show no edema. neuro grossly intact  EKG Interpretation Date/Time:  Friday August 08 2024 08:18:43 EST Ventricular Rate:  57 PR Interval:  158 QRS Duration:  106 QT Interval:  452 QTC Calculation: 439 R Axis:   -39  Text Interpretation: Sinus bradycardia Left axis deviation Incomplete right bundle branch block Left ventricular hypertrophy ( R in aVL , Cornell product , Romhilt-Estes ) Nonspecific T wave abnormality Confirmed by Pietro Rogue ([id number]) on 08/08/2024 8:20:44 AM     A/P  1 aortic insufficiency-moderate to severe on most recent echocardiogram.  LV function was preserved and no left ventricular enlargement noted.  However she notes increased dyspnea on exertion and minimal increased lower extremity edema.  Question related to her aortic insufficiency.  Increase Lasix  to 40 mg daily.  Check potassium, renal function and BNP in 1 week.  Repeat echo; may require AVR in the future.   2 history of nonischemic cardiomyopathy-LV function improved; repeat study.   3 hypertension-blood pressure elevated.  However she just took her medications and states it is typically controlled.  Will continue to follow and adjust as needed.  Check potassium and renal function.  4 recurrent DVTs-continue Coumadin .  This issue is managed by primary care.  Check hemoglobin.  5 hyperlipidemia-continue statin.  Check lipids and liver.  6 chronic stage III kidney disease-check potassium and renal function.  Rogue Pietro, MD

## 2024-07-31 ENCOUNTER — Ambulatory Visit (INDEPENDENT_AMBULATORY_CARE_PROVIDER_SITE_OTHER)

## 2024-07-31 DIAGNOSIS — Z7901 Long term (current) use of anticoagulants: Secondary | ICD-10-CM | POA: Diagnosis not present

## 2024-07-31 LAB — PROTIME-INR
INR: 2.1 — ABNORMAL HIGH (ref 0.9–1.2)
Prothrombin Time: 21.8 s — ABNORMAL HIGH (ref 9.1–12.0)

## 2024-07-31 NOTE — Progress Notes (Signed)
 Pt tested out of town at a local [facility]. Received result today from lab draw performed yesterday. No POCT INR was performed today. Continue 1 tablet daily except take 1 1/2 tablets on  Thursdays. Recheck in 6 weeks, on Monday, 12/8.  Contacted pt by phone and advised of dosing and recheck date. Pt verbalized understanding. Pt is not sure if she will be in town on 12/8 so a coumadin  clinic apt and a lab order has been added. She will contact the coumadin  clinic and advise when closer to apt.

## 2024-07-31 NOTE — Patient Instructions (Addendum)
 Pre visit review using our clinic review tool, if applicable. No additional management support is needed unless otherwise documented below in the visit note.  Continue 1 tablet daily except take 1 1/2 tablets on  Thursdays. Recheck in 6 weeks, on Monday, 12/8.

## 2024-08-08 ENCOUNTER — Ambulatory Visit: Attending: Cardiology | Admitting: Cardiology

## 2024-08-08 ENCOUNTER — Ambulatory Visit

## 2024-08-08 ENCOUNTER — Encounter: Payer: Self-pay | Admitting: Cardiology

## 2024-08-08 DIAGNOSIS — I10 Essential (primary) hypertension: Secondary | ICD-10-CM

## 2024-08-08 DIAGNOSIS — I5032 Chronic diastolic (congestive) heart failure: Secondary | ICD-10-CM

## 2024-08-08 DIAGNOSIS — I351 Nonrheumatic aortic (valve) insufficiency: Secondary | ICD-10-CM

## 2024-08-08 DIAGNOSIS — E7800 Pure hypercholesterolemia, unspecified: Secondary | ICD-10-CM

## 2024-08-08 DIAGNOSIS — E78019 Familial hypercholesterolemia, unspecified: Secondary | ICD-10-CM

## 2024-08-08 DIAGNOSIS — I428 Other cardiomyopathies: Secondary | ICD-10-CM

## 2024-08-08 MED ORDER — FUROSEMIDE 40 MG PO TABS: 40.0000 mg | ORAL_TABLET | Freq: Every day | ORAL | 0 refills | Status: DC

## 2024-08-08 NOTE — Patient Instructions (Signed)
 Medication Instructions:   INCREASE FUROSEMIDE  TO 40 MG ONCE DAILY  *If you need a refill on your cardiac medications before your next appointment, please call your pharmacy*  Lab Work:  Your physician recommends that you return for lab work in: [facility]  If you have labs (blood work) drawn today and your tests are completely normal, you will receive your results only by: MyChart Message (if you have MyChart) OR A paper copy in the mail If you have any lab test that is abnormal or we need to change your treatment, we will call you to review the results.  Testing/Procedures:  Your physician has requested that you have an echocardiogram. Echocardiography is a painless test that uses sound waves to create images of your heart. It provides your doctor with information about the size and shape of your heart and how well your heart's chambers and valves are working. This procedure takes approximately one hour. There are no restrictions for this procedure. Please do NOT wear cologne, perfume, aftershave, or lotions (deodorant is allowed). Please arrive 15 minutes prior to your appointment time.  Please note: We ask at that you not bring children with you during ultrasound (echo/ vascular) testing. Due to room size and safety concerns, children are not allowed in the ultrasound rooms during exams. Our front office staff cannot provide observation of children in our lobby area while testing is being conducted. An adult accompanying a patient to their appointment will only be allowed in the ultrasound room at the discretion of the ultrasound technician under special circumstances. We apologize for any inconvenience. [address]  Follow-Up: At [facility], you and your health needs are our priority.  As part of our continuing mission to provide you with exceptional heart care, our providers are all part of one team.  This team includes your primary Cardiologist (physician) and  Advanced Practice Providers or APPs (Physician Assistants and Nurse Practitioners) who all work together to provide you with the care you need, when you need it.  Your next appointment:   12 week(s)  Provider:   Redell Shallow, MD

## 2024-08-14 ENCOUNTER — Telehealth: Payer: Self-pay

## 2024-08-14 NOTE — Telephone Encounter (Signed)
 Patient is overdue for an appointment. LMOM for patient to call and schedule.

## 2024-08-18 ENCOUNTER — Telehealth: Payer: Self-pay | Admitting: *Deleted

## 2024-08-18 NOTE — Telephone Encounter (Signed)
 Copied from CRM [id number]. Topic: Appointments - Appointment Scheduling >> Aug 15, 2024  4:02 PM Viola F wrote: Patient returned Arielle's phone call - scheduled physical with Dr. Charlett for 09/17/24

## 2024-08-19 NOTE — Telephone Encounter (Signed)
 Noted

## 2024-08-26 ENCOUNTER — Other Ambulatory Visit: Payer: Self-pay | Admitting: Cardiology

## 2024-08-27 ENCOUNTER — Other Ambulatory Visit: Payer: Self-pay | Admitting: Hematology and Oncology

## 2024-08-27 ENCOUNTER — Ambulatory Visit: Payer: Self-pay | Admitting: Cardiology

## 2024-08-27 DIAGNOSIS — E876 Hypokalemia: Secondary | ICD-10-CM

## 2024-08-27 DIAGNOSIS — Z1231 Encounter for screening mammogram for malignant neoplasm of breast: Secondary | ICD-10-CM

## 2024-08-27 LAB — CBC
Hematocrit: 40.9 % (ref 34.0–46.6)
Hemoglobin: 13.1 g/dL (ref 11.1–15.9)
MCH: 28.7 pg (ref 26.6–33.0)
MCHC: 32 g/dL (ref 31.5–35.7)
MCV: 90 fL (ref 79–97)
Platelets: 192 x10E3/uL (ref 150–450)
RBC: 4.56 x10E6/uL (ref 3.77–5.28)
RDW: 13.1 % (ref 11.7–15.4)
WBC: 7.1 x10E3/uL (ref 3.4–10.8)

## 2024-08-27 LAB — COMPREHENSIVE METABOLIC PANEL WITH GFR
ALT: 13 IU/L (ref 0–32)
AST: 17 IU/L (ref 0–40)
Albumin: 4.1 g/dL (ref 3.8–4.8)
Alkaline Phosphatase: 146 IU/L — ABNORMAL HIGH (ref 49–135)
BUN/Creatinine Ratio: 11 — ABNORMAL LOW (ref 12–28)
BUN: 18 mg/dL (ref 8–27)
Bilirubin Total: 0.5 mg/dL (ref 0.0–1.2)
CO2: 20 mmol/L (ref 20–29)
Calcium: 9.3 mg/dL (ref 8.7–10.3)
Chloride: 103 mmol/L (ref 96–106)
Creatinine, Ser: 1.7 mg/dL — ABNORMAL HIGH (ref 0.57–1.00)
Globulin, Total: 3 g/dL (ref 1.5–4.5)
Glucose: 108 mg/dL — ABNORMAL HIGH (ref 70–99)
Potassium: 3.3 mmol/L — ABNORMAL LOW (ref 3.5–5.2)
Sodium: 142 mmol/L (ref 134–144)
Total Protein: 7.1 g/dL (ref 6.0–8.5)
eGFR: 32 mL/min/1.73 — ABNORMAL LOW (ref >59)

## 2024-08-27 MED ORDER — POTASSIUM CHLORIDE CRYS ER 20 MEQ PO TBCR: 20.0000 meq | EXTENDED_RELEASE_TABLET | Freq: Every day | ORAL | 3 refills | Status: AC

## 2024-09-08 ENCOUNTER — Ambulatory Visit

## 2024-09-08 ENCOUNTER — Telehealth: Payer: Self-pay

## 2024-09-08 ENCOUNTER — Other Ambulatory Visit: Payer: Self-pay | Admitting: Cardiology

## 2024-09-08 DIAGNOSIS — Z7901 Long term (current) use of anticoagulants: Secondary | ICD-10-CM

## 2024-09-08 DIAGNOSIS — I5032 Chronic diastolic (congestive) heart failure: Secondary | ICD-10-CM

## 2024-09-08 MED ORDER — WARFARIN SODIUM 5 MG PO TABS: ORAL_TABLET | ORAL | 1 refills | Status: AC

## 2024-09-08 NOTE — Telephone Encounter (Signed)
 Pt was to go to [facility] today to have INR lab draw. She reports weather is snowing and she will no be able to make it today. She thinks she will be able to go on 12/10. Advised this nurse will f/u with warfarin dosing instructions when result is received. Pt verbalized understanding.   Pt requested refill of warfarin. Pt is compliant with warfarin management and PCP apts.  Sent in refill of warfarin to requested pharmacy.

## 2024-09-17 ENCOUNTER — Encounter: Payer: Self-pay | Admitting: Internal Medicine

## 2024-09-17 ENCOUNTER — Ambulatory Visit: Admitting: Internal Medicine

## 2024-09-17 ENCOUNTER — Ambulatory Visit: Payer: Self-pay

## 2024-09-17 ENCOUNTER — Ambulatory Visit

## 2024-09-17 VITALS — BP 130/72 | HR 56 | Temp 98.0°F | Ht 66.0 in | Wt 239.8 lb

## 2024-09-17 DIAGNOSIS — C773 Secondary and unspecified malignant neoplasm of axilla and upper limb lymph nodes: Secondary | ICD-10-CM

## 2024-09-17 DIAGNOSIS — I890 Lymphedema, not elsewhere classified: Secondary | ICD-10-CM

## 2024-09-17 DIAGNOSIS — E1165 Type 2 diabetes mellitus with hyperglycemia: Secondary | ICD-10-CM

## 2024-09-17 DIAGNOSIS — N183 Chronic kidney disease, stage 3 unspecified: Secondary | ICD-10-CM | POA: Diagnosis not present

## 2024-09-17 DIAGNOSIS — Z7901 Long term (current) use of anticoagulants: Secondary | ICD-10-CM

## 2024-09-17 DIAGNOSIS — C50912 Malignant neoplasm of unspecified site of left female breast: Secondary | ICD-10-CM | POA: Diagnosis not present

## 2024-09-17 DIAGNOSIS — I43 Cardiomyopathy in diseases classified elsewhere: Secondary | ICD-10-CM | POA: Diagnosis not present

## 2024-09-17 DIAGNOSIS — Z0000 Encounter for general adult medical examination without abnormal findings: Secondary | ICD-10-CM

## 2024-09-17 DIAGNOSIS — E1122 Type 2 diabetes mellitus with diabetic chronic kidney disease: Secondary | ICD-10-CM

## 2024-09-17 DIAGNOSIS — Z79899 Other long term (current) drug therapy: Secondary | ICD-10-CM

## 2024-09-17 DIAGNOSIS — E785 Hyperlipidemia, unspecified: Secondary | ICD-10-CM | POA: Diagnosis not present

## 2024-09-17 DIAGNOSIS — I119 Hypertensive heart disease without heart failure: Secondary | ICD-10-CM

## 2024-09-17 LAB — BASIC METABOLIC PANEL WITH GFR
BUN: 15 mg/dL (ref 6–23)
CO2: 27 meq/L (ref 19–32)
Calcium: 9.3 mg/dL (ref 8.4–10.5)
Chloride: 105 meq/L (ref 96–112)
Creatinine, Ser: 1.62 mg/dL — ABNORMAL HIGH (ref 0.40–1.20)
GFR: 31.59 mL/min — ABNORMAL LOW (ref >60.00)
Glucose, Bld: 89 mg/dL (ref 70–99)
Potassium: 4.3 meq/L (ref 3.5–5.1)
Sodium: 141 meq/L (ref 135–145)

## 2024-09-17 LAB — LIPID PANEL
Cholesterol: 127 mg/dL (ref 28–200)
HDL: 46.1 mg/dL (ref >39.00)
LDL Cholesterol: 64 mg/dL (ref 10–99)
NonHDL: 80.61
Total CHOL/HDL Ratio: 3
Triglycerides: 85 mg/dL (ref 10.0–149.0)
VLDL: 17 mg/dL (ref 0.0–40.0)

## 2024-09-17 LAB — TSH: TSH: 2.95 u[IU]/mL (ref 0.35–5.50)

## 2024-09-17 LAB — HEMOGLOBIN A1C: Hgb A1c MFr Bld: 6.4 % (ref 4.6–6.5)

## 2024-09-17 LAB — POCT INR: INR: 2.5 (ref 2.0–3.0)

## 2024-09-17 NOTE — Patient Instructions (Addendum)
 Good to see you today  Updated lab   today  Avoid  sugar drinks to avoid high blood sugars.   Will share results with your team ( potassium etc)   Get shingrix vaccine at pharamcy

## 2024-09-17 NOTE — Progress Notes (Signed)
 Chief Complaint  Patient presents with   Annual Exam    HPI: Patient  [name] Foster  73 y.o. comes in today for Preventive Health Care visit  and monitoring follow up  CV: [place] recent  low potassium added supplement due for bmp and other labs not done as ordered  followed for ht CM   Oncology breast cancer :  stable so far .    Dr Gudena.  Minimal feeling neuropathy toes   left arm lymphedema not wrapped today  doing ok   Coumadin   monitoring  no bleeding  on for hx of recurrent dvt   Health Maintenance  Topic Date Due   FOOT EXAM  Never done   Diabetic kidney evaluation - Urine ACR  Never done   HEMOGLOBIN A1C  11/10/2023   Medicare Annual Wellness (AWV)  03/07/2024   Mammogram  10/10/2024   OPHTHALMOLOGY EXAM  09/17/2024 (Originally 10/18/2019)   Zoster Vaccines- Shingrix (1 of 2) 12/16/2024 (Originally 05/29/1970)   Colonoscopy  09/17/2025 (Originally 05/10/2024)   COVID-19 Vaccine (7 - Pfizer risk 2025-26 season) 12/14/2024   Diabetic kidney evaluation - eGFR measurement  08/26/2025   Pneumococcal Vaccine: 50+ Years  Completed   Influenza Vaccine  Completed   Bone Density Scan  Completed   Hepatitis C Screening  Completed   Meningococcal B Vaccine  Aged Out   DTaP/Tdap/Td  Discontinued   Health Maintenance Review LIFESTYLE:  Exercise:   not reg Tobacco/ETS:n Alcohol : rare Sugar beverages:1 pepsi per day Sleep:4-5 hours  Drug use: no HH of 3 lives with sis and husband  pet small dog in house    ROS:  GEN/ HEENT: No fever, significant weight changes sweats headaches vision problems hearing changes, CV/ PULM; No chest pain shortness of breath cough, syncope,edema  change in exercise tolerance. GI /GU: No adominal pain, vomiting, change in bowel habits. No blood in the stool. No significant GU symptoms. SKIN/HEME: ,no acute skin rashes suspicious lesions or bleeding. No lymphadenopathy, nodules, masses.  NEURO/ PSYCH:  No neurologic signs such as weakness  numbness.  X as per hpi No depression anxiety. IMM/ Allergy: No unusual infections.  Allergy .   REST of 12 system review negative except as per HPI   Past Medical History:  Diagnosis Date   Allergic rhinitis    Anemia    Aortic valve disorders    Arthritis    Asthma    Cancer (HCC)    Breast Cancer   CHF (congestive heart failure) (HCC)    Coronary artery disease    Family history of adverse reaction to anesthesia    difficulty waking mother up after surgery   Family history of breast cancer    Family history of cervical cancer    Family history of colon cancer    Family history of throat cancer    Heart murmur    Hyperlipidemia    Hypertension    Intractable nausea and vomiting 09/21/2019   Long term (current) use of anticoagulants    Other primary cardiomyopathies    Peripheral vascular disease    right leg   Personal history of chemotherapy    Personal history of colonic polyps    Personal history of radiation therapy    Personal history of venous thrombosis and embolism     Past Surgical History:  Procedure Laterality Date   ABDOMINAL HYSTERECTOMY     BREAST BIOPSY Left 07/2019   CARDIAC CATHETERIZATION  2004   DILATION AND CURETTAGE OF  UTERUS     several   MASTECTOMY MODIFIED RADICAL Left 03/09/2020   Left Modified Radical Mastectomy (left mastectomy with axillary lymph node dissection)    MASTECTOMY MODIFIED RADICAL Left 03/09/2020   Procedure: LEFT MODIFIED RADICAL MASTECTOMY;  Surgeon: Aron Shoulders, MD;  Location: [facility] OR;  Service: General;  Laterality: Left;  GEN AND PECTORAL BLOCK   PORT-A-CATH REMOVAL N/A 08/31/2020   Procedure: REMOVAL PORT-A-CATH;  Surgeon: Aron Shoulders, MD;  Location: [facility] OR;  Service: General;  Laterality: N/A;   PORTACATH PLACEMENT Left 08/18/2019   Procedure: INSERTION PORT-A-CATH;  Surgeon: Aron Shoulders, MD;  Location: [facility] OR;  Service: General;  Laterality: Left;   Rt knee arthoscopic     SHOULDER ARTHROSCOPY W/ ROTATOR CUFF  REPAIR Right 07/2016   TEE WITHOUT CARDIOVERSION N/A 02/21/2013   Procedure: TRANSESOPHAGEAL ECHOCARDIOGRAM (TEE);  Surgeon: Redell GORMAN Shallow, MD;  Location[facility];  Service: Cardiovascular;  Laterality: N/A;    Family History  Problem Relation Age of Onset   Alcohol  abuse Other    Depression Other    Hyperlipidemia Other    Hypertension Other    Kidney disease Other    Colon cancer Mother        dx late 51s   Cervical cancer Maternal Grandmother    Stroke Paternal Grandmother    Breast cancer Maternal Aunt        dx 46s   Breast cancer Cousin        dx 30s    Social History   Socioeconomic History   Marital status: Legally Separated    Spouse name: Not on file   Number of children: Not on file   Years of education: Not on file   Highest education level: Some college, no degree  Occupational History    Comment: retired  Tobacco Use   Smoking status: Former    Current packs/day: 0.00    Average packs/day: 0.3 packs/day    Types: Cigarettes    Quit date: 07/02/2019    Years since quitting: 5.2   Smokeless tobacco: Never  Vaping Use   Vaping status: Never Used  Substance and Sexual Activity   Alcohol  use: Yes    Alcohol /week: 1.0 standard drink of alcohol     Types: 1 Glasses of wine per week    Comment: rare occasion   Drug use: No   Sexual activity: Not on file    Comment: Hysterectomy  Other Topics Concern   Not on file  Social History Narrative   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    Divorced   Regular exercise- no   5 hours sleep    Lives with a son age 86    No pets         Social Drivers of Health   Tobacco Use: Medium Risk (09/17/2024)   Patient History    Smoking Tobacco Use: Former    Smokeless Tobacco Use: Never    Passive Exposure: Not on file  Financial Resource Strain: Patient Declined (09/15/2024)   Overall Financial Resource Strain (CARDIA)    Difficulty of Paying Living Expenses: Patient declined  Food Insecurity:  Food Insecurity Present (09/15/2024)   Epic    Worried About Programme Researcher, Broadcasting/film/video in the Last Year: Never true    Ran Out of Food in the Last Year: Sometimes true  Transportation Needs: No Transportation Needs (09/15/2024)   Epic    Lack of Transportation (Medical): No    Lack of Transportation (Non-Medical): No  Physical Activity: Insufficiently Active (09/15/2024)   Exercise Vital Sign    Days of Exercise per Week: 1 day    Minutes of Exercise per Session: 30 min  Stress: No Stress Concern Present (09/15/2024)   [employer] of Occupational Health - Occupational Stress Questionnaire    Feeling of Stress: Not at all  Social Connections: Unknown (09/15/2024)   Social Connection and Isolation Panel    Frequency of Communication with Friends and Family: Patient declined    Frequency of Social Gatherings with Friends and Family: Patient declined    Attends Religious Services: Patient declined    Active Member of Clubs or Organizations: No    Attends Engineer, Structural: Not on file    Marital Status: Patient declined  Depression (PHQ2-9): Low Risk (09/17/2024)   Depression (PHQ2-9)    PHQ-2 Score: 0  Alcohol  Screen: Low Risk (09/15/2024)   Alcohol  Screen    Last Alcohol  Screening Score (AUDIT): 2  Housing: Unknown (09/15/2024)   Epic    Unable to Pay for Housing in the Last Year: Patient declined    Number of Times Moved in the Last Year: 1    Homeless in the Last Year: No  Utilities: Not At Risk (03/08/2023)   AHC Utilities    Threatened with loss of utilities: No  Health Literacy: Not on file    Outpatient Medications Prior to Visit  Medication Sig Dispense Refill   Albuterol  Sulfate (PROAIR  RESPICLICK) 108 (90 Base) MCG/ACT AEPB Inhale 2 puffs into the lungs every 6 (six) hours as needed. 2 each 1   amLODipine  (NORVASC ) 10 MG tablet TAKE 1 TABLET BY MOUTH DAILY 100 tablet 2   atorvastatin  (LIPITOR) 20 MG tablet TAKE 1 TABLET BY MOUTH ONCE  DAILY 100 tablet 2    Carboxymethylcellul-Glycerin  (CLEAR EYES FOR DRY EYES) 1-0.25 % SOLN Place 1 drop into both eyes 3 (three) times daily as needed (dry eyes).      Cholecalciferol  (VITAMIN D ) 50 MCG (2000 UT) tablet Take 2,000 Units by mouth daily.     fexofenadine  (ALLEGRA ) 180 MG tablet Take 180 mg by mouth daily as needed for allergies.      hydrALAZINE  (APRESOLINE ) 50 MG tablet Take 1 tablet (50 mg total) by mouth 3 (three) times daily. 300 tablet 2   metoprolol  tartrate (LOPRESSOR ) 50 MG tablet TAKE 1 TABLET BY MOUTH TWICE  DAILY 200 tablet 2   potassium chloride  SA (KLOR-CON  M) 20 MEQ tablet Take 1 tablet (20 mEq total) by mouth daily. 90 tablet 3   sodium chloride  (OCEAN) 0.65 % nasal spray Place into the nose as needed.     warfarin (COUMADIN ) 5 MG tablet TAKE 1 TABLET BY MOUTH DAILY EXCEPT TAKE 1 AND 1/2 TABLETS BY MOUTH ON THURSDAYS OR AS  DIRECTED BY ANTICOAGULATION  CLINIC 110 tablet 1   fluticasone  (FLONASE ) 50 MCG/ACT nasal spray USE 2 SPRAYS IN EACH NOSTRIL DAILY. PT NEEDS TO SCHEDULE A FOLLOW UP APPT BEFORE NEXT REFILL. (Patient not taking: Reported on 09/17/2024) 16 g 0   furosemide  (LASIX ) 40 MG tablet Take 1 tablet (40 mg total) by mouth daily. 90 tablet 3   No facility-administered medications prior to visit.     EXAM:  BP 130/72 (BP Location: Right Arm, Patient Position: Sitting, Cuff Size: Large)   Pulse (!) 56   Temp 98 F (36.7 C) (Oral)   Ht 5' 6 (1.676 m)   Wt 239 lb 12.8 oz (108.8 kg)   LMP  (LMP  Unknown)   SpO2 99%   BMI 38.70 kg/m   Body mass index is 38.7 kg/m. Wt Readings from Last 3 Encounters:  09/17/24 239 lb 12.8 oz (108.8 kg)  08/08/24 237 lb 3.2 oz (107.6 kg)  11/20/23 225 lb 12.8 oz (102.4 kg)    Physical Exam: Vital signs reviewed HZW:Uypd is a well-developed well-nourished alert cooperative    who appearsr stated age in no acute distress.  HEENT: normocephalic atraumatic , Eyes: PERRL EOM's full, conjunctiva clear, Nares: paten,t no deformity discharge or  tenderness., Ears: no deformity EAC's clear TMs with normal landmarks. Mouth: clear OP, no lesions, edema.  Moist mucous membranes. Dentition in adequate repair. NECK: supple without masses, thyromegaly or bruits. CHEST/PULM:  Clear to auscultation and percussion breath sounds equal no wheeze , rales or rhonchi Breast: normal by inspection . No dimpling, discharge, masses, tenderness or discharge . CV: PMI is nondisplaced, S1 S2 no gallops, murmurs, rubs. Peripheral pulses are full without delay.  ABDOMEN: Bowel sounds normal nontender  No guard or rebound, no hepato splenomegal no CVA tenderness. Extremtities:  No clubbing cyanosis or edema, no acute joint swelling or redness no focal atrophy NEURO:  Oriented x3, cranial nerves 3-12 appear to be intact, no obvious focal weakness,gait within normal limits no abnormal reflexes or asymmetrical SKIN: No acute rashes normal turgor, color, no bruising or petechiae. PSYCH: Oriented, good eye contact, no obvious depression anxiety, cognition and judgment appear normal. LN: no cervical axillary adenopathy  Lab Results  Component Value Date   WBC 7.1 08/26/2024   HGB 13.1 08/26/2024   HCT 40.9 08/26/2024   PLT 192 08/26/2024   GLUCOSE 108 (H) 08/26/2024   CHOL 159 05/10/2023   TRIG 110.0 05/10/2023   HDL 56.40 05/10/2023   LDLDIRECT 175.9 07/01/2007   LDLCALC 81 05/10/2023   ALT 13 08/26/2024   AST 17 08/26/2024   NA 142 08/26/2024   K 3.3 (L) 08/26/2024   CL 103 08/26/2024   CREATININE 1.70 (H) 08/26/2024   BUN 18 08/26/2024   CO2 20 08/26/2024   TSH 1.84 05/10/2023   INR 2.5 09/17/2024   HGBA1C 6.1 05/10/2023    BP Readings from Last 3 Encounters:  09/17/24 130/72  08/08/24 (!) 152/86  11/20/23 137/67    Lab results reviewed with patient   ASSESSMENT AND PLAN:  Discussed the following assessment and plan:    ICD-10-CM   1. Visit for preventive health examination  Z00.00     2. Long term (current) use of anticoagulants   Z79.01 POC INR    Lipid panel    TSH    Hemoglobin A1c    Basic metabolic panel with GFR    Pro b natriuretic peptide    3. Cardiomyopathy due to hypertension, without heart failure (HCC)  I11.9 Lipid panel   I43 TSH    Hemoglobin A1c    Basic metabolic panel with GFR    Pro b natriuretic peptide    Microalbumin / creatinine urine ratio    4. Medication management  Z79.899 Lipid panel    TSH    Hemoglobin A1c    Basic metabolic panel with GFR    Pro b natriuretic peptide    Microalbumin / creatinine urine ratio    5. Stage 3 chronic kidney disease, unspecified whether stage 3a or 3b CKD (HCC)  N18.30 Lipid panel    TSH    Hemoglobin A1c    Basic metabolic panel with GFR    Pro b  natriuretic peptide    Microalbumin / creatinine urine ratio   checl cr umicro today    6. Breast cancer metastasized to axillary lymph node, left (HCC)  C50.912 Lipid panel   C77.3 TSH    Hemoglobin A1c    Basic metabolic panel with GFR    Pro b natriuretic peptide    7. Type 2 diabetes mellitus with hyperglycemia, without long-term current use of insulin (HCC)  E11.65 Lipid panel    TSH    Hemoglobin A1c    Basic metabolic panel with GFR    Pro b natriuretic peptide   a1c has been in range    8. Hyperlipidemia, unspecified hyperlipidemia type  E78.5 Lipid panel    TSH    Hemoglobin A1c    Basic metabolic panel with GFR    Pro b natriuretic peptide    9. Lymphedema of left arm  I89.0    does home rx pt    Hx of elevated A1c in dm range but  below 6.5 for a while  Inr 2.5  Will do fu lab potassium etc . Send results to cards team  Disc ls attention  Advise  shingles vaccine discussed  Return for depending on results 6-12 mos  and with shannon for inr .  Patient Care Team: Mahalie Kanner, Apolinar POUR, MD as PCP - General (Internal Medicine) Pietro Redell RAMAN, MD as PCP - Cardiology (Cardiology) Pietro Redell RAMAN, MD (Cardiology) Duwayne Purchase, MD (Orthopedic Surgery) Tyree Nanetta SAILOR, RN  as Oncology Nurse Navigator Odean Potts, MD as Consulting Physician (Hematology and Oncology) [facility] Ethyl Kirsch, RN as Registered Nurse Patient Instructions  Good to see you today  Updated lab   today  Avoid  sugar drinks to avoid high blood sugars.   Will share results with your team ( potassium etc)   Get shingrix vaccine at pharamcy  Carren Blakley K. Keta Vanvalkenburgh M.D.

## 2024-09-17 NOTE — Progress Notes (Signed)
 Pt usually tests out of town at a local [facility], but was in town this morning and had apt with PCP and had POCT INR performed. Then result was relayed to this nurse. Continue 1 tablet daily except take 1 1/2 tablets on  Thursdays. Recheck in 6 weeks, on Monday, 1/26.  Contacted pt by phone and advised of dosing and recheck date. Pt verbalized understanding.

## 2024-09-17 NOTE — Patient Instructions (Addendum)
 Pre visit review using our clinic review tool, if applicable. No additional management support is needed unless otherwise documented below in the visit note.  Continue 1 tablet daily except take 1 1/2 tablets on  Thursdays. Recheck in 6 weeks, on Monday, 1/26.

## 2024-09-18 LAB — MICROALBUMIN / CREATININE URINE RATIO
Creatinine,U: 95.3 mg/dL
Microalb Creat Ratio: 190.3 mg/g — ABNORMAL HIGH (ref 0.0–30.0)
Microalb, Ur: 18.1 mg/dL — ABNORMAL HIGH (ref 0.7–1.9)

## 2024-09-18 LAB — PRO B NATRIURETIC PEPTIDE: NT-Pro BNP: 430 pg/mL — ABNORMAL HIGH (ref 0–301)

## 2024-10-06 NOTE — Progress Notes (Signed)
 "    HPI: FU nonischemic cardiomyopathy and aortic insufficiency. Previous catheterization in 2004 showed no obstructive coronary disease. CT of her chest in Nov 2008 showed no thoracic aneurysm. Previous Holter monitor secondary to dizzy spells showed PACs and PVCs. Nuclear study April 2014 showed an ejection fraction of 62% and normal perfusion. Patient underwent transesophageal echocardiogram in May of 2014. Her ejection fraction was 50-55%. There was moderate aortic insufficiency and mild mitral regurgitation. There was a linear density associated with the atrial septum of uncertain etiology. Patient has had occasional noncompliance in the past with medications. If blood pressure not controlled LV function noted to be reduced with worsening aortic insufficiency. Her ACE inhibitor was discontinued previously because of dehydration/renal insufficiency. Echocardiogram January 2026 showed normal LV function, left ventricular end-systolic dimension 3.6 cm, mild mitral regurgitation, moderate to severe aortic insufficiency.  Since I last saw her, she has dyspnea on exertion improved compared to previous but persistent.  She continues to have minimal pedal edema also improved.  She denies chest pain or syncope.  Current Outpatient Medications  Medication Sig Dispense Refill   Albuterol  Sulfate (PROAIR  RESPICLICK) 108 (90 Base) MCG/ACT AEPB Inhale 2 puffs into the lungs every 6 (six) hours as needed. 2 each 1   amLODipine  (NORVASC ) 10 MG tablet TAKE 1 TABLET BY MOUTH DAILY 100 tablet 2   atorvastatin  (LIPITOR) 20 MG tablet TAKE 1 TABLET BY MOUTH ONCE  DAILY 100 tablet 2   Carboxymethylcellul-Glycerin  (CLEAR EYES FOR DRY EYES) 1-0.25 % SOLN Place 1 drop into both eyes 3 (three) times daily as needed (dry eyes).      Cholecalciferol  (VITAMIN D ) 50 MCG (2000 UT) tablet Take 2,000 Units by mouth daily.     fexofenadine  (ALLEGRA ) 180 MG tablet Take 180 mg by mouth daily as needed for allergies.       fluticasone  (FLONASE ) 50 MCG/ACT nasal spray USE 2 SPRAYS IN EACH NOSTRIL DAILY. PT NEEDS TO SCHEDULE A FOLLOW UP APPT BEFORE NEXT REFILL. 16 g 0   furosemide  (LASIX ) 40 MG tablet Take 1 tablet (40 mg total) by mouth daily. 90 tablet 3   hydrALAZINE  (APRESOLINE ) 50 MG tablet Take 1 tablet (50 mg total) by mouth 3 (three) times daily. 300 tablet 2   metoprolol  tartrate (LOPRESSOR ) 50 MG tablet TAKE 1 TABLET BY MOUTH TWICE  DAILY 200 tablet 2   potassium chloride  SA (KLOR-CON  M) 20 MEQ tablet Take 1 tablet (20 mEq total) by mouth daily. 90 tablet 3   sodium chloride  (OCEAN) 0.65 % nasal spray Place into the nose as needed.     warfarin (COUMADIN ) 5 MG tablet TAKE 1 TABLET BY MOUTH DAILY EXCEPT TAKE 1 AND 1/2 TABLETS BY MOUTH ON THURSDAYS OR AS  DIRECTED BY ANTICOAGULATION  CLINIC 110 tablet 1   No current facility-administered medications for this visit.     Past Medical History:  Diagnosis Date   Allergic rhinitis    Anemia    Aortic valve disorders    Arthritis    Asthma    Cancer (HCC)    Breast Cancer   CHF (congestive heart failure) (HCC)    Coronary artery disease    Family history of adverse reaction to anesthesia    difficulty waking mother up after surgery   Family history of breast cancer    Family history of cervical cancer    Family history of colon cancer    Family history of throat cancer    Heart murmur  Hyperlipidemia    Hypertension    Intractable nausea and vomiting 09/21/2019   Long term (current) use of anticoagulants    Other primary cardiomyopathies    Peripheral vascular disease    right leg   Personal history of chemotherapy    Personal history of colonic polyps    Personal history of radiation therapy    Personal history of venous thrombosis and embolism     Past Surgical History:  Procedure Laterality Date   ABDOMINAL HYSTERECTOMY     BREAST BIOPSY Left 07/2019   CARDIAC CATHETERIZATION  2004   DILATION AND CURETTAGE OF UTERUS     several    MASTECTOMY MODIFIED RADICAL Left 03/09/2020   Left Modified Radical Mastectomy (left mastectomy with axillary lymph node dissection)    MASTECTOMY MODIFIED RADICAL Left 03/09/2020   Procedure: LEFT MODIFIED RADICAL MASTECTOMY;  Surgeon: Aron Shoulders, MD;  Location: [facility] OR;  Service: General;  Laterality: Left;  GEN AND PECTORAL BLOCK   PORT-A-CATH REMOVAL N/A 08/31/2020   Procedure: REMOVAL PORT-A-CATH;  Surgeon: Aron Shoulders, MD;  Location: [facility] OR;  Service: General;  Laterality: N/A;   PORTACATH PLACEMENT Left 08/18/2019   Procedure: INSERTION PORT-A-CATH;  Surgeon: Aron Shoulders, MD;  Location: [facility] OR;  Service: General;  Laterality: Left;   Rt knee arthoscopic     SHOULDER ARTHROSCOPY W/ ROTATOR CUFF REPAIR Right 07/2016   TEE WITHOUT CARDIOVERSION N/A 02/21/2013   Procedure: TRANSESOPHAGEAL ECHOCARDIOGRAM (TEE);  Surgeon: Redell GORMAN Shallow, MD;  Location: [facility];  Service: Cardiovascular;  Laterality: N/A;    Social History   Socioeconomic History   Marital status: Legally Separated    Spouse name: Not on file   Number of children: Not on file   Years of education: Not on file   Highest education level: Some college, no degree  Occupational History    Comment: retired  Tobacco Use   Smoking status: Former    Current packs/day: 0.00    Average packs/day: 0.3 packs/day    Types: Cigarettes    Quit date: 07/02/2019    Years since quitting: 5.2   Smokeless tobacco: Never  Vaping Use   Vaping status: Never Used  Substance and Sexual Activity   Alcohol  use: Yes    Alcohol /week: 1.0 standard drink of alcohol     Types: 1 Glasses of wine per week    Comment: rare occasion   Drug use: No   Sexual activity: Not on file    Comment: Hysterectomy  Other Topics Concern   Not on file  Social History Narrative   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    Divorced   Regular exercise- no   5 hours sleep    Lives with a son age 88    No pets         Social Drivers of  Health   Tobacco Use: Medium Risk (10/17/2024)   Patient History    Smoking Tobacco Use: Former    Smokeless Tobacco Use: Never    Passive Exposure: Not on file  Financial Resource Strain: Patient Declined (09/15/2024)   Overall Financial Resource Strain (CARDIA)    Difficulty of Paying Living Expenses: Patient declined  Food Insecurity: Food Insecurity Present (09/15/2024)   Epic    Worried About Programme Researcher, Broadcasting/film/video in the Last Year: Never true    Ran Out of Food in the Last Year: Sometimes true  Transportation Needs: No Transportation Needs (09/15/2024)   Epic    Lack of Transportation (  Medical): No    Lack of Transportation (Non-Medical): No  Physical Activity: Insufficiently Active (09/15/2024)   Exercise Vital Sign    Days of Exercise per Week: 1 day    Minutes of Exercise per Session: 30 min  Stress: No Stress Concern Present (09/15/2024)   [employer] of Occupational Health - Occupational Stress Questionnaire    Feeling of Stress: Not at all  Social Connections: Unknown (09/15/2024)   Social Connection and Isolation Panel    Frequency of Communication with Friends and Family: Patient declined    Frequency of Social Gatherings with Friends and Family: Patient declined    Attends Religious Services: Patient declined    Active Member of Clubs or Organizations: No    Attends Engineer, Structural: Not on file    Marital Status: Patient declined  Intimate Partner Violence: Not on file  Depression (PHQ2-9): Low Risk (09/17/2024)   Depression (PHQ2-9)    PHQ-2 Score: 0  Alcohol  Screen: Low Risk (09/15/2024)   Alcohol  Screen    Last Alcohol  Screening Score (AUDIT): 2  Housing: Unknown (09/15/2024)   Epic    Unable to Pay for Housing in the Last Year: Patient declined    Number of Times Moved in the Last Year: 1    Homeless in the Last Year: No  Utilities: Not At Risk (03/08/2023)   AHC Utilities    Threatened with loss of utilities: No  Health Literacy: Not  on file    Family History  Problem Relation Age of Onset   Alcohol  abuse Other    Depression Other    Hyperlipidemia Other    Hypertension Other    Kidney disease Other    Colon cancer Mother        dx late 80s   Cervical cancer Maternal Grandmother    Stroke Paternal Grandmother    Breast cancer Maternal Aunt        dx 46s   Breast cancer Cousin        dx 50s    ROS: no fevers or chills, productive cough, hemoptysis, dysphasia, odynophagia, melena, hematochezia, dysuria, hematuria, rash, seizure activity, orthopnea, PND, pedal edema, claudication. Remaining systems are negative.  Physical Exam: Well-developed well-nourished in no acute distress.  Skin is warm and dry.  HEENT is normal.  Neck is supple.  Chest is clear to auscultation with normal expansion.  Cardiovascular exam is regular rate and rhythm.  2/6 diastolic murmur left sternal border. Abdominal exam nontender or distended. No masses palpated. Extremities show trace edema. neuro grossly intact   A/P  1 aortic insufficiency-moderate to severe on most recent echocardiogram.  LV function was normal and there was no left ventricular dilatation.  However patient is now symptomatic.  I think we should consider evaluation for aortic valve replacement.  Will arrange right and left cardiac catheterization.  The risk and benefits including myocardial infarction, CVA and death discussed and she agrees to proceed.  Would limit dye given baseline renal insufficiency.  No ventriculogram.  Will also arrange transesophageal echocardiogram to better assess severity of aortic insufficiency and valve morphology.  She would likely require aortic valve replacement in the near future.  2 hypertension-patient's blood pressure is controlled.  Continue present medical regimen.  3 history of nonischemic cardiomyopathy-LV function was normal on most recent echocardiogram.   4 recurrent DVTs-continue Coumadin  per primary care.  5  hyperlipidemia-continue statin.  6 chronic stage III kidney disease-hold Lasix  the day before and day of cardiac catheterization as well  as potassium.  Resume the day after.  Limit dye and no ventriculogram at time of catheterization.  Redell Shallow, MD    "

## 2024-10-13 ENCOUNTER — Ambulatory Visit: Payer: Self-pay | Admitting: Internal Medicine

## 2024-10-13 NOTE — Progress Notes (Signed)
 Urine shows increase protein  with continued stable  decrease gfr ,  potassium better  in range   A1c is stable  controlled   DM 6.4   should be checked again in  6 mos  I suggest  you see nephrology or urology   to follow the kidney  mass area  if not already  doing  or if  oncology doing .  Not sure of the follow up. .( Mri done in 2024 by dr Odean )

## 2024-10-16 ENCOUNTER — Ambulatory Visit
Admission: RE | Admit: 2024-10-16 | Discharge: 2024-10-16 | Disposition: A | Source: Ambulatory Visit | Attending: Hematology and Oncology

## 2024-10-16 ENCOUNTER — Ambulatory Visit (HOSPITAL_COMMUNITY)
Admission: RE | Admit: 2024-10-16 | Discharge: 2024-10-16 | Disposition: A | Discharge status: Home / Self Care | Source: Ambulatory Visit | Attending: Internal Medicine | Admitting: Internal Medicine

## 2024-10-16 ENCOUNTER — Other Ambulatory Visit: Payer: Self-pay | Admitting: Hematology and Oncology

## 2024-10-16 DIAGNOSIS — Z1231 Encounter for screening mammogram for malignant neoplasm of breast: Secondary | ICD-10-CM

## 2024-10-16 DIAGNOSIS — I351 Nonrheumatic aortic (valve) insufficiency: Secondary | ICD-10-CM | POA: Insufficient documentation

## 2024-10-16 LAB — ECHOCARDIOGRAM COMPLETE
AR max vel: 1.18 cm2
AV Area VTI: 1.17 cm2
AV Area mean vel: 1.13 cm2
AV Mean grad: 8.2 mmHg
AV Peak grad: 15.2 mmHg
Ao pk vel: 1.95 m/s
Area-P 1/2: 2.71 cm2
P 1/2 time: 522 ms
S' Lateral: 3.6 cm

## 2024-10-17 ENCOUNTER — Encounter: Payer: Self-pay | Admitting: Cardiology

## 2024-10-17 ENCOUNTER — Ambulatory Visit: Attending: Cardiology | Admitting: Cardiology

## 2024-10-17 ENCOUNTER — Telehealth: Payer: Self-pay | Admitting: Cardiology

## 2024-10-17 VITALS — BP 130/58 | HR 60 | Ht 66.0 in | Wt 237.8 lb

## 2024-10-17 DIAGNOSIS — I10 Essential (primary) hypertension: Secondary | ICD-10-CM | POA: Diagnosis not present

## 2024-10-17 DIAGNOSIS — I5032 Chronic diastolic (congestive) heart failure: Secondary | ICD-10-CM

## 2024-10-17 DIAGNOSIS — I428 Other cardiomyopathies: Secondary | ICD-10-CM

## 2024-10-17 DIAGNOSIS — I351 Nonrheumatic aortic (valve) insufficiency: Secondary | ICD-10-CM

## 2024-10-17 DIAGNOSIS — E7800 Pure hypercholesterolemia, unspecified: Secondary | ICD-10-CM | POA: Diagnosis not present

## 2024-10-17 NOTE — Patient Instructions (Signed)
 "  Testing/Procedures:  Your physician has requested that you have a TEE. During a TEE, sound waves are used to create images of your heart. It provides your doctor with information about the size and shape of your heart and how well your hearts chambers and valves are working. In this test, a transducer is attached to the end of a flexible tube thats guided down your throat and into your esophagus (the tube leading from you mouth to your stomach) to get a more detailed image of your heart. You are not awake for the procedure. Please see the instruction sheet given to you today. For further information please visit [email or web address]    Follow-Up: At [facility], you and your health needs are our priority.  As part of our continuing mission to provide you with exceptional heart care, our providers are all part of one team.  This team includes your primary Cardiologist (physician) and Advanced Practice Providers or APPs (Physician Assistants and Nurse Practitioners) who all work together to provide you with the care you need, when you need it.  Your next appointment:   6-8 week(s)  Provider:   Redell Shallow, MD       You are scheduled for a TEE (Transesophageal Echocardiogram) on Tuesday, February 17 with Dr. DELFORD.  Please arrive at the [facility] (Main Entrance A) at [facility]: [address] [place], [place] [address] at 6:45 AM   Free valet parking service is available. You will check in at ADMITTING.   DIET:  Nothing to eat or drink after midnight except a sip of water  with medications (see medication instructions below)    LABS: 11/13/24  FYI:  For your safety, and to allow us  to monitor your vital signs accurately during the surgery/procedure we request: If you have artificial nails, gel coating, SNS etc, please have those removed prior to your surgery/procedure. Not having the nail coverings /polish removed may result in cancellation or delay of your  surgery/procedure.  Your support person will be asked to wait in the waiting room during your procedure.  It is OK to have someone drop you off and come back when you are ready to be discharged.  You cannot drive after the procedure and will need someone to drive you home.  Bring your insurance cards.  *Special Note: Every effort is made to have your procedure done on time. Occasionally there are emergencies that occur at the hospital that may cause delays. Please be patient if a delay does occur.          Cardiac Catheterization   You are scheduled for a Cardiac Catheterization on Tuesday, February 17 with Dr. Lonni Cash.  1. Please arrive at the [facility] (Main Entrance A) at [facility]: [address] [place], [place] [address] at 6:45 AM   Free valet parking service is available. You will check in at ADMITTING. The support person will be asked to wait in the waiting room.  It is OK to have someone drop you off and come back when you are ready to be discharged.        Special note: Every effort is made to have your procedure done on time. Please understand that emergencies sometimes delay scheduled procedures.  2. Diet: NPO: Nothing to eat OR drink after midnight. (For TEE and Cath the same day)  3. Hydration:You need to be well hydrated before your procedure. On February 17, you may drink approved liquids (see below) until  2 hours before the procedure, with 16 oz of water  as your last intake.   List of approved liquids water , clear juice, clear tea, black coffee, fruit juices, non-citric and without pulp, carbonated beverages, Gatorade, Kool -Aid, plain Jello-O and plain ice popsicles.  4. Labs: You will need to have blood drawn on 11/13/24  5. TAKE THE LAST DOSE OF WARFARIN 4 DAYS PRIOR TO THE PROCEDURE=11/14/24  DO NOT TAKE FUROSEMIDE  2/16 OR 2/17  On the morning of your procedure,  any morning medicines NOT listed above.  You may use sips of water .  6.  Plan to go home the same day, you will only stay overnight if medically necessary. 7. You MUST have a responsible adult to drive you home. 8. An adult MUST be with you the first 24 hours after you arrive home. 9. Bring a current list of your medications, and the last time and date medication taken. 10. Bring ID and current insurance cards. 11.Please wear clothes that are easy to get on and off and wear slip-on shoes.  Thank you for allowing us  to care for you!   -- [facility]              "

## 2024-10-17 NOTE — Telephone Encounter (Signed)
 Hi. Pt needs a 6-8wk fu with C[facility]shaw on the last week of February or first week of March. Pt preference: any day & morning after 10 AM.

## 2024-10-17 NOTE — Addendum Note (Signed)
 Addended by: RICHIE ADRIEN ORN on: 10/17/2024 09:11 AM   Modules accepted: Orders

## 2024-10-28 ENCOUNTER — Telehealth: Payer: Self-pay

## 2024-10-28 NOTE — Telephone Encounter (Signed)
 I'm not sure ,  usually wouldn't order  bridge for short term procedure as opposed to surgery where I would advise bridging.   I guess we could ask cardiology what they think  ? Her risk dx  is hx of dvt and  hypercoagulable state .

## 2024-10-28 NOTE — Telephone Encounter (Signed)
 Pt returned call. She reports she will have to go out of town again soon. Requested apt tomorrow but did not agree with available times. She is going to check at her GI apt tomorrow if they have a [facility] she can have a lab INR draw. She will f/u with coumadin  clinic .  She reports she is scheduled for a colonoscopy for 2/9 and a TEE on 2/17. Cardiology note reports pt should take last dose of warfarin on 2/13.  Pt will have to stop warfarin on 2/5 for the the colonoscopy on 2/9 and would normally restart warfarin on 2/10. This would only have pt on warfarin for 4 days before she is to hold warfarin again per cardiology.   Advised pt a msg will be sent to PCP inquiring if she recommends a Lovenox  bridge.   Lovenox  bridge was used for prior surgeries.  Lovenox  dose for prior surgery was 1mg /kg BID

## 2024-10-28 NOTE — Telephone Encounter (Signed)
 Pt was due to have INR check yesterday. She has been out of town a lot and has been going to a local [facility] for the an INR lab draw. Did not receive result today.  LVM to return call.  Review of chart reveals pt is scheduled for a TEE for 2/17. Per cardiology note on 1/16 pt is to 5. TAKE THE LAST DOSE OF WARFARIN 4 DAYS PRIOR TO THE PROCEDURE=11/14/24

## 2024-10-29 ENCOUNTER — Telehealth: Payer: Self-pay

## 2024-10-29 ENCOUNTER — Ambulatory Visit: Admitting: Internal Medicine

## 2024-10-29 ENCOUNTER — Encounter: Payer: Self-pay | Admitting: Internal Medicine

## 2024-10-29 ENCOUNTER — Ambulatory Visit

## 2024-10-29 VITALS — BP 150/78 | HR 56 | Ht 66.0 in | Wt 237.4 lb

## 2024-10-29 DIAGNOSIS — I351 Nonrheumatic aortic (valve) insufficiency: Secondary | ICD-10-CM

## 2024-10-29 DIAGNOSIS — Z7901 Long term (current) use of anticoagulants: Secondary | ICD-10-CM

## 2024-10-29 DIAGNOSIS — Z800 Family history of malignant neoplasm of digestive organs: Secondary | ICD-10-CM

## 2024-10-29 DIAGNOSIS — Z860101 Personal history of adenomatous and serrated colon polyps: Secondary | ICD-10-CM | POA: Diagnosis not present

## 2024-10-29 DIAGNOSIS — Z860100 Personal history of colon polyps, unspecified: Secondary | ICD-10-CM

## 2024-10-29 LAB — POCT INR: INR: 2.5 (ref 2.0–3.0)

## 2024-10-29 MED ORDER — NA SULFATE-K SULFATE-MG SULF 17.5-3.13-1.6 GM/177ML PO SOLN: 1.0000 | ORAL | 0 refills | Status: AC

## 2024-10-29 NOTE — Telephone Encounter (Signed)
 Ok as per anticoagulation protochol anticoag team

## 2024-10-29 NOTE — Telephone Encounter (Signed)
 I have left [name] Foster a detailed voice mail message that we have cancelled her colonoscopy in the [facility] and set her up for a colonoscopy at [facility] for 11/27/2024. Per [name] Foster she needs to be done at the hospital.   Her instructions have been sent to her [facility] and I will mail them if she wants me too.   Per Dr Federico we are requesting coumadin  clearance and also cardiac clearance.

## 2024-10-29 NOTE — Telephone Encounter (Signed)
" °  Pa Tennant Jan 15, 1951 [id number]     Dear Dr Charlett:  We have scheduled the above named patient for a(n) colonoscopy procedure. Our records show that (s)he is on anticoagulation therapy.  Please advise as to whether the patient may come off their therapy of warfarin 5 days prior to their procedure which is scheduled for 11/10/2024.  Please route your response to Saif Peter, CMA or fax response to [phone] or [phone].   Sincerely,    [facility]   "

## 2024-10-29 NOTE — Telephone Encounter (Signed)
  [facility] Pre-operative Risk Assessment     Request for surgical clearance:     Endoscopy Procedure  What type of surgery is being performed?     colonoscopy  When is this surgery scheduled?     11/27/2024  What type of clearance is required ?   cardiac  Practice name and name of physician performing surgery?      [facility]  What is your office phone and fax number?      Phone- [phone]  Fax- [phone]   Anesthesia type (None, local, MAC, general) ?       MAC   Please route your response to Khadeejah Castner, CMA (AAMA)

## 2024-10-29 NOTE — Telephone Encounter (Signed)
 Patient was recently seen by Dr. Pietro on 10/17/2024 at which time a R/ LHC and TEE were arranged for further evaluation of moderate to severe aortic insufficiency. Both procedures are scheduled for 11/18/2024. Will wait for these before finalizing risk assessment.

## 2024-10-29 NOTE — Addendum Note (Signed)
 Addended by: Arabel Barcenas E on: 10/29/2024 12:21 PM   Modules accepted: Orders

## 2024-10-29 NOTE — Progress Notes (Signed)
 Indication: hx or recurrent DVT, hypercoagulable state Pt is scheduled for TEE and R/L HC for 2/17 and was scheduled for a colonoscopy for 2/9 but this has been RS for 2/26. Per cardiology, lovenox  bridge is not needed, but cardiology will reassess risk of colonoscopy after results from TEE.  Per Dr. Pietro note; advised pt no bridge is needed for cardiac surgery but he advised to stay active while off of warfarin. He advised she could start Eliquis the day after surgery if this not a financial burden. Advised pt to inquire with her insurance on cost. Advised pt this nurse will f/u with her concerning any changes and her warfarin dosing around colonoscopy scheduled for 2/26, if pt is still taking warfarin and has not transitioned to Eiquis.  Continue 1 tablet daily except take 1 1/2 tablets on  Thursdays. Recheck in 4 weeks, approximately one week after cardiology surgery.

## 2024-10-29 NOTE — Progress Notes (Addendum)
 "  Primary Care Physician:  Charlett Apolinar POUR, MD  Reason for Consultation:  Family history of colon cancer, history of colon polyps  IMPRESSION:  Family history of colon cancer (mother in her 12s) History of colon polyps Chronic warfarin use Moderate-to-severe aortic insufficiency  Patient is currently due for follow-up surveillance colonoscopy due to history of colon polyps and family history of colon cancer.  She is currently undergoing evaluation for moderate to severe aortic insufficiency.  Recently just got another echo earlier this month and is scheduled for a RHC/LHC in mid-February to see if she would qualify for an aortic valve replacement.  Thus we will plan to do her colonoscopy in the hospital and will plan to get cardiac clearance for this procedure.  PLAN: - Colonoscopy in the hospital. Will need warfarin held for 5 days beforehand so will check with her PCP Dr. Charlett. Will check with cardiology to get cardiac clearance  HPI: [name] Foster is a 74 y.o. female with history of aortic insufficiency, prior breast cancer, DVT on warfarin, breast cancer presents to discuss family history of colon cancer and polyp surveillance.    Patient presents to discuss getting a follow-up colonoscopy.  Her last colonoscopy was in 2022 with removal of 8 small tubular adenomas at that time. She currently denies any rectal bleeding, changes in bowel habits, unintentional weight loss, diarrhea, constipation, nausea, vomiting, acid reflux, abdominal pain, dysphagia.  Recently she has been following with her cardiologist regarding dyspnea on exertion.  Denies chest pain.  Mother with colon cancer in her 37s. Maternal first cousin with esophageal cancer in her late 63s.  Labs 08/2024: CBC nml. CMP with elevated Cr of 1.7, low potassium of 3.3 and elevated alk phos of 146  Labs 09/2024: TSH nml.   MRI of the abdomen with and without contrast 12/01/22: IMPRESSION: 1. Significant interval  enlargement of an exophytic contrast enhancing mass arising from the posterior superior pole of the left kidney measuring 2.1 x 1.9 cm, previously 1.3 x 1.3 cm when measured similarly, consistent with a slowly enlarging renal cell carcinoma. 2. No evidence of renal vein invasion, lymphadenopathy, or abdominal metastatic disease. 3. Multiple additional simple and hemorrhagic or proteinaceous cysts, Bosniak category I and II, for which no specific further follow-up or characterization is required. 4. Small hiatal hernia.  Prior colonoscopy with Dr. Donnald in 2005 revealed a hyperplastic polyp (path results but not procedure note are available in EPIC).   Colonoscopy 05/10/21: - Non- bleeding internal hemorrhoids. - Diverticulosis in the sigmoid colon. - Two 2 to 4 mm polyps in the descending colon, removed with a cold snare. Resected and retrieved. - One 3 mm polyp in the transverse colon, removed with a cold snare. Resected and retrieved. - Three 3 to 6 mm polyps at the hepatic flexure, removed with a cold snare. Resected and retrieved. - Two 3 to 5 mm polyps in the ascending colon, removed with a cold snare. Resected and retrieved. Path: 1. Surgical [P], colon, ascending, polyp (2) - TUBULAR ADENOMA(S). - NO HIGH GRADE DYSPLASIA OR CARCINOMA. 2. Surgical [P], colon, hepatic flexure, polyp (3) - TUBULAR ADENOMA(S). - NO HIGH GRADE DYSPLASIA OR CARCINOMA. 3. Surgical [P], colon, descending and transverse, polyp (3) - TUBULAR ADENOMA(S). - NO HIGH GRADE DYSPLASIA OR CARCINOMA   Past Medical History:  Diagnosis Date   Allergic rhinitis    Anemia    Aortic valve disorders    Arthritis    Asthma    Cancer (HCC)  Breast Cancer   CHF (congestive heart failure) (HCC)    Coronary artery disease    Family history of adverse reaction to anesthesia    difficulty waking mother up after surgery   Family history of breast cancer    Family history of cervical cancer    Family history of colon  cancer    Family history of throat cancer    Heart murmur    Hyperlipidemia    Hypertension    Intractable nausea and vomiting 09/21/2019   Long term (current) use of anticoagulants    Other primary cardiomyopathies    Peripheral vascular disease    right leg   Personal history of chemotherapy    Personal history of colonic polyps    Personal history of radiation therapy    Personal history of venous thrombosis and embolism     Past Surgical History:  Procedure Laterality Date   ABDOMINAL HYSTERECTOMY     BREAST BIOPSY Left 07/2019   CARDIAC CATHETERIZATION  2004   DILATION AND CURETTAGE OF UTERUS     several   MASTECTOMY MODIFIED RADICAL Left 03/09/2020   Left Modified Radical Mastectomy (left mastectomy with axillary lymph node dissection)    MASTECTOMY MODIFIED RADICAL Left 03/09/2020   Procedure: LEFT MODIFIED RADICAL MASTECTOMY;  Surgeon: Aron Shoulders, MD;  Location: [facility] OR;  Service: General;  Laterality: Left;  GEN AND PECTORAL BLOCK   PORT-A-CATH REMOVAL N/A 08/31/2020   Procedure: REMOVAL PORT-A-CATH;  Surgeon: Aron Shoulders, MD;  Location: [facility] OR;  Service: General;  Laterality: N/A;   PORTACATH PLACEMENT Left 08/18/2019   Procedure: INSERTION PORT-A-CATH;  Surgeon: Aron Shoulders, MD;  Location: [facility] OR;  Service: General;  Laterality: Left;   Rt knee arthoscopic     SHOULDER ARTHROSCOPY W/ ROTATOR CUFF REPAIR Right 07/2016   TEE WITHOUT CARDIOVERSION N/A 02/21/2013   Procedure: TRANSESOPHAGEAL ECHOCARDIOGRAM (TEE);  Surgeon: Redell GORMAN Shallow, MD;  Location: [facility] ENDOSCOPY;  Service: Cardiovascular;  Laterality: N/A;    Current Outpatient Medications  Medication Sig Dispense Refill   Albuterol  Sulfate (PROAIR  RESPICLICK) 108 (90 Base) MCG/ACT AEPB Inhale 2 puffs into the lungs every 6 (six) hours as needed. 2 each 1   amLODipine  (NORVASC ) 10 MG tablet TAKE 1 TABLET BY MOUTH DAILY 100 tablet 2   atorvastatin  (LIPITOR) 20 MG tablet TAKE 1 TABLET BY MOUTH ONCE  DAILY 100  tablet 2   Carboxymethylcellul-Glycerin  (CLEAR EYES FOR DRY EYES) 1-0.25 % SOLN Place 1 drop into both eyes 3 (three) times daily as needed (dry eyes).      Cholecalciferol  (VITAMIN D ) 50 MCG (2000 UT) tablet Take 2,000 Units by mouth daily.     fexofenadine  (ALLEGRA ) 180 MG tablet Take 180 mg by mouth daily as needed for allergies.      fluticasone  (FLONASE ) 50 MCG/ACT nasal spray USE 2 SPRAYS IN EACH NOSTRIL DAILY. PT NEEDS TO SCHEDULE A FOLLOW UP APPT BEFORE NEXT REFILL. 16 g 0   furosemide  (LASIX ) 40 MG tablet Take 1 tablet (40 mg total) by mouth daily. 90 tablet 3   hydrALAZINE  (APRESOLINE ) 50 MG tablet Take 1 tablet (50 mg total) by mouth 3 (three) times daily. 300 tablet 2   metoprolol  tartrate (LOPRESSOR ) 50 MG tablet TAKE 1 TABLET BY MOUTH TWICE  DAILY 200 tablet 2   Na Sulfate-K Sulfate-Mg Sulfate concentrate (SUPREP) 17.5-3.13-1.6 GM/177ML SOLN Take 1 kit (354 mLs total) by mouth as directed. 354 mL 0   potassium chloride  SA (KLOR-CON  M) 20 MEQ tablet Take  1 tablet (20 mEq total) by mouth daily. 90 tablet 3   sodium chloride  (OCEAN) 0.65 % nasal spray Place into the nose as needed.     warfarin (COUMADIN ) 5 MG tablet TAKE 1 TABLET BY MOUTH DAILY EXCEPT TAKE 1 AND 1/2 TABLETS BY MOUTH ON THURSDAYS OR AS  DIRECTED BY ANTICOAGULATION  CLINIC 110 tablet 1   No current facility-administered medications for this visit.    Allergies as of 10/29/2024 - Review Complete 10/29/2024  Allergen Reaction Noted   Penicillins Rash 06/26/2007   Tetanus toxoid Rash 06/26/2007   Aspirin Rash 06/26/2007    Family History  Problem Relation Age of Onset   Alcohol  abuse Other    Depression Other    Hyperlipidemia Other    Hypertension Other    Kidney disease Other    Colon cancer Mother        dx late 35s   Cervical cancer Maternal Grandmother    Stroke Paternal Grandmother    Breast cancer Maternal Aunt        dx 52s   Breast cancer Cousin        dx 54s    Physical Exam: Vitals:    10/29/24 0948  BP: (!) 150/78  Pulse: (!) 56   General:   Alert,  well-nourished, pleasant and cooperative in NAD Head:  Normocephalic and atraumatic. Eyes:  Sclera clear, no icterus.   Conjunctiva pink.   Lungs:  Clear throughout to auscultation.   No wheezes. Heart:  Regular rate and rhythm; no murmurs. Abdomen:  Soft,nontender, nondistended, normal bowel sounds, no rebound or guarding. No hepatosplenomegaly.   Msk:  Symmetrical. No boney deformities LAD: No inguinal or umbilical LAD Extremities:  Trace BLE edema Neurologic:  Alert and  oriented x4;  grossly nonfocal Skin:  Intact without significant lesions or rashes. Psych:  Alert and cooperative. Normal mood and affect.  Estefana Kidney, MD 10/29/2024, 10:43 AM  I spent 45 minutes of time, including in depth chart review, independent review of results as outlined above, communicating results with the patient directly, face-to-face time with the patient, coordinating care, ordering studies and medications as appropriate, and documentation.      "

## 2024-10-29 NOTE — Telephone Encounter (Signed)
 Colonoscopy was RS to 2/26 due to need to have surgery in hospital.  Cardiology is deferring risk assessment until after her TEE.   Pt was advised of this at her coumadin  clinic apt today. Also advised that Dr. Pietro recommends transitioning her to Eliquis if possible and not a financial burden.   Pt will contact her insurance and inquire cost and let either coumadin  clinic or cardiology know her decision.

## 2024-10-29 NOTE — Patient Instructions (Addendum)
 Pre visit review using our clinic review tool, if applicable. No additional management support is needed unless otherwise documented below in the visit note.  Continue 1 tablet daily except take 1 1/2 tablets on  Thursdays. Recheck in 4 weeks, approximately one week after cardiology surgery.

## 2024-10-29 NOTE — Patient Instructions (Signed)
 You have been scheduled for a colonoscopy. Please follow written instructions given to you at your visit today.   If you use inhalers (even only as needed), please bring them with you on the day of your procedure.  You will be contaced by our office prior to your procedure for directions on holding your Coumadin /Warfarin.  If you do not hear from our office 1 week prior to your scheduled procedure, please call [phone] to discuss. Ask for PJ, CMA  I appreciate the opportunity to care for you. Rosario Estefana Kidney, MD___________________________________________________________________________

## 2024-10-29 NOTE — Telephone Encounter (Signed)
 Forwarding note to Dr. Pietro for advisement.

## 2024-10-29 NOTE — Telephone Encounter (Signed)
Thanks for the input!

## 2024-10-29 NOTE — Telephone Encounter (Signed)
 I spoke with [name] Foster and she will get her instructions from the [facility]. She is going to double check with her ride about the date. She will call me back if any questions or issues arise.

## 2024-11-10 ENCOUNTER — Encounter: Admitting: Internal Medicine

## 2024-11-18 ENCOUNTER — Encounter (HOSPITAL_COMMUNITY): Payer: Self-pay

## 2024-11-18 ENCOUNTER — Ambulatory Visit (HOSPITAL_COMMUNITY): Admit: 2024-11-18 | Admitting: Cardiovascular Disease

## 2024-11-18 SURGERY — TRANSESOPHAGEAL ECHOCARDIOGRAM (TEE) (CATHLAB)
Anesthesia: Monitor Anesthesia Care

## 2024-11-18 SURGERY — RIGHT/LEFT HEART CATH AND CORONARY ANGIOGRAPHY
Anesthesia: LOCAL

## 2024-11-19 ENCOUNTER — Ambulatory Visit: Payer: Medicare Other | Admitting: Hematology and Oncology

## 2024-11-25 ENCOUNTER — Ambulatory Visit

## 2024-11-27 ENCOUNTER — Encounter (HOSPITAL_COMMUNITY): Payer: Self-pay

## 2024-11-27 ENCOUNTER — Ambulatory Visit (HOSPITAL_COMMUNITY): Admit: 2024-11-27 | Admitting: Internal Medicine
